# Patient Record
Sex: Female | Born: 1945 | Race: Asian | Hispanic: No | Smoking: Never smoker
Health system: Southern US, Community
[De-identification: ages and names within clinical notes are randomized; demographics above are authoritative.]

## PROBLEM LIST (undated history)

## (undated) DIAGNOSIS — E079 Disorder of thyroid, unspecified: Secondary | ICD-10-CM

## (undated) DIAGNOSIS — C801 Malignant (primary) neoplasm, unspecified: Secondary | ICD-10-CM

## (undated) DIAGNOSIS — H269 Unspecified cataract: Secondary | ICD-10-CM

## (undated) DIAGNOSIS — I10 Essential (primary) hypertension: Secondary | ICD-10-CM

## (undated) DIAGNOSIS — H409 Unspecified glaucoma: Secondary | ICD-10-CM

## (undated) DIAGNOSIS — E119 Type 2 diabetes mellitus without complications: Secondary | ICD-10-CM

## (undated) DIAGNOSIS — I4891 Unspecified atrial fibrillation: Secondary | ICD-10-CM

## (undated) HISTORY — DX: Disorder of thyroid, unspecified: E07.9

## (undated) HISTORY — DX: Unspecified glaucoma: H40.9

## (undated) HISTORY — DX: Unspecified cataract: H26.9

## (undated) HISTORY — DX: Essential (primary) hypertension: I10

## (undated) HISTORY — PX: HYSTEROSCOPY: SHX211

## (undated) HISTORY — DX: Malignant (primary) neoplasm, unspecified: C80.1

## (undated) HISTORY — PX: FOOT SURGERY: SHX648

---

## 1999-08-18 DIAGNOSIS — T148XXA Other injury of unspecified body region, initial encounter: Secondary | ICD-10-CM

## 1999-08-18 HISTORY — DX: Other injury of unspecified body region, initial encounter: T14.8XXA

## 2014-03-26 ENCOUNTER — Ambulatory Visit (INDEPENDENT_AMBULATORY_CARE_PROVIDER_SITE_OTHER): Payer: Medicare Other | Admitting: Gynecology

## 2014-03-26 ENCOUNTER — Encounter: Payer: Self-pay | Admitting: Gynecology

## 2014-03-26 ENCOUNTER — Other Ambulatory Visit (HOSPITAL_COMMUNITY)
Admission: RE | Admit: 2014-03-26 | Discharge: 2014-03-26 | Disposition: A | Discharge status: Home / Self Care | Payer: Medicare Other | Source: Ambulatory Visit | Attending: Gynecology | Admitting: Gynecology

## 2014-03-26 VITALS — BP 140/90 | Ht 59.75 in | Wt 234.0 lb

## 2014-03-26 DIAGNOSIS — E663 Overweight: Secondary | ICD-10-CM

## 2014-03-26 DIAGNOSIS — N840 Polyp of corpus uteri: Secondary | ICD-10-CM

## 2014-03-26 DIAGNOSIS — Z124 Encounter for screening for malignant neoplasm of cervix: Secondary | ICD-10-CM | POA: Insufficient documentation

## 2014-03-26 DIAGNOSIS — R87619 Unspecified abnormal cytological findings in specimens from cervix uteri: Secondary | ICD-10-CM | POA: Insufficient documentation

## 2014-03-26 DIAGNOSIS — Z1151 Encounter for screening for human papillomavirus (HPV): Secondary | ICD-10-CM | POA: Diagnosis present

## 2014-03-26 DIAGNOSIS — Z87410 Personal history of cervical dysplasia: Secondary | ICD-10-CM | POA: Insufficient documentation

## 2014-03-26 DIAGNOSIS — N950 Postmenopausal bleeding: Secondary | ICD-10-CM | POA: Insufficient documentation

## 2014-03-26 NOTE — Progress Notes (Signed)
Crystal Ashley 01-Apr-1946 [id number]   History:    68 y.o.  who is new to the practice. Patient is from [place] and there appears to be language barrier. She did bring some lab work with her. It appears that in 2014 with [place] as a result of a Pap smear done on February 2014 had demonstrated atypical glandular cells not otherwise specified are present, favor endometrial origin was the pathological description. Patient had had an ultrasound as well because of postmenopausal bleeding and reported stated that she had a thickened endometrium 1.5 cm thick. Both ovaries were not visualized and there was no adnexal masses. Her gynecologist took her to the operating room on 11/09/2012 whereby patient underwent a fractional dilatation and curettage along with hysteroscopy and removal of endometrial polyp. Pathology report as follows:  Endocervical scrapings: Scant amount of mucus containing 2 benign-appearing O. cells and occasional submucosal tissue fragment Endometrial polyp: Small benign endometrial polyp Endometrial scrapings: Complex endometrial hyperplasia without atypia and squamous Memorial's and areas of lytic change and hemorrhage and the final comment was that there was extensive complex hyperplasia in the curettings but did not see any definite malignant change.  Patient has had no further followup and is here because she has postmenopausal bleeding at times for the past year. She travels from here to [place] and to [place]. She stated that she had her blood work done they are but her mammogram was normal this year and [place]. She has never had a colonoscopy. Patient has never had any children in the past.  It appears from her medication list that her primary physician is treating her for her hypertension as well as for hypothyroidism.  Past medical history,surgical history, family history and social history were all reviewed and documented in the EPIC chart.  Gynecologic  History No LMP recorded. Patient is postmenopausal. Contraception: post menopausal status Last Pap: See above. Results were: See above Last mammogram: Normal. Results were: Patient reports normal report from this year  Obstetric History OB History  Gravida Para Term Preterm AB SAB TAB Ectopic Multiple Living  0                  ROS: A ROS was performed and pertinent positives and negatives are included in the history.  GENERAL: No fevers or chills. HEENT: No change in vision, no earache, sore throat or sinus congestion. NECK: No pain or stiffness. CARDIOVASCULAR: No chest pain or pressure. No palpitations. PULMONARY: No shortness of breath, cough or wheeze. GASTROINTESTINAL: No abdominal pain, nausea, vomiting or diarrhea, melena or bright red blood per rectum. GENITOURINARY: No urinary frequency, urgency, hesitancy or dysuria. MUSCULOSKELETAL: No joint or muscle pain, no back pain, no recent trauma. DERMATOLOGIC: No rash, no itching, no lesions. ENDOCRINE: No polyuria, polydipsia, no heat or cold intolerance. No recent change in weight. HEMATOLOGICAL: Vaginal bleeding NEUROLOGIC: No headache, seizures, numbness, tingling or weakness. PSYCHIATRIC: No depression, no loss of interest in normal activity or change in sleep pattern.     Exam: chaperone present  BP 140/90  Ht 4' 11.75" (1.518 m)  Wt 234 lb (106.142 kg)  BMI 46.06 kg/m2  Body mass index is 46.06 kg/(m^2).  General appearance : Morbidly obese No acute distress HEENT: Neck supple, trachea midline, no carotid bruits, no thyroidmegaly Lungs: Clear to auscultation, no rhonchi or wheezes, or rib retractions  Heart: Regular rate and rhythm, no murmurs or gallops Breast:Examined in sitting and supine position were symmetrical in appearance, no palpable  masses or tenderness,  no skin retraction, no nipple inversion, no nipple discharge, no skin discoloration, no axillary or supraclavicular lymphadenopathy Abdomen: Protuberant  abdomen  Extremities: no edema or skin discoloration or tenderness  Pelvic:  Bartholin, Urethra, Skene Glands: Within normal limits             Vagina: No gross lesions or discharge  Cervix: No gross lesions or discharge  Uterus  difficult to assess due to abdominal girth  Adnexa  same as above  Anus and perineum  normal   Rectovaginal  normal sphincter tone without palpated masses or tenderness             Hemoccult not done     Assessment/Plan:  68 y.o. female morbidly obese who last year and [place] had been taken to the operating room for fractional dilatation and curettage with hysteroscopy and removal of endometrial polyp. Preop evaluation had consisted of an ultrasound demonstrated endometrial thickness and Pap smear having demonstrated atypical glandular cells of undetermined significance. The pathology report from the [facility] had demonstrated small benign endometrial polyp along with complex endometrial hyperplasia without atypia. Patient had no followup until now. A Pap smear was done today as well as an endometrial biopsy. Patient returned back to the office next week for sonohysterogram. She was reminded of the importance to schedule a colonoscopy this year.   Note: This dictation was prepared with  Dragon/digital dictation along withSmart phrase technology. Any transcriptional errors that result from this process are unintentional.   Terrance Mass MD, 4:14 PM 03/26/2014

## 2014-03-26 NOTE — Addendum Note (Signed)
Addended by: Thurnell Garbe A on: 03/26/2014 04:28 PM   Modules accepted: Orders

## 2014-03-26 NOTE — Patient Instructions (Signed)
Endometrial Biopsy Endometrial biopsy is a procedure in which a tissue sample is taken from inside the uterus. The tissue sample is then looked at under a microscope to see if the tissue is normal or abnormal. The endometrium is the lining of the uterus. This procedure helps determine where you are in your menstrual cycle and how hormone levels are affecting the lining of the uterus. This procedure may also be used to evaluate uterine bleeding or to diagnose endometrial cancer, tuberculosis, polyps, or inflammatory conditions.  LET YOUR HEALTH CARE PROVIDER KNOW ABOUT:  Any allergies you have.  All medicines you are taking, including vitamins, herbs, eye drops, creams, and over-the-counter medicines.  Previous problems you or members of your family have had with the use of anesthetics.  Any blood disorders you have.  Previous surgeries you have had.  Medical conditions you have.  Possibility of pregnancy. RISKS AND COMPLICATIONS Generally, this is a safe procedure. However, as with any procedure, complications can occur. Possible complications include:  Bleeding.  Pelvic infection.  Puncture of the uterine wall with the biopsy device (rare). BEFORE THE PROCEDURE   Keep a record of your menstrual cycles as directed by your health care provider. You may need to schedule your procedure for a specific time in your cycle.  You may want to bring a sanitary pad to wear home after the procedure.  Arrange for someone to drive you home after the procedure if you will be given a medicine to help you relax (sedative). PROCEDURE   You may be given a sedative to relax you.  You will lie on an exam table with your feet and legs supported as in a pelvic exam.  Your health care provider will insert an instrument (speculum) into your vagina to see your cervix.  Your cervix will be cleansed with an antiseptic solution. A medicine (local anesthetic) will be used to numb the cervix.  A forceps  instrument (tenaculum) will be used to hold your cervix steady for the biopsy.  A thin, rodlike instrument (uterine sound) will be inserted through your cervix to determine the length of your uterus and the location where the biopsy sample will be removed.  A thin, flexible tube (catheter) will be inserted through your cervix and into the uterus. The catheter is used to collect the biopsy sample from your endometrial tissue.  The catheter and speculum will then be removed, and the tissue sample will be sent to a lab for examination. AFTER THE PROCEDURE  You will rest in a recovery area until you are ready to go home.  You may have mild cramping and a small amount of vaginal bleeding for a few days after the procedure. This is normal.  Make sure you find out how to get your test results. Document Released: 12/04/2004 Document Revised: 04/05/2013 Document Reviewed: 01/18/2013 ExitCare Patient Information 2015 [employer]. This information is not intended to replace advice given to you by your health care provider. Make sure you discuss any questions you have with your health care provider. Transvaginal Ultrasound Transvaginal ultrasound is a pelvic ultrasound, using a metal probe that is placed in the vagina, to look at a women's female organs. Transvaginal ultrasound is a method of seeing inside the pelvis of a woman. The ultrasound machine sends out sound waves from the transducer (probe). These sound waves bounce off body structures (like an echo) to create a picture. The picture shows up on a monitor. It is called transvaginal because the probe is   inserted into the vagina. There should be very little discomfort from the vaginal probe. This test can also be used during pregnancy. Endovaginal ultrasound is another name for a transvaginal ultrasound. In a transabdominal ultrasound, the probe is placed on the outside of the belly. This method gives pictures that are lower quality than pictures  from the transvaginal technique. Transvaginal ultrasound is used to look for problems of the female genital tract. Some such problems include:  Infertility problems.  Congenital (birth defect) malformations of the uterus and ovaries.  Tumors in the uterus.  Abnormal bleeding.  Ovarian tumors and cysts.  Abscess (inflamed tissue around pus) in the pelvis.  Unexplained abdominal or pelvic pain.  Pelvic infection. DURING PREGNANCY, TRANSVAGINAL ULTRASOUND MAY BE USED TO LOOK AT:  Normal pregnancy.  Ectopic pregnancy (pregnancy outside the uterus).  Fetal heartbeat.  Abnormalities in the pelvis, that are not seen well with transabdominal ultrasound.  Suspected twins or multiples.  Impending miscarriage.  Problems with the cervix (incompetent cervix, not able to stay closed and hold the baby).  When doing an amniocentesis (removing fluid from the pregnancy sac, for testing).  Looking for abnormalities of the baby.  Checking the growth, development, and age of the fetus.  Measuring the amount of fluid in the amniotic sac.  When doing an external version of the baby (moving baby into correct position).  Evaluating the baby for problems in high risk pregnancies (biophysical profile).  Suspected fetal demise (death). Sometimes a special ultrasound method called Saline Infusion Sonography (SIS) is used for a more accurate look at the uterus. Sterile saline (salt water) is injected into the uterus of non-pregnant patients to see the inside of the uterus better. SIS is not used on pregnant women. The vaginal probe can also assist in obtaining biopsies of abnormal areas, in draining fluid from cysts on the ovary, and in finding IUDs (intrauterine device, birth control) that cannot be located. PREPARATION FOR TEST A transvaginal ultrasound is done with the bladder empty. The transabdominal ultrasound is done with your bladder full. You may be asked to drink several glasses of water  before that exam. Sometimes, a transabdominal ultrasound is done just after a transvaginal ultrasound, to look at organs in your abdomen. PROCEDURE  You will lie down on a table, with your knees bent and your feet in foot holders. The probe is covered with a condom. A sterile lubricant is put into the vagina and on the probe. The lubricant helps transmit the sound waves and avoid irritating the vagina. Your caregiver will move the probe inside the vaginal cavity to scan the pelvic structures. A normal test will show a normal pelvis and normal contents. An abnormal test will show abnormalities of the pelvis, placenta, or baby. ABNORMAL RESULTS MAY BE DUE TO:  Growths or tumors in the:  Uterus.  Ovaries.  Vagina.  Other pelvic structures.  Non-cancerous growths of the uterus and ovaries.  Twisting of the ovary, cutting off blood supply to the ovary (ovarian torsion).  Areas of infection, including:  Pelvic inflammatory disease.  Abscess in the pelvis.  Locating an IUD. PROBLEMS FOUND IN PREGNANT WOMEN MAY INCLUDE:  Ectopic pregnancy (pregnancy outside the uterus).  Multiple pregnancies.  Early dilation (opening) of the cervix. This may indicate an incompetent cervix and early delivery.  Impending miscarriage.  Fetal death.  Problems with the placenta, including:  Placenta has grown over the opening of the womb (placenta previa).  Placenta has separated early in the womb (placental   abruption).  Placenta grows into the muscle of the uterus (placenta accreta).  Tumors of pregnancy, including gestational trophoblastic disease. This is an abnormal pregnancy, with no fetus. The uterus is filled with many grape-like cysts that could sometimes be cancerous.  Incorrect position of the fetus (breech, vertex).  Intrauterine fetal growth retardation (IUGR) (poor growth in the womb).  Fetal abnormalities or infection. RISKS AND COMPLICATIONS There are no known risks to the  ultrasound procedure. There is no X-ray used when doing an ultrasound. Document Released: 07/15/2004 Document Revised: 10/26/2011 Document Reviewed: 07/03/2009 ExitCare Patient Information 2015 [employer]. This information is not intended to replace advice given to you by your health care provider. Make sure you discuss any questions you have with your health care provider.  

## 2014-03-28 LAB — CYTOLOGY - PAP

## 2014-03-29 ENCOUNTER — Telehealth: Payer: Self-pay | Admitting: Gynecology

## 2014-03-29 ENCOUNTER — Telehealth: Payer: Self-pay | Admitting: *Deleted

## 2014-03-29 NOTE — Telephone Encounter (Signed)
Appointment on 03/30/14 @ 8:15 am with Dr.Clarke-Pearson at [facility]. Left message for pt to call.

## 2014-03-29 NOTE — Telephone Encounter (Signed)
Message copied by Thamas Jaegers on Thu Mar 29, 2014 10:58 AM ------      Message from: Terrance Mass      Created: Thu Mar 29, 2014  9:48 AM       Anderson Malta please contact GYN oncology group for consultation for this patient with endometrial cancer. The patient was previously scheduled to come back to the office for a sonohysterogram please cancel that. I have spoken with her husband who is a retired cardiologist today since the patient was sleeping and gave them the information and explained to her and that you would be calling to coordinate the consultation with the GYN oncologist.            Patient's husband is a retired Film/video editor and I was able to give him in detail explanation of the findings. Patient before the pathology report came back was going to be scheduled for a sonohysterogram since she has had history of benign endometrial polyps in the past as well as history of extensive complex hyperplasia without atypia. We will cancel that appointment and make arrangements for her to see our GYN oncologist from [facility] to schedule consultation for definitive surgery such as with robotic total hysterectomy with bilateral salpingo-oophorectomy and staging procedure. ------

## 2014-03-29 NOTE — Telephone Encounter (Signed)
Spoke with patient's husband today since she was still sleeping in reference to patient's recent endometrial biopsy that was done in the office on August 10 as a result of postmenopausal bleeding. 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 Diagnosis Note These findings correlate with those of the pap smear ([id number]). Dr. Saralyn Pilar has reviewed the case. Dr. Toney Rakes was notified 03/29/2014.  Patient's husband is a retired Film/video editor and I was able to give him in detail explanation of the findings. Patient before the pathology report came back was going to be scheduled for a sonohysterogram since she has had history of benign endometrial polyps in the past as well as history of extensive complex hyperplasia without atypia. We will cancel that appointment and make arrangements for her to see our GYN oncologist from [facility] to schedule consultation for definitive surgery such as with robotic total hysterectomy with bilateral salpingo-oophorectomy and staging procedure.

## 2014-03-29 NOTE — Telephone Encounter (Signed)
Pt husband informed with the below note.

## 2014-03-30 ENCOUNTER — Telehealth: Payer: Self-pay | Admitting: *Deleted

## 2014-03-30 ENCOUNTER — Ambulatory Visit: Payer: Medicare Other | Attending: Gynecology | Admitting: Gynecology

## 2014-03-30 ENCOUNTER — Encounter: Payer: Self-pay | Admitting: Gynecology

## 2014-03-30 VITALS — BP 150/83 | HR 69 | Temp 98.7°F | Resp 20 | Ht 59.75 in | Wt 231.9 lb

## 2014-03-30 DIAGNOSIS — C549 Malignant neoplasm of corpus uteri, unspecified: Secondary | ICD-10-CM

## 2014-03-30 DIAGNOSIS — Z79899 Other long term (current) drug therapy: Secondary | ICD-10-CM | POA: Insufficient documentation

## 2014-03-30 DIAGNOSIS — I10 Essential (primary) hypertension: Secondary | ICD-10-CM | POA: Diagnosis not present

## 2014-03-30 DIAGNOSIS — N898 Other specified noninflammatory disorders of vagina: Secondary | ICD-10-CM | POA: Diagnosis present

## 2014-03-30 DIAGNOSIS — Z8542 Personal history of malignant neoplasm of other parts of uterus: Secondary | ICD-10-CM | POA: Insufficient documentation

## 2014-03-30 DIAGNOSIS — C541 Malignant neoplasm of endometrium: Secondary | ICD-10-CM

## 2014-03-30 DIAGNOSIS — E039 Hypothyroidism, unspecified: Secondary | ICD-10-CM | POA: Diagnosis not present

## 2014-03-30 NOTE — Telephone Encounter (Signed)
Called pt with [facility] surgical date sept 3 with Dr. Clarene Essex. Pt advised Husband spoke with Gwen at [facility] and has a pre op appt as well. No further concerns.

## 2014-03-30 NOTE — Progress Notes (Signed)
Consult Note: Gyn-Onc   Crystal Ashley 68 y.o. female  Chief Complaint  Patient presents with  . Vaginal Bleeding    Referring physician: Uvaldo Rising, M.D.  Assessment : grade 1 endometrial carcinoma. The natural history and her pubic options were discussed with the patient and her husband. They would recommend initial therapy with a robotic hysterectomy bilateral salpingo-oophorectomy and intraoperative frozen section which would provide guidance as to whether lymphadenectomy would be appropriate.  Plan: A robotic hysterectomy will be scheduled at [facility] as the patient and her husband wish to proceed with surgery sooner than we're able to perform adhering [facility].  HPI: 69 year old married female gravida 0 seen in consultation request of Dr. Uvaldo Rising regarding management of a newly diagnosed grade 1 endometrial adenocarcinoma.  The patient has had intermittent postmenopausal bleeding for the last several years. Previously in 2014 she underwent a fractional D&C which revealed an endometrial polyp and complex hyperplasia without atypia. She then presented to Dr. Toney Rakes on 03/26/2014 with continued bleeding. An endometrial biopsy was performed at that time revealing a grade 1 endometrial carcinoma. The patient denies any past gynecologic history. She currently denies any pelvic pain pressure or other pelvic symptoms. She denies any GI or GU symptoms.  Medical history is relevant for hypertension and hypothyroidism she's never had any abdominal surgery.  It's noted that the patient's husband is retired Film/video editor. They own a [employer] in [place] of Systems:10 point review of systems is negative except as noted in interval history.   Vitals: Blood pressure 150/83, pulse 69, temperature 98.7 F (37.1 C), temperature source Oral, resp. rate 20, height 4' 11.75" (1.518 m), weight 231 lb 14.4 oz (105.189 kg).  Physical Exam: General : The patient is a  healthy woman in no acute distress. Moderately obese. HEENT: normocephalic, extraoccular movements normal; neck is supple without thyromegally  Lynphnodes: Supraclavicular and inguinal nodes not enlarged  Abdomen: Soft, non-tender, no ascites, no organomegally, no masses, no hernias  Pelvic:  EGBUS: Normal female  Vagina: Normal, no lesions  Urethra and Bladder: Normal, non-tender  Cervix: Normal. No lesions are noted. Uterus: Difficult to assess given the patient's habitus. I do not get any impression that is enlarged, however. Bi-manual examination: Non-tender; no adenxal masses or nodularity  Rectal: normal sphincter tone, no masses, no blood  Lower extremities: No edema or varicosities. Normal range of motion      No Known Allergies  Past Medical History  Diagnosis Date  . Hypertension     Past Surgical History  Procedure Laterality Date  . Hysteroscopy      polypectomy  . Foot surgery Left     Current Outpatient Prescriptions  Medication Sig Dispense Refill  . amLODipine (NORVASC) 5 MG tablet Take 5 mg by mouth daily.      Marland Kitchen atenolol (TENORMIN) 50 MG tablet Take 50 mg by mouth daily.      . cholecalciferol (VITAMIN D) 1000 UNITS tablet Take 1,000 Units by mouth daily.      . furosemide (LASIX) 40 MG tablet Take 40 mg by mouth.      . levothyroxine (SYNTHROID) 25 MCG tablet Take 25 mcg by mouth daily before breakfast.      . valsartan (DIOVAN) 160 MG tablet Take 160 mg by mouth daily.       No current facility-administered medications for this visit.    History   Social History  . Marital Status: Married    Spouse Name: N/A  Number of Children: N/A  . Years of Education: N/A   Occupational History  . Not on file.   Social History Main Topics  . Smoking status: Never Smoker   . Smokeless tobacco: Never Used  . Alcohol Use: No  . Drug Use: Not on file  . Sexual Activity: Yes   Other Topics Concern  . Not on file   Social History Narrative  . No  narrative on file    Family History  Problem Relation Age of Onset  . Diabetes Son       Alvino Chapel, MD 03/30/2014, 10:17 AM

## 2014-04-19 DIAGNOSIS — I10 Essential (primary) hypertension: Secondary | ICD-10-CM

## 2014-04-19 DIAGNOSIS — E039 Hypothyroidism, unspecified: Secondary | ICD-10-CM | POA: Insufficient documentation

## 2014-04-19 DIAGNOSIS — I152 Hypertension secondary to endocrine disorders: Secondary | ICD-10-CM | POA: Insufficient documentation

## 2014-04-19 DIAGNOSIS — E119 Type 2 diabetes mellitus without complications: Secondary | ICD-10-CM | POA: Insufficient documentation

## 2014-04-19 DIAGNOSIS — E1159 Type 2 diabetes mellitus with other circulatory complications: Secondary | ICD-10-CM | POA: Insufficient documentation

## 2014-04-25 ENCOUNTER — Other Ambulatory Visit: Payer: Medicare Other

## 2014-04-25 ENCOUNTER — Ambulatory Visit: Payer: Medicare Other | Admitting: Gynecology

## 2014-05-28 ENCOUNTER — Other Ambulatory Visit: Payer: Medicare Other

## 2014-05-28 ENCOUNTER — Ambulatory Visit: Payer: Medicare Other | Admitting: Gynecology

## 2014-08-13 ENCOUNTER — Ambulatory Visit: Payer: Medicare Other | Admitting: Gynecology

## 2014-08-23 DIAGNOSIS — R609 Edema, unspecified: Secondary | ICD-10-CM | POA: Diagnosis not present

## 2014-08-23 DIAGNOSIS — E559 Vitamin D deficiency, unspecified: Secondary | ICD-10-CM | POA: Diagnosis not present

## 2014-08-23 DIAGNOSIS — E785 Hyperlipidemia, unspecified: Secondary | ICD-10-CM | POA: Diagnosis not present

## 2014-08-23 DIAGNOSIS — I10 Essential (primary) hypertension: Secondary | ICD-10-CM | POA: Diagnosis not present

## 2014-08-23 DIAGNOSIS — E039 Hypothyroidism, unspecified: Secondary | ICD-10-CM | POA: Diagnosis not present

## 2014-08-31 ENCOUNTER — Encounter: Payer: Self-pay | Admitting: Gynecologic Oncology

## 2014-08-31 ENCOUNTER — Ambulatory Visit: Payer: Medicare Other | Attending: Gynecologic Oncology | Admitting: Gynecologic Oncology

## 2014-08-31 VITALS — BP 162/71 | HR 77 | Temp 98.4°F | Resp 18 | Ht 59.75 in | Wt 233.1 lb

## 2014-08-31 DIAGNOSIS — C541 Malignant neoplasm of endometrium: Secondary | ICD-10-CM | POA: Diagnosis not present

## 2014-08-31 DIAGNOSIS — Z79899 Other long term (current) drug therapy: Secondary | ICD-10-CM | POA: Diagnosis not present

## 2014-08-31 DIAGNOSIS — Z8542 Personal history of malignant neoplasm of other parts of uterus: Secondary | ICD-10-CM | POA: Diagnosis not present

## 2014-08-31 DIAGNOSIS — I10 Essential (primary) hypertension: Secondary | ICD-10-CM | POA: Diagnosis not present

## 2014-08-31 NOTE — Progress Notes (Signed)
ENDOMETRIAL CANCER FOLLOWUP  HPI:  Crystal Ashley is a 69 y.o. year old G0P0 initially seen in consultation on 03/30/14 referred by Dr Uvaldo Rising for grade 1 endometrial cancer.  She then underwent a robotic hysterectomy, BSO, pelvic lymphadenectomy on 04/20/14 with Dr Jannifer Rodney in [facility] without complications.  Her postoperative course was uncomplicated.  Her final pathologic diagnosis is a Stage IA Grade 1 endometrioid endometrial cancer with no lymphovascular space invasion, no myometrial invasion and negative lymph nodes.  She is seen today for a surveillance visit.  She has normal appetite, normal bowel and bladder function, and no pain. She has no other complaints today. She denies symptoms concerning for recurrence including: vaginal bleeding, pelvic pain or pressure, abdominal pain, change in bladder or bowel habit, new lower extremity edema, cough, early satiety, nausea or emesis).  No Known Allergies  Current Outpatient Prescriptions on File Prior to Visit  Medication Sig Dispense Refill  . amLODipine (NORVASC) 5 MG tablet Take 5 mg by mouth daily.    Marland Kitchen atenolol (TENORMIN) 50 MG tablet Take 50 mg by mouth daily.    . cholecalciferol (VITAMIN D) 1000 UNITS tablet Take 1,000 Units by mouth daily.    . furosemide (LASIX) 40 MG tablet Take 40 mg by mouth daily.     . valsartan (DIOVAN) 160 MG tablet Take 160 mg by mouth daily.     No current facility-administered medications on file prior to visit.    Past Medical History  Diagnosis Date  . Hypertension     Past Surgical History  Procedure Laterality Date  . Hysteroscopy      polypectomy  . Foot surgery Left   hysterectomy, bso, pelvic lymphadenectomy 04/2014 for endometrial cancer  History   Social History  . Marital Status: Married    Spouse Name: N/A    Number of Children: N/A  . Years of Education: N/A   Occupational History  . Not on file.   Social History Main Topics  . Smoking status: Never Smoker   .  Smokeless tobacco: Never Used  . Alcohol Use: No  . Drug Use: Not on file  . Sexual Activity: Yes   Other Topics Concern  . Not on file   Social History Narrative    Family History  Problem Relation Age of Onset  . Diabetes Son      Review of systems: Constitutional:  She has no weight gain or weight loss. She has no fever or chills. Eyes: No blurred vision Ears, Nose, Mouth, Throat: No dizziness, headaches or changes in hearing. No mouth sores. Cardiovascular: No chest pain, palpitations or edema. Respiratory:  No shortness of breath, wheezing or cough Gastrointestinal: She has normal bowel movements without diarrhea or constipation. She denies any nausea or vomiting. She denies blood in her stool or heart burn. Genitourinary:  She denies pelvic pain, pelvic pressure or changes in her urinary function. She has no hematuria, dysuria, or incontinence. She has no irregular vaginal bleeding or vaginal discharge Musculoskeletal: Denies muscle weakness or joint pains.  Skin:  She has no skin changes, rashes or itching Neurological:  Denies dizziness or headaches. No neuropathy, no numbness or tingling. Psychiatric:  She denies depression or anxiety. Hematologic/Lymphatic:   No easy bruising or bleeding   Physical Exam: Blood pressure 162/71, pulse 77, temperature 98.4 F (36.9 C), temperature source Oral, resp. rate 18, height 4' 11.75" (1.518 m), weight 233 lb 1.6 oz (105.733 kg). General: Well dressed, well nourished in no apparent  distress.   HEENT:  Normocephalic and atraumatic, no lesions.  Extraocular muscles intact. Sclerae anicteric. Pupils equal, round, reactive. No mouth sores or ulcers. Thyroid is normal size, not nodular, midline. Skin:  No lesions or rashes. Breasts:  Soft, symmetric.  No skin or nipple changes.  No palpable LN or masses. Lungs:  Clear to auscultation bilaterally.  No wheezes. Cardiovascular:  Regular rate and rhythm.  No murmurs or rubs. Abdomen:   Soft, nontender, nondistended.  No palpable masses.  No hepatosplenomegaly.  No ascites. Normal bowel sounds.  No hernias.  Incisions are well healed  Genitourinary: Normal EGBUS  Vaginal cuff intact.  No bleeding or discharge.  No cul de sac fullness. Extremities: No cyanosis, clubbing or edema.  No calf tenderness or erythema. No palpable cords. Psychiatric: Mood and affect are appropriate. Neurological: Awake, alert and oriented x 3. Sensation is intact, no neuropathy.  Musculoskeletal: No pain, normal strength and range of motion.  Assessment:    69 y.o. year old with Stage IA Grade 1 endometrioid endometrial cancer.   S/p robotic hysterectomy and staging on 04/20/14. No  LVSI, no myometrial invasion, negative pelvic washings and negative lymph nodes.   Plan: 1) NED today 2) Treatment counseling - Very low risk (<5%) for recurrence given age, grade, depth of myometrial invasion and LVSI status. Multidisciplinary tumor board recommendation is for routine surveillance with frequent pelvic exams and visits with annual pap smear.  We recommend examinations every 6 months for 4 more years, at which time she can return to annual visits.  Discussed signs and symptoms of recurrence including vaginal bleeding or discharge, leg pain or swelling and changes in bowel or bladder habits. She was given the opportunity to ask questions, which were answered to her satisfaction, and she is agreement with the above mentioned plan of care.  3)  Return to clinic in 6 months to see Dr Toney Rakes, and in 1 year to see [place] in the cancer center.  Donaciano Eva, MD

## 2014-08-31 NOTE — Patient Instructions (Addendum)
Followup with Dr. Toney Rakes in July 2016   Followup with Dr. Denman George in January 2017. Please call GYN oncology clinic (phone number: [phone]) in November to schedule this appointment please.  Please call us sooner if you have any questions or concerns.

## 2014-09-05 DIAGNOSIS — R7989 Other specified abnormal findings of blood chemistry: Secondary | ICD-10-CM | POA: Diagnosis not present

## 2014-09-05 DIAGNOSIS — E785 Hyperlipidemia, unspecified: Secondary | ICD-10-CM | POA: Diagnosis not present

## 2014-09-05 DIAGNOSIS — E039 Hypothyroidism, unspecified: Secondary | ICD-10-CM | POA: Diagnosis not present

## 2014-09-05 DIAGNOSIS — I10 Essential (primary) hypertension: Secondary | ICD-10-CM | POA: Diagnosis not present

## 2014-09-05 DIAGNOSIS — R601 Generalized edema: Secondary | ICD-10-CM | POA: Diagnosis not present

## 2014-09-05 DIAGNOSIS — E559 Vitamin D deficiency, unspecified: Secondary | ICD-10-CM | POA: Diagnosis not present

## 2014-10-25 DIAGNOSIS — I10 Essential (primary) hypertension: Secondary | ICD-10-CM | POA: Diagnosis not present

## 2014-10-25 DIAGNOSIS — E559 Vitamin D deficiency, unspecified: Secondary | ICD-10-CM | POA: Diagnosis not present

## 2014-10-25 DIAGNOSIS — E039 Hypothyroidism, unspecified: Secondary | ICD-10-CM | POA: Diagnosis not present

## 2014-10-25 DIAGNOSIS — R601 Generalized edema: Secondary | ICD-10-CM | POA: Diagnosis not present

## 2014-10-25 DIAGNOSIS — E785 Hyperlipidemia, unspecified: Secondary | ICD-10-CM | POA: Diagnosis not present

## 2014-12-25 DIAGNOSIS — E785 Hyperlipidemia, unspecified: Secondary | ICD-10-CM | POA: Diagnosis not present

## 2014-12-25 DIAGNOSIS — I10 Essential (primary) hypertension: Secondary | ICD-10-CM | POA: Diagnosis not present

## 2014-12-25 DIAGNOSIS — E559 Vitamin D deficiency, unspecified: Secondary | ICD-10-CM | POA: Diagnosis not present

## 2014-12-25 DIAGNOSIS — R601 Generalized edema: Secondary | ICD-10-CM | POA: Diagnosis not present

## 2014-12-25 DIAGNOSIS — E039 Hypothyroidism, unspecified: Secondary | ICD-10-CM | POA: Diagnosis not present

## 2015-01-15 DIAGNOSIS — H1045 Other chronic allergic conjunctivitis: Secondary | ICD-10-CM | POA: Diagnosis not present

## 2015-01-15 DIAGNOSIS — H35033 Hypertensive retinopathy, bilateral: Secondary | ICD-10-CM | POA: Diagnosis not present

## 2015-01-15 DIAGNOSIS — H2589 Other age-related cataract: Secondary | ICD-10-CM | POA: Diagnosis not present

## 2015-01-15 DIAGNOSIS — H01009 Unspecified blepharitis unspecified eye, unspecified eyelid: Secondary | ICD-10-CM | POA: Diagnosis not present

## 2015-01-15 DIAGNOSIS — I7090 Unspecified atherosclerosis: Secondary | ICD-10-CM | POA: Diagnosis not present

## 2015-01-15 DIAGNOSIS — H02839 Dermatochalasis of unspecified eye, unspecified eyelid: Secondary | ICD-10-CM | POA: Diagnosis not present

## 2015-01-29 DIAGNOSIS — J019 Acute sinusitis, unspecified: Secondary | ICD-10-CM | POA: Diagnosis not present

## 2015-01-29 DIAGNOSIS — J209 Acute bronchitis, unspecified: Secondary | ICD-10-CM | POA: Diagnosis not present

## 2015-03-05 DIAGNOSIS — E039 Hypothyroidism, unspecified: Secondary | ICD-10-CM | POA: Diagnosis not present

## 2015-03-05 DIAGNOSIS — E785 Hyperlipidemia, unspecified: Secondary | ICD-10-CM | POA: Diagnosis not present

## 2015-03-05 DIAGNOSIS — I10 Essential (primary) hypertension: Secondary | ICD-10-CM | POA: Diagnosis not present

## 2015-03-05 DIAGNOSIS — E559 Vitamin D deficiency, unspecified: Secondary | ICD-10-CM | POA: Diagnosis not present

## 2015-03-05 DIAGNOSIS — R601 Generalized edema: Secondary | ICD-10-CM | POA: Diagnosis not present

## 2015-04-05 DIAGNOSIS — I10 Essential (primary) hypertension: Secondary | ICD-10-CM | POA: Diagnosis not present

## 2015-04-05 DIAGNOSIS — L03115 Cellulitis of right lower limb: Secondary | ICD-10-CM | POA: Diagnosis not present

## 2015-04-05 DIAGNOSIS — E039 Hypothyroidism, unspecified: Secondary | ICD-10-CM | POA: Diagnosis not present

## 2015-04-05 DIAGNOSIS — L03116 Cellulitis of left lower limb: Secondary | ICD-10-CM | POA: Diagnosis not present

## 2015-04-05 DIAGNOSIS — I83819 Varicose veins of unspecified lower extremities with pain: Secondary | ICD-10-CM | POA: Diagnosis not present

## 2015-04-08 DIAGNOSIS — E669 Obesity, unspecified: Secondary | ICD-10-CM | POA: Diagnosis not present

## 2015-04-08 DIAGNOSIS — E119 Type 2 diabetes mellitus without complications: Secondary | ICD-10-CM | POA: Diagnosis not present

## 2015-04-08 DIAGNOSIS — E039 Hypothyroidism, unspecified: Secondary | ICD-10-CM | POA: Diagnosis not present

## 2015-04-08 DIAGNOSIS — I10 Essential (primary) hypertension: Secondary | ICD-10-CM | POA: Diagnosis not present

## 2015-04-08 LAB — HEMOGLOBIN A1C: Hemoglobin A1C: 6.5

## 2015-04-08 LAB — CBC AND DIFFERENTIAL
HEMATOCRIT: 41 % (ref 36–46)
Hemoglobin: 13.3 g/dL (ref 12.0–16.0)
PLATELETS: 227 10*3/uL (ref 150–399)
WBC: 7.1 10^3/mL

## 2015-04-08 LAB — BASIC METABOLIC PANEL
BUN: 15 mg/dL (ref 4–21)
Creatinine: 0.7 mg/dL (ref 0.5–1.1)
GLUCOSE: 143 mg/dL
Potassium: 4.1 mmol/L (ref 3.4–5.3)
SODIUM: 139 mmol/L (ref 137–147)

## 2015-04-08 LAB — HEPATIC FUNCTION PANEL
ALK PHOS: 80 U/L (ref 25–125)
ALT: 32 U/L (ref 7–35)
AST: 30 U/L (ref 13–35)
Bilirubin, Total: 0.5 mg/dL

## 2015-04-08 LAB — LIPID PANEL
Cholesterol: 195 mg/dL (ref 0–200)
HDL: 40 mg/dL (ref 35–70)
LDL CALC: 123 mg/dL
LDl/HDL Ratio: 3.1
Triglycerides: 161 mg/dL — AB (ref 40–160)

## 2015-04-08 LAB — TSH: TSH: 5.57 u[IU]/mL (ref <=5.90)

## 2015-04-09 DIAGNOSIS — H40053 Ocular hypertension, bilateral: Secondary | ICD-10-CM | POA: Diagnosis not present

## 2015-05-06 DIAGNOSIS — H40033 Anatomical narrow angle, bilateral: Secondary | ICD-10-CM | POA: Diagnosis not present

## 2015-05-06 DIAGNOSIS — H2513 Age-related nuclear cataract, bilateral: Secondary | ICD-10-CM | POA: Diagnosis not present

## 2015-05-06 DIAGNOSIS — H25013 Cortical age-related cataract, bilateral: Secondary | ICD-10-CM | POA: Diagnosis not present

## 2015-05-06 DIAGNOSIS — H43813 Vitreous degeneration, bilateral: Secondary | ICD-10-CM | POA: Diagnosis not present

## 2015-08-06 DIAGNOSIS — H401114 Primary open-angle glaucoma, right eye, indeterminate stage: Secondary | ICD-10-CM | POA: Diagnosis not present

## 2015-08-06 DIAGNOSIS — H401124 Primary open-angle glaucoma, left eye, indeterminate stage: Secondary | ICD-10-CM | POA: Diagnosis not present

## 2015-09-24 DIAGNOSIS — H401134 Primary open-angle glaucoma, bilateral, indeterminate stage: Secondary | ICD-10-CM | POA: Diagnosis not present

## 2015-10-23 DIAGNOSIS — H2510 Age-related nuclear cataract, unspecified eye: Secondary | ICD-10-CM | POA: Diagnosis not present

## 2015-10-23 DIAGNOSIS — I7090 Unspecified atherosclerosis: Secondary | ICD-10-CM | POA: Diagnosis not present

## 2015-10-23 DIAGNOSIS — H25043 Posterior subcapsular polar age-related cataract, bilateral: Secondary | ICD-10-CM | POA: Diagnosis not present

## 2015-10-23 DIAGNOSIS — H18419 Arcus senilis, unspecified eye: Secondary | ICD-10-CM | POA: Diagnosis not present

## 2015-10-23 DIAGNOSIS — H35033 Hypertensive retinopathy, bilateral: Secondary | ICD-10-CM | POA: Diagnosis not present

## 2015-10-23 DIAGNOSIS — H02839 Dermatochalasis of unspecified eye, unspecified eyelid: Secondary | ICD-10-CM | POA: Diagnosis not present

## 2015-10-23 DIAGNOSIS — H11433 Conjunctival hyperemia, bilateral: Secondary | ICD-10-CM | POA: Diagnosis not present

## 2015-10-23 DIAGNOSIS — H25013 Cortical age-related cataract, bilateral: Secondary | ICD-10-CM | POA: Diagnosis not present

## 2016-01-22 DIAGNOSIS — H401114 Primary open-angle glaucoma, right eye, indeterminate stage: Secondary | ICD-10-CM | POA: Diagnosis not present

## 2016-01-22 DIAGNOSIS — H401124 Primary open-angle glaucoma, left eye, indeterminate stage: Secondary | ICD-10-CM | POA: Diagnosis not present

## 2016-02-26 DIAGNOSIS — I10 Essential (primary) hypertension: Secondary | ICD-10-CM | POA: Diagnosis not present

## 2016-02-26 DIAGNOSIS — E039 Hypothyroidism, unspecified: Secondary | ICD-10-CM | POA: Diagnosis not present

## 2016-02-26 DIAGNOSIS — C55 Malignant neoplasm of uterus, part unspecified: Secondary | ICD-10-CM | POA: Diagnosis not present

## 2016-02-26 LAB — CBC AND DIFFERENTIAL
HCT: 40 % (ref 36–46)
Hemoglobin: 12.9 g/dL (ref 12.0–16.0)
Platelets: 219 10*3/uL (ref 150–399)
WBC: 7.5 10*3/mL

## 2016-02-26 LAB — BASIC METABOLIC PANEL
BUN: 10 mg/dL (ref 4–21)
GLUCOSE: 134 mg/dL
POTASSIUM: 3.9 mmol/L (ref 3.4–5.3)
SODIUM: 137 mmol/L (ref 137–147)

## 2016-02-26 LAB — HEPATIC FUNCTION PANEL: AST: 30 U/L (ref 13–35)

## 2016-06-10 DIAGNOSIS — H2513 Age-related nuclear cataract, bilateral: Secondary | ICD-10-CM | POA: Diagnosis not present

## 2016-06-10 DIAGNOSIS — H401114 Primary open-angle glaucoma, right eye, indeterminate stage: Secondary | ICD-10-CM | POA: Diagnosis not present

## 2016-06-10 DIAGNOSIS — H401124 Primary open-angle glaucoma, left eye, indeterminate stage: Secondary | ICD-10-CM | POA: Diagnosis not present

## 2016-06-10 DIAGNOSIS — H5203 Hypermetropia, bilateral: Secondary | ICD-10-CM | POA: Diagnosis not present

## 2016-12-08 DIAGNOSIS — H401134 Primary open-angle glaucoma, bilateral, indeterminate stage: Secondary | ICD-10-CM | POA: Diagnosis not present

## 2016-12-15 ENCOUNTER — Encounter: Payer: Self-pay | Admitting: Family Medicine

## 2016-12-15 ENCOUNTER — Ambulatory Visit (INDEPENDENT_AMBULATORY_CARE_PROVIDER_SITE_OTHER): Payer: Medicare Other | Admitting: Family Medicine

## 2016-12-15 VITALS — BP 144/84 | HR 64 | Temp 97.7°F | Ht 59.75 in | Wt 234.4 lb

## 2016-12-15 DIAGNOSIS — E038 Other specified hypothyroidism: Secondary | ICD-10-CM | POA: Diagnosis not present

## 2016-12-15 DIAGNOSIS — Z13220 Encounter for screening for lipoid disorders: Secondary | ICD-10-CM | POA: Diagnosis not present

## 2016-12-15 DIAGNOSIS — E559 Vitamin D deficiency, unspecified: Secondary | ICD-10-CM

## 2016-12-15 DIAGNOSIS — Z136 Encounter for screening for cardiovascular disorders: Secondary | ICD-10-CM | POA: Diagnosis not present

## 2016-12-15 DIAGNOSIS — H409 Unspecified glaucoma: Secondary | ICD-10-CM | POA: Insufficient documentation

## 2016-12-15 DIAGNOSIS — I10 Essential (primary) hypertension: Secondary | ICD-10-CM | POA: Diagnosis not present

## 2016-12-15 NOTE — Progress Notes (Signed)
Crystal Ashley is a 71 y.o. female is here to [facility].   History of Present Illness:  Crystal Ashley CMA acting as scribe for Dr. Juleen Ashley.  CC: Patient comes in today to establish care. She stated that she has hypertension, but it is controlled with the medication. Patient stated that she also has lower back pain every once in awhile when she stands to long.   Hypertension  This is a chronic problem. The current episode started more than 1 year ago. The problem has been gradually improving since onset. The problem is controlled. There are no associated agents to hypertension. There are no known risk factors for coronary artery disease. Past treatments include calcium channel blockers. The current treatment provides significant improvement.   Health Maintenance Due  Topic Date Due  . Hepatitis C Screening  1945-10-12  . TETANUS/TDAP  01/22/1965  . MAMMOGRAM  01/23/1996  . COLONOSCOPY  01/23/1996  . DEXA SCAN  01/23/2011  . PNA vac Low Risk Adult (1 of 2 - PCV13) 01/23/2011   PMHx, SurgHx, SocialHx, Medications, and Allergies were reviewed in the Visit Navigator and updated as appropriate.   Past Medical History:  Diagnosis Date  . Cancer ([place])   . Cataract   . Glaucoma   . Hypertension   . Thyroid disease    Past Surgical History:  Procedure Laterality Date  . FOOT SURGERY Left   . HYSTEROSCOPY     POLYPECTOMY   Family History  Problem Relation Age of Onset  . Diabetes Son    Social History  Substance Use Topics  . Smoking status: Never Smoker  . Smokeless tobacco: Never Used  . Alcohol use No   Current Medications and Allergies:   .  amLODipine (NORVASC) 5 MG tablet, Take 5 mg by mouth daily., Disp: , Rfl:  .  atenolol (TENORMIN) 50 MG tablet, Take 50 mg by mouth daily., Disp: , Rfl:  .  cholecalciferol (VITAMIN D) 1000 UNITS tablet, Take 1,000 Units by mouth daily., Disp: , Rfl:  .  furosemide (LASIX) 40 MG tablet, Take 40 mg by mouth daily. , Disp: ,  Rfl:  .  levothyroxine (SYNTHROID, LEVOTHROID) 75 MCG tablet, Take 75 mcg by mouth daily before breakfast. , Disp: , Rfl:  .  travoprost, benzalkonium, (TRAVATAN) 0.004 % ophthalmic solution, 1 drop at bedtime., Disp: , Rfl:  .  valsartan (DIOVAN) 160 MG tablet, Take 320 mg by mouth daily. , Disp: , Rfl:  .  timolol (TIMOPTIC) 0.5 % ophthalmic solution, , Disp: , Rfl:   No Known Allergies   Review of Systems:   Review of Systems  Constitutional: Negative for chills and fever.  HENT: Negative for congestion, ear pain, sinus pain and sore throat.   Eyes: Negative for double vision.  Respiratory: Negative for cough and wheezing.   Cardiovascular: Negative for leg swelling.  Gastrointestinal: Negative for blood in stool, constipation, diarrhea and vomiting.  Genitourinary: Negative for dysuria.  Musculoskeletal: Positive for back pain. Negative for joint pain.  Skin: Negative for itching and rash.  Neurological: Negative for dizziness.  Psychiatric/Behavioral: Negative for depression, hallucinations and memory loss.   Vitals:   Vitals:   12/15/16 1334  BP: (!) 144/84  Pulse: 64  Temp: 97.7 F (36.5 C)  TempSrc: Oral  SpO2: 98%  Weight: 234 lb 6.4 oz (106.3 kg)  Height: 4' 11.75" (1.518 m)     Body mass index is 46.16 kg/m.  Physical Exam:   Physical Exam  Constitutional: She appears well-nourished.  HENT:  Head: Normocephalic and atraumatic.  Eyes: EOM are normal. Pupils are equal, round, and reactive to light.  Neck: Normal range of motion. Neck supple.  Cardiovascular: Normal rate, regular rhythm and intact distal pulses.   Murmur heard.  Systolic murmur is present with a grade of 4/6  Pulmonary/Chest: Effort normal.  Abdominal: Soft.  Skin: Skin is warm.  Psychiatric: She has a normal mood and affect. Her behavior is normal.  Nursing note and vitals reviewed.  Assessment and Plan:   Crystal Ashley was seen today for establish care.  Diagnoses and all orders for this  visit:  Essential hypertension -     CBC -     Comprehensive metabolic panel  Other specified hypothyroidism -     TSH  Encounter for lipid screening for cardiovascular disease -     Lipid panel  Morbid obesity ([place])    . Reviewed expectations re: course of current medical issues. . Discussed self-management of symptoms. . Outlined signs and symptoms indicating need for more acute intervention. . Patient verbalized understanding and all questions were answered. . See orders for this visit as documented in the electronic medical record. . Patient received an After Visit Summary.  Records requested if needed. I spent 30 minutes with this patient, greater than 50% was face-to-face time counseling regarding the above diagnoses.  CMA served as Education administrator during this visit. History, Physical, and Plan performed by medical provider. Documentation and orders reviewed and attested to. Crystal Ashley, D.O.  Crystal Ashley, [name], [place] 12/15/2016  Future Appointments Date Time Provider [place]  12/23/2016 11:00 AM [facility] None

## 2016-12-16 ENCOUNTER — Telehealth: Payer: Self-pay

## 2016-12-16 NOTE — Addendum Note (Signed)
Addended by: Frutoso Chase A on: 12/16/2016 08:50 AM   Modules accepted: Orders

## 2016-12-16 NOTE — Telephone Encounter (Signed)
Error

## 2016-12-23 ENCOUNTER — Other Ambulatory Visit: Payer: Medicare Other

## 2016-12-23 DIAGNOSIS — E038 Other specified hypothyroidism: Secondary | ICD-10-CM

## 2016-12-23 DIAGNOSIS — Z13220 Encounter for screening for lipoid disorders: Secondary | ICD-10-CM

## 2016-12-23 DIAGNOSIS — I10 Essential (primary) hypertension: Secondary | ICD-10-CM

## 2016-12-23 DIAGNOSIS — Z136 Encounter for screening for cardiovascular disorders: Secondary | ICD-10-CM

## 2016-12-23 NOTE — Addendum Note (Signed)
Addended by: Frutoso Chase A on: 12/23/2016 09:55 AM   Modules accepted: Orders

## 2016-12-25 ENCOUNTER — Other Ambulatory Visit (INDEPENDENT_AMBULATORY_CARE_PROVIDER_SITE_OTHER): Payer: Medicare Other

## 2016-12-25 ENCOUNTER — Telehealth: Payer: Self-pay

## 2016-12-25 DIAGNOSIS — Z136 Encounter for screening for cardiovascular disorders: Secondary | ICD-10-CM

## 2016-12-25 DIAGNOSIS — E559 Vitamin D deficiency, unspecified: Secondary | ICD-10-CM | POA: Diagnosis not present

## 2016-12-25 DIAGNOSIS — Z13220 Encounter for screening for lipoid disorders: Secondary | ICD-10-CM | POA: Diagnosis not present

## 2016-12-25 DIAGNOSIS — E038 Other specified hypothyroidism: Secondary | ICD-10-CM

## 2016-12-25 DIAGNOSIS — I10 Essential (primary) hypertension: Secondary | ICD-10-CM | POA: Diagnosis not present

## 2016-12-25 LAB — LIPID PANEL
Cholesterol: 183 mg/dL (ref 0–200)
HDL: 36.5 mg/dL — ABNORMAL LOW (ref >39.00)
LDL Cholesterol: 112 mg/dL — ABNORMAL HIGH (ref 0–99)
NonHDL: 146.74
Total CHOL/HDL Ratio: 5
Triglycerides: 173 mg/dL — ABNORMAL HIGH (ref 0.0–149.0)
VLDL: 34.6 mg/dL (ref 0.0–40.0)

## 2016-12-25 LAB — CBC
HCT: 38.6 % (ref 36.0–46.0)
Hemoglobin: 12.7 g/dL (ref 12.0–15.0)
MCHC: 33 g/dL (ref 30.0–36.0)
MCV: 90 fl (ref 78.0–100.0)
Platelets: 249 10*3/uL (ref 150.0–400.0)
RBC: 4.29 Mil/uL (ref 3.87–5.11)
RDW: 16 % — ABNORMAL HIGH (ref 11.5–15.5)
WBC: 7.4 10*3/uL (ref 4.0–10.5)

## 2016-12-25 LAB — TSH: TSH: 4.97 u[IU]/mL — ABNORMAL HIGH (ref 0.35–4.50)

## 2016-12-25 LAB — COMPREHENSIVE METABOLIC PANEL
ALT: 27 U/L (ref 0–35)
AST: 25 U/L (ref 0–37)
Albumin: 3.9 g/dL (ref 3.5–5.2)
Alkaline Phosphatase: 85 U/L (ref 39–117)
BUN: 11 mg/dL (ref 6–23)
CO2: 30 mEq/L (ref 19–32)
Calcium: 9 mg/dL (ref 8.4–10.5)
Chloride: 101 mEq/L (ref 96–112)
Creatinine, Ser: 0.67 mg/dL (ref 0.40–1.20)
GFR: 92.24 mL/min (ref >60.00)
Glucose, Bld: 173 mg/dL — ABNORMAL HIGH (ref 70–99)
Potassium: 3.8 mEq/L (ref 3.5–5.1)
Sodium: 137 mEq/L (ref 135–145)
Total Bilirubin: 0.5 mg/dL (ref 0.2–1.2)
Total Protein: 7.2 g/dL (ref 6.0–8.3)

## 2016-12-25 LAB — VITAMIN D 25 HYDROXY (VIT D DEFICIENCY, FRACTURES): VITD: 40.18 ng/mL (ref 30.00–100.00)

## 2016-12-25 NOTE — Addendum Note (Signed)
Addended by: Frutoso Chase A on: 12/25/2016 11:27 AM   Modules accepted: Orders

## 2016-12-25 NOTE — Telephone Encounter (Signed)
I have added the order.

## 2016-12-25 NOTE — Telephone Encounter (Signed)
Noted! Thank you

## 2016-12-25 NOTE — Telephone Encounter (Signed)
Pt came for fasting labs this morning. During draw pt requested to also have Vitamin D checked. Please advise. Thank you.

## 2016-12-25 NOTE — Telephone Encounter (Signed)
Okay to low vitamin d.

## 2016-12-29 ENCOUNTER — Telehealth: Payer: Self-pay | Admitting: Family Medicine

## 2016-12-29 NOTE — Telephone Encounter (Signed)
ROI fax to Dr. Ledell Peoples office

## 2016-12-30 ENCOUNTER — Encounter: Payer: Self-pay | Admitting: Gynecology

## 2017-01-01 ENCOUNTER — Telehealth: Payer: Self-pay | Admitting: Family Medicine

## 2017-01-01 NOTE — Telephone Encounter (Signed)
Rec'd from Fuller Canada MD forward 7 pages to Briscoe Deutscher DO

## 2017-01-04 ENCOUNTER — Encounter: Payer: Self-pay | Admitting: Family Medicine

## 2017-01-04 ENCOUNTER — Ambulatory Visit (INDEPENDENT_AMBULATORY_CARE_PROVIDER_SITE_OTHER): Payer: Medicare Other | Admitting: Family Medicine

## 2017-01-04 ENCOUNTER — Telehealth: Payer: Self-pay | Admitting: Family Medicine

## 2017-01-04 VITALS — BP 148/76 | HR 64 | Temp 98.2°F | Ht 59.5 in | Wt 234.0 lb

## 2017-01-04 DIAGNOSIS — E7800 Pure hypercholesterolemia, unspecified: Secondary | ICD-10-CM | POA: Diagnosis not present

## 2017-01-04 DIAGNOSIS — E119 Type 2 diabetes mellitus without complications: Secondary | ICD-10-CM | POA: Diagnosis not present

## 2017-01-04 DIAGNOSIS — E785 Hyperlipidemia, unspecified: Secondary | ICD-10-CM

## 2017-01-04 DIAGNOSIS — E039 Hypothyroidism, unspecified: Secondary | ICD-10-CM

## 2017-01-04 DIAGNOSIS — E1169 Type 2 diabetes mellitus with other specified complication: Secondary | ICD-10-CM | POA: Insufficient documentation

## 2017-01-04 LAB — POCT GLYCOSYLATED HEMOGLOBIN (HGB A1C): Hemoglobin A1C: 7.4

## 2017-01-04 MED ORDER — ATORVASTATIN CALCIUM 10 MG PO TABS: 10.0000 mg | ORAL_TABLET | Freq: Every day | ORAL | 3 refills | Status: DC

## 2017-01-04 MED ORDER — METFORMIN HCL 1000 MG PO TABS: 1000.0000 mg | ORAL_TABLET | Freq: Two times a day (BID) | ORAL | 3 refills | Status: DC

## 2017-01-04 MED ORDER — LEVOTHYROXINE SODIUM 75 MCG PO TABS: 75.0000 ug | ORAL_TABLET | Freq: Every day | ORAL | 3 refills | Status: DC

## 2017-01-04 NOTE — Telephone Encounter (Signed)
Patient's husband "Maurene Capes" called in to schedule labs for his wife, however I see in the notes that she should have started a prescription due to her abnormal labs results.  Please give a call back to discuss. Also note patient has not signed DPR and [place] noted that her Cleophus Molt is not very cohesive.  Thank you,  -LL

## 2017-01-04 NOTE — Patient Instructions (Signed)
Results for orders placed or performed in visit on 01/04/17  POCT glycosylated hemoglobin (Hb A1C)  Result Value Ref Range   Hemoglobin A1C 7.4    Lab Results  Component Value Date   CHOL 183 12/25/2016   HDL 36.50 (L) 12/25/2016   LDLCALC 112 (H) 12/25/2016   TRIG 173.0 (H) 12/25/2016   CHOLHDL 5 12/25/2016   Lab Results  Component Value Date   TSH 4.97 (H) 12/25/2016

## 2017-01-04 NOTE — Telephone Encounter (Signed)
Left message for patient to return call to schedule an appointment with Dr. Juleen China to discuss lab results. Should be a 30 minute appointment due to language barrier. Patients husband called back and I tried to schedule appointment for wife to discuss labs. He wanted to schedule something for today with his appointment. I advised that we did not have any appointments available today. Patients husband said again can we do it today. I again said we have no appointments. He said bye and hung up.

## 2017-01-04 NOTE — Progress Notes (Signed)
Crystal Ashley is a 71 y.o. female is here for follow up.  History of Present Illness:  Crystal Ashley CMA acting as scribe for Dr. Juleen China.  HPI Patient is here today to discuss labs from last visit.   1. Hyperglycemia. On recent labs, with elevated triglycerides.   Lab Results  Component Value Date   HGBA1C 7.4 01/04/2017   Endocrine ROS: negative for - polydipsia/polyuria or unexpected weight changes.   2. Acquired hypothyroidism. Endocrine ROS: negative for - hair pattern changes, malaise/lethargy, palpitations, skin changes or temperature intolerance.  Lab Results  Component Value Date   TSH 4.97 (H) 12/25/2016    3. Hyperlipidemia.  Lab Results  Component Value Date   CHOL 183 12/25/2016   HDL 36.50 (L) 12/25/2016   LDLCALC 112 (H) 12/25/2016   TRIG 173.0 (H) 12/25/2016   CHOLHDL 5 12/25/2016     Health Maintenance Due  Topic Date Due  . HEMOGLOBIN A1C  1945/11/25  . Hepatitis C Screening  1946-06-19  . FOOT EXAM  01/23/1956  . OPHTHALMOLOGY EXAM  01/23/1956  . TETANUS/TDAP  01/22/1965  . MAMMOGRAM  01/23/1996  . COLONOSCOPY  01/23/1996  . DEXA SCAN  01/23/2011  . PNA vac Low Risk Adult (1 of 2 - PCV13) 01/23/2011   PMHx, SurgHx, SocialHx, FamHx, Medications, and Allergies were reviewed in the Visit Navigator and updated as appropriate.   Patient Active Problem List   Diagnosis Date Noted  . Vitamin D deficiency 12/15/2016  . Glaucoma   . Hypertension 04/19/2014  . Hypothyroidism 04/19/2014  . History of endometrial cancer 03/30/2014   Social History  Substance Use Topics  . Smoking status: Never Smoker  . Smokeless tobacco: Never Used  . Alcohol use No   Current Medications and Allergies:   Current Outpatient Prescriptions:  .  amLODipine (NORVASC) 5 MG tablet, Take 5 mg by mouth daily., Disp: , Rfl:  .  atenolol (TENORMIN) 50 MG tablet, Take 50 mg by mouth daily., Disp: , Rfl:  .  cholecalciferol (VITAMIN D) 1000 UNITS tablet, Take 1,000  Units by mouth daily., Disp: , Rfl:  .  furosemide (LASIX) 40 MG tablet, Take 40 mg by mouth daily. , Disp: , Rfl:  .  levothyroxine (SYNTHROID, LEVOTHROID) 75 MCG tablet, Take 75 mcg by mouth daily before breakfast. , Disp: , Rfl:  .  timolol (TIMOPTIC) 0.5 % ophthalmic solution, , Disp: , Rfl:  .  travoprost, benzalkonium, (TRAVATAN) 0.004 % ophthalmic solution, 1 drop at bedtime., Disp: , Rfl:  .  valsartan (DIOVAN) 160 MG tablet, Take 320 mg by mouth daily. , Disp: , Rfl:   No Known Allergies   Review of Systems   Review of Systems  Constitutional: Negative for chills, fever and malaise/fatigue.  HENT: Negative for congestion, ear pain and sore throat.   Eyes: Negative for blurred vision and double vision.  Respiratory: Negative for cough.   Cardiovascular: Negative for chest pain, palpitations and leg swelling.  Gastrointestinal: Negative for constipation, diarrhea, heartburn, nausea and vomiting.  Genitourinary: Negative for dysuria and frequency.  Neurological: Negative for dizziness.   Vitals:   Vitals:   01/04/17 1500  BP: (!) 148/76  Pulse: 64  Temp: 98.2 F (36.8 C)  TempSrc: Oral  SpO2: 97%  Weight: 234 lb (106.1 kg)  Height: 4' 11.5" (1.511 m)     Body mass index is 46.47 kg/m.   Physical Exam:   Physical Exam  Constitutional: She appears well-nourished.  HENT:  Head: Normocephalic  and atraumatic.  Eyes: EOM are normal. Pupils are equal, round, and reactive to light.  Neck: Normal range of motion. Neck supple.  Cardiovascular: Normal rate, regular rhythm and intact distal pulses.   Pulmonary/Chest: Effort normal.  Abdominal: Soft.  Skin: Skin is warm.  Psychiatric: She has a normal mood and affect. Her behavior is normal.  Nursing note and vitals reviewed.  Foot exam:   No rashes, ulcers, cuts, calluses, onychodystrophy.   Good pulses bilaterally.  Good sensation to 10 g monofilament bilaterally.  Good vibratory sensation to 128 Hz tuning fork  bilaterally.    Results for orders placed or performed in visit on 01/04/17  POCT glycosylated hemoglobin (Hb A1C)  Result Value Ref Range   Hemoglobin A1C 7.4     Assessment and Plan:   [name] was seen today for follow-up.  Diagnoses and all orders for this visit:  Type 2 diabetes mellitus without complication, without long-term current use of insulin ([place]) Comments:       New.   Assessment and Plan: 1.  Diabetes. Discussed general issues about diabetes pathophysiology and management. Counseling at today's visit: discussed the need for weight loss and discussed the advantages of a diet low in carbohydrates. Addressed ADA diet. Discussed foot care. Reminded to get yearly retinal exam. Started metformin; see  medication orders. Started statin drug see medication orders. Follow up in 3 months or as needed.. 2.  Blood Pressure. stable. 3.  Lipids.  SEE BELOW.  4.  Foot Care.  DONE TODAY. 5.  Dental Care.  DUE. 6.  Eye Care/Exam.  DUE.  Recommendations: 1.  Patient is counseled on appropriate foot care. 2.  BP goal < 130/80. 3.  LDL goal of < 100, HDL > 40 and TG < 150.  4.  Eye Exam yearly and Dental Exam every 6 months. 5.  Dietary recommendations:  ADA DIET. 6.  Physical Activity recommendations:  WATER. 7.  Pneumovax at diagnosis and once 65+. Wait five years between first dose and dose after 65.  8.  Influenza annually.   Diabetes Health Maintenance Due  Topic Date Due  . HEMOGLOBIN A1C  03[birth year]  . FOOT EXAM  01/23/1956  . OPHTHALMOLOGY EXAM  01/23/1956   Diabetes QM Metrics Latest Ref Rng & Units 01/04/2017 12/25/2016  HbA1c - 7.4 -  LDL Cacl 0 - 99 mg/dL - 112(H)  Some recent data might be hidden   BP& BMI  01/04/2017 12/15/2016  BP 148/76 144/84  BMI 46.47 46.16  Some recent data might be hidden   Orders: -     POCT glycosylated hemoglobin (Hb A1C) -     metFORMIN (GLUCOPHAGE) 1000 MG tablet; Take 1 tablet (1,000 mg total) by mouth 2 (two) times daily  with a meal.  Acquired hypothyroidism Comments: Worse. Increase Synthroid to 75 mcg (from 50 mcg) per day. We discussed about correct intake of levothyroxine, fasting, with water, separated by at least 30 minutes from breakfast, and separated by more than 4 hours from calcium, iron, multivitamins, acid reflux medications (PPIs). Recheck in 2-3 months. Orders: -     levothyroxine (SYNTHROID, LEVOTHROID) 75 MCG tablet; Take 1 tablet (75 mcg total) by mouth daily.  Pure hypercholesterolemia Comments: New. After discussion, patient would like to start below medication. Expectations, risks, and potential side effects reviewed. Will recheck labs in 3-6 months. Orders: -     atorvastatin (LIPITOR) 10 MG tablet; Take 1 tablet (10 mg total) by mouth daily.  Morbid obesity ([place]) Comments:  Chronic. The patient is asked to make an attempt to improve diet and exercise patterns to aid in medical management of this problem.   . Reviewed expectations re: course of current medical issues. . Discussed self-management of symptoms. . Outlined signs and symptoms indicating need for more acute intervention. . Patient verbalized understanding and all questions were answered. Marland Kitchen Health Maintenance issues including appropriate healthy diet, exercise, and smoking avoidance were discussed with patient. . See orders for this visit as documented in the electronic medical record. . Patient received an After Visit Summary.  CMA served as Education administrator during this visit. History, Physical, and Plan performed by medical provider. The above documentation has been reviewed and is accurate and complete. Briscoe Deutscher, D.O.  Briscoe Deutscher, DO [place], [place] 01/04/2017  No future appointments.

## 2017-01-06 ENCOUNTER — Encounter: Payer: Self-pay | Admitting: Family Medicine

## 2017-04-07 DIAGNOSIS — H2513 Age-related nuclear cataract, bilateral: Secondary | ICD-10-CM | POA: Diagnosis not present

## 2017-04-07 DIAGNOSIS — H25043 Posterior subcapsular polar age-related cataract, bilateral: Secondary | ICD-10-CM | POA: Diagnosis not present

## 2017-04-07 DIAGNOSIS — H43813 Vitreous degeneration, bilateral: Secondary | ICD-10-CM | POA: Diagnosis not present

## 2017-04-07 DIAGNOSIS — H401134 Primary open-angle glaucoma, bilateral, indeterminate stage: Secondary | ICD-10-CM | POA: Diagnosis not present

## 2017-04-23 NOTE — Progress Notes (Deleted)
Pre visit review using our clinic review tool, if applicable. No additional management support is needed unless otherwise documented below in the visit note. 

## 2017-04-23 NOTE — Progress Notes (Deleted)
PCP notes:   Health maintenance: Hep C Screening Opthalmology Exam Tdap Mammogram Colonoscopy Dexa Scan PCV 13   Abnormal screenings:    Patient concerns:    Nurse concerns:   Next PCP appt:

## 2017-04-23 NOTE — Progress Notes (Deleted)
Subjective:   Crystal Ashley is a 71 y.o. female who presents for an Initial Medicare Annual Wellness Visit.  Review of Systems    No ROS.  Medicare Wellness Visit. Additional risk factors are reflected in the social history.         Objective:    There were no vitals filed for this visit. There is no height or weight on file to calculate BMI.   Current Medications (verified) Outpatient Encounter Prescriptions as of 04/29/2017  Medication Sig  . amLODipine (NORVASC) 5 MG tablet Take 5 mg by mouth daily.  Marland Kitchen atenolol (TENORMIN) 50 MG tablet Take 50 mg by mouth daily.  Marland Kitchen atorvastatin (LIPITOR) 10 MG tablet Take 1 tablet (10 mg total) by mouth daily.  . cholecalciferol (VITAMIN D) 1000 UNITS tablet Take 1,000 Units by mouth daily.  . furosemide (LASIX) 40 MG tablet Take 40 mg by mouth daily.   Marland Kitchen levothyroxine (SYNTHROID, LEVOTHROID) 75 MCG tablet Take 1 tablet (75 mcg total) by mouth daily.  . metFORMIN (GLUCOPHAGE) 1000 MG tablet Take 1 tablet (1,000 mg total) by mouth 2 (two) times daily with a meal.  . timolol (TIMOPTIC) 0.5 % ophthalmic solution   . travoprost, benzalkonium, (TRAVATAN) 0.004 % ophthalmic solution 1 drop at bedtime.  . valsartan (DIOVAN) 160 MG tablet Take 320 mg by mouth daily.    No facility-administered encounter medications on file as of 04/29/2017.     Allergies (verified) Patient has no known allergies.   History: Past Medical History:  Diagnosis Date  . Cancer ([place])   . Cataract   . Glaucoma   . Hypertension   . Thyroid disease    Past Surgical History:  Procedure Laterality Date  . FOOT SURGERY Left   . HYSTEROSCOPY     POLYPECTOMY   Family History  Problem Relation Age of Onset  . Diabetes Son    Social History   Occupational History  . Not on file.   Social History Main Topics  . Smoking status: Never Smoker  . Smokeless tobacco: Never Used  . Alcohol use No  . Drug use: Unknown  . Sexual activity: Yes    Tobacco  Counseling Counseling given: Not Answered   Activities of Daily Living No flowsheet data found.  Immunizations and Health Maintenance  There is no immunization history on file for this patient. Health Maintenance Due  Topic Date Due  . Hepatitis C Screening  January 25, 1946  . OPHTHALMOLOGY EXAM  01/23/1956  . TETANUS/TDAP  01/22/1965  . MAMMOGRAM  01/23/1996  . COLONOSCOPY  01/23/1996  . DEXA SCAN  01/23/2011  . PNA vac Low Risk Adult (1 of 2 - PCV13) 01/23/2011  . INFLUENZA VACCINE  03/17/2017    Patient Care Team: Briscoe Deutscher, DO as PCP - General (Family Medicine)  Indicate any recent Medical Services you may have received from other than [facility] providers in the past year (date may be approximate).     Assessment:   This is a routine wellness examination for Crystal Ashley. Physical assessment deferred to PCP.   Hearing/Vision screen No exam data present  Dietary issues and exercise activities discussed:    Goals    None     Depression Screen PHQ 2/9 Scores 12/15/2016  PHQ - 2 Score 0    Fall Risk Fall Risk  12/15/2016  Falls in the past year? No    Cognitive Function:        Screening Tests Health Maintenance  Topic Date Due  .  Hepatitis C Screening  19-Feb-1946  . OPHTHALMOLOGY EXAM  01/23/1956  . TETANUS/TDAP  01/22/1965  . MAMMOGRAM  01/23/1996  . COLONOSCOPY  01/23/1996  . DEXA SCAN  01/23/2011  . PNA vac Low Risk Adult (1 of 2 - PCV13) 01/23/2011  . INFLUENZA VACCINE  03/17/2017  . HEMOGLOBIN A1C  07/07/2017  . FOOT EXAM  01/04/2018      Plan:   Follow up with PCP as directed.  I have personally reviewed and noted the following in the patient's chart:   . Medical and social history . Use of alcohol, tobacco or illicit drugs  . Current medications and supplements . Functional ability and status . Nutritional status . Physical activity . Advanced directives . List of other physicians . Vitals . Screenings to include cognitive, depression, and  falls . Referrals and appointments  In addition, I have reviewed and discussed with patient certain preventive protocols, quality metrics, and best practice recommendations. A written personalized care plan for preventive services as well as general preventive health recommendations were provided to patient.     Ree Edman, RN   04/23/2017

## 2017-04-29 ENCOUNTER — Ambulatory Visit: Payer: Medicare Other | Admitting: *Deleted

## 2017-04-29 ENCOUNTER — Other Ambulatory Visit: Payer: Medicare Other

## 2017-05-04 NOTE — Progress Notes (Signed)
Subjective:   Crystal Ashley is a 71 y.o. female who presents for an Initial Medicare Annual Wellness Visit.  Review of Systems    No ROS.  Medicare Wellness Visit. Additional risk factors are reflected in the social history.  Cardiac Risk Factors include: advanced age (>38men, >2 women);diabetes mellitus;obesity (BMI >30kg/m2)     Objective:    Today's Vitals   05/05/17 1127  BP: 130/82  Pulse: 80  Resp: 16  SpO2: 98%  Weight: 220 lb 6.4 oz (100 kg)  Height: 5' (1.524 m)   Body mass index is 43.04 kg/m.   Current Medications (verified) Outpatient Encounter Prescriptions as of 05/05/2017  Medication Sig  . amLODipine (NORVASC) 5 MG tablet Take 5 mg by mouth daily.  Marland Kitchen atenolol (TENORMIN) 50 MG tablet Take 50 mg by mouth daily.  Marland Kitchen atorvastatin (LIPITOR) 10 MG tablet Take 1 tablet (10 mg total) by mouth daily.  . cholecalciferol (VITAMIN D) 1000 UNITS tablet Take 5,000 Units by mouth every other day.   . furosemide (LASIX) 40 MG tablet Take 40 mg by mouth daily.   Marland Kitchen levothyroxine (SYNTHROID, LEVOTHROID) 75 MCG tablet Take 1 tablet (75 mcg total) by mouth daily.  . metFORMIN (GLUCOPHAGE) 1000 MG tablet Take 1 tablet (1,000 mg total) by mouth 2 (two) times daily with a meal.  . timolol (TIMOPTIC) 0.5 % ophthalmic solution   . travoprost, benzalkonium, (TRAVATAN) 0.004 % ophthalmic solution 1 drop at bedtime.  . valsartan (DIOVAN) 160 MG tablet Take 320 mg by mouth daily.    No facility-administered encounter medications on file as of 05/05/2017.     Allergies (verified) Patient has no known allergies.   History: Past Medical History:  Diagnosis Date  . Cancer ([place])   . Cataract   . Glaucoma   . Hypertension   . Thyroid disease    Past Surgical History:  Procedure Laterality Date  . FOOT SURGERY Left   . HYSTEROSCOPY     POLYPECTOMY   Family History  Problem Relation Age of Onset  . Diabetes Son    Social History   Occupational History  . Not on  file.   Social History Main Topics  . Smoking status: Never Smoker  . Smokeless tobacco: Never Used  . Alcohol use No  . Drug use: Unknown  . Sexual activity: Yes    Tobacco Counseling Counseling given: Not Answered   Activities of Daily Living In your present state of health, do you have any difficulty performing the following activities: 05/05/2017  Hearing? N  Vision? N  Difficulty concentrating or making decisions? N  Walking or climbing stairs? N  Dressing or bathing? N  Doing errands, shopping? N  Preparing Food and eating ? N  Using the Toilet? N  In the past six months, have you accidently leaked urine? N  Do you have problems with loss of bowel control? N  Managing your Medications? N  Managing your Finances? N  Housekeeping or managing your Housekeeping? N  Some recent data might be hidden    Immunizations and Health Maintenance  There is no immunization history on file for this patient. Health Maintenance Due  Topic Date Due  . Hepatitis C Screening  1945-08-21  . OPHTHALMOLOGY EXAM  01/23/1956    Patient Care Team: Briscoe Deutscher, DO as PCP - General (Family Medicine)  Indicate any recent Medical Services you may have received from other than [facility] providers in the past year (date may be approximate).  Assessment:   This is a routine wellness examination for Crystal Ashley. Physical assessment deferred to PCP.   Hearing/Vision screen  Hearing Screening   125Hz  250Hz  500Hz  1000Hz  2000Hz  3000Hz  4000Hz  6000Hz  8000Hz   Right ear:   0 0 40  40    Left ear:   0 0 40  40    Vision Screening Comments: 03/2017, Tobie Lords.  Dietary issues and exercise activities discussed: Current Exercise Habits: The patient does not participate in regular exercise at present, Exercise limited by: None identified  Goals    . Weight (lb) < 200 lb (90.7 kg)      Depression Screen PHQ 2/9 Scores 05/05/2017 12/15/2016  PHQ - 2 Score 0 0    Fall Risk Fall Risk  05/05/2017  12/15/2016  Falls in the past year? No No    Cognitive Function: Ad8 score reviewed for issues:  Issues making decisions:no  Less interest in hobbies / activities:no  Repeats questions, stories (family complaining): no  Trouble using ordinary gadgets (microwave, computer, phone):no  Forgets the month or year:  no  Mismanaging finances: no  Remembering appts:no  Daily problems with thinking and/or memory:no Ad8 score is=0        Screening Tests Health Maintenance  Topic Date Due  . Hepatitis C Screening  20-Sep-1945  . OPHTHALMOLOGY EXAM  01/23/1956  . INFLUENZA VACCINE  05/05/2018 (Originally 03/17/2017)  . MAMMOGRAM  05/05/2018 (Originally 01/23/1996)  . DEXA SCAN  05/05/2018 (Originally 01/23/2011)  . COLONOSCOPY  05/05/2018 (Originally 01/23/1996)  . TETANUS/TDAP  05/05/2018 (Originally 01/22/1965)  . PNA vac Low Risk Adult (1 of 2 - PCV13) 05/05/2018 (Originally 01/23/2011)  . HEMOGLOBIN A1C  07/07/2017  . FOOT EXAM  01/04/2018      Plan:   Follow up with PCP as directed.  I have personally reviewed and noted the following in the patient's chart:   . Medical and social history . Use of alcohol, tobacco or illicit drugs  . Current medications and supplements . Functional ability and status . Nutritional status . Physical activity . Advanced directives . List of other physicians . Vitals . Screenings to include cognitive, depression, and falls . Referrals and appointments  In addition, I have reviewed and discussed with patient certain preventive protocols, quality metrics, and best practice recommendations. A written personalized care plan for preventive services as well as general preventive health recommendations were provided to patient.     Ree Edman, RN   05/05/2017

## 2017-05-04 NOTE — Progress Notes (Signed)
PCP notes:   Health maintenance: Hepatitis C Screening: with next lab draw. Opthalmology Exam: 04/07/2017 Tobie Lords, records requested from office.  Tdap:  Will check with insurance.  Mammogram: Declines. Colonoscopy: Declines. Dexa scan: Declines. PCV 13: Declines. Flu: Declines.   Abnormal screenings: None.   Patient concerns: None.   Nurse concerns: None.   Next PCP appt: 06/07/17 2:45.

## 2017-05-04 NOTE — Progress Notes (Signed)
Pre visit review using our clinic review tool, if applicable. No additional management support is needed unless otherwise documented below in the visit note. 

## 2017-05-05 ENCOUNTER — Encounter: Payer: Self-pay | Admitting: *Deleted

## 2017-05-05 ENCOUNTER — Other Ambulatory Visit: Payer: Self-pay | Admitting: Surgical

## 2017-05-05 ENCOUNTER — Ambulatory Visit (INDEPENDENT_AMBULATORY_CARE_PROVIDER_SITE_OTHER): Payer: Medicare Other | Admitting: *Deleted

## 2017-05-05 ENCOUNTER — Other Ambulatory Visit (INDEPENDENT_AMBULATORY_CARE_PROVIDER_SITE_OTHER): Payer: Medicare Other

## 2017-05-05 VITALS — BP 130/82 | HR 80 | Resp 16 | Ht 60.0 in | Wt 220.4 lb

## 2017-05-05 DIAGNOSIS — E559 Vitamin D deficiency, unspecified: Secondary | ICD-10-CM | POA: Diagnosis not present

## 2017-05-05 DIAGNOSIS — R739 Hyperglycemia, unspecified: Secondary | ICD-10-CM

## 2017-05-05 DIAGNOSIS — Z0000 Encounter for general adult medical examination without abnormal findings: Secondary | ICD-10-CM | POA: Diagnosis not present

## 2017-05-05 DIAGNOSIS — R5383 Other fatigue: Secondary | ICD-10-CM | POA: Diagnosis not present

## 2017-05-05 DIAGNOSIS — I10 Essential (primary) hypertension: Secondary | ICD-10-CM

## 2017-05-05 DIAGNOSIS — R7989 Other specified abnormal findings of blood chemistry: Secondary | ICD-10-CM

## 2017-05-05 LAB — LIPID PANEL
Cholesterol: 110 mg/dL (ref 0–200)
HDL: 40 mg/dL (ref >39.00)
LDL Cholesterol: 41 mg/dL (ref 0–99)
NonHDL: 70.04
Total CHOL/HDL Ratio: 3
Triglycerides: 147 mg/dL (ref 0.0–149.0)
VLDL: 29.4 mg/dL (ref 0.0–40.0)

## 2017-05-05 LAB — COMPREHENSIVE METABOLIC PANEL
ALT: 17 U/L (ref 0–35)
AST: 16 U/L (ref 0–37)
Albumin: 4 g/dL (ref 3.5–5.2)
Alkaline Phosphatase: 83 U/L (ref 39–117)
BUN: 16 mg/dL (ref 6–23)
CO2: 31 mEq/L (ref 19–32)
Calcium: 9 mg/dL (ref 8.4–10.5)
Chloride: 96 mEq/L (ref 96–112)
Creatinine, Ser: 0.88 mg/dL (ref 0.40–1.20)
GFR: 67.27 mL/min (ref >60.00)
Glucose, Bld: 168 mg/dL — ABNORMAL HIGH (ref 70–99)
Potassium: 3.5 mEq/L (ref 3.5–5.1)
Sodium: 137 mEq/L (ref 135–145)
Total Bilirubin: 1 mg/dL (ref 0.2–1.2)
Total Protein: 7 g/dL (ref 6.0–8.3)

## 2017-05-05 LAB — MAGNESIUM: Magnesium: 1.6 mg/dL (ref 1.5–2.5)

## 2017-05-05 LAB — VITAMIN D 25 HYDROXY (VIT D DEFICIENCY, FRACTURES): VITD: 55.94 ng/mL (ref 30.00–100.00)

## 2017-05-05 LAB — HEMOGLOBIN A1C: Hgb A1c MFr Bld: 6.7 % — ABNORMAL HIGH (ref 4.6–6.5)

## 2017-05-05 NOTE — Patient Instructions (Addendum)
Crystal Ashley , Thank you for taking time to come for your Medicare Wellness Visit. I appreciate your ongoing commitment to your health goals. Please review the following plan we discussed and let me know if I can assist you in the future.   These are the goals we discussed: Goals    . Weight (lb) < 200 lb (90.7 kg)       This is a list of the screening recommended for you and due dates:  Health Maintenance  Topic Date Due  .  Hepatitis C: One time screening is recommended by Center for Disease Control  (CDC) for  adults born from [birth year] through 1965.   10/17/1945  . Eye exam for diabetics  01/23/1956  . Tetanus Vaccine  01/22/1965  . Mammogram  01/23/1996  . Colon Cancer Screening  01/23/1996  . DEXA scan (bone density measurement)  01/23/2011  . Pneumonia vaccines (1 of 2 - PCV13) 01/23/2011  . Flu Shot  03/17/2017  . Hemoglobin A1C  07/07/2017  . Complete foot exam   01/04/2018   Preventive Care for Adults  A healthy lifestyle and preventive care can promote health and wellness. Preventive health guidelines for adults include the following key practices.  . A routine yearly physical is a good way to check with your health care provider about your health and preventive screening. It is a chance to share any concerns and updates on your health and to receive a thorough exam.  . Visit your dentist for a routine exam and preventive care every 6 months. Brush your teeth twice a day and floss once a day. Good oral hygiene prevents tooth decay and gum disease.  . The frequency of eye exams is based on your age, health, family medical history, use  of contact lenses, and other factors. Follow your health care provider's ecommendations for frequency of eye exams.  . Eat a healthy diet. Foods like vegetables, fruits, whole grains, low-fat dairy products, and lean protein foods contain the nutrients you need without too many calories. Decrease your intake of foods high in solid fats, added  sugars, and salt. Eat the right amount of calories for you. Get information about a proper diet from your health care provider, if necessary.  . Regular physical exercise is one of the most important things you can do for your health. Most adults should get at least 150 minutes of moderate-intensity exercise (any activity that increases your heart rate and causes you to sweat) each week. In addition, most adults need muscle-strengthening exercises on 2 or more days a week.  Silver Sneakers may be a benefit available to you. To determine eligibility, you may visit the website: www.silversneakers.com or contact program at [phone] Mon-Fri between 8AM-8PM.   . Maintain a healthy weight. The body mass index (BMI) is a screening tool to identify possible weight problems. It provides an estimate of body fat based on height and weight. Your health care provider can find your BMI and can help you achieve or maintain a healthy weight.   For adults 20 years and older: ? A BMI below 18.5 is considered underweight. ? A BMI of 18.5 to 24.9 is normal. ? A BMI of 25 to 29.9 is considered overweight. ? A BMI of 30 and above is considered obese.   . Maintain normal blood lipids and cholesterol levels by exercising and minimizing your intake of saturated fat. Eat a balanced diet with plenty of fruit and vegetables. Blood tests for lipids and cholesterol  should begin at age 40 and be repeated every 5 years. If your lipid or cholesterol levels are high, you are over 50, or you are at high risk for heart disease, you may need your cholesterol levels checked more frequently. Ongoing high lipid and cholesterol levels should be treated with medicines if diet and exercise are not working.  . If you smoke, find out from your health care provider how to quit. If you do not use tobacco, please do not start.  . If you choose to drink alcohol, please do not consume more than 2 drinks per day. One drink is considered to  be 12 ounces (355 mL) of beer, 5 ounces (148 mL) of wine, or 1.5 ounces (44 mL) of liquor.  . If you are 24-81 years old, ask your health care provider if you should take aspirin to prevent strokes.  . Use sunscreen. Apply sunscreen liberally and repeatedly throughout the day. You should seek shade when your shadow is shorter than you. Protect yourself by wearing long sleeves, pants, a wide-brimmed hat, and sunglasses year round, whenever you are outdoors.  . Once a month, do a whole body skin exam, using a mirror to look at the skin on your back. Tell your health care provider of new moles, moles that have irregular borders, moles that are larger than a pencil eraser, or moles that have changed in shape or color.

## 2017-05-05 NOTE — Progress Notes (Signed)
I have personally reviewed the Medicare Annual Wellness questionnaire and have noted 1. The patient's medical and social history 2. Their use of alcohol, tobacco or illicit drugs 3. Their current medications and supplements 4. The patient's functional ability including ADL's, fall risks, home safety risks and hearing or visual impairment. 5. Diet and physical activities 6. Evidence for depression or mood disorders 7. Reviewed Updated provider list, see scanned forms and [facility] Snapshot.   The patients weight, height, BMI and visual acuity have been recorded in the chart I have made referrals, counseling and provided education to the patient based review of the above and I have provided the pt with a written personalized care plan for preventive services.  I have provided the patient with a copy of your personalized plan for preventive services. Instructed to take the time to review along with their updated medication list.   Tacori Kvamme, D.O. Family [facility]  

## 2017-06-07 ENCOUNTER — Ambulatory Visit (INDEPENDENT_AMBULATORY_CARE_PROVIDER_SITE_OTHER): Payer: Medicare Other | Admitting: Family Medicine

## 2017-06-07 ENCOUNTER — Encounter: Payer: Self-pay | Admitting: Family Medicine

## 2017-06-07 VITALS — BP 108/84 | HR 66 | Temp 98.4°F | Ht 60.0 in | Wt 219.2 lb

## 2017-06-07 DIAGNOSIS — Z23 Encounter for immunization: Secondary | ICD-10-CM | POA: Diagnosis not present

## 2017-06-07 DIAGNOSIS — E119 Type 2 diabetes mellitus without complications: Secondary | ICD-10-CM

## 2017-06-07 DIAGNOSIS — Z9189 Other specified personal risk factors, not elsewhere classified: Secondary | ICD-10-CM

## 2017-06-07 DIAGNOSIS — I10 Essential (primary) hypertension: Secondary | ICD-10-CM | POA: Diagnosis not present

## 2017-06-07 DIAGNOSIS — Z1159 Encounter for screening for other viral diseases: Secondary | ICD-10-CM

## 2017-06-11 NOTE — Progress Notes (Signed)
Crystal Ashley is a 71 y.o. female is here for follow up.  History of Present Illness:   HPI:  Current symptoms: no polyuria or polydipsia, no chest pain, dyspnea or TIA's, no numbness, tingling or pain in extremities.  Taking medication compliantly without noted sided effects [x]   YES  []   NO   Maintaining a diabetic diet? [x]   YES  []   NO Trying to exercise on a regular basis? [x]   YES  []   NO  On ACE inhibitor or angiotensin II receptor blocker? [x]   YES  []   NO On Aspirin? []   YES  [x]   NO  Lab Results  Component Value Date   HGBA1C 6.7 (H) 05/05/2017    No results found for: Derl Barrow   Lab Results  Component Value Date   CHOL 110 05/05/2017   HDL 40.00 05/05/2017   LDLCALC 41 05/05/2017   TRIG 147.0 05/05/2017   CHOLHDL 3 05/05/2017     Wt Readings from Last 3 Encounters:  06/07/17 219 lb 3.2 oz (99.4 kg)  05/05/17 220 lb 6.4 oz (100 kg)  01/04/17 234 lb (106.1 kg)   BP Readings from Last 3 Encounters:  06/07/17 108/84  05/05/17 130/82  01/04/17 (!) 148/76   Lab Results  Component Value Date   CREATININE 0.88 05/05/2017   Lab Results  Component Value Date   CHOL 110 05/05/2017   HDL 40.00 05/05/2017   LDLCALC 41 05/05/2017   TRIG 147.0 05/05/2017   CHOLHDL 3 05/05/2017   Hypertension Home blood pressure readings at goal.  Avoiding excessive salt intake? [x]   YES  []   NO Trying to exercise on a regular basis? [x]   YES  []   NO Review: taking medications as instructed, no medication side effects noted, no TIAs, no chest pain on exertion, no dyspnea on exertion, no swelling of ankles.   Health Maintenance Due  Topic Date Due  . Hepatitis C Screening  09-04-1945   Depression screen [facility] 2/9 05/05/2017 12/15/2016  Decreased Interest 0 0  Down, Depressed, Hopeless 0 0  PHQ - 2 Score 0 0   PMHx, SurgHx, SocialHx, FamHx, Medications, and Allergies were reviewed in the Visit Navigator and updated as appropriate.   Patient Active Problem  List   Diagnosis Date Noted  . Morbid obesity ([place]) 01/04/2017  . Pure hypercholesterolemia 01/04/2017  . Vitamin D deficiency 12/15/2016  . Glaucoma   . Hypertension 04/19/2014  . Hypothyroidism 04/19/2014  . History of endometrial cancer 03/30/2014   Social History  Substance Use Topics  . Smoking status: Never Smoker  . Smokeless tobacco: Never Used  . Alcohol use No   Current Medications and Allergies:   Current Outpatient Prescriptions:  .  amLODipine-valsartan (EXFORGE) 10-320 MG tablet, Take 1 tablet by mouth every morning., Disp: , Rfl:  .  atenolol (TENORMIN) 50 MG tablet, Take 50 mg by mouth daily., Disp: , Rfl:  .  atorvastatin (LIPITOR) 10 MG tablet, Take 1 tablet (10 mg total) by mouth daily., Disp: 90 tablet, Rfl: 3 .  cholecalciferol (VITAMIN D) 1000 UNITS tablet, Take 5,000 Units by mouth every other day. , Disp: , Rfl:  .  furosemide (LASIX) 40 MG tablet, Take 40 mg by mouth daily. , Disp: , Rfl:  .  levothyroxine (SYNTHROID, LEVOTHROID) 75 MCG tablet, Take 1 tablet (75 mcg total) by mouth daily., Disp: 90 tablet, Rfl: 3 .  metFORMIN (GLUCOPHAGE) 1000 MG tablet, Take 1 tablet (1,000 mg total) by mouth 2 (two)  times daily with a meal., Disp: 180 tablet, Rfl: 3 .  timolol (TIMOPTIC) 0.5 % ophthalmic solution, Place 1 drop into both eyes 2 (two) times daily. , Disp: , Rfl:  .  travoprost, benzalkonium, (TRAVATAN) 0.004 % ophthalmic solution, 1 drop at bedtime., Disp: , Rfl:  .  valsartan (DIOVAN) 160 MG tablet, Take 320 mg by mouth daily. , Disp: , Rfl:   No Known Allergies   Review of Systems   Pertinent items are noted in the HPI. Otherwise, ROS is negative.  Vitals:   Vitals:   06/07/17 1453  BP: 108/84  Pulse: 66  Temp: 98.4 F (36.9 C)  TempSrc: Oral  SpO2: 100%  Weight: 219 lb 3.2 oz (99.4 kg)  Height: 5' (1.524 m)     Body mass index is 42.81 kg/m.   Physical Exam:   Physical Exam  Constitutional: She appears well-nourished.  HENT:  Head:  Normocephalic and atraumatic.  Eyes: Pupils are equal, round, and reactive to light. EOM are normal.  Neck: Normal range of motion. Neck supple.  Cardiovascular: Normal rate, regular rhythm, normal heart sounds and intact distal pulses.   Pulmonary/Chest: Effort normal.  Abdominal: Soft.  Skin: Skin is warm.  Psychiatric: She has a normal mood and affect. Her behavior is normal.  Nursing note and vitals reviewed.   Diabetic Foot Exam - Simple   Simple Foot Form Diabetic Foot exam was performed with the following findings:  Yes 06/11/2017 11:05 PM  Visual Inspection No deformities, no ulcerations, no other skin breakdown bilaterally:  Yes Sensation Testing Intact to touch and monofilament testing bilaterally:  Yes Pulse Check Posterior Tibialis and Dorsalis pulse intact bilaterally:  Yes Comments     Results for orders placed or performed in visit on 05/05/17  Comprehensive metabolic panel  Result Value Ref Range   Sodium 137 135 - 145 mEq/L   Potassium 3.5 3.5 - 5.1 mEq/L   Chloride 96 96 - 112 mEq/L   CO2 31 19 - 32 mEq/L   Glucose, Bld 168 (H) 70 - 99 mg/dL   BUN 16 6 - 23 mg/dL   Creatinine, Ser 0.88 0.40 - 1.20 mg/dL   Total Bilirubin 1.0 0.2 - 1.2 mg/dL   Alkaline Phosphatase 83 39 - 117 U/L   AST 16 0 - 37 U/L   ALT 17 0 - 35 U/L   Total Protein 7.0 6.0 - 8.3 g/dL   Albumin 4.0 3.5 - 5.2 g/dL   Calcium 9.0 8.4 - 10.5 mg/dL   GFR 67.27 >60.00 mL/min  Lipid panel  Result Value Ref Range   Cholesterol 110 0 - 200 mg/dL   Triglycerides 147.0 0.0 - 149.0 mg/dL   HDL 40.00 >39.00 mg/dL   VLDL 29.4 0.0 - 40.0 mg/dL   LDL Cholesterol 41 0 - 99 mg/dL   Total CHOL/HDL Ratio 3    NonHDL 70.04   Hemoglobin A1c  Result Value Ref Range   Hgb A1c MFr Bld 6.7 (H) 4.6 - 6.5 %  VITAMIN D 25 Hydroxy (Vit-D Deficiency, Fractures)  Result Value Ref Range   VITD 55.94 30.00 - 100.00 ng/mL  Magnesium  Result Value Ref Range   Magnesium 1.6 1.5 - 2.5 mg/dL   Assessment and  Plan:   Yelena was seen today for annual exam.  Diagnoses and all orders for this visit:  Type 2 diabetes mellitus without complication, without long-term current use of insulin ([place]) Comments: At goal.  Need for prophylactic vaccination and inoculation against  influenza -     Flu vaccine HIGH DOSE PF  Essential hypertension Comments: At goal.  Morbid obesity ([place]) Comments: Doing well. Losing weight.   . Reviewed expectations re: course of current medical issues. . Discussed self-management of symptoms. . Outlined signs and symptoms indicating need for more acute intervention. . Patient verbalized understanding and all questions were answered. Marland Kitchen Health Maintenance issues including appropriate healthy diet, exercise, and smoking avoidance were discussed with patient. . See orders for this visit as documented in the electronic medical record. . Patient received an After Visit Summary.  Briscoe Deutscher, DO [place], [facility] 06/11/2017

## 2017-06-30 ENCOUNTER — Telehealth: Payer: Self-pay | Admitting: Family Medicine

## 2017-06-30 NOTE — Telephone Encounter (Signed)
MEDICATION:  atenolol (TENORMIN) 50 MG tablet  PHARMACY:   [facility] mail order pharmacy   IS THIS A 90 DAY SUPPLY : Y  IS PATIENT OUT OF MEDICATION: N  IF NOT; HOW MUCH IS LEFT: few days.   LAST APPOINTMENT DATE: @10 /22/2018  NEXT APPOINTMENT DATE:@Visit  date not found  OTHER COMMENTS:    **Let patient know to contact pharmacy at the end of the day to make sure medication is ready. **  ** Please notify patient to allow 48-72 hours to process**  **Encourage patient to contact the pharmacy for refills or they can request refills through [facility]**

## 2017-06-30 NOTE — Telephone Encounter (Signed)
Okay refill. 

## 2017-06-30 NOTE — Telephone Encounter (Signed)
Dr Juleen China has not prescribed before. Please advise.

## 2017-07-01 MED ORDER — ATENOLOL 50 MG PO TABS: 50.0000 mg | ORAL_TABLET | Freq: Every day | ORAL | 1 refills | Status: DC

## 2017-07-01 NOTE — Telephone Encounter (Signed)
RX sent to pharmacy  

## 2017-07-20 ENCOUNTER — Telehealth: Payer: Self-pay | Admitting: Family Medicine

## 2017-07-20 NOTE — Telephone Encounter (Signed)
Copied from [place] [phone]. Topic: Quick Communication - See Telephone Encounter >> Jul 20, 2017  3:11 PM Boyd Kerbs wrote: CRM for notification. See Telephone encounter for:  Patient is requesting 90 day refill for Amlodopine.  Sending this to [facility] mail order.  Pt ID #[id number] [employer] [phone]  07/20/17.

## 2017-07-21 ENCOUNTER — Other Ambulatory Visit: Payer: Self-pay | Admitting: *Deleted

## 2017-07-21 MED ORDER — AMLODIPINE BESYLATE-VALSARTAN 10-320 MG PO TABS: 1.0000 | ORAL_TABLET | Freq: Every morning | ORAL | 0 refills | Status: DC

## 2017-07-22 ENCOUNTER — Other Ambulatory Visit: Payer: Self-pay | Admitting: *Deleted

## 2017-07-22 MED ORDER — AMLODIPINE BESYLATE-VALSARTAN 10-320 MG PO TABS: 1.0000 | ORAL_TABLET | Freq: Every morning | ORAL | 0 refills | Status: DC

## 2017-08-02 DIAGNOSIS — H401134 Primary open-angle glaucoma, bilateral, indeterminate stage: Secondary | ICD-10-CM | POA: Diagnosis not present

## 2017-10-05 DIAGNOSIS — H401134 Primary open-angle glaucoma, bilateral, indeterminate stage: Secondary | ICD-10-CM | POA: Diagnosis not present

## 2017-10-19 ENCOUNTER — Telehealth: Payer: Self-pay | Admitting: Family Medicine

## 2017-10-19 DIAGNOSIS — E7800 Pure hypercholesterolemia, unspecified: Secondary | ICD-10-CM

## 2017-10-19 NOTE — Telephone Encounter (Signed)
Copied from [place]. Topic: Quick Communication - Rx Refill/Question >> Oct 19, 2017 11:30 AM Waylan Rocher, Lumin L wrote: Medication: atenolol (TENORMIN) 50 MG tablet + atorvastatin (LIPITOR) 10 MG tablet (90 day supply)   Has the patient contacted their pharmacy? Yes.     (Agent: If no, request that the patient contact the pharmacy for the refill.)   Preferred Pharmacy (with phone number or street name): [place], [place]: Please be advised that RX refills may take up to 3 business days. We ask that you follow-up with your pharmacy.

## 2017-10-20 MED ORDER — ATORVASTATIN CALCIUM 10 MG PO TABS: 10.0000 mg | ORAL_TABLET | Freq: Every day | ORAL | 0 refills | Status: DC

## 2017-10-20 MED ORDER — ATENOLOL 50 MG PO TABS: 50.0000 mg | ORAL_TABLET | Freq: Every day | ORAL | 0 refills | Status: DC

## 2017-10-26 ENCOUNTER — Telehealth: Payer: Self-pay | Admitting: Family Medicine

## 2017-10-26 NOTE — Telephone Encounter (Signed)
See note

## 2017-10-26 NOTE — Telephone Encounter (Signed)
Copied from [place] [phone]. Topic: Quick Communication - Rx Refill/Question >> Oct 26, 2017 11:53 AM Carolyn Stare wrote: Medication    furosemide (LASIX) 40 MG tablet  The above med  was RX by someone else and is asking if Dr Juleen China will RX this MED      PT PHONE NUMBER [address] [facility]     Agent: Please be advised that RX refills may take up to 3 business days. We ask that you follow-up with your pharmacy.

## 2017-10-26 NOTE — Telephone Encounter (Signed)
Please advise on refill. Historical provider.  

## 2017-10-26 NOTE — Telephone Encounter (Signed)
Okay refill. 

## 2017-10-26 NOTE — Telephone Encounter (Signed)
See pt. Request. Thanks. 

## 2017-10-27 MED ORDER — FUROSEMIDE 40 MG PO TABS: 40.0000 mg | ORAL_TABLET | Freq: Every day | ORAL | 3 refills | Status: DC

## 2017-10-27 NOTE — Telephone Encounter (Signed)
RX sent to pharmacy  

## 2017-11-05 ENCOUNTER — Other Ambulatory Visit: Payer: Self-pay

## 2017-11-05 MED ORDER — FUROSEMIDE 40 MG PO TABS: 40.0000 mg | ORAL_TABLET | Freq: Every day | ORAL | 0 refills | Status: DC

## 2017-11-09 ENCOUNTER — Encounter: Payer: Self-pay | Admitting: Family Medicine

## 2017-11-09 ENCOUNTER — Ambulatory Visit (INDEPENDENT_AMBULATORY_CARE_PROVIDER_SITE_OTHER): Payer: Medicare Other | Admitting: Family Medicine

## 2017-11-09 VITALS — BP 128/84 | HR 75 | Temp 98.0°F | Ht 60.0 in | Wt 218.0 lb

## 2017-11-09 DIAGNOSIS — E119 Type 2 diabetes mellitus without complications: Secondary | ICD-10-CM | POA: Diagnosis not present

## 2017-11-09 DIAGNOSIS — E7800 Pure hypercholesterolemia, unspecified: Secondary | ICD-10-CM | POA: Diagnosis not present

## 2017-11-09 LAB — POCT GLYCOSYLATED HEMOGLOBIN (HGB A1C): Hemoglobin A1C: 6.7

## 2017-11-11 ENCOUNTER — Encounter: Payer: Self-pay | Admitting: Family Medicine

## 2017-11-11 NOTE — Progress Notes (Signed)
Crystal Ashley is a 72 y.o. female is here for follow up.  History of Present Illness:   HPI:   1. Type 2 diabetes mellitus without complication, without long-term current use of insulin ([place]).  Current symptoms: no polyuria or polydipsia, no chest pain, dyspnea or TIA's, no numbness, tingling or pain in extremities.  Taking medication compliantly without noted sided effects [x]   YES  []   NO  Episodes of hypoglycemia? []   YES  [x]   NO Maintaining a diabetic diet? [x]   YES  []   NO Trying to exercise on a regular basis? []   YES  [x]   NO  On ACE inhibitor or angiotensin II receptor blocker? [x]   YES  []   NO On Aspirin? [x]   YES  []   NO  Lab Results  Component Value Date   HGBA1C 6.7 11/09/2017    No results found for: Crystal Ashley  Lab Results  Component Value Date   CHOL 110 05/05/2017   HDL 40.00 05/05/2017   LDLCALC 41 05/05/2017   TRIG 147.0 05/05/2017   CHOLHDL 3 05/05/2017     Wt Readings from Last 3 Encounters:  11/09/17 218 lb (98.9 kg)  06/07/17 219 lb 3.2 oz (99.4 kg)  05/05/17 220 lb 6.4 oz (100 kg)   BP Readings from Last 3 Encounters:  11/09/17 128/84  06/07/17 108/84  05/05/17 130/82   Lab Results  Component Value Date   CREATININE 0.88 05/05/2017    2. Pure hypercholesterolemia.  Is the patient taking medications without problems? [x]   YES  []   NO Does the patient complain of muscle aches?   []   YES  [x]    NO Trying to exercise on a regular basis? []   YES  [x]   NO Diet Compliance: compliant most of the time. Cardiovascular ROS: no chest pain or dyspnea on exertion.   Lipids:    Component Value Date/Time   CHOL 110 05/05/2017 0951   TRIG 147.0 05/05/2017 0951   HDL 40.00 05/05/2017 0951   VLDL 29.4 05/05/2017 0951   CHOLHDL 3 05/05/2017 0951    3. Morbid obesity ([place]).   Wt Readings from Last 3 Encounters:  11/09/17 218 lb (98.9 kg)  06/07/17 219 lb 3.2 oz (99.4 kg)  05/05/17 220 lb 6.4 oz (100 kg)      Health Maintenance  Due  Topic Date Due  . Hepatitis C Screening  1946-05-27   Depression screen [facility] 2/9 05/05/2017 12/15/2016  Decreased Interest 0 0  Down, Depressed, Hopeless 0 0  PHQ - 2 Score 0 0   PMHx, SurgHx, SocialHx, FamHx, Medications, and Allergies were reviewed in the Visit Navigator and updated as appropriate.   Patient Active Problem List   Diagnosis Date Noted  . Morbid obesity ([place]) 01/04/2017  . Pure hypercholesterolemia 01/04/2017  . Vitamin D deficiency 12/15/2016  . Glaucoma   . Hypertension 04/19/2014  . Hypothyroidism 04/19/2014  . History of endometrial cancer 03/30/2014   Social History   Tobacco Use  . Smoking status: Never Smoker  . Smokeless tobacco: Never Used  Substance Use Topics  . Alcohol use: No  . Drug use: Not on file   Current Medications and Allergies:   Current Outpatient Medications:  .  amLODipine-valsartan (EXFORGE) 10-320 MG tablet, Take 1 tablet by mouth every morning., Disp: 90 tablet, Rfl: 0 .  atenolol (TENORMIN) 50 MG tablet, Take 1 tablet (50 mg total) by mouth daily., Disp: 90 tablet, Rfl: 0 .  atorvastatin (LIPITOR) 10 MG tablet,  Take 1 tablet (10 mg total) by mouth daily., Disp: 90 tablet, Rfl: 0 .  cholecalciferol (VITAMIN D) 1000 UNITS tablet, Take 5,000 Units by mouth every other day. , Disp: , Rfl:  .  dorzolamide-timolol (COSOPT) 22.3-6.8 MG/ML ophthalmic solution, , Disp: , Rfl:  .  furosemide (LASIX) 40 MG tablet, Take 1 tablet (40 mg total) by mouth daily., Disp: 90 tablet, Rfl: 0 .  levothyroxine (SYNTHROID, LEVOTHROID) 75 MCG tablet, Take 1 tablet (75 mcg total) by mouth daily., Disp: 90 tablet, Rfl: 3 .  metFORMIN (GLUCOPHAGE) 1000 MG tablet, Take 1 tablet (1,000 mg total) by mouth 2 (two) times daily with a meal., Disp: 180 tablet, Rfl: 3 .  timolol (TIMOPTIC) 0.5 % ophthalmic solution, Place 1 drop into both eyes 2 (two) times daily. , Disp: , Rfl:  .  travoprost, benzalkonium, (TRAVATAN) 0.004 % ophthalmic solution, 1 drop at  bedtime., Disp: , Rfl:   No Known Allergies   Review of Systems   Pertinent items are noted in the HPI. Otherwise, ROS is negative.  Vitals:   Vitals:   11/09/17 1536  BP: 128/84  Pulse: 75  Temp: 98 F (36.7 C)  TempSrc: Oral  SpO2: 99%  Weight: 218 lb (98.9 kg)  Height: 5' (1.524 m)     Body mass index is 42.58 kg/m. Physical Exam:   Physical Exam  Constitutional: She is oriented to person, place, and time. She appears well-developed and well-nourished. No distress.  HENT:  Head: Normocephalic and atraumatic.  Right Ear: External ear normal.  Left Ear: External ear normal.  Nose: Nose normal.  Mouth/Throat: Oropharynx is clear and moist.  Eyes: Pupils are equal, round, and reactive to light. Conjunctivae and EOM are normal.  Neck: Normal range of motion. Neck supple. No thyromegaly present.  Cardiovascular: Normal rate, regular rhythm, normal heart sounds and intact distal pulses.  Pulmonary/Chest: Effort normal and breath sounds normal.  Abdominal: Soft. Bowel sounds are normal.  Musculoskeletal: Normal range of motion.  Lymphadenopathy:    She has no cervical adenopathy.  Neurological: She is alert and oriented to person, place, and time.  Skin: Skin is warm and dry. Capillary refill takes less than 2 seconds.  Psychiatric: She has a normal mood and affect. Her behavior is normal.  Nursing note and vitals reviewed.    Results for orders placed or performed in visit on 11/09/17  POCT glycosylated hemoglobin (Hb A1C)  Result Value Ref Range   Hemoglobin A1C 6.7     Assessment and Plan:   Crystal Ashley was seen today for follow-up.  Diagnoses and all orders for this visit:  Type 2 diabetes mellitus without complication, without long-term current use of insulin ([place]) Comments: Doing well. Compliant with medications. No concerns. Continue current treatment. Orders: -     POCT glycosylated hemoglobin (Hb A1C)  Pure hypercholesterolemia Comments: Controlled.  Continue current treatment.   Morbid obesity ([place]) Comments: Reviewed the importance of weight loss through healthy food choices.     . Reviewed expectations re: course of current medical issues. . Discussed self-management of symptoms. . Outlined signs and symptoms indicating need for more acute intervention. . Patient verbalized understanding and all questions were answered. Marland Kitchen Health Maintenance issues including appropriate healthy diet, exercise, and smoking avoidance were discussed with patient. . See orders for this visit as documented in the electronic medical record. . Patient received an After Visit Summary.  Briscoe Deutscher, DO [place], [facility] [place] [facility] 11/11/2017  No future appointments.

## 2017-12-22 ENCOUNTER — Other Ambulatory Visit: Payer: Self-pay | Admitting: Family Medicine

## 2017-12-22 NOTE — Telephone Encounter (Signed)
Patient states that [place] was sending a prescription for amlodipine. Patient is requesting a 90 day supply. FYI

## 2017-12-22 NOTE — Telephone Encounter (Signed)
See note

## 2018-02-08 DIAGNOSIS — H2511 Age-related nuclear cataract, right eye: Secondary | ICD-10-CM | POA: Diagnosis not present

## 2018-02-08 DIAGNOSIS — H25011 Cortical age-related cataract, right eye: Secondary | ICD-10-CM | POA: Diagnosis not present

## 2018-02-08 DIAGNOSIS — H401134 Primary open-angle glaucoma, bilateral, indeterminate stage: Secondary | ICD-10-CM | POA: Diagnosis not present

## 2018-02-08 DIAGNOSIS — H25041 Posterior subcapsular polar age-related cataract, right eye: Secondary | ICD-10-CM | POA: Diagnosis not present

## 2018-03-10 DIAGNOSIS — H25811 Combined forms of age-related cataract, right eye: Secondary | ICD-10-CM | POA: Diagnosis not present

## 2018-03-10 DIAGNOSIS — H25041 Posterior subcapsular polar age-related cataract, right eye: Secondary | ICD-10-CM | POA: Diagnosis not present

## 2018-03-10 DIAGNOSIS — H2511 Age-related nuclear cataract, right eye: Secondary | ICD-10-CM | POA: Diagnosis not present

## 2018-03-10 DIAGNOSIS — H25011 Cortical age-related cataract, right eye: Secondary | ICD-10-CM | POA: Diagnosis not present

## 2018-03-15 ENCOUNTER — Ambulatory Visit (INDEPENDENT_AMBULATORY_CARE_PROVIDER_SITE_OTHER): Payer: Medicare Other | Admitting: Family Medicine

## 2018-03-15 ENCOUNTER — Encounter: Payer: Self-pay | Admitting: Family Medicine

## 2018-03-15 VITALS — BP 126/84 | HR 110 | Temp 98.9°F | Ht 60.0 in | Wt 213.2 lb

## 2018-03-15 DIAGNOSIS — I4891 Unspecified atrial fibrillation: Secondary | ICD-10-CM | POA: Diagnosis not present

## 2018-03-15 DIAGNOSIS — Z1212 Encounter for screening for malignant neoplasm of rectum: Secondary | ICD-10-CM | POA: Diagnosis not present

## 2018-03-15 DIAGNOSIS — Z1211 Encounter for screening for malignant neoplasm of colon: Secondary | ICD-10-CM

## 2018-03-15 DIAGNOSIS — Z1159 Encounter for screening for other viral diseases: Secondary | ICD-10-CM

## 2018-03-15 DIAGNOSIS — E785 Hyperlipidemia, unspecified: Secondary | ICD-10-CM | POA: Diagnosis not present

## 2018-03-15 DIAGNOSIS — I499 Cardiac arrhythmia, unspecified: Secondary | ICD-10-CM

## 2018-03-15 DIAGNOSIS — E1169 Type 2 diabetes mellitus with other specified complication: Secondary | ICD-10-CM

## 2018-03-15 DIAGNOSIS — E119 Type 2 diabetes mellitus without complications: Secondary | ICD-10-CM

## 2018-03-15 DIAGNOSIS — Z9189 Other specified personal risk factors, not elsewhere classified: Secondary | ICD-10-CM | POA: Diagnosis not present

## 2018-03-15 DIAGNOSIS — I10 Essential (primary) hypertension: Secondary | ICD-10-CM | POA: Insufficient documentation

## 2018-03-15 DIAGNOSIS — E039 Hypothyroidism, unspecified: Secondary | ICD-10-CM

## 2018-03-15 LAB — COMPREHENSIVE METABOLIC PANEL
ALT: 15 U/L (ref 0–35)
AST: 14 U/L (ref 0–37)
Albumin: 4.1 g/dL (ref 3.5–5.2)
Alkaline Phosphatase: 85 U/L (ref 39–117)
BUN: 16 mg/dL (ref 6–23)
CO2: 31 mEq/L (ref 19–32)
Calcium: 9.8 mg/dL (ref 8.4–10.5)
Chloride: 100 mEq/L (ref 96–112)
Creatinine, Ser: 0.92 mg/dL (ref 0.40–1.20)
GFR: 63.75 mL/min (ref >60.00)
Glucose, Bld: 184 mg/dL — ABNORMAL HIGH (ref 70–99)
Potassium: 3.9 mEq/L (ref 3.5–5.1)
Sodium: 139 mEq/L (ref 135–145)
Total Bilirubin: 0.7 mg/dL (ref 0.2–1.2)
Total Protein: 7.5 g/dL (ref 6.0–8.3)

## 2018-03-15 LAB — CBC WITH DIFFERENTIAL/PLATELET
Basophils Absolute: 0 10*3/uL (ref 0.0–0.1)
Basophils Relative: 0.5 % (ref 0.0–3.0)
Eosinophils Absolute: 0.2 10*3/uL (ref 0.0–0.7)
Eosinophils Relative: 2.7 % (ref 0.0–5.0)
HCT: 38.6 % (ref 36.0–46.0)
Hemoglobin: 12.9 g/dL (ref 12.0–15.0)
Lymphocytes Relative: 21.3 % (ref 12.0–46.0)
Lymphs Abs: 1.8 10*3/uL (ref 0.7–4.0)
MCHC: 33.5 g/dL (ref 30.0–36.0)
MCV: 90.2 fl (ref 78.0–100.0)
Monocytes Absolute: 0.4 10*3/uL (ref 0.1–1.0)
Monocytes Relative: 4.6 % (ref 3.0–12.0)
Neutro Abs: 6.1 10*3/uL (ref 1.4–7.7)
Neutrophils Relative %: 70.9 % (ref 43.0–77.0)
Platelets: 254 10*3/uL (ref 150.0–400.0)
RBC: 4.27 Mil/uL (ref 3.87–5.11)
RDW: 15.6 % — ABNORMAL HIGH (ref 11.5–15.5)
WBC: 8.6 10*3/uL (ref 4.0–10.5)

## 2018-03-15 LAB — LIPID PANEL
Cholesterol: 133 mg/dL (ref 0–200)
HDL: 42.2 mg/dL (ref >39.00)
LDL Cholesterol: 54 mg/dL (ref 0–99)
NonHDL: 90.58
Total CHOL/HDL Ratio: 3
Triglycerides: 183 mg/dL — ABNORMAL HIGH (ref 0.0–149.0)
VLDL: 36.6 mg/dL (ref 0.0–40.0)

## 2018-03-15 LAB — TSH: TSH: 1.63 u[IU]/mL (ref 0.35–4.50)

## 2018-03-15 LAB — T4, FREE: Free T4: 1.07 ng/dL (ref 0.60–1.60)

## 2018-03-15 MED ORDER — METOPROLOL SUCCINATE ER 100 MG PO TB24
100.0000 mg | ORAL_TABLET | Freq: Every day | ORAL | 3 refills | Status: DC
Start: 2018-03-15 — End: 2018-12-05

## 2018-03-15 MED ORDER — RIVAROXABAN 20 MG PO TABS: 20.0000 mg | ORAL_TABLET | Freq: Every day | ORAL | 6 refills | Status: DC

## 2018-03-15 NOTE — Addendum Note (Signed)
Addended by: Briscoe Deutscher R on: 03/15/2018 01:08 PM   Modules accepted: Orders

## 2018-03-15 NOTE — Progress Notes (Signed)
Crystal Ashley is a 72 y.o. female is here for follow up.  History of Present Illness:   HPI:   DIABETES Taking medication compliantly without noted sided effects [x]   YES  []   NO  Episodes of hypoglycemia? []   YES  [x]   NO Maintaining a diabetic diet? []   YES  []   NO Trying to exercise on a regular basis? []   YES  [x]   NO   Lab Results  Component Value Date   HGBA1C 6.7 11/09/2017    No results found for: Derl Barrow   Lab Results  Component Value Date   CHOL 110 05/05/2017   HDL 40.00 05/05/2017   LDLCALC 41 05/05/2017   TRIG 147.0 05/05/2017   CHOLHDL 3 05/05/2017     Wt Readings from Last 3 Encounters:  03/15/18 213 lb 3.2 oz (96.7 kg)  11/09/17 218 lb (98.9 kg)  06/07/17 219 lb 3.2 oz (99.4 kg)   BP Readings from Last 3 Encounters:  03/15/18 126/84  11/09/17 128/84  06/07/17 108/84   Lab Results  Component Value Date   CREATININE 0.88 05/05/2017   Had eye surgery last week. They noted irregular HR. Denies CP, SOB, fatigue, edema. No Hx of the same.   Health Maintenance Due  Topic Date Due  . Hepatitis C Screening  25-Nov-1945   Depression screen [facility] 2/9 05/05/2017 12/15/2016  Decreased Interest 0 0  Down, Depressed, Hopeless 0 0  PHQ - 2 Score 0 0   PMHx, SurgHx, SocialHx, FamHx, Medications, and Allergies were reviewed in the Visit Navigator and updated as appropriate.   Patient Active Problem List   Diagnosis Date Noted  . Type 2 diabetes mellitus without complication, without long-term current use of insulin ([place]) 03/15/2018  . Atrial fibrillation ([place]) 03/15/2018  . Morbid obesity ([place]) 01/04/2017  . Hyperlipidemia associated with type 2 diabetes mellitus ([place]) 01/04/2017  . Vitamin D deficiency 12/15/2016  . Glaucoma   . Hypertension associated with diabetes ([place]) 04/19/2014  . Hypothyroidism 04/19/2014  . History of endometrial cancer 03/30/2014   Social History   Tobacco Use  . Smoking status: Never Smoker  . Smokeless  tobacco: Never Used  Substance Use Topics  . Alcohol use: No  . Drug use: Not on file   Current Medications and Allergies:   Current Outpatient Medications:  .  amLODipine-valsartan (EXFORGE) 10-320 MG tablet, TAKE 1 TABLET EVERY MORNING, Disp: 90 tablet, Rfl: 3 .  atorvastatin (LIPITOR) 10 MG tablet, Take 1 tablet (10 mg total) by mouth daily., Disp: 90 tablet, Rfl: 0 .  cholecalciferol (VITAMIN D) 1000 UNITS tablet, Take 5,000 Units by mouth every other day. , Disp: , Rfl:  .  dorzolamide-timolol (COSOPT) 22.3-6.8 MG/ML ophthalmic solution, , Disp: , Rfl:  .  furosemide (LASIX) 40 MG tablet, Take 1 tablet (40 mg total) by mouth daily., Disp: 90 tablet, Rfl: 0 .  levothyroxine (SYNTHROID, LEVOTHROID) 75 MCG tablet, Take 1 tablet (75 mcg total) by mouth daily., Disp: 90 tablet, Rfl: 3 .  metFORMIN (GLUCOPHAGE) 1000 MG tablet, Take 1 tablet (1,000 mg total) by mouth 2 (two) times daily with a meal., Disp: 180 tablet, Rfl: 3 .  timolol (TIMOPTIC) 0.5 % ophthalmic solution, Place 1 drop into both eyes 2 (two) times daily. , Disp: , Rfl:  .  travoprost, benzalkonium, (TRAVATAN) 0.004 % ophthalmic solution, 1 drop at bedtime., Disp: , Rfl:  .  metoprolol succinate (TOPROL-XL) 100 MG 24 hr tablet, Take 1 tablet (100 mg total)  by mouth daily. Take with or immediately following a meal., Disp: 90 tablet, Rfl: 3 .  rivaroxaban (XARELTO) 20 MG TABS tablet, Take 1 tablet (20 mg total) by mouth daily with supper., Disp: 30 tablet, Rfl: 6  No Known Allergies Review of Systems   Pertinent items are noted in the HPI. Otherwise, ROS is negative.  Vitals:   Vitals:   03/15/18 1304  BP: 126/84  Pulse: (!) 110  Temp: 98.9 F (37.2 C)  TempSrc: Oral  SpO2: 97%  Weight: 213 lb 3.2 oz (96.7 kg)  Height: 5' (1.524 m)     Body mass index is 41.64 kg/m.  Physical Exam:   Physical Exam  Constitutional: She appears well-nourished.  HENT:  Head: Normocephalic and atraumatic.  Eyes: Pupils are  equal, round, and reactive to light. EOM are normal.  Neck: Normal range of motion. Neck supple.  Cardiovascular: Normal rate, normal heart sounds and intact distal pulses. An irregularly irregular rhythm present.  Pulmonary/Chest: Effort normal.  Abdominal: Soft.  Skin: Skin is warm.  Psychiatric: She has a normal mood and affect. Her behavior is normal.  Nursing note and vitals reviewed.  Results for orders placed or performed in visit on 11/09/17  POCT glycosylated hemoglobin (Hb A1C)  Result Value Ref Range   Hemoglobin A1C 6.7    EKG: atrial fibrillation, rate 90s.   Assessment and Plan:   Crystal Ashley was seen today for follow-up.  Diagnoses and all orders for this visit:  Type 2 diabetes mellitus without complication, without long-term current use of insulin ([place]) Comments: Doing well. Compliant with medications. Labs today. Orders: -     CBC with Differential/Platelet  Encounter for hepatitis C virus screening test for high risk patient -     Hepatitis C antibody  Hyperlipidemia associated with type 2 diabetes mellitus ([place]) Comments: Compliant. Labs today. Orders: -     Comprehensive metabolic panel -     Lipid panel  Acquired hypothyroidism -     TSH -     T4, free  Screening for colorectal cancer -     Cologuard  Irregular heart rate -     EKG 12-Lead  Atrial fibrillation, new onset Comments: Will change Atenolol to Metoprolol and start Xarelto. Quick f/u with Cardiology - Dr. Einar Gip. Orders: -     metoprolol succinate (TOPROL-XL) 100 MG 24 hr tablet; Take 1 tablet (100 mg total) by mouth daily. Take with or immediately following a meal. -     rivaroxaban (XARELTO) 20 MG TABS tablet; Take 1 tablet (20 mg total) by mouth daily with supper.    . Reviewed expectations re: course of current medical issues. . Discussed self-management of symptoms. . Outlined signs and symptoms indicating need for more acute intervention. . Patient verbalized understanding and  all questions were answered. Marland Kitchen Health Maintenance issues including appropriate healthy diet, exercise, and smoking avoidance were discussed with patient. . See orders for this visit as documented in the electronic medical record. . Patient received an After Visit Summary.  Briscoe Deutscher, DO [place], [facility] 03/15/2018  Future Appointments  Date Time Provider [place]  06/15/2018  2:00 PM Briscoe Deutscher, DO [facility]    CMA served as scribe during this visit. History, Physical, and Plan performed by medical provider. The above documentation has been reviewed and is accurate and complete. Briscoe Deutscher, D.O.

## 2018-03-17 LAB — HEPATITIS C ANTIBODY
Hepatitis C Ab: NONREACTIVE
SIGNAL TO CUT-OFF: 0.02 (ref <1.00)

## 2018-03-18 DIAGNOSIS — I4891 Unspecified atrial fibrillation: Secondary | ICD-10-CM | POA: Diagnosis not present

## 2018-03-23 ENCOUNTER — Telehealth: Payer: Self-pay | Admitting: Family Medicine

## 2018-03-23 DIAGNOSIS — E782 Mixed hyperlipidemia: Secondary | ICD-10-CM | POA: Diagnosis not present

## 2018-03-23 DIAGNOSIS — I4891 Unspecified atrial fibrillation: Secondary | ICD-10-CM | POA: Diagnosis not present

## 2018-03-23 DIAGNOSIS — E119 Type 2 diabetes mellitus without complications: Secondary | ICD-10-CM | POA: Diagnosis not present

## 2018-03-23 DIAGNOSIS — I10 Essential (primary) hypertension: Secondary | ICD-10-CM | POA: Diagnosis not present

## 2018-03-23 DIAGNOSIS — Z0189 Encounter for other specified special examinations: Secondary | ICD-10-CM | POA: Diagnosis not present

## 2018-03-23 NOTE — Telephone Encounter (Signed)
Copied from [place] [phone]. Topic: Quick Communication - See Telephone Encounter >> Mar 23, 2018  8:45 AM Ahmed Prima L wrote: CRM for notification. See Telephone encounter for: 03/23/18.  Patient's son [name] ) called and stated that she was in the office on 7/30 and had an EKG. He would like the results of that sent to her cardiologist, Dr Einar Gip. Dr Einar Gip would like the compare those with the EKG that he did in his office. Please fax to [phone].

## 2018-03-23 NOTE — Telephone Encounter (Signed)
EKG faxed called family and let know as well.

## 2018-03-23 NOTE — Telephone Encounter (Signed)
See note

## 2018-03-29 ENCOUNTER — Other Ambulatory Visit: Payer: Self-pay | Admitting: Family Medicine

## 2018-03-29 DIAGNOSIS — E7800 Pure hypercholesterolemia, unspecified: Secondary | ICD-10-CM

## 2018-03-30 DIAGNOSIS — I4891 Unspecified atrial fibrillation: Secondary | ICD-10-CM | POA: Diagnosis not present

## 2018-03-30 DIAGNOSIS — I10 Essential (primary) hypertension: Secondary | ICD-10-CM | POA: Diagnosis not present

## 2018-04-01 DIAGNOSIS — I4891 Unspecified atrial fibrillation: Secondary | ICD-10-CM | POA: Diagnosis not present

## 2018-04-01 DIAGNOSIS — E782 Mixed hyperlipidemia: Secondary | ICD-10-CM | POA: Diagnosis not present

## 2018-04-01 DIAGNOSIS — I10 Essential (primary) hypertension: Secondary | ICD-10-CM | POA: Diagnosis not present

## 2018-04-01 DIAGNOSIS — E119 Type 2 diabetes mellitus without complications: Secondary | ICD-10-CM | POA: Diagnosis not present

## 2018-04-07 NOTE — H&P (Signed)
OFFICE VISIT NOTES COPIED TO EPIC FOR DOCUMENTATION  History of Present Illness Laverda Page MD; 2018/04/01 6:41 PM) Patient words: NP EVAL for afib.  The patient is a 72 year old female who presents with atrial fibrillation. Patient was type 2 diabetes, hyperlipidemia, hypertension, hypothyroidism, history of endometrial cancer in 2015, referred to [place] for evaluation and management of new-onset atrial fibrillation noted during cataract surgery in July 2019. She was previously on atenolol that was recently changed to metoprolol. Started on Xarelto. She is presently tolerating the medications well. She is markedly sedentary and denies any symptoms of chest pain, palpitations, fatigue, shortness of breath. She has chronic leg edema. She takes furosemide. Otherwise no other specific complaints today. Accompanied by her son at the bedside. Her husband is a retired Film/video editor.   Problem List/Past Medical Frances Furbish Johnson; April 01, 2018 8:34 AM) Benign essential hypertension (I10)  Controlled type 2 diabetes mellitus without complication, without long-term current use of insulin (E11.9)  Hyperlipidemia, group A (E78.00)  Hypothyroid (E03.9)   Allergies Frances Furbish Johnson; 04-01-2018 8:34 AM) No Known Drug Allergies [04-01-2018]:  Family History Cheri Kearns; 2018-04-01 8:36 AM) Mother  Deceased. at age 89; MI(third one) Father  Deceased. at age 58; Old age; Unsure of heart issues Sister 5  In good health. All living; No heart issues Brother 2  In good health. living; No heat issues  Social History Cheri Kearns; 04/01/18 8:37 AM) Current tobacco use  Never smoker. Non Drinker/No Alcohol Use  Marital status  Married. Living Situation  Lives with spouse. Number of Children  1.  Past Surgical History Frances Furbish Wynetta Emery; 04/01/2018 8:38 AM) Hysterectomy (not due to cancer) - Complete [2014]: Cataract [02/2018]: Right.  Medication History Laverda Page, MD; Apr 01, 2018 6:46  PM) amLODIPine Besylate-Valsartan (10-320MG Tablet, 1 Oral daily) Active. Atorvastatin Calcium (10MG Tablet, 1 Oral daily) Active. Dorzolamide HCl-Timolol Mal (22.3-6.8MG/ML Solution, Ophthalmic as directed) Active. Furosemide (40MG Tablet, 1 Oral daily) Active. Levothyroxine Sodium (75MCG Tablet, 1 Oral daily) Active. metFORMIN HCl (1000MG Tablet, 1 Oral two times daily) Active. Metoprolol Succinate ER (100MG Tablet ER 24HR, 1 Oral daily) Active. Travatan Z (0.004% Solution, Ophthalmic daily) Active. Timolol Hemihydrate (0.5% Solution, Ophthalmic daily) Active. Medications Reconciled (med list present)  Diagnostic Studies History Frances Furbish Wynetta Emery; 04-01-18 8:38 AM) Echocardiogram  03/18/2018: Left ventricle cavity is normal in size. Moderate concentric hypertrophy of the left ventricle. Visual EF is 50-55%. Left ventricle regional wall motion findings: No wall motion abnormalities. Unable to evaluate diastolic function due to A. Fibrillation. Left atrial cavity is mildly dilated at 4.2 cm and moderately dilated with volume. Structurally normal mitral valve with mild (Grade I) regurgitation. Mild to moderate tricuspid regurgitation. No evidence of pulmonary hypertension.    Review of Systems Laverda Page MD; April 01, 2018 6:42 PM) General Not Present- Appetite Loss and Weight Gain. Respiratory Not Present- Chronic Cough and Wakes up from Sleep Wheezing or Short of Breath. Cardiovascular Present- Edema. Gastrointestinal Not Present- Black, Tarry Stool and Difficulty Swallowing. Musculoskeletal Not Present- Decreased Range of Motion and Muscle Atrophy. Neurological Not Present- Attention Deficit. Psychiatric Not Present- Personality Changes and Suicidal Ideation. Endocrine Not Present- Cold Intolerance and Heat Intolerance. Hematology Not Present- Abnormal Bleeding. All other systems negative  Vitals Frances Furbish Johnson; April 01, 2018 8:47 AM) 01-Apr-2018 8:30 AM Weight: 212.06 lb  Height: 60in Body Surface Area: 1.91 m Body Mass Index: 41.42 kg/m  Pulse: 104 (Regular)  P.OX: 98% (Room air) BP: 136/85 (Sitting, Left Arm, Standard)  Physical Exam Laverda Page MD; 2018/04/01 9:37  AM) General Mental Status-Alert. General Appearance-Cooperative and Appears stated age. Build & Nutrition-Moderately built and Morbidly obese.  Head and Neck Thyroid Gland Characteristics - normal size and consistency and no palpable nodules.  Chest and Lung Exam Chest and lung exam reveals -quiet, even and easy respiratory effort with no use of accessory muscles, non-tender and on auscultation, normal breath sounds, no adventitious sounds.  Cardiovascular Auscultation Rhythm - Irregular. Heart Sounds - S1 is variable, S2 WNL and No gallop present. Murmurs & Other Heart Sounds - Murmur - No murmur.  Abdomen Palpation/Percussion Palpation and Percussion of the abdomen reveal - Non Tender and No hepatosplenomegaly.  Peripheral Vascular Lower Extremity Inspection - Bilateral - Thick rigid nails(onychomycosis). Palpation - Temperature - Bilateral - Normal. Edema - Bilateral - 2+ Pitting edema. Femoral pulse - Bilateral - Feeble(Pulse difficult to feel due to patient's bodily habitus.). Popliteal pulse - Bilateral - 0+(Pulse difficult to feel due to patient's bodily habitus.). Dorsalis pedis pulse - Bilateral - Absent. Posterior tibial pulse - Bilateral - Absent. Carotid arteries - Bilateral-No Carotid bruit.  Neurologic Neurologic evaluation reveals -alert and oriented x 3 with no impairment of recent or remote memory. Motor-Grossly intact without any focal deficits.  Musculoskeletal Global Assessment Left Lower Extremity - no deformities, masses or tenderness, no known fractures. Right Lower Extremity - no deformities, masses or tenderness, no known fractures.  Assessment & Plan Laverda Page MD; 03/23/2018 6:45 PM) Atrial fibrillation with  controlled ventricular rate (I48.91) Story: CHA2DS2-VASc Score is 4 with yearly risk of stroke of 4%. HAS-Bled score is and estimated major bleeding in one year is %  Echocardiogram 03/18/2018: Left ventricle cavity is normal in size. Moderate concentric hypertrophy of the left ventricle. Visual EF is 50-55%. Left ventricle regional wall motion findings: No wall motion abnormalities. Unable to evaluate diastolic function due to A. Fibrillation. Left atrial cavity is mildly dilated at 4.2 cm and moderately dilated with volume. Structurally normal mitral valve with mild (Grade I) regurgitation. Mild to moderate tricuspid regurgitation. No evidence of pulmonary hypertension. Impression: -  EKG 08/70/2019: Atrial fibrillation with controlled ventricular response at the rate of 84 bpm, normal axis. Low-voltage competent axis. Diffuse nonspecific T abnormality. No significant change from EKG 03/15/2018.   Lexiscan myoview stress test 04/01/2018:  1. Lexiscan stress test was performed. Exercise capacity was not assessed. No stress symptoms reported. Normal blood pressure.  The resting and stress electrocardiogram demonstrated atrial fibrillation with rapid ventricular rate, low voltage, and normal rest repolarization.  Stress EKG is non diagnostic for ischemia as it is a pharmacologic stress.  2. The overall quality of the study is fair.  Review of the raw data in a rotational cine format reveals breast attenuation, with imaging performed in sitting position.  Gated SPECT images reveal normal myocardial thickening and wall motion.  The left ventricular ejection fraction was calculated or visually estimated to be 47%.  REST and STRESS images demonstrate decreased tracer uptake in the mid inferoseptal, mid inferior, apical septal and apical inferior segments of the left ventricle, worse on rest images. While defect likely represents breast attenuation, ischemia in this region cannot be excluded. Clinical  correlation recommended.  3. Low to intermediate risk study.   Type 2 diabetes mellitus without complication, without long-term current use of insulin (E11.9) Benign essential hypertension (I10) Mixed hyperlipidemia (E78.2) Class 3 severe obesity due to excess calories with serious comorbidity and body mass index (BMI) of 40.0 to 44.9 in adult (E66.01) Laboratory examination (Z01.89)  04/01/2018:  TSH 2.68  03/31/2018: Creatinine 1.03, EGFR 54/63, potassium 4.8, BMP otherwise normal.  Labs 03/15/2018: Total cholesterol 133, triglycerides 183, HDL 42, LDL 54. Non-HDL cholesterol 90. HB 12.9/HCT 38.6, platelets 254, normal indicis. Potassium 3.9, serum glucose 184 mg, BUN 16, creatinine 0.9 to her eGFR greater than 60 ML. TSH normal. A1c 6.7%.  Note:-  Recommendations:  Patient is referred to me for new onset of atrial fibrillation, detected during cataract surgery incidentally. Her diabetes is well controlled, blood pressure is also well controlled. Mixed hyperlipidemia is related to diabetes mellitus with hyperglycemia. She has morbid obesity, this is probably contributing to her atrial fibrillation.  Her husband is a cardiologist, I spoke to him on the phone and discussed with him that she does not need antiarrhythmic therapy but he could certainly try cardioversion once to see whether she would maintain sinus rhythm in view of possible development of diastolic heart failure. She does have chronic 2+ leg edema probably related to obesity and lack of physical activity and she has been on chronically furosemide. No clinical evidence of acute decompensated heart failure.  To exclude ischemic etiology for atrial fibrillation, I'll set her up for a Lexiscan Myoview stress test. I'll then set her up for direct current cardioversion. They are aware that she had to be on lifelong anticoagulants due to high risk of cardioembolic phenomena.  Weight loss was discussed at length. She is also  become significantly disabled and deconditioned due to lack of physical activity and will benefit from physical therapy. I'll see her back after cardioversion (performed if the stress test is low risk) and make further recommendations. I have discussed cardioversion in detail. She also has Onychomycosis and will benefit from podiatric services.  This was a greater than 60 minute office visit with greater than 50% of the time spent with face-to-face encounter with patient and evaluation of complex medical issues, review of external records and labs and coordination of care.  CC: Briscoe Deutscher, DO (PCP)    Signed by Laverda Page, MD (03/23/2018 6:47 PM)

## 2018-04-07 NOTE — Anesthesia Preprocedure Evaluation (Addendum)
Anesthesia Evaluation  Patient identified by MRN, date of birth, ID band Patient awake    Reviewed: Allergy & Precautions, NPO status , Patient's Chart, lab work & pertinent test results  Airway Mallampati: III  TM Distance: >3 FB Neck ROM: Full    Dental no notable dental hx. (+) Teeth Intact, Dental Advisory Given   Pulmonary    Pulmonary exam normal breath sounds clear to auscultation       Cardiovascular hypertension, Pt. on medications Normal cardiovascular exam+ dysrhythmias (on xarelto) Atrial Fibrillation  Rhythm:Irregular Rate:Normal     Neuro/Psych negative neurological ROS  negative psych ROS   GI/Hepatic negative GI ROS, Neg liver ROS,   Endo/Other  diabetes, Type 2, Oral Hypoglycemic AgentsHypothyroidism   Renal/GU negative Renal ROS  negative genitourinary   Musculoskeletal negative musculoskeletal ROS (+)   Abdominal   Peds  Hematology negative hematology ROS (+)   Anesthesia Other Findings   Reproductive/Obstetrics                            Anesthesia Physical Anesthesia Plan  ASA: III  Anesthesia Plan: General   Post-op Pain Management:    Induction: Intravenous  PONV Risk Score and Plan: 3 and Treatment may vary due to age or medical condition  Airway Management Planned: Natural Airway and Simple Face Mask  Additional Equipment:   Intra-op Plan:   Post-operative Plan:   Informed Consent: I have reviewed the patients History and Physical, chart, labs and discussed the procedure including the risks, benefits and alternatives for the proposed anesthesia with the patient or authorized representative who has indicated his/her understanding and acceptance.   Dental advisory given  Plan Discussed with: CRNA  Anesthesia Plan Comments:         Anesthesia Quick Evaluation

## 2018-04-08 ENCOUNTER — Encounter (HOSPITAL_COMMUNITY): Payer: Self-pay | Admitting: *Deleted

## 2018-04-08 ENCOUNTER — Ambulatory Visit (HOSPITAL_COMMUNITY): Payer: Medicare Other | Admitting: Certified Registered"

## 2018-04-08 ENCOUNTER — Ambulatory Visit (HOSPITAL_COMMUNITY)
Admission: RE | Admit: 2018-04-08 | Discharge: 2018-04-08 | Disposition: A | Discharge status: Home / Self Care | Payer: Medicare Other | Source: Ambulatory Visit | Attending: Cardiology | Admitting: Cardiology

## 2018-04-08 ENCOUNTER — Encounter (HOSPITAL_COMMUNITY)
Admission: RE | Disposition: A | Discharge status: Home / Self Care | Payer: Self-pay | Source: Ambulatory Visit | Attending: Cardiology

## 2018-04-08 DIAGNOSIS — I4891 Unspecified atrial fibrillation: Secondary | ICD-10-CM | POA: Diagnosis not present

## 2018-04-08 DIAGNOSIS — E039 Hypothyroidism, unspecified: Secondary | ICD-10-CM | POA: Insufficient documentation

## 2018-04-08 DIAGNOSIS — Z79890 Hormone replacement therapy (postmenopausal): Secondary | ICD-10-CM | POA: Diagnosis not present

## 2018-04-08 DIAGNOSIS — Z79899 Other long term (current) drug therapy: Secondary | ICD-10-CM | POA: Insufficient documentation

## 2018-04-08 DIAGNOSIS — Z7984 Long term (current) use of oral hypoglycemic drugs: Secondary | ICD-10-CM | POA: Diagnosis not present

## 2018-04-08 DIAGNOSIS — E7800 Pure hypercholesterolemia, unspecified: Secondary | ICD-10-CM | POA: Diagnosis not present

## 2018-04-08 DIAGNOSIS — E119 Type 2 diabetes mellitus without complications: Secondary | ICD-10-CM | POA: Insufficient documentation

## 2018-04-08 DIAGNOSIS — Z8249 Family history of ischemic heart disease and other diseases of the circulatory system: Secondary | ICD-10-CM | POA: Insufficient documentation

## 2018-04-08 DIAGNOSIS — E785 Hyperlipidemia, unspecified: Secondary | ICD-10-CM | POA: Diagnosis not present

## 2018-04-08 DIAGNOSIS — E1169 Type 2 diabetes mellitus with other specified complication: Secondary | ICD-10-CM | POA: Diagnosis not present

## 2018-04-08 DIAGNOSIS — Z90710 Acquired absence of both cervix and uterus: Secondary | ICD-10-CM | POA: Diagnosis not present

## 2018-04-08 DIAGNOSIS — I10 Essential (primary) hypertension: Secondary | ICD-10-CM | POA: Insufficient documentation

## 2018-04-08 DIAGNOSIS — Z6841 Body Mass Index (BMI) 40.0 and over, adult: Secondary | ICD-10-CM | POA: Insufficient documentation

## 2018-04-08 DIAGNOSIS — H269 Unspecified cataract: Secondary | ICD-10-CM | POA: Insufficient documentation

## 2018-04-08 HISTORY — PX: CARDIOVERSION: SHX1299

## 2018-04-08 LAB — GLUCOSE, CAPILLARY: GLUCOSE-CAPILLARY: 119 mg/dL — AB (ref 70–99)

## 2018-04-08 SURGERY — CARDIOVERSION
Anesthesia: General

## 2018-04-08 MED ORDER — LIDOCAINE 2% (20 MG/ML) 5 ML SYRINGE
INTRAMUSCULAR | Status: DC | PRN
  Administered 2018-04-08: 60 mg via INTRAVENOUS

## 2018-04-08 MED ORDER — SODIUM CHLORIDE 0.9 % IV SOLN
INTRAVENOUS | Status: DC | PRN
  Administered 2018-04-08: 09:00:00 via INTRAVENOUS

## 2018-04-08 MED ORDER — PROPOFOL 10 MG/ML IV BOLUS
INTRAVENOUS | Status: DC | PRN
  Administered 2018-04-08: 50 mg via INTRAVENOUS

## 2018-04-08 NOTE — Transfer of Care (Signed)
Immediate Anesthesia Transfer of Care Note  Patient: Crystal Ashley  Procedure(s) Performed: CARDIOVERSION (N/A )  Patient Location: PACU  Anesthesia Type:General  Level of Consciousness: drowsy  Airway & Oxygen Therapy: Patient Spontanous Breathing and Patient connected to face mask oxygen  Post-op Assessment: Report given to RN and Post -op Vital signs reviewed and stable  Post vital signs: Reviewed and stable  Last Vitals:  Vitals Value Taken Time  BP    Temp    Pulse    Resp    SpO2      Last Pain:  Vitals:   04/08/18 0757  TempSrc: Oral  PainSc: 0-No pain         Complications: No apparent anesthesia complications

## 2018-04-08 NOTE — CV Procedure (Signed)
Direct current cardioversion:  Indication symptomatic A. Fibrillation.  Procedure: Using 50 mg of IV Propofol and 50 IV Lidocaine (for reducing venous pain) for achieving deep sedation, synchronized direct current cardioversion performed. Patient was delivered with 120x1, 150x1, 200 Joules of electricity X 2 with success to NSR. Patient tolerated the procedure well. No immediate complication noted.

## 2018-04-08 NOTE — Anesthesia Postprocedure Evaluation (Signed)
Anesthesia Post Note  Patient: Crystal Ashley  Procedure(s) Performed: CARDIOVERSION (N/A )     Patient location during evaluation: PACU Anesthesia Type: General Level of consciousness: awake and alert Pain management: pain level controlled Vital Signs Assessment: post-procedure vital signs reviewed and stable Respiratory status: spontaneous breathing, nonlabored ventilation, respiratory function stable and patient connected to nasal cannula oxygen Cardiovascular status: stable and blood pressure returned to baseline Postop Assessment: no apparent nausea or vomiting Anesthetic complications: no    Last Vitals:  Vitals:   04/08/18 0924 04/08/18 0934  BP: (!) 107/56 (!) 102/48  Pulse: 63 60  Resp: 18 18  Temp: 36.7 C   SpO2: 100% 97%    Last Pain:  Vitals:   04/08/18 0934  TempSrc:   PainSc: 0-No pain                 Oveta Idris L Zarin Hagmann

## 2018-04-08 NOTE — Interval H&P Note (Signed)
History and Physical Interval Note:  04/08/2018 9:07 AM  Crystal Ashley  has presented today for surgery, with the diagnosis of A-FIB  The various methods of treatment have been discussed with the patient and family. After consideration of risks, benefits and other options for treatment, the patient has consented to  Procedure(s): CARDIOVERSION (N/A) as a surgical intervention .  The patient's history has been reviewed, patient examined, no change in status, stable for surgery.  I have reviewed the patient's chart and labs.  Questions were answered to the patient's satisfaction.     Adrian Prows

## 2018-04-08 NOTE — Discharge Instructions (Signed)
Electrical Cardioversion, Care After °This sheet gives you information about how to care for yourself after your procedure. Your health care provider may also give you more specific instructions. If you have problems or questions, contact your health care provider. °What can I expect after the procedure? °After the procedure, it is common to have: °· Some redness on the skin where the shocks were given. ° °Follow these instructions at home: °· Do not drive for 24 hours if you were given a medicine to help you relax (sedative). °· Take over-the-counter and prescription medicines only as told by your health care provider. °· Ask your health care provider how to check your pulse. Check it often. °· Rest for 48 hours after the procedure or as told by your health care provider. °· Avoid or limit your caffeine use as told by your health care provider. °Contact a health care provider if: °· You feel like your heart is beating too quickly or your pulse is not regular. °· You have a serious muscle cramp that does not go away. °Get help right away if: °· You have discomfort in your chest. °· You are dizzy or you feel faint. °· You have trouble breathing or you are short of breath. °· Your speech is slurred. °· You have trouble moving an arm or leg on one side of your body. °· Your fingers or toes turn cold or blue. °This information is not intended to replace advice given to you by your health care provider. Make sure you discuss any questions you have with your health care provider. °Document Released: 05/24/2013 Document Revised: 03/06/2016 Document Reviewed: 02/07/2016 °Elsevier Interactive Patient Education © 2018 Elsevier Inc. ° °

## 2018-04-12 ENCOUNTER — Encounter (HOSPITAL_COMMUNITY): Payer: Self-pay | Admitting: Cardiology

## 2018-04-20 DIAGNOSIS — Z9889 Other specified postprocedural states: Secondary | ICD-10-CM | POA: Diagnosis not present

## 2018-04-20 DIAGNOSIS — I4891 Unspecified atrial fibrillation: Secondary | ICD-10-CM | POA: Diagnosis not present

## 2018-04-20 DIAGNOSIS — I10 Essential (primary) hypertension: Secondary | ICD-10-CM | POA: Diagnosis not present

## 2018-04-25 ENCOUNTER — Other Ambulatory Visit: Payer: Self-pay | Admitting: Family Medicine

## 2018-04-25 DIAGNOSIS — E039 Hypothyroidism, unspecified: Secondary | ICD-10-CM

## 2018-04-26 DIAGNOSIS — Z9889 Other specified postprocedural states: Secondary | ICD-10-CM | POA: Insufficient documentation

## 2018-05-19 ENCOUNTER — Telehealth: Payer: Self-pay

## 2018-05-19 NOTE — Telephone Encounter (Signed)
Fax received that patient has not turned in her test. Sent letter to scan.

## 2018-06-13 NOTE — Progress Notes (Signed)
Crystal Ashley is a 72 y.o. female is here for follow up.  History of Present Illness:   HPI: Doing well. No concerns today. Cardioversion successful a few weeks ago. Has not noted any changes in energy level. Deconditioned at baseline. Interested in PT and open to Cardiac Rehab in the future if beneficial. Cardiovascular ROS: no chest pain or dyspnea on exertion.  Health Maintenance Due  Topic Date Due  . TETANUS/TDAP  01/22/1965  . COLONOSCOPY  01/23/1996  . OPHTHALMOLOGY EXAM  04/07/2018  . FOOT EXAM  06/07/2018   Depression screen PHQ 2/9 06/14/2018 05/05/2017 12/15/2016  Decreased Interest 0 0 0  Down, Depressed, Hopeless 0 0 0  PHQ - 2 Score 0 0 0   PMHx, SurgHx, SocialHx, FamHx, Medications, and Allergies were reviewed in the Visit Navigator and updated as appropriate.   Patient Active Problem List   Diagnosis Date Noted  . History of cardioversion 04/26/2018  . Type 2 diabetes mellitus without complication, without long-term current use of insulin ([place]) 03/15/2018  . Atrial fibrillation ([place]) 03/15/2018  . Morbid obesity ([place]) 01/04/2017  . Hyperlipidemia associated with type 2 diabetes mellitus ([place]) 01/04/2017  . Vitamin D deficiency 12/15/2016  . Glaucoma   . Hypertension associated with diabetes ([place]) 04/19/2014  . Hypothyroidism 04/19/2014  . History of endometrial cancer 03/30/2014   Social History   Tobacco Use  . Smoking status: Never Smoker  . Smokeless tobacco: Never Used  Substance Use Topics  . Alcohol use: No  . Drug use: Not on file   Current Medications and Allergies:   Current Outpatient Medications:  .  amLODipine-valsartan (EXFORGE) 10-320 MG tablet, TAKE 1 TABLET EVERY MORNING, Disp: 90 tablet, Rfl: 3 .  atorvastatin (LIPITOR) 10 MG tablet, TAKE 1 TABLET EVERY DAY, Disp: 90 tablet, Rfl: 0 .  cholecalciferol (VITAMIN D) 1000 UNITS tablet, Take 5,000 Units by mouth every other day. , Disp: , Rfl:  .  dorzolamide-timolol (COSOPT) 22.3-6.8  MG/ML ophthalmic solution, Place 1 drop into both eyes daily. , Disp: , Rfl:  .  furosemide (LASIX) 40 MG tablet, TAKE 1 TABLET (40 MG TOTAL) BY MOUTH DAILY., Disp: 90 tablet, Rfl: 0 .  levothyroxine (SYNTHROID, LEVOTHROID) 75 MCG tablet, TAKE 1 TABLET (75 MCG TOTAL) BY MOUTH DAILY., Disp: 90 tablet, Rfl: 3 .  metFORMIN (GLUCOPHAGE) 1000 MG tablet, Take 1 tablet (1,000 mg total) by mouth 2 (two) times daily with a meal., Disp: 180 tablet, Rfl: 3 .  metoprolol succinate (TOPROL-XL) 100 MG 24 hr tablet, Take 1 tablet (100 mg total) by mouth daily. Take with or immediately following a meal., Disp: 90 tablet, Rfl: 3 .  rivaroxaban (XARELTO) 20 MG TABS tablet, Take 1 tablet (20 mg total) by mouth daily with supper., Disp: 30 tablet, Rfl: 6 .  travoprost, benzalkonium, (TRAVATAN) 0.004 % ophthalmic solution, Place 1 drop into both eyes at bedtime. , Disp: , Rfl:   No Known Allergies Review of Systems   Pertinent items are noted in the HPI. Otherwise, ROS is negative.  Vitals:   Vitals:   06/14/18 1416  BP: 124/82  Pulse: 67  Temp: 98.4 F (36.9 C)  TempSrc: Oral  SpO2: 98%  Weight: 206 lb 12.8 oz (93.8 kg)  Height: 5' (1.524 m)     Body mass index is 40.39 kg/m.  Physical Exam:   Physical Exam  Constitutional: She appears well-nourished.  HENT:  Head: Normocephalic and atraumatic.  Eyes: Pupils are equal, round, and reactive to light.  EOM are normal.  Neck: Normal range of motion. Neck supple.  Cardiovascular: Normal rate, regular rhythm, normal heart sounds and intact distal pulses.  Extrasystoles are present.  Pulmonary/Chest: Effort normal.  Abdominal: Soft.  Skin: Skin is warm.  Psychiatric: She has a normal mood and affect. Her behavior is normal.  Nursing note and vitals reviewed.  Diabetic Foot Exam - Simple   Simple Foot Form Diabetic Foot exam was performed with the following findings:  Yes 06/15/2018  7:53 AM  Visual Inspection No deformities, no ulcerations, no  other skin breakdown bilaterally:  Yes Sensation Testing Intact to touch and monofilament testing bilaterally:  Yes Pulse Check Posterior Tibialis and Dorsalis pulse intact bilaterally:  Yes Comments    Results for orders placed or performed in visit on 06/14/18  POCT glycosylated hemoglobin (Hb A1C)  Result Value Ref Range   Hemoglobin A1C 6.3 (A) 4.0 - 5.6 %   HbA1c POC (<> result, manual entry)     HbA1c, POC (prediabetic range)     HbA1c, POC (controlled diabetic range)     Assessment and Plan:   Diagnoses and all orders for this visit:  Type 2 diabetes mellitus without complication, without long-term current use of insulin ([place]) Comments: Doing well. Will order PT. Foot exam today. Flu shot today. Orders: -     POCT glycosylated hemoglobin (Hb A1C)  Encounter for immunization -     Flu vaccine HIGH DOSE PF  Declining mobility Comments: Will order home PT. Offered Cardiac Rehab. Orders: -     Ambulatory referral to [place] obesity ([place]) Comments: Down 7 pounds since last visit. She will continue to make healthy food choices.   Hyperlipidemia associated with type 2 diabetes mellitus ([place]) Comments: Doing well on Lipitor. No myalgias. Labs completed at last visit.   Hypertension associated with diabetes ([place]) Comments: Controlled on current medications. No CP, SOB, edema. Regular rhythm today. Followed by Dr. Nadyne Coombes and will see him q 6 months.    . Reviewed expectations re: course of current medical issues. . Discussed self-management of symptoms. . Outlined signs and symptoms indicating need for more acute intervention. . Patient verbalized understanding and all questions were answered. Marland Kitchen Health Maintenance issues including appropriate healthy diet, exercise, and smoking avoidance were discussed with patient. . See orders for this visit as documented in the electronic medical record. . Patient received an After Visit Summary.  Briscoe Deutscher,  DO [place], [place] 06/15/2018

## 2018-06-14 ENCOUNTER — Encounter: Payer: Self-pay | Admitting: Family Medicine

## 2018-06-14 ENCOUNTER — Ambulatory Visit (INDEPENDENT_AMBULATORY_CARE_PROVIDER_SITE_OTHER): Payer: Medicare Other | Admitting: Family Medicine

## 2018-06-14 VITALS — BP 124/82 | HR 67 | Temp 98.4°F | Ht 60.0 in | Wt 206.8 lb

## 2018-06-14 DIAGNOSIS — E119 Type 2 diabetes mellitus without complications: Secondary | ICD-10-CM | POA: Diagnosis not present

## 2018-06-14 DIAGNOSIS — E785 Hyperlipidemia, unspecified: Secondary | ICD-10-CM | POA: Diagnosis not present

## 2018-06-14 DIAGNOSIS — E1159 Type 2 diabetes mellitus with other circulatory complications: Secondary | ICD-10-CM

## 2018-06-14 DIAGNOSIS — Z23 Encounter for immunization: Secondary | ICD-10-CM | POA: Diagnosis not present

## 2018-06-14 DIAGNOSIS — E1169 Type 2 diabetes mellitus with other specified complication: Secondary | ICD-10-CM | POA: Diagnosis not present

## 2018-06-14 DIAGNOSIS — I152 Hypertension secondary to endocrine disorders: Secondary | ICD-10-CM

## 2018-06-14 DIAGNOSIS — I10 Essential (primary) hypertension: Secondary | ICD-10-CM | POA: Diagnosis not present

## 2018-06-14 DIAGNOSIS — Z7409 Other reduced mobility: Secondary | ICD-10-CM | POA: Diagnosis not present

## 2018-06-14 LAB — POCT GLYCOSYLATED HEMOGLOBIN (HGB A1C): Hemoglobin A1C: 6.3 % — AB (ref 4.0–5.6)

## 2018-06-15 ENCOUNTER — Encounter: Payer: Self-pay | Admitting: Family Medicine

## 2018-06-15 ENCOUNTER — Ambulatory Visit: Payer: Medicare Other | Admitting: Family Medicine

## 2018-06-17 ENCOUNTER — Telehealth: Payer: Self-pay | Admitting: Family Medicine

## 2018-06-17 DIAGNOSIS — Z6841 Body Mass Index (BMI) 40.0 and over, adult: Secondary | ICD-10-CM | POA: Diagnosis not present

## 2018-06-17 DIAGNOSIS — E1159 Type 2 diabetes mellitus with other circulatory complications: Secondary | ICD-10-CM | POA: Diagnosis not present

## 2018-06-17 DIAGNOSIS — E785 Hyperlipidemia, unspecified: Secondary | ICD-10-CM | POA: Diagnosis not present

## 2018-06-17 DIAGNOSIS — E1169 Type 2 diabetes mellitus with other specified complication: Secondary | ICD-10-CM | POA: Diagnosis not present

## 2018-06-17 DIAGNOSIS — Z7901 Long term (current) use of anticoagulants: Secondary | ICD-10-CM | POA: Diagnosis not present

## 2018-06-17 DIAGNOSIS — I4891 Unspecified atrial fibrillation: Secondary | ICD-10-CM | POA: Diagnosis not present

## 2018-06-17 DIAGNOSIS — I152 Hypertension secondary to endocrine disorders: Secondary | ICD-10-CM | POA: Diagnosis not present

## 2018-06-17 DIAGNOSIS — H409 Unspecified glaucoma: Secondary | ICD-10-CM | POA: Diagnosis not present

## 2018-06-17 DIAGNOSIS — R2689 Other abnormalities of gait and mobility: Secondary | ICD-10-CM | POA: Diagnosis not present

## 2018-06-17 DIAGNOSIS — Z7984 Long term (current) use of oral hypoglycemic drugs: Secondary | ICD-10-CM | POA: Diagnosis not present

## 2018-06-17 DIAGNOSIS — Z8542 Personal history of malignant neoplasm of other parts of uterus: Secondary | ICD-10-CM | POA: Diagnosis not present

## 2018-06-17 NOTE — Telephone Encounter (Signed)
L/m message given verbal ok

## 2018-06-17 NOTE — Telephone Encounter (Signed)
Copied from [place] [id number]. Topic: Quick Communication - Home Health Verbal Orders >> Jun 17, 2018  1:34 PM Scherrie Gerlach wrote: Caller/Agency: [facility] Callback Number: [phone] Requesting OT/PT/Skilled Nursing/Social Work:  PT Frequency: 2 wk 3 1 wk 2   for functional mobility and fall reduction

## 2018-06-20 ENCOUNTER — Telehealth: Payer: Self-pay | Admitting: Family Medicine

## 2018-06-20 DIAGNOSIS — H409 Unspecified glaucoma: Secondary | ICD-10-CM | POA: Diagnosis not present

## 2018-06-20 DIAGNOSIS — E1169 Type 2 diabetes mellitus with other specified complication: Secondary | ICD-10-CM | POA: Diagnosis not present

## 2018-06-20 DIAGNOSIS — I152 Hypertension secondary to endocrine disorders: Secondary | ICD-10-CM | POA: Diagnosis not present

## 2018-06-20 DIAGNOSIS — R2689 Other abnormalities of gait and mobility: Secondary | ICD-10-CM | POA: Diagnosis not present

## 2018-06-20 DIAGNOSIS — I4891 Unspecified atrial fibrillation: Secondary | ICD-10-CM | POA: Diagnosis not present

## 2018-06-20 DIAGNOSIS — E1159 Type 2 diabetes mellitus with other circulatory complications: Secondary | ICD-10-CM | POA: Diagnosis not present

## 2018-06-20 NOTE — Telephone Encounter (Signed)
See note

## 2018-06-20 NOTE — Telephone Encounter (Signed)
Copied from [id number]. Topic: Quick Communication - See Telephone Encounter >> Jun 20, 2018  3:30 PM Bea Graff, NT wrote: CRM for notification. See Telephone encounter for: 06/20/18. Clair Gulling, PT with [facility] states pt is taking metFORMIN (GLUCOPHAGE) 1000 MG tablet only 1 time a day and states she will wait to talk to the doctor before taking twice a day as stated on the rx bottle. Please advise. CB#: [phone]

## 2018-06-21 NOTE — Telephone Encounter (Signed)
Should she be on bid?

## 2018-06-22 DIAGNOSIS — I152 Hypertension secondary to endocrine disorders: Secondary | ICD-10-CM | POA: Diagnosis not present

## 2018-06-22 DIAGNOSIS — E1159 Type 2 diabetes mellitus with other circulatory complications: Secondary | ICD-10-CM | POA: Diagnosis not present

## 2018-06-22 DIAGNOSIS — H409 Unspecified glaucoma: Secondary | ICD-10-CM | POA: Diagnosis not present

## 2018-06-22 DIAGNOSIS — E1169 Type 2 diabetes mellitus with other specified complication: Secondary | ICD-10-CM | POA: Diagnosis not present

## 2018-06-22 DIAGNOSIS — I4891 Unspecified atrial fibrillation: Secondary | ICD-10-CM | POA: Diagnosis not present

## 2018-06-22 DIAGNOSIS — R2689 Other abnormalities of gait and mobility: Secondary | ICD-10-CM | POA: Diagnosis not present

## 2018-06-22 NOTE — Telephone Encounter (Signed)
BID is correct. If titrating up, go slowly to avoid abdominal cramping and diarrhea.

## 2018-06-22 NOTE — Telephone Encounter (Signed)
Left message to return call to our office.  

## 2018-06-23 NOTE — Telephone Encounter (Signed)
Called patient l/m as well as left detailed message on Jim cell for him to inform the patient and let him know I did not talk to patient. He is to call if any questions.

## 2018-06-27 ENCOUNTER — Other Ambulatory Visit: Payer: Self-pay | Admitting: Family Medicine

## 2018-06-27 DIAGNOSIS — E1169 Type 2 diabetes mellitus with other specified complication: Secondary | ICD-10-CM | POA: Diagnosis not present

## 2018-06-27 DIAGNOSIS — I4891 Unspecified atrial fibrillation: Secondary | ICD-10-CM | POA: Diagnosis not present

## 2018-06-27 DIAGNOSIS — E1159 Type 2 diabetes mellitus with other circulatory complications: Secondary | ICD-10-CM | POA: Diagnosis not present

## 2018-06-27 DIAGNOSIS — E7800 Pure hypercholesterolemia, unspecified: Secondary | ICD-10-CM

## 2018-06-27 DIAGNOSIS — I152 Hypertension secondary to endocrine disorders: Secondary | ICD-10-CM | POA: Diagnosis not present

## 2018-06-27 DIAGNOSIS — R2689 Other abnormalities of gait and mobility: Secondary | ICD-10-CM | POA: Diagnosis not present

## 2018-06-27 DIAGNOSIS — H409 Unspecified glaucoma: Secondary | ICD-10-CM | POA: Diagnosis not present

## 2018-06-29 DIAGNOSIS — E1159 Type 2 diabetes mellitus with other circulatory complications: Secondary | ICD-10-CM | POA: Diagnosis not present

## 2018-06-29 DIAGNOSIS — H409 Unspecified glaucoma: Secondary | ICD-10-CM | POA: Diagnosis not present

## 2018-06-29 DIAGNOSIS — E1169 Type 2 diabetes mellitus with other specified complication: Secondary | ICD-10-CM | POA: Diagnosis not present

## 2018-06-29 DIAGNOSIS — I4891 Unspecified atrial fibrillation: Secondary | ICD-10-CM | POA: Diagnosis not present

## 2018-06-29 DIAGNOSIS — I152 Hypertension secondary to endocrine disorders: Secondary | ICD-10-CM | POA: Diagnosis not present

## 2018-06-29 DIAGNOSIS — R2689 Other abnormalities of gait and mobility: Secondary | ICD-10-CM | POA: Diagnosis not present

## 2018-07-04 DIAGNOSIS — I4891 Unspecified atrial fibrillation: Secondary | ICD-10-CM | POA: Diagnosis not present

## 2018-07-04 DIAGNOSIS — H409 Unspecified glaucoma: Secondary | ICD-10-CM | POA: Diagnosis not present

## 2018-07-04 DIAGNOSIS — E1159 Type 2 diabetes mellitus with other circulatory complications: Secondary | ICD-10-CM | POA: Diagnosis not present

## 2018-07-04 DIAGNOSIS — E1169 Type 2 diabetes mellitus with other specified complication: Secondary | ICD-10-CM | POA: Diagnosis not present

## 2018-07-04 DIAGNOSIS — I152 Hypertension secondary to endocrine disorders: Secondary | ICD-10-CM | POA: Diagnosis not present

## 2018-07-04 DIAGNOSIS — R2689 Other abnormalities of gait and mobility: Secondary | ICD-10-CM | POA: Diagnosis not present

## 2018-07-06 DIAGNOSIS — R2689 Other abnormalities of gait and mobility: Secondary | ICD-10-CM | POA: Diagnosis not present

## 2018-07-06 DIAGNOSIS — E1169 Type 2 diabetes mellitus with other specified complication: Secondary | ICD-10-CM | POA: Diagnosis not present

## 2018-07-06 DIAGNOSIS — I152 Hypertension secondary to endocrine disorders: Secondary | ICD-10-CM | POA: Diagnosis not present

## 2018-07-06 DIAGNOSIS — H409 Unspecified glaucoma: Secondary | ICD-10-CM | POA: Diagnosis not present

## 2018-07-06 DIAGNOSIS — E1159 Type 2 diabetes mellitus with other circulatory complications: Secondary | ICD-10-CM | POA: Diagnosis not present

## 2018-07-06 DIAGNOSIS — I4891 Unspecified atrial fibrillation: Secondary | ICD-10-CM | POA: Diagnosis not present

## 2018-07-11 ENCOUNTER — Telehealth: Payer: Self-pay | Admitting: Family Medicine

## 2018-07-11 NOTE — Telephone Encounter (Signed)
Copied from [place] [phone]. Topic: Quick Communication - Rx Refill/Question >> Jul 11, 2018 12:52 PM Alfredia Ferguson R wrote: Medication: metFORMIN (GLUCOPHAGE) 1000 MG tablet  Has the patient contacted their pharmacy? Yes  Preferred Pharmacy (with phone number or street name): [place], [place] [phone] (Phone) [phone] (Fax)   Agent: Please be advised that RX refills may take up to 3 business days. We ask that you follow-up with your pharmacy.

## 2018-07-12 ENCOUNTER — Other Ambulatory Visit: Payer: Self-pay

## 2018-07-12 DIAGNOSIS — E119 Type 2 diabetes mellitus without complications: Secondary | ICD-10-CM

## 2018-07-12 MED ORDER — AMLODIPINE BESYLATE-VALSARTAN 10-320 MG PO TABS: 1.0000 | ORAL_TABLET | Freq: Every morning | ORAL | 3 refills | Status: DC

## 2018-07-12 MED ORDER — METFORMIN HCL 1000 MG PO TABS: 1000.0000 mg | ORAL_TABLET | Freq: Two times a day (BID) | ORAL | 3 refills | Status: DC

## 2018-07-12 NOTE — Telephone Encounter (Signed)
Mediation refilled on 11/26 and sent to requested pharmacy.

## 2018-07-13 DIAGNOSIS — H409 Unspecified glaucoma: Secondary | ICD-10-CM | POA: Diagnosis not present

## 2018-07-13 DIAGNOSIS — E1169 Type 2 diabetes mellitus with other specified complication: Secondary | ICD-10-CM | POA: Diagnosis not present

## 2018-07-13 DIAGNOSIS — E1159 Type 2 diabetes mellitus with other circulatory complications: Secondary | ICD-10-CM | POA: Diagnosis not present

## 2018-07-13 DIAGNOSIS — I152 Hypertension secondary to endocrine disorders: Secondary | ICD-10-CM | POA: Diagnosis not present

## 2018-07-13 DIAGNOSIS — R2689 Other abnormalities of gait and mobility: Secondary | ICD-10-CM | POA: Diagnosis not present

## 2018-07-13 DIAGNOSIS — I4891 Unspecified atrial fibrillation: Secondary | ICD-10-CM | POA: Diagnosis not present

## 2018-07-22 DIAGNOSIS — I152 Hypertension secondary to endocrine disorders: Secondary | ICD-10-CM | POA: Diagnosis not present

## 2018-07-22 DIAGNOSIS — E1169 Type 2 diabetes mellitus with other specified complication: Secondary | ICD-10-CM | POA: Diagnosis not present

## 2018-07-22 DIAGNOSIS — R2689 Other abnormalities of gait and mobility: Secondary | ICD-10-CM | POA: Diagnosis not present

## 2018-07-22 DIAGNOSIS — E1159 Type 2 diabetes mellitus with other circulatory complications: Secondary | ICD-10-CM | POA: Diagnosis not present

## 2018-07-22 DIAGNOSIS — I4891 Unspecified atrial fibrillation: Secondary | ICD-10-CM | POA: Diagnosis not present

## 2018-07-22 DIAGNOSIS — H409 Unspecified glaucoma: Secondary | ICD-10-CM | POA: Diagnosis not present

## 2018-07-29 ENCOUNTER — Other Ambulatory Visit: Payer: Self-pay | Admitting: Family Medicine

## 2018-07-29 DIAGNOSIS — I4891 Unspecified atrial fibrillation: Secondary | ICD-10-CM

## 2018-08-01 DIAGNOSIS — E1169 Type 2 diabetes mellitus with other specified complication: Secondary | ICD-10-CM | POA: Diagnosis not present

## 2018-08-01 DIAGNOSIS — I4891 Unspecified atrial fibrillation: Secondary | ICD-10-CM | POA: Diagnosis not present

## 2018-08-01 DIAGNOSIS — E1159 Type 2 diabetes mellitus with other circulatory complications: Secondary | ICD-10-CM | POA: Diagnosis not present

## 2018-08-01 DIAGNOSIS — Z7984 Long term (current) use of oral hypoglycemic drugs: Secondary | ICD-10-CM | POA: Diagnosis not present

## 2018-08-01 DIAGNOSIS — Z7901 Long term (current) use of anticoagulants: Secondary | ICD-10-CM | POA: Diagnosis not present

## 2018-08-01 DIAGNOSIS — Z8542 Personal history of malignant neoplasm of other parts of uterus: Secondary | ICD-10-CM | POA: Diagnosis not present

## 2018-08-01 DIAGNOSIS — Z6841 Body Mass Index (BMI) 40.0 and over, adult: Secondary | ICD-10-CM | POA: Diagnosis not present

## 2018-08-01 DIAGNOSIS — E785 Hyperlipidemia, unspecified: Secondary | ICD-10-CM | POA: Diagnosis not present

## 2018-08-01 DIAGNOSIS — H409 Unspecified glaucoma: Secondary | ICD-10-CM | POA: Diagnosis not present

## 2018-08-01 DIAGNOSIS — R2689 Other abnormalities of gait and mobility: Secondary | ICD-10-CM | POA: Diagnosis not present

## 2018-08-01 DIAGNOSIS — I152 Hypertension secondary to endocrine disorders: Secondary | ICD-10-CM | POA: Diagnosis not present

## 2018-08-08 ENCOUNTER — Telehealth: Payer: Self-pay | Admitting: Family Medicine

## 2018-08-08 ENCOUNTER — Other Ambulatory Visit: Payer: Self-pay

## 2018-08-08 MED ORDER — AMLODIPINE BESYLATE 10 MG PO TABS: 10.0000 mg | ORAL_TABLET | Freq: Every day | ORAL | 2 refills | Status: DC

## 2018-08-08 MED ORDER — VALSARTAN 320 MG PO TABS: 320.0000 mg | ORAL_TABLET | Freq: Every day | ORAL | 3 refills | Status: DC

## 2018-08-08 NOTE — Telephone Encounter (Signed)
See note  Copied from [place] [phone]. Topic: General - Other >> Aug 08, 2018  8:41 AM Virl Axe D wrote: Reason for CRM: [place] called to check on the status of a faxed over refill request for pt's amLODipine-valsartan (EXFORGE) 10-320 MG tablet / Faxed over on 07/29/18 / Please advise

## 2018-08-08 NOTE — Telephone Encounter (Signed)
Called pharmacy they need refill of mediations separate. The Exforge is on back order. I have called in new scripts.

## 2018-08-30 DIAGNOSIS — H401134 Primary open-angle glaucoma, bilateral, indeterminate stage: Secondary | ICD-10-CM | POA: Diagnosis not present

## 2018-09-20 ENCOUNTER — Encounter: Payer: Self-pay | Admitting: Family Medicine

## 2018-09-20 ENCOUNTER — Ambulatory Visit (INDEPENDENT_AMBULATORY_CARE_PROVIDER_SITE_OTHER): Payer: Medicare Other | Admitting: Family Medicine

## 2018-09-20 VITALS — BP 130/80 | HR 69 | Temp 99.1°F | Ht 60.0 in | Wt 210.0 lb

## 2018-09-20 DIAGNOSIS — I10 Essential (primary) hypertension: Secondary | ICD-10-CM | POA: Diagnosis not present

## 2018-09-20 DIAGNOSIS — D649 Anemia, unspecified: Secondary | ICD-10-CM

## 2018-09-20 DIAGNOSIS — E119 Type 2 diabetes mellitus without complications: Secondary | ICD-10-CM | POA: Diagnosis not present

## 2018-09-20 DIAGNOSIS — I480 Paroxysmal atrial fibrillation: Secondary | ICD-10-CM

## 2018-09-20 DIAGNOSIS — E538 Deficiency of other specified B group vitamins: Secondary | ICD-10-CM | POA: Diagnosis not present

## 2018-09-20 DIAGNOSIS — E1159 Type 2 diabetes mellitus with other circulatory complications: Secondary | ICD-10-CM

## 2018-09-20 DIAGNOSIS — I152 Hypertension secondary to endocrine disorders: Secondary | ICD-10-CM

## 2018-09-20 DIAGNOSIS — Z7409 Other reduced mobility: Secondary | ICD-10-CM

## 2018-09-20 DIAGNOSIS — E039 Hypothyroidism, unspecified: Secondary | ICD-10-CM | POA: Diagnosis not present

## 2018-09-20 DIAGNOSIS — E785 Hyperlipidemia, unspecified: Secondary | ICD-10-CM

## 2018-09-20 DIAGNOSIS — E1169 Type 2 diabetes mellitus with other specified complication: Secondary | ICD-10-CM

## 2018-09-20 DIAGNOSIS — Z6841 Body Mass Index (BMI) 40.0 and over, adult: Secondary | ICD-10-CM

## 2018-09-20 NOTE — Progress Notes (Signed)
Crystal Ashley is a 73 y.o. female is here for follow up.  History of Present Illness:   Crystal Ashley, CMA acting as scribe for Dr. Briscoe Deutscher.   HPI:  Stasis Dermatitis: Patient has used compression socks one time and noticed a big improvement but had some constriction with the second time she used. She will work on keeping elevated at night. Soon she they will be able to get pump to use a few times a day.   Diabetes: She has been eating well and following diet. She had eye exam last month. We will get records for our review. Medication compliance: compliant all of the time, diabetic diet compliance: compliant all of the time, compliant most of the time, home glucose monitoring: is not performed, further diabetic ROS: no polyuria or polydipsia, no chest pain, dyspnea or TIA's, no numbness, tingling or pain in extremities.  Patient is having increased trouble getting in and out of the car. Setting on the exam table was uncomfortable for her. She has had trouble going up stairs. They live in a two story home but do not go to second floor. She does have three steps to go from garage to home. She has had PT in the past. If she becomes weaker we will order PT at that time for her.   Health Maintenance Due  Topic Date Due  . TETANUS/TDAP  01/22/1965  . Fecal DNA (Cologuard)  01/23/1996  . OPHTHALMOLOGY EXAM  04/07/2018   Depression screen [facility] 2/9 06/14/2018 05/05/2017 12/15/2016  Decreased Interest 0 0 0  Down, Depressed, Hopeless 0 0 0  PHQ - 2 Score 0 0 0   PMHx, SurgHx, SocialHx, FamHx, Medications, and Allergies were reviewed in the Visit Navigator and updated as appropriate.   Patient Active Problem List   Diagnosis Date Noted  . Declining mobility 09/23/2018  . B12 deficiency 09/23/2018  . Morbid obesity with BMI of 40.0-44.9, adult ([place]) 09/23/2018  . Anemia 09/23/2018  . History of cardioversion 04/26/2018  . Type 2 diabetes mellitus without complication, without  long-term current use of insulin ([place]) 03/15/2018  . Atrial fibrillation ([place]) 03/15/2018  . Morbid obesity ([place]) 01/04/2017  . Hyperlipidemia associated with type 2 diabetes mellitus ([place]) 01/04/2017  . Vitamin D deficiency 12/15/2016  . Glaucoma   . Hypertension associated with diabetes ([place]) 04/19/2014  . Hypothyroidism 04/19/2014  . History of endometrial cancer 03/30/2014   Social History   Tobacco Use  . Smoking status: Never Smoker  . Smokeless tobacco: Never Used  Substance Use Topics  . Alcohol use: No  . Drug use: Not on file   Current Medications and Allergies   Current Outpatient Medications:  .  amLODipine (NORVASC) 10 MG tablet, Take 1 tablet (10 mg total) by mouth daily., Disp: 90 tablet, Rfl: 2 .  amLODipine-valsartan (EXFORGE) 10-320 MG tablet, Take 1 tablet by mouth every morning., Disp: 90 tablet, Rfl: 3 .  atorvastatin (LIPITOR) 10 MG tablet, TAKE 1 TABLET EVERY DAY, Disp: 90 tablet, Rfl: 3 .  cholecalciferol (VITAMIN D) 1000 UNITS tablet, Take 5,000 Units by mouth every other day. , Disp: , Rfl:  .  dorzolamide-timolol (COSOPT) 22.3-6.8 MG/ML ophthalmic solution, Place 1 drop into both eyes daily. , Disp: , Rfl:  .  furosemide (LASIX) 40 MG tablet, TAKE 1 TABLET EVERY DAY, Disp: 90 tablet, Rfl: 1 .  levothyroxine (SYNTHROID, LEVOTHROID) 75 MCG tablet, TAKE 1 TABLET (75 MCG TOTAL) BY MOUTH DAILY., Disp: 90 tablet, Rfl: 3 .  metFORMIN (GLUCOPHAGE) 1000 MG tablet, Take 1 tablet (1,000 mg total) by mouth 2 (two) times daily with a meal., Disp: 180 tablet, Rfl: 3 .  metoprolol succinate (TOPROL-XL) 100 MG 24 hr tablet, Take 1 tablet (100 mg total) by mouth daily. Take with or immediately following a meal., Disp: 90 tablet, Rfl: 3 .  travoprost, benzalkonium, (TRAVATAN) 0.004 % ophthalmic solution, Place 1 drop into both eyes at bedtime. , Disp: , Rfl:  .  valsartan (DIOVAN) 320 MG tablet, Take 1 tablet (320 mg total) by mouth daily., Disp: 90 tablet, Rfl: 3 .  XARELTO  20 MG TABS tablet, TAKE 1 TABLET (20 MG TOTAL) BY MOUTH DAILY WITH SUPPER., Disp: 90 tablet, Rfl: 1  No Known Allergies   Review of Systems   Pertinent items are noted in the HPI. Otherwise, a complete ROS is negative.  Vitals   Vitals:   09/20/18 1412  BP: 130/80  Pulse: 69  Temp: 99.1 F (37.3 C)  TempSrc: Oral  SpO2: 95%  Weight: 210 lb (95.3 kg)  Height: 5' (1.524 m)     Body mass index is 41.01 kg/m.  Physical Exam   Physical Exam Vitals signs and nursing note reviewed.  HENT:     Head: Normocephalic and atraumatic.  Eyes:     Pupils: Pupils are equal, round, and reactive to light.  Neck:     Musculoskeletal: Normal range of motion and neck supple.  Cardiovascular:     Rate and Rhythm: Normal rate and regular rhythm.     Heart sounds: Normal heart sounds.  Pulmonary:     Effort: Pulmonary effort is normal.  Abdominal:     Palpations: Abdomen is soft.  Skin:    General: Skin is warm.     Comments: Mild stasis dermatitis bilaterally.   Psychiatric:        Behavior: Behavior normal.     Assessment and Plan   Crystal Ashley was seen today for follow-up.  Diagnoses and all orders for this visit:  Type 2 diabetes mellitus without complication, without long-term current use of insulin ([place]) Comments: At goal.  Orders: -     Hemoglobin A1c  Hyperlipidemia associated with type 2 diabetes mellitus ([place]) Comments: At goal. On statin. Followed by Dr. Nadyne Coombes.  Orders: -     Comprehensive metabolic panel  Hypertension associated with diabetes ([place]) Comments: At goal. RRR.   B12 deficiency Comments: New. Checked because patient on Metformin. Will offer injections versus oral supplementation.  Orders: -     Vitamin B12  Declining mobility Comments: PT was helpful but she has not been excerising since PT left. Encouraged other exercise and offered new PT referral wen she wants it.   Acquired hypothyroidism Comments: Euthyroid on exam. Will add TSH, free T4  to next lab draw.   Anemia, unspecified type Comments: On Xarelto. Low MCV. B12 def. Will add TIBC to next lab draw. No endorsement of bleeding.  Orders: -     CBC with Differential/Platelet  Morbid obesity with BMI of 40.0-44.9, adult ([place]) Comments: Discussed healthy food choices and exercising as tolerated.     . Orders and follow up as documented in [place], reviewed diet, exercise and weight control, cardiovascular risk and specific lipid/LDL goals reviewed, reviewed medications and side effects in detail.  . Reviewed expectations re: course of current medical issues. . Outlined signs and symptoms indicating need for more acute intervention. . Patient verbalized understanding and all questions were answered. . Patient received an After  Visit Summary.  CMA served as Education administrator during this visit. History, Physical, and Plan performed by medical provider. The above documentation has been reviewed and is accurate and complete. Briscoe Deutscher, D.O.  Briscoe Deutscher, DO [place], [place] 09/23/2018

## 2018-09-21 LAB — COMPREHENSIVE METABOLIC PANEL
ALT: 15 U/L (ref 0–35)
AST: 16 U/L (ref 0–37)
Albumin: 3.8 g/dL (ref 3.5–5.2)
Alkaline Phosphatase: 92 U/L (ref 39–117)
BUN: 19 mg/dL (ref 6–23)
CO2: 28 mEq/L (ref 19–32)
Calcium: 9.3 mg/dL (ref 8.4–10.5)
Chloride: 100 mEq/L (ref 96–112)
Creatinine, Ser: 0.9 mg/dL (ref 0.40–1.20)
GFR: 61.43 mL/min (ref >60.00)
Glucose, Bld: 149 mg/dL — ABNORMAL HIGH (ref 70–99)
Potassium: 4.2 mEq/L (ref 3.5–5.1)
Sodium: 139 mEq/L (ref 135–145)
Total Bilirubin: 0.4 mg/dL (ref 0.2–1.2)
Total Protein: 7 g/dL (ref 6.0–8.3)

## 2018-09-21 LAB — CBC WITH DIFFERENTIAL/PLATELET
Basophils Absolute: 0.1 10*3/uL (ref 0.0–0.1)
Basophils Relative: 1.5 % (ref 0.0–3.0)
Eosinophils Absolute: 0.3 10*3/uL (ref 0.0–0.7)
Eosinophils Relative: 3.1 % (ref 0.0–5.0)
HCT: 35.3 % — ABNORMAL LOW (ref 36.0–46.0)
Hemoglobin: 11.6 g/dL — ABNORMAL LOW (ref 12.0–15.0)
Lymphocytes Relative: 25.5 % (ref 12.0–46.0)
Lymphs Abs: 2.1 10*3/uL (ref 0.7–4.0)
MCHC: 32.8 g/dL (ref 30.0–36.0)
MCV: 89.5 fl (ref 78.0–100.0)
Monocytes Absolute: 0.4 10*3/uL (ref 0.1–1.0)
Monocytes Relative: 5.4 % (ref 3.0–12.0)
Neutro Abs: 5.4 10*3/uL (ref 1.4–7.7)
Neutrophils Relative %: 64.5 % (ref 43.0–77.0)
Platelets: 312 10*3/uL (ref 150.0–400.0)
RBC: 3.94 Mil/uL (ref 3.87–5.11)
RDW: 16 % — ABNORMAL HIGH (ref 11.5–15.5)
WBC: 8.4 10*3/uL (ref 4.0–10.5)

## 2018-09-21 LAB — HEMOGLOBIN A1C: Hgb A1c MFr Bld: 6.6 % — ABNORMAL HIGH (ref 4.6–6.5)

## 2018-09-21 LAB — VITAMIN B12: Vitamin B-12: 219 pg/mL (ref 211–911)

## 2018-09-23 ENCOUNTER — Other Ambulatory Visit: Payer: Self-pay

## 2018-09-23 ENCOUNTER — Telehealth: Payer: Self-pay | Admitting: Family Medicine

## 2018-09-23 DIAGNOSIS — D649 Anemia, unspecified: Secondary | ICD-10-CM | POA: Insufficient documentation

## 2018-09-23 DIAGNOSIS — Z7409 Other reduced mobility: Secondary | ICD-10-CM | POA: Insufficient documentation

## 2018-09-23 DIAGNOSIS — E538 Deficiency of other specified B group vitamins: Secondary | ICD-10-CM | POA: Insufficient documentation

## 2018-09-23 DIAGNOSIS — Z6841 Body Mass Index (BMI) 40.0 and over, adult: Secondary | ICD-10-CM

## 2018-09-23 MED ORDER — CYANOCOBALAMIN 1000 MCG/ML IJ SOLN: INTRAMUSCULAR | 3 refills | Status: DC

## 2018-09-23 MED ORDER — "SYRINGE 25G X 1"" 3 ML MISC": 1 refills | Status: DC

## 2018-09-23 NOTE — Telephone Encounter (Signed)
See note  Copied from [place] [phone]. Topic: Quick Communication - Lab Results (Clinic Use ONLY) >> Sep 23, 2018  1:54 PM Lennox Solders wrote: Pt would like blood work result from 09-20-2018

## 2018-09-23 NOTE — Telephone Encounter (Signed)
Called patient l/m to let know that you are out of office will send message to get results as soon as we can.  

## 2018-09-26 ENCOUNTER — Telehealth: Payer: Self-pay | Admitting: Family Medicine

## 2018-09-26 NOTE — Telephone Encounter (Signed)
Copied from [place] [id number]. Topic: Quick Communication - See Telephone Encounter >> Sep 26, 2018 12:43 PM Blase Mess A wrote: CRM for notification. See Telephone encounter for: 09/26/18.  Patient husband is calling to reschedule AWV visit Please Call [phone]

## 2018-09-26 NOTE — Telephone Encounter (Signed)
Contact and advise on AWV update

## 2018-09-28 ENCOUNTER — Ambulatory Visit: Payer: Medicare Other

## 2018-10-18 ENCOUNTER — Other Ambulatory Visit: Payer: Self-pay

## 2018-10-18 ENCOUNTER — Inpatient Hospital Stay (HOSPITAL_COMMUNITY)
Admission: EM | Admit: 2018-10-18 | Discharge: 2018-10-21 | DRG: 871 | Disposition: A | Discharge status: Home / Self Care | Payer: Medicare Other | Attending: Internal Medicine | Admitting: Internal Medicine

## 2018-10-18 ENCOUNTER — Encounter (HOSPITAL_COMMUNITY): Payer: Self-pay | Admitting: Emergency Medicine

## 2018-10-18 ENCOUNTER — Emergency Department (HOSPITAL_COMMUNITY): Payer: Medicare Other

## 2018-10-18 DIAGNOSIS — I110 Hypertensive heart disease with heart failure: Secondary | ICD-10-CM | POA: Diagnosis present

## 2018-10-18 DIAGNOSIS — J181 Lobar pneumonia, unspecified organism: Secondary | ICD-10-CM | POA: Diagnosis present

## 2018-10-18 DIAGNOSIS — R0902 Hypoxemia: Secondary | ICD-10-CM | POA: Diagnosis not present

## 2018-10-18 DIAGNOSIS — Z79899 Other long term (current) drug therapy: Secondary | ICD-10-CM

## 2018-10-18 DIAGNOSIS — J441 Chronic obstructive pulmonary disease with (acute) exacerbation: Secondary | ICD-10-CM | POA: Diagnosis present

## 2018-10-18 DIAGNOSIS — J440 Chronic obstructive pulmonary disease with acute lower respiratory infection: Secondary | ICD-10-CM | POA: Diagnosis not present

## 2018-10-18 DIAGNOSIS — I5043 Acute on chronic combined systolic (congestive) and diastolic (congestive) heart failure: Secondary | ICD-10-CM | POA: Diagnosis not present

## 2018-10-18 DIAGNOSIS — J9601 Acute respiratory failure with hypoxia: Secondary | ICD-10-CM | POA: Diagnosis not present

## 2018-10-18 DIAGNOSIS — I10 Essential (primary) hypertension: Secondary | ICD-10-CM

## 2018-10-18 DIAGNOSIS — Z7984 Long term (current) use of oral hypoglycemic drugs: Secondary | ICD-10-CM

## 2018-10-18 DIAGNOSIS — J209 Acute bronchitis, unspecified: Secondary | ICD-10-CM | POA: Diagnosis not present

## 2018-10-18 DIAGNOSIS — E785 Hyperlipidemia, unspecified: Secondary | ICD-10-CM | POA: Diagnosis present

## 2018-10-18 DIAGNOSIS — A419 Sepsis, unspecified organism: Principal | ICD-10-CM | POA: Diagnosis present

## 2018-10-18 DIAGNOSIS — D649 Anemia, unspecified: Secondary | ICD-10-CM | POA: Diagnosis present

## 2018-10-18 DIAGNOSIS — Z79890 Hormone replacement therapy (postmenopausal): Secondary | ICD-10-CM

## 2018-10-18 DIAGNOSIS — H409 Unspecified glaucoma: Secondary | ICD-10-CM | POA: Diagnosis present

## 2018-10-18 DIAGNOSIS — R0602 Shortness of breath: Secondary | ICD-10-CM | POA: Diagnosis not present

## 2018-10-18 DIAGNOSIS — E039 Hypothyroidism, unspecified: Secondary | ICD-10-CM | POA: Diagnosis not present

## 2018-10-18 DIAGNOSIS — Z8542 Personal history of malignant neoplasm of other parts of uterus: Secondary | ICD-10-CM

## 2018-10-18 DIAGNOSIS — J189 Pneumonia, unspecified organism: Secondary | ICD-10-CM

## 2018-10-18 DIAGNOSIS — E119 Type 2 diabetes mellitus without complications: Secondary | ICD-10-CM

## 2018-10-18 DIAGNOSIS — Z833 Family history of diabetes mellitus: Secondary | ICD-10-CM

## 2018-10-18 DIAGNOSIS — I5042 Chronic combined systolic (congestive) and diastolic (congestive) heart failure: Secondary | ICD-10-CM

## 2018-10-18 DIAGNOSIS — I480 Paroxysmal atrial fibrillation: Secondary | ICD-10-CM | POA: Diagnosis present

## 2018-10-18 DIAGNOSIS — Z6841 Body Mass Index (BMI) 40.0 and over, adult: Secondary | ICD-10-CM

## 2018-10-18 DIAGNOSIS — Z90710 Acquired absence of both cervix and uterus: Secondary | ICD-10-CM

## 2018-10-18 DIAGNOSIS — D638 Anemia in other chronic diseases classified elsewhere: Secondary | ICD-10-CM | POA: Diagnosis present

## 2018-10-18 DIAGNOSIS — E1169 Type 2 diabetes mellitus with other specified complication: Secondary | ICD-10-CM | POA: Diagnosis present

## 2018-10-18 DIAGNOSIS — Z7901 Long term (current) use of anticoagulants: Secondary | ICD-10-CM

## 2018-10-18 DIAGNOSIS — I4891 Unspecified atrial fibrillation: Secondary | ICD-10-CM | POA: Diagnosis present

## 2018-10-18 DIAGNOSIS — I272 Pulmonary hypertension, unspecified: Secondary | ICD-10-CM | POA: Diagnosis present

## 2018-10-18 LAB — CBC WITH DIFFERENTIAL/PLATELET
Abs Immature Granulocytes: 0 10*3/uL (ref 0.00–0.07)
BAND NEUTROPHILS: 1 %
BASOS ABS: 0.2 10*3/uL — AB (ref 0.0–0.1)
Basophils Relative: 1 %
Eosinophils Absolute: 0 10*3/uL (ref 0.0–0.5)
Eosinophils Relative: 0 %
HCT: 34.8 % — ABNORMAL LOW (ref 36.0–46.0)
Hemoglobin: 10.4 g/dL — ABNORMAL LOW (ref 12.0–15.0)
Lymphocytes Relative: 13 %
Lymphs Abs: 2 10*3/uL (ref 0.7–4.0)
MCH: 28.7 pg (ref 26.0–34.0)
MCHC: 29.9 g/dL — ABNORMAL LOW (ref 30.0–36.0)
MCV: 96.1 fL (ref 80.0–100.0)
Monocytes Absolute: 0.5 10*3/uL (ref 0.1–1.0)
Monocytes Relative: 3 %
Neutro Abs: 12.8 10*3/uL — ABNORMAL HIGH (ref 1.7–7.7)
Neutrophils Relative %: 82 %
Platelets: 319 10*3/uL (ref 150–400)
RBC: 3.62 MIL/uL — ABNORMAL LOW (ref 3.87–5.11)
RDW: 15.3 % (ref 11.5–15.5)
WBC: 15.4 10*3/uL — ABNORMAL HIGH (ref 4.0–10.5)
nRBC: 0 % (ref 0.0–0.2)

## 2018-10-18 LAB — URINALYSIS, ROUTINE W REFLEX MICROSCOPIC
Bacteria, UA: NONE SEEN
Bilirubin Urine: NEGATIVE
Glucose, UA: >=500 mg/dL — AB
Ketones, ur: NEGATIVE mg/dL
Leukocytes,Ua: NEGATIVE
Nitrite: NEGATIVE
Protein, ur: NEGATIVE mg/dL
Specific Gravity, Urine: 1.016 (ref 1.005–1.030)
pH: 5 (ref 5.0–8.0)

## 2018-10-18 LAB — COMPREHENSIVE METABOLIC PANEL
ALBUMIN: 3.6 g/dL (ref 3.5–5.0)
ALT: 20 U/L (ref 0–44)
AST: 25 U/L (ref 15–41)
Alkaline Phosphatase: 95 U/L (ref 38–126)
Anion gap: 8 (ref 5–15)
BUN: 15 mg/dL (ref 8–23)
CHLORIDE: 104 mmol/L (ref 98–111)
CO2: 24 mmol/L (ref 22–32)
Calcium: 8.6 mg/dL — ABNORMAL LOW (ref 8.9–10.3)
Creatinine, Ser: 0.94 mg/dL (ref 0.44–1.00)
GFR calc Af Amer: >60 mL/min (ref >60)
GFR calc non Af Amer: >60 mL/min (ref >60)
Glucose, Bld: 249 mg/dL — ABNORMAL HIGH (ref 70–99)
Potassium: 4.5 mmol/L (ref 3.5–5.1)
Sodium: 136 mmol/L (ref 135–145)
Total Bilirubin: 0.7 mg/dL (ref 0.3–1.2)
Total Protein: 7 g/dL (ref 6.5–8.1)

## 2018-10-18 LAB — BLOOD GAS, VENOUS
Acid-base deficit: 0.8 mmol/L (ref 0.0–2.0)
BICARBONATE: 24.5 mmol/L (ref 20.0–28.0)
O2 Content: 4 L/min
O2 Saturation: 83.1 %
PO2 VEN: 54.6 mmHg — AB (ref 32.0–45.0)
Patient temperature: 98.9
pCO2, Ven: 46.4 mmHg (ref 44.0–60.0)
pH, Ven: 7.344 (ref 7.250–7.430)

## 2018-10-18 LAB — D-DIMER, QUANTITATIVE: D-Dimer, Quant: 0.3 ug/mL-FEU (ref 0.00–0.50)

## 2018-10-18 LAB — TROPONIN I: Troponin I: <0.03 ng/mL (ref <0.03)

## 2018-10-18 LAB — BRAIN NATRIURETIC PEPTIDE: B Natriuretic Peptide: 397.1 pg/mL — ABNORMAL HIGH (ref 0.0–100.0)

## 2018-10-18 IMAGING — CR DG CHEST 2V
2 series · 2 of 2 positions shown · non-contrast
Comparison: None.

CLINICAL DATA: Shortness of breath since this morning. Wheezing.

EXAM:
CHEST - 2 VIEW

[w chest pa]
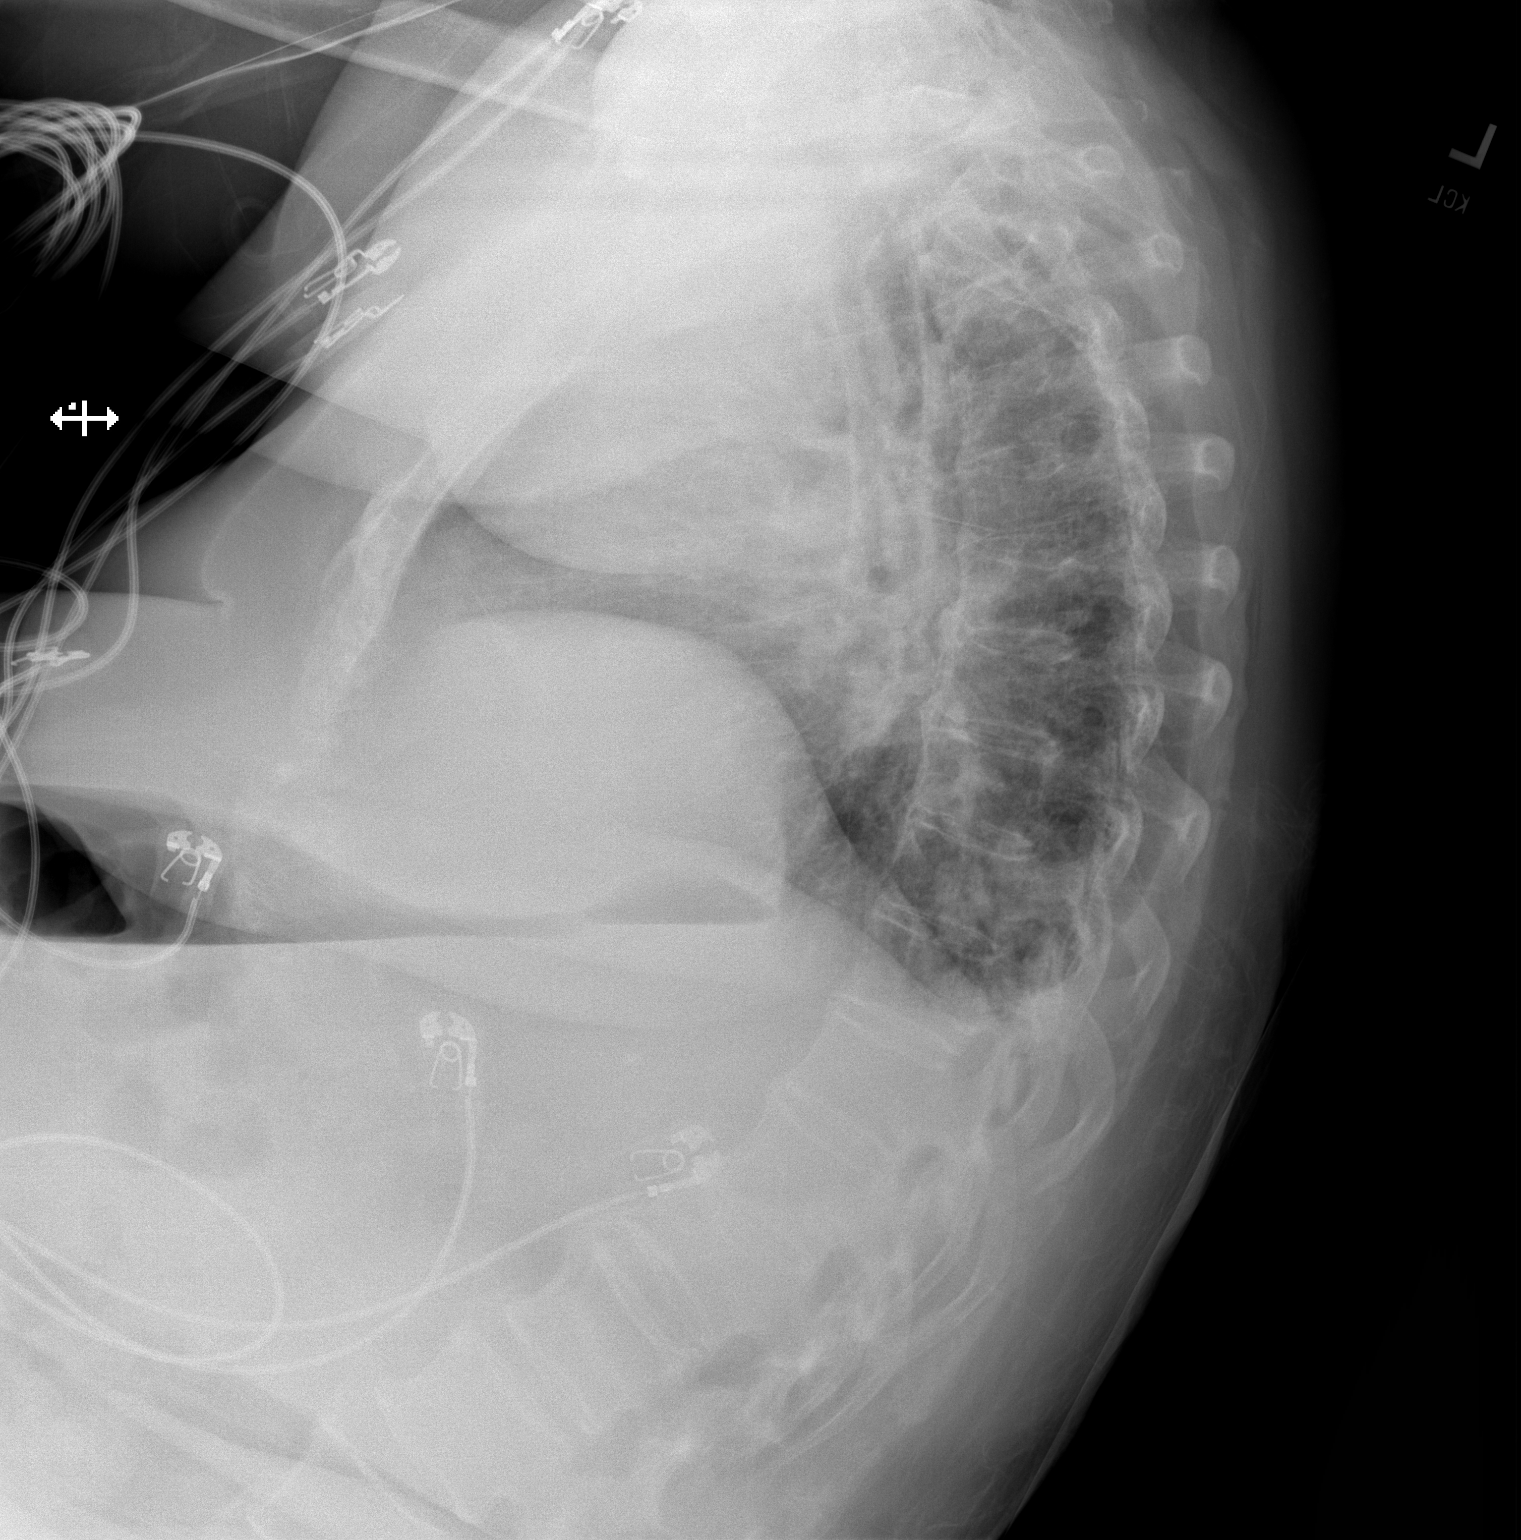

[x chest ap]
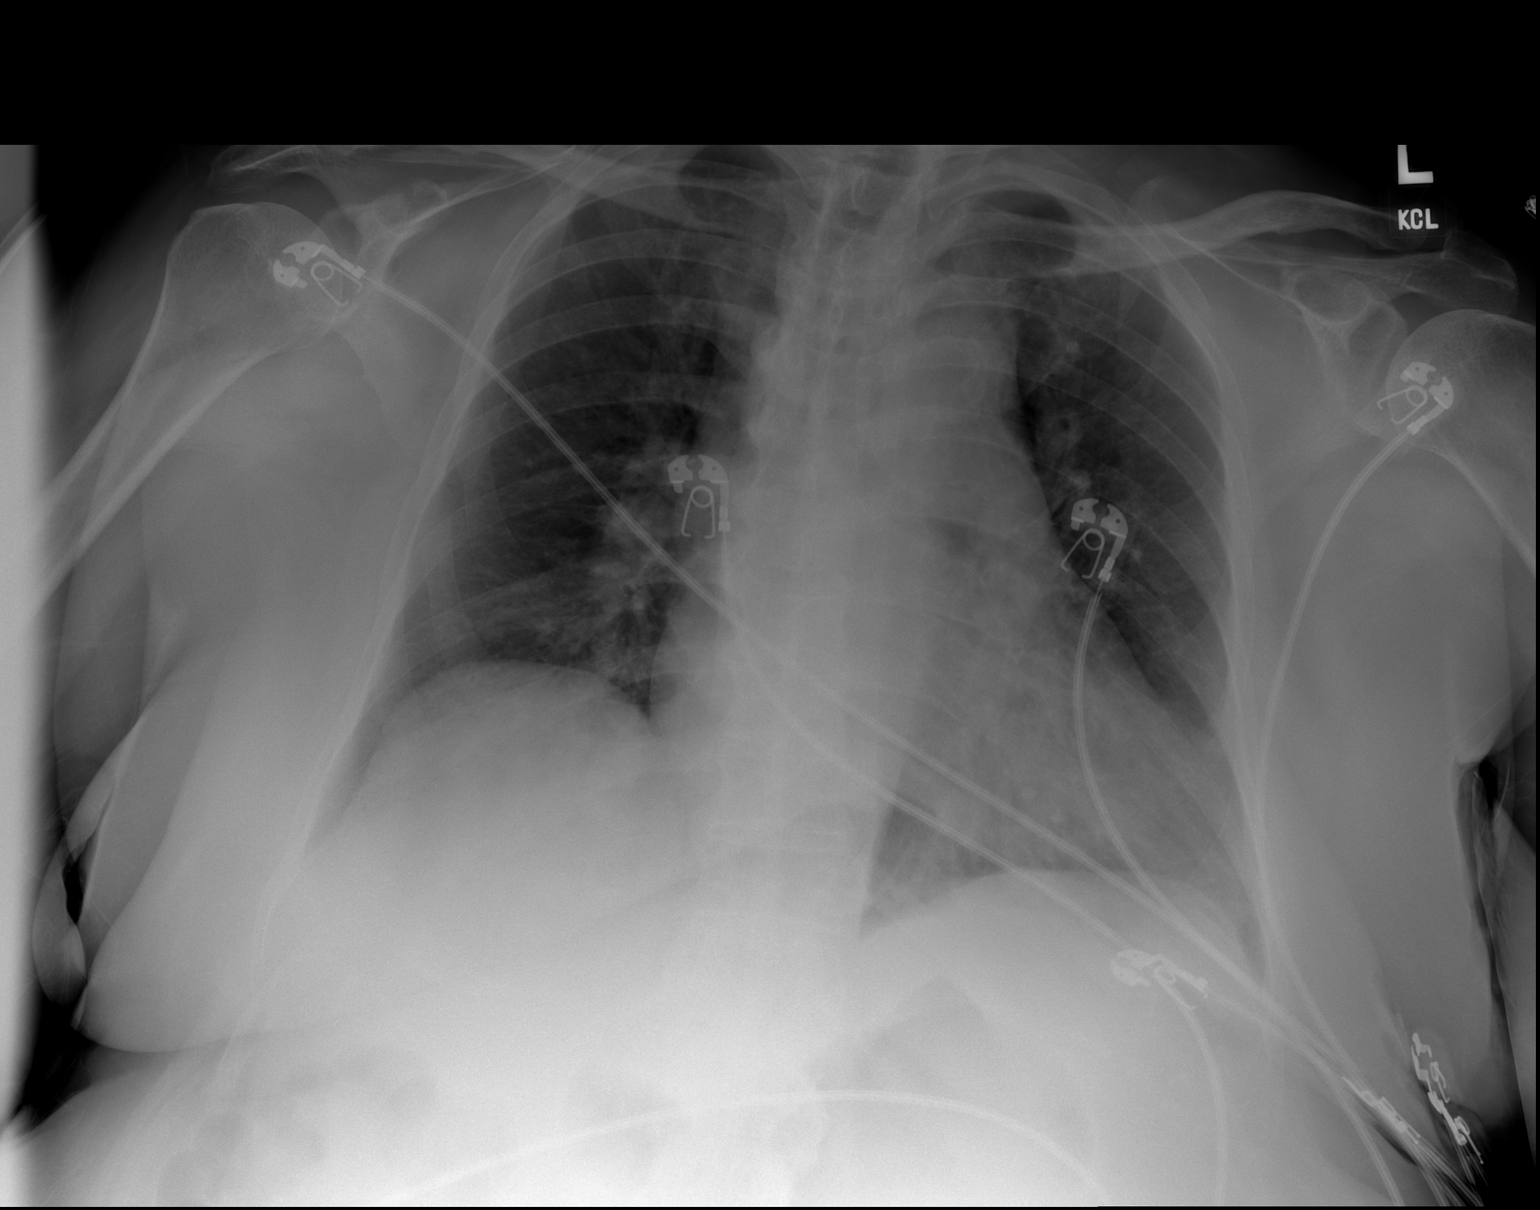

[2 of 2 positions shown; findings below may reference images not displayed]

FINDINGS: Mildly enlarged cardiac silhouette. Small amount of increased
density at the posterior costophrenic angles on the lateral view,
possibly in the medial aspect of the left lower lobe on the frontal
view. Otherwise, clear lungs. Thoracic and upper lumbar spine
degenerative changes.
IMPRESSION: 1. Probable mild left lower lobe atelectasis or pneumonia.
2. Mild cardiomegaly.

## 2018-10-18 MED ORDER — ALBUTEROL SULFATE (2.5 MG/3ML) 0.083% IN NEBU
5.0000 mg | INHALATION_SOLUTION | Freq: Once | RESPIRATORY_TRACT | Status: AC
  Administered 2018-10-18: 5 mg via RESPIRATORY_TRACT
  Filled 2018-10-18: qty 6

## 2018-10-18 MED ORDER — LEVOFLOXACIN IN D5W 750 MG/150ML IV SOLN
750.0000 mg | Freq: Once | INTRAVENOUS | Status: AC
  Administered 2018-10-18: 750 mg via INTRAVENOUS
  Filled 2018-10-18: qty 150

## 2018-10-18 MED ORDER — METHYLPREDNISOLONE SODIUM SUCC 125 MG IJ SOLR
125.0000 mg | Freq: Once | INTRAMUSCULAR | Status: AC
  Administered 2018-10-18: 125 mg via INTRAVENOUS
  Filled 2018-10-18: qty 2

## 2018-10-18 NOTE — ED Notes (Addendum)
Dr. Eulis Foster at bedside and is aware of pt's O2 saturation. Pt placed on 2L [place].

## 2018-10-18 NOTE — ED Notes (Signed)
Urine culture sent with urine sample. 

## 2018-10-18 NOTE — ED Notes (Signed)
Dr. Blaine Hamper at bedside. Pt's O2 improved to 93% on 2L [place] without intervention

## 2018-10-18 NOTE — ED Provider Notes (Signed)
[facility] DEPT Provider Note   CSN: [id number] Arrival date & time: 10/18/18  1931    History   Chief Complaint Chief Complaint  Patient presents with  . Shortness of Breath    HPI Crystal Ashley is a 73 y.o. female.     HPI   She presents for evaluation of shortness of breath which started today.  She feels like she is wheezing.  She does not currently use medications at home for wheezing.  He has a history of atrial fibrillation status post ablation, 7 months ago.  She continues to take Xarelto as an anticoagulant.  She denies fever, chills, cough, chest pain, weakness or dizziness.  She is here with her son.  There are no other known modifying factors.  Past Medical History:  Diagnosis Date  . Cancer ([place])   . Cataract   . Glaucoma   . Hypertension   . Thyroid disease     Patient Active Problem List   Diagnosis Date Noted  . Lobar pneumonia ([place]) 10/18/2018  . Sepsis ([place]) 10/18/2018  . Acute respiratory failure with hypoxia ([place]) 10/18/2018  . Declining mobility 09/23/2018  . B12 deficiency 09/23/2018  . Morbid obesity with BMI of 40.0-44.9, adult ([place]) 09/23/2018  . Anemia 09/23/2018  . History of cardioversion 04/26/2018  . Type 2 diabetes mellitus without complication, without long-term current use of insulin ([place]) 03/15/2018  . Atrial fibrillation ([place]) 03/15/2018  . Morbid obesity ([place]) 01/04/2017  . Hyperlipidemia associated with type 2 diabetes mellitus ([place]) 01/04/2017  . Vitamin D deficiency 12/15/2016  . Glaucoma   . Hypertension associated with diabetes ([place]) 04/19/2014  . Hypothyroidism 04/19/2014  . History of endometrial cancer 03/30/2014    Past Surgical History:  Procedure Laterality Date  . CARDIOVERSION N/A 04/08/2018   Procedure: CARDIOVERSION;  Surgeon: Adrian Prows, MD;  Location: [facility];  Service: Cardiovascular;  Laterality: N/A;  . FOOT SURGERY Left   . HYSTEROSCOPY     POLYPECTOMY     OB  History    Gravida  0   Para      Term      Preterm      AB      Living        SAB      TAB      Ectopic      Multiple      Live Births               Home Medications    Prior to Admission medications   Medication Sig Start Date End Date Taking? Authorizing Provider  amLODipine (NORVASC) 10 MG tablet Take 1 tablet (10 mg total) by mouth daily. 08/08/18  Yes Briscoe Deutscher, DO  atorvastatin (LIPITOR) 10 MG tablet TAKE 1 TABLET EVERY DAY 06/28/18  Yes Briscoe Deutscher, DO  cholecalciferol (VITAMIN D) 1000 UNITS tablet Take 5,000 Units by mouth every other day.    Yes [provider]  cyanocobalamin (,VITAMIN B-12,) 1000 MCG/ML injection 1000 mcg (1 mg) injection once per week for four weeks, followed by 1000 mcg injection once per month. 09/23/18  Yes Briscoe Deutscher, DO  dorzolamide-timolol (COSOPT) 22.3-6.8 MG/ML ophthalmic solution Place 1 drop into both eyes daily.  10/05/17  Yes [provider]  furosemide (LASIX) 40 MG tablet TAKE 1 TABLET EVERY DAY 07/30/18  Yes Briscoe Deutscher, DO  levothyroxine (SYNTHROID, LEVOTHROID) 75 MCG tablet TAKE 1 TABLET (75 MCG TOTAL) BY MOUTH DAILY. 04/26/18  Yes Briscoe Deutscher,  DO  metFORMIN (GLUCOPHAGE) 1000 MG tablet Take 1 tablet (1,000 mg total) by mouth 2 (two) times daily with a meal. Patient taking differently: Take 1,000 mg by mouth daily with breakfast.  07/12/18  Yes Briscoe Deutscher, DO  metoprolol succinate (TOPROL-XL) 100 MG 24 hr tablet Take 1 tablet (100 mg total) by mouth daily. Take with or immediately following a meal. 03/15/18  Yes Briscoe Deutscher, DO  travoprost, benzalkonium, (TRAVATAN) 0.004 % ophthalmic solution Place 1 drop into both eyes at bedtime.    Yes [provider]  valsartan (DIOVAN) 320 MG tablet Take 1 tablet (320 mg total) by mouth daily. 08/08/18  Yes Briscoe Deutscher, DO  XARELTO 20 MG TABS tablet TAKE 1 TABLET (20 MG TOTAL) BY MOUTH DAILY WITH SUPPER. Patient taking differently: Take  20 mg by mouth daily with supper. High risk med: Anticoagulant.  Crushed Xarelto can be given down a G-tube but NOT a J-Tube. 07/30/18  Yes Briscoe Deutscher, DO  amLODipine-valsartan (EXFORGE) 10-320 MG tablet Take 1 tablet by mouth every morning. Patient not taking: Reported on 10/18/2018 07/12/18   Briscoe Deutscher, DO  Syringe/Needle, Disp, (SYRINGE 3CC/25GX1") 25G X 1" 3 ML MISC Use one a week for 4 weeks then once a month after. 09/23/18   Briscoe Deutscher, DO    Family History Family History  Problem Relation Age of Onset  . Diabetes Son     Social History Social History   Tobacco Use  . Smoking status: Never Smoker  . Smokeless tobacco: Never Used  Substance Use Topics  . Alcohol use: No  . Drug use: Not on file     Allergies   Patient has no known allergies.   Review of Systems Review of Systems  All other systems reviewed and are negative.    Physical Exam Updated Vital Signs BP (!) 146/110   Pulse (!) 51   Temp 98.9 F (37.2 C)   Resp (!) 29   Ht 5' (1.524 m)   Wt 95.3 kg   SpO2 94%   BMI 41.01 kg/m   Physical Exam Vitals signs and nursing note reviewed.  Constitutional:      General: She is in acute distress.     Appearance: She is well-developed. She is obese. She is ill-appearing. She is not toxic-appearing or diaphoretic.  HENT:     Head: Normocephalic and atraumatic.     Right Ear: External ear normal.     Left Ear: External ear normal.  Eyes:     Conjunctiva/sclera: Conjunctivae normal.     Pupils: Pupils are equal, round, and reactive to light.  Neck:     Musculoskeletal: Normal range of motion and neck supple.     Trachea: Phonation normal.  Cardiovascular:     Rate and Rhythm: Regular rhythm. Tachycardia present.     Heart sounds: Normal heart sounds.  Pulmonary:     Effort: Pulmonary effort is normal. No respiratory distress.     Breath sounds: Normal breath sounds. No stridor. No rhonchi.     Comments: Auditory wheezing externally, with  auscultation no significant wheezing.  Somewhat decreased air bubble bilaterally.  Rales bilaterally entire lung. Chest:     Chest wall: No tenderness.  Abdominal:     General: There is no distension.     Palpations: Abdomen is soft. There is no mass.     Tenderness: There is no abdominal tenderness.  Musculoskeletal: Normal range of motion.     Right lower leg: No edema.  Left lower leg: No edema.  Skin:    General: Skin is warm and dry.  Neurological:     Mental Status: She is alert and oriented to person, place, and time.     Cranial Nerves: No cranial nerve deficit.     Sensory: No sensory deficit.     Motor: No abnormal muscle tone.     Coordination: Coordination normal.  Psychiatric:        Mood and Affect: Mood normal.        Behavior: Behavior normal.        Thought Content: Thought content normal.        Judgment: Judgment normal.      ED Treatments / Results  Labs (all labs ordered are listed, but only abnormal results are displayed) Labs Reviewed  COMPREHENSIVE METABOLIC PANEL - Abnormal; Notable for the following components:      Result Value   Glucose, Bld 249 (*)    Calcium 8.6 (*)    All other components within normal limits  BRAIN NATRIURETIC PEPTIDE - Abnormal; Notable for the following components:   B Natriuretic Peptide 397.1 (*)    All other components within normal limits  CBC WITH DIFFERENTIAL/PLATELET - Abnormal; Notable for the following components:   WBC 15.4 (*)    RBC 3.62 (*)    Hemoglobin 10.4 (*)    HCT 34.8 (*)    MCHC 29.9 (*)    Neutro Abs 12.8 (*)    Basophils Absolute 0.2 (*)    All other components within normal limits  BLOOD GAS, VENOUS - Abnormal; Notable for the following components:   pO2, Ven 54.6 (*)    All other components within normal limits  URINALYSIS, ROUTINE W REFLEX MICROSCOPIC - Abnormal; Notable for the following components:   Glucose, UA >=500 (*)    Hgb urine dipstick SMALL (*)    All other components  within normal limits  TROPONIN I  D-DIMER, QUANTITATIVE (NOT AT [facility])    EKG EKG Interpretation  Date/Time:  Tuesday October 18 2018 19:48:26 EST Ventricular Rate:  115 PR Interval:    QRS Duration: 70 QT Interval:  384 QTC Calculation: 532 R Axis:   -11 Text Interpretation:  Atrial fibrillation Low voltage, extremity and precordial leads Borderline repolarization abnormality Prolonged QT interval Since last tracing rhythym now Atrial fibrillation and QT has lengthened Confirmed by Daleen Bo [phone]) on 10/18/2018 7:55:06 PM   Radiology Dg Chest 2 View  Result Date: 10/18/2018 CLINICAL DATA:  Shortness of breath since this morning. Wheezing. EXAM: CHEST - 2 VIEW COMPARISON:  None. FINDINGS: Mildly enlarged cardiac silhouette. Small amount of increased density at the posterior costophrenic angles on the lateral view, possibly in the medial aspect of the left lower lobe on the frontal view. Otherwise, clear lungs. Thoracic and upper lumbar spine degenerative changes. IMPRESSION: 1. Probable mild left lower lobe atelectasis or pneumonia. 2. Mild cardiomegaly. Electronically Signed   By: Claudie Revering M.D.   On: 10/18/2018 20:27    Procedures .Critical Care Performed by: Daleen Bo, MD Authorized by: Daleen Bo, MD   Critical care provider statement:    Critical care time (minutes):  50   Critical care start time:  10/18/2018 7:50 PM   Critical care end time:  10/18/2018 11:23 PM   Critical care time was exclusive of:  Separately billable procedures and treating other patients   Critical care was necessary to treat or prevent imminent or life-threatening deterioration of the following conditions:  Respiratory failure   Critical care was time spent personally by me on the following activities:  Blood draw for specimens, development of treatment plan with patient or surrogate, discussions with consultants, evaluation of patient's response to treatment, examination of patient, obtaining  history from patient or surrogate, ordering and performing treatments and interventions, ordering and review of laboratory studies, pulse oximetry, re-evaluation of patient's condition, review of old charts and ordering and review of radiographic studies   (including critical care time)  Medications Ordered in ED Medications  levofloxacin (LEVAQUIN) IVPB 750 mg (750 mg Intravenous New Bag/Given 10/18/18 2320)  albuterol (PROVENTIL) (2.5 MG/3ML) 0.083% nebulizer solution 5 mg (5 mg Nebulization Given 10/18/18 1956)  methylPREDNISolone sodium succinate (SOLU-MEDROL) 125 mg/2 mL injection 125 mg (125 mg Intravenous Given 10/18/18 2240)     Initial Impression / Assessment and Plan / ED Course  I have reviewed the triage vital signs and the nursing notes.  Pertinent labs & imaging results that were available during my care of the patient were reviewed by me and considered in my medical decision making (see chart for details).  Clinical Course as of Oct 18 2322  Tue Oct 18, 2018  2007 She presented, hypoxic, 79% on room air, was placed on nasal cannula oxygen with improvement of her oxygen saturation to normal, 99%.   [EW]  2008 Tachycardic and tachypneic on arrival   [EW]  2222 Normal except elevated PCO2 on venous sample  Blood gas, venous(!) [EW]  2223 Normal  D-dimer, quantitative [EW]  2223 Normal  Troponin I - Once [EW]  2223 Normal except glucose high, calcium low  Comprehensive metabolic panel(!) [EW]  7169 Slightly elevated  Brain natriuretic peptide(!) [EW]  2223 Normal except white count high, hemoglobin low  CBC with Differential(!) [EW]  2223 Small left lower lobe pneumonia, images reviewed by me  DG Chest 2 View [EW]    Clinical Course User Index [EW] Daleen Bo, MD        Patient Vitals for the past 24 hrs:  BP Temp Pulse Resp SpO2 Height Weight  10/18/18 2300 (!) 146/110 - (!) 51 (!) 29 94 % - -  10/18/18 2242 - - 96 (!) 30 (!) 88 % - -  10/18/18 2239 (!)  146/102 - (!) 106 (!) 29 91 % - -  10/18/18 2030 129/69 - (!) 119 (!) 30 98 % - -  10/18/18 1956 - - - - 98 % - -  10/18/18 1948 118/76 98.9 F (37.2 C) (!) 119 (!) 30 99 % - -  10/18/18 1945 - - - - - 5' (1.524 m) 95.3 kg    CHA2DS2/VAS Stroke Risk Points  Current as of 4 minutes ago     4 >= 2 Points: High Risk  1 - 1.99 Points: Medium Risk  0 Points: Low Risk    The patient's score has not changed in the past year.:  No Change     Details    This score determines the patient's risk of having a stroke if the  patient has atrial fibrillation.       Points Metrics  0 Has Congestive Heart Failure:  No    Current as of 4 minutes ago  0 Has Vascular Disease:  No    Current as of 4 minutes ago  1 Has Hypertension:  Yes    Current as of 4 minutes ago  1 Age:  56    Current as of 4 minutes ago  1  Has Diabetes:  Yes    Current as of 4 minutes ago  0 Had Stroke:  No  Had TIA:  No  Had thromboembolism:  No    Current as of 4 minutes ago  1 Female:  Yes    Current as of 4 minutes ago           10:46 PM Reevaluation with update and discussion. After initial assessment and treatment, an updated evaluation reveals no additional complaints, findings discussed with patient and son.  10-minute trial off oxygen, caused the oxygen saturation to drop to 88%.  She was placed back on nasal cannula oxygen with improvement.  At this time lungs have decreased air movement bilaterally with now audible end expiratory wheezing.  Patient reports that she is feeling better.  Discussed findings with son, and patient, they agree to hospitalization for further care and treatment.  All questions answered. Daleen Bo   Medical Decision Making: Patient presenting with respiratory distress, and recurrent atrial fibrillation now with rapid ventricular response.  Patient had marked improvement with single nebulizer.  Solu-Medrol given for COPD exacerbation.  Small pneumonia, on imaging, treated with Levaquin.   No evidence for PE.  Hypoxia likely related to COPD exacerbation, not pneumonia.  She will require hospitalization for treatment and observation.  CRITICAL CARE-yes Performed by: Daleen Bo  Nursing Notes Reviewed/ Care Coordinated Applicable Imaging Reviewed Interpretation of Laboratory Data incorporated into ED treatment  10:49 PM-Consult complete with hospitalist. Patient case explained and discussed.  He agrees to admit patient for further evaluation and treatment. Call ended at 11:23 PM  Plan: Admit  Final Clinical Impressions(s) / ED Diagnoses   Final diagnoses:  Atrial fibrillation with RVR ([place])  Hypoxia  COPD exacerbation ([place])  Community acquired pneumonia of left lower lobe of lung [facility])    ED Discharge Orders    None       Daleen Bo, MD 10/18/18 2324

## 2018-10-18 NOTE — ED Triage Notes (Signed)
Patient states she has had sob since this morning. Patient is wheezing. Patient O2 Sat-79%. Patient put on 4L [place]. O2 SAT is 98%.

## 2018-10-18 NOTE — H&P (Signed)
History and Physical    Crystal Ashley PNT:[id number] DOB: 1945/10/20 DOA: 10/18/2018  Referring MD/NP/PA:   PCP: Briscoe Deutscher, DO   Patient coming from:  The patient is coming from home.  At baseline, pt is independent for most of ADL.        Chief Complaint: Shortness of breath  HPI: Crystal Ashley is a 73 y.o. female with medical history significant of hypertension, hyperlipidemia, diabetes mellitus, atrial fibrillation on Xarelto, stage I uterine cancer (s/p of hysterectomy), bilateral leg edema on Lasix, hypothyroidism, who presents with shortness of breath.  Patient states that her shortness of breath started yesterday morning, which has been progressively worsening.  Denies cough, chest pain, fever or chills.  Patient was found to have oxygen desaturation to 79% on room air, which improved to 98% on 4 L nasal cannula oxygen.  Patient denies nausea, vomiting, diarrhea, abdominal pain, symptoms of UTI or unilateral weakness.  Patient states that she is taking Lasix for leg edema, does not know if she has history of CHF.  ED Course: pt was found to have WBC 15.4, BNP 397.1, negative troponin, negative d-dimer, negative flu PCR, electrolytes renal function okay, VBG with pH 7.344, CO2 46.4, O2 54.6, temperature normal, tachycardia, tachypnea, chest x-ray showed mild cardiomegaly, possible left lower lobe infiltration.  Patient is admitted to telemetry bed as inpatient.  Review of Systems:   General: no fevers, chills, no body weight gain, has fatigue HEENT: no blurry vision, hearing changes or sore throat Respiratory: has dyspnea, no coughing, wheezing CV: no chest pain, no palpitations GI: no nausea, vomiting, abdominal pain, diarrhea, constipation GU: no dysuria, burning on urination, increased urinary frequency, hematuria  Ext: has leg edema Neuro: no unilateral weakness, numbness, or tingling, no vision change or hearing loss Skin: no rash, no skin tear. MSK: No muscle  spasm, no deformity, no limitation of range of movement in spin Heme: No easy bruising.  Travel history: No recent long distant travel.  Allergy: No Known Allergies  Past Medical History:  Diagnosis Date  . Cancer ([place])   . Cataract   . Glaucoma   . Hypertension   . Thyroid disease     Past Surgical History:  Procedure Laterality Date  . CARDIOVERSION N/A 04/08/2018   Procedure: CARDIOVERSION;  Surgeon: Adrian Prows, MD;  Location: [facility];  Service: Cardiovascular;  Laterality: N/A;  . FOOT SURGERY Left   . HYSTEROSCOPY     POLYPECTOMY    Social History:  reports that she has never smoked. She has never used smokeless tobacco. She reports that she does not drink alcohol. No history on file for drug.  Family History:  Family History  Problem Relation Age of Onset  . Diabetes Son      Prior to Admission medications   Medication Sig Start Date End Date Taking? Authorizing Provider  amLODipine (NORVASC) 10 MG tablet Take 1 tablet (10 mg total) by mouth daily. 08/08/18  Yes Briscoe Deutscher, DO  atorvastatin (LIPITOR) 10 MG tablet TAKE 1 TABLET EVERY DAY 06/28/18  Yes Briscoe Deutscher, DO  cholecalciferol (VITAMIN D) 1000 UNITS tablet Take 5,000 Units by mouth every other day.    Yes [provider]  cyanocobalamin (,VITAMIN B-12,) 1000 MCG/ML injection 1000 mcg (1 mg) injection once per week for four weeks, followed by 1000 mcg injection once per month. 09/23/18  Yes Briscoe Deutscher, DO  dorzolamide-timolol (COSOPT) 22.3-6.8 MG/ML ophthalmic solution Place 1 drop into both eyes daily.  10/05/17  Yes  [provider]  furosemide (LASIX) 40 MG tablet TAKE 1 TABLET EVERY DAY 07/30/18  Yes Briscoe Deutscher, DO  levothyroxine (SYNTHROID, LEVOTHROID) 75 MCG tablet TAKE 1 TABLET (75 MCG TOTAL) BY MOUTH DAILY. 04/26/18  Yes Briscoe Deutscher, DO  metFORMIN (GLUCOPHAGE) 1000 MG tablet Take 1 tablet (1,000 mg total) by mouth 2 (two) times daily with a meal. Patient taking  differently: Take 1,000 mg by mouth daily with breakfast.  07/12/18  Yes Briscoe Deutscher, DO  metoprolol succinate (TOPROL-XL) 100 MG 24 hr tablet Take 1 tablet (100 mg total) by mouth daily. Take with or immediately following a meal. 03/15/18  Yes Briscoe Deutscher, DO  travoprost, benzalkonium, (TRAVATAN) 0.004 % ophthalmic solution Place 1 drop into both eyes at bedtime.    Yes [provider]  valsartan (DIOVAN) 320 MG tablet Take 1 tablet (320 mg total) by mouth daily. 08/08/18  Yes Briscoe Deutscher, DO  XARELTO 20 MG TABS tablet TAKE 1 TABLET (20 MG TOTAL) BY MOUTH DAILY WITH SUPPER. Patient taking differently: Take 20 mg by mouth daily with supper. High risk med: Anticoagulant.  Crushed Xarelto can be given down a G-tube but NOT a J-Tube. 07/30/18  Yes Briscoe Deutscher, DO  amLODipine-valsartan (EXFORGE) 10-320 MG tablet Take 1 tablet by mouth every morning. Patient not taking: Reported on 10/18/2018 07/12/18   Briscoe Deutscher, DO  Syringe/Needle, Disp, (SYRINGE 3CC/25GX1") 25G X 1" 3 ML MISC Use one a week for 4 weeks then once a month after. 09/23/18   Briscoe Deutscher, DO    Physical Exam: Vitals:   10/18/18 2339 10/19/18 0141 10/19/18 0149 10/19/18 0513  BP:   125/76 121/75  Pulse: (!) 134  87 (!) 101  Resp: (!) 28  (!) 24 20  Temp:   98.6 F (37 C) 97.7 F (36.5 C)  TempSrc:   Oral Oral  SpO2: 93%  93% 97%  Weight:  94.8 kg    Height:  5' (1.524 m)     General: Not in acute distress HEENT:       Eyes: PERRL, EOMI, no scleral icterus.       ENT: No discharge from the ears and nose, no pharynx injection, no tonsillar enlargement.        Neck: No JVD, no bruit, no mass felt. Heme: No neck lymph node enlargement. Cardiac: S1/S2, RRR, No murmurs, No gallops or rubs. Respiratory: Severely decreased air movement bilaterally with mild wheezing GI: Soft, nondistended, nontender, no rebound pain, no organomegaly, BS present. GU: No hematuria Ext: 2+ pitting leg edema bilaterally.  2+DP/PT pulse bilaterally. Musculoskeletal: No joint deformities, No joint redness or warmth, no limitation of ROM in spin. Skin: No rashes.  Neuro: Alert, oriented X3, cranial nerves II-XII grossly intact, moves all extremities normally.    Psych: Patient is not psychotic, no suicidal or hemocidal ideation.  Labs on Admission: I have personally reviewed following labs and imaging studies  CBC: Recent Labs  Lab 10/18/18 2058  WBC 15.4*  NEUTROABS 12.8*  HGB 10.4*  HCT 34.8*  MCV 96.1  PLT 956   Basic Metabolic Panel: Recent Labs  Lab 10/18/18 2058  NA 136  K 4.5  CL 104  CO2 24  GLUCOSE 249*  BUN 15  CREATININE 0.94  CALCIUM 8.6*   GFR: Estimated Creatinine Clearance: 55.7 mL/min (by C-G formula based on SCr of 0.94 mg/dL). Liver Function Tests: Recent Labs  Lab 10/18/18 2058  AST 25  ALT 20  ALKPHOS 95  BILITOT 0.7  PROT 7.0  ALBUMIN 3.6   No results for input(s): LIPASE, AMYLASE in the last 168 hours. No results for input(s): AMMONIA in the last 168 hours. Coagulation Profile: No results for input(s): INR, PROTIME in the last 168 hours. Cardiac Enzymes: Recent Labs  Lab 10/18/18 2058  TROPONINI <0.03   BNP (last 3 results) No results for input(s): PROBNP in the last 8760 hours. HbA1C: No results for input(s): HGBA1C in the last 72 hours. CBG: No results for input(s): GLUCAP in the last 168 hours. Lipid Profile: No results for input(s): CHOL, HDL, LDLCALC, TRIG, CHOLHDL, LDLDIRECT in the last 72 hours. Thyroid Function Tests: No results for input(s): TSH, T4TOTAL, FREET4, T3FREE, THYROIDAB in the last 72 hours. Anemia Panel: No results for input(s): VITAMINB12, FOLATE, FERRITIN, TIBC, IRON, RETICCTPCT in the last 72 hours. Urine analysis:    Component Value Date/Time   COLORURINE YELLOW 10/18/2018 2259   APPEARANCEUR CLEAR 10/18/2018 2259   LABSPEC 1.016 10/18/2018 2259   PHURINE 5.0 10/18/2018 2259   GLUCOSEU >=500 (A) 10/18/2018 2259    HGBUR SMALL (A) 10/18/2018 2259   BILIRUBINUR NEGATIVE 10/18/2018 2259   KETONESUR NEGATIVE 10/18/2018 2259   PROTEINUR NEGATIVE 10/18/2018 2259   NITRITE NEGATIVE 10/18/2018 2259   LEUKOCYTESUR NEGATIVE 10/18/2018 2259   Sepsis Labs: @LABRCNTIP (procalcitonin:4,lacticidven:4) )No results found for this or any previous visit (from the past 240 hour(s)).   Radiological Exams on Admission: Dg Chest 2 View  Result Date: 10/18/2018 CLINICAL DATA:  Shortness of breath since this morning. Wheezing. EXAM: CHEST - 2 VIEW COMPARISON:  None. FINDINGS: Mildly enlarged cardiac silhouette. Small amount of increased density at the posterior costophrenic angles on the lateral view, possibly in the medial aspect of the left lower lobe on the frontal view. Otherwise, clear lungs. Thoracic and upper lumbar spine degenerative changes. IMPRESSION: 1. Probable mild left lower lobe atelectasis or pneumonia. 2. Mild cardiomegaly. Electronically Signed   By: Claudie Revering M.D.   On: 10/18/2018 20:27     EKG: Independently reviewed.  Sinus rhythm, QTC 532, low voltage, atrial fibrillation with RVR, early R wave progression, nonspecific T wave change.  Assessment/Plan Principal Problem:   Lobar pneumonia (HCC) Active Problems:   Hypothyroidism   Hyperlipidemia associated with type 2 diabetes mellitus (HCC)   Type 2 diabetes mellitus without complication, without long-term current use of insulin (HCC)   Atrial fibrillation with RVR (HCC)   Anemia   Sepsis (HCC)   Acute respiratory failure with hypoxia (HCC)   Acute bronchitis   HTN (hypertension)   Sepsis and acute respiratory failure with hypoxia due to lobar pneumonia vs. acute bronchitis: Chest x-ray showed possible left lower lobe infiltration for PNA  Patient has decreased air movement and no wheezing, indicating possible acute bronchitis.  Patient admits to for sepsis with leukocytosis, tachycardia and tachypnea.  Lactic acid elevated 2.4.  Since patient  has bilateral leg edema and elevated BNP, cannot rule out CHF, will not treat patient with aggressive IV fluid.  - will admit to tele bed as inpt - IV Rocephin and azithromycin (General received 1 dose of Levaquin in ED) - Mucinex for cough  - Atrovent nebulizer and Xopenex Neb prn for SOB - Solu-Medrol 60 mg 3 times daily - Urine legionella and S. pneumococcal antigen - Follow up blood culture x2, sputum culture, plus Flu pcr - will get Procalcitonin and trend lactic acid level per sepsis protocol - IVF: 500 mL of NS bolus in ED, followed by 75 mL per hour  of NS  - will get 2d echo   Hypothyroidism: -Synthroid  Hyperlipidemia associated with type 2 diabetes mellitus ([place]): -lipitor  Type 2 diabetes mellitus without complication, without long-term current use of insulin ([place]): Last A1c6.6 on 09/20/18, well controled. Patient is taking metformin at home -SSI  Atrial fibrillation with RVR ([place]): HR is upto 130s-->80s now. CHA2DS2-VASc Score is at least 4, needs oral anticoagulation. Patient is on Xarelto at home.  -Continue Xarelto and metoprolol   Anemia: Hgb 10.4, stable -f/u CBC  HTN:  -Continue home medications: Metoprolol -Hold home Lasix, Diovan and amlodipine due to high risk of developing hypotension secondary to sepsis -IV hydralazine prn   Inpatient status:  # Patient requires inpatient status due to high intensity of service, high risk for further deterioration and high frequency of surveillance required.  I certify that at the point of admission it is my clinical judgment that the patient will require inpatient hospital care spanning beyond 2 midnights from the point of admission.  . This patient has multiple chronic comorbidities including hypertension, hyperlipidemia, diabetes mellitus, atrial fibrillation on Xarelto, stage I uterine cancer (s/p of hysterectomy), bilateral leg edema, hypothyroidism . Now patient has presenting with acute respiratory failure with  hypoxia due to possible lobar pneumonia versus acute bronchitis . The worrisome physical exam findings include wheezing and decreased air movement bilaterally, bilateral leg edema, respiratory distress . The initial radiographic and laboratory data are worrisome because of leukocytosis, elevated BNP, elevated lactic acid, possible left lower lobe infiltration on chest x-ray.  Also has A. fib with RVR. . Current medical needs: please see my assessment and plan . Predictability of an adverse outcome (risk): Patient has a multiple comorbidities, now presents with sepsis due to lobar pneumonia versus acute bronchitis, also has atrial fibrillation with RVR.  Patient has elevated BNP and bilateral leg edema, cannot completely rule out CHF.  Patient's presentation is highly complicated and is at high risk of deteriorating.  Patient needs to be treated in hospital for at least 2 days.     DVT ppx: On Xarelto Code Status: Full code Family Communication:   Yes, patient's son   at bed side Disposition Plan:  Anticipate discharge back to previous home environment Consults called: None Admission status: Inpatient/tele    Date of Service 10/19/2018    [facility]   If 7PM-7AM, please contact night-coverage [email or web address] Password [facility] 10/19/2018, 5:47 AM

## 2018-10-19 ENCOUNTER — Inpatient Hospital Stay (HOSPITAL_COMMUNITY): Payer: Medicare Other

## 2018-10-19 ENCOUNTER — Ambulatory Visit: Payer: Self-pay | Admitting: Cardiology

## 2018-10-19 ENCOUNTER — Encounter (HOSPITAL_COMMUNITY): Payer: Self-pay

## 2018-10-19 DIAGNOSIS — R0609 Other forms of dyspnea: Secondary | ICD-10-CM | POA: Diagnosis not present

## 2018-10-19 DIAGNOSIS — I10 Essential (primary) hypertension: Secondary | ICD-10-CM | POA: Diagnosis not present

## 2018-10-19 DIAGNOSIS — Z6841 Body Mass Index (BMI) 40.0 and over, adult: Secondary | ICD-10-CM | POA: Diagnosis not present

## 2018-10-19 DIAGNOSIS — E119 Type 2 diabetes mellitus without complications: Secondary | ICD-10-CM | POA: Diagnosis not present

## 2018-10-19 DIAGNOSIS — J441 Chronic obstructive pulmonary disease with (acute) exacerbation: Secondary | ICD-10-CM | POA: Diagnosis present

## 2018-10-19 DIAGNOSIS — I5042 Chronic combined systolic (congestive) and diastolic (congestive) heart failure: Secondary | ICD-10-CM

## 2018-10-19 DIAGNOSIS — I110 Hypertensive heart disease with heart failure: Secondary | ICD-10-CM | POA: Diagnosis present

## 2018-10-19 DIAGNOSIS — J181 Lobar pneumonia, unspecified organism: Secondary | ICD-10-CM | POA: Diagnosis present

## 2018-10-19 DIAGNOSIS — Z7901 Long term (current) use of anticoagulants: Secondary | ICD-10-CM | POA: Diagnosis not present

## 2018-10-19 DIAGNOSIS — I480 Paroxysmal atrial fibrillation: Secondary | ICD-10-CM | POA: Diagnosis present

## 2018-10-19 DIAGNOSIS — A419 Sepsis, unspecified organism: Secondary | ICD-10-CM | POA: Diagnosis present

## 2018-10-19 DIAGNOSIS — J9601 Acute respiratory failure with hypoxia: Secondary | ICD-10-CM | POA: Diagnosis present

## 2018-10-19 DIAGNOSIS — Z8542 Personal history of malignant neoplasm of other parts of uterus: Secondary | ICD-10-CM | POA: Diagnosis not present

## 2018-10-19 DIAGNOSIS — J440 Chronic obstructive pulmonary disease with acute lower respiratory infection: Secondary | ICD-10-CM | POA: Diagnosis present

## 2018-10-19 DIAGNOSIS — E1169 Type 2 diabetes mellitus with other specified complication: Secondary | ICD-10-CM | POA: Diagnosis present

## 2018-10-19 DIAGNOSIS — Z7984 Long term (current) use of oral hypoglycemic drugs: Secondary | ICD-10-CM | POA: Diagnosis not present

## 2018-10-19 DIAGNOSIS — I4891 Unspecified atrial fibrillation: Secondary | ICD-10-CM | POA: Diagnosis not present

## 2018-10-19 DIAGNOSIS — Z79890 Hormone replacement therapy (postmenopausal): Secondary | ICD-10-CM | POA: Diagnosis not present

## 2018-10-19 DIAGNOSIS — R0902 Hypoxemia: Secondary | ICD-10-CM | POA: Diagnosis not present

## 2018-10-19 DIAGNOSIS — Z833 Family history of diabetes mellitus: Secondary | ICD-10-CM | POA: Diagnosis not present

## 2018-10-19 DIAGNOSIS — E785 Hyperlipidemia, unspecified: Secondary | ICD-10-CM | POA: Diagnosis present

## 2018-10-19 DIAGNOSIS — D638 Anemia in other chronic diseases classified elsewhere: Secondary | ICD-10-CM | POA: Diagnosis present

## 2018-10-19 DIAGNOSIS — H409 Unspecified glaucoma: Secondary | ICD-10-CM | POA: Diagnosis present

## 2018-10-19 DIAGNOSIS — E039 Hypothyroidism, unspecified: Secondary | ICD-10-CM | POA: Diagnosis present

## 2018-10-19 DIAGNOSIS — I272 Pulmonary hypertension, unspecified: Secondary | ICD-10-CM | POA: Diagnosis present

## 2018-10-19 DIAGNOSIS — Z90710 Acquired absence of both cervix and uterus: Secondary | ICD-10-CM | POA: Diagnosis not present

## 2018-10-19 DIAGNOSIS — J209 Acute bronchitis, unspecified: Secondary | ICD-10-CM | POA: Diagnosis present

## 2018-10-19 DIAGNOSIS — I5043 Acute on chronic combined systolic (congestive) and diastolic (congestive) heart failure: Secondary | ICD-10-CM | POA: Diagnosis present

## 2018-10-19 DIAGNOSIS — Z79899 Other long term (current) drug therapy: Secondary | ICD-10-CM | POA: Diagnosis not present

## 2018-10-19 LAB — CBC
HCT: 40.3 % (ref 36.0–46.0)
Hemoglobin: 12.2 g/dL (ref 12.0–15.0)
MCH: 28.7 pg (ref 26.0–34.0)
MCHC: 30.3 g/dL (ref 30.0–36.0)
MCV: 94.8 fL (ref 80.0–100.0)
Platelets: 285 10*3/uL (ref 150–400)
RBC: 4.25 MIL/uL (ref 3.87–5.11)
RDW: 15.5 % (ref 11.5–15.5)
WBC: 11.9 10*3/uL — ABNORMAL HIGH (ref 4.0–10.5)
nRBC: 0 % (ref 0.0–0.2)

## 2018-10-19 LAB — LACTIC ACID, PLASMA
Lactic Acid, Venous: 2.4 mmol/L (ref 0.5–1.9)
Lactic Acid, Venous: 1.6 mmol/L (ref 0.5–1.9)

## 2018-10-19 LAB — INFLUENZA PANEL BY PCR (TYPE A & B)
Influenza A By PCR: NEGATIVE
Influenza B By PCR: NEGATIVE

## 2018-10-19 LAB — BASIC METABOLIC PANEL
Anion gap: 10 (ref 5–15)
BUN: 16 mg/dL (ref 8–23)
CO2: 24 mmol/L (ref 22–32)
Calcium: 8.9 mg/dL (ref 8.9–10.3)
Chloride: 104 mmol/L (ref 98–111)
Creatinine, Ser: 0.96 mg/dL (ref 0.44–1.00)
GFR calc Af Amer: >60 mL/min (ref >60)
GFR calc non Af Amer: 59 mL/min — ABNORMAL LOW (ref >60)
Glucose, Bld: 206 mg/dL — ABNORMAL HIGH (ref 70–99)
Potassium: 4.4 mmol/L (ref 3.5–5.1)
Sodium: 138 mmol/L (ref 135–145)

## 2018-10-19 LAB — PROCALCITONIN: Procalcitonin: 0.14 ng/mL

## 2018-10-19 LAB — ECHOCARDIOGRAM COMPLETE
Height: 60 in
Weight: 3343.94 oz

## 2018-10-19 LAB — GLUCOSE, CAPILLARY
Glucose-Capillary: 165 mg/dL — ABNORMAL HIGH (ref 70–99)
Glucose-Capillary: 225 mg/dL — ABNORMAL HIGH (ref 70–99)
Glucose-Capillary: 193 mg/dL — ABNORMAL HIGH (ref 70–99)
Glucose-Capillary: 202 mg/dL — ABNORMAL HIGH (ref 70–99)

## 2018-10-19 LAB — HIV ANTIBODY (ROUTINE TESTING W REFLEX): HIV Screen 4th Generation wRfx: NONREACTIVE

## 2018-10-19 LAB — STREP PNEUMONIAE URINARY ANTIGEN: Strep Pneumo Urinary Antigen: NEGATIVE

## 2018-10-19 MED ORDER — IPRATROPIUM BROMIDE 0.02 % IN SOLN
0.5000 mg | Freq: Four times a day (QID) | RESPIRATORY_TRACT | Status: DC
  Administered 2018-10-19 (×2): 0.5 mg via RESPIRATORY_TRACT
  Filled 2018-10-19 (×3): qty 2.5

## 2018-10-19 MED ORDER — SODIUM CHLORIDE 0.9 % IV BOLUS: 500.0000 mL | Freq: Once | INTRAVENOUS | Status: DC

## 2018-10-19 MED ORDER — SODIUM CHLORIDE 0.9 % IV SOLN
1.0000 g | Freq: Every day | INTRAVENOUS | Status: DC
  Administered 2018-10-19 (×2): 1 g via INTRAVENOUS
  Filled 2018-10-19 (×2): qty 1

## 2018-10-19 MED ORDER — LEVOTHYROXINE SODIUM 75 MCG PO TABS
75.0000 ug | ORAL_TABLET | Freq: Every day | ORAL | Status: DC
  Administered 2018-10-19 – 2018-10-21 (×3): 75 ug via ORAL
  Filled 2018-10-19: qty 1
  Filled 2018-10-19: qty 3
  Filled 2018-10-19 (×2): qty 1
  Filled 2018-10-19 (×2): qty 3

## 2018-10-19 MED ORDER — IPRATROPIUM BROMIDE 0.02 % IN SOLN
0.5000 mg | RESPIRATORY_TRACT | Status: DC
  Administered 2018-10-19: 0.5 mg via RESPIRATORY_TRACT
  Filled 2018-10-19: qty 2.5

## 2018-10-19 MED ORDER — ATORVASTATIN CALCIUM 10 MG PO TABS
10.0000 mg | ORAL_TABLET | Freq: Every day | ORAL | Status: DC
  Administered 2018-10-19 – 2018-10-20 (×2): 10 mg via ORAL
  Filled 2018-10-19 (×2): qty 1

## 2018-10-19 MED ORDER — LATANOPROST 0.005 % OP SOLN
1.0000 [drp] | Freq: Every day | OPHTHALMIC | Status: DC
  Administered 2018-10-19 – 2018-10-20 (×3): 1 [drp] via OPHTHALMIC
  Filled 2018-10-19: qty 2.5

## 2018-10-19 MED ORDER — INSULIN ASPART 100 UNIT/ML ~~LOC~~ SOLN
0.0000 [IU] | Freq: Every day | SUBCUTANEOUS | Status: DC
  Administered 2018-10-19: 2 [IU] via SUBCUTANEOUS

## 2018-10-19 MED ORDER — INSULIN ASPART 100 UNIT/ML ~~LOC~~ SOLN
0.0000 [IU] | Freq: Three times a day (TID) | SUBCUTANEOUS | Status: DC
  Administered 2018-10-19: 2 [IU] via SUBCUTANEOUS
  Administered 2018-10-19: 3 [IU] via SUBCUTANEOUS
  Administered 2018-10-19 – 2018-10-20 (×2): 2 [IU] via SUBCUTANEOUS
  Administered 2018-10-20: 3 [IU] via SUBCUTANEOUS

## 2018-10-19 MED ORDER — LEVALBUTEROL HCL 1.25 MG/0.5ML IN NEBU
1.2500 mg | INHALATION_SOLUTION | Freq: Four times a day (QID) | RESPIRATORY_TRACT | Status: DC
  Administered 2018-10-19 (×3): 1.25 mg via RESPIRATORY_TRACT
  Filled 2018-10-19 (×4): qty 0.5

## 2018-10-19 MED ORDER — PREDNISONE 20 MG PO TABS
20.0000 mg | ORAL_TABLET | Freq: Every day | ORAL | Status: DC
  Administered 2018-10-20: 20 mg via ORAL
  Filled 2018-10-19: qty 1

## 2018-10-19 MED ORDER — DM-GUAIFENESIN ER 30-600 MG PO TB12: 1.0000 | ORAL_TABLET | Freq: Two times a day (BID) | ORAL | Status: DC | PRN

## 2018-10-19 MED ORDER — LEVALBUTEROL HCL 1.25 MG/0.5ML IN NEBU
1.2500 mg | INHALATION_SOLUTION | Freq: Four times a day (QID) | RESPIRATORY_TRACT | Status: DC
  Administered 2018-10-20 (×4): 1.25 mg via RESPIRATORY_TRACT
  Filled 2018-10-19 (×5): qty 0.5

## 2018-10-19 MED ORDER — LEVALBUTEROL HCL 0.63 MG/3ML IN NEBU: 0.6300 mg | INHALATION_SOLUTION | Freq: Four times a day (QID) | RESPIRATORY_TRACT | Status: DC | PRN

## 2018-10-19 MED ORDER — ORAL CARE MOUTH RINSE
15.0000 mL | Freq: Two times a day (BID) | OROMUCOSAL | Status: DC
  Administered 2018-10-19 – 2018-10-20 (×4): 15 mL via OROMUCOSAL

## 2018-10-19 MED ORDER — RIVAROXABAN 20 MG PO TABS
20.0000 mg | ORAL_TABLET | Freq: Every day | ORAL | Status: DC
  Administered 2018-10-19 – 2018-10-20 (×2): 20 mg via ORAL
  Filled 2018-10-19 (×2): qty 1

## 2018-10-19 MED ORDER — IPRATROPIUM BROMIDE 0.02 % IN SOLN
0.5000 mg | Freq: Four times a day (QID) | RESPIRATORY_TRACT | Status: DC
  Administered 2018-10-20 (×4): 0.5 mg via RESPIRATORY_TRACT
  Filled 2018-10-19 (×5): qty 2.5

## 2018-10-19 MED ORDER — SODIUM CHLORIDE 0.9 % IV SOLN
INTRAVENOUS | Status: DC
  Administered 2018-10-19: 05:00:00 via INTRAVENOUS

## 2018-10-19 MED ORDER — ACETAMINOPHEN 325 MG PO TABS: 650.0000 mg | ORAL_TABLET | Freq: Four times a day (QID) | ORAL | Status: DC | PRN

## 2018-10-19 MED ORDER — VITAMIN D 25 MCG (1000 UNIT) PO TABS
5000.0000 [IU] | ORAL_TABLET | ORAL | Status: DC
  Administered 2018-10-19 – 2018-10-21 (×2): 5000 [IU] via ORAL
  Filled 2018-10-19 (×2): qty 5

## 2018-10-19 MED ORDER — SODIUM CHLORIDE 0.9 % IV SOLN
500.0000 mg | Freq: Every day | INTRAVENOUS | Status: DC
  Administered 2018-10-19: 500 mg via INTRAVENOUS
  Filled 2018-10-19: qty 500

## 2018-10-19 MED ORDER — HYDROXYZINE HCL 10 MG PO TABS: 10.0000 mg | ORAL_TABLET | Freq: Three times a day (TID) | ORAL | Status: DC | PRN

## 2018-10-19 MED ORDER — DORZOLAMIDE HCL-TIMOLOL MAL 2-0.5 % OP SOLN
1.0000 [drp] | Freq: Every day | OPHTHALMIC | Status: DC
  Administered 2018-10-19 – 2018-10-21 (×3): 1 [drp] via OPHTHALMIC
  Filled 2018-10-19: qty 10

## 2018-10-19 MED ORDER — HYDRALAZINE HCL 20 MG/ML IJ SOLN: 5.0000 mg | INTRAMUSCULAR | Status: DC | PRN

## 2018-10-19 MED ORDER — METOPROLOL SUCCINATE ER 100 MG PO TB24
100.0000 mg | ORAL_TABLET | Freq: Every day | ORAL | Status: DC
  Administered 2018-10-19 – 2018-10-21 (×3): 100 mg via ORAL
  Filled 2018-10-19 (×3): qty 1

## 2018-10-19 MED ORDER — FUROSEMIDE 40 MG PO TABS: 40.0000 mg | ORAL_TABLET | Freq: Every day | ORAL | Status: DC

## 2018-10-19 MED ORDER — METHYLPREDNISOLONE SODIUM SUCC 125 MG IJ SOLR
60.0000 mg | Freq: Three times a day (TID) | INTRAMUSCULAR | Status: DC
  Administered 2018-10-19: 60 mg via INTRAVENOUS
  Filled 2018-10-19: qty 2

## 2018-10-19 NOTE — ED Notes (Signed)
ED TO INPATIENT HANDOFF REPORT  ED Nurse Name and Phone #: Kerin Ransom, [phone]  S Name/Age/Gender Crystal Ashley 73 y.o. female Room/Bed: WA06/WA06  Code Status   Code Status: Full Code  Home/SNF/Other Home Patient oriented to: self, place, time and situation Is this baseline? Yes   Triage Complete: Triage complete  Chief Complaint Oxygen Level 84%/SOB  Triage Note Patient states she has had sob since this morning. Patient is wheezing. Patient O2 Sat-79%. Patient put on 4L [place]. O2 SAT is 98%.   Allergies No Known Allergies  Level of Care/Admitting Diagnosis ED Disposition    ED Disposition Condition Comment   Admit  Hospital Area: [facility] [[id number]]  Level of Care: Telemetry [5]  Admit to tele based on following criteria: Other see comments  Comments: SOB  Diagnosis: Lobar pneumonia [facility]) [[id number]]  Admitting Physician: Ivor Costa [place]  Attending Physician: Ivor Costa [phone]  Estimated length of stay: past midnight tomorrow  Certification:: I certify this patient will need inpatient services for at least 2 midnights  PT Class (Do Not Modify): Inpatient [101]  PT Acc Code (Do Not Modify): Private [1]       B Medical/Surgery History Past Medical History:  Diagnosis Date  . Cancer ([place])   . Cataract   . Glaucoma   . Hypertension   . Thyroid disease    Past Surgical History:  Procedure Laterality Date  . CARDIOVERSION N/A 04/08/2018   Procedure: CARDIOVERSION;  Surgeon: Adrian Prows, MD;  Location: [facility];  Service: Cardiovascular;  Laterality: N/A;  . FOOT SURGERY Left   . HYSTEROSCOPY     POLYPECTOMY     A IV Location/Drains/Wounds Patient Lines/Drains/Airways Status   Active Line/Drains/Airways    Name:   Placement date:   Placement time:   Site:   Days:   Peripheral IV 10/18/18 Right;Anterior Wrist   10/18/18    2100    Wrist   1   Airway   04/08/18    0806     194   Airway   -    -               Intake/Output Last  24 hours  Intake/Output Summary (Last 24 hours) at 10/19/2018 0057 Last data filed at 10/18/2018 2343 Gross per 24 hour  Intake 36.55 ml  Output 75 ml  Net -38.45 ml    Labs/Imaging Results for orders placed or performed during the hospital encounter of 10/18/18 (from the past 48 hour(s))  Comprehensive metabolic panel     Status: Abnormal   Collection Time: 10/18/18  8:58 PM  Result Value Ref Range   Sodium 136 135 - 145 mmol/L   Potassium 4.5 3.5 - 5.1 mmol/L   Chloride 104 98 - 111 mmol/L   CO2 24 22 - 32 mmol/L   Glucose, Bld 249 (H) 70 - 99 mg/dL   BUN 15 8 - 23 mg/dL   Creatinine, Ser 0.94 0.44 - 1.00 mg/dL   Calcium 8.6 (L) 8.9 - 10.3 mg/dL   Total Protein 7.0 6.5 - 8.1 g/dL   Albumin 3.6 3.5 - 5.0 g/dL   AST 25 15 - 41 U/L   ALT 20 0 - 44 U/L   Alkaline Phosphatase 95 38 - 126 U/L   Total Bilirubin 0.7 0.3 - 1.2 mg/dL   GFR calc non Af Amer >60 >60 mL/min   GFR calc Af Amer >60 >60 mL/min   Anion gap 8 5 - 15  Comment: Performed at [facility], [place] [address] [place], [place] [address]  Troponin I - Once     Status: None   Collection Time: 10/18/18  8:58 PM  Result Value Ref Range   Troponin I <0.03 <0.03 ng/mL    Comment: Performed at [facility], [place] [address] [place], [place] [address]  Brain natriuretic peptide     Status: Abnormal   Collection Time: 10/18/18  8:58 PM  Result Value Ref Range   B Natriuretic Peptide 397.1 (H) 0.0 - 100.0 pg/mL    Comment: Performed at [facility], [place] [address], [place], [place] [address]  CBC with Differential     Status: Abnormal   Collection Time: 10/18/18  8:58 PM  Result Value Ref Range   WBC 15.4 (H) 4.0 - 10.5 K/uL   RBC 3.62 (L) 3.87 - 5.11 MIL/uL   Hemoglobin 10.4 (L) 12.0 - 15.0 g/dL   HCT 34.8 (L) 36.0 - 46.0 %   MCV 96.1 80.0 - 100.0 fL   MCH 28.7 26.0 - 34.0 pg   MCHC 29.9 (L) 30.0 - 36.0 g/dL   RDW 15.3 11.5 - 15.5 %   Platelets 319 150 - 400  K/uL   nRBC 0.0 0.0 - 0.2 %   Neutrophils Relative % 82 %   Neutro Abs 12.8 (H) 1.7 - 7.7 K/uL   Band Neutrophils 1 %   Lymphocytes Relative 13 %   Lymphs Abs 2.0 0.7 - 4.0 K/uL   Monocytes Relative 3 %   Monocytes Absolute 0.5 0.1 - 1.0 K/uL   Eosinophils Relative 0 %   Eosinophils Absolute 0.0 0.0 - 0.5 K/uL   Basophils Relative 1 %   Basophils Absolute 0.2 (H) 0.0 - 0.1 K/uL   WBC Morphology MILD LEFT SHIFT (1-5% METAS, OCC MYELO, OCC BANDS)    Abs Immature Granulocytes 0.00 0.00 - 0.07 K/uL   Dohle Bodies PRESENT     Comment: Performed at [facility], [place] [address] [place], [place] [address]  D-dimer, quantitative     Status: None   Collection Time: 10/18/18  8:58 PM  Result Value Ref Range   D-Dimer, Quant 0.30 0.00 - 0.50 ug/mL-FEU    Comment: (NOTE) At the manufacturer cut-off of 0.50 ug/mL FEU, this assay has been documented to exclude PE with a sensitivity and negative predictive value of 97 to 99%.  At this time, this assay has not been approved by the FDA to exclude DVT/VTE. Results should be correlated with clinical presentation. Performed at [facility], [place] [address], [place], [place] [address]   Blood gas, venous     Status: Abnormal   Collection Time: 10/18/18  9:03 PM  Result Value Ref Range   O2 Content 4.0 L/min   Delivery systems NASAL CANNULA    pH, Ven 7.344 7.250 - 7.430   pCO2, Ven 46.4 44.0 - 60.0 mmHg   pO2, Ven 54.6 (H) 32.0 - 45.0 mmHg   Bicarbonate 24.5 20.0 - 28.0 mmol/L   Acid-base deficit 0.8 0.0 - 2.0 mmol/L   O2 Saturation 83.1 %   Patient temperature 98.9    Collection site VEIN    Drawn by DRAWN BY RN    Sample type VENOUS     Comment: Performed at [place] [address] [place], [place] [address]  Urinalysis, Routine w reflex microscopic     Status: Abnormal   Collection Time: 10/18/18 10:59  PM  Result Value Ref Range   Color, Urine YELLOW YELLOW   APPearance  CLEAR CLEAR   Specific Gravity, Urine 1.016 1.005 - 1.030   pH 5.0 5.0 - 8.0   Glucose, UA >=500 (A) NEGATIVE mg/dL   Hgb urine dipstick SMALL (A) NEGATIVE   Bilirubin Urine NEGATIVE NEGATIVE   Ketones, ur NEGATIVE NEGATIVE mg/dL   Protein, ur NEGATIVE NEGATIVE mg/dL   Nitrite NEGATIVE NEGATIVE   Leukocytes,Ua NEGATIVE NEGATIVE   RBC / HPF 0-5 0 - 5 RBC/hpf   WBC, UA 0-5 0 - 5 WBC/hpf   Bacteria, UA NONE SEEN NONE SEEN   Squamous Epithelial / LPF 0-5 0 - 5    Comment: Performed at [facility], [place] [address] [place], [place] [address]   Dg Chest 2 View  Result Date: 10/18/2018 CLINICAL DATA:  Shortness of breath since this morning. Wheezing. EXAM: CHEST - 2 VIEW COMPARISON:  None. FINDINGS: Mildly enlarged cardiac silhouette. Small amount of increased density at the posterior costophrenic angles on the lateral view, possibly in the medial aspect of the left lower lobe on the frontal view. Otherwise, clear lungs. Thoracic and upper lumbar spine degenerative changes. IMPRESSION: 1. Probable mild left lower lobe atelectasis or pneumonia. 2. Mild cardiomegaly. Electronically Signed   By: Claudie Revering M.D.   On: 10/18/2018 20:27    Pending Labs Unresulted Labs (From admission, onward)    Start     Ordered   10/19/18 0009  Legionella Pneumophila Serogp 1 Ur Ag  Once,   R     10/19/18 0009   10/19/18 0008  Culture, sputum-assessment  Once,   R     10/19/18 0009   10/19/18 0008  Gram stain  Once,   R     10/19/18 0009   10/19/18 0008  HIV antibody (Routine Screening)  Once,   R     10/19/18 0009   10/19/18 0008  Strep pneumoniae urinary antigen  Once,   R     10/19/18 0009   10/19/18 0007  Culture, blood (x 2)  BLOOD CULTURE X 2,   STAT    Comments:  INITIATE ANTIBIOTICS WITHIN 1 HOUR AFTER BLOOD CULTURES DRAWN.  If unable to obtain blood cultures, call MD immediately regarding antibiotic instructions.    10/19/18 0006   10/19/18 0007  Lactic acid, plasma  STAT Now  then every 3 hours,   STAT     10/19/18 0006   10/19/18 0007  Procalcitonin  ONCE - STAT,   R     10/19/18 0006   10/19/18 0002  Influenza panel by PCR (type A & B)  Once,   R     10/19/18 0001          Vitals/Pain Today's Vitals   10/18/18 2242 10/18/18 2300 10/18/18 2330 10/18/18 2339  BP:  (!) 146/110 (!) 142/94   Pulse: 96 (!) 51 (!) 150 (!) 134  Resp: (!) 30 (!) 29 (!) 24 (!) 28  Temp:      SpO2: (!) 88% 94% (!) 87% 93%  Weight:      Height:      PainSc:        Isolation Precautions No active isolations  Medications Medications  methylPREDNISolone sodium succinate (SOLU-MEDROL) 125 mg/2 mL injection 60 mg (has no administration in time range)  levalbuterol (XOPENEX) nebulizer solution 1.25 mg (1.25 mg Nebulization Given 10/19/18 0017)  dextromethorphan-guaiFENesin (MUCINEX DM) 30-600 MG per 12 hr tablet 1 tablet (  has no administration in time range)  atorvastatin (LIPITOR) tablet 10 mg (has no administration in time range)  furosemide (LASIX) tablet 40 mg (has no administration in time range)  metoprolol succinate (TOPROL-XL) 24 hr tablet 100 mg (has no administration in time range)  levothyroxine (SYNTHROID, LEVOTHROID) tablet 75 mcg (has no administration in time range)  rivaroxaban (XARELTO) tablet 20 mg (has no administration in time range)  cholecalciferol (VITAMIN D) tablet 5,000 Units (has no administration in time range)  dorzolamide-timolol (COSOPT) 22.3-6.8 MG/ML ophthalmic solution 1 drop (has no administration in time range)  travoprost (benzalkonium) (TRAVATAN) 0.004 % ophthalmic solution 1 drop (has no administration in time range)  cefTRIAXone (ROCEPHIN) 1 g in sodium chloride 0.9 % 100 mL IVPB (has no administration in time range)  azithromycin (ZITHROMAX) 500 mg in sodium chloride 0.9 % 250 mL IVPB (has no administration in time range)  hydrALAZINE (APRESOLINE) injection 5 mg (has no administration in time range)  hydrOXYzine (ATARAX/VISTARIL) tablet 10  mg (has no administration in time range)  acetaminophen (TYLENOL) tablet 650 mg (has no administration in time range)  ipratropium (ATROVENT) nebulizer solution 0.5 mg (has no administration in time range)  albuterol (PROVENTIL) (2.5 MG/3ML) 0.083% nebulizer solution 5 mg (5 mg Nebulization Given 10/18/18 1956)  methylPREDNISolone sodium succinate (SOLU-MEDROL) 125 mg/2 mL injection 125 mg (125 mg Intravenous Given 10/18/18 2240)  levofloxacin (LEVAQUIN) IVPB 750 mg (0 mg Intravenous Stopped 10/19/18 0052)    Mobility walks Low fall risk   Focused Assessments    R Recommendations: See Admitting Provider Note  Report given to:   Additional Notes:

## 2018-10-19 NOTE — Progress Notes (Signed)
PROGRESS NOTE  Crystal Ashley GXQ:[id number] DOB: [birth year] DOA: 10/18/2018 PCP: Briscoe Deutscher, DO   LOS: 0 days   Brief Narrative / Interim history: 73 year old female with history of hypertension, hyperlipidemia, diabetes mellitus, A. fib on chronic anticoagulation with Xarelto, stage I uterine cancer with history of hysterectomy, chronic lower extremity edema on Lasix, hypothyroidism, who came to the hospital on 3/3 with shortness of breath for the last few days, progressively worsening.  She was found to be hypoxic to 79% on room air requiring supplemental oxygen.  She did have a leukocytosis.  Initial chest x-ray was concerning for left lower lobe pneumonia as well as mild cardiomegaly.  She was placed on antibiotics.  Subjective: She is feeling slightly better this morning, still some shortness of breath.  Still has oxygen on.  She denies any chest pain, no abdominal pain, no nausea or vomiting.  Assessment & Plan: Principal Problem:   Lobar pneumonia ([place]) Active Problems:   Hypothyroidism   Hyperlipidemia associated with type 2 diabetes mellitus ([place])   Type 2 diabetes mellitus without complication, without long-term current use of insulin ([facility])   Atrial fibrillation with RVR ([facility])   Anemia   Sepsis ([place])   Acute respiratory failure with hypoxia ([facility])   Acute bronchitis   HTN (hypertension)   Principal Problem Sepsis due to lobar pneumonia, acute hypoxic respiratory failure -Remains hypoxic, continue supplemental oxygen, continue IV antibiotics.  She reports gradual improvement since yesterday. -Cultures obtained, no growth to date, leukocytosis improving -There is also concern for CHF due to lower extremity swelling as well as hypoxia and a 2D echo is pending.  However, her lower extremity swelling seems to be chronic.  Her BNP were slightly elevated.  Stop IV fluids this morning  Active Problems A. Fib with RVR -Intermittent RVR overnight with rates as high as 130s,  better controlled this morning.  Have discussed with Dr. Einar Gip her primary cardiologist, he will see patient in consultation -Continue Xarelto, continue rate control with metoprolol  Hypothyroidism -Continue Synthroid  Hyperlipidemia -Continue atorvastatin  Type 2 diabetes mellitus -Last A1c 6.6, controlled.  Hold metformin continue sliding scale  Anemia of chronic disease -Stable, no bleeding  Hypertension -Continue home metoprolol, Lasix, Diovan and Norvasc were held in the setting of sepsis.  Given chronic lower extremity swelling I wonder whether her amlodipine should be discontinued -Continue to hold these medications  Scheduled Meds: . atorvastatin  10 mg Oral q1800  . cholecalciferol  5,000 Units Oral QODAY  . dorzolamide-timolol  1 drop Both Eyes Daily  . insulin aspart  0-5 Units Subcutaneous QHS  . insulin aspart  0-9 Units Subcutaneous TID WC  . ipratropium  0.5 mg Nebulization Q6H  . latanoprost  1 drop Both Eyes QHS  . levalbuterol  1.25 mg Nebulization Q6H  . levothyroxine  75 mcg Oral Daily  . mouth rinse  15 mL Mouth Rinse BID  . metoprolol succinate  100 mg Oral Daily  . [START ON 10/20/2018] predniSONE  20 mg Oral Q breakfast  . rivaroxaban  20 mg Oral Q supper   Continuous Infusions: . azithromycin    . cefTRIAXone (ROCEPHIN)  IV Stopped (10/19/18 0311)   PRN Meds:.acetaminophen, dextromethorphan-guaiFENesin, hydrALAZINE, hydrOXYzine  DVT prophylaxis: Xarelto Code Status: Full code Family Communication: son at bedside  Disposition Plan: home when ready   Consultants:   Cardiology   Procedures:   2D echo: pending  Antimicrobials:  Ceftriaxone 3/3-  Azithromycin 3/3-  Objective: Vitals:   10/19/18  0141 10/19/18 0149 10/19/18 0513 10/19/18 1011  BP:  125/76 121/75 (!) 142/87  Pulse:  87 (!) 101 85  Resp:  (!) 24 20   Temp:  98.6 F (37 C) 97.7 F (36.5 C)   TempSrc:  Oral Oral   SpO2:  93% 97%   Weight: 94.8 kg     Height: 5' (1.524  m)       Intake/Output Summary (Last 24 hours) at 10/19/2018 1127 Last data filed at 10/19/2018 0800 Gross per 24 hour  Intake 825.51 ml  Output 75 ml  Net 750.51 ml   Filed Weights   10/18/18 1945 10/19/18 0141  Weight: 95.3 kg 94.8 kg    Examination:  Constitutional: NAD Eyes: PERRL, lids and conjunctivae normal ENMT: Mucous membranes are moist.  Respiratory: clear to auscultation bilaterally, no wheezing, no crackles.  Increased respiratory effort.  Tachypneic Cardiovascular: Regular rate and rhythm, no murmurs / rubs / gallops.  1-2+ LE edema. 2+ pedal pulses. No carotid bruits.  Abdomen: no tenderness. Bowel sounds positive.  Musculoskeletal: no clubbing / cyanosis Skin: no rashes, lesions, ulcers. No induration Neurologic: CN 2-12 grossly intact. Strength 5/5 in all 4.  Psychiatric: Normal judgment and insight. Alert and oriented x 3. Normal mood.    Data Reviewed: I have independently reviewed following labs and imaging studies   CBC: Recent Labs  Lab 10/18/18 2058 10/19/18 0950  WBC 15.4* 11.9*  NEUTROABS 12.8*  --   HGB 10.4* 12.2  HCT 34.8* 40.3  MCV 96.1 94.8  PLT 319 010   Basic Metabolic Panel: Recent Labs  Lab 10/18/18 2058 10/19/18 0950  NA 136 138  K 4.5 4.4  CL 104 104  CO2 24 24  GLUCOSE 249* 206*  BUN 15 16  CREATININE 0.94 0.96  CALCIUM 8.6* 8.9   GFR: Estimated Creatinine Clearance: 54.5 mL/min (by C-G formula based on SCr of 0.96 mg/dL). Liver Function Tests: Recent Labs  Lab 10/18/18 2058  AST 25  ALT 20  ALKPHOS 95  BILITOT 0.7  PROT 7.0  ALBUMIN 3.6   No results for input(s): LIPASE, AMYLASE in the last 168 hours. No results for input(s): AMMONIA in the last 168 hours. Coagulation Profile: No results for input(s): INR, PROTIME in the last 168 hours. Cardiac Enzymes: Recent Labs  Lab 10/18/18 2058  TROPONINI <0.03   BNP (last 3 results) No results for input(s): PROBNP in the last 8760 hours. HbA1C: No results for  input(s): HGBA1C in the last 72 hours. CBG: Recent Labs  Lab 10/19/18 0800  GLUCAP 165*   Lipid Profile: No results for input(s): CHOL, HDL, LDLCALC, TRIG, CHOLHDL, LDLDIRECT in the last 72 hours. Thyroid Function Tests: No results for input(s): TSH, T4TOTAL, FREET4, T3FREE, THYROIDAB in the last 72 hours. Anemia Panel: No results for input(s): VITAMINB12, FOLATE, FERRITIN, TIBC, IRON, RETICCTPCT in the last 72 hours. Urine analysis:    Component Value Date/Time   COLORURINE YELLOW 10/18/2018 2259   APPEARANCEUR CLEAR 10/18/2018 2259   LABSPEC 1.016 10/18/2018 2259   PHURINE 5.0 10/18/2018 2259   GLUCOSEU >=500 (A) 10/18/2018 2259   HGBUR SMALL (A) 10/18/2018 2259   BILIRUBINUR NEGATIVE 10/18/2018 2259   [place] 10/18/2018 2259   PROTEINUR NEGATIVE 10/18/2018 2259   NITRITE NEGATIVE 10/18/2018 2259   LEUKOCYTESUR NEGATIVE 10/18/2018 2259   Sepsis Labs: Invalid input(s): PROCALCITONIN, LACTICIDVEN  No results found for this or any previous visit (from the past 240 hour(s)).    Radiology Studies: Dg Chest 2  View  Result Date: 10/18/2018 CLINICAL DATA:  Shortness of breath since this morning. Wheezing. EXAM: CHEST - 2 VIEW COMPARISON:  None. FINDINGS: Mildly enlarged cardiac silhouette. Small amount of increased density at the posterior costophrenic angles on the lateral view, possibly in the medial aspect of the left lower lobe on the frontal view. Otherwise, clear lungs. Thoracic and upper lumbar spine degenerative changes. IMPRESSION: 1. Probable mild left lower lobe atelectasis or pneumonia. 2. Mild cardiomegaly. Electronically Signed   By: Claudie Revering M.D.   On: 10/18/2018 20:27    Marzetta Board, MD, PhD [facility]  Contact via  [email or web address]  [place] P: [phone]  F: [phone]

## 2018-10-19 NOTE — Consult Note (Addendum)
CARDIOLOGY CONSULT NOTE  Patient ID: Crystal Ashley MRN: [id number] DOB/AGE: 73-07-1946 73 y.o.  Admit date: 10/18/2018 Referring Physician  Laverna Peace, MD Primary Physician:  Briscoe Deutscher, DO Reason for Consultation  A. Fib and CHF  HPI: Crystal Ashley  is a 73 y.o. female   Crystal Ashley is an [place] female with type 2 diabetes, hyperlipidemia, hypertension, hypothyroidism, history of endometrial cancer in 2015, h/o cardioversion on 04/08/2018 with improvement in fatigue and dyspnea, admitted with pneumonia and was found to be hypoxemic and also in A. Fib. I was consulted to manage A. Fib. She is presently on Xarelto and is tolerating this well.  States since being in the hospital, dyspnea has improved and she has not had chills or cough. Her son is present.  Past Medical History:  Diagnosis Date  . Cancer ([place])   . Cataract   . Glaucoma   . Hypertension   . Thyroid disease      Past Surgical History:  Procedure Laterality Date  . CARDIOVERSION N/A 04/08/2018   Procedure: CARDIOVERSION;  Surgeon: Adrian Prows, MD;  Location: [facility];  Service: Cardiovascular;  Laterality: N/A;  . FOOT SURGERY Left   . HYSTEROSCOPY     POLYPECTOMY     Family History  Problem Relation Age of Onset  . Diabetes Son      Social History: Social History   Socioeconomic History  . Marital status: Married    Spouse name: Not on file  . Number of children: Not on file  . Years of education: Not on file  . Highest education level: Not on file  Occupational History  . Not on file  Social Needs  . Financial resource strain: Not on file  . Food insecurity:    Worry: Not on file    Inability: Not on file  . Transportation needs:    Medical: Not on file    Non-medical: Not on file  Tobacco Use  . Smoking status: Never Smoker  . Smokeless tobacco: Never Used  Substance and Sexual Activity  . Alcohol use: No  . Drug use: Not on file  . Sexual activity: Yes   Lifestyle  . Physical activity:    Days per week: Not on file    Minutes per session: Not on file  . Stress: Not on file  Relationships  . Social connections:    Talks on phone: Not on file    Gets together: Not on file    Attends religious service: Not on file    Active member of club or organization: Not on file    Attends meetings of clubs or organizations: Not on file    Relationship status: Not on file  . Intimate partner violence:    Fear of current or ex partner: Not on file    Emotionally abused: Not on file    Physically abused: Not on file    Forced sexual activity: Not on file  Other Topics Concern  . Not on file  Social History Narrative  . Not on file     Medications Prior to Admission  Medication Sig Dispense Refill Last Dose  . amLODipine (NORVASC) 10 MG tablet Take 1 tablet (10 mg total) by mouth daily. 90 tablet 2 10/18/2018 at Unknown time  . atorvastatin (LIPITOR) 10 MG tablet TAKE 1 TABLET EVERY DAY 90 tablet 3 10/18/2018 at Unknown time  . cholecalciferol (VITAMIN D) 1000 UNITS tablet Take 5,000 Units by mouth every other day.  10/18/2018 at Unknown time  . cyanocobalamin (,VITAMIN B-12,) 1000 MCG/ML injection 1000 mcg (1 mg) injection once per week for four weeks, followed by 1000 mcg injection once per month. 30 mL 3 10/17/2018 at Unknown time  . dorzolamide-timolol (COSOPT) 22.3-6.8 MG/ML ophthalmic solution Place 1 drop into both eyes daily.    10/18/2018 at Unknown time  . furosemide (LASIX) 40 MG tablet TAKE 1 TABLET EVERY DAY 90 tablet 1 10/18/2018 at Unknown time  . levothyroxine (SYNTHROID, LEVOTHROID) 75 MCG tablet TAKE 1 TABLET (75 MCG TOTAL) BY MOUTH DAILY. 90 tablet 3 10/18/2018 at Unknown time  . metFORMIN (GLUCOPHAGE) 1000 MG tablet Take 1 tablet (1,000 mg total) by mouth 2 (two) times daily with a meal. (Patient taking differently: Take 1,000 mg by mouth daily with breakfast. ) 180 tablet 3 10/18/2018 at Unknown time  . metoprolol succinate (TOPROL-XL) 100  MG 24 hr tablet Take 1 tablet (100 mg total) by mouth daily. Take with or immediately following a meal. 90 tablet 3 10/18/2018 at 600 am  . travoprost, benzalkonium, (TRAVATAN) 0.004 % ophthalmic solution Place 1 drop into both eyes at bedtime.    10/17/2018 at Unknown time  . valsartan (DIOVAN) 320 MG tablet Take 1 tablet (320 mg total) by mouth daily. 90 tablet 3 10/18/2018 at Unknown time  . XARELTO 20 MG TABS tablet TAKE 1 TABLET (20 MG TOTAL) BY MOUTH DAILY WITH SUPPER. (Patient taking differently: Take 20 mg by mouth daily with supper. High risk med: Anticoagulant.  Crushed Xarelto can be given down a G-tube but NOT a J-Tube.) 90 tablet 1 10/18/2018 at 600 am  . amLODipine-valsartan (EXFORGE) 10-320 MG tablet Take 1 tablet by mouth every morning. (Patient not taking: Reported on 10/18/2018) 90 tablet 3 Not Taking at Unknown time  . Syringe/Needle, Disp, (SYRINGE 3CC/25GX1") 25G X 1" 3 ML MISC Use one a week for 4 weeks then once a month after. 12 each 1     Review of Systems  Constitutional: Positive for malaise/fatigue. Negative for weight loss.  Respiratory: Positive for shortness of breath and wheezing. Negative for cough and hemoptysis.   Cardiovascular: Positive for leg swelling. Negative for chest pain, palpitations and claudication.  Gastrointestinal: Negative for abdominal pain, blood in stool, constipation, heartburn and vomiting.  Genitourinary: Negative for dysuria.  Musculoskeletal: Negative for joint pain and myalgias.  Neurological: Negative for dizziness, focal weakness and headaches.  Endo/Heme/Allergies: Does not bruise/bleed easily.  Psychiatric/Behavioral: Negative for depression. The patient is nervous/anxious.   All other systems reviewed and are negative.   Physical Exam: Blood pressure 129/62, pulse 72, temperature 98.4 F (36.9 C), temperature source Oral, resp. rate 16, height 5' (1.524 m), weight 94.8 kg, SpO2 94 %.  Physical Exam  Constitutional: She appears  well-developed. No distress.  HENT:  Head: Atraumatic.  Eyes: Conjunctivae are normal.  Neck: Neck supple. No JVD (difficult to make out due to short neck) present. No thyromegaly present.  Cardiovascular: Normal heart sounds. An irregular rhythm present. Exam reveals no gallop and no S3.  No murmur heard. Pulses:      Carotid pulses are 2+ on the right side and 2+ on the left side.      Dorsalis pedis pulses are 2+ on the right side and 2+ on the left side.       Posterior tibial pulses are 2+ on the right side and 2+ on the left side.  Femoral and popliteal pulse difficult to feel due to patient's body habitus.  Pulmonary/Chest: Effort normal. She has rales (bilateral bases, left > right).  Abdominal: Soft. Bowel sounds are normal.  obese  Musculoskeletal: Normal range of motion.        General: No edema.  Neurological: She is alert.  Skin: Skin is warm and dry.  Psychiatric: She has a normal mood and affect.   Labs:  BNP (last 3 results) Recent Labs    10/18/18 2058  BNP 397.1*    CMP Latest Ref Rng & Units 10/19/2018 10/18/2018 09/20/2018  Glucose 70 - 99 mg/dL 206(H) 249(H) 149(H)  BUN 8 - 23 mg/dL 16 15 19   Creatinine 0.44 - 1.00 mg/dL 0.96 0.94 0.90  Sodium 135 - 145 mmol/L 138 136 139  Potassium 3.5 - 5.1 mmol/L 4.4 4.5 4.2  Chloride 98 - 111 mmol/L 104 104 100  CO2 22 - 32 mmol/L 24 24 28   Calcium 8.9 - 10.3 mg/dL 8.9 8.6(L) 9.3  Total Protein 6.5 - 8.1 g/dL - 7.0 7.0  Total Bilirubin 0.3 - 1.2 mg/dL - 0.7 0.4  Alkaline Phos 38 - 126 U/L - 95 92  AST 15 - 41 U/L - 25 16  ALT 0 - 44 U/L - 20 15     CBC Latest Ref Rng & Units 10/19/2018 10/18/2018 09/20/2018  WBC 4.0 - 10.5 K/uL 11.9(H) 15.4(H) 8.4  Hemoglobin 12.0 - 15.0 g/dL 12.2 10.4(L) 11.6(L)  Hematocrit 36.0 - 46.0 % 40.3 34.8(L) 35.3(L)  Platelets 150 - 400 K/uL 285 319 312.0      Lipid Panel     Component Value Date/Time   CHOL 133 03/15/2018 1317   TRIG 183.0 (H) 03/15/2018 1317   HDL 42.20 03/15/2018  1317   CHOLHDL 3 03/15/2018 1317   VLDL 36.6 03/15/2018 1317   LDLCALC 54 03/15/2018 1317     BNP (last 3 results) Recent Labs    10/18/18 2058  BNP 397.1*     HEMOGLOBIN A1C Lab Results  Component Value Date   HGBA1C 6.6 (H) 09/20/2018    BMP Latest Ref Rng & Units 10/19/2018 10/18/2018 09/20/2018  Glucose 70 - 99 mg/dL 206(H) 249(H) 149(H)  BUN 8 - 23 mg/dL 16 15 19   Creatinine 0.44 - 1.00 mg/dL 0.96 0.94 0.90  Sodium 135 - 145 mmol/L 138 136 139  Potassium 3.5 - 5.1 mmol/L 4.4 4.5 4.2  Chloride 98 - 111 mmol/L 104 104 100  CO2 22 - 32 mmol/L 24 24 28   Calcium 8.9 - 10.3 mg/dL 8.9 8.6(L) 9.3     TSH Recent Labs    03/15/18 1317  TSH 1.63     Radiology: Dg Chest 2 View  Result Date: 10/18/2018 CLINICAL DATA:  Shortness of breath since this morning. Wheezing. EXAM: CHEST - 2 VIEW COMPARISON:  None. FINDINGS: Mildly enlarged cardiac silhouette. Small amount of increased density at the posterior costophrenic angles on the lateral view, possibly in the medial aspect of the left lower lobe on the frontal view. Otherwise, clear lungs. Thoracic and upper lumbar spine degenerative changes. IMPRESSION: 1. Probable mild left lower lobe atelectasis or pneumonia. 2. Mild cardiomegaly. Electronically Signed   By: Claudie Revering M.D.   On: 10/18/2018 20:27    Scheduled Meds: . atorvastatin  10 mg Oral q1800  . cholecalciferol  5,000 Units Oral QODAY  . dorzolamide-timolol  1 drop Both Eyes Daily  . insulin aspart  0-5 Units Subcutaneous QHS  . insulin aspart  0-9 Units Subcutaneous TID WC  . ipratropium  0.5 mg Nebulization Q6H  .  latanoprost  1 drop Both Eyes QHS  . levalbuterol  1.25 mg Nebulization Q6H  . levothyroxine  75 mcg Oral Daily  . mouth rinse  15 mL Mouth Rinse BID  . metoprolol succinate  100 mg Oral Daily  . [START ON 10/20/2018] predniSONE  20 mg Oral Q breakfast  . rivaroxaban  20 mg Oral Q supper   Continuous Infusions: . azithromycin    . cefTRIAXone  (ROCEPHIN)  IV Stopped (10/19/18 0311)   PRN Meds:.acetaminophen, dextromethorphan-guaiFENesin, hydrALAZINE, hydrOXYzine  CARDIAC STUDIES:  EKG: 10/19/2018: Atrial fibrillation with RVR, low voltage complexes, poor R-wave progression, nonspecific T abnormality.  Lexiscan myoview stress test 04/01/2018: 1. Lexiscan stress test was performed. Exercise capacity was not assessed. No stress symptoms reported. Normal blood pressure. The resting and stress electrocardiogram demonstrated atrial fibrillation with rapid ventricular rate, low voltage, and normal rest repolarization. Stress EKG is non diagnostic for ischemia as it is a pharmacologic stress. 2. The overall quality of the study is fair. Review of the raw data in a rotational cine format reveals breast attenuation, with imaging performed in sitting position. Gated SPECT images reveal normal myocardial thickening and wall motion. The left ventricular ejection fraction was calculated or visually estimated to be 47%. REST and STRESS images demonstrate decreased tracer uptake in the mid inferoseptal, mid inferior, apical septal and apical inferior segments of the left ventricle, worse on rest images. While defect likely represents breast attenuation, ischemia in this region cannot be excluded. Clinical correlation recommended. 3. Low to intermediate risk study.  Recent Results (from the past 43800 hour(s))  ECHOCARDIOGRAM COMPLETE   Collection Time: 10/19/18 10:47 AM  Result Value   Weight 3,343.94   Height 60   BP 142/87   Narrative     ECHOCARDIOGRAM REPORT       Patient Name:   Crystal Ashley Date of Exam: 10/19/2018 Medical Rec #:  [id number]          Height:       60.0 in Accession #:    [id number]         Weight:       209.0 lb Date of Birth:  04-07-1946           BSA:          1.90 m Patient Age:    50 years           BP:           121/75 mmHg Patient Gender: F                  HR:           101 bpm. Exam Location:   Inpatient    Procedure: 2D Echo, Cardiac Doppler and Color Doppler  Indications:     Lower Extremity Edema   History:         Patient has no prior history of Echocardiogram examinations.                  Sepsis, Acute Respritory Failure with Hypoxia, Diabetes, Atrial                  Fibrillation, Pneumonia, Bronchitis, Hypothyroid,                  Hyperlipidemia, Hypertension, Hypoxia, COPD, Atrial                  Fibrillation with Rapid Ventiricular Rate, Morbid Obesity.   Sonographer:  Madelaine Etienne RDCS (AE) Referring Phys:  [place] Diagnosing Phys: Adrian Prows MD  IMPRESSIONS    1. The left ventricle has normal systolic function, with an ejection fraction of 55-60%. The cavity size was normal. Left ventricular diastolic Doppler parameters are indeterminate No evidence of left ventricular regional wall motion abnormalities.  2. Left atrial size was moderately dilated.  3. The mitral valve is degenerative. Mild thickening of the mitral valve leaflet. Mild calcification of the mitral valve leaflet.  4. The tricuspid valve is normal in structure.  5. The aortic valve is tricuspid Mild calcification of the aortic valve.  6. Pulmonary hypertension is mild. PA pressure 40 mm Hg (CVP 15)  FINDINGS  Left Ventricle: The left ventricle has normal systolic function, with an ejection fraction of 55-60%. The cavity size was normal. There is no increase in left ventricular wall thickness. Left ventricular diastolic Doppler parameters are indeterminate No  evidence of left ventricular regional wall motion abnormalities.. Right Ventricle: The right ventricle has normal systolic function. The cavity was normal. There is no increase in right ventricular wall thickness. Left Atrium: left atrial size was moderately dilated Right Atrium: right atrial size was mild-moderately dilated. Right atrial pressure is estimated at 10 mmHg. Dilated hepatic veins are noted, suggestive of elevated right  atrial pressures. Dilated hepatic veins are noted, suggestive of elevated right  atrial pressures. Interatrial Septum: No atrial level shunt detected by color flow Doppler. Pericardium: There is no evidence of pericardial effusion. Mitral Valve: The mitral valve is degenerative in appearance. Mild thickening of the mitral valve leaflet. Mild calcification of the mitral valve leaflet. Mitral valve regurgitation is mild by color flow Doppler. Tricuspid Valve: The tricuspid valve is normal in structure. Tricuspid valve regurgitation is mild by color flow Doppler. Aortic Valve: The aortic valve is tricuspid Mild calcification of the aortic valve. Aortic valve regurgitation was not visualized by color flow Doppler. Pulmonic Valve: The pulmonic valve was grossly normal. Pulmonic valve regurgitation is trivial by color flow Doppler. Aorta: The aortic root is normal in size and structure. Pulmonary Artery: Pulmonary hypertension is mild.   LEFT VENTRICLE PLAX 2D (Teich)              Biplane EF (MOD) LV EF:          67.4 %       LV Biplane EF:   68.0 % LVIDd:          4.08 cm      LV A4C EF:       61.8 % LVIDs:          2.57 cm      LV A2C EF:       72.6 % LV PW:          0.68 cm LV IVS:         0.84 cm      Diastology LVOT diam:      1.60 cm      LV e' lateral:   7.94 cm/s LV SV:          49 ml        LV E/e' lateral: 16.2 LVOT Area:      2.01 cm     LV e' medial:    8.81 cm/s                              LV E/e' medial:  14.6 LV  Volumes (MOD) LV area d, A2C:    19.20 cm LV area d, A4C:    22.70 cm LV area s, A2C:    8.85 cm LV area s, A4C:    12.60 cm LV major d, A2C:   6.69 cm LV major d, A4C:   6.59 cm LV major s, A2C:   5.27 cm LV major s, A4C:   5.36 cm LV vol d, MOD A2C: 46.7 ml LV vol d, MOD A4C: 64.6 ml LV vol s, MOD A2C: 12.8 ml LV vol s, MOD A4C: 24.7 ml LV SV MOD A2C:     33.9 ml LV SV MOD A4C:     64.6 ml LV SV MOD BP:      37.5 ml  RIGHT VENTRICLE RV Basal diam:   3.07 cm RV Mid diam:    3.52 cm RV S prime:     9.48 cm/s TAPSE (M-mode): 1.1 cm RVSP:           32.4 mmHg  LEFT ATRIUM             Index       RIGHT ATRIUM           Index LA diam:        2.80 cm 1.47 cm/m  RA Pressure: 10 mmHg LA Vol (A2C):   43.0 ml 22.61 ml/m RA Area:     16.10 cm LA Vol (A4C):   44.9 ml 23.61 ml/m RA Volume:   40.00 ml  21.03 ml/m LA Biplane Vol: 44.0 ml 23.14 ml/m  AORTIC VALVE              PULMONIC VALVE LVOT Vmax:   146.00 cm/s  PV Vmax:       1.24 m/s LVOT Vmean:  106.000 cm/s PV Peak grad:  6.2 mmHg LVOT VTI:    0.252 m   AORTA Ao Root diam: 2.90 cm Ao Asc diam:  3.30 cm  MITRAL VALVE               TRICUSPID VALVE MV Area (PHT): 6.54 cm    TR Peak grad:   22.4 mmHg MV PHT:        33.64 msec  TR Vmax:        249.00 cm/s MV Decel Time: 116 msec    RVSP:           32.4 mmHg MV E velocity: 128.50 cm/s    Adrian Prows MD Electronically signed by Adrian Prows MD Signature Date/Time: 10/19/2018/12:57:09 PM       Final     *Note: Due to a large number of results and/or encounters for the requested time period, some results have not been displayed. A complete set of results can be found in Results Review.     ASSESSMENT AND PLAN:  1.  Atrial fibrillation with RVR, paroxysmal atrial fibrillation.  Had cardioversion on 04/05/2018 and was in sinus rhythm in September 2019.  Not sure if she has symptomatic atrial fibrillation.  May have been precipitated by underlying pneumonia. CHA2DS2-VASc Score is 3.  -(CHF; HTN; vasc disease DM,  Female = 1; Age <65 =0; 65-74 = 1,  >75 =2; stroke = 2).  -(Yearly risk of stroke: Score of 1=1.3; 2=2.2; 3=3.2; 4=4; 5=6.7; 6=9.8; 7=>9.8)  2.  Acute on chronic diastolic heart failure 3.  Diabetes mellitus type II controlled without complications without hyperglycemia. 4.  Chronic bilateral lower extremity edema  Recommendation: With regard to atrial flutter ablation, she  is presently on anticoagulation Xarelto continue the  same.  Once her pulmonary issues are resolved in 3-4 weeks,  could consider repeat cardioversion If symptomatic. Presents in acute diastolic heart failure precipitated by pneumonia and also atrial fibrillation with RVR.  Rate is getting better control now, her respiratory status is also improved.  If significant leg edema is still present tomorrow and blood pressure is stable, she may receive 40 mg of IV Lasix tomorrow.  Otherwise continue present medical therapy, gentle diuresis with Maxzide 37.5/25 mg every morning will be started and  consideration for Aldactone 25-50 mg daily  on a chronic basis if edema not improved  In 1-2 days or prior to discharge.  Addendum: 10/20/2018: I have added 4milligrams Lasix b.i.d., 2 doses, mag sulfate 2 g, potassium supplements 40 mEq for today.  I'll start her on Aldactone 25 mg daily starting tomorrow.   Adrian Prows, MD, [facility] 10/19/2018, 9:22 PM [facility] [place] Pager: [phone] Office: [phone] If no answer Cell [phone]

## 2018-10-19 NOTE — Progress Notes (Addendum)
CRITICAL VALUE ALERT  Critical Value:  Lactic acid 2.4  Date & Time Notied:  10/19/18 0310  Provider Notified: Schorr, NP  Orders Received/Actions taken: 500 mL bolus ordered.   Unable to run 500 mL bolus through pt's 24G IV d/t small catheter size. IV team paged to place a larger bore IV in order to run bolus. NP informed.  Repeat lactic 1.6. NP notified and bolus d/c'd. IV team still to place a larger IV for later use.

## 2018-10-20 LAB — GLUCOSE, CAPILLARY
GLUCOSE-CAPILLARY: 171 mg/dL — AB (ref 70–99)
Glucose-Capillary: 144 mg/dL — ABNORMAL HIGH (ref 70–99)
Glucose-Capillary: 191 mg/dL — ABNORMAL HIGH (ref 70–99)
Glucose-Capillary: 204 mg/dL — ABNORMAL HIGH (ref 70–99)

## 2018-10-20 LAB — LEGIONELLA PNEUMOPHILA SEROGP 1 UR AG: L. PNEUMOPHILA SEROGP 1 UR AG: NEGATIVE

## 2018-10-20 MED ORDER — AZITHROMYCIN 250 MG PO TABS
500.0000 mg | ORAL_TABLET | Freq: Every day | ORAL | Status: DC
  Administered 2018-10-20: 500 mg via ORAL
  Filled 2018-10-20: qty 2

## 2018-10-20 MED ORDER — MAGNESIUM SULFATE 2 GM/50ML IV SOLN
2.0000 g | Freq: Once | INTRAVENOUS | Status: AC
  Administered 2018-10-20: 2 g via INTRAVENOUS
  Filled 2018-10-20: qty 50

## 2018-10-20 MED ORDER — FUROSEMIDE 10 MG/ML IJ SOLN
40.0000 mg | Freq: Two times a day (BID) | INTRAMUSCULAR | Status: AC
  Administered 2018-10-20 (×2): 40 mg via INTRAVENOUS
  Filled 2018-10-20 (×2): qty 4

## 2018-10-20 MED ORDER — SPIRONOLACTONE 25 MG PO TABS
25.0000 mg | ORAL_TABLET | Freq: Every day | ORAL | Status: DC
  Administered 2018-10-21: 25 mg via ORAL
  Filled 2018-10-20: qty 1

## 2018-10-20 MED ORDER — CEFDINIR 300 MG PO CAPS
300.0000 mg | ORAL_CAPSULE | Freq: Two times a day (BID) | ORAL | Status: DC
  Administered 2018-10-20 – 2018-10-21 (×2): 300 mg via ORAL
  Filled 2018-10-20 (×2): qty 1

## 2018-10-20 MED ORDER — POTASSIUM CHLORIDE CRYS ER 20 MEQ PO TBCR
20.0000 meq | EXTENDED_RELEASE_TABLET | Freq: Two times a day (BID) | ORAL | Status: AC
  Administered 2018-10-20 (×2): 20 meq via ORAL
  Filled 2018-10-20 (×2): qty 1

## 2018-10-20 NOTE — Progress Notes (Signed)
PROGRESS NOTE  Crystal Ashley LEX:[id number] DOB: Dec 27, 1945 DOA: 10/18/2018 PCP: Briscoe Deutscher, DO   LOS: 1 day   Brief Narrative / Interim history: 73 year old female with history of hypertension, hyperlipidemia, diabetes mellitus, A. fib on chronic anticoagulation with Xarelto, stage I uterine cancer with history of hysterectomy, chronic lower extremity edema on Lasix, hypothyroidism, who came to the hospital on 3/3 with shortness of breath for the last few days, progressively worsening.  She was found to be hypoxic to 79% on room air requiring supplemental oxygen.  She did have a leukocytosis.  Initial chest x-ray was concerning for left lower lobe pneumonia as well as mild cardiomegaly.  She was placed on antibiotics.  Subjective: Feeling better, shortness of breath is improving.  Assessment & Plan: Principal Problem:   Lobar pneumonia ([place]) Active Problems:   Hypothyroidism   Hyperlipidemia associated with type 2 diabetes mellitus ([place])   Type 2 diabetes mellitus without complication, without long-term current use of insulin (HCC)   Atrial fibrillation with RVR (HCC)   Anemia   Sepsis ([place])   Acute respiratory failure with hypoxia (HCC)   Acute bronchitis   HTN (hypertension)   Acute on chronic combined systolic and diastolic CHF (congestive heart failure) (HCC)   Principal Problem Sepsis due to lobar pneumonia, acute hypoxic respiratory failure -Remains hypoxic, continue supplemental oxygen, continue IV antibiotics.  She reports gradual improvement since yesterday. -Cultures obtained, no growth to date, leukocytosis improving -There is also concern for CHF due to lower extremity swelling as well as hypoxia -placed on IV Lasix for today by cardiology  Active Problems A. Fib with RVR -Continue Xarelto, continue rate control with metoprolol -She did have cardioversion in the past but flipped back into A. fib  Hypothyroidism -Continue  Synthroid  Hyperlipidemia -Continue atorvastatin  Type 2 diabetes mellitus -Last A1c 6.6, controlled. Hold metformin continue sliding scale  Anemia of chronic disease -Stable, no bleeding  Hypertension -Continue metoprolol, placed on IV Lasix.  Scheduled Meds: . atorvastatin  10 mg Oral q1800  . azithromycin  500 mg Oral QHS  . cefdinir  300 mg Oral Q12H  . cholecalciferol  5,000 Units Oral QODAY  . dorzolamide-timolol  1 drop Both Eyes Daily  . furosemide  40 mg Intravenous BID  . insulin aspart  0-5 Units Subcutaneous QHS  . insulin aspart  0-9 Units Subcutaneous TID WC  . ipratropium  0.5 mg Nebulization QID  . latanoprost  1 drop Both Eyes QHS  . levalbuterol  1.25 mg Nebulization QID  . levothyroxine  75 mcg Oral Daily  . mouth rinse  15 mL Mouth Rinse BID  . metoprolol succinate  100 mg Oral Daily  . potassium chloride SA  20 mEq Oral BID  . predniSONE  20 mg Oral Q breakfast  . rivaroxaban  20 mg Oral Q supper  . [START ON 10/21/2018] spironolactone  25 mg Oral Daily   Continuous Infusions:  PRN Meds:.acetaminophen, dextromethorphan-guaiFENesin, hydrALAZINE, hydrOXYzine, levalbuterol  DVT prophylaxis: Xarelto Code Status: Full code Family Communication: son at bedside  Disposition Plan: home when ready   Consultants:   Cardiology   Procedures:   2D echo: pending  Antimicrobials:  Ceftriaxone 3/3-  Azithromycin 3/3-  Objective: Vitals:   10/20/18 0614 10/20/18 0843 10/20/18 0845 10/20/18 0943  BP: 114/78 (!) 143/93    Pulse: (!) 53 84 (!) 50   Resp: 14     Temp: 97.6 F (36.4 C)     TempSrc: Oral  SpO2: 97% 94% 99% 96%  Weight:      Height:        Intake/Output Summary (Last 24 hours) at 10/20/2018 1115 Last data filed at 10/20/2018 0900 Gross per 24 hour  Intake 1180.43 ml  Output -  Net 1180.43 ml   Filed Weights   10/18/18 1945 10/19/18 0141  Weight: 95.3 kg 94.8 kg    Examination:  Constitutional: No distress, eating  breakfast Eyes: No scleral icterus ENMT: mmm Respiratory: Clear to auscultation bilaterally, no wheezing or crackles.  Increased respiratory effort Cardiovascular: Regular rate and rhythm, no murmurs appreciated.  1-2+ lower extremity edema Abdomen: Soft, nontender, nondistended, positive bowel sounds Musculoskeletal: no clubbing / cyanosis Skin: No rashes seen Neurologic: No focal deficits, equal strength   Data Reviewed: I have independently reviewed following labs and imaging studies   CBC: Recent Labs  Lab 10/18/18 2058 10/19/18 0950  WBC 15.4* 11.9*  NEUTROABS 12.8*  --   HGB 10.4* 12.2  HCT 34.8* 40.3  MCV 96.1 94.8  PLT 319 026   Basic Metabolic Panel: Recent Labs  Lab 10/18/18 2058 10/19/18 0950  NA 136 138  K 4.5 4.4  CL 104 104  CO2 24 24  GLUCOSE 249* 206*  BUN 15 16  CREATININE 0.94 0.96  CALCIUM 8.6* 8.9   GFR: Estimated Creatinine Clearance: 54.5 mL/min (by C-G formula based on SCr of 0.96 mg/dL). Liver Function Tests: Recent Labs  Lab 10/18/18 2058  AST 25  ALT 20  ALKPHOS 95  BILITOT 0.7  PROT 7.0  ALBUMIN 3.6   No results for input(s): LIPASE, AMYLASE in the last 168 hours. No results for input(s): AMMONIA in the last 168 hours. Coagulation Profile: No results for input(s): INR, PROTIME in the last 168 hours. Cardiac Enzymes: Recent Labs  Lab 10/18/18 2058  TROPONINI <0.03   BNP (last 3 results) No results for input(s): PROBNP in the last 8760 hours. HbA1C: No results for input(s): HGBA1C in the last 72 hours. CBG: Recent Labs  Lab 10/19/18 0800 10/19/18 1159 10/19/18 1604 10/19/18 2142 10/20/18 0816  GLUCAP 165* 225* 193* 202* 144*   Lipid Profile: No results for input(s): CHOL, HDL, LDLCALC, TRIG, CHOLHDL, LDLDIRECT in the last 72 hours. Thyroid Function Tests: No results for input(s): TSH, T4TOTAL, FREET4, T3FREE, THYROIDAB in the last 72 hours. Anemia Panel: No results for input(s): VITAMINB12, FOLATE, FERRITIN,  TIBC, IRON, RETICCTPCT in the last 72 hours. Urine analysis:    Component Value Date/Time   COLORURINE YELLOW 10/18/2018 2259   APPEARANCEUR CLEAR 10/18/2018 2259   LABSPEC 1.016 10/18/2018 2259   PHURINE 5.0 10/18/2018 2259   GLUCOSEU >=500 (A) 10/18/2018 2259   HGBUR SMALL (A) 10/18/2018 2259   BILIRUBINUR NEGATIVE 10/18/2018 2259   [place] 10/18/2018 2259   PROTEINUR NEGATIVE 10/18/2018 2259   NITRITE NEGATIVE 10/18/2018 2259   LEUKOCYTESUR NEGATIVE 10/18/2018 2259   Sepsis Labs: Invalid input(s): PROCALCITONIN, LACTICIDVEN  No results found for this or any previous visit (from the past 240 hour(s)).    Radiology Studies: Dg Chest 2 View  Result Date: 10/18/2018 CLINICAL DATA:  Shortness of breath since this morning. Wheezing. EXAM: CHEST - 2 VIEW COMPARISON:  None. FINDINGS: Mildly enlarged cardiac silhouette. Small amount of increased density at the posterior costophrenic angles on the lateral view, possibly in the medial aspect of the left lower lobe on the frontal view. Otherwise, clear lungs. Thoracic and upper lumbar spine degenerative changes. IMPRESSION: 1. Probable mild left lower lobe atelectasis  or pneumonia. 2. Mild cardiomegaly. Electronically Signed   By: Claudie Revering M.D.   On: 10/18/2018 20:27    Marzetta Board, MD, PhD [facility]  Contact via  [email or web address]  [place] P: [phone]  F: [phone]

## 2018-10-21 DIAGNOSIS — E785 Hyperlipidemia, unspecified: Secondary | ICD-10-CM

## 2018-10-21 DIAGNOSIS — E1169 Type 2 diabetes mellitus with other specified complication: Secondary | ICD-10-CM

## 2018-10-21 DIAGNOSIS — I10 Essential (primary) hypertension: Secondary | ICD-10-CM

## 2018-10-21 LAB — BASIC METABOLIC PANEL
Anion gap: 6 (ref 5–15)
BUN: 25 mg/dL — ABNORMAL HIGH (ref 8–23)
CALCIUM: 8.4 mg/dL — AB (ref 8.9–10.3)
CO2: 28 mmol/L (ref 22–32)
Chloride: 104 mmol/L (ref 98–111)
Creatinine, Ser: 0.97 mg/dL (ref 0.44–1.00)
GFR calc Af Amer: >60 mL/min (ref >60)
GFR calc non Af Amer: 58 mL/min — ABNORMAL LOW (ref >60)
Glucose, Bld: 124 mg/dL — ABNORMAL HIGH (ref 70–99)
Potassium: 4.5 mmol/L (ref 3.5–5.1)
Sodium: 138 mmol/L (ref 135–145)

## 2018-10-21 LAB — GLUCOSE, CAPILLARY: Glucose-Capillary: 112 mg/dL — ABNORMAL HIGH (ref 70–99)

## 2018-10-21 MED ORDER — AZITHROMYCIN 250 MG PO TABS: ORAL_TABLET | ORAL | 0 refills | Status: DC

## 2018-10-21 MED ORDER — SPIRONOLACTONE 25 MG PO TABS: 25.0000 mg | ORAL_TABLET | Freq: Every day | ORAL | 1 refills | Status: DC

## 2018-10-21 MED ORDER — CEFDINIR 300 MG PO CAPS: 300.0000 mg | ORAL_CAPSULE | Freq: Two times a day (BID) | ORAL | 0 refills | Status: DC

## 2018-10-21 NOTE — Discharge Summary (Signed)
Physician Discharge Summary  Crystal Ashley ZGY:[id number] DOB: 1946/06/24 DOA: 10/18/2018  PCP: Briscoe Deutscher, DO  Admit date: 10/18/2018 Discharge date: 10/21/2018  Admitted From: Home Disposition: Home  Recommendations for Outpatient Follow-up:  1. Follow up with PCP in 1-2 weeks 2. Follow-up with Dr. Einar Gip in 1 to 2 weeks  Home Health: None Equipment/Devices: None  Discharge Condition: Stable CODE STATUS: Full code Diet recommendation: Low-sodium, heart healthy  HPI: Per admitting MD, Crystal Ashley is a 73 y.o. female with medical history significant of hypertension, hyperlipidemia, diabetes mellitus, atrial fibrillation on Xarelto, stage I uterine cancer (s/p of hysterectomy), bilateral leg edema on Lasix, hypothyroidism, who presents with shortness of breath. Patient states that her shortness of breath started yesterday morning, which has been progressively worsening.  Denies cough, chest pain, fever or chills.  Patient was found to have oxygen desaturation to 79% on room air, which improved to 98% on 4 L nasal cannula oxygen.  Patient denies nausea, vomiting, diarrhea, abdominal pain, symptoms of UTI or unilateral weakness.  Patient states that she is taking Lasix for leg edema, does not know if she has history of CHF. ED Course: pt was found to have WBC 15.4, BNP 397.1, negative troponin, negative d-dimer, negative flu PCR, electrolytes renal function okay, VBG with pH 7.344, CO2 46.4, O2 54.6, temperature normal, tachycardia, tachypnea, chest x-ray showed mild cardiomegaly, possible left lower lobe infiltration.  Patient is admitted to telemetry bed as inpatient.  Hospital Course: Principal Problem  Sepsis due to lobar pneumonia, acute hypoxic respiratory failure, acute on chronic diastolic CHF-patient was admitted to the hospital with hypoxia and felt to be septic due to lobar pneumonia.  She was placed on IV antibiotics with ceftriaxone and azithromycin.  She was also felt  to have mild fluid overload given cardiomegaly on chest x-ray as well as lower extremity swelling.  Cardiology was consulted and followed patient while hospitalized.  He was also given IV diuretics and later transitioned to orals.  Underwent a repeat 2D echo which showed normal EF 55-60%, full read below.  She gradually improved, she was able to be weaned off to room air, her antibiotics were converted to oral agents with cefdinir and azithromycin.  She remained stable, discussed with Dr. Einar Gip with cardiology on the day of discharge, he would like patient to be discharged on furosemide as well as spironolactone.  Given some degree of chronic lower extremity swelling her amlodipine has been discontinued on discharge.  Active Problems A. Fib with RVR -initially poorly rate controlled likely in the setting of pneumonia and mild fluid overload, rate improved continue rate control with metoprolol, she did have a cardioversion in the past but flipped back into A. fib.  She is on Xarelto for anticoagulation.  Dr. Einar Gip to follow-up further as an outpatient and decide further course and whether repeat cardioversion would be indicated. Hypothyroidism -Continue Synthroid Hyperlipidemia -Continue atorvastatin Type 2 diabetes mellitus -Last A1c 6.6, controlled.  Resume home medications Anemia of chronic disease -Stable, no bleeding Hypertension -Continue regimen as below.  Her amlodipine has been discontinued but she was added on spironolactone  Discharge Diagnoses:  Principal Problem:   Lobar pneumonia ([name]) Active Problems:   Hypothyroidism   Hyperlipidemia associated with type 2 diabetes mellitus ([name])   Type 2 diabetes mellitus without complication, without long-term current use of insulin (HCC)   Atrial fibrillation with RVR (HCC)   Anemia   Sepsis ([name])   Acute respiratory failure with hypoxia ([name])  Acute bronchitis   HTN (hypertension)   Acute on chronic combined systolic and diastolic CHF  (congestive heart failure) ([place])   Discharge Instructions  Allergies as of 10/21/2018   No Known Allergies     Medication List    STOP taking these medications   amLODipine 10 MG tablet Commonly known as:  NORVASC   amLODipine-valsartan 10-320 MG tablet Commonly known as:  EXFORGE     TAKE these medications   atorvastatin 10 MG tablet Commonly known as:  LIPITOR TAKE 1 TABLET EVERY DAY   azithromycin 250 MG tablet Commonly known as:  ZITHROMAX 1 tablet daily for 2 more days   cefdinir 300 MG capsule Commonly known as:  OMNICEF Take 1 capsule (300 mg total) by mouth every 12 (twelve) hours.   cholecalciferol 1000 units tablet Commonly known as:  VITAMIN D Take 5,000 Units by mouth every other day.   cyanocobalamin 1000 MCG/ML injection Commonly known as:  (VITAMIN B-12) 1000 mcg (1 mg) injection once per week for four weeks, followed by 1000 mcg injection once per month.   dorzolamide-timolol 22.3-6.8 MG/ML ophthalmic solution Commonly known as:  COSOPT Place 1 drop into both eyes daily.   furosemide 40 MG tablet Commonly known as:  LASIX TAKE 1 TABLET EVERY DAY   levothyroxine 75 MCG tablet Commonly known as:  SYNTHROID, LEVOTHROID TAKE 1 TABLET (75 MCG TOTAL) BY MOUTH DAILY.   metFORMIN 1000 MG tablet Commonly known as:  GLUCOPHAGE Take 1 tablet (1,000 mg total) by mouth 2 (two) times daily with a meal. What changed:  when to take this   metoprolol succinate 100 MG 24 hr tablet Commonly known as:  TOPROL-XL Take 1 tablet (100 mg total) by mouth daily. Take with or immediately following a meal.   spironolactone 25 MG tablet Commonly known as:  ALDACTONE Take 1 tablet (25 mg total) by mouth daily.   SYRINGE 3CC/25GX1" 25G X 1" 3 ML Misc Use one a week for 4 weeks then once a month after.   travoprost (benzalkonium) 0.004 % ophthalmic solution Commonly known as:  TRAVATAN Place 1 drop into both eyes at bedtime.   valsartan 320 MG tablet Commonly  known as:  DIOVAN Take 1 tablet (320 mg total) by mouth daily.   Xarelto 20 MG Tabs tablet Generic drug:  rivaroxaban TAKE 1 TABLET (20 MG TOTAL) BY MOUTH DAILY WITH SUPPER. What changed:  See the new instructions.      Follow-up Information    Adrian Prows, MD. Schedule an appointment as soon as possible for a visit in 2 week(s).   Specialty:  Cardiology Contact information: [place] [address] [phone]           Consultations:  Cardiology - Dr. Einar Gip  Procedures/Studies:  2D echo  IMPRESSIONS    1. The left ventricle has normal systolic function, with an ejection fraction of 55-60%. The cavity size was normal. Left ventricular diastolic Doppler parameters are indeterminate No evidence of left ventricular regional wall motion abnormalities.  2. Left atrial size was moderately dilated.  3. The mitral valve is degenerative. Mild thickening of the mitral valve leaflet. Mild calcification of the mitral valve leaflet.  4. The tricuspid valve is normal in structure.  5. The aortic valve is tricuspid Mild calcification of the aortic valve.  6. Pulmonary hypertension is mild. PA pressure 40 mm Hg (CVP 15)   Dg Chest 2 View  Result Date: 10/18/2018 CLINICAL DATA:  Shortness of  breath since this morning. Wheezing. EXAM: CHEST - 2 VIEW COMPARISON:  None. FINDINGS: Mildly enlarged cardiac silhouette. Small amount of increased density at the posterior costophrenic angles on the lateral view, possibly in the medial aspect of the left lower lobe on the frontal view. Otherwise, clear lungs. Thoracic and upper lumbar spine degenerative changes. IMPRESSION: 1. Probable mild left lower lobe atelectasis or pneumonia. 2. Mild cardiomegaly. Electronically Signed   By: Claudie Revering M.D.   On: 10/18/2018 20:27     Subjective: - no chest pain, shortness of breath, no abdominal pain, nausea or vomiting.  Wants to go home  Discharge Exam: BP (!) 129/91 (BP  Location: Left Arm) Comment: RN notified  Pulse (!) 101   Temp 97.9 F (36.6 C) (Oral)   Resp 18   Ht 5' (1.524 m)   Wt 94.8 kg   SpO2 97%   BMI 40.82 kg/m   General: Pt is alert, awake, not in acute distress Cardiovascular: Irregularly irregular, trace lower extremity edema Respiratory: CTA bilaterally, no wheezing, no rhonchi Abdominal: Soft, NT, ND, bowel sounds +   The results of significant diagnostics from this hospitalization (including imaging, microbiology, ancillary and laboratory) are listed below for reference.     Microbiology: Recent Results (from the past 240 hour(s))  Culture, blood (x 2)     Status: None (Preliminary result)   Collection Time: 10/19/18 12:12 AM  Result Value Ref Range Status   Specimen Description BLOOD LEFT HAND  Final   Special Requests   Final    BOTTLES DRAWN AEROBIC ONLY Blood Culture adequate volume   Culture   Final    NO GROWTH 1 DAY Performed at [facility], [address] [place], [place] [address]    Report Status PENDING  Incomplete  Culture, blood (x 2)     Status: None (Preliminary result)   Collection Time: 10/19/18  1:51 AM  Result Value Ref Range Status   Specimen Description   Final    BLOOD RIGHT HAND Performed at [facility] [address] [place], [place] [address]    Special Requests   Final    BOTTLES DRAWN AEROBIC ONLY Blood Culture adequate volume Performed at [facility] [address], [place], [place] [address]    Culture   Final    NO GROWTH 1 DAY Performed at [facility], [place] [address] [place],  [address]    Report Status PENDING  Incomplete     Labs: BNP (last 3 results) Recent Labs    10/18/18 2058  BNP 885.0*   Basic Metabolic Panel: Recent Labs  Lab 10/18/18 2058 10/19/18 0950 10/21/18 0403  NA 136 138 138  K 4.5 4.4 4.5  CL 104 104 104  CO2 24 24 28   GLUCOSE 249* 206* 124*  BUN 15 16 25*  CREATININE 0.94 0.96 0.97   CALCIUM 8.6* 8.9 8.4*   Liver Function Tests: Recent Labs  Lab 10/18/18 2058  AST 25  ALT 20  ALKPHOS 95  BILITOT 0.7  PROT 7.0  ALBUMIN 3.6   No results for input(s): LIPASE, AMYLASE in the last 168 hours. No results for input(s): AMMONIA in the last 168 hours. CBC: Recent Labs  Lab 10/18/18 2058 10/19/18 0950  WBC 15.4* 11.9*  NEUTROABS 12.8*  --   HGB 10.4* 12.2  HCT 34.8* 40.3  MCV 96.1 94.8  PLT 319 285   Cardiac Enzymes: Recent Labs  Lab 10/18/18 2058  TROPONINI <0.03   BNP: Invalid input(s): POCBNP CBG: Recent Labs  Lab 10/20/18 0816 10/20/18 1206 10/20/18 1651 10/20/18 2245 10/21/18 0801  GLUCAP 144* 191* 204* 171* 112*   D-Dimer Recent Labs    10/18/18 2058  DDIMER 0.30   Hgb A1c No results for input(s): HGBA1C in the last 72 hours. Lipid Profile No results for input(s): CHOL, HDL, LDLCALC, TRIG, CHOLHDL, LDLDIRECT in the last 72 hours. Thyroid function studies No results for input(s): TSH, T4TOTAL, T3FREE, THYROIDAB in the last 72 hours.  Invalid input(s): FREET3 Anemia work up No results for input(s): VITAMINB12, FOLATE, FERRITIN, TIBC, IRON, RETICCTPCT in the last 72 hours. Urinalysis    Component Value Date/Time   COLORURINE YELLOW 10/18/2018 2259   APPEARANCEUR CLEAR 10/18/2018 2259   LABSPEC 1.016 10/18/2018 2259   PHURINE 5.0 10/18/2018 2259   GLUCOSEU >=500 (A) 10/18/2018 2259   HGBUR SMALL (A) 10/18/2018 2259   BILIRUBINUR NEGATIVE 10/18/2018 2259   KETONESUR NEGATIVE 10/18/2018 2259   PROTEINUR NEGATIVE 10/18/2018 2259   NITRITE NEGATIVE 10/18/2018 2259   LEUKOCYTESUR NEGATIVE 10/18/2018 2259   Sepsis Labs Invalid input(s): PROCALCITONIN,  WBC,  LACTICIDVEN  FURTHER DISCHARGE INSTRUCTIONS:   Get Medicines reviewed and adjusted: Please take all your medications with you for your next visit with your Primary MD   Laboratory/radiological data: Please request your Primary MD to go over all hospital tests and  procedure/radiological results at the follow up, please ask your Primary MD to get all Hospital records sent to his/her office.   In some cases, they will be blood work, cultures and biopsy results pending at the time of your discharge. Please request that your primary care M.D. goes through all the records of your hospital data and follows up on these results.   Also Note the following: If you experience worsening of your admission symptoms, develop shortness of breath, life threatening emergency, suicidal or homicidal thoughts you must seek medical attention immediately by calling 911 or calling your MD immediately  if symptoms less severe.   You must read complete instructions/literature along with all the possible adverse reactions/side effects for all the Medicines you take and that have been prescribed to you. Take any new Medicines after you have completely understood and accpet all the possible adverse reactions/side effects.    Do not drive when taking Pain medications or sleeping medications (Benzodaizepines)   Do not take more than prescribed Pain, Sleep and Anxiety Medications. It is not advisable to combine anxiety,sleep and pain medications without talking with your primary care practitioner   Special Instructions: If you have smoked or chewed Tobacco  in the last 2 yrs please stop smoking, stop any regular Alcohol  and or any Recreational drug use.   Wear Seat belts while driving.   Please note: You were cared for by a hospitalist during your hospital stay. Once you are discharged, your primary care physician will handle any further medical issues. Please note that NO REFILLS for any discharge medications will be authorized once you are discharged, as it is imperative that you return to your primary care physician (or establish a relationship with a primary care physician if you do not have one) for your post hospital discharge needs so that they can reassess your need for  medications and monitor your lab values.  Time coordinating discharge: 35 minutes  SIGNED:  Marzetta Board, PA-S 10/21/2018, 12:16 PM

## 2018-10-21 NOTE — Care Management Important Message (Signed)
Important Message  Patient Details  Name: Crystal Ashley MRN: [id number] Date of Birth: 04/18/1946   Medicare Important Message Given:  Yes    Patryk Conant 10/21/2018, 9:30 AM

## 2018-10-21 NOTE — Discharge Instructions (Signed)
Follow with Dr. Einar Gip in 1-2 weeks  Please get a complete blood count and chemistry panel checked by your Primary MD at your next visit, and again as instructed by your Primary MD. Please get your medications reviewed and adjusted by your Primary MD.  Please request your Primary MD to go over all Hospital Tests and Procedure/Radiological results at the follow up, please get all Hospital records sent to your Prim MD by signing hospital release before you go home.  In some cases, there will be blood work, cultures and biopsy results pending at the time of your discharge. Please request that your primary care M.D. goes through all the records of your hospital data and follows up on these results.  If you had Pneumonia of Lung problems at the Hospital: Please get a 2 view Chest X ray done in 6-8 weeks after hospital discharge or sooner if instructed by your Primary MD.  If you have Congestive Heart Failure: Please call your Cardiologist or Primary MD anytime you have any of the following symptoms:  1) 3 pound weight gain in 24 hours or 5 pounds in 1 week  2) shortness of breath, with or without a dry hacking cough  3) swelling in the hands, feet or stomach  4) if you have to sleep on extra pillows at night in order to breathe  Follow cardiac low salt diet and 1.5 lit/day fluid restriction.  If you have diabetes Accuchecks 4 times/day, Once in AM empty stomach and then before each meal. Log in all results and show them to your primary doctor at your next visit. If any glucose reading is under 80 or above 300 call your primary MD immediately.  If you have Seizure/Convulsions/Epilepsy: Please do not drive, operate heavy machinery, participate in activities at heights or participate in high speed sports until you have seen by Primary MD or a Neurologist and advised to do so again.  If you had Gastrointestinal Bleeding: Please ask your Primary MD to check a complete blood count within one week of  discharge or at your next visit. Your endoscopic/colonoscopic biopsies that are pending at the time of discharge, will also need to followed by your Primary MD.  Get Medicines reviewed and adjusted. Please take all your medications with you for your next visit with your Primary MD  Please request your Primary MD to go over all hospital tests and procedure/radiological results at the follow up, please ask your Primary MD to get all Hospital records sent to his/her office.  If you experience worsening of your admission symptoms, develop shortness of breath, life threatening emergency, suicidal or homicidal thoughts you must seek medical attention immediately by calling 911 or calling your MD immediately  if symptoms less severe.  You must read complete instructions/literature along with all the possible adverse reactions/side effects for all the Medicines you take and that have been prescribed to you. Take any new Medicines after you have completely understood and accpet all the possible adverse reactions/side effects.   Do not drive or operate heavy machinery when taking Pain medications.   Do not take more than prescribed Pain, Sleep and Anxiety Medications  Special Instructions: If you have smoked or chewed Tobacco  in the last 2 yrs please stop smoking, stop any regular Alcohol  and or any Recreational drug use.  Wear Seat belts while driving.  Please note You were cared for by a hospitalist during your hospital stay. If you have any questions about your discharge medications  or the care you received while you were in the hospital after you are discharged, you can call the unit and asked to speak with the hospitalist on call if the hospitalist that took care of you is not available. Once you are discharged, your primary care physician will handle any further medical issues. Please note that NO REFILLS for any discharge medications will be authorized once you are discharged, as it is imperative  that you return to your primary care physician (or establish a relationship with a primary care physician if you do not have one) for your aftercare needs so that they can reassess your need for medications and monitor your lab values.  You can reach the hospitalist office at phone [phone] or fax [phone]   If you do not have a primary care physician, you can call [phone] for a physician referral.  Activity: As tolerated with Full fall precautions use walker/cane & assistance as needed    Diet: heart healthy  Disposition Home

## 2018-10-24 LAB — CULTURE, BLOOD (ROUTINE X 2)
Culture: NO GROWTH
Culture: NO GROWTH
Special Requests: ADEQUATE
Special Requests: ADEQUATE

## 2018-10-27 ENCOUNTER — Telehealth: Payer: Self-pay | Admitting: Family Medicine

## 2018-10-27 ENCOUNTER — Other Ambulatory Visit: Payer: Self-pay

## 2018-10-27 MED ORDER — SPIRONOLACTONE 25 MG PO TABS: 25.0000 mg | ORAL_TABLET | Freq: Every day | ORAL | 1 refills | Status: DC

## 2018-10-27 NOTE — Telephone Encounter (Signed)
Copied from [place] [id number]. Topic: Quick Communication - See Telephone Encounter >> Oct 27, 2018  1:59 PM Bea Graff, NT wrote: CRM for notification. See Telephone encounter for: 10/27/18. Pts husband states that the hospitalist sent in the rx spironolactone (ALDACTONE) 25 MG tablet for his wife but his signature was not accurate according to pharmacy and advised to see if Dr. Juleen China can fill this for the pt.

## 2018-10-27 NOTE — Telephone Encounter (Signed)
Please see msg and advise.  

## 2018-10-27 NOTE — Telephone Encounter (Signed)
See note

## 2018-10-27 NOTE — Telephone Encounter (Signed)
Sent to [facility] in case needed urgently.

## 2018-10-27 NOTE — Telephone Encounter (Signed)
Called spoke to husband wanted sent to mail order. Called local and cancelled and resent to mail order.

## 2018-11-10 ENCOUNTER — Ambulatory Visit: Payer: Self-pay | Admitting: Cardiology

## 2018-11-14 ENCOUNTER — Inpatient Hospital Stay: Payer: Medicare Other | Admitting: Family Medicine

## 2018-12-05 ENCOUNTER — Other Ambulatory Visit: Payer: Self-pay | Admitting: Family Medicine

## 2018-12-05 DIAGNOSIS — I4891 Unspecified atrial fibrillation: Secondary | ICD-10-CM

## 2018-12-05 NOTE — Telephone Encounter (Signed)
Last OV 09/20/18 Last refill Metoprolol Succ 03/15/18 #90/3                 Furosemide 07/30/18 #90/1 Next OV 01/17/19

## 2019-01-16 NOTE — Progress Notes (Signed)
Crystal Ashley is a 73 y.o. female is here for follow up.  History of Present Illness:   HPI:   1. Hypertension associated with diabetes ([place])   2. Atrial fibrillation with RVR ([place])   3. Hyperlipidemia associated with type 2 diabetes mellitus ([place])   4. Type 2 diabetes mellitus without complication, without long-term current use of insulin ([place])   5. Morbid obesity ([place])   6. Acquired hypothyroidism    Medication compliance: compliant all of the time, diabetic diet compliance: compliant most of the time, home glucose monitoring: is not performed, further diabetic ROS: no polyuria or polydipsia, no chest pain, dyspnea or TIA's, no numbness, tingling or pain in extremities, no hypoglycemia, no medication side effects noted, weight has decreased.  Lab Results  Component Value Date   HGBA1C 6.9 (H) 01/17/2019   HGBA1C 6.6 (H) 09/20/2018   HGBA1C 6.3 (A) 06/14/2018   Lab Results  Component Value Date   LDLCALC 54 03/15/2018   CREATININE 1.26 (H) 01/17/2019   Lab Results  Component Value Date   TSH 1.63 03/15/2018   Health Maintenance Due  Topic Date Due  . TETANUS/TDAP  01/22/1965  . Fecal DNA (Cologuard)  01/23/1996  . OPHTHALMOLOGY EXAM  04/07/2018   Depression screen [facility] 2/9 06/14/2018 05/05/2017 12/15/2016  Decreased Interest 0 0 0  Down, Depressed, Hopeless 0 0 0  PHQ - 2 Score 0 0 0   PMHx, SurgHx, SocialHx, FamHx, Medications, and Allergies were reviewed in the Visit Navigator and updated as appropriate.   Patient Active Problem List   Diagnosis Date Noted  . Acute on chronic combined systolic and diastolic CHF (congestive heart failure) ([place])   . Acute respiratory failure with hypoxia ([place]) 10/18/2018  . Declining mobility 09/23/2018  . B12 deficiency 09/23/2018  . Morbid obesity with BMI of 40.0-44.9, adult ([place]) 09/23/2018  . Anemia 09/23/2018  . History of cardioversion 04/26/2018  . Type 2 diabetes mellitus without complication, without long-term  current use of insulin ([place]) 03/15/2018  . Atrial fibrillation with RVR ([place]) 03/15/2018  . Morbid obesity ([place]) 01/04/2017  . Hyperlipidemia associated with type 2 diabetes mellitus ([place]) 01/04/2017  . Vitamin D deficiency 12/15/2016  . Glaucoma   . Hypertension associated with diabetes ([place]) 04/19/2014  . Hypothyroidism 04/19/2014  . History of endometrial cancer 03/30/2014   Social History   Tobacco Use  . Smoking status: Never Smoker  . Smokeless tobacco: Never Used  Substance Use Topics  . Alcohol use: No  . Drug use: Not on file   Current Medications and Allergies   .  amLODipine (NORVASC) 10 MG tablet, Take 1 tablet by mouth daily., Disp: , Rfl:  .  atorvastatin (LIPITOR) 10 MG tablet, TAKE 1 TABLET EVERY DAY, Disp: 90 tablet, Rfl: 3 .  cefdinir (OMNICEF) 300 MG capsule, Take 1 capsule (300 mg total) by mouth every 12 (twelve) hours., Disp: 4 capsule, Rfl: 0 .  cholecalciferol (VITAMIN D) 1000 UNITS tablet, Take 5,000 Units by mouth every other day. , Disp: , Rfl:  .  cyanocobalamin (,VITAMIN B-12,) 1000 MCG/ML injection, 1000 mcg (1 mg) injection once per week for four weeks, followed by 1000 mcg injection once per month., Disp: 30 mL, Rfl: 3 .  dorzolamide-timolol (COSOPT) 22.3-6.8 MG/ML ophthalmic solution, Place 1 drop into both eyes daily. , Disp: , Rfl:  .  furosemide (LASIX) 40 MG tablet, TAKE 1 TABLET EVERY DAY, Disp: 90 tablet, Rfl: 0 .  levothyroxine (SYNTHROID, LEVOTHROID) 75 MCG tablet,  TAKE 1 TABLET (75 MCG TOTAL) BY MOUTH DAILY., Disp: 90 tablet, Rfl: 3 .  metFORMIN (GLUCOPHAGE) 1000 MG tablet, Take 1 tablet (1,000 mg total) by mouth 2 (two) times daily with a meal. (Patient taking differently: Take 1,000 mg by mouth daily with breakfast. ), Disp: 180 tablet, Rfl: 3 .  metoprolol succinate (TOPROL-XL) 100 MG 24 hr tablet, TAKE 1 TABLET (100 MG TOTAL) BY MOUTH DAILY. TAKE WITH OR IMMEDIATELY FOLLOWING A MEAL., Disp: 90 tablet, Rfl: 0 .  spironolactone (ALDACTONE) 25  MG tablet, Take 1 tablet (25 mg total) by mouth daily., Disp: 90 tablet, Rfl: 1 .  Syringe/Needle, Disp, (SYRINGE 3CC/25GX1") 25G X 1" 3 ML MISC, Use one a week for 4 weeks then once a month after., Disp: 12 each, Rfl: 1 .  travoprost, benzalkonium, (TRAVATAN) 0.004 % ophthalmic solution, Place 1 drop into both eyes at bedtime. , Disp: , Rfl:  .  valsartan (DIOVAN) 320 MG tablet, Take 1 tablet (320 mg total) by mouth daily., Disp: 90 tablet, Rfl: 3 .  XARELTO 20 MG TABS tablet, TAKE 1 TABLET (20 MG TOTAL) BY MOUTH DAILY WITH SUPPER. (Patient taking differently: Take 20 mg by mouth daily with supper. High risk med: Anticoagulant.  Crushed Xarelto can be given down a G-tube but NOT a J-Tube.), Disp: 90 tablet, Rfl: 1  No Known Allergies   Review of Systems   Pertinent items are noted in the HPI. Otherwise, a complete ROS is negative.  Vitals   Vitals:   01/17/19 1444  BP: 126/78  Pulse: (!) 105  Temp: 98.6 F (37 C)  TempSrc: Oral  SpO2: 97%  Weight: 198 lb (89.8 kg)  Height: 5' (1.524 m)     Body mass index is 38.67 kg/m.  Physical Exam   Physical Exam Vitals signs and nursing note reviewed.  HENT:     Head: Normocephalic and atraumatic.  Eyes:     Pupils: Pupils are equal, round, and reactive to light.  Neck:     Musculoskeletal: Normal range of motion and neck supple.  Cardiovascular:     Rate and Rhythm: Normal rate and regular rhythm.     Heart sounds: Normal heart sounds.  Pulmonary:     Effort: Pulmonary effort is normal.  Abdominal:     Palpations: Abdomen is soft.  Skin:    General: Skin is warm.  Psychiatric:        Behavior: Behavior normal.    Assessment and Plan   Crystal Ashley was seen today for follow-up.  Diagnoses and all orders for this visit:  Hypertension associated with diabetes ([place])  Atrial fibrillation with RVR ([place]) -     CBC with Differential/Platelet  Hyperlipidemia associated with type 2 diabetes mellitus ([place])  Type 2 diabetes  mellitus without complication, without long-term current use of insulin ([facility]) -     CBC with Differential/Platelet -     Comprehensive metabolic panel -     Hemoglobin A1c  Morbid obesity ([place])  Acquired hypothyroidism  Declining mobility  Vitamin B 12 deficiency -     Vitamin B12   . Orders and follow up as documented in [place], reviewed diet, exercise and weight control, cardiovascular risk and specific lipid/LDL goals reviewed, reviewed medications and side effects in detail.  . Reviewed expectations re: course of current medical issues. . Outlined signs and symptoms indicating need for more acute intervention. . Patient verbalized understanding and all questions were answered. . Patient received an After Visit Summary.  Briscoe Deutscher, DO [place], [place] 01/20/2019  Records requested if needed. Time spent: 25 minutes, of which >50% was spent in obtaining information about her symptoms, reviewing her previous labs, evaluations, and treatments, counseling her about her condition (please see the discussed topics above), and developing a plan to further investigate it; she had a number of questions which I addressed.

## 2019-01-17 ENCOUNTER — Encounter: Payer: Self-pay | Admitting: Family Medicine

## 2019-01-17 ENCOUNTER — Other Ambulatory Visit: Payer: Self-pay

## 2019-01-17 ENCOUNTER — Ambulatory Visit (INDEPENDENT_AMBULATORY_CARE_PROVIDER_SITE_OTHER): Payer: Medicare Other | Admitting: Family Medicine

## 2019-01-17 VITALS — BP 126/78 | HR 105 | Temp 98.6°F | Ht 60.0 in | Wt 198.0 lb

## 2019-01-17 DIAGNOSIS — E119 Type 2 diabetes mellitus without complications: Secondary | ICD-10-CM

## 2019-01-17 DIAGNOSIS — E039 Hypothyroidism, unspecified: Secondary | ICD-10-CM

## 2019-01-17 DIAGNOSIS — I4891 Unspecified atrial fibrillation: Secondary | ICD-10-CM

## 2019-01-17 DIAGNOSIS — E1169 Type 2 diabetes mellitus with other specified complication: Secondary | ICD-10-CM

## 2019-01-17 DIAGNOSIS — E1159 Type 2 diabetes mellitus with other circulatory complications: Secondary | ICD-10-CM

## 2019-01-17 DIAGNOSIS — I10 Essential (primary) hypertension: Secondary | ICD-10-CM | POA: Diagnosis not present

## 2019-01-17 DIAGNOSIS — I152 Hypertension secondary to endocrine disorders: Secondary | ICD-10-CM

## 2019-01-17 DIAGNOSIS — E785 Hyperlipidemia, unspecified: Secondary | ICD-10-CM | POA: Diagnosis not present

## 2019-01-17 DIAGNOSIS — E538 Deficiency of other specified B group vitamins: Secondary | ICD-10-CM

## 2019-01-17 DIAGNOSIS — Z7409 Other reduced mobility: Secondary | ICD-10-CM

## 2019-01-18 LAB — CBC WITH DIFFERENTIAL/PLATELET
Basophils Absolute: 0.1 10*3/uL (ref 0.0–0.1)
Basophils Relative: 0.6 % (ref 0.0–3.0)
Eosinophils Absolute: 0.3 10*3/uL (ref 0.0–0.7)
Eosinophils Relative: 2.9 % (ref 0.0–5.0)
HCT: 36.6 % (ref 36.0–46.0)
Hemoglobin: 12.3 g/dL (ref 12.0–15.0)
Lymphocytes Relative: 22.4 % (ref 12.0–46.0)
Lymphs Abs: 2.4 10*3/uL (ref 0.7–4.0)
MCHC: 33.6 g/dL (ref 30.0–36.0)
MCV: 89.1 fl (ref 78.0–100.0)
Monocytes Absolute: 0.6 10*3/uL (ref 0.1–1.0)
Monocytes Relative: 5.1 % (ref 3.0–12.0)
Neutro Abs: 7.4 10*3/uL (ref 1.4–7.7)
Neutrophils Relative %: 69 % (ref 43.0–77.0)
Platelets: 318 10*3/uL (ref 150.0–400.0)
RBC: 4.11 Mil/uL (ref 3.87–5.11)
RDW: 17 % — ABNORMAL HIGH (ref 11.5–15.5)
WBC: 10.8 10*3/uL — ABNORMAL HIGH (ref 4.0–10.5)

## 2019-01-18 LAB — COMPREHENSIVE METABOLIC PANEL
ALT: 7 U/L (ref 0–35)
AST: 9 U/L (ref 0–37)
Albumin: 3.9 g/dL (ref 3.5–5.2)
Alkaline Phosphatase: 81 U/L (ref 39–117)
BUN: 25 mg/dL — ABNORMAL HIGH (ref 6–23)
CO2: 28 mEq/L (ref 19–32)
Calcium: 9.4 mg/dL (ref 8.4–10.5)
Chloride: 100 mEq/L (ref 96–112)
Creatinine, Ser: 1.26 mg/dL — ABNORMAL HIGH (ref 0.40–1.20)
GFR: 41.63 mL/min — ABNORMAL LOW (ref >60.00)
Glucose, Bld: 88 mg/dL (ref 70–99)
Potassium: 4.8 mEq/L (ref 3.5–5.1)
Sodium: 138 mEq/L (ref 135–145)
Total Bilirubin: 0.5 mg/dL (ref 0.2–1.2)
Total Protein: 6.8 g/dL (ref 6.0–8.3)

## 2019-01-18 LAB — HEMOGLOBIN A1C: Hgb A1c MFr Bld: 6.9 % — ABNORMAL HIGH (ref 4.6–6.5)

## 2019-01-18 LAB — VITAMIN B12: Vitamin B-12: >1500 pg/mL — ABNORMAL HIGH (ref 211–911)

## 2019-01-20 ENCOUNTER — Encounter: Payer: Self-pay | Admitting: Family Medicine

## 2019-01-23 ENCOUNTER — Telehealth: Payer: Self-pay | Admitting: Family Medicine

## 2019-01-23 NOTE — Telephone Encounter (Signed)
Copy mailed.

## 2019-01-23 NOTE — Telephone Encounter (Signed)
Copied from [place] [id number]. Topic: General - Inquiry >> Jan 23, 2019  1:53 PM Virl Axe D wrote: Reason for CRM: Pt's husband stated they have not been able to get pt's latest lab results. They would like for them to be mailed to their home. Please advise.  [place] [id number]

## 2019-02-11 ENCOUNTER — Other Ambulatory Visit: Payer: Self-pay | Admitting: Family Medicine

## 2019-02-11 DIAGNOSIS — E039 Hypothyroidism, unspecified: Secondary | ICD-10-CM

## 2019-02-11 DIAGNOSIS — I4891 Unspecified atrial fibrillation: Secondary | ICD-10-CM

## 2019-03-01 ENCOUNTER — Other Ambulatory Visit: Payer: Self-pay

## 2019-03-01 DIAGNOSIS — I4891 Unspecified atrial fibrillation: Secondary | ICD-10-CM

## 2019-03-01 MED ORDER — RIVAROXABAN 20 MG PO TABS: 20.0000 mg | ORAL_TABLET | Freq: Every day | ORAL | 1 refills | Status: DC

## 2019-04-02 ENCOUNTER — Telehealth: Payer: Self-pay

## 2019-04-02 NOTE — Telephone Encounter (Signed)
Fax received that cologuard order has expired order placed over 365 days ago.

## 2019-04-05 DIAGNOSIS — H25042 Posterior subcapsular polar age-related cataract, left eye: Secondary | ICD-10-CM | POA: Diagnosis not present

## 2019-04-05 DIAGNOSIS — H2512 Age-related nuclear cataract, left eye: Secondary | ICD-10-CM | POA: Diagnosis not present

## 2019-04-05 DIAGNOSIS — H5202 Hypermetropia, left eye: Secondary | ICD-10-CM | POA: Diagnosis not present

## 2019-04-05 DIAGNOSIS — H401134 Primary open-angle glaucoma, bilateral, indeterminate stage: Secondary | ICD-10-CM | POA: Diagnosis not present

## 2019-04-17 ENCOUNTER — Other Ambulatory Visit: Payer: Self-pay

## 2019-04-17 ENCOUNTER — Encounter: Payer: Self-pay | Admitting: Cardiology

## 2019-04-17 ENCOUNTER — Ambulatory Visit (INDEPENDENT_AMBULATORY_CARE_PROVIDER_SITE_OTHER): Payer: Medicare Other | Admitting: Cardiology

## 2019-04-17 VITALS — BP 129/75 | HR 79 | Temp 98.7°F | Ht 60.0 in | Wt 193.8 lb

## 2019-04-17 DIAGNOSIS — I10 Essential (primary) hypertension: Secondary | ICD-10-CM

## 2019-04-17 DIAGNOSIS — I4821 Permanent atrial fibrillation: Secondary | ICD-10-CM

## 2019-04-17 DIAGNOSIS — I5032 Chronic diastolic (congestive) heart failure: Secondary | ICD-10-CM | POA: Diagnosis not present

## 2019-04-17 NOTE — Progress Notes (Signed)
Primary Physician/Referring:  Briscoe Deutscher, DO  Patient ID: Crystal Ashley, female    DOB: August 24, 1945, 73 y.o.   MRN: [id number]  Chief Complaint  Patient presents with  . Follow-up    AFIB   HPI:    Crystal Ashley  is a 73 y.o. Asian [place] female with type 2 diabetes, hyperlipidemia, hypertension, hypothyroidism, history of endometrial cancer in 2015, h/o cardioversion on 04/08/2018 with improvement in fatigue and dyspnea, admitted to [facility] on 10/18/2018 with pneumonia and hypoxemia, eventually discharged home on antibiotics with improvement in symptoms. She was found to be in persistent atrial fibrillation during hospitalization. This is her six-month office visit for management of persistent atrial fibrillation.  States that she's feeling well, denies any dyspnea, chest pain or palpitations.  She has not had any leg edema, PND or orthopnea.  States that she started to walk in and around the house, previously extremely sedentary.  She has lost about 7 pounds in weight since last office visit.  Past Medical History:  Diagnosis Date  . Cancer ([place])   . Cataract   . Glaucoma   . Hypertension   . Thyroid disease    Past Surgical History:  Procedure Laterality Date  . CARDIOVERSION N/A 04/08/2018   Procedure: CARDIOVERSION;  Surgeon: Adrian Prows, MD;  Location: [place];  Service: Cardiovascular;  Laterality: N/A;  . FOOT SURGERY Left   . HYSTEROSCOPY     POLYPECTOMY   Social History   Socioeconomic History  . Marital status: Married    Spouse name: Not on file  . Number of children: 0  . Years of education: Not on file  . Highest education level: Not on file  Occupational History  . Not on file  Social Needs  . Financial resource strain: Not on file  . Food insecurity    Worry: Not on file    Inability: Not on file  . Transportation needs    Medical: Not on file    Non-medical: Not on file  Tobacco Use  . Smoking status: Never Smoker  .  Smokeless tobacco: Never Used  Substance and Sexual Activity  . Alcohol use: No  . Drug use: Not on file  . Sexual activity: Yes  Lifestyle  . Physical activity    Days per week: Not on file    Minutes per session: Not on file  . Stress: Not on file  Relationships  . Social Herbalist on phone: Not on file    Gets together: Not on file    Attends religious service: Not on file    Active member of club or organization: Not on file    Attends meetings of clubs or organizations: Not on file    Relationship status: Not on file  . Intimate partner violence    Fear of current or ex partner: Not on file    Emotionally abused: Not on file    Physically abused: Not on file    Forced sexual activity: Not on file  Other Topics Concern  . Not on file  Social History Narrative  . Not on file   ROS  Review of Systems  Constitution: Negative for chills, decreased appetite, malaise/fatigue and weight gain.  Cardiovascular: Positive for dyspnea on exertion. Negative for leg swelling and syncope.  Endocrine: Negative for cold intolerance.  Hematologic/Lymphatic: Does not bruise/bleed easily.  Musculoskeletal: Negative for joint swelling.  Gastrointestinal: Negative for abdominal pain, anorexia, change in bowel habit,  hematochezia and melena.  Neurological: Negative for headaches and light-headedness.  Psychiatric/Behavioral: Negative for depression and substance abuse.  All other systems reviewed and are negative.  Objective  Blood pressure 129/75, pulse 79, temperature 98.7 F (37.1 C), height 5' (1.524 m), weight 193 lb 12.8 oz (87.9 kg), SpO2 99 %. Body mass index is 37.85 kg/m.   Physical Exam  Constitutional:  Moderately built and moderately obese in no acute distress.  HENT:  Head: Atraumatic.  Eyes: Conjunctivae are normal.  Neck: Neck supple. No JVD present. No thyromegaly present.  Cardiovascular: Intact distal pulses and normal pulses. An irregularly irregular  rhythm present. Exam reveals no gallop, no S3 and no S4.  No murmur heard. S1 is variable, S2 is normal.  No leg edema.   Pulmonary/Chest: Effort normal and breath sounds normal.  Abdominal: Soft. Bowel sounds are normal.  Musculoskeletal: Normal range of motion.        General: No edema.  Neurological: She is alert.  Skin: Skin is warm and dry.  Psychiatric: She has a normal mood and affect.   Radiology: No results found.  Laboratory examination:   Recent Labs    10/18/18 2058 10/19/18 0950 10/21/18 0403 01/17/19 1528  NA 136 138 138 138  K 4.5 4.4 4.5 4.8  CL 104 104 104 100  CO2 24 24 28 28   GLUCOSE 249* 206* 124* 88  BUN 15 16 25* 25*  CREATININE 0.94 0.96 0.97 1.26*  CALCIUM 8.6* 8.9 8.4* 9.4  GFRNONAA >60 59* 58*  --   GFRAA >60 >60 >60  --    CMP Latest Ref Rng & Units 01/17/2019 10/21/2018 10/19/2018  Glucose 70 - 99 mg/dL 88 124(H) 206(H)  BUN 6 - 23 mg/dL 25(H) 25(H) 16  Creatinine 0.40 - 1.20 mg/dL 1.26(H) 0.97 0.96  Sodium 135 - 145 mEq/L 138 138 138  Potassium 3.5 - 5.1 mEq/L 4.8 4.5 4.4  Chloride 96 - 112 mEq/L 100 104 104  CO2 19 - 32 mEq/L 28 28 24   Calcium 8.4 - 10.5 mg/dL 9.4 8.4(L) 8.9  Total Protein 6.0 - 8.3 g/dL 6.8 - -  Total Bilirubin 0.2 - 1.2 mg/dL 0.5 - -  Alkaline Phos 39 - 117 U/L 81 - -  AST 0 - 37 U/L 9 - -  ALT 0 - 35 U/L 7 - -   CBC Latest Ref Rng & Units 01/17/2019 10/19/2018 10/18/2018  WBC 4.0 - 10.5 K/uL 10.8(H) 11.9(H) 15.4(H)  Hemoglobin 12.0 - 15.0 g/dL 12.3 12.2 10.4(L)  Hematocrit 36.0 - 46.0 % 36.6 40.3 34.8(L)  Platelets 150.0 - 400.0 K/uL 318.0 285 319   Lipid Panel     Component Value Date/Time   CHOL 133 03/15/2018 1317   TRIG 183.0 (H) 03/15/2018 1317   HDL 42.20 03/15/2018 1317   CHOLHDL 3 03/15/2018 1317   VLDL 36.6 03/15/2018 1317   LDLCALC 54 03/15/2018 1317   HEMOGLOBIN A1C Lab Results  Component Value Date   HGBA1C 6.9 (H) 01/17/2019   TSH No results for input(s): TSH in the last 8760 hours.  Medications   Prior to Admission medications   Medication Sig Start Date End Date Taking? Authorizing Provider  amLODipine (NORVASC) 10 MG tablet Take 1 tablet by mouth daily. 12/05/18  Yes [provider]  atorvastatin (LIPITOR) 10 MG tablet TAKE 1 TABLET EVERY DAY 06/28/18  Yes Briscoe Deutscher, DO  cholecalciferol (VITAMIN D) 1000 UNITS tablet Take 5,000 Units by mouth every other day.  Yes [provider]  cyanocobalamin (,VITAMIN B-12,) 1000 MCG/ML injection 1000 mcg (1 mg) injection once per week for four weeks, followed by 1000 mcg injection once per month. 09/23/18  Yes Briscoe Deutscher, DO  dorzolamide-timolol (COSOPT) 22.3-6.8 MG/ML ophthalmic solution Place 1 drop into both eyes daily.  10/05/17  Yes [provider]  furosemide (LASIX) 40 MG tablet TAKE 1 TABLET EVERY DAY 02/12/19  Yes Briscoe Deutscher, DO  levothyroxine (SYNTHROID) 75 MCG tablet TAKE 1 TABLET EVERY DAY 02/12/19  Yes Briscoe Deutscher, DO  metFORMIN (GLUCOPHAGE) 1000 MG tablet Take 1 tablet (1,000 mg total) by mouth 2 (two) times daily with a meal. Patient taking differently: Take 1,000 mg by mouth daily with breakfast.  07/12/18  Yes Briscoe Deutscher, DO  metoprolol succinate (TOPROL-XL) 100 MG 24 hr tablet TAKE 1 TABLET DAILY. TAKE WITH OR IMMEDIATELY FOLLOWING A MEAL. 02/12/19  Yes Briscoe Deutscher, DO  rivaroxaban (XARELTO) 20 MG TABS tablet Take 1 tablet (20 mg total) by mouth daily with supper. High risk med: Anticoagulant.  Crushed Xarelto can be given down a G-tube but NOT a J-Tube. 03/01/19  Yes Briscoe Deutscher, DO  spironolactone (ALDACTONE) 25 MG tablet Take 1 tablet (25 mg total) by mouth daily. 10/27/18  Yes Briscoe Deutscher, DO  Syringe/Needle, Disp, (SYRINGE 3CC/25GX1") 25G X 1" 3 ML MISC Use one a week for 4 weeks then once a month after. 09/23/18  Yes Briscoe Deutscher, DO  travoprost, benzalkonium, (TRAVATAN) 0.004 % ophthalmic solution Place 1 drop into both eyes at bedtime.    Yes [provider]  valsartan (DIOVAN) 320 MG tablet Take 1 tablet (320 mg total) by mouth daily. 08/08/18  Yes Briscoe Deutscher, DO     Current Outpatient Medications  Medication Instructions  . amLODipine (NORVASC) 10 MG tablet 1 tablet, Oral, Daily  . atorvastatin (LIPITOR) 10 MG tablet TAKE 1 TABLET EVERY DAY  . cholecalciferol (VITAMIN D) 5,000 Units, Oral, Every other day  . cyanocobalamin (,VITAMIN B-12,) 1000 MCG/ML injection 1000 mcg (1 mg) injection once per week for four weeks, followed by 1000 mcg injection once per month.  . dorzolamide-timolol (COSOPT) 22.3-6.8 MG/ML ophthalmic solution 1 drop, Both Eyes, Daily  . furosemide (LASIX) 40 MG tablet TAKE 1 TABLET EVERY DAY  . levothyroxine (SYNTHROID) 75 MCG tablet TAKE 1 TABLET EVERY DAY  . metFORMIN (GLUCOPHAGE) 1,000 mg, Oral, 2 times daily with meals  . metoprolol succinate (TOPROL-XL) 100 MG 24 hr tablet TAKE 1 TABLET DAILY. TAKE WITH OR IMMEDIATELY FOLLOWING A MEAL.  . rivaroxaban (XARELTO) 20 mg, Oral, Daily with supper, High risk med: Anticoagulant.  Crushed Xarelto can be given down a G-tube but NOT a J-Tube.  . spironolactone (ALDACTONE) 25 mg, Oral, Daily  . Syringe/Needle, Disp, (SYRINGE 3CC/25GX1") 25G X 1" 3 ML MISC Use one a week for 4 weeks then once a month after.  . travoprost, benzalkonium, (TRAVATAN) 0.004 % ophthalmic solution 1 drop, Both Eyes, Daily at bedtime  . valsartan (DIOVAN) 320 mg, Oral, Daily    Cardiac Studies:   Lexiscan myoview stress test 04/01/2018: 1. Lexiscan stress test was performed. Exercise capacity was not assessed. No stress symptoms reported. Normal blood pressure. The resting and stress electrocardiogram demonstrated atrial fibrillation with rapid ventricular rate, low voltage, and normal rest repolarization. Stress EKG is non diagnostic for ischemia as it is a pharmacologic stress. 2. The overall quality of the study is fair. Review of the raw data in a rotational cine format reveals breast  attenuation,  with imaging performed in sitting position. Gated SPECT images reveal normal myocardial thickening and wall motion. The left ventricular ejection fraction was calculated or visually estimated to be 47%. REST and STRESS images demonstrate decreased tracer uptake in the mid inferoseptal, mid inferior, apical septal and apical inferior segments of the left ventricle, worse on rest images. While defect likely represents breast attenuation, ischemia in this region cannot be excluded. Clinical correlation recommended. 3. Low to intermediate risk study.  Echocardiogram 10/19/2018 :   1. The left ventricle has normal systolic function, with an ejection fraction of 55-60%. The cavity size was normal. Left ventricular diastolic Doppler parameters are indeterminate No evidence of left ventricular regional wall motion abnormalities.  2. Left atrial size was moderately dilated.  3. The mitral valve is degenerative. Mild thickening of the mitral valve leaflet. Mild calcification of the mitral valve leaflet.  4. The tricuspid valve is normal in structure.  5. The aortic valve is tricuspid Mild calcification of the aortic valve.  6. Pulmonary hypertension is mild. PA pressure 40 mm Hg (CVP 15)  Assessment     ICD-10-CM   1. Permanent atrial fibrillation  I48.21 EKG 12-Lead   CHA2DS2-VASc Score is 4.  Yearly risk of stroke: 4%.(female, age, hypertension, DM).    2. Chronic diastolic (congestive) heart failure (HCC)  I50.32   3. Essential hypertension  I10     EKG 04/17/2019: Atrial fibrillation with controlled ventricular response at the rate of 84 bpm, normal axis, diffuse nonspecific T abnormality, cannot exclude anterolateral ischemia.  Low-voltage complexes.  Normal QT interval.  Compared to 10/24/18, T wave abnormality new.  Recommendations:   She is here on a six-month office visit and follow-up of atrial fibrillation, patient completely asymptomatic in spite of atrial fibrillation which is  rate controlled.  She is presently doing well and a blood pressure is well controlled, heart rate is well controlled and she has no acute decompensated heart failure.  She is on appropriate medical therapy.  She is also lost about 7-8 pounds in weight was past 6 months.  Encouraged her to continue to remain active previously markedly sedentary now activity as increased and improved.  She is overall feeling well.  Continue anticoagulation for now, I will see her back in 6 months for follow-up.  Adrian Prows, MD, [facility] 04/17/2019, 9:54 PM [facility]. [place] Pager: [phone] Office: [phone] If no answer Cell [phone]

## 2019-04-26 ENCOUNTER — Telehealth: Payer: Self-pay

## 2019-04-26 DIAGNOSIS — I4811 Longstanding persistent atrial fibrillation: Secondary | ICD-10-CM

## 2019-04-26 DIAGNOSIS — I4821 Permanent atrial fibrillation: Secondary | ICD-10-CM

## 2019-04-26 DIAGNOSIS — I10 Essential (primary) hypertension: Secondary | ICD-10-CM

## 2019-04-26 NOTE — Telephone Encounter (Signed)
She was in A. FIb when I saw her, but she is asymptomatic and would not recommend doing anything different. She has lost weight and is also trying to walk everyday and this needs to be encouraged

## 2019-04-26 NOTE — Telephone Encounter (Signed)
LVM on husbands phone.

## 2019-04-26 NOTE — Telephone Encounter (Signed)
Pt husband called he thinks that she is back in afib she has gotten a ekg done and he wants to know what to do?

## 2019-04-27 NOTE — Telephone Encounter (Signed)
Pt husband has called again and really wants you to call him please he wont stop calling

## 2019-04-28 ENCOUNTER — Other Ambulatory Visit: Payer: Self-pay | Admitting: Cardiology

## 2019-04-28 MED ORDER — MULTAQ 400 MG PO TABS: 400.0000 mg | ORAL_TABLET | Freq: Two times a day (BID) | ORAL | 1 refills | Status: DC

## 2019-04-28 NOTE — Addendum Note (Signed)
Addended by: Kela Millin on: 04/28/2019 03:31 PM   Modules accepted: Orders

## 2019-04-28 NOTE — Telephone Encounter (Signed)
I spoke with patient's husband who is a cardiologist that is retired, he states that over the past few weeks that she has been more fatigued than usual and is suspect that atrial fibrillation is contributing to her symptoms.  We discussed whether we should consider antiarrhythmic therapy before repeating cardioversion.  We discussed regarding amiodarone versus Multaq, we'll start her on Multaq 400 mg p.o. b.i.d.  I'll set her up for direct current cardioversion.

## 2019-04-28 NOTE — Telephone Encounter (Signed)
Pt's husband called back again wanting to speak with you personally. I asked if he received the message that shayna left for him and he said yes but he still wants you to call him.//ah

## 2019-05-11 ENCOUNTER — Other Ambulatory Visit: Payer: Self-pay | Admitting: Cardiology

## 2019-05-17 ENCOUNTER — Other Ambulatory Visit: Payer: Self-pay | Admitting: Family Medicine

## 2019-05-17 DIAGNOSIS — E7800 Pure hypercholesterolemia, unspecified: Secondary | ICD-10-CM

## 2019-05-17 DIAGNOSIS — I4891 Unspecified atrial fibrillation: Secondary | ICD-10-CM

## 2019-05-17 MED ORDER — ATORVASTATIN CALCIUM 10 MG PO TABS: 10.0000 mg | ORAL_TABLET | Freq: Every day | ORAL | 3 refills | Status: DC

## 2019-05-17 MED ORDER — VALSARTAN 320 MG PO TABS: 320.0000 mg | ORAL_TABLET | Freq: Every day | ORAL | 3 refills | Status: DC

## 2019-05-17 MED ORDER — METOPROLOL SUCCINATE ER 100 MG PO TB24: ORAL_TABLET | ORAL | 0 refills | Status: DC

## 2019-05-17 MED ORDER — FUROSEMIDE 40 MG PO TABS: 40.0000 mg | ORAL_TABLET | Freq: Every day | ORAL | 0 refills | Status: DC

## 2019-05-17 NOTE — Telephone Encounter (Signed)
Copied from [place] [id number]. Topic: Quick Communication - Rx Refill/Question >> May 17, 2019  2:09 PM Leward Quan A wrote: Medication: atorvastatin (LIPITOR) 10 MG tablet, furosemide (LASIX) 40 MG tablet, metoprolol succinate (TOPROL-XL) 100 MG 24 hr tablet, atorvastatin (LIPITOR) 10 MG tablet, valsartan (DIOVAN) 320 MG tablet   Has the patient contacted their pharmacy? Yes.   (Agent: If no, request that the patient contact the pharmacy for the refill.) (Agent: If yes, when and what did the pharmacy advise?)  Preferred Pharmacy (with phone number or street name):[facility] - [place], [place] - [place] [phone] (Phone) [phone] (Fax)    Agent: Please be advised that RX refills may take up to 3 business days. We ask that you follow-up with your pharmacy.

## 2019-05-18 ENCOUNTER — Other Ambulatory Visit (HOSPITAL_COMMUNITY)
Admission: RE | Admit: 2019-05-18 | Discharge: 2019-05-18 | Disposition: A | Discharge status: Home / Self Care | Payer: Medicare Other | Source: Ambulatory Visit | Attending: Cardiology | Admitting: Cardiology

## 2019-05-18 DIAGNOSIS — Z20828 Contact with and (suspected) exposure to other viral communicable diseases: Secondary | ICD-10-CM | POA: Insufficient documentation

## 2019-05-18 DIAGNOSIS — I10 Essential (primary) hypertension: Secondary | ICD-10-CM | POA: Diagnosis not present

## 2019-05-19 LAB — NOVEL CORONAVIRUS, NAA (HOSP ORDER, SEND-OUT TO REF LAB; TAT 18-24 HRS): SARS-CoV-2, NAA: NOT DETECTED

## 2019-05-19 LAB — BASIC METABOLIC PANEL
BUN/Creatinine Ratio: 11 — ABNORMAL LOW (ref 12–28)
BUN: 15 mg/dL (ref 8–27)
CO2: 24 mmol/L (ref 20–29)
Calcium: 9.7 mg/dL (ref 8.7–10.3)
Chloride: 98 mmol/L (ref 96–106)
Creatinine, Ser: 1.36 mg/dL — ABNORMAL HIGH (ref 0.57–1.00)
GFR calc Af Amer: 45 mL/min/{1.73_m2} — ABNORMAL LOW (ref >59)
GFR calc non Af Amer: 39 mL/min/{1.73_m2} — ABNORMAL LOW (ref >59)
Glucose: 103 mg/dL — ABNORMAL HIGH (ref 65–99)
Potassium: 4.2 mmol/L (ref 3.5–5.2)
Sodium: 141 mmol/L (ref 134–144)

## 2019-05-22 ENCOUNTER — Other Ambulatory Visit: Payer: Self-pay

## 2019-05-22 ENCOUNTER — Encounter (HOSPITAL_COMMUNITY)
Admission: RE | Disposition: A | Discharge status: Home / Self Care | Payer: Self-pay | Source: Home / Self Care | Attending: Cardiology

## 2019-05-22 ENCOUNTER — Ambulatory Visit (HOSPITAL_COMMUNITY): Payer: Medicare Other | Admitting: Certified Registered"

## 2019-05-22 ENCOUNTER — Encounter (HOSPITAL_COMMUNITY): Payer: Self-pay | Admitting: *Deleted

## 2019-05-22 ENCOUNTER — Ambulatory Visit (HOSPITAL_COMMUNITY)
Admission: RE | Admit: 2019-05-22 | Discharge: 2019-05-22 | Disposition: A | Discharge status: Home / Self Care | Payer: Medicare Other | Attending: Cardiology | Admitting: Cardiology

## 2019-05-22 ENCOUNTER — Telehealth: Payer: Self-pay

## 2019-05-22 DIAGNOSIS — E119 Type 2 diabetes mellitus without complications: Secondary | ICD-10-CM | POA: Diagnosis not present

## 2019-05-22 DIAGNOSIS — I4821 Permanent atrial fibrillation: Secondary | ICD-10-CM

## 2019-05-22 DIAGNOSIS — I4819 Other persistent atrial fibrillation: Secondary | ICD-10-CM | POA: Diagnosis not present

## 2019-05-22 DIAGNOSIS — E079 Disorder of thyroid, unspecified: Secondary | ICD-10-CM | POA: Insufficient documentation

## 2019-05-22 DIAGNOSIS — Z7984 Long term (current) use of oral hypoglycemic drugs: Secondary | ICD-10-CM | POA: Diagnosis not present

## 2019-05-22 DIAGNOSIS — Z6839 Body mass index (BMI) 39.0-39.9, adult: Secondary | ICD-10-CM | POA: Diagnosis not present

## 2019-05-22 DIAGNOSIS — E1159 Type 2 diabetes mellitus with other circulatory complications: Secondary | ICD-10-CM

## 2019-05-22 DIAGNOSIS — Z79890 Hormone replacement therapy (postmenopausal): Secondary | ICD-10-CM | POA: Insufficient documentation

## 2019-05-22 DIAGNOSIS — E669 Obesity, unspecified: Secondary | ICD-10-CM | POA: Diagnosis not present

## 2019-05-22 DIAGNOSIS — E039 Hypothyroidism, unspecified: Secondary | ICD-10-CM | POA: Diagnosis not present

## 2019-05-22 DIAGNOSIS — Z7901 Long term (current) use of anticoagulants: Secondary | ICD-10-CM | POA: Insufficient documentation

## 2019-05-22 DIAGNOSIS — H409 Unspecified glaucoma: Secondary | ICD-10-CM | POA: Diagnosis not present

## 2019-05-22 DIAGNOSIS — I10 Essential (primary) hypertension: Secondary | ICD-10-CM | POA: Diagnosis not present

## 2019-05-22 DIAGNOSIS — H269 Unspecified cataract: Secondary | ICD-10-CM | POA: Insufficient documentation

## 2019-05-22 DIAGNOSIS — I4891 Unspecified atrial fibrillation: Secondary | ICD-10-CM | POA: Diagnosis not present

## 2019-05-22 DIAGNOSIS — I110 Hypertensive heart disease with heart failure: Secondary | ICD-10-CM | POA: Diagnosis not present

## 2019-05-22 DIAGNOSIS — Z79899 Other long term (current) drug therapy: Secondary | ICD-10-CM | POA: Diagnosis not present

## 2019-05-22 DIAGNOSIS — I5043 Acute on chronic combined systolic (congestive) and diastolic (congestive) heart failure: Secondary | ICD-10-CM | POA: Diagnosis not present

## 2019-05-22 DIAGNOSIS — I152 Hypertension secondary to endocrine disorders: Secondary | ICD-10-CM

## 2019-05-22 HISTORY — PX: CARDIOVERSION: SHX1299

## 2019-05-22 LAB — GLUCOSE, CAPILLARY: Glucose-Capillary: 132 mg/dL — ABNORMAL HIGH (ref 70–99)

## 2019-05-22 SURGERY — CARDIOVERSION
Anesthesia: General

## 2019-05-22 MED ORDER — LIDOCAINE 2% (20 MG/ML) 5 ML SYRINGE
INTRAMUSCULAR | Status: DC | PRN
  Administered 2019-05-22: 80 mg via INTRAVENOUS

## 2019-05-22 MED ORDER — RIVAROXABAN 15 MG PO TABS: 15.0000 mg | ORAL_TABLET | Freq: Every day | ORAL | 1 refills | Status: DC

## 2019-05-22 MED ORDER — RIVAROXABAN 15 MG PO TABS: 15.0000 mg | ORAL_TABLET | Freq: Every day | ORAL | 2 refills | Status: DC

## 2019-05-22 MED ORDER — PROPOFOL 10 MG/ML IV BOLUS
INTRAVENOUS | Status: DC | PRN
  Administered 2019-05-22: 80 mg via INTRAVENOUS

## 2019-05-22 MED ORDER — SODIUM CHLORIDE 0.9 % IV SOLN
INTRAVENOUS | Status: DC
  Administered 2019-05-22 (×2): via INTRAVENOUS

## 2019-05-22 MED ORDER — AMLODIPINE BESYLATE 10 MG PO TABS: 5.0000 mg | ORAL_TABLET | Freq: Every day | ORAL | Status: DC

## 2019-05-22 NOTE — Transfer of Care (Addendum)
Immediate Anesthesia Transfer of Care Note  Patient: Crystal Ashley  Procedure(s) Performed: CARDIOVERSION (N/A )  Patient Location: Endoscopy Unit  Anesthesia Type:MAC  Level of Consciousness: awake, alert  and oriented  Airway & Oxygen Therapy: Patient Spontanous Breathing and Patient connected to nasal cannula oxygen  Post-op Assessment: Report given to RN, Post -op Vital signs reviewed and stable and Patient moving all extremities  Post vital signs: Reviewed and stable  Last Vitals:  Vitals Value Taken Time  BP    Temp 37.2 C 05/22/19 0757  Pulse 57 05/22/19 0757  Resp 30 05/22/19 0757  SpO2 100 % 05/22/19 0757    Last Pain:  Vitals:   05/22/19 0757  TempSrc: Axillary  PainSc:          Complications: No apparent anesthesia complications

## 2019-05-22 NOTE — Anesthesia Preprocedure Evaluation (Signed)
Anesthesia Evaluation  Patient identified by MRN, date of birth, ID band Patient awake    Reviewed: Allergy & Precautions, NPO status , Patient's Chart, lab work & pertinent test results  Airway Mallampati: III  TM Distance: >3 FB Neck ROM: Full    Dental no notable dental hx. (+) Teeth Intact, Dental Advisory Given   Pulmonary    Pulmonary exam normal breath sounds clear to auscultation       Cardiovascular hypertension, Pt. on medications Normal cardiovascular exam+ dysrhythmias (on xarelto) Atrial Fibrillation  Rhythm:Irregular Rate:Normal     Neuro/Psych negative neurological ROS  negative psych ROS   GI/Hepatic negative GI ROS, Neg liver ROS,   Endo/Other  diabetes, Type 2, Oral Hypoglycemic AgentsHypothyroidism   Renal/GU negative Renal ROS  negative genitourinary   Musculoskeletal negative musculoskeletal ROS (+)   Abdominal (+) + obese,   Peds  Hematology negative hematology ROS (+)   Anesthesia Other Findings   Reproductive/Obstetrics                             Anesthesia Physical  Anesthesia Plan  ASA: III  Anesthesia Plan: General   Post-op Pain Management:    Induction: Intravenous  PONV Risk Score and Plan: 3 and Treatment may vary due to age or medical condition, Ondansetron, Dexamethasone and Midazolam  Airway Management Planned: Natural Airway, Simple Face Mask and Mask  Additional Equipment:   Intra-op Plan:   Post-operative Plan:   Informed Consent: I have reviewed the patients History and Physical, chart, labs and discussed the procedure including the risks, benefits and alternatives for the proposed anesthesia with the patient or authorized representative who has indicated his/her understanding and acceptance.     Dental advisory given  Plan Discussed with: CRNA  Anesthesia Plan Comments:         Anesthesia Quick Evaluation

## 2019-05-22 NOTE — CV Procedure (Addendum)
Direct current cardioversion:  Indication symptomatic A. Fibrillation.  Procedure: Using 80 mg of IV Propofol and 80 IV Lidocaine (for reducing venous pain) for achieving deep sedation, synchronized direct current cardioversion performed. Patient was delivered with 120x1, 150x1, 200J x 2 without success. Multaq discontinued. Continue long term anticoagulation.  Xarelto dose decreased to 15 mg due to Cr Cl < 50 and decreased Amlodipine to 5 mg from 10 mg due to low BP upon presentation.  Adrian Prows, MD, [facility] 05/22/2019, 7:51 AM [facility]. [place] Pager: [phone] Office: [phone] If no answer Cell [phone]

## 2019-05-22 NOTE — Telephone Encounter (Signed)
Progressively worsening kidney function. Will discuss at upcoming appt

## 2019-05-22 NOTE — H&P (Signed)
Primary Physician/Referring:  Briscoe Deutscher, DO  Patient ID: Crystal Ashley, female    DOB: 12-23-1945, 73 y.o.   MRN: [id number]  No chief complaint on file.  HPI:    Crystal Ashley  is a 73 y.o. Asian [place] female with type 2 diabetes, hyperlipidemia, hypertension, hypothyroidism, history of endometrial cancer in 2015, h/o cardioversion on 04/08/2018 with improvement in fatigue and dyspnea, admitted to [facility] on 10/18/2018 with pneumonia and hypoxemia, eventually discharged home on antibiotics with improvement in symptoms. She was found to be in persistent atrial fibrillation during hospitalization. I had seen her for persistent atrial fibrillation.  States that she's feeling well, denies any dyspnea, chest pain or palpitations.  However her husband states she is fatigued and dyspneic. Wanted to try cardioversion again.  He is retired Film/video editor.  Past Medical History:  Diagnosis Date  . Cancer ([place])   . Cataract   . Glaucoma   . Hypertension   . Thyroid disease    Past Surgical History:  Procedure Laterality Date  . CARDIOVERSION N/A 04/08/2018   Procedure: CARDIOVERSION;  Surgeon: Adrian Prows, MD;  Location: [facility];  Service: Cardiovascular;  Laterality: N/A;  . FOOT SURGERY Left   . HYSTEROSCOPY     POLYPECTOMY   Social History   Socioeconomic History  . Marital status: Married    Spouse name: Not on file  . Number of children: 0  . Years of education: Not on file  . Highest education level: Not on file  Occupational History  . Not on file  Social Needs  . Financial resource strain: Not on file  . Food insecurity    Worry: Not on file    Inability: Not on file  . Transportation needs    Medical: Not on file    Non-medical: Not on file  Tobacco Use  . Smoking status: Never Smoker  . Smokeless tobacco: Never Used  Substance and Sexual Activity  . Alcohol use: No  . Drug use: Not on file  . Sexual activity: Yes  Lifestyle  .  Physical activity    Days per week: Not on file    Minutes per session: Not on file  . Stress: Not on file  Relationships  . Social Herbalist on phone: Not on file    Gets together: Not on file    Attends religious service: Not on file    Active member of club or organization: Not on file    Attends meetings of clubs or organizations: Not on file    Relationship status: Not on file  . Intimate partner violence    Fear of current or ex partner: Not on file    Emotionally abused: Not on file    Physically abused: Not on file    Forced sexual activity: Not on file  Other Topics Concern  . Not on file  Social History Narrative  . Not on file   ROS  Review of Systems  Constitution: Negative for chills, decreased appetite, malaise/fatigue and weight gain.  Cardiovascular: Positive for dyspnea on exertion. Negative for leg swelling and syncope.  Endocrine: Negative for cold intolerance.  Hematologic/Lymphatic: Does not bruise/bleed easily.  Musculoskeletal: Negative for joint swelling.  Gastrointestinal: Negative for abdominal pain, anorexia, change in bowel habit, hematochezia and melena.  Neurological: Negative for headaches and light-headedness.  Psychiatric/Behavioral: Negative for depression and substance abuse.  All other systems reviewed and are negative.  Objective  Blood pressure Marland Kitchen)  105/52, temperature 98 F (36.7 C), temperature source Oral, resp. rate (!) 22, height 5' (1.524 m), weight 90.7 kg, SpO2 100 %. Body mass index is 39.06 kg/m.   Physical Exam  Constitutional:  Moderately built and moderately obese in no acute distress.  HENT:  Head: Atraumatic.  Eyes: Conjunctivae are normal.  Neck: Neck supple. No JVD present. No thyromegaly present.  Cardiovascular: Intact distal pulses and normal pulses. An irregularly irregular rhythm present. Exam reveals no gallop, no S3 and no S4.  No murmur heard. S1 is variable, S2 is normal.  No leg edema.    Pulmonary/Chest: Effort normal and breath sounds normal.  Abdominal: Soft. Bowel sounds are normal.  Musculoskeletal: Normal range of motion.        General: No edema.  Neurological: She is alert.  Skin: Skin is warm and dry.  Psychiatric: She has a normal mood and affect.   Radiology: No results found.  Laboratory examination:   Recent Labs    10/19/18 0950 10/21/18 0403 01/17/19 1528 05/18/19 1539  NA 138 138 138 141  K 4.4 4.5 4.8 4.2  CL 104 104 100 98  CO2 24 28 28 24   GLUCOSE 206* 124* 88 103*  BUN 16 25* 25* 15  CREATININE 0.96 0.97 1.26* 1.36*  CALCIUM 8.9 8.4* 9.4 9.7  GFRNONAA 59* 58*  --  39*  GFRAA >60 >60  --  45*   CMP Latest Ref Rng & Units 05/18/2019 01/17/2019 10/21/2018  Glucose 65 - 99 mg/dL 103(H) 88 124(H)  BUN 8 - 27 mg/dL 15 25(H) 25(H)  Creatinine 0.57 - 1.00 mg/dL 1.36(H) 1.26(H) 0.97  Sodium 134 - 144 mmol/L 141 138 138  Potassium 3.5 - 5.2 mmol/L 4.2 4.8 4.5  Chloride 96 - 106 mmol/L 98 100 104  CO2 20 - 29 mmol/L 24 28 28   Calcium 8.7 - 10.3 mg/dL 9.7 9.4 8.4(L)  Total Protein 6.0 - 8.3 g/dL - 6.8 -  Total Bilirubin 0.2 - 1.2 mg/dL - 0.5 -  Alkaline Phos 39 - 117 U/L - 81 -  AST 0 - 37 U/L - 9 -  ALT 0 - 35 U/L - 7 -   CBC Latest Ref Rng & Units 01/17/2019 10/19/2018 10/18/2018  WBC 4.0 - 10.5 K/uL 10.8(H) 11.9(H) 15.4(H)  Hemoglobin 12.0 - 15.0 g/dL 12.3 12.2 10.4(L)  Hematocrit 36.0 - 46.0 % 36.6 40.3 34.8(L)  Platelets 150.0 - 400.0 K/uL 318.0 285 319   Lipid Panel     Component Value Date/Time   CHOL 133 03/15/2018 1317   TRIG 183.0 (H) 03/15/2018 1317   HDL 42.20 03/15/2018 1317   CHOLHDL 3 03/15/2018 1317   VLDL 36.6 03/15/2018 1317   LDLCALC 54 03/15/2018 1317   HEMOGLOBIN A1C Lab Results  Component Value Date   HGBA1C 6.9 (H) 01/17/2019   TSH No results for input(s): TSH in the last 8760 hours. Medications   Prior to Admission medications   Medication Sig Start Date End Date Taking? Authorizing Provider  amLODipine  (NORVASC) 10 MG tablet Take 1 tablet by mouth daily. 12/05/18  Yes [provider]  atorvastatin (LIPITOR) 10 MG tablet TAKE 1 TABLET EVERY DAY 06/28/18  Yes Briscoe Deutscher, DO  cholecalciferol (VITAMIN D) 1000 UNITS tablet Take 5,000 Units by mouth every other day.    Yes [provider]  cyanocobalamin (,VITAMIN B-12,) 1000 MCG/ML injection 1000 mcg (1 mg) injection once per week for four weeks, followed by 1000 mcg injection once per month.  09/23/18  Yes Briscoe Deutscher, DO  dorzolamide-timolol (COSOPT) 22.3-6.8 MG/ML ophthalmic solution Place 1 drop into both eyes daily.  10/05/17  Yes [provider]  furosemide (LASIX) 40 MG tablet TAKE 1 TABLET EVERY DAY 02/12/19  Yes Briscoe Deutscher, DO  levothyroxine (SYNTHROID) 75 MCG tablet TAKE 1 TABLET EVERY DAY 02/12/19  Yes Briscoe Deutscher, DO  metFORMIN (GLUCOPHAGE) 1000 MG tablet Take 1 tablet (1,000 mg total) by mouth 2 (two) times daily with a meal. Patient taking differently: Take 1,000 mg by mouth daily with breakfast.  07/12/18  Yes Briscoe Deutscher, DO  metoprolol succinate (TOPROL-XL) 100 MG 24 hr tablet TAKE 1 TABLET DAILY. TAKE WITH OR IMMEDIATELY FOLLOWING A MEAL. 02/12/19  Yes Briscoe Deutscher, DO  rivaroxaban (XARELTO) 20 MG TABS tablet Take 1 tablet (20 mg total) by mouth daily with supper. High risk med: Anticoagulant.  Crushed Xarelto can be given down a G-tube but NOT a J-Tube. 03/01/19  Yes Briscoe Deutscher, DO  spironolactone (ALDACTONE) 25 MG tablet Take 1 tablet (25 mg total) by mouth daily. 10/27/18  Yes Briscoe Deutscher, DO  Syringe/Needle, Disp, (SYRINGE 3CC/25GX1") 25G X 1" 3 ML MISC Use one a week for 4 weeks then once a month after. 09/23/18  Yes Briscoe Deutscher, DO  travoprost, benzalkonium, (TRAVATAN) 0.004 % ophthalmic solution Place 1 drop into both eyes at bedtime.    Yes [provider]  valsartan (DIOVAN) 320 MG tablet Take 1 tablet (320 mg total) by mouth daily. 08/08/18  Yes Briscoe Deutscher, DO      Current Outpatient Medications  Medication Instructions  . amLODipine (NORVASC) 10 mg, Oral, Daily  . atorvastatin (LIPITOR) 10 mg, Oral, Daily  . cholecalciferol (VITAMIN D) 5,000 Units, Oral, See admin instructions, Sun, Mon, Wed, friday  . cyanocobalamin (,VITAMIN B-12,) 1000 MCG/ML injection 1000 mcg (1 mg) injection once per week for four weeks, followed by 1000 mcg injection once per month.  . dorzolamide-timolol (COSOPT) 22.3-6.8 MG/ML ophthalmic solution 1 drop, Both Eyes, 2 times daily  . furosemide (LASIX) 40 mg, Oral, Daily  . levothyroxine (SYNTHROID) 75 MCG tablet TAKE 1 TABLET EVERY DAY  . metFORMIN (GLUCOPHAGE) 1,000 mg, Oral, 2 times daily with meals  . metoprolol succinate (TOPROL-XL) 100 MG 24 hr tablet TAKE 1 TABLET DAILY. TAKE WITH OR IMMEDIATELY FOLLOWING A MEAL.  . Multaq 400 mg, Oral, 2 times daily with meals  . rivaroxaban (XARELTO) 20 mg, Oral, Daily with supper, High risk med: Anticoagulant.  Crushed Xarelto can be given down a G-tube but NOT a J-Tube.  . spironolactone (ALDACTONE) 25 mg, Oral, Daily  . Syringe/Needle, Disp, (SYRINGE 3CC/25GX1") 25G X 1" 3 ML MISC Use one a week for 4 weeks then once a month after.  . travoprost, benzalkonium, (TRAVATAN) 0.004 % ophthalmic solution 1 drop, Both Eyes, Daily at bedtime  . valsartan (DIOVAN) 320 mg, Oral, Daily    Cardiac Studies:   Lexiscan myoview stress test 04/01/2018: 1. Lexiscan stress test was performed. Exercise capacity was not assessed. No stress symptoms reported. Normal blood pressure. The resting and stress electrocardiogram demonstrated atrial fibrillation with rapid ventricular rate, low voltage, and normal rest repolarization. Stress EKG is non diagnostic for ischemia as it is a pharmacologic stress. 2. The overall quality of the study is fair. Review of the raw data in a rotational cine format reveals breast attenuation, with imaging performed in sitting position. Gated SPECT images reveal normal  myocardial thickening and wall motion. The left ventricular ejection fraction was calculated or  visually estimated to be 47%. REST and STRESS images demonstrate decreased tracer uptake in the mid inferoseptal, mid inferior, apical septal and apical inferior segments of the left ventricle, worse on rest images. While defect likely represents breast attenuation, ischemia in this region cannot be excluded. Clinical correlation recommended. 3. Low to intermediate risk study.  Echocardiogram 10/19/2018 :   1. The left ventricle has normal systolic function, with an ejection fraction of 55-60%. The cavity size was normal. Left ventricular diastolic Doppler parameters are indeterminate No evidence of left ventricular regional wall motion abnormalities.  2. Left atrial size was moderately dilated.  3. The mitral valve is degenerative. Mild thickening of the mitral valve leaflet. Mild calcification of the mitral valve leaflet.  4. The tricuspid valve is normal in structure.  5. The aortic valve is tricuspid Mild calcification of the aortic valve.  6. Pulmonary hypertension is mild. PA pressure 40 mm Hg (CVP 15)  Assessment   Persistent atrial fib. CHA2DS2-VASc Score is 3.  Yearly risk of stroke: 3.2%.  Score of 1=1.3; 2=2.2; 3=3.2; 4=4; 5=6.7; 6=9.8; 7=>9.8) -(CHF; HTN; vasc disease DM,  Female = 1; Age <65 =0; 65-74 = 1,  >75 =2; stroke = 2).   EKG 04/17/2019: Atrial fibrillation with controlled ventricular response at the rate of 84 bpm, normal axis, diffuse nonspecific T abnormality, cannot exclude anterolateral ischemia.  Low-voltage complexes.  Normal QT interval.  Compared to 10/24/18, T wave abnormality new.  Recommendations:   She is now on Multaq and Xarelto. Will proceed with DCCV.   Schedule for Direct current cardioversion. I have discussed regarding risks benefits rate control vs rhythm control with the patient. Patient understands cardiac arrest and need for CPR, aspiration pneumonia, but  not limited to these. Patient is willing.   Adrian Prows, MD, [facility] 05/22/2019, 7:36 AM [facility] [place] Pager: [phone] Office: [phone] If no answer Cell [phone]

## 2019-05-22 NOTE — Telephone Encounter (Signed)
Pt husband called to ask for you can give hi a call back to explain to them what they need to do after the procedure she had today. Phone number is [phone]. Thank you

## 2019-05-22 NOTE — Interval H&P Note (Signed)
History and Physical Interval Note:  05/22/2019 7:39 AM  Crystal Ashley  has presented today for surgery, with the diagnosis of atrial fibrillation.  The various methods of treatment have been discussed with the patient and family. After consideration of risks, benefits and other options for treatment, the patient has consented to  Procedure(s): CARDIOVERSION (N/A) as a surgical intervention.  The patient's history has been reviewed, patient examined, no change in status, stable for surgery.  I have reviewed the patient's chart and labs.  Questions were answered to the patient's satisfaction.     Adrian Prows

## 2019-05-22 NOTE — Anesthesia Postprocedure Evaluation (Signed)
Anesthesia Post Note  Patient: Crystal Ashley  Procedure(s) Performed: CARDIOVERSION (N/A )     Patient location during evaluation: Endoscopy Anesthesia Type: General Level of consciousness: awake and alert Pain management: pain level controlled Vital Signs Assessment: post-procedure vital signs reviewed and stable Respiratory status: spontaneous breathing, nonlabored ventilation and respiratory function stable Cardiovascular status: blood pressure returned to baseline and stable Postop Assessment: no apparent nausea or vomiting Anesthetic complications: no    Last Vitals:  Vitals:   05/22/19 0920 05/22/19 0925  BP: 92/63 (!) 91/41  Pulse: (!) 52 (!) 45  Resp: 16 16  Temp:    SpO2: 100% 100%    Last Pain:  Vitals:   05/22/19 0925  TempSrc:   PainSc: 0-No pain                 Lynda Rainwater

## 2019-05-22 NOTE — Telephone Encounter (Signed)
I had left a message earlier today with Dr. Doyne Keel regarding failure of cardioversion.  I just spoke to him explained to him regarding options including trying amiodarone versus rate control only.  He understands that the chances of maintaining sinus rhythm is very low and agrees to poor rate control strategy only.  I have again discussed with him regarding decreasing amlodipine dose due to low blood pressure.  Advised him that he should continue to increase his wife to continue to lose weight and to be physically active as possible.  I will continue to reiterate the same.  He was pleased with the discussion.

## 2019-05-22 NOTE — Discharge Instructions (Signed)

## 2019-05-23 ENCOUNTER — Encounter (HOSPITAL_COMMUNITY): Payer: Self-pay | Admitting: Cardiology

## 2019-05-25 ENCOUNTER — Encounter: Payer: Self-pay | Admitting: Family Medicine

## 2019-05-25 ENCOUNTER — Ambulatory Visit (INDEPENDENT_AMBULATORY_CARE_PROVIDER_SITE_OTHER): Payer: Medicare Other

## 2019-05-25 DIAGNOSIS — Z23 Encounter for immunization: Secondary | ICD-10-CM

## 2019-06-01 ENCOUNTER — Ambulatory Visit (INDEPENDENT_AMBULATORY_CARE_PROVIDER_SITE_OTHER): Payer: Medicare Other | Admitting: Cardiology

## 2019-06-01 ENCOUNTER — Other Ambulatory Visit: Payer: Self-pay

## 2019-06-01 ENCOUNTER — Encounter: Payer: Self-pay | Admitting: Cardiology

## 2019-06-01 VITALS — BP 124/81 | HR 76 | Temp 96.9°F | Ht 60.0 in | Wt 184.9 lb

## 2019-06-01 DIAGNOSIS — I10 Essential (primary) hypertension: Secondary | ICD-10-CM

## 2019-06-01 DIAGNOSIS — I4821 Permanent atrial fibrillation: Secondary | ICD-10-CM | POA: Diagnosis not present

## 2019-06-01 DIAGNOSIS — B359 Dermatophytosis, unspecified: Secondary | ICD-10-CM

## 2019-06-01 DIAGNOSIS — I5032 Chronic diastolic (congestive) heart failure: Secondary | ICD-10-CM

## 2019-06-01 MED ORDER — KETOCONAZOLE 2 % EX CREA: 1.0000 "application " | TOPICAL_CREAM | Freq: Every day | CUTANEOUS | 3 refills | Status: DC

## 2019-06-01 NOTE — Progress Notes (Signed)
Primary Physician/Referring:  Briscoe Deutscher, DO  Patient ID: Crystal Ashley, female    DOB: August 29, 1945, 73 y.o.   MRN: [id number]  Chief Complaint  Patient presents with  . Atrial Fibrillation  . Follow-up    cardioversion   HPI:    Crystal Ashley  is a 73 y.o. Asian [place] female with type 2 diabetes, hyperlipidemia, hypertension, hypothyroidism, history of endometrial cancer in 2015, h/o cardioversion on 04/08/2018 with improvement in fatigue and dyspnea, admitted to [facility] on 10/18/2018 with pneumonia and hypoxemia, eventually discharged home on antibiotics with improvement in symptoms. She was found to be in persistent atrial fibrillation during hospitalization.   She was seen by me on 8/31 and although she denied symptoms, her husband stated she has reduced activity and dyspnea and wanted to attempt cardioversion. He is a retired Film/video editor.  Had failed cardioversion attempt on 05/22/19. Now presents for f/u.  States that she's feeling well, denies any dyspnea, chest pain or palpitations.  She has not had any leg edema, PND or orthopnea.  States that she started to walk in and around the house, previously extremely sedentary.  She has been loosing weight and feels good.  Past Medical History:  Diagnosis Date  . Cancer ([place])   . Cataract   . Glaucoma   . Hypertension   . Thyroid disease    Past Surgical History:  Procedure Laterality Date  . CARDIOVERSION N/A 04/08/2018   Procedure: CARDIOVERSION;  Surgeon: Adrian Prows, MD;  Location: [facility] ENDOSCOPY;  Service: Cardiovascular;  Laterality: N/A;  . CARDIOVERSION N/A 05/22/2019   Procedure: CARDIOVERSION;  Surgeon: Adrian Prows, MD;  Location: [facility];  Service: Cardiovascular;  Laterality: N/A;  . FOOT SURGERY Left   . HYSTEROSCOPY     POLYPECTOMY   Social History   Socioeconomic History  . Marital status: Married    Spouse name: Not on file  . Number of children: 0  . Years of education: Not on file   . Highest education level: Not on file  Occupational History  . Not on file  Social Needs  . Financial resource strain: Not on file  . Food insecurity    Worry: Not on file    Inability: Not on file  . Transportation needs    Medical: Not on file    Non-medical: Not on file  Tobacco Use  . Smoking status: Never Smoker  . Smokeless tobacco: Never Used  Substance and Sexual Activity  . Alcohol use: No  . Drug use: Not on file  . Sexual activity: Yes  Lifestyle  . Physical activity    Days per week: Not on file    Minutes per session: Not on file  . Stress: Not on file  Relationships  . Social Herbalist on phone: Not on file    Gets together: Not on file    Attends religious service: Not on file    Active member of club or organization: Not on file    Attends meetings of clubs or organizations: Not on file    Relationship status: Not on file  . Intimate partner violence    Fear of current or ex partner: Not on file    Emotionally abused: Not on file    Physically abused: Not on file    Forced sexual activity: Not on file  Other Topics Concern  . Not on file  Social History Narrative  . Not on file   ROS  Review of Systems  Constitution: Negative for chills, decreased appetite, malaise/fatigue and weight gain.  Cardiovascular: Positive for dyspnea on exertion. Negative for leg swelling and syncope.  Endocrine: Negative for cold intolerance.  Hematologic/Lymphatic: Does not bruise/bleed easily.  Musculoskeletal: Negative for joint swelling.  Gastrointestinal: Negative for abdominal pain, anorexia, change in bowel habit, hematochezia and melena.  Neurological: Negative for headaches and light-headedness.  Psychiatric/Behavioral: Negative for depression and substance abuse.  All other systems reviewed and are negative.  Objective    Vitals with BMI 06/01/2019 05/22/2019 05/22/2019  Height 5\' 0"  - -  Weight 184 lbs 14 oz - -  BMI [id number] - -  Systolic [id number] 91  92  Diastolic 81 41 63  Pulse 76 45 52    Physical Exam  Constitutional:  Moderately built and moderately obese in no acute distress.  HENT:  Head: Atraumatic.  Eyes: Conjunctivae are normal.  Neck: Neck supple. No JVD present. No thyromegaly present.  Cardiovascular: Intact distal pulses and normal pulses. An irregularly irregular rhythm present. Exam reveals no gallop, no S3 and no S4.  No murmur heard. S1 is variable, S2 is normal.  No leg edema.   Pulmonary/Chest: Effort normal and breath sounds normal.  Abdominal: Soft. Bowel sounds are normal.  Musculoskeletal: Normal range of motion.        General: No edema.  Neurological: She is alert.  Skin: Skin is warm and dry.  Psychiatric: She has a normal mood and affect.    Laboratory examination:   Recent Labs    10/19/18 0950 10/21/18 0403 01/17/19 1528 05/18/19 1539  NA 138 138 138 141  K 4.4 4.5 4.8 4.2  CL 104 104 100 98  CO2 24 28 28 24   GLUCOSE 206* 124* 88 103*  BUN 16 25* 25* 15  CREATININE 0.96 0.97 1.26* 1.36*  CALCIUM 8.9 8.4* 9.4 9.7  GFRNONAA 59* 58*  --  39*  GFRAA >60 >60  --  45*   CMP Latest Ref Rng & Units 05/18/2019 01/17/2019 10/21/2018  Glucose 65 - 99 mg/dL 103(H) 88 124(H)  BUN 8 - 27 mg/dL 15 25(H) 25(H)  Creatinine 0.57 - 1.00 mg/dL 1.36(H) 1.26(H) 0.97  Sodium 134 - 144 mmol/L 141 138 138  Potassium 3.5 - 5.2 mmol/L 4.2 4.8 4.5  Chloride 96 - 106 mmol/L 98 100 104  CO2 20 - 29 mmol/L 24 28 28   Calcium 8.7 - 10.3 mg/dL 9.7 9.4 8.4(L)  Total Protein 6.0 - 8.3 g/dL - 6.8 -  Total Bilirubin 0.2 - 1.2 mg/dL - 0.5 -  Alkaline Phos 39 - 117 U/L - 81 -  AST 0 - 37 U/L - 9 -  ALT 0 - 35 U/L - 7 -   CBC Latest Ref Rng & Units 01/17/2019 10/19/2018 10/18/2018  WBC 4.0 - 10.5 K/uL 10.8(H) 11.9(H) 15.4(H)  Hemoglobin 12.0 - 15.0 g/dL 12.3 12.2 10.4(L)  Hematocrit 36.0 - 46.0 % 36.6 40.3 34.8(L)  Platelets 150.0 - 400.0 K/uL 318.0 285 319   Lipid Panel     Component Value Date/Time   CHOL 133  03/15/2018 1317   TRIG 183.0 (H) 03/15/2018 1317   HDL 42.20 03/15/2018 1317   CHOLHDL 3 03/15/2018 1317   VLDL 36.6 03/15/2018 1317   LDLCALC 54 03/15/2018 1317   HEMOGLOBIN A1C Lab Results  Component Value Date   HGBA1C 6.9 (H) 01/17/2019   TSH No results for input(s): TSH in the last 8760 hours. Medications   Prior to Admission  medications   Medication Sig Start Date End Date Taking? Authorizing Provider  amLODipine (NORVASC) 10 MG tablet Take 1 tablet by mouth daily. 12/05/18  Yes [provider]  atorvastatin (LIPITOR) 10 MG tablet TAKE 1 TABLET EVERY DAY 06/28/18  Yes Briscoe Deutscher, DO  cholecalciferol (VITAMIN D) 1000 UNITS tablet Take 5,000 Units by mouth every other day.    Yes [provider]  cyanocobalamin (,VITAMIN B-12,) 1000 MCG/ML injection 1000 mcg (1 mg) injection once per week for four weeks, followed by 1000 mcg injection once per month. 09/23/18  Yes Briscoe Deutscher, DO  dorzolamide-timolol (COSOPT) 22.3-6.8 MG/ML ophthalmic solution Place 1 drop into both eyes daily.  10/05/17  Yes [provider]  furosemide (LASIX) 40 MG tablet TAKE 1 TABLET EVERY DAY 02/12/19  Yes Briscoe Deutscher, DO  levothyroxine (SYNTHROID) 75 MCG tablet TAKE 1 TABLET EVERY DAY 02/12/19  Yes Briscoe Deutscher, DO  metFORMIN (GLUCOPHAGE) 1000 MG tablet Take 1 tablet (1,000 mg total) by mouth 2 (two) times daily with a meal. Patient taking differently: Take 1,000 mg by mouth daily with breakfast.  07/12/18  Yes Briscoe Deutscher, DO  metoprolol succinate (TOPROL-XL) 100 MG 24 hr tablet TAKE 1 TABLET DAILY. TAKE WITH OR IMMEDIATELY FOLLOWING A MEAL. 02/12/19  Yes Briscoe Deutscher, DO  rivaroxaban (XARELTO) 20 MG TABS tablet Take 1 tablet (20 mg total) by mouth daily with supper. High risk med: Anticoagulant.  Crushed Xarelto can be given down a G-tube but NOT a J-Tube. 03/01/19  Yes Briscoe Deutscher, DO  spironolactone (ALDACTONE) 25 MG tablet Take 1 tablet (25 mg total) by mouth daily.  10/27/18  Yes Briscoe Deutscher, DO  Syringe/Needle, Disp, (SYRINGE 3CC/25GX1") 25G X 1" 3 ML MISC Use one a week for 4 weeks then once a month after. 09/23/18  Yes Briscoe Deutscher, DO  travoprost, benzalkonium, (TRAVATAN) 0.004 % ophthalmic solution Place 1 drop into both eyes at bedtime.    Yes [provider]  valsartan (DIOVAN) 320 MG tablet Take 1 tablet (320 mg total) by mouth daily. 08/08/18  Yes Briscoe Deutscher, DO     Current Outpatient Medications  Medication Instructions  . amLODipine (NORVASC) 5 mg, Oral, Daily  . atorvastatin (LIPITOR) 10 mg, Oral, Daily  . cholecalciferol (VITAMIN D) 5,000 Units, Oral, See admin instructions, Sun, Mon, Wed, friday  . cyanocobalamin (,VITAMIN B-12,) 1000 MCG/ML injection 1000 mcg (1 mg) injection once per week for four weeks, followed by 1000 mcg injection once per month.  . dorzolamide-timolol (COSOPT) 22.3-6.8 MG/ML ophthalmic solution 1 drop, Both Eyes, 2 times daily  . ketoconazole (NIZORAL) 2 % cream 1 application, Topical, Daily  . levothyroxine (SYNTHROID) 75 MCG tablet TAKE 1 TABLET EVERY DAY  . metFORMIN (GLUCOPHAGE) 1,000 mg, Oral, 2 times daily with meals  . metoprolol succinate (TOPROL-XL) 100 MG 24 hr tablet TAKE 1 TABLET DAILY. TAKE WITH OR IMMEDIATELY FOLLOWING A MEAL.  Marland Kitchen Rivaroxaban (XARELTO) 15 mg, Oral, Daily with supper  . Syringe/Needle, Disp, (SYRINGE 3CC/25GX1") 25G X 1" 3 ML MISC Use one a week for 4 weeks then once a month after.  . travoprost, benzalkonium, (TRAVATAN) 0.004 % ophthalmic solution 1 drop, Both Eyes, Daily at bedtime  . valsartan (DIOVAN) 320 mg, Oral, Daily    Cardiac Studies:   Lexiscan myoview stress test 04/01/2018: 1. Lexiscan stress test was performed. Exercise capacity was not assessed. No stress symptoms reported. Normal blood pressure. The resting and stress electrocardiogram demonstrated atrial fibrillation with rapid ventricular rate, low voltage, and normal  rest repolarization. Stress EKG is  non diagnostic for ischemia as it is a pharmacologic stress. 2. The overall quality of the study is fair. Review of the raw data in a rotational cine format reveals breast attenuation, with imaging performed in sitting position. Gated SPECT images reveal normal myocardial thickening and wall motion. The left ventricular ejection fraction was calculated or visually estimated to be 47%. REST and STRESS images demonstrate decreased tracer uptake in the mid inferoseptal, mid inferior, apical septal and apical inferior segments of the left ventricle, worse on rest images. While defect likely represents breast attenuation, ischemia in this region cannot be excluded. Clinical correlation recommended. 3. Low to intermediate risk study.  Echocardiogram 10/19/2018 :   1. The left ventricle has normal systolic function, with an ejection fraction of 55-60%. The cavity size was normal. Left ventricular diastolic Doppler parameters are indeterminate No evidence of left ventricular regional wall motion abnormalities.  2. Left atrial size was moderately dilated.  3. The mitral valve is degenerative. Mild thickening of the mitral valve leaflet. Mild calcification of the mitral valve leaflet.  4. The tricuspid valve is normal in structure.  5. The aortic valve is tricuspid Mild calcification of the aortic valve.  6. Pulmonary hypertension is mild. PA pressure 40 mm Hg (CVP 15)  Assessment     ICD-10-CM   1. Permanent atrial fibrillation (HCC)  I48.21 EKG 12-Lead   CHA2DS2-VASc Score is 4.  Yearly risk of stroke: 4%. (F, A, HTN, DM)  2. Essential hypertension  I10   3. Chronic diastolic (congestive) heart failure (HCC)  I50.32   4. Tinea  B35.9 ketoconazole (NIZORAL) 2 % cream   Under breast    EKG 05/31/2019: Atrial fibrillation with controlled ventricular response at the rate of 77 bpm, normal axis.  Nonspecific T abnormality, cannot exclude anterolateral ischemia.  Low-voltage complexes, consider pulmonary  disease pattern. No significant change from  EKG 04/17/2019   Recommendations:   Patient is here for follow-up of post direct current cardioversion, she was unsuccessful even in spite of shocking her 4.  She is asymptomatic with regard to atrial fibrillation, I have recommended rate control strategy only.  No change in the EKG.  She feels well and she has lost about 10 pounds in weight over the past 6 weeks.  Encouraged her to continue to lose weight and to remain physically active.  No clinical evidence of heart failure.  She has tenia infection underneath her left breast area, I have prescribed her ketoconazole.  I'll see her back in 6 months unless issues I'll see her back sooner. Continue anticoagulation for now.   Adrian Prows, MD, [facility] 06/02/2019, 6:46 AM [facility]. [place] Pager: [phone] Office: [phone] If no answer Cell [phone]

## 2019-06-04 NOTE — Progress Notes (Deleted)
Crystal Ashley is a 73 y.o. female is here for follow up.  History of Present Illness:   (SCRIBE ATTESTATION)  HPI:   Health Maintenance Due  Topic Date Due  . TETANUS/TDAP  01/22/1965  . Fecal DNA (Cologuard)  01/23/1996  . OPHTHALMOLOGY EXAM  04/07/2018   Depression screen [facility] 2/9 06/14/2018 05/05/2017 12/15/2016  Decreased Interest 0 0 0  Down, Depressed, Hopeless 0 0 0  PHQ - 2 Score 0 0 0   PMHx, SurgHx, SocialHx, FamHx, Medications, and Allergies were reviewed in the Visit Navigator and updated as appropriate.   Patient Active Problem List   Diagnosis Date Noted  . Acute on chronic combined systolic and diastolic CHF (congestive heart failure) ([place])   . Acute respiratory failure with hypoxia ([place]) 10/18/2018  . Declining mobility 09/23/2018  . B12 deficiency 09/23/2018  . Morbid obesity with BMI of 40.0-44.9, adult ([place]) 09/23/2018  . Anemia 09/23/2018  . History of cardioversion 04/26/2018  . Type 2 diabetes mellitus without complication, without long-term current use of insulin ([place]) 03/15/2018  . Atrial fibrillation with RVR ([place]) 03/15/2018  . Morbid obesity ([place]) 01/04/2017  . Hyperlipidemia associated with type 2 diabetes mellitus ([place]) 01/04/2017  . Vitamin D deficiency 12/15/2016  . Glaucoma   . Hypertension associated with diabetes ([place]) 04/19/2014  . Hypothyroidism 04/19/2014  . History of endometrial cancer 03/30/2014   Social History   Tobacco Use  . Smoking status: Never Smoker  . Smokeless tobacco: Never Used  Substance Use Topics  . Alcohol use: No  . Drug use: Not on file   Current Medications and Allergies   Current Outpatient Medications:  .  amLODipine (NORVASC) 10 MG tablet, Take 0.5 tablets (5 mg total) by mouth daily., Disp: , Rfl:  .  atorvastatin (LIPITOR) 10 MG tablet, Take 1 tablet (10 mg total) by mouth daily., Disp: 90 tablet, Rfl: 3 .  cholecalciferol (VITAMIN D) 1000 UNITS tablet, Take 5,000 Units by mouth See admin  instructions. Sun, Mon, Wed, friday, Disp: , Rfl:  .  cyanocobalamin (,VITAMIN B-12,) 1000 MCG/ML injection, 1000 mcg (1 mg) injection once per week for four weeks, followed by 1000 mcg injection once per month. (Patient taking differently: Inject 1,000 mcg into the muscle every 30 (thirty) days. ), Disp: 30 mL, Rfl: 3 .  dorzolamide-timolol (COSOPT) 22.3-6.8 MG/ML ophthalmic solution, Place 1 drop into both eyes 2 (two) times daily. , Disp: , Rfl:  .  ketoconazole (NIZORAL) 2 % cream, Apply 1 application topically daily., Disp: 15 g, Rfl: 3 .  levothyroxine (SYNTHROID) 75 MCG tablet, TAKE 1 TABLET EVERY DAY (Patient taking differently: Take 75 mcg by mouth daily before breakfast. ), Disp: 90 tablet, Rfl: 3 .  metFORMIN (GLUCOPHAGE) 1000 MG tablet, Take 1 tablet (1,000 mg total) by mouth 2 (two) times daily with a meal. (Patient taking differently: Take 1,000 mg by mouth daily with breakfast. ), Disp: 180 tablet, Rfl: 3 .  metoprolol succinate (TOPROL-XL) 100 MG 24 hr tablet, TAKE 1 TABLET DAILY. TAKE WITH OR IMMEDIATELY FOLLOWING A MEAL. (Patient taking differently: Take 100 mg by mouth daily. ), Disp: 90 tablet, Rfl: 0 .  Rivaroxaban (XARELTO) 15 MG TABS tablet, Take 1 tablet (15 mg total) by mouth daily with supper., Disp: 90 tablet, Rfl: 2 .  Syringe/Needle, Disp, (SYRINGE 3CC/25GX1") 25G X 1" 3 ML MISC, Use one a week for 4 weeks then once a month after., Disp: 12 each, Rfl: 1 .  travoprost, benzalkonium, (TRAVATAN) 0.004 %  ophthalmic solution, Place 1 drop into both eyes at bedtime. , Disp: , Rfl:  .  valsartan (DIOVAN) 320 MG tablet, Take 1 tablet (320 mg total) by mouth daily., Disp: 90 tablet, Rfl: 3  No Known Allergies Review of Systems   Pertinent items are noted in the HPI. Otherwise, a complete ROS is negative.  Vitals  There were no vitals filed for this visit.   There is no height or weight on file to calculate BMI.  Physical Exam   Physical Exam  Results for orders placed or  performed during the hospital encounter of 05/22/19  Glucose, capillary  Result Value Ref Range   Glucose-Capillary 132 (H) 70 - 99 mg/dL    Assessment and Plan   There are no diagnoses linked to this encounter.  . Orders and follow up as documented in [place], reviewed diet, exercise and weight control, cardiovascular risk and specific lipid/LDL goals reviewed, reviewed medications and side effects in detail.  . Reviewed expectations re: course of current medical issues. . Outlined signs and symptoms indicating need for more acute intervention. . Patient verbalized understanding and all questions were answered. . Patient received an After Visit Summary.  *** CMA served as Education administrator during this visit. History, Physical, and Plan performed by medical provider. The above documentation has been reviewed and is accurate and complete. Briscoe Deutscher, D.O.  Briscoe Deutscher, DO [place], [facility] 06/04/2019

## 2019-06-05 ENCOUNTER — Ambulatory Visit: Payer: Medicare Other | Admitting: Family Medicine

## 2019-06-05 ENCOUNTER — Encounter: Payer: Self-pay | Admitting: Family Medicine

## 2019-06-14 ENCOUNTER — Ambulatory Visit: Payer: Medicare Other

## 2019-07-24 ENCOUNTER — Other Ambulatory Visit: Payer: Self-pay | Admitting: Family Medicine

## 2019-07-24 DIAGNOSIS — I4891 Unspecified atrial fibrillation: Secondary | ICD-10-CM

## 2019-07-31 ENCOUNTER — Ambulatory Visit: Payer: Self-pay

## 2019-07-31 ENCOUNTER — Encounter: Payer: Medicare Other | Admitting: Family Medicine

## 2019-08-01 ENCOUNTER — Telehealth: Payer: Self-pay | Admitting: Family Medicine

## 2019-08-01 NOTE — Telephone Encounter (Signed)
Pt husband passed away and son will rsc toc with dr Retail banker. Pt needs  Amlodipine send to [employer] order

## 2019-08-01 NOTE — Telephone Encounter (Signed)
Yes thanks 

## 2019-08-01 NOTE — Telephone Encounter (Signed)
Please be advised.  °

## 2019-08-01 NOTE — Telephone Encounter (Signed)
Ok to give one refill until can be seen by you? Patient was getting from Cardiology in the past.

## 2019-08-02 ENCOUNTER — Other Ambulatory Visit: Payer: Self-pay

## 2019-08-02 DIAGNOSIS — E1159 Type 2 diabetes mellitus with other circulatory complications: Secondary | ICD-10-CM

## 2019-08-02 MED ORDER — AMLODIPINE BESYLATE 10 MG PO TABS: 5.0000 mg | ORAL_TABLET | Freq: Every day | ORAL | 0 refills | Status: DC

## 2019-08-02 NOTE — Telephone Encounter (Signed)
Rx sent in #90 w/0rfs

## 2019-10-09 DIAGNOSIS — H2512 Age-related nuclear cataract, left eye: Secondary | ICD-10-CM | POA: Diagnosis not present

## 2019-10-09 DIAGNOSIS — H401134 Primary open-angle glaucoma, bilateral, indeterminate stage: Secondary | ICD-10-CM | POA: Diagnosis not present

## 2019-10-09 LAB — HM DIABETES EYE EXAM

## 2019-10-16 ENCOUNTER — Ambulatory Visit: Payer: Medicare Other | Attending: Internal Medicine

## 2019-10-16 ENCOUNTER — Ambulatory Visit: Payer: Medicare Other | Admitting: Cardiology

## 2019-10-16 DIAGNOSIS — Z23 Encounter for immunization: Secondary | ICD-10-CM | POA: Insufficient documentation

## 2019-10-16 NOTE — Progress Notes (Signed)
   Covid-19 Vaccination Clinic  Name:  Crystal Ashley    MRN: [id number] DOB: 09-20-1945  10/16/2019  Crystal Ashley was observed post Covid-19 immunization for 15 minutes without incidence. She was provided with Vaccine Information Sheet and instruction to access the V-Safe system.   Crystal Ashley was instructed to call 911 with any severe reactions post vaccine: Marland Kitchen Difficulty breathing  . Swelling of your face and throat  . A fast heartbeat  . A bad rash all over your body  . Dizziness and weakness    Immunizations Administered    Name Date Dose VIS Date Route   Pfizer COVID-19 Vaccine 10/16/2019 11:03 AM 0.3 mL 07/28/2019 Intramuscular   Manufacturer: [place]   Lot: [id number]   [place]: [id number]

## 2019-11-14 ENCOUNTER — Ambulatory Visit: Payer: Medicare Other | Attending: Internal Medicine

## 2019-11-14 DIAGNOSIS — Z23 Encounter for immunization: Secondary | ICD-10-CM

## 2019-11-14 NOTE — Progress Notes (Signed)
   Covid-19 Vaccination Clinic  Name:  Crystal Ashley    MRN: [id number] DOB: [birth year]  11/14/2019  [name] was observed post Covid-19 immunization for 15 minutes without incident. She was provided with Vaccine Information Sheet and instruction to access the V-Safe system.   [name] was instructed to call 911 with any severe reactions post vaccine: Marland Kitchen Difficulty breathing  . Swelling of face and throat  . A fast heartbeat  . A bad rash all over body  . Dizziness and weakness   Immunizations Administered    Name Date Dose VIS Date Route   Pfizer COVID-19 Vaccine 11/14/2019 11:22 AM 0.3 mL 07/28/2019 Intramuscular   Manufacturer: [place]   Lot: [id number]   [place]: [id number]

## 2019-11-20 ENCOUNTER — Other Ambulatory Visit: Payer: Self-pay | Admitting: Family Medicine

## 2019-11-20 DIAGNOSIS — E119 Type 2 diabetes mellitus without complications: Secondary | ICD-10-CM

## 2019-11-20 DIAGNOSIS — I4891 Unspecified atrial fibrillation: Secondary | ICD-10-CM

## 2019-11-29 ENCOUNTER — Other Ambulatory Visit: Payer: Self-pay

## 2019-11-29 ENCOUNTER — Encounter: Payer: Self-pay | Admitting: Physician Assistant

## 2019-11-29 ENCOUNTER — Ambulatory Visit (INDEPENDENT_AMBULATORY_CARE_PROVIDER_SITE_OTHER): Payer: Medicare Other | Admitting: Physician Assistant

## 2019-11-29 VITALS — BP 110/60 | HR 102 | Temp 97.8°F | Ht 60.0 in | Wt 199.0 lb

## 2019-11-29 DIAGNOSIS — E538 Deficiency of other specified B group vitamins: Secondary | ICD-10-CM | POA: Diagnosis not present

## 2019-11-29 DIAGNOSIS — I10 Essential (primary) hypertension: Secondary | ICD-10-CM

## 2019-11-29 DIAGNOSIS — E1169 Type 2 diabetes mellitus with other specified complication: Secondary | ICD-10-CM

## 2019-11-29 DIAGNOSIS — E1159 Type 2 diabetes mellitus with other circulatory complications: Secondary | ICD-10-CM

## 2019-11-29 DIAGNOSIS — E559 Vitamin D deficiency, unspecified: Secondary | ICD-10-CM

## 2019-11-29 DIAGNOSIS — E039 Hypothyroidism, unspecified: Secondary | ICD-10-CM | POA: Diagnosis not present

## 2019-11-29 DIAGNOSIS — E785 Hyperlipidemia, unspecified: Secondary | ICD-10-CM | POA: Diagnosis not present

## 2019-11-29 DIAGNOSIS — E119 Type 2 diabetes mellitus without complications: Secondary | ICD-10-CM | POA: Diagnosis not present

## 2019-11-29 LAB — LIPID PANEL
Cholesterol: 129 mg/dL (ref 0–200)
HDL: 38 mg/dL — ABNORMAL LOW (ref >39.00)
LDL Cholesterol: 63 mg/dL (ref 0–99)
NonHDL: 91.44
Total CHOL/HDL Ratio: 3
Triglycerides: 142 mg/dL (ref 0.0–149.0)
VLDL: 28.4 mg/dL (ref 0.0–40.0)

## 2019-11-29 LAB — VITAMIN B12: Vitamin B-12: 710 pg/mL (ref 211–911)

## 2019-11-29 LAB — VITAMIN D 25 HYDROXY (VIT D DEFICIENCY, FRACTURES): VITD: 71.88 ng/mL (ref 30.00–100.00)

## 2019-11-29 LAB — TSH: TSH: 1.69 u[IU]/mL (ref 0.35–4.50)

## 2019-11-29 LAB — HEMOGLOBIN A1C: Hgb A1c MFr Bld: 6.8 % — ABNORMAL HIGH (ref 4.6–6.5)

## 2019-11-29 MED ORDER — VALSARTAN 160 MG PO TABS: 160.0000 mg | ORAL_TABLET | Freq: Every day | ORAL | 1 refills | Status: DC

## 2019-11-29 MED ORDER — METOPROLOL SUCCINATE ER 100 MG PO TB24: ORAL_TABLET | ORAL | 1 refills | Status: DC

## 2019-11-29 MED ORDER — AMLODIPINE BESYLATE 5 MG PO TABS: 5.0000 mg | ORAL_TABLET | Freq: Every day | ORAL | 1 refills | Status: DC

## 2019-11-29 MED ORDER — FUROSEMIDE 40 MG PO TABS: 40.0000 mg | ORAL_TABLET | Freq: Every day | ORAL | 1 refills | Status: DC

## 2019-11-29 NOTE — Patient Instructions (Addendum)
It was great to see you!  Call cardiology: Anytime you have any of the following symptoms: 1) 3 pound weight gain in 24 hours or 5 pounds in 1 week 2) shortness of breath, with or without a dry hacking cough 3) swelling in the hands, feet or stomach 4) if you have to sleep on extra pillows at night in order to breathe.   I will send in metformin, levothyroxine and lipitor AFTER I get your labs back.  Please let me know if you'd like to see the foot doctor.  We will contact you via MyChart with your lab results and next steps.  Let's follow-up in 6 months, sooner if you have concerns.  Take care,  [name] PA-C

## 2019-11-29 NOTE — Progress Notes (Signed)
Crystal Ashley is a 74 y.o. female is here for transfer of care.  I acted as a Education administrator for [employer], PA-C Anselmo Pickler, LPN  History of Present Illness:   Chief Complaint  Patient presents with  . Transfer of care  . Diabetes  . Hypertension    HPI  Pt is here today for transfer of care from Dr. Juleen China.  Diabetes Pt following up, currently taking Metformin 1000 mg BID. Denies symptoms of hypoglycemia, no neuropathy or change in vision.  Lab Results  Component Value Date   HGBA1C 6.9 (H) 01/17/2019   Hypertension Pt following up, does not check blood pressure at home. Currently Amlodipine 5 mg daily, Metoprolol 100 mg daily and Valsartan 160 mg daily. Also takes lasix 40 mg daily but does not take regular K+ supplementation. Pt denies headaches, dizziness, blurred vision, chest pain, SOB or lower leg edema. Denies excessive caffeine intake, stimulant usage, excessive alcohol intake or increase in salt consumption.  BP Readings from Last 3 Encounters:  11/29/19 110/60  06/01/19 124/81  05/22/19 (!) 91/41    HLD Takes lipitor 10 mg daily. Denies any myalgias with this medication.  Hypothyroidism Takes 75 mcg levothyroxine and tolerates well. Denies any concerns regarding her thyroid, such as unintentional weight changes or issues with temperature intolerance. Due for lab repeat.  Vit D and B12 deficiency Was received B12 injections from husband prior to him passing away. Will update labs today. Does take Vit D q other day.   Health Maintenance Due  Topic Date Due  . TETANUS/TDAP  Never done  . Fecal DNA (Cologuard)  Never done  . OPHTHALMOLOGY EXAM  04/07/2018  . FOOT EXAM  06/15/2019  . HEMOGLOBIN A1C  07/19/2019    Past Medical History:  Diagnosis Date  . Cancer ([place])   . Cataract   . Glaucoma   . Hypertension   . Thyroid disease      Social History   Socioeconomic History  . Marital status: Married    Spouse name: Not on file  .  Number of children: 0  . Years of education: Not on file  . Highest education level: Not on file  Occupational History  . Not on file  Tobacco Use  . Smoking status: Never Smoker  . Smokeless tobacco: Never Used  Substance and Sexual Activity  . Alcohol use: No  . Drug use: Not on file  . Sexual activity: Yes  Other Topics Concern  . 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 Difficulty of Paying Living Expenses:   Food Insecurity:   . Worried About Charity fundraiser in the Last Year:   . Arboriculturist in the Last Year:   Transportation Needs:   . Film/video editor (Medical):   Marland Kitchen Lack of Transportation (Non-Medical):   Physical Activity:   . Days of Exercise per Week:   . Minutes of Exercise per Session:   Stress:   . Feeling of Stress :   Social Connections:   . Frequency of Communication with Friends and Family:   . Frequency of Social Gatherings with Friends and Family:   . Attends Religious Services:   . Active Member of Clubs or Organizations:   . Attends Archivist Meetings:   Marland Kitchen Marital Status:   Intimate Partner Violence:   .  Fear of Current or Ex-Partner:   . Emotionally Abused:   Marland Kitchen Physically Abused:   . Sexually Abused:     Past Surgical History:  Procedure Laterality Date  . CARDIOVERSION N/A 04/08/2018   Procedure: CARDIOVERSION;  Surgeon: Adrian Prows, MD;  Location: [facility] ENDOSCOPY;  Service: Cardiovascular;  Laterality: N/A;  . CARDIOVERSION N/A 05/22/2019   Procedure: CARDIOVERSION;  Surgeon: Adrian Prows, MD;  Location: [place];  Service: Cardiovascular;  Laterality: N/A;  . FOOT SURGERY Left   . HYSTEROSCOPY     POLYPECTOMY    Family History  Problem Relation Age of Onset  . Diabetes Son   . Healthy Mother   . Healthy Father     PMHx, SurgHx, SocialHx, FamHx, Medications, and Allergies were reviewed in  the Visit Navigator and updated as appropriate.   Patient Active Problem List   Diagnosis Date Noted  . Acute on chronic combined systolic and diastolic CHF (congestive heart failure) ([place])   . Acute respiratory failure with hypoxia ([place]) 10/18/2018  . Declining mobility 09/23/2018  . B12 deficiency 09/23/2018  . Morbid obesity with BMI of 40.0-44.9, adult ([place]) 09/23/2018  . Anemia 09/23/2018  . History of cardioversion 04/26/2018  . Type 2 diabetes mellitus without complication, without long-term current use of insulin ([place]) 03/15/2018  . Atrial fibrillation with RVR ([place]) 03/15/2018  . Morbid obesity ([place]) 01/04/2017  . Hyperlipidemia associated with type 2 diabetes mellitus ([place]) 01/04/2017  . Vitamin D deficiency 12/15/2016  . Glaucoma   . Hypertension associated with diabetes ([place]) 04/19/2014  . Hypothyroidism 04/19/2014  . History of endometrial cancer 03/30/2014    Social History   Tobacco Use  . Smoking status: Never Smoker  . Smokeless tobacco: Never Used  Substance Use Topics  . Alcohol use: No  . Drug use: Not on file    Current Medications and Allergies:    Current Outpatient Medications:  .  atorvastatin (LIPITOR) 10 MG tablet, Take 1 tablet (10 mg total) by mouth daily., Disp: 90 tablet, Rfl: 3 .  cholecalciferol (VITAMIN D) 1000 UNITS tablet, Take 5,000 Units by mouth See admin instructions. Sun, Mon, Wed, friday, Disp: , Rfl:  .  cyanocobalamin (,VITAMIN B-12,) 1000 MCG/ML injection, 1000 mcg (1 mg) injection once per week for four weeks, followed by 1000 mcg injection once per month. (Patient taking differently: Inject 1,000 mcg into the muscle every 30 (thirty) days. ), Disp: 30 mL, Rfl: 3 .  dorzolamide-timolol (COSOPT) 22.3-6.8 MG/ML ophthalmic solution, Place 1 drop into both eyes 2 (two) times daily. , Disp: , Rfl:  .  furosemide (LASIX) 40 MG tablet, Take 1 tablet (40 mg total) by mouth daily., Disp: 90 tablet, Rfl: 1 .  ketoconazole (NIZORAL) 2 %  cream, Apply 1 application topically daily., Disp: 15 g, Rfl: 3 .  levothyroxine (SYNTHROID) 75 MCG tablet, TAKE 1 TABLET EVERY DAY (Patient taking differently: Take 75 mcg by mouth daily before breakfast. ), Disp: 90 tablet, Rfl: 3 .  metFORMIN (GLUCOPHAGE) 1000 MG tablet, Take 1 tablet (1,000 mg total) by mouth 2 (two) times daily with a meal. (Patient taking differently: Take 1,000 mg by mouth daily with breakfast. ), Disp: 180 tablet, Rfl: 3 .  metoprolol succinate (TOPROL-XL) 100 MG 24 hr tablet, Take with or immediately following a meal., Disp: 90 tablet, Rfl: 1 .  Rivaroxaban (XARELTO) 15 MG TABS tablet, Take 1 tablet (15 mg total) by mouth daily with supper., Disp: 90 tablet, Rfl: 2 .  Syringe/Needle, Disp, (SYRINGE 3CC/25GX1")  25G X 1" 3 ML MISC, Use one a week for 4 weeks then once a month after., Disp: 12 each, Rfl: 1 .  travoprost, benzalkonium, (TRAVATAN) 0.004 % ophthalmic solution, Place 1 drop into both eyes at bedtime. , Disp: , Rfl:  .  amLODipine (NORVASC) 5 MG tablet, Take 1 tablet (5 mg total) by mouth daily., Disp: 90 tablet, Rfl: 1 .  valsartan (DIOVAN) 160 MG tablet, Take 1 tablet (160 mg total) by mouth daily., Disp: 90 tablet, Rfl: 1  No Known Allergies  Review of Systems   ROS  Negative unless otherwise specified per HPI.  Vitals:   Vitals:   11/29/19 0918  BP: 110/60  Pulse: (!) 102  Temp: 97.8 F (36.6 C)  TempSrc: Temporal  SpO2: 97%  Weight: 199 lb (90.3 kg)  Height: 5' (1.524 m)     Body mass index is 38.86 kg/m.   Physical Exam:    Physical Exam Vitals and nursing note reviewed.  Constitutional:      General: She is not in acute distress.    Appearance: She is well-developed. She is not ill-appearing or toxic-appearing.  Cardiovascular:     Rate and Rhythm: Normal rate. Rhythm irregular.     Pulses: Normal pulses.     Heart sounds: Normal heart sounds, S1 normal and S2 normal.     Comments: No LE edema Pulmonary:     Effort: Pulmonary  effort is normal.     Breath sounds: Normal breath sounds.  Skin:    General: Skin is warm and dry.  Neurological:     Mental Status: She is alert.     GCS: GCS eye subscore is 4. GCS verbal subscore is 5. GCS motor subscore is 6.  Psychiatric:        Speech: Speech normal.        Behavior: Behavior normal. Behavior is cooperative.     Diabetic Foot Exam - Simple   Simple Foot Form Visual Inspection No deformities, no ulcerations, no other skin breakdown bilaterally: Yes Sensation Testing Intact to touch and monofilament testing bilaterally: Yes Pulse Check Posterior Tibialis and Dorsalis pulse intact bilaterally: Yes Comments       Assessment and Plan:    Crystal Ashley was seen today for transfer of care, diabetes and hypertension.  Diagnoses and all orders for this visit:  Hypertension associated with diabetes ([place]) Well controlled. Will refill medications today. She has scheduled follow-up with cardiology this summer. Discussed recommendation of weighing self regularly and to call cardiology if excessive weight gain (paramaters given on AVS.) -     metoprolol succinate (TOPROL-XL) 100 MG 24 hr tablet; Take with or immediately following a meal.  Type 2 diabetes mellitus without complication, without long-term current use of insulin ([place]) Update HgbA1c today. Foot exam completed today. Requesting record of recent eye exam. Will adjust DM regimen based upon A1c results. -     Hemoglobin A1c  Vitamin B 12 deficiency Update labs today and determine need for supplementation. -     Vitamin B12  Vitamin D deficiency Update labs today and determine need for supplementation. -     VITAMIN D 25 Hydroxy (Vit-D Deficiency, Fractures)  Acquired hypothyroidism Suspect euthyroid but will update labs today and determine need for change in rx. supplementation. -     TSH  Hyperlipidemia associated with type 2 diabetes mellitus ([place]) Update lipid panel today and determine need for change  in rx -     Lipid panel  Other  orders -     amLODipine (NORVASC) 5 MG tablet; Take 1 tablet (5 mg total) by mouth daily. -     valsartan (DIOVAN) 160 MG tablet; Take 1 tablet (160 mg total) by mouth daily. -     furosemide (LASIX) 40 MG tablet; Take 1 tablet (40 mg total) by mouth daily.  . Reviewed expectations re: course of current medical issues. . Discussed self-management of symptoms. . Outlined signs and symptoms indicating need for more acute intervention. . Patient verbalized understanding and all questions were answered. . See orders for this visit as documented in the electronic medical record. . Patient received an After Visit Summary.  CMA or LPN served as scribe during this visit. History, Physical, and Plan performed by medical provider. The above documentation has been reviewed and is accurate and complete.   [name], PA-C [place] [place] 11/29/2019  Follow-up: No follow-ups on file.

## 2019-11-30 ENCOUNTER — Ambulatory Visit: Payer: Medicare Other | Admitting: Cardiology

## 2019-12-01 ENCOUNTER — Other Ambulatory Visit: Payer: Self-pay | Admitting: *Deleted

## 2019-12-01 ENCOUNTER — Other Ambulatory Visit: Payer: Self-pay | Admitting: Physician Assistant

## 2019-12-01 DIAGNOSIS — I152 Hypertension secondary to endocrine disorders: Secondary | ICD-10-CM

## 2019-12-01 DIAGNOSIS — E7800 Pure hypercholesterolemia, unspecified: Secondary | ICD-10-CM

## 2019-12-01 DIAGNOSIS — E039 Hypothyroidism, unspecified: Secondary | ICD-10-CM

## 2019-12-01 DIAGNOSIS — E119 Type 2 diabetes mellitus without complications: Secondary | ICD-10-CM

## 2019-12-01 DIAGNOSIS — E1159 Type 2 diabetes mellitus with other circulatory complications: Secondary | ICD-10-CM

## 2019-12-01 MED ORDER — ATORVASTATIN CALCIUM 10 MG PO TABS: 10.0000 mg | ORAL_TABLET | Freq: Every day | ORAL | 3 refills | Status: DC

## 2019-12-01 MED ORDER — METOPROLOL SUCCINATE ER 100 MG PO TB24: 100.0000 mg | ORAL_TABLET | Freq: Every day | ORAL | 1 refills | Status: DC

## 2019-12-01 MED ORDER — LEVOTHYROXINE SODIUM 75 MCG PO TABS: 75.0000 ug | ORAL_TABLET | Freq: Every day | ORAL | 3 refills | Status: DC

## 2019-12-01 MED ORDER — METFORMIN HCL 1000 MG PO TABS: 1000.0000 mg | ORAL_TABLET | Freq: Every day | ORAL | 3 refills | Status: DC

## 2019-12-06 ENCOUNTER — Ambulatory Visit (INDEPENDENT_AMBULATORY_CARE_PROVIDER_SITE_OTHER): Payer: Medicare Other

## 2019-12-06 ENCOUNTER — Other Ambulatory Visit: Payer: Self-pay

## 2019-12-06 DIAGNOSIS — E538 Deficiency of other specified B group vitamins: Secondary | ICD-10-CM

## 2019-12-06 MED ORDER — CYANOCOBALAMIN 1000 MCG/ML IJ SOLN
1000.0000 ug | Freq: Once | INTRAMUSCULAR | Status: AC
  Administered 2019-12-06: 1000 ug via INTRAMUSCULAR

## 2019-12-06 NOTE — Progress Notes (Signed)
Per orders of [name], [place], injection of B12 given by Serita Sheller in left deltoid. Patient tolerated injection well. Patient will make appointment for 1 week.

## 2019-12-13 ENCOUNTER — Encounter: Payer: Medicare Other | Admitting: Physician Assistant

## 2019-12-14 ENCOUNTER — Telehealth: Payer: Self-pay | Admitting: Physician Assistant

## 2019-12-14 NOTE — Telephone Encounter (Signed)
Left message for patient to call back and schedule Medicare Annual Wellness Visit (AWV) either virtually/audio only OR in office. Whatever the patients preference is.  Last AWV 05/05/17; please schedule at anytime with [facility]-Nurse Health Advisor at [facility].

## 2019-12-18 ENCOUNTER — Encounter: Payer: Self-pay | Admitting: Physician Assistant

## 2020-01-09 ENCOUNTER — Other Ambulatory Visit: Payer: Self-pay

## 2020-01-09 ENCOUNTER — Ambulatory Visit (INDEPENDENT_AMBULATORY_CARE_PROVIDER_SITE_OTHER): Payer: Medicare Other

## 2020-01-09 DIAGNOSIS — E538 Deficiency of other specified B group vitamins: Secondary | ICD-10-CM

## 2020-01-09 MED ORDER — CYANOCOBALAMIN 1000 MCG/ML IJ SOLN
1000.0000 ug | Freq: Once | INTRAMUSCULAR | Status: AC
  Administered 2020-01-09: 1000 ug via INTRAMUSCULAR

## 2020-01-09 NOTE — Progress Notes (Signed)
Per orders of Samantha Worley, PA, injection of Vitamin B12 given by Amabel Stmarie D Rissa Turley in right deltoid. Patient tolerated injection well. Patient will make appointment for 1 month.   

## 2020-01-29 ENCOUNTER — Ambulatory Visit: Payer: Medicare Other | Admitting: Cardiology

## 2020-01-29 ENCOUNTER — Encounter: Payer: Self-pay | Admitting: Cardiology

## 2020-01-29 ENCOUNTER — Other Ambulatory Visit: Payer: Self-pay

## 2020-01-29 VITALS — BP 113/54 | HR 85 | Resp 16 | Ht 60.0 in | Wt 184.8 lb

## 2020-01-29 DIAGNOSIS — I4821 Permanent atrial fibrillation: Secondary | ICD-10-CM

## 2020-01-29 DIAGNOSIS — E1121 Type 2 diabetes mellitus with diabetic nephropathy: Secondary | ICD-10-CM | POA: Diagnosis not present

## 2020-01-29 DIAGNOSIS — E1122 Type 2 diabetes mellitus with diabetic chronic kidney disease: Secondary | ICD-10-CM

## 2020-01-29 DIAGNOSIS — I5032 Chronic diastolic (congestive) heart failure: Secondary | ICD-10-CM | POA: Diagnosis not present

## 2020-01-29 DIAGNOSIS — I10 Essential (primary) hypertension: Secondary | ICD-10-CM

## 2020-01-29 DIAGNOSIS — N1831 Chronic kidney disease, stage 3a: Secondary | ICD-10-CM | POA: Diagnosis not present

## 2020-01-29 MED ORDER — DILTIAZEM HCL ER COATED BEADS 180 MG PO CP24: 180.0000 mg | ORAL_CAPSULE | Freq: Every day | ORAL | 2 refills | Status: DC

## 2020-01-29 NOTE — Progress Notes (Signed)
Primary Physician/Referring:  [name], PA  Patient ID: Crystal Ashley, female    DOB: 07/09/1946, 74 y.o.   MRN: [id number]  Chief Complaint  Patient presents with  . Follow-up    6 month  . Atrial Fibrillation   HPI:    Crystal Ashley  is a 74 y.o. Asian [place] female with type 2 diabetes, hyperlipidemia, hypertension, hypothyroidism, history of endometrial cancer in 2015, h/o cardioversion on 04/08/2018 with improvement in fatigue and dyspnea, however during hospitalization for pneumonia on 10/18/2018, she was in persistent atrial fibrillation.  Repeat cardioversion on 05/22/2019 was unsuccessful.  She now presents for a 74-month office visit.   Rate control strategy only recommended in view of she being asymptomatic and no clinical evidence of heart failure.  She is significantly deconditioned and needs up at home, her son is present.  She denies chest pain, shortness of breath, leg edema, PND or orthopnea.  She is tolerating anticoagulation without any bleeding diathesis.  Past Medical History:  Diagnosis Date  . Cancer ([place])   . Cataract   . Glaucoma   . Hypertension   . Thyroid disease    Past Surgical History:  Procedure Laterality Date  . CARDIOVERSION N/A 04/08/2018   Procedure: CARDIOVERSION;  Surgeon: Adrian Prows, MD;  Location: [facility] ENDOSCOPY;  Service: Cardiovascular;  Laterality: N/A;  . CARDIOVERSION N/A 05/22/2019   Procedure: CARDIOVERSION;  Surgeon: Adrian Prows, MD;  Location: [place];  Service: Cardiovascular;  Laterality: N/A;  . FOOT SURGERY Left   . HYSTEROSCOPY     POLYPECTOMY   Social History   Tobacco Use  . Smoking status: Never Smoker  . Smokeless tobacco: Never Used  Substance Use Topics  . Alcohol use: No   Marital Status: Married   ROS  Review of Systems  Cardiovascular: Positive for dyspnea on exertion. Negative for chest pain and leg swelling.  Gastrointestinal: Negative for melena.   Objective    Vitals with BMI 01/29/2020  11/29/2019 06/01/2019  Height 5\' 0"  5\' 0"  5\' 0"   Weight 184 lbs 13 oz 199 lbs 184 lbs 14 oz  BMI 36.09 00.93 81.82  Systolic 993 716 967  Diastolic 54 60 81  Pulse 85 102 76    Physical Exam Constitutional:      Comments: Moderately built and moderately obese in no acute distress.  Neck:     Thyroid: No thyromegaly.     Vascular: No JVD.  Cardiovascular:     Rate and Rhythm: Tachycardia present. Rhythm irregularly irregular.     Pulses: Normal pulses and intact distal pulses.     Heart sounds: No murmur heard.  No gallop. No S3 or S4 sounds.      Comments: S1 is variable, S2 is normal.  No leg edema.  Pulmonary:     Effort: Pulmonary effort is normal.     Breath sounds: Normal breath sounds.  Abdominal:     General: Bowel sounds are normal.     Palpations: Abdomen is soft.  Musculoskeletal:        General: Normal range of motion.    Laboratory examination:   Recent Labs    05/18/19 1539  NA 141  K 4.2  CL 98  CO2 24  GLUCOSE 103*  BUN 15  CREATININE 1.36*  CALCIUM 9.7  GFRNONAA 39*  GFRAA 45*   CMP Latest Ref Rng & Units 05/18/2019 01/17/2019 10/21/2018  Glucose 65 - 99 mg/dL 103(H) 88 124(H)  BUN 8 - 27 mg/dL  15 25(H) 25(H)  Creatinine 0.57 - 1.00 mg/dL 1.36(H) 1.26(H) 0.97  Sodium 134 - 144 mmol/L 141 138 138  Potassium 3.5 - 5.2 mmol/L 4.2 4.8 4.5  Chloride 96 - 106 mmol/L 98 100 104  CO2 20 - 29 mmol/L 24 28 28   Calcium 8.7 - 10.3 mg/dL 9.7 9.4 8.4(L)  Total Protein 6.0 - 8.3 g/dL - 6.8 -  Total Bilirubin 0.2 - 1.2 mg/dL - 0.5 -  Alkaline Phos 39 - 117 U/L - 81 -  AST 0 - 37 U/L - 9 -  ALT 0 - 35 U/L - 7 -   CBC Latest Ref Rng & Units 01/17/2019 10/19/2018 10/18/2018  WBC 4.0 - 10.5 K/uL 10.8(H) 11.9(H) 15.4(H)  Hemoglobin 12.0 - 15.0 g/dL 12.3 12.2 10.4(L)  Hematocrit 36 - 46 % 36.6 40.3 34.8(L)  Platelets 150 - 400 K/uL 318.0 285 319   Lipid Panel     Component Value Date/Time   CHOL 129 11/29/2019 0944   TRIG 142.0 11/29/2019 0944   HDL 38.00 (L)  11/29/2019 0944   CHOLHDL 3 11/29/2019 0944   VLDL 28.4 11/29/2019 0944   LDLCALC 63 11/29/2019 0944   HEMOGLOBIN A1C Lab Results  Component Value Date   HGBA1C 6.8 (H) 11/29/2019   TSH Recent Labs    11/29/19 0944  TSH 1.69   Medications    Current Outpatient Medications  Medication Instructions  . atorvastatin (LIPITOR) 10 mg, Oral, Daily  . cholecalciferol (VITAMIN D) 5,000 Units, Oral, See admin instructions, Sun, Mon, Wed, friday  . cyanocobalamin (,VITAMIN B-12,) 1000 MCG/ML injection 1000 mcg (1 mg) injection once per week for four weeks, followed by 1000 mcg injection once per month.  . diltiazem (CARDIZEM CD) 180 mg, Oral, Daily  . dorzolamide-timolol (COSOPT) 22.3-6.8 MG/ML ophthalmic solution 1 drop, Both Eyes, 2 times daily  . furosemide (LASIX) 40 mg, Oral, Daily  . levothyroxine (SYNTHROID) 75 mcg, Oral, Daily before breakfast  . metFORMIN (GLUCOPHAGE) 1,000 mg, Oral, Daily with breakfast  . metoprolol succinate (TOPROL-XL) 100 mg, Oral, Daily, Take with or immediately following a meal.  . Rivaroxaban (XARELTO) 15 mg, Oral, Daily with supper  . travoprost, benzalkonium, (TRAVATAN) 0.004 % ophthalmic solution 1 drop, Both Eyes, Daily at bedtime  . valsartan (DIOVAN) 160 mg, Oral, Daily    Cardiac Studies:   Lexiscan myoview stress test 04/01/2018: 1. Lexiscan stress test was performed. Exercise capacity was not assessed. No stress symptoms reported. Normal blood pressure. The resting and stress electrocardiogram demonstrated atrial fibrillation with rapid ventricular rate, low voltage, and normal rest repolarization. Stress EKG is non diagnostic for ischemia as it is a pharmacologic stress. 2. The overall quality of the study is fair. Review of the raw data in a rotational cine format reveals breast attenuation, with imaging performed in sitting position. Gated SPECT images reveal normal myocardial thickening and wall motion. The left ventricular ejection  fraction was calculated or visually estimated to be 47%. REST and STRESS images demonstrate decreased tracer uptake in the mid inferoseptal, mid inferior, apical septal and apical inferior segments of the left ventricle, worse on rest images. While defect likely represents breast attenuation, ischemia in this region cannot be excluded. Clinical correlation recommended. 3. Low to intermediate risk study.  Echocardiogram 10/19/2018 :   1. The left ventricle has normal systolic function, with an ejection fraction of 55-60%. The cavity size was normal. Left ventricular diastolic Doppler parameters are indeterminate No evidence of left ventricular regional wall motion abnormalities.  2.  Left atrial size was moderately dilated.  3. The mitral valve is degenerative. Mild thickening of the mitral valve leaflet. Mild calcification of the mitral valve leaflet.  4. The tricuspid valve is normal in structure.  5. The aortic valve is tricuspid Mild calcification of the aortic valve.  6. Pulmonary hypertension is mild. PA pressure 40 mm Hg (CVP 15)  EKG:  EKG 01/29/2020: Atrial fibrillation with rapid ventricular response at rate of 111 bpm, normal axis, nonspecific T wave abnormality.  Low-voltage complexes.  Compared to 05/31/2019, rapid ventricular response is new.  Assessment     ICD-10-CM   1. Permanent atrial fibrillation ([place]). CHA2DS2-VASc Score is 6.  Yearly risk of stroke: 9.8% (A, F, HTN, DM, CHF).   I48.21 EKG 12-Lead    diltiazem (CARDIZEM CD) 180 MG 24 hr capsule  2. Chronic diastolic (congestive) heart failure (HCC)  I50.32   3. Essential hypertension  A12 COMPLETE METABOLIC PANEL WITH GFR  4. Type 2 diabetes mellitus with stage 3a chronic kidney disease, without long-term current use of insulin (HCC)  I78.67 COMPLETE METABOLIC PANEL WITH GFR   N18.31          Recommendations:   Crystal Ashley  is a 74 y.o. Asian [place] female with type 2 diabetes, hyperlipidemia, hypertension,  hypothyroidism, history of endometrial cancer in 2015, h/o cardioversion on 04/08/2018 with improvement in fatigue and dyspnea, however during hospitalization for pneumonia on 10/18/2018, she was in persistent atrial fibrillation.  Repeat cardioversion on 05/22/2019 was unsuccessful and as she has remained asymptomatic and there was no clinical evidence of heart failure, we will pursue rate control strategy only.  She is presently doing well but is significantly deconditioned having difficulty even getting up to the lamination table.  I encouraged her to use walker and try to walk on a regular basis.  Would consider outpatient physical therapy.  From cardiac standpoint, blood pressure is well controlled, atrial fibrillation is rate uncontrolled.  I would like to discontinue amlodipine and switch her to diltiazem CD 100 mg daily, I would like to see her back in 6 weeks for follow-up of atrial fibrillation with repeat EKG.  I reviewed her labs, renal function has remained stable.  Lipids are normal and diabetes is controlled.  She needs repeat CMP.  Adrian Prows, MD, [facility] 01/29/2020, 4:37 PM [facility] [place] Pager: [phone] Office: [phone] If no answer Cell [phone]

## 2020-01-31 ENCOUNTER — Emergency Department (HOSPITAL_COMMUNITY): Payer: Medicare Other

## 2020-01-31 ENCOUNTER — Telehealth: Payer: Self-pay

## 2020-01-31 ENCOUNTER — Emergency Department (HOSPITAL_COMMUNITY)
Admission: EM | Admit: 2020-01-31 | Discharge: 2020-01-31 | Disposition: A | Discharge status: Home / Self Care | Payer: Medicare Other | Attending: Emergency Medicine | Admitting: Emergency Medicine

## 2020-01-31 ENCOUNTER — Ambulatory Visit: Payer: Medicare Other | Admitting: Cardiology

## 2020-01-31 ENCOUNTER — Encounter (HOSPITAL_COMMUNITY): Payer: Self-pay

## 2020-01-31 DIAGNOSIS — E039 Hypothyroidism, unspecified: Secondary | ICD-10-CM | POA: Insufficient documentation

## 2020-01-31 DIAGNOSIS — I4811 Longstanding persistent atrial fibrillation: Secondary | ICD-10-CM | POA: Diagnosis not present

## 2020-01-31 DIAGNOSIS — Y9301 Activity, walking, marching and hiking: Secondary | ICD-10-CM | POA: Insufficient documentation

## 2020-01-31 DIAGNOSIS — Y999 Unspecified external cause status: Secondary | ICD-10-CM | POA: Insufficient documentation

## 2020-01-31 DIAGNOSIS — Z7984 Long term (current) use of oral hypoglycemic drugs: Secondary | ICD-10-CM | POA: Diagnosis not present

## 2020-01-31 DIAGNOSIS — I4891 Unspecified atrial fibrillation: Secondary | ICD-10-CM | POA: Diagnosis not present

## 2020-01-31 DIAGNOSIS — Y929 Unspecified place or not applicable: Secondary | ICD-10-CM | POA: Diagnosis not present

## 2020-01-31 DIAGNOSIS — M546 Pain in thoracic spine: Secondary | ICD-10-CM | POA: Diagnosis not present

## 2020-01-31 DIAGNOSIS — R29898 Other symptoms and signs involving the musculoskeletal system: Secondary | ICD-10-CM | POA: Diagnosis not present

## 2020-01-31 DIAGNOSIS — R519 Headache, unspecified: Secondary | ICD-10-CM | POA: Diagnosis not present

## 2020-01-31 DIAGNOSIS — W1830XA Fall on same level, unspecified, initial encounter: Secondary | ICD-10-CM | POA: Diagnosis not present

## 2020-01-31 DIAGNOSIS — M5489 Other dorsalgia: Secondary | ICD-10-CM | POA: Diagnosis not present

## 2020-01-31 DIAGNOSIS — I9589 Other hypotension: Secondary | ICD-10-CM | POA: Diagnosis not present

## 2020-01-31 DIAGNOSIS — R531 Weakness: Secondary | ICD-10-CM | POA: Diagnosis not present

## 2020-01-31 DIAGNOSIS — M545 Low back pain: Secondary | ICD-10-CM | POA: Diagnosis not present

## 2020-01-31 DIAGNOSIS — I10 Essential (primary) hypertension: Secondary | ICD-10-CM | POA: Insufficient documentation

## 2020-01-31 DIAGNOSIS — Z79899 Other long term (current) drug therapy: Secondary | ICD-10-CM | POA: Diagnosis not present

## 2020-01-31 DIAGNOSIS — I4821 Permanent atrial fibrillation: Secondary | ICD-10-CM | POA: Diagnosis not present

## 2020-01-31 DIAGNOSIS — E119 Type 2 diabetes mellitus without complications: Secondary | ICD-10-CM | POA: Diagnosis not present

## 2020-01-31 DIAGNOSIS — Z7901 Long term (current) use of anticoagulants: Secondary | ICD-10-CM | POA: Diagnosis not present

## 2020-01-31 DIAGNOSIS — R0902 Hypoxemia: Secondary | ICD-10-CM | POA: Diagnosis not present

## 2020-01-31 DIAGNOSIS — E1165 Type 2 diabetes mellitus with hyperglycemia: Secondary | ICD-10-CM | POA: Diagnosis not present

## 2020-01-31 DIAGNOSIS — E861 Hypovolemia: Secondary | ICD-10-CM | POA: Diagnosis not present

## 2020-01-31 DIAGNOSIS — R55 Syncope and collapse: Secondary | ICD-10-CM | POA: Diagnosis not present

## 2020-01-31 DIAGNOSIS — W19XXXA Unspecified fall, initial encounter: Secondary | ICD-10-CM

## 2020-01-31 DIAGNOSIS — R42 Dizziness and giddiness: Secondary | ICD-10-CM | POA: Diagnosis not present

## 2020-01-31 DIAGNOSIS — R52 Pain, unspecified: Secondary | ICD-10-CM | POA: Diagnosis not present

## 2020-01-31 LAB — CBC
HCT: 37.2 % (ref 36.0–46.0)
Hemoglobin: 10.8 g/dL — ABNORMAL LOW (ref 12.0–15.0)
MCH: 26.7 pg (ref 26.0–34.0)
MCHC: 29 g/dL — ABNORMAL LOW (ref 30.0–36.0)
MCV: 92.1 fL (ref 80.0–100.0)
Platelets: 275 10*3/uL (ref 150–400)
RBC: 4.04 MIL/uL (ref 3.87–5.11)
RDW: 15.9 % — ABNORMAL HIGH (ref 11.5–15.5)
WBC: 15.9 10*3/uL — ABNORMAL HIGH (ref 4.0–10.5)
nRBC: 0 % (ref 0.0–0.2)

## 2020-01-31 LAB — RAPID URINE DRUG SCREEN, HOSP PERFORMED
Amphetamines: NOT DETECTED
Barbiturates: NOT DETECTED
Benzodiazepines: NOT DETECTED
Cocaine: NOT DETECTED
Opiates: NOT DETECTED
Tetrahydrocannabinol: NOT DETECTED

## 2020-01-31 LAB — TSH: TSH: 1.231 u[IU]/mL (ref 0.350–4.500)

## 2020-01-31 LAB — COMPREHENSIVE METABOLIC PANEL
ALT: 11 U/L (ref 0–44)
AST: 22 U/L (ref 15–41)
Albumin: 3.3 g/dL — ABNORMAL LOW (ref 3.5–5.0)
Alkaline Phosphatase: 80 U/L (ref 38–126)
Anion gap: 12 (ref 5–15)
BUN: 21 mg/dL (ref 8–23)
CO2: 24 mmol/L (ref 22–32)
Calcium: 8.7 mg/dL — ABNORMAL LOW (ref 8.9–10.3)
Chloride: 100 mmol/L (ref 98–111)
Creatinine, Ser: 1.1 mg/dL — ABNORMAL HIGH (ref 0.44–1.00)
GFR calc Af Amer: 57 mL/min — ABNORMAL LOW (ref >60)
GFR calc non Af Amer: 49 mL/min — ABNORMAL LOW (ref >60)
Glucose, Bld: 211 mg/dL — ABNORMAL HIGH (ref 70–99)
Potassium: 4.2 mmol/L (ref 3.5–5.1)
Sodium: 136 mmol/L (ref 135–145)
Total Bilirubin: 0.9 mg/dL (ref 0.3–1.2)
Total Protein: 6.6 g/dL (ref 6.5–8.1)

## 2020-01-31 LAB — URINALYSIS, ROUTINE W REFLEX MICROSCOPIC
Bilirubin Urine: NEGATIVE
Glucose, UA: NEGATIVE mg/dL
Hgb urine dipstick: NEGATIVE
Ketones, ur: NEGATIVE mg/dL
Leukocytes,Ua: NEGATIVE
Nitrite: NEGATIVE
Protein, ur: NEGATIVE mg/dL
Specific Gravity, Urine: 1.009 (ref 1.005–1.030)
pH: 5 (ref 5.0–8.0)

## 2020-01-31 IMAGING — CT CT HEAD W/O CM
3 series · 15 of 47 positions shown, 18 images · non-contrast
Comparison: None.

CLINICAL DATA: Near syncope

EXAM:
CT HEAD WITHOUT CONTRAST
TECHNIQUE: Contiguous axial images were obtained from the base of the skull
through the vertex without intravenous contrast.

[Series 2: head 5.0 h30s · axial · 0.43mm/px · z∈[-196,-71]mm · 9 of 30 slices shown, 12 images]
[im 3/30  brain]
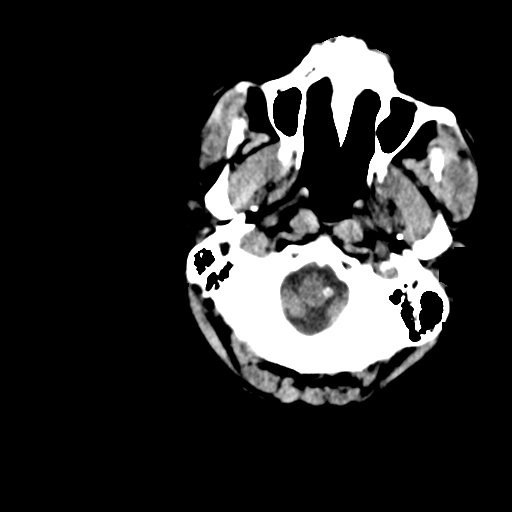
[im 3/30  bone]
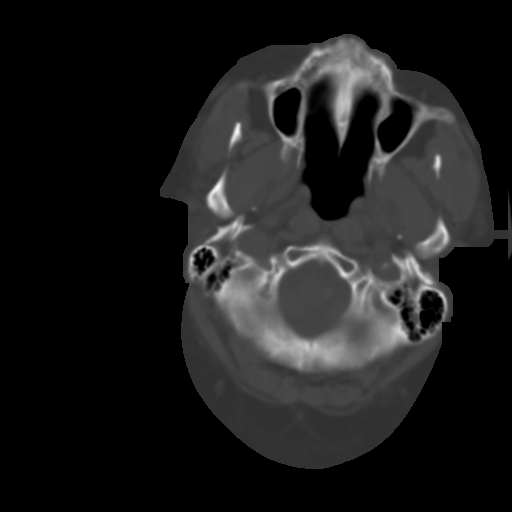
[im 6/30  brain]
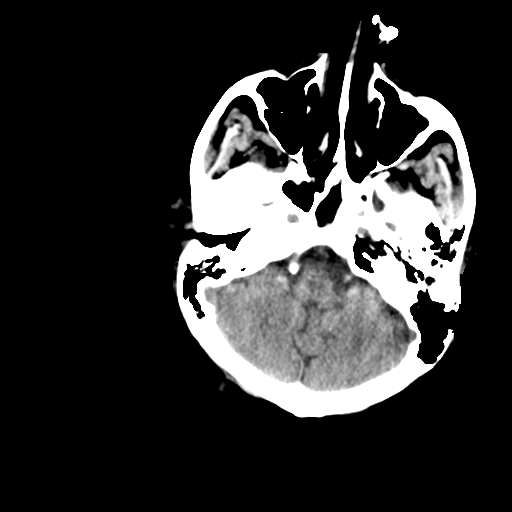
[im 9/30  brain]
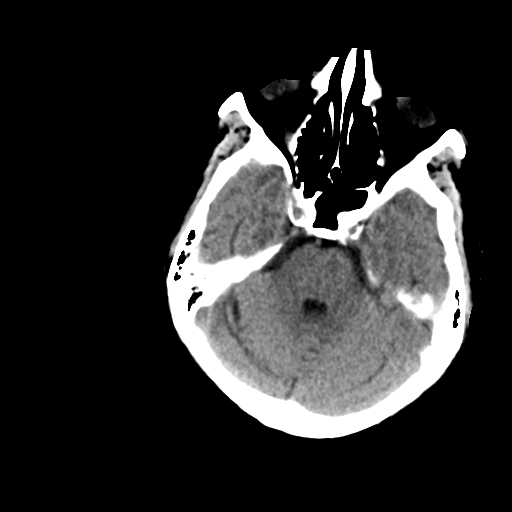
[im 12/30  brain]
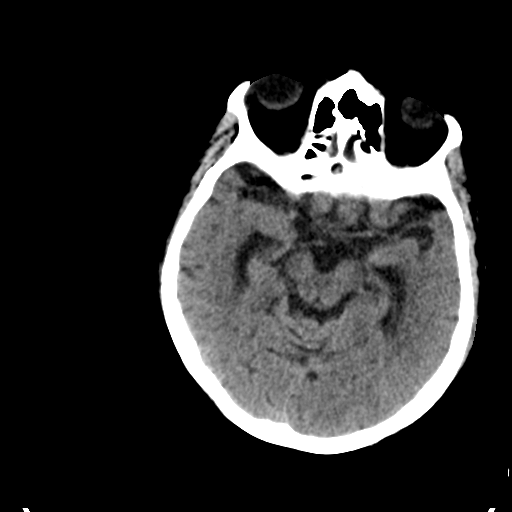
[im 16/30  brain]
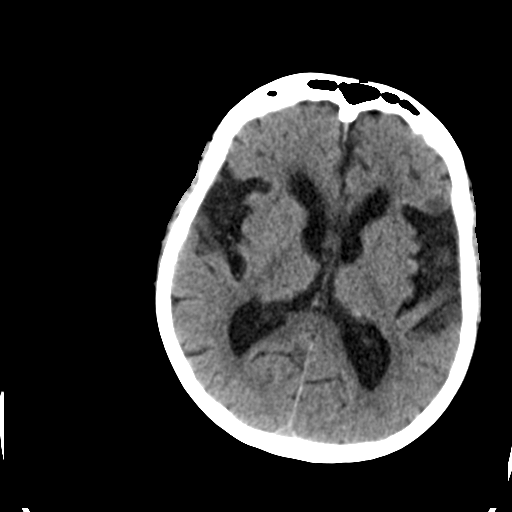
[im 16/30  bone]
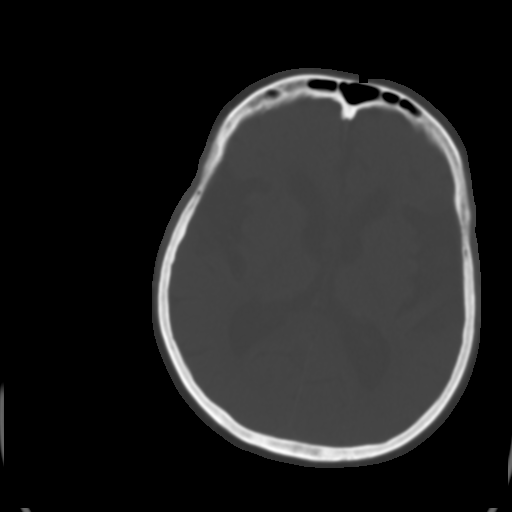
[im 19/30  brain]
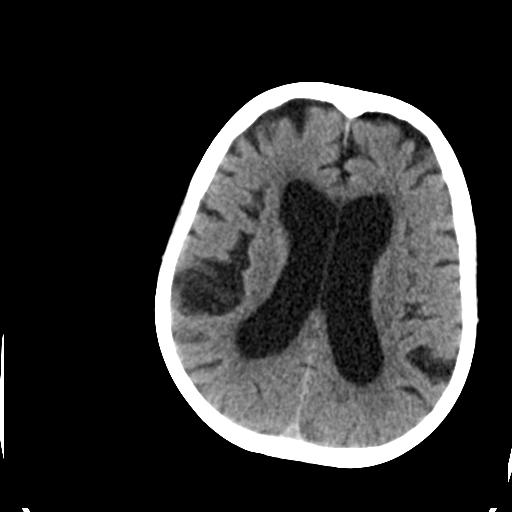
[im 22/30  brain]
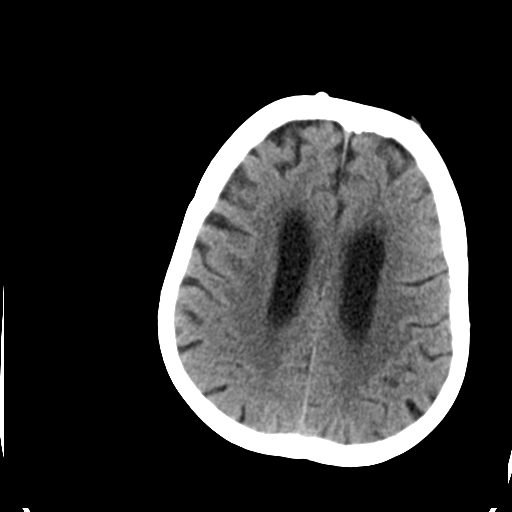
[im 25/30  brain]
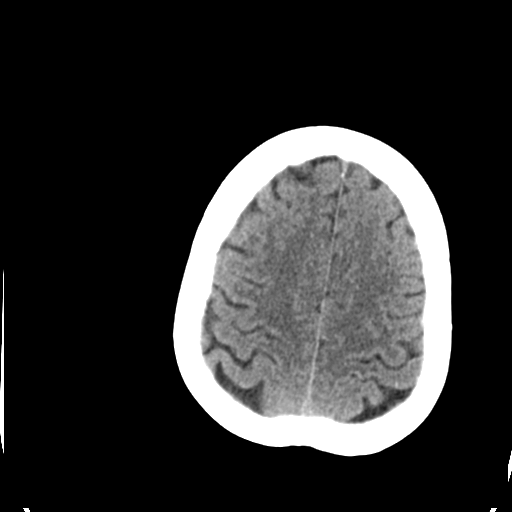
[im 28/30  brain]
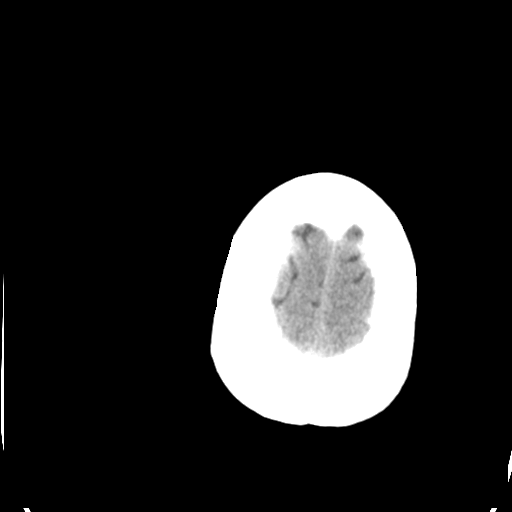
[im 28/30  bone]
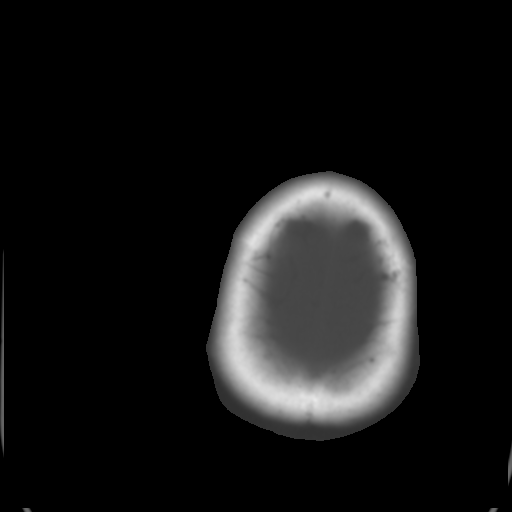

[Series 4: head 3.0 mpr cor · coronal · 0.29mm/px · 3 of 67 slices shown]
[im 23/67  brain]
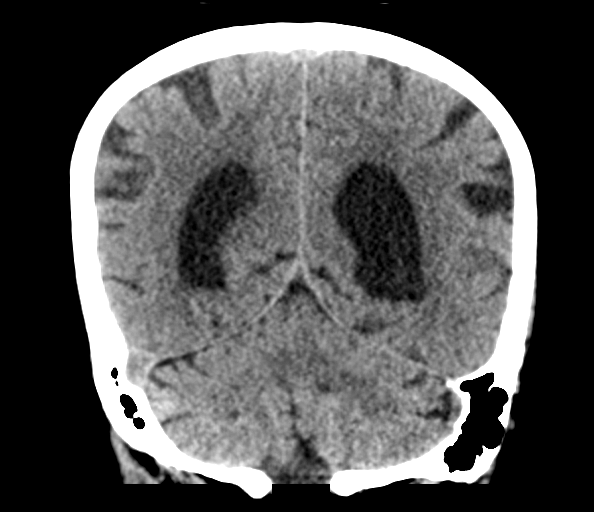
[im 30/67  brain]
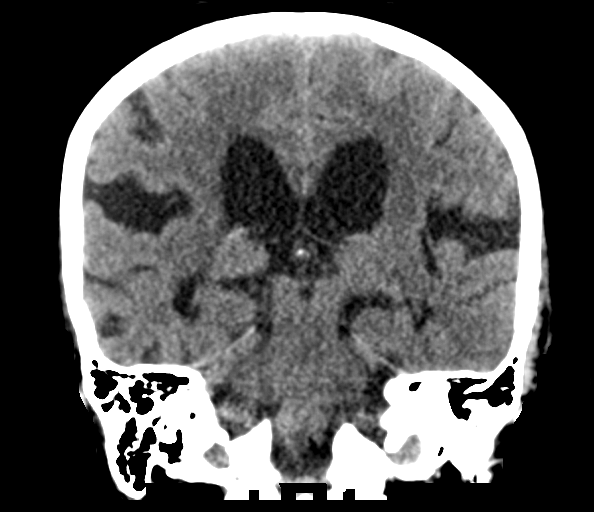
[im 37/67  brain]
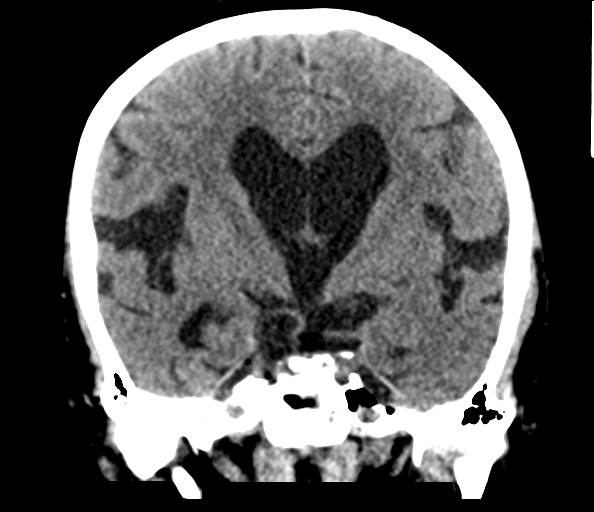

[Series 5: head 3.0 mpr sag · sagittal · 0.29mm/px · 3 of 56 slices shown]
[im 19/56  brain]
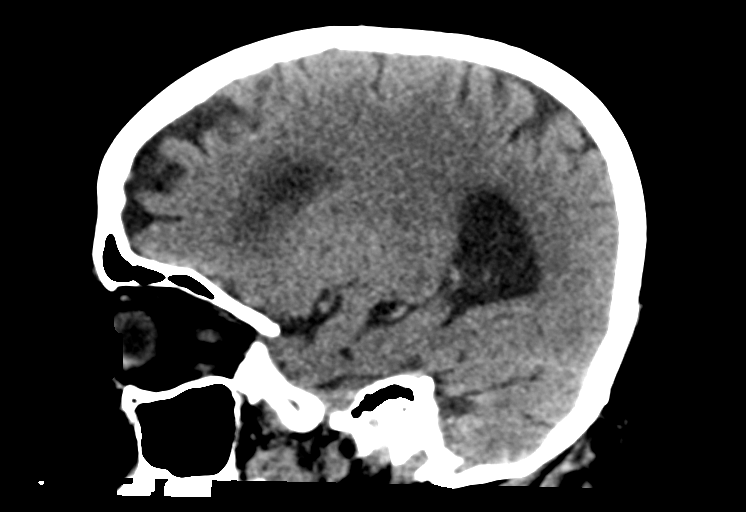
[im 28/56  brain]
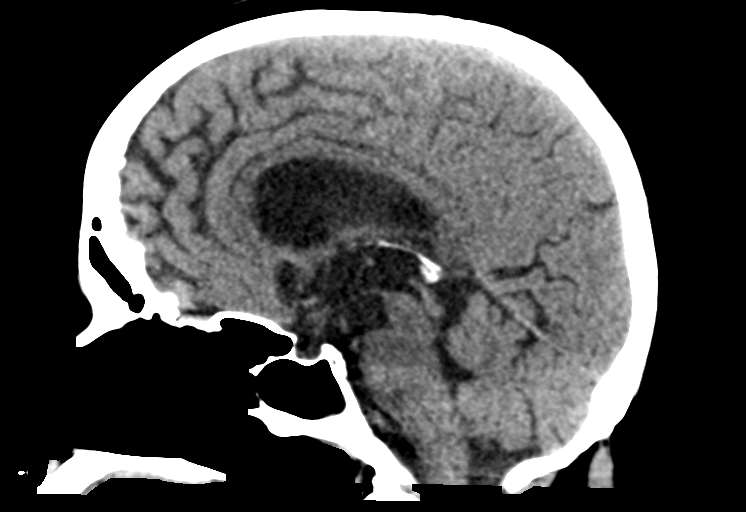
[im 37/56  brain]
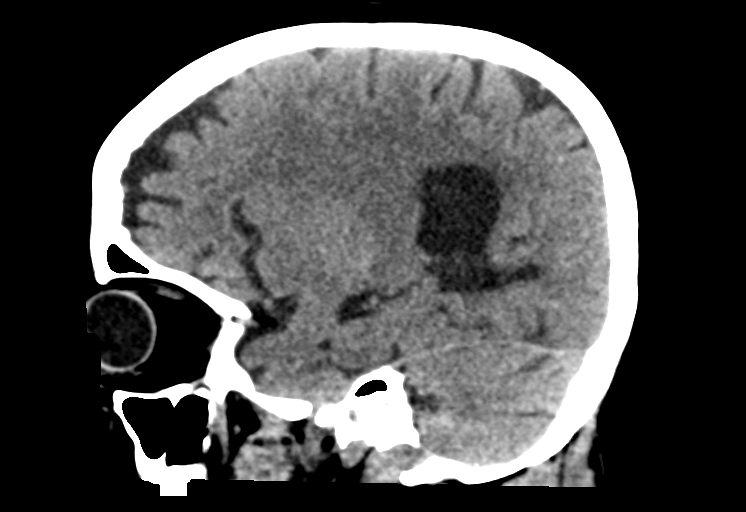

[15 of 47 positions shown; findings below may reference images not displayed]

FINDINGS: Brain: No acute territorial infarction, hemorrhage or intracranial
mass. Moderate atrophy. Mild hypodensity in the white matter
consistent with chronic small vessel ischemic change. Slightly
enlarged ventricles presumably due to atrophy.

Vascular: No hyperdense vessels.  Carotid vascular calcification

Skull: Normal. Negative for fracture or focal lesion.

Sinuses/Orbits: Mucosal thickening in the sphenoid sinuses.

Other: None
IMPRESSION: 1. No CT evidence for acute intracranial abnormality.
2. Atrophy and mild chronic small vessel ischemic changes of the
white matter.

## 2020-01-31 IMAGING — DX DG THORACIC SPINE 2V
2 series · 2 of 2 positions shown · non-contrast
Comparison: [DATE]

CLINICAL DATA: Back pain after fall

EXAM:
THORACIC SPINE 2 VIEWS; LUMBAR SPINE - COMPLETE 4+ VIEW

[t-spine lat]
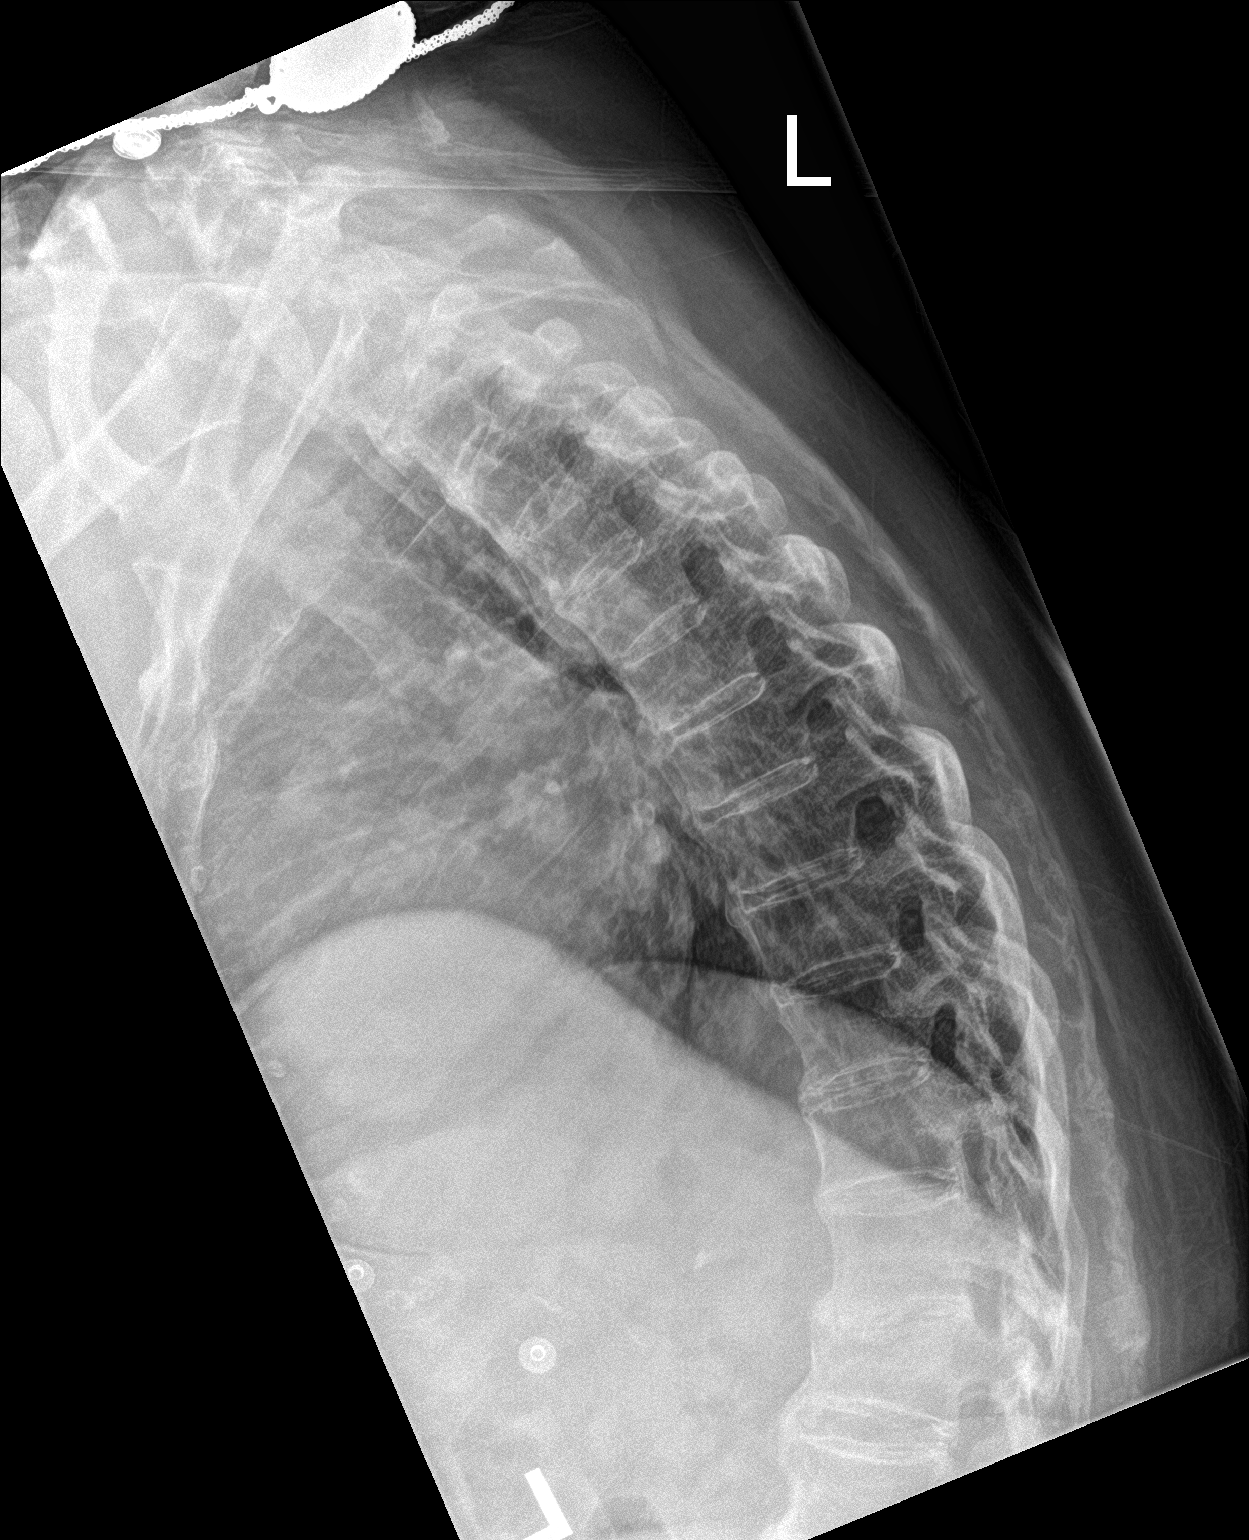

[t-spine ap]
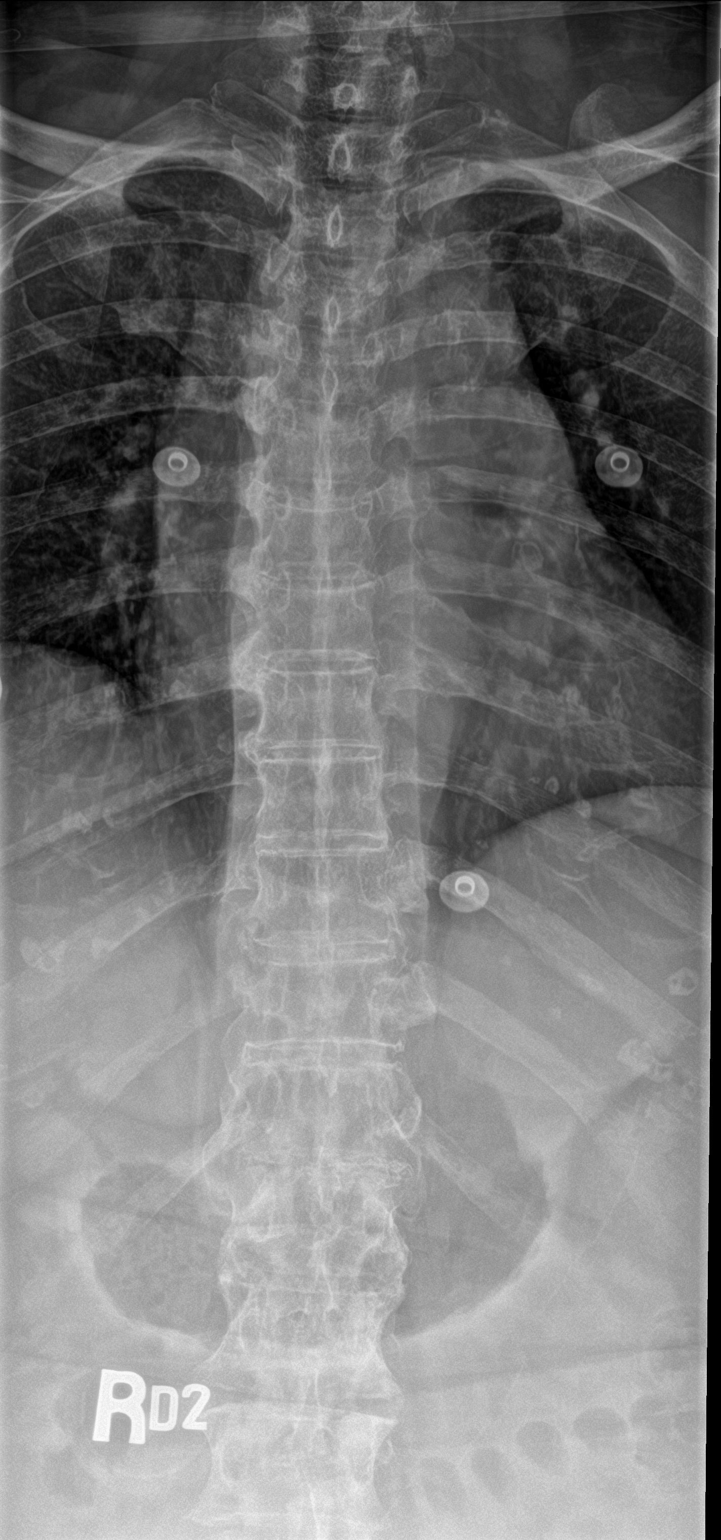

[2 of 2 positions shown; findings below may reference images not displayed]

FINDINGS: Mild superior endplate compression deformity at L1 is unchanged from
lateral chest radiograph [DATE]. Remaining vertebral body
heights are maintained. No acute fracture identified. No static
listhesis. Bridging anterior endplate osteophytosis within the
lumbar spine. Lower lumbar facet arthrosis. Thoracic intervertebral
disc spaces are preserved.
IMPRESSION: 1. No evidence of acute fracture or static listhesis of the thoracic
or lumbar spine.
2. Unchanged mild L1 superior endplate compression deformity.

## 2020-01-31 IMAGING — DX DG LUMBAR SPINE COMPLETE 4+V
5 series · 5 of 5 positions shown · non-contrast
Comparison: [DATE]

CLINICAL DATA: Back pain after fall

EXAM:
THORACIC SPINE 2 VIEWS; LUMBAR SPINE - COMPLETE 4+ VIEW

[l-spine ap]
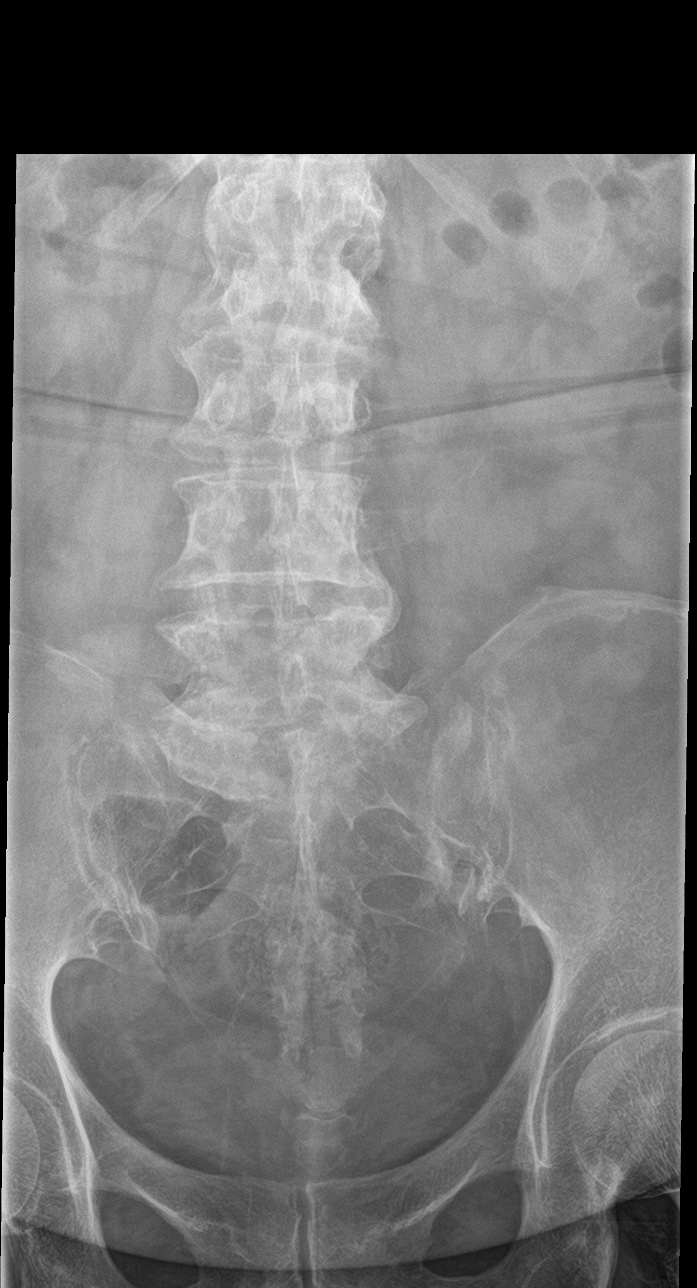

[l-spine obl (1 of 2)]
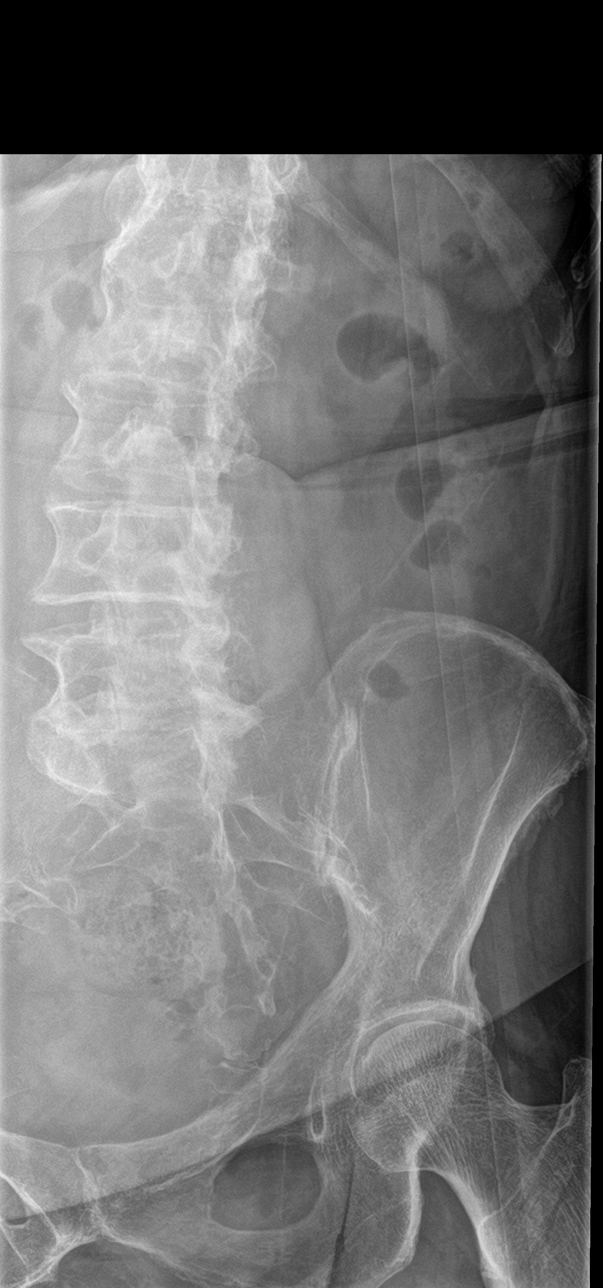

[l-spine obl (2 of 2)]
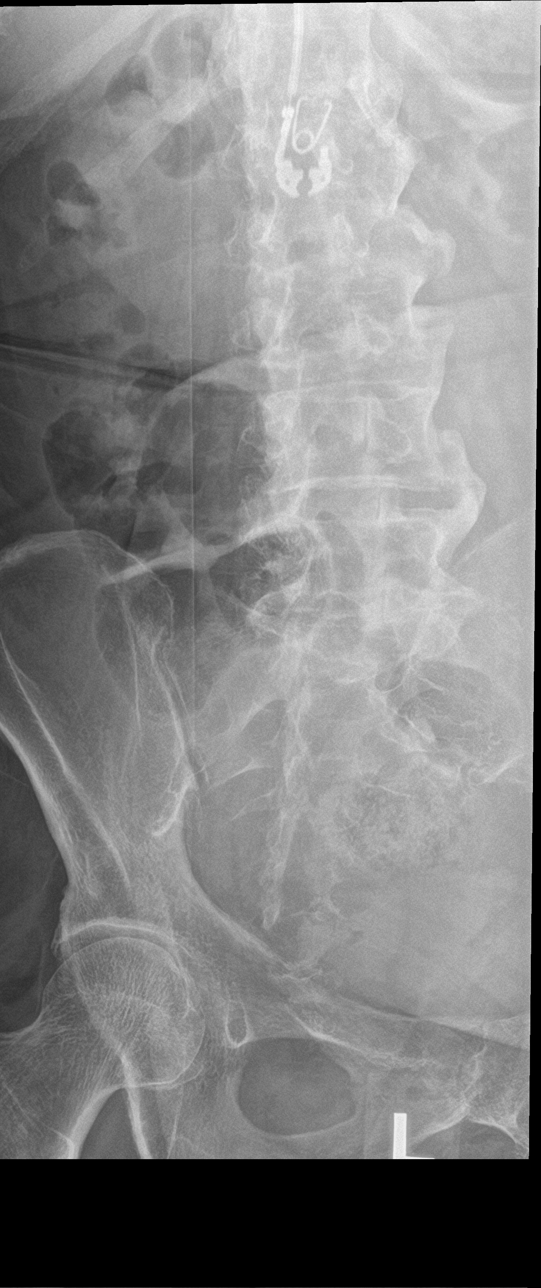

[l-spine lat]
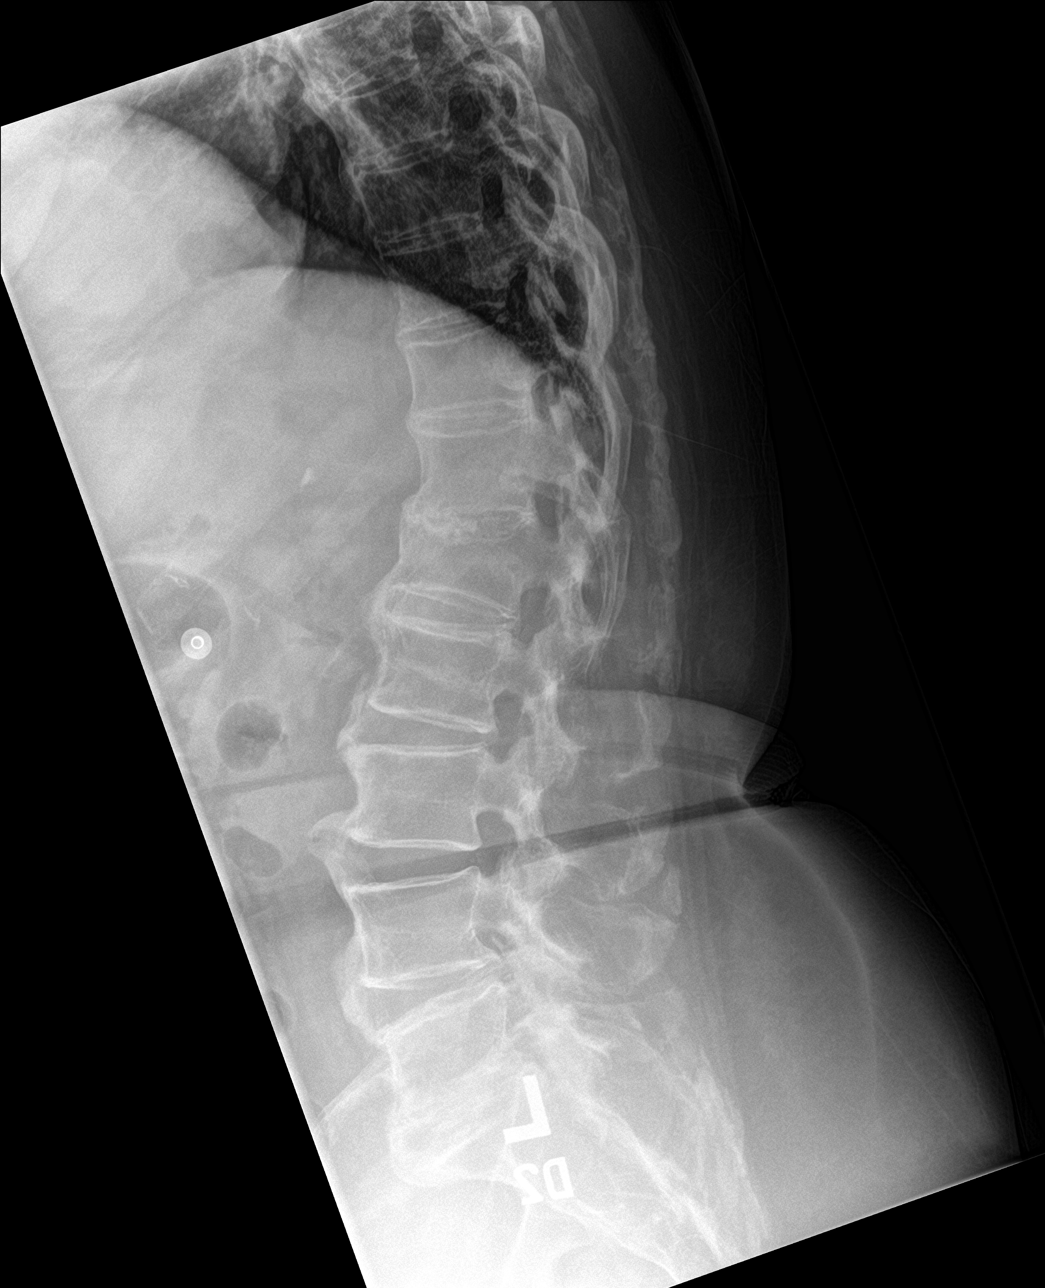

[l-spine spot]
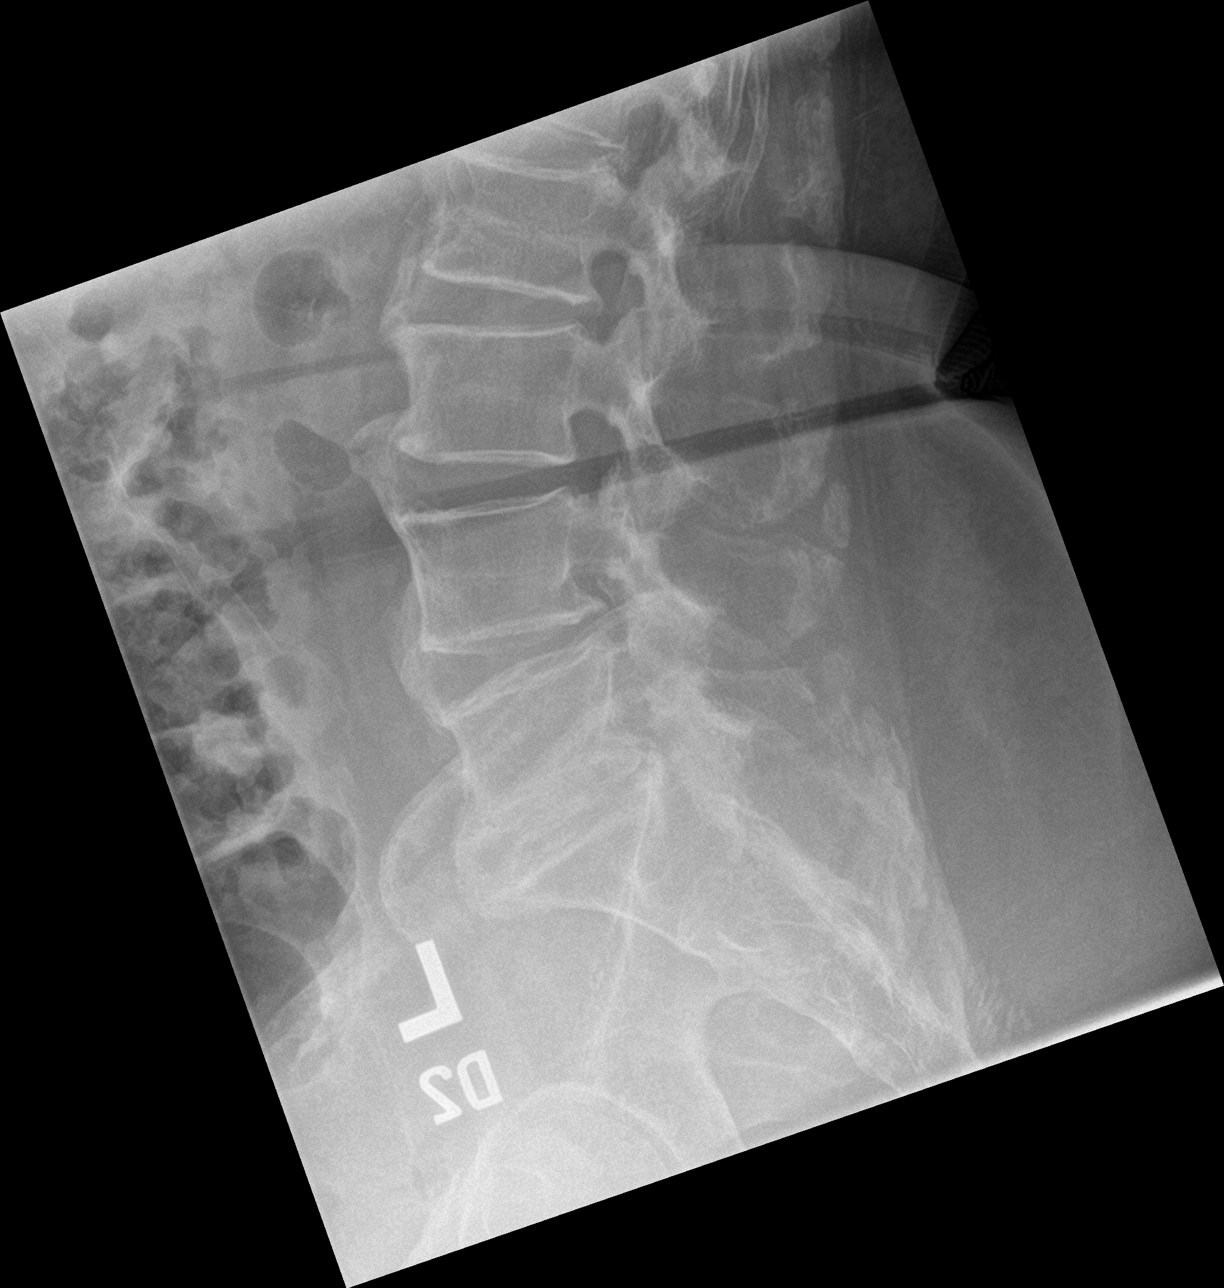

[5 of 5 positions shown; findings below may reference images not displayed]

FINDINGS: Mild superior endplate compression deformity at L1 is unchanged from
lateral chest radiograph [DATE]. Remaining vertebral body
heights are maintained. No acute fracture identified. No static
listhesis. Bridging anterior endplate osteophytosis within the
lumbar spine. Lower lumbar facet arthrosis. Thoracic intervertebral
disc spaces are preserved.
IMPRESSION: 1. No evidence of acute fracture or static listhesis of the thoracic
or lumbar spine.
2. Unchanged mild L1 superior endplate compression deformity.

## 2020-01-31 MED ORDER — ACETAMINOPHEN 500 MG PO TABS
1000.0000 mg | ORAL_TABLET | Freq: Once | ORAL | Status: AC
  Administered 2020-01-31: 1000 mg via ORAL
  Filled 2020-01-31: qty 2

## 2020-01-31 NOTE — H&P (Signed)
CARDIOLOGY ADMIT NOTE   Patient ID: Crystal Ashley MRN: [id number] DOB/AGE: June 15, 1946 74 y.o.  Admit date: 01/31/2020 Primary Physician:  [name], PA  Patient ID: Crystal Ashley, female    DOB: [birth year], 74 y.o.   MRN: [id number]  Chief Complaint  Patient presents with  . Near Syncope   HPI:    Crystal Ashley  is a 74 y.o. patient [place] female with diabetes mellitus, hyperlipidemia, hypertension, permanent atrial fibrillation, last cardioversion 04/08/2018 and repeat cardioversion on 05/22/2019 was unsuccessful.  I had recently seen her on 01/29/2020 and she was seen atrial fibrillation with rapid ventricular response.  I had added diltiazem CD 180 mg daily both for hypertension and A. fib with RVR.  She took her first dose today about 10 to 15 minutes prior to the episode.  States that she was having breakfast, and then she tried to get down and felt her left leg to be numb.  She describes this as legs going to sleep and she has had this before, hence fell to the ground but never lost consciousness.  She hit her back but not her head.  No chest pain, no shortness of breath.  Past Medical History:  Diagnosis Date  . Cancer ([place])   . Cataract   . Glaucoma   . Hypertension   . Thyroid disease    Past Surgical History:  Procedure Laterality Date  . CARDIOVERSION N/A 04/08/2018   Procedure: CARDIOVERSION;  Surgeon: Adrian Prows, MD;  Location: [facility] ENDOSCOPY;  Service: Cardiovascular;  Laterality: N/A;  . CARDIOVERSION N/A 05/22/2019   Procedure: CARDIOVERSION;  Surgeon: Adrian Prows, MD;  Location: [place];  Service: Cardiovascular;  Laterality: N/A;  . FOOT SURGERY Left   . HYSTEROSCOPY     POLYPECTOMY   Social History   Socioeconomic History  . Marital status: Widowed    Spouse name: Not on file  . Number of children: 0  . Years of education: Not on file  . Highest education level: Not on file  Occupational History  . Not on file  Tobacco Use  . Smoking  status: Never Smoker  . Smokeless tobacco: Never Used  Vaping Use  . Vaping Use: Never used  Substance and Sexual Activity  . Alcohol use: No  . Drug use: Not Currently  . Sexual activity: Yes  Other Topics Concern  . 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 Difficulty of Paying Living Expenses:   Food Insecurity:   . Worried About Charity fundraiser in the Last Year:   . Arboriculturist in the Last Year:   Transportation Needs:   . Film/video editor (Medical):   Marland Kitchen Lack of Transportation (Non-Medical):   Physical Activity:   . Days of Exercise per Week:   . Minutes of Exercise per Session:   Stress:   . Feeling of Stress :   Social Connections:   . Frequency of Communication with Friends and Family:   . Frequency of Social Gatherings with Friends and Family:   . Attends Religious Services:   . Active Member of Clubs or Organizations:   . Attends Archivist Meetings:   Marland Kitchen Marital Status:   Intimate Partner Violence:   . Fear of Current or Ex-Partner:   . Emotionally Abused:   Marland Kitchen Physically Abused:   .  Sexually Abused:    ROS  Review of Systems  Cardiovascular: Positive for dyspnea on exertion. Negative for chest pain and leg swelling.  Musculoskeletal: Positive for back pain and muscle weakness (uses walker).  Gastrointestinal: Negative for melena.  Neurological: Positive for paresthesias.  All other systems reviewed and are negative.  Objective   Vitals with BMI 01/31/2020 01/31/2020 01/31/2020  Height - - -  Weight - - -  BMI - - -  Systolic - - -  Diastolic - - -  Pulse 456 96 109      Physical Exam Constitutional:      Appearance: Normal appearance.     Comments: Moderately built and moderately obese in no acute distress.  Neck:     Thyroid: No thyromegaly.     Vascular: No JVD.  Cardiovascular:     Rate and Rhythm:  Normal rate. Rhythm irregularly irregular.     Pulses: Normal pulses and intact distal pulses.     Heart sounds: No murmur heard.  No gallop. No S3 or S4 sounds.      Comments: S1 is variable, S2 is normal.  No leg edema.  Pulmonary:     Effort: Pulmonary effort is normal.     Breath sounds: Normal breath sounds.  Abdominal:     General: Bowel sounds are normal.     Palpations: Abdomen is soft.  Musculoskeletal:        General: Normal range of motion.  Skin:    General: Skin is warm and dry.  Neurological:     General: No focal deficit present.     Mental Status: She is alert.  Psychiatric:        Mood and Affect: Mood normal.    Laboratory examination:   Recent Labs    05/18/19 1539  NA 141  K 4.2  CL 98  CO2 24  GLUCOSE 103*  BUN 15  CREATININE 1.36*  CALCIUM 9.7  GFRNONAA 39*  GFRAA 45*   CrCl cannot be calculated (Patient's most recent lab result is older than the maximum 21 days allowed.).  CMP Latest Ref Rng & Units 05/18/2019 01/17/2019 10/21/2018  Glucose 65 - 99 mg/dL 103(H) 88 124(H)  BUN 8 - 27 mg/dL 15 25(H) 25(H)  Creatinine 0.57 - 1.00 mg/dL 1.36(H) 1.26(H) 0.97  Sodium 134 - 144 mmol/L 141 138 138  Potassium 3.5 - 5.2 mmol/L 4.2 4.8 4.5  Chloride 96 - 106 mmol/L 98 100 104  CO2 20 - 29 mmol/L 24 28 28   Calcium 8.7 - 10.3 mg/dL 9.7 9.4 8.4(L)  Total Protein 6.0 - 8.3 g/dL - 6.8 -  Total Bilirubin 0.2 - 1.2 mg/dL - 0.5 -  Alkaline Phos 39 - 117 U/L - 81 -  AST 0 - 37 U/L - 9 -  ALT 0 - 35 U/L - 7 -   CBC Latest Ref Rng & Units 01/31/2020 01/17/2019 10/19/2018  WBC 4.0 - 10.5 K/uL 15.9(H) 10.8(H) 11.9(H)  Hemoglobin 12.0 - 15.0 g/dL 10.8(L) 12.3 12.2  Hematocrit 36 - 46 % 37.2 36.6 40.3  Platelets 150 - 400 K/uL 275 318.0 285   Lipid Panel     Component Value Date/Time   CHOL 129 11/29/2019 0944   TRIG 142.0 11/29/2019 0944   HDL 38.00 (L) 11/29/2019 0944   CHOLHDL 3 11/29/2019 0944   VLDL 28.4 11/29/2019 0944   LDLCALC 63 11/29/2019 0944    HEMOGLOBIN A1C Lab Results  Component Value Date   HGBA1C 6.8 (H)  11/29/2019   TSH Recent Labs    11/29/19 0944  TSH 1.69   BNP (last 3 results) No results for input(s): BNP in the last 8760 hours.  Medications and allergies  No Known Allergies    Current Outpatient Medications  Medication Instructions  . atorvastatin (LIPITOR) 10 mg, Oral, Daily  . cholecalciferol (VITAMIN D) 5,000 Units, Oral, See admin instructions, Sun, Mon, Wed, friday  . cyanocobalamin (,VITAMIN B-12,) 1000 MCG/ML injection 1000 mcg (1 mg) injection once per week for four weeks, followed by 1000 mcg injection once per month.  . diltiazem (CARDIZEM CD) 180 mg, Oral, Daily  . dorzolamide-timolol (COSOPT) 22.3-6.8 MG/ML ophthalmic solution 1 drop, Both Eyes, 2 times daily  . furosemide (LASIX) 40 mg, Oral, Daily  . levothyroxine (SYNTHROID) 75 mcg, Oral, Daily before breakfast  . metFORMIN (GLUCOPHAGE) 1,000 mg, Oral, Daily with breakfast  . metoprolol succinate (TOPROL-XL) 100 mg, Oral, Daily, Take with or immediately following a meal.  . Rivaroxaban (XARELTO) 15 mg, Oral, Daily with supper  . travoprost, benzalkonium, (TRAVATAN) 0.004 % ophthalmic solution 1 drop, Both Eyes, Daily at bedtime  . valsartan (DIOVAN) 160 mg, Oral, Daily    No intake/output data recorded. No intake/output data recorded.   Radiology:  DG Thoracic Spine 2 View  Result Date: 01/31/2020 CLINICAL DATA:  Back pain after fall EXAM: THORACIC SPINE 2 VIEWS; LUMBAR SPINE - COMPLETE 4+ VIEW COMPARISON:  10/18/2018 FINDINGS: Mild superior endplate compression deformity at L1 is unchanged from lateral chest radiograph 10/18/2018. Remaining vertebral body heights are maintained. No acute fracture identified. No static listhesis. Bridging anterior endplate osteophytosis within the lumbar spine. Lower lumbar facet arthrosis. Thoracic intervertebral disc spaces are preserved. IMPRESSION: 1. No evidence of acute fracture or static listhesis  of the thoracic or lumbar spine. 2. Unchanged mild L1 superior endplate compression deformity. Electronically Signed   By: Davina Poke D.O.   On: 01/31/2020 13:32   DG Lumbar Spine Complete  Result Date: 01/31/2020 CLINICAL DATA:  Back pain after fall EXAM: THORACIC SPINE 2 VIEWS; LUMBAR SPINE - COMPLETE 4+ VIEW COMPARISON:  10/18/2018 FINDINGS: Mild superior endplate compression deformity at L1 is unchanged from lateral chest radiograph 10/18/2018. Remaining vertebral body heights are maintained. No acute fracture identified. No static listhesis. Bridging anterior endplate osteophytosis within the lumbar spine. Lower lumbar facet arthrosis. Thoracic intervertebral disc spaces are preserved. IMPRESSION: 1. No evidence of acute fracture or static listhesis of the thoracic or lumbar spine. 2. Unchanged mild L1 superior endplate compression deformity. Electronically Signed   By: Davina Poke D.O.   On: 01/31/2020 13:32   Cardiac Studies:   Lexiscan myoview stress test 04/01/2018: 1. Lexiscan stress test was performed. Exercise capacity was not assessed. No stress symptoms reported. Normal blood pressure. The resting and stress electrocardiogram demonstrated atrial fibrillation with rapid ventricular rate, low voltage, and normal rest repolarization. Stress EKG is non diagnostic for ischemia as it is a pharmacologic stress. 2. The overall quality of the study is fair. Review of the raw data in a rotational cine format reveals breast attenuation, with imaging performed in sitting position. Gated SPECT images reveal normal myocardial thickening and wall motion. The left ventricular ejection fraction was calculated or visually estimated to be 47%. REST and STRESS images demonstrate decreased tracer uptake in the mid inferoseptal, mid inferior, apical septal and apical inferior segments of the left ventricle, worse on rest images. While defect likely represents breast attenuation, ischemia in this  region cannot be excluded. Clinical correlation recommended.  3. Low to intermediate risk study.  Echocardiogram 10/19/2018 :   1. The left ventricle has normal systolic function, with an ejection fraction of 55-60%. The cavity size was normal. Left ventricular diastolic Doppler parameters are indeterminate No evidence of left ventricular regional wall motion abnormalities.  2. Left atrial size was moderately dilated.  3. The mitral valve is degenerative. Mild thickening of the mitral valve leaflet. Mild calcification of the mitral valve leaflet.  4. The tricuspid valve is normal in structure.  5. The aortic valve is tricuspid Mild calcification of the aortic valve.  6. Pulmonary hypertension is mild. PA pressure 40 mm Hg (CVP 15)  EKG:  EKG 01/31/2020: Atrial fibrillation with controlled ventricular response at the rate of 98 bpm, normal axis, diffuse nonspecific T abnormality.  Cannot exclude anterior ischemia.  Abnormal EKG.  EKG 01/29/2020: Atrial fibrillation with rapid ventricular response at rate of 111 bpm, normal axis, nonspecific T wave abnormality.  Low-voltage complexes.  Compared to 05/31/2019, rapid ventricular response is new.  Assessment   1. Fall and right leg numbness described as "went to sleep" lasted 2 hours. No residual symptoms. No motor weakness. 2. Permanent atrial fibrillation with RVR Permanent atrial fibrillation ([place]). CHA2DS2-VASc Score is 6.  Yearly risk of stroke: 9.8% (A, F, HTN, DM, CHF). 3. Hypertension: Low BP when EMS arrived. 4. DM controlled without hyperglycemia. Suspect peripheral neuropathy   Recommendations:   Patient's fall probably related to generalized muscle weakness and could have been related to postural numbness but cannot exclude TIA or stroke in view of multiple cardiac risk factors including diabetes, hypertension, hyperlipidemia.  She is presently on anticoagulation and symptoms are completely resolved.  I can consider outpatient carotid  artery duplex  With regard to atrial fibrillation with RVR, 2 days ago her heart rate was much elevated, today much controlled.  However she had low blood pressure, do not know whether this is related to vasovagal episode or it was fully hypotension.  Blood pressure is now completely stable.  I will hold off on valsartan for 2 days and she can start the medication at 80 mg instead of 150 mg daily.  Continue anticoagulation for atrial fibrillation.  She can be discharged from cardiac standpoint.  Adrian Prows, MD, [facility] 01/31/2020, 2:41 PM [facility] [place] Pager: [phone] Office: [phone]

## 2020-01-31 NOTE — ED Provider Notes (Signed)
6:15 PM-checkout from Dr. Maryan Rued to evaluate after completion of testing.  UA and CAT scan head have returned as no acute changes.  Clinical Course as of Jan 30 1818  Wed Jan 31, 2020  1816 Normal  TSH [EW]  1816 Normal exam like a high, hemoglobin low  CBC(!) [EW]  1816 Normal  Urine rapid drug screen (hosp performed)not at [facility] [EW]  1816 Normal  Urinalysis, Routine w reflex microscopic [EW]  1817 Normal except glucose high, creatinine high, calcium low, abnormal, GFR low  Comprehensive metabolic panel(!) [EW]  9480 Radiographic images of head by CT, spine by radiographs; interpreted by radiologist as negative for acute injury.   [EW]    Clinical Course User Index [EW] Daleen Bo, MD    6:20 PM - ambulation trial requested-she was able to ambulate a total of 45 feet, without significant weakness or requirement for assistance.  6:50 PM-she is alert and cooperative, and has no complaints at this time.  Findings discussed with the patient and all questions were answered   Daleen Bo, MD 01/31/20 [phone]

## 2020-01-31 NOTE — ED Triage Notes (Signed)
Pt bib [facility] w/ c/o near syncope episode this morning. Pt took her first dose of 180 mg cardizem this morning while at breakfast and then when she stood up felt weak and then nearly passed out. Pt fell to the floor but did not lose consciousness per EMS. Pt did not hit her head. Pt does take xeralto for afib. Initial BP w/ EMS 68/p, however improved to 112/90 w/ positioning. EMS EKG shows afib w/ HR 80-100. CBG 307, 98% RA.

## 2020-01-31 NOTE — ED Notes (Signed)
PT ambulated in the hall. PT tolerated well, PT stated that she was not dizzy, but stated back pain upon standing.

## 2020-01-31 NOTE — Progress Notes (Signed)
Orthopedic Tech Progress Note Patient Details:  Crystal Ashley [birth year] [id number] Level 2 trauma Patient ID: [name], female   DOB: Oct 08, 1945, 74 y.o.   MRN: [id number]   [name] 01/31/2020, 12:34 PM

## 2020-01-31 NOTE — ED Notes (Signed)
Patient verbalizes understanding of discharge instructions. Opportunity for questioning and answers were provided. Armband removed by staff, pt discharged from ED ambulatory.   

## 2020-01-31 NOTE — Discharge Instructions (Addendum)
Dr. Einar Gip recommended holding your valsartan 3 days and then starting back at half a tablet.  You can also take Tylenol as needed for back pain because you are going to be pretty sore for the next few days due to the fall.  Call Dr. Einar Gip for an appointment to be seen next week, for a checkup.  Make sure that you are eating and drinking well.  Take your other medications as directed.

## 2020-01-31 NOTE — Telephone Encounter (Signed)
Patient son called about mother. He states 5 minutes after taking diltiazem patient legs went weak/numb, she collapsed but did not loose consciousness . EMS was called he was told that her systolic b/p was 60. He didn't recall other vitals. She was taken to the hospital. Can be reached at [phone]

## 2020-01-31 NOTE — ED Provider Notes (Signed)
[facility] EMERGENCY DEPARTMENT Provider Note   CSN: [id number] Arrival date & time: 01/31/20  1216     History Chief Complaint  Patient presents with  . Near Syncope    Jonasia Ameliya Nicotra is a 74 y.o. female.  Patient is a 74 year old female with a history of diabetes, CHF, chronic A. fib on Xarelto, hypertension who is presenting today due to a fall.  Patient reports that she was walking with her walker today when her left leg started feeling numb which she describes as a sensation like your foot has gone to sleep which lasted approximately 1 hour and caused her to fall because she reports her leg would not work.  She fell backwards landing on her back but denies hitting her head or losing consciousness.  She denies any weakness or numbness in her leg currently and denies any symptoms in her arm face or difficulty with speech or swallowing during this event.  She denies any chest pain or shortness of breath.  She recently started diltiazem today after seeing Dr. Einar Gip yesterday and being noted to be in A. fib with a rapid rate.  Patient denies any dizziness, lightheadedness, near-syncope symptoms.   Near Syncope       Past Medical History:  Diagnosis Date  . Cancer ([place])   . Cataract   . Glaucoma   . Hypertension   . Thyroid disease     Patient Active Problem List   Diagnosis Date Noted  . Acute on chronic combined systolic and diastolic CHF (congestive heart failure) ([place])   . Acute respiratory failure with hypoxia ([place]) 10/18/2018  . Declining mobility 09/23/2018  . B12 deficiency 09/23/2018  . Morbid obesity with BMI of 40.0-44.9, adult ([place]) 09/23/2018  . Anemia 09/23/2018  . History of cardioversion 04/26/2018  . Type 2 diabetes mellitus without complication, without long-term current use of insulin ([place]) 03/15/2018  . Atrial fibrillation with RVR ([place]) 03/15/2018  . Morbid obesity ([place]) 01/04/2017  . Hyperlipidemia associated with type 2  diabetes mellitus ([place]) 01/04/2017  . Vitamin D deficiency 12/15/2016  . Glaucoma   . Hypertension associated with diabetes ([place]) 04/19/2014  . Hypothyroidism 04/19/2014  . History of endometrial cancer 03/30/2014    Past Surgical History:  Procedure Laterality Date  . CARDIOVERSION N/A 04/08/2018   Procedure: CARDIOVERSION;  Surgeon: Adrian Prows, MD;  Location: [facility] ENDOSCOPY;  Service: Cardiovascular;  Laterality: N/A;  . CARDIOVERSION N/A 05/22/2019   Procedure: CARDIOVERSION;  Surgeon: Adrian Prows, MD;  Location: [place];  Service: Cardiovascular;  Laterality: N/A;  . FOOT SURGERY Left   . HYSTEROSCOPY     POLYPECTOMY     OB History    Gravida  0   Para      Term      Preterm      AB      Living        SAB      TAB      Ectopic      Multiple      Live Births              Family History  Problem Relation Age of Onset  . Diabetes Son   . Healthy Mother   . Healthy Father     Social History   Tobacco Use  . Smoking status: Never Smoker  . Smokeless tobacco: Never Used  Vaping Use  . Vaping Use: Never used  Substance Use Topics  . Alcohol use: No  .  Drug use: Not Currently    Home Medications Prior to Admission medications   Medication Sig Start Date End Date Taking? Authorizing Provider  atorvastatin (LIPITOR) 10 MG tablet Take 1 tablet (10 mg total) by mouth daily. 12/01/19  Yes [name], PA  cholecalciferol (VITAMIN D) 1000 UNITS tablet Take 5,000 Units by mouth See admin instructions. Dorene Grebe, Wed, friday   Yes [provider]  cyanocobalamin (,VITAMIN B-12,) 1000 MCG/ML injection 1000 mcg (1 mg) injection once per week for four weeks, followed by 1000 mcg injection once per month. Patient taking differently: Inject 1,000 mcg into the muscle every 30 (thirty) days.  09/23/18  Yes Briscoe Deutscher, DO  diltiazem (CARDIZEM CD) 180 MG 24 hr capsule Take 1 capsule (180 mg total) by mouth daily. 01/29/20 04/28/20 Yes Adrian Prows, MD   dorzolamide-timolol (COSOPT) 22.3-6.8 MG/ML ophthalmic solution Place 1 drop into both eyes 2 (two) times daily.  10/05/17  Yes [provider]  furosemide (LASIX) 40 MG tablet Take 1 tablet (40 mg total) by mouth daily. 11/29/19  Yes [name], PA  levothyroxine (SYNTHROID) 75 MCG tablet Take 1 tablet (75 mcg total) by mouth daily before breakfast. 12/01/19  Yes [name], PA  metFORMIN (GLUCOPHAGE) 1000 MG tablet Take 1 tablet (1,000 mg total) by mouth daily with breakfast. 12/01/19  Yes [name], PA  metoprolol succinate (TOPROL-XL) 100 MG 24 hr tablet Take 1 tablet (100 mg total) by mouth daily. Take with or immediately following a meal. 12/01/19  Yes Morene Rankins, [place], PA  Rivaroxaban (XARELTO) 15 MG TABS tablet Take 1 tablet (15 mg total) by mouth daily with supper. Patient taking differently: Take 15 mg by mouth daily.  05/22/19  Yes Adrian Prows, MD  travoprost, benzalkonium, (TRAVATAN) 0.004 % ophthalmic solution Place 1 drop into both eyes at bedtime.    Yes [provider]  valsartan (DIOVAN) 160 MG tablet Take 1 tablet (160 mg total) by mouth daily. 11/29/19  Yes [name], PA    Allergies    Patient has no known allergies.  Review of Systems   Review of Systems  Cardiovascular: Positive for near-syncope.  All other systems reviewed and are negative.   Physical Exam Updated Vital Signs BP 130/67   Pulse (!) 106   Temp 98.6 F (37 C) (Oral)   Resp 18   SpO2 98%   Physical Exam Vitals and nursing note reviewed.  Constitutional:      General: She is not in acute distress.    Appearance: She is well-developed. She is obese.  HENT:     Head: Normocephalic and atraumatic.  Eyes:     Pupils: Pupils are equal, round, and reactive to light.  Cardiovascular:     Rate and Rhythm: Tachycardia present. Rhythm irregularly irregular.     Heart sounds: Normal heart sounds. No murmur heard.  No friction rub.  Pulmonary:     Effort:  Pulmonary effort is normal.     Breath sounds: Normal breath sounds. No wheezing or rales.  Abdominal:     General: Bowel sounds are normal. There is no distension.     Palpations: Abdomen is soft.     Tenderness: There is no abdominal tenderness. There is no guarding or rebound.  Musculoskeletal:     Cervical back: Normal range of motion and neck supple. No rigidity or tenderness.     Thoracic back: Tenderness and bony tenderness present. Decreased range of motion.     Lumbar back: Tenderness and bony  tenderness present. Decreased range of motion.     Comments: No edema  Skin:    General: Skin is warm and dry.     Capillary Refill: Capillary refill takes less than 2 seconds.     Findings: No rash.  Neurological:     Mental Status: She is alert and oriented to person, place, and time. Mental status is at baseline.     Cranial Nerves: No cranial nerve deficit.     Sensory: No sensory deficit.     Comments: 4-5 strength present in bilateral lower extremities.  5 of 5 strength present in bilateral upper extremities  Psychiatric:        Mood and Affect: Mood normal.        Behavior: Behavior normal.        Thought Content: Thought content normal.     ED Results / Procedures / Treatments   Labs (all labs ordered are listed, but only abnormal results are displayed) Labs Reviewed  CBC - Abnormal; Notable for the following components:      Result Value   WBC 15.9 (*)    Hemoglobin 10.8 (*)    MCHC 29.0 (*)    RDW 15.9 (*)    All other components within normal limits  COMPREHENSIVE METABOLIC PANEL - Abnormal; Notable for the following components:   Glucose, Bld 211 (*)    Creatinine, Ser 1.10 (*)    Calcium 8.7 (*)    Albumin 3.3 (*)    GFR calc non Af Amer 49 (*)    GFR calc Af Amer 57 (*)    All other components within normal limits  RAPID URINE DRUG SCREEN, HOSP PERFORMED  URINALYSIS, ROUTINE W REFLEX MICROSCOPIC  TSH    EKG EKG Interpretation  Date/Time:  Wednesday  January 31 2020 12:22:45 EDT Ventricular Rate:  98 PR Interval:    QRS Duration: 162 QT Interval:  344 QTC Calculation: 397 R Axis:   15 Text Interpretation: Atrial fibrillation Ventricular premature complex Nonspecific intraventricular conduction delay Borderline abnrm T, anterolateral leads No significant change since last tracing Confirmed by Blanchie Dessert [phone]) on 01/31/2020 12:39:49 PM   Radiology DG Thoracic Spine 2 View  Result Date: 01/31/2020 CLINICAL DATA:  Back pain after fall EXAM: THORACIC SPINE 2 VIEWS; LUMBAR SPINE - COMPLETE 4+ VIEW COMPARISON:  10/18/2018 FINDINGS: Mild superior endplate compression deformity at L1 is unchanged from lateral chest radiograph 10/18/2018. Remaining vertebral body heights are maintained. No acute fracture identified. No static listhesis. Bridging anterior endplate osteophytosis within the lumbar spine. Lower lumbar facet arthrosis. Thoracic intervertebral disc spaces are preserved. IMPRESSION: 1. No evidence of acute fracture or static listhesis of the thoracic or lumbar spine. 2. Unchanged mild L1 superior endplate compression deformity. Electronically Signed   By: Davina Poke D.O.   On: 01/31/2020 13:32   DG Lumbar Spine Complete  Result Date: 01/31/2020 CLINICAL DATA:  Back pain after fall EXAM: THORACIC SPINE 2 VIEWS; LUMBAR SPINE - COMPLETE 4+ VIEW COMPARISON:  10/18/2018 FINDINGS: Mild superior endplate compression deformity at L1 is unchanged from lateral chest radiograph 10/18/2018. Remaining vertebral body heights are maintained. No acute fracture identified. No static listhesis. Bridging anterior endplate osteophytosis within the lumbar spine. Lower lumbar facet arthrosis. Thoracic intervertebral disc spaces are preserved. IMPRESSION: 1. No evidence of acute fracture or static listhesis of the thoracic or lumbar spine. 2. Unchanged mild L1 superior endplate compression deformity. Electronically Signed   By: Davina Poke D.O.   On:  01/31/2020 13:32  Procedures Procedures (including critical care time)  Medications Ordered in ED Medications  acetaminophen (TYLENOL) tablet 1,000 mg (1,000 mg Oral Given 01/31/20 1252)    ED Course  I have reviewed the triage vital signs and the nursing notes.  Pertinent labs & imaging results that were available during my care of the patient were reviewed by me and considered in my medical decision making (see chart for details).    MDM Rules/Calculators/A&P                          Elderly female presenting today after a fall where she reports her left leg was numb and was not working correctly.  Patient does have multiple risk factors for stroke and TIA and reports her leg feels normal now.  Patient is refusing CAT scan and reports that she feels fine and there was no injury to her head.  She does take Xarelto daily for atrial fibrillation which she is in today with a mild component of RVR but is improving and now almost less than 100.  Patient recently started diltiazem today due to having recurrent A. fib RVR but denies any chest pain, shortness of breath or sensation of dizziness.  She is well-appearing on exam at this time and is able to give full history.  She is having pain in her thoracic and lumbar back which started after she fell.  Plain images are negative for acute fracture of the thoracic and lumbar spine at this time.  EKG is unchanged today with atrial fibrillation with no acute findings.  CBC with a leukocytosis today of almost 16,000 with unchanged hemoglobin.  CMP without acute changes and family and pt deny any recent infectious sx.  uA wnl.  Pt is now willing to get head CT to eval given leg numbness. Dr. Einar Gip recommended holding valsartan for 3 days and then starting back at 1/2 tab of 160mg .  MDM Number of Diagnoses or Management Options   Amount and/or Complexity of Data Reviewed Clinical lab tests: ordered and reviewed Tests in the radiology section of CPT:  ordered and reviewed Tests in the medicine section of CPT: ordered and reviewed Decide to obtain previous medical records or to obtain history from someone other than the patient: yes Obtain history from someone other than the patient: yes Review and summarize past medical records: yes Discuss the patient with other providers: yes Independent visualization of images, tracings, or specimens: yes  Risk of Complications, Morbidity, and/or Mortality Presenting problems: moderate Diagnostic procedures: low Management options: low  Patient Progress Patient progress: stable   Final Clinical Impression(s) / ED Diagnoses Final diagnoses:  Fall, initial encounter  Left leg weakness  Longstanding persistent atrial fibrillation [facility])    Rx / DC Orders ED Discharge Orders    None       Blanchie Dessert, MD 01/31/20 1710

## 2020-02-05 ENCOUNTER — Ambulatory Visit: Payer: Medicare Other | Admitting: Physician Assistant

## 2020-02-05 NOTE — Progress Notes (Deleted)
Crystal Ashley is a 74 y.o. female is here for hospitalization follow up.  I acted as a Education administrator for [employer], PA-C [employer], [place]  History of Present Illness:   No chief complaint on file.   HPI   Patient presented to ED on 01/31/20 s/p fall -- states that her leg fell asleep and it caused her to fall when she tried to walk. She refused CT scan in the ED because she did not hit her head. She reported pain in lumbar and thoracic spine -- xrays were negative for acute findings. Her cardiologist, Dr. Einar Gip, had stopped her norvasc 5 mg and started her on diltiazem 180 mg. She was instructed to maintain her valsartan 180 mg. Dr. Einar Gip was contacted during her ED visit and was instructed to hold her Valsartan x 3 days and then resume at 80 mg daily. ED work-up also revealed WBC: 15.9 -- normal UA and no signs of infection.       Health Maintenance Due  Topic Date Due  . Fecal DNA (Cologuard)  Never done  . FOOT EXAM  06/15/2019    Past Medical History:  Diagnosis Date  . Cancer ([place])   . Cataract   . Glaucoma   . Hypertension   . Thyroid disease      Social History   Tobacco Use  . Smoking status: Never Smoker  . Smokeless tobacco: Never Used  Vaping Use  . Vaping Use: Never used  Substance Use Topics  . Alcohol use: No  . Drug use: Not Currently    Past Surgical History:  Procedure Laterality Date  . CARDIOVERSION N/A 04/08/2018   Procedure: CARDIOVERSION;  Surgeon: Adrian Prows, MD;  Location[facility] ENDOSCOPY;  Service: Cardiovascular;  Laterality: N/A;  . CARDIOVERSION N/A 05/22/2019   Procedure: CARDIOVERSION;  Surgeon: Adrian Prows, MD;  Location: [place];  Service: Cardiovascular;  Laterality: N/A;  . FOOT SURGERY Left   . HYSTEROSCOPY     POLYPECTOMY    Family History  Problem Relation Age of Onset  . Diabetes Son   . Healthy Mother   . Healthy Father     PMHx, SurgHx, SocialHx, FamHx, Medications, and Allergies were reviewed in the  Visit Navigator and updated as appropriate.   Patient Active Problem List   Diagnosis Date Noted  . Acute on chronic combined systolic and diastolic CHF (congestive heart failure) ([place])   . Acute respiratory failure with hypoxia ([place]) 10/18/2018  . Declining mobility 09/23/2018  . B12 deficiency 09/23/2018  . Morbid obesity with BMI of 40.0-44.9, adult ([place]) 09/23/2018  . Anemia 09/23/2018  . History of cardioversion 04/26/2018  . Type 2 diabetes mellitus without complication, without long-term current use of insulin ([place]) 03/15/2018  . Atrial fibrillation with RVR ([place]) 03/15/2018  . Morbid obesity ([place]) 01/04/2017  . Hyperlipidemia associated with type 2 diabetes mellitus ([place]) 01/04/2017  . Vitamin D deficiency 12/15/2016  . Glaucoma   . Hypertension associated with diabetes ([place]) 04/19/2014  . Hypothyroidism 04/19/2014  . History of endometrial cancer 03/30/2014    Social History   Tobacco Use  . Smoking status: Never Smoker  . Smokeless tobacco: Never Used  Vaping Use  . Vaping Use: Never used  Substance Use Topics  . Alcohol use: No  . Drug use: Not Currently    Current Medications and Allergies:    Current Outpatient Medications:  .  atorvastatin (LIPITOR) 10 MG tablet, Take 1 tablet (10 mg total) by mouth daily., Disp:  90 tablet, Rfl: 3 .  cholecalciferol (VITAMIN D) 1000 UNITS tablet, Take 5,000 Units by mouth See admin instructions. Sun, Mon, Wed, friday, Disp: , Rfl:  .  cyanocobalamin (,VITAMIN B-12,) 1000 MCG/ML injection, 1000 mcg (1 mg) injection once per week for four weeks, followed by 1000 mcg injection once per month. (Patient taking differently: Inject 1,000 mcg into the muscle every 30 (thirty) days. ), Disp: 30 mL, Rfl: 3 .  diltiazem (CARDIZEM CD) 180 MG 24 hr capsule, Take 1 capsule (180 mg total) by mouth daily., Disp: 30 capsule, Rfl: 2 .  dorzolamide-timolol (COSOPT) 22.3-6.8 MG/ML ophthalmic solution, Place 1 drop into both eyes 2 (two) times  daily. , Disp: , Rfl:  .  furosemide (LASIX) 40 MG tablet, Take 1 tablet (40 mg total) by mouth daily., Disp: 90 tablet, Rfl: 1 .  levothyroxine (SYNTHROID) 75 MCG tablet, Take 1 tablet (75 mcg total) by mouth daily before breakfast., Disp: 90 tablet, Rfl: 3 .  metFORMIN (GLUCOPHAGE) 1000 MG tablet, Take 1 tablet (1,000 mg total) by mouth daily with breakfast., Disp: 90 tablet, Rfl: 3 .  metoprolol succinate (TOPROL-XL) 100 MG 24 hr tablet, Take 1 tablet (100 mg total) by mouth daily. Take with or immediately following a meal., Disp: 90 tablet, Rfl: 1 .  Rivaroxaban (XARELTO) 15 MG TABS tablet, Take 1 tablet (15 mg total) by mouth daily with supper. (Patient taking differently: Take 15 mg by mouth daily. ), Disp: 90 tablet, Rfl: 2 .  travoprost, benzalkonium, (TRAVATAN) 0.004 % ophthalmic solution, Place 1 drop into both eyes at bedtime. , Disp: , Rfl:  .  valsartan (DIOVAN) 160 MG tablet, Take 0.5 tablets (80 mg total) by mouth daily. Hold 6.16.21 till 6.19.2021. Hold for BP Less than 120/70 mm Hg, Disp: 90 tablet, Rfl: 1  No Known Allergies  Review of Systems   ROS  Vitals:  There were no vitals filed for this visit.   There is no height or weight on file to calculate BMI.   Physical Exam:    Physical Exam   Assessment and Plan:    There are no diagnoses linked to this encounter.  . Reviewed expectations re: course of current medical issues. . Discussed self-management of symptoms. . Outlined signs and symptoms indicating need for more acute intervention. . Patient verbalized understanding and all questions were answered. . See orders for this visit as documented in the electronic medical record. . Patient received an After Visit Summary.  ***  [name], PA-C [place], [place] 02/05/2020  Follow-up: No follow-ups on file.

## 2020-02-06 ENCOUNTER — Ambulatory Visit (INDEPENDENT_AMBULATORY_CARE_PROVIDER_SITE_OTHER): Payer: Medicare Other | Admitting: Physician Assistant

## 2020-02-06 ENCOUNTER — Inpatient Hospital Stay (HOSPITAL_COMMUNITY)
Admission: EM | Admit: 2020-02-06 | Discharge: 2020-02-12 | DRG: 536 | Disposition: A | Discharge status: Rehabilitation Facility | Payer: Medicare Other | Attending: Internal Medicine | Admitting: Internal Medicine

## 2020-02-06 ENCOUNTER — Telehealth: Payer: Self-pay

## 2020-02-06 ENCOUNTER — Other Ambulatory Visit: Payer: Self-pay

## 2020-02-06 ENCOUNTER — Encounter (HOSPITAL_COMMUNITY): Payer: Self-pay | Admitting: Emergency Medicine

## 2020-02-06 ENCOUNTER — Encounter: Payer: Self-pay | Admitting: Physician Assistant

## 2020-02-06 VITALS — BP 86/60 | HR 68 | Temp 97.5°F | Ht 60.0 in

## 2020-02-06 DIAGNOSIS — E039 Hypothyroidism, unspecified: Secondary | ICD-10-CM | POA: Diagnosis present

## 2020-02-06 DIAGNOSIS — E860 Dehydration: Secondary | ICD-10-CM | POA: Diagnosis present

## 2020-02-06 DIAGNOSIS — S22089A Unspecified fracture of T11-T12 vertebra, initial encounter for closed fracture: Secondary | ICD-10-CM | POA: Diagnosis present

## 2020-02-06 DIAGNOSIS — E1122 Type 2 diabetes mellitus with diabetic chronic kidney disease: Secondary | ICD-10-CM | POA: Diagnosis present

## 2020-02-06 DIAGNOSIS — S299XXA Unspecified injury of thorax, initial encounter: Secondary | ICD-10-CM | POA: Diagnosis not present

## 2020-02-06 DIAGNOSIS — S32591A Other specified fracture of right pubis, initial encounter for closed fracture: Secondary | ICD-10-CM | POA: Diagnosis not present

## 2020-02-06 DIAGNOSIS — I152 Hypertension secondary to endocrine disorders: Secondary | ICD-10-CM | POA: Diagnosis present

## 2020-02-06 DIAGNOSIS — Z833 Family history of diabetes mellitus: Secondary | ICD-10-CM | POA: Diagnosis not present

## 2020-02-06 DIAGNOSIS — D62 Acute posthemorrhagic anemia: Secondary | ICD-10-CM | POA: Diagnosis present

## 2020-02-06 DIAGNOSIS — E669 Obesity, unspecified: Secondary | ICD-10-CM | POA: Diagnosis present

## 2020-02-06 DIAGNOSIS — I482 Chronic atrial fibrillation, unspecified: Secondary | ICD-10-CM | POA: Diagnosis present

## 2020-02-06 DIAGNOSIS — Z79899 Other long term (current) drug therapy: Secondary | ICD-10-CM | POA: Diagnosis not present

## 2020-02-06 DIAGNOSIS — I959 Hypotension, unspecified: Secondary | ICD-10-CM

## 2020-02-06 DIAGNOSIS — Z66 Do not resuscitate: Secondary | ICD-10-CM | POA: Diagnosis present

## 2020-02-06 DIAGNOSIS — E538 Deficiency of other specified B group vitamins: Secondary | ICD-10-CM | POA: Diagnosis present

## 2020-02-06 DIAGNOSIS — I4821 Permanent atrial fibrillation: Secondary | ICD-10-CM | POA: Diagnosis present

## 2020-02-06 DIAGNOSIS — M4326 Fusion of spine, lumbar region: Secondary | ICD-10-CM | POA: Diagnosis present

## 2020-02-06 DIAGNOSIS — S32511D Fracture of superior rim of right pubis, subsequent encounter for fracture with routine healing: Secondary | ICD-10-CM | POA: Diagnosis present

## 2020-02-06 DIAGNOSIS — Z6841 Body Mass Index (BMI) 40.0 and over, adult: Secondary | ICD-10-CM | POA: Diagnosis not present

## 2020-02-06 DIAGNOSIS — R262 Difficulty in walking, not elsewhere classified: Secondary | ICD-10-CM | POA: Diagnosis not present

## 2020-02-06 DIAGNOSIS — N183 Chronic kidney disease, stage 3 unspecified: Secondary | ICD-10-CM | POA: Diagnosis not present

## 2020-02-06 DIAGNOSIS — E1165 Type 2 diabetes mellitus with hyperglycemia: Secondary | ICD-10-CM | POA: Diagnosis present

## 2020-02-06 DIAGNOSIS — W19XXXD Unspecified fall, subsequent encounter: Secondary | ICD-10-CM | POA: Diagnosis present

## 2020-02-06 DIAGNOSIS — I9589 Other hypotension: Secondary | ICD-10-CM | POA: Diagnosis not present

## 2020-02-06 DIAGNOSIS — Z6837 Body mass index (BMI) 37.0-37.9, adult: Secondary | ICD-10-CM | POA: Diagnosis not present

## 2020-02-06 DIAGNOSIS — Z20822 Contact with and (suspected) exposure to covid-19: Secondary | ICD-10-CM | POA: Diagnosis present

## 2020-02-06 DIAGNOSIS — D72829 Elevated white blood cell count, unspecified: Secondary | ICD-10-CM | POA: Diagnosis present

## 2020-02-06 DIAGNOSIS — T07XXXA Unspecified multiple injuries, initial encounter: Secondary | ICD-10-CM | POA: Diagnosis not present

## 2020-02-06 DIAGNOSIS — N1831 Chronic kidney disease, stage 3a: Secondary | ICD-10-CM | POA: Diagnosis present

## 2020-02-06 DIAGNOSIS — I480 Paroxysmal atrial fibrillation: Secondary | ICD-10-CM | POA: Diagnosis present

## 2020-02-06 DIAGNOSIS — N179 Acute kidney failure, unspecified: Secondary | ICD-10-CM | POA: Diagnosis present

## 2020-02-06 DIAGNOSIS — E1159 Type 2 diabetes mellitus with other circulatory complications: Secondary | ICD-10-CM | POA: Diagnosis present

## 2020-02-06 DIAGNOSIS — I5032 Chronic diastolic (congestive) heart failure: Secondary | ICD-10-CM | POA: Diagnosis present

## 2020-02-06 DIAGNOSIS — E785 Hyperlipidemia, unspecified: Secondary | ICD-10-CM | POA: Diagnosis present

## 2020-02-06 DIAGNOSIS — Z8589 Personal history of malignant neoplasm of other organs and systems: Secondary | ICD-10-CM

## 2020-02-06 DIAGNOSIS — Z79890 Hormone replacement therapy (postmenopausal): Secondary | ICD-10-CM

## 2020-02-06 DIAGNOSIS — E559 Vitamin D deficiency, unspecified: Secondary | ICD-10-CM | POA: Diagnosis present

## 2020-02-06 DIAGNOSIS — W19XXXA Unspecified fall, initial encounter: Secondary | ICD-10-CM | POA: Diagnosis present

## 2020-02-06 DIAGNOSIS — M8008XA Age-related osteoporosis with current pathological fracture, vertebra(e), initial encounter for fracture: Secondary | ICD-10-CM | POA: Diagnosis not present

## 2020-02-06 DIAGNOSIS — M40294 Other kyphosis, thoracic region: Secondary | ICD-10-CM | POA: Diagnosis present

## 2020-02-06 DIAGNOSIS — H409 Unspecified glaucoma: Secondary | ICD-10-CM | POA: Diagnosis present

## 2020-02-06 DIAGNOSIS — S32810A Multiple fractures of pelvis with stable disruption of pelvic ring, initial encounter for closed fracture: Secondary | ICD-10-CM

## 2020-02-06 DIAGNOSIS — E1169 Type 2 diabetes mellitus with other specified complication: Secondary | ICD-10-CM | POA: Diagnosis present

## 2020-02-06 DIAGNOSIS — S22079A Unspecified fracture of T9-T10 vertebra, initial encounter for closed fracture: Secondary | ICD-10-CM | POA: Diagnosis present

## 2020-02-06 DIAGNOSIS — S22080A Wedge compression fracture of T11-T12 vertebra, initial encounter for closed fracture: Secondary | ICD-10-CM | POA: Diagnosis not present

## 2020-02-06 DIAGNOSIS — Z7984 Long term (current) use of oral hypoglycemic drugs: Secondary | ICD-10-CM | POA: Diagnosis not present

## 2020-02-06 DIAGNOSIS — M546 Pain in thoracic spine: Secondary | ICD-10-CM | POA: Diagnosis not present

## 2020-02-06 DIAGNOSIS — S32511A Fracture of superior rim of right pubis, initial encounter for closed fracture: Secondary | ICD-10-CM | POA: Diagnosis not present

## 2020-02-06 DIAGNOSIS — Z7901 Long term (current) use of anticoagulants: Secondary | ICD-10-CM

## 2020-02-06 DIAGNOSIS — S3992XA Unspecified injury of lower back, initial encounter: Secondary | ICD-10-CM | POA: Diagnosis not present

## 2020-02-06 DIAGNOSIS — R531 Weakness: Secondary | ICD-10-CM | POA: Diagnosis not present

## 2020-02-06 DIAGNOSIS — S199XXA Unspecified injury of neck, initial encounter: Secondary | ICD-10-CM | POA: Diagnosis not present

## 2020-02-06 DIAGNOSIS — I4891 Unspecified atrial fibrillation: Secondary | ICD-10-CM | POA: Diagnosis not present

## 2020-02-06 DIAGNOSIS — M545 Low back pain: Secondary | ICD-10-CM | POA: Diagnosis not present

## 2020-02-06 DIAGNOSIS — E119 Type 2 diabetes mellitus without complications: Secondary | ICD-10-CM

## 2020-02-06 DIAGNOSIS — I10 Essential (primary) hypertension: Secondary | ICD-10-CM

## 2020-02-06 DIAGNOSIS — I5042 Chronic combined systolic (congestive) and diastolic (congestive) heart failure: Secondary | ICD-10-CM | POA: Diagnosis present

## 2020-02-06 DIAGNOSIS — S22070A Wedge compression fracture of T9-T10 vertebra, initial encounter for closed fracture: Secondary | ICD-10-CM | POA: Diagnosis not present

## 2020-02-06 DIAGNOSIS — M4854XD Collapsed vertebra, not elsewhere classified, thoracic region, subsequent encounter for fracture with routine healing: Secondary | ICD-10-CM | POA: Diagnosis present

## 2020-02-06 HISTORY — DX: Unspecified atrial fibrillation: I48.91

## 2020-02-06 LAB — CBC
HCT: 39.4 % (ref 36.0–46.0)
Hemoglobin: 11.3 g/dL — ABNORMAL LOW (ref 12.0–15.0)
MCH: 26.4 pg (ref 26.0–34.0)
MCHC: 28.7 g/dL — ABNORMAL LOW (ref 30.0–36.0)
MCV: 92.1 fL (ref 80.0–100.0)
Platelets: 446 10*3/uL — ABNORMAL HIGH (ref 150–400)
RBC: 4.28 MIL/uL (ref 3.87–5.11)
RDW: 17.2 % — ABNORMAL HIGH (ref 11.5–15.5)
WBC: 15.1 10*3/uL — ABNORMAL HIGH (ref 4.0–10.5)
nRBC: 0 % (ref 0.0–0.2)

## 2020-02-06 LAB — BASIC METABOLIC PANEL
Anion gap: 13 (ref 5–15)
BUN: 26 mg/dL — ABNORMAL HIGH (ref 8–23)
CO2: 24 mmol/L (ref 22–32)
Calcium: 9.2 mg/dL (ref 8.9–10.3)
Chloride: 97 mmol/L — ABNORMAL LOW (ref 98–111)
Creatinine, Ser: 1.41 mg/dL — ABNORMAL HIGH (ref 0.44–1.00)
GFR calc Af Amer: 42 mL/min — ABNORMAL LOW (ref >60)
GFR calc non Af Amer: 37 mL/min — ABNORMAL LOW (ref >60)
Glucose, Bld: 200 mg/dL — ABNORMAL HIGH (ref 70–99)
Potassium: 4.6 mmol/L (ref 3.5–5.1)
Sodium: 134 mmol/L — ABNORMAL LOW (ref 135–145)

## 2020-02-06 MED ORDER — SODIUM CHLORIDE 0.9% FLUSH: 3.0000 mL | Freq: Once | INTRAVENOUS | Status: DC

## 2020-02-06 NOTE — ED Triage Notes (Signed)
Patient arrives to ED with complaints of ongoing/worsening mid back pain after her fall on 6/16. Pt has had some episodes of incontinence since. Patient pain is getting so severe she cannot do ADLs anymore. Had normal lumbar/thoracic scans as well. Patient was hypotensive in the office today but normotensive today in ED.

## 2020-02-06 NOTE — Progress Notes (Signed)
Crystal Ashley is a 74 y.o. female is here for follow up.  I acted as a Education administrator for [employer], PA-C Anselmo Pickler, LPN   History of Present Illness:   Chief Complaint  Patient presents with  . Follow-up    HPI   ED follow up Saw Dr. Einar Gip 01/29/20 -- was found to have uncontrolled rate with her afib so her norvasc was stopped and she was started diltiazem 180 mg.  She fell on 01/31/20 and went to ER. Found to have normal Lumbar and Thoracic spine. WBC was 15.9.  Pt having mid back pain since fall. She is using ice and taking tylenol -- was not given pain medication in ER. Denies numbness or tingling in legs. Has had episodes of bowel/bladder accidents -- but states that this is only because no one was able to help her get to the bathroom in time, not because of true incontinence.  Every time she walks she has to stop and take a breath, as she is having severe pain. She is total assist with all ADL's since her fall.  Blood pressures at home: BP 6/18: 125/91, 125/87 BP 6/19: 132/97  Vomited on 6/16. No abdominal pain. Has had limited appetite.  Wt Readings from Last 4 Encounters:  01/29/20 184 lb 12.8 oz (83.8 kg)  11/29/19 199 lb (90.3 kg)  06/01/19 184 lb 14.4 oz (83.9 kg)  05/22/19 200 lb (90.7 kg)     BP Readings from Last 3 Encounters:  02/06/20 (!) 86/60  01/31/20 130/88  01/29/20 (!) 113/54     Health Maintenance Due  Topic Date Due  . Fecal DNA (Cologuard)  Never done  . FOOT EXAM  06/15/2019    Past Medical History:  Diagnosis Date  . Cancer ([place])   . Cataract   . Glaucoma   . Hypertension   . Thyroid disease      Social History   Tobacco Use  . Smoking status: Never Smoker  . Smokeless tobacco: Never Used  Vaping Use  . Vaping Use: Never used  Substance Use Topics  . Alcohol use: No  . Drug use: Not Currently    Past Surgical History:  Procedure Laterality Date  . CARDIOVERSION N/A 04/08/2018   Procedure: CARDIOVERSION;   Surgeon: Adrian Prows, MD;  Location: [facility] ENDOSCOPY;  Service: Cardiovascular;  Laterality: N/A;  . CARDIOVERSION N/A 05/22/2019   Procedure: CARDIOVERSION;  Surgeon: Adrian Prows, MD;  Location: [place];  Service: Cardiovascular;  Laterality: N/A;  . FOOT SURGERY Left   . HYSTEROSCOPY     POLYPECTOMY    Family History  Problem Relation Age of Onset  . Diabetes Son   . Healthy Mother   . Healthy Father     PMHx, SurgHx, SocialHx, FamHx, Medications, and Allergies were reviewed in the Visit Navigator and updated as appropriate.   Patient Active Problem List   Diagnosis Date Noted  . Acute on chronic combined systolic and diastolic CHF (congestive heart failure) ([place])   . Acute respiratory failure with hypoxia ([place]) 10/18/2018  . Declining mobility 09/23/2018  . B12 deficiency 09/23/2018  . Morbid obesity with BMI of 40.0-44.9, adult ([place]) 09/23/2018  . Anemia 09/23/2018  . History of cardioversion 04/26/2018  . Type 2 diabetes mellitus without complication, without long-term current use of insulin ([place]) 03/15/2018  . Atrial fibrillation with RVR ([place]) 03/15/2018  . Morbid obesity ([place]) 01/04/2017  . Hyperlipidemia associated with type 2 diabetes mellitus ([place]) 01/04/2017  . Vitamin D  deficiency 12/15/2016  . Glaucoma   . Hypertension associated with diabetes ([place]) 04/19/2014  . Hypothyroidism 04/19/2014  . History of endometrial cancer 03/30/2014    Social History   Tobacco Use  . Smoking status: Never Smoker  . Smokeless tobacco: Never Used  Vaping Use  . Vaping Use: Never used  Substance Use Topics  . Alcohol use: No  . Drug use: Not Currently    Current Medications and Allergies:    Current Outpatient Medications:  .  atorvastatin (LIPITOR) 10 MG tablet, Take 1 tablet (10 mg total) by mouth daily., Disp: 90 tablet, Rfl: 3 .  cholecalciferol (VITAMIN D) 1000 UNITS tablet, Take 5,000 Units by mouth See admin instructions. Sun, Mon, Wed, friday, Disp: , Rfl:  .   cyanocobalamin (,VITAMIN B-12,) 1000 MCG/ML injection, 1000 mcg (1 mg) injection once per week for four weeks, followed by 1000 mcg injection once per month. (Patient taking differently: Inject 1,000 mcg into the muscle every 30 (thirty) days. ), Disp: 30 mL, Rfl: 3 .  diltiazem (CARDIZEM CD) 180 MG 24 hr capsule, Take 1 capsule (180 mg total) by mouth daily., Disp: 30 capsule, Rfl: 2 .  dorzolamide-timolol (COSOPT) 22.3-6.8 MG/ML ophthalmic solution, Place 1 drop into both eyes 2 (two) times daily. , Disp: , Rfl:  .  furosemide (LASIX) 40 MG tablet, Take 1 tablet (40 mg total) by mouth daily., Disp: 90 tablet, Rfl: 1 .  levothyroxine (SYNTHROID) 75 MCG tablet, Take 1 tablet (75 mcg total) by mouth daily before breakfast., Disp: 90 tablet, Rfl: 3 .  metFORMIN (GLUCOPHAGE) 1000 MG tablet, Take 1 tablet (1,000 mg total) by mouth daily with breakfast., Disp: 90 tablet, Rfl: 3 .  metoprolol succinate (TOPROL-XL) 100 MG 24 hr tablet, Take 1 tablet (100 mg total) by mouth daily. Take with or immediately following a meal., Disp: 90 tablet, Rfl: 1 .  Rivaroxaban (XARELTO) 15 MG TABS tablet, Take 1 tablet (15 mg total) by mouth daily with supper. (Patient taking differently: Take 15 mg by mouth daily. ), Disp: 90 tablet, Rfl: 2 .  travoprost, benzalkonium, (TRAVATAN) 0.004 % ophthalmic solution, Place 1 drop into both eyes at bedtime. , Disp: , Rfl:  .  valsartan (DIOVAN) 80 MG tablet, Take 80 mg by mouth daily., Disp: , Rfl:   No Known Allergies  Review of Systems   ROS  Negative unless otherwise specified per HPI.  Vitals:   Vitals:   02/06/20 1514  BP: (!) 86/60  Pulse: 68  Temp: (!) 97.5 F (36.4 C)  TempSrc: Temporal  SpO2: 97%  Height: 5' (1.524 m)     Body mass index is 36.09 kg/m.   Physical Exam:   Physical Exam Vitals and nursing note reviewed.  Constitutional:      General: She is not in acute distress.    Appearance: She is well-developed. She is not ill-appearing or  toxic-appearing.  Cardiovascular:     Rate and Rhythm: Normal rate and regular rhythm.     Pulses: Normal pulses.     Heart sounds: Normal heart sounds, S1 normal and S2 normal.     Comments: No LE edema Pulmonary:     Effort: Pulmonary effort is normal.     Breath sounds: Normal breath sounds.  Skin:    General: Skin is warm and dry.  Neurological:     Mental Status: She is alert.     GCS: GCS eye subscore is 4. GCS verbal subscore is 5. GCS motor subscore is  6.  Psychiatric:        Speech: Speech normal.        Behavior: Behavior normal. Behavior is cooperative.     Assessment and Plan:    Kamy was seen today for follow-up.  Diagnoses and all orders for this visit:  Hypotension, unspecified hypotension type   Due to uncontrolled pain, and significant hypotension, feel patient would best be served in the ER for further evaluation and management. Patient and son in agreement.  . Reviewed expectations re: course of current medical issues. . Discussed self-management of symptoms. . Outlined signs and symptoms indicating need for more acute intervention. . Patient verbalized understanding and all questions were answered. . See orders for this visit as documented in the electronic medical record. . Patient received an After Visit Summary.  CMA or LPN served as scribe during this visit. History, Physical, and Plan performed by medical provider. The above documentation has been reviewed and is accurate and complete.  I spent 25 minutes with this patient, greater than 50% was face-to-face time counseling regarding the above diagnoses.   [name], PA-C [place], [place] 02/06/2020  Follow-up: No follow-ups on file.

## 2020-02-06 NOTE — Telephone Encounter (Signed)
Patient son called and stated that patient went to see [name], PA  And BP was 80/60 and was advised to go back to the ER and patient son is requesting if you can come see her in the ER. Please advise.

## 2020-02-06 NOTE — Patient Instructions (Signed)
It was great to see you!  Please proceed to the ER.  Take care,  [name] PA-C

## 2020-02-07 ENCOUNTER — Emergency Department (HOSPITAL_COMMUNITY): Payer: Medicare Other

## 2020-02-07 ENCOUNTER — Inpatient Hospital Stay (HOSPITAL_COMMUNITY): Payer: Medicare Other

## 2020-02-07 ENCOUNTER — Telehealth: Payer: Self-pay | Admitting: Physician Assistant

## 2020-02-07 DIAGNOSIS — E1122 Type 2 diabetes mellitus with diabetic chronic kidney disease: Secondary | ICD-10-CM | POA: Diagnosis present

## 2020-02-07 DIAGNOSIS — I959 Hypotension, unspecified: Secondary | ICD-10-CM

## 2020-02-07 DIAGNOSIS — N183 Chronic kidney disease, stage 3 unspecified: Secondary | ICD-10-CM

## 2020-02-07 DIAGNOSIS — Z7901 Long term (current) use of anticoagulants: Secondary | ICD-10-CM | POA: Diagnosis not present

## 2020-02-07 DIAGNOSIS — I482 Chronic atrial fibrillation, unspecified: Secondary | ICD-10-CM | POA: Diagnosis present

## 2020-02-07 DIAGNOSIS — R262 Difficulty in walking, not elsewhere classified: Secondary | ICD-10-CM | POA: Diagnosis present

## 2020-02-07 DIAGNOSIS — Z79899 Other long term (current) drug therapy: Secondary | ICD-10-CM | POA: Diagnosis not present

## 2020-02-07 DIAGNOSIS — I5032 Chronic diastolic (congestive) heart failure: Secondary | ICD-10-CM | POA: Diagnosis present

## 2020-02-07 DIAGNOSIS — Z833 Family history of diabetes mellitus: Secondary | ICD-10-CM | POA: Diagnosis not present

## 2020-02-07 DIAGNOSIS — W19XXXA Unspecified fall, initial encounter: Secondary | ICD-10-CM | POA: Diagnosis present

## 2020-02-07 DIAGNOSIS — Z79890 Hormone replacement therapy (postmenopausal): Secondary | ICD-10-CM | POA: Diagnosis not present

## 2020-02-07 DIAGNOSIS — E538 Deficiency of other specified B group vitamins: Secondary | ICD-10-CM | POA: Diagnosis present

## 2020-02-07 DIAGNOSIS — S299XXA Unspecified injury of thorax, initial encounter: Secondary | ICD-10-CM | POA: Diagnosis not present

## 2020-02-07 DIAGNOSIS — Z20822 Contact with and (suspected) exposure to covid-19: Secondary | ICD-10-CM | POA: Diagnosis present

## 2020-02-07 DIAGNOSIS — S22080A Wedge compression fracture of T11-T12 vertebra, initial encounter for closed fracture: Secondary | ICD-10-CM | POA: Diagnosis not present

## 2020-02-07 DIAGNOSIS — M4854XD Collapsed vertebra, not elsewhere classified, thoracic region, subsequent encounter for fracture with routine healing: Secondary | ICD-10-CM | POA: Diagnosis present

## 2020-02-07 DIAGNOSIS — Z7984 Long term (current) use of oral hypoglycemic drugs: Secondary | ICD-10-CM | POA: Diagnosis not present

## 2020-02-07 DIAGNOSIS — E1165 Type 2 diabetes mellitus with hyperglycemia: Secondary | ICD-10-CM | POA: Diagnosis present

## 2020-02-07 DIAGNOSIS — Z6841 Body Mass Index (BMI) 40.0 and over, adult: Secondary | ICD-10-CM | POA: Diagnosis not present

## 2020-02-07 DIAGNOSIS — I9589 Other hypotension: Secondary | ICD-10-CM | POA: Diagnosis not present

## 2020-02-07 DIAGNOSIS — S32810A Multiple fractures of pelvis with stable disruption of pelvic ring, initial encounter for closed fracture: Secondary | ICD-10-CM

## 2020-02-07 DIAGNOSIS — I4821 Permanent atrial fibrillation: Secondary | ICD-10-CM | POA: Diagnosis present

## 2020-02-07 DIAGNOSIS — I10 Essential (primary) hypertension: Secondary | ICD-10-CM | POA: Diagnosis present

## 2020-02-07 DIAGNOSIS — E559 Vitamin D deficiency, unspecified: Secondary | ICD-10-CM | POA: Diagnosis present

## 2020-02-07 DIAGNOSIS — S22089A Unspecified fracture of T11-T12 vertebra, initial encounter for closed fracture: Secondary | ICD-10-CM | POA: Diagnosis present

## 2020-02-07 DIAGNOSIS — W19XXXD Unspecified fall, subsequent encounter: Secondary | ICD-10-CM | POA: Diagnosis present

## 2020-02-07 DIAGNOSIS — S32591A Other specified fracture of right pubis, initial encounter for closed fracture: Principal | ICD-10-CM

## 2020-02-07 DIAGNOSIS — R531 Weakness: Secondary | ICD-10-CM | POA: Diagnosis not present

## 2020-02-07 DIAGNOSIS — S32511A Fracture of superior rim of right pubis, initial encounter for closed fracture: Secondary | ICD-10-CM | POA: Diagnosis not present

## 2020-02-07 DIAGNOSIS — I152 Hypertension secondary to endocrine disorders: Secondary | ICD-10-CM | POA: Diagnosis present

## 2020-02-07 DIAGNOSIS — S32511D Fracture of superior rim of right pubis, subsequent encounter for fracture with routine healing: Secondary | ICD-10-CM | POA: Diagnosis present

## 2020-02-07 DIAGNOSIS — I4891 Unspecified atrial fibrillation: Secondary | ICD-10-CM | POA: Diagnosis not present

## 2020-02-07 DIAGNOSIS — S22079A Unspecified fracture of T9-T10 vertebra, initial encounter for closed fracture: Secondary | ICD-10-CM | POA: Diagnosis present

## 2020-02-07 DIAGNOSIS — H409 Unspecified glaucoma: Secondary | ICD-10-CM | POA: Diagnosis present

## 2020-02-07 DIAGNOSIS — D72829 Elevated white blood cell count, unspecified: Secondary | ICD-10-CM | POA: Diagnosis present

## 2020-02-07 DIAGNOSIS — S3992XA Unspecified injury of lower back, initial encounter: Secondary | ICD-10-CM | POA: Diagnosis not present

## 2020-02-07 DIAGNOSIS — M545 Low back pain: Secondary | ICD-10-CM | POA: Diagnosis not present

## 2020-02-07 DIAGNOSIS — D62 Acute posthemorrhagic anemia: Secondary | ICD-10-CM | POA: Diagnosis present

## 2020-02-07 DIAGNOSIS — E785 Hyperlipidemia, unspecified: Secondary | ICD-10-CM | POA: Diagnosis present

## 2020-02-07 DIAGNOSIS — M8008XA Age-related osteoporosis with current pathological fracture, vertebra(e), initial encounter for fracture: Secondary | ICD-10-CM | POA: Diagnosis not present

## 2020-02-07 DIAGNOSIS — Z6837 Body mass index (BMI) 37.0-37.9, adult: Secondary | ICD-10-CM | POA: Diagnosis not present

## 2020-02-07 DIAGNOSIS — Z66 Do not resuscitate: Secondary | ICD-10-CM | POA: Diagnosis present

## 2020-02-07 DIAGNOSIS — E860 Dehydration: Secondary | ICD-10-CM | POA: Diagnosis present

## 2020-02-07 DIAGNOSIS — T07XXXA Unspecified multiple injuries, initial encounter: Secondary | ICD-10-CM | POA: Diagnosis not present

## 2020-02-07 DIAGNOSIS — E669 Obesity, unspecified: Secondary | ICD-10-CM | POA: Diagnosis present

## 2020-02-07 DIAGNOSIS — N179 Acute kidney failure, unspecified: Secondary | ICD-10-CM | POA: Diagnosis present

## 2020-02-07 DIAGNOSIS — M4326 Fusion of spine, lumbar region: Secondary | ICD-10-CM | POA: Diagnosis present

## 2020-02-07 DIAGNOSIS — N1831 Chronic kidney disease, stage 3a: Secondary | ICD-10-CM | POA: Diagnosis present

## 2020-02-07 DIAGNOSIS — M546 Pain in thoracic spine: Secondary | ICD-10-CM | POA: Diagnosis not present

## 2020-02-07 DIAGNOSIS — E039 Hypothyroidism, unspecified: Secondary | ICD-10-CM | POA: Diagnosis present

## 2020-02-07 DIAGNOSIS — S199XXA Unspecified injury of neck, initial encounter: Secondary | ICD-10-CM | POA: Diagnosis not present

## 2020-02-07 DIAGNOSIS — E1169 Type 2 diabetes mellitus with other specified complication: Secondary | ICD-10-CM | POA: Diagnosis present

## 2020-02-07 DIAGNOSIS — I480 Paroxysmal atrial fibrillation: Secondary | ICD-10-CM | POA: Diagnosis present

## 2020-02-07 DIAGNOSIS — S22070A Wedge compression fracture of T9-T10 vertebra, initial encounter for closed fracture: Secondary | ICD-10-CM | POA: Diagnosis not present

## 2020-02-07 LAB — CBG MONITORING, ED
Glucose-Capillary: 152 mg/dL — ABNORMAL HIGH (ref 70–99)
Glucose-Capillary: 195 mg/dL — ABNORMAL HIGH (ref 70–99)
Glucose-Capillary: 131 mg/dL — ABNORMAL HIGH (ref 70–99)

## 2020-02-07 LAB — BRAIN NATRIURETIC PEPTIDE: B Natriuretic Peptide: 171.1 pg/mL — ABNORMAL HIGH (ref 0.0–100.0)

## 2020-02-07 LAB — URINALYSIS, ROUTINE W REFLEX MICROSCOPIC
Bilirubin Urine: NEGATIVE
Glucose, UA: NEGATIVE mg/dL
Hgb urine dipstick: NEGATIVE
Ketones, ur: NEGATIVE mg/dL
Leukocytes,Ua: NEGATIVE
Nitrite: NEGATIVE
Protein, ur: NEGATIVE mg/dL
Specific Gravity, Urine: 1.019 (ref 1.005–1.030)
pH: 5 (ref 5.0–8.0)

## 2020-02-07 LAB — GLUCOSE, CAPILLARY: Glucose-Capillary: 179 mg/dL — ABNORMAL HIGH (ref 70–99)

## 2020-02-07 LAB — SARS CORONAVIRUS 2 BY RT PCR (HOSPITAL ORDER, PERFORMED IN ~~LOC~~ HOSPITAL LAB): SARS Coronavirus 2: NEGATIVE

## 2020-02-07 IMAGING — CT CT L SPINE W/O CM
2 of 13 series · 7 of 33 positions shown, 8 images · non-contrast
Comparison: Thoracic and lumbar spine radiographs [DATE]

CLINICAL DATA: Back pain after a fall.

EXAM:
CT THORACIC AND LUMBAR SPINE WITHOUT CONTRAST
TECHNIQUE: Multidetector CT imaging of the thoracic and lumbar spine was
performed without contrast. Multiplanar CT image reconstructions
were also generated.

[Series 24: t-spine 1.0 st t8-t10 · axial · 0.36mm/px · z∈[+1230,+1262]mm · 2 of 194 slices shown, 3 images]
[im 65/194  soft-tissue]
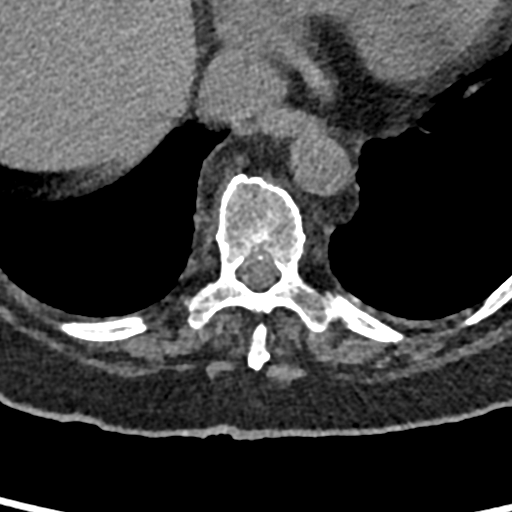
[im 65/194  bone]
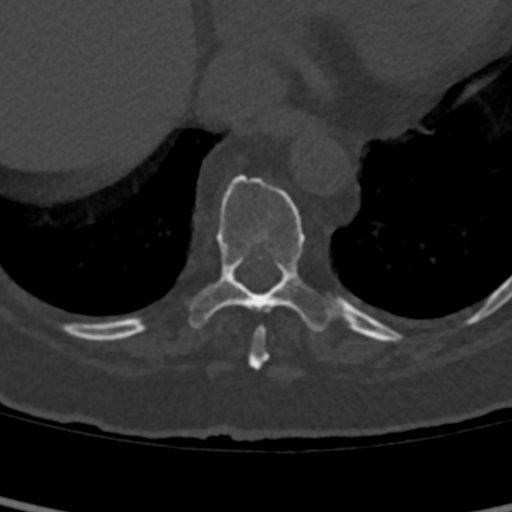
[im 129/194  bone]
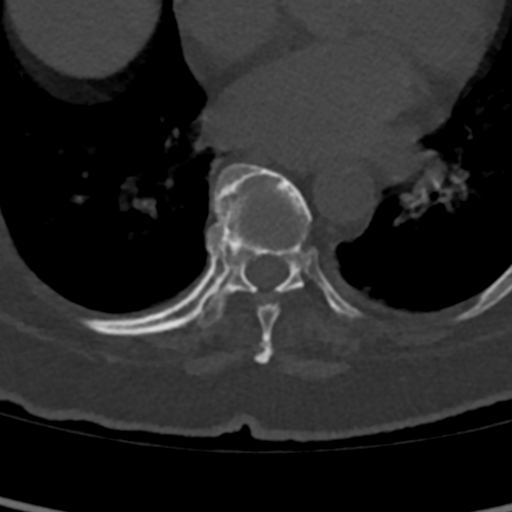

[Series 25: t-10 sagittals · sagittal · 0.19mm/px · 5 of 364 slices shown]
[im 61/364  bone]
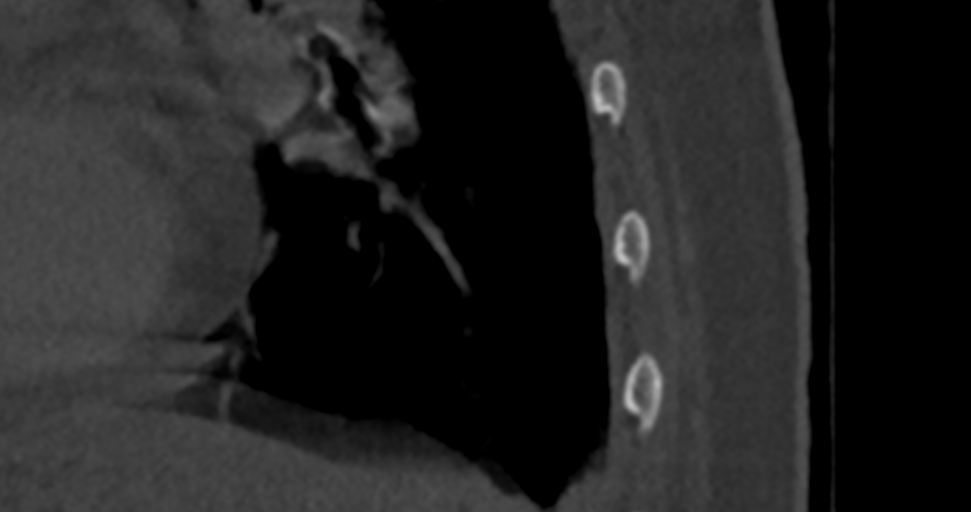
[im 122/364  bone]
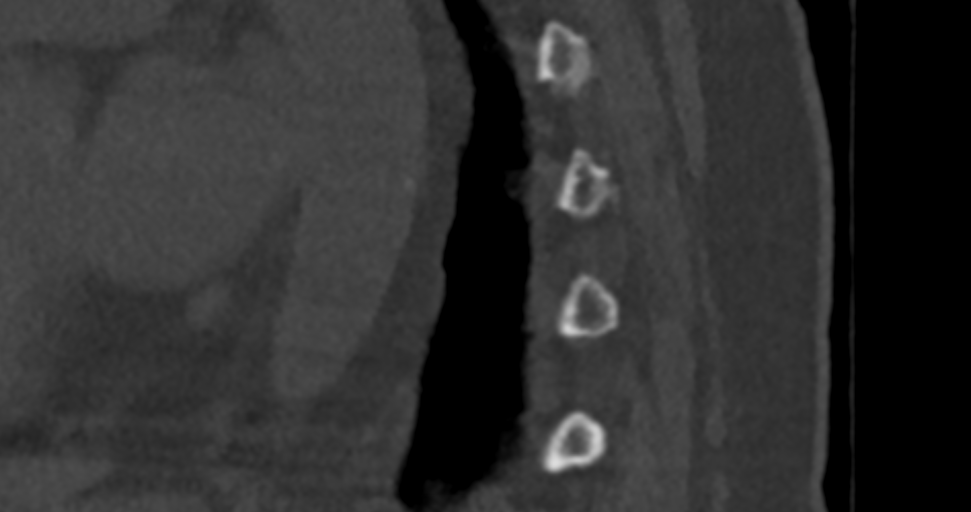
[im 182/364  bone]
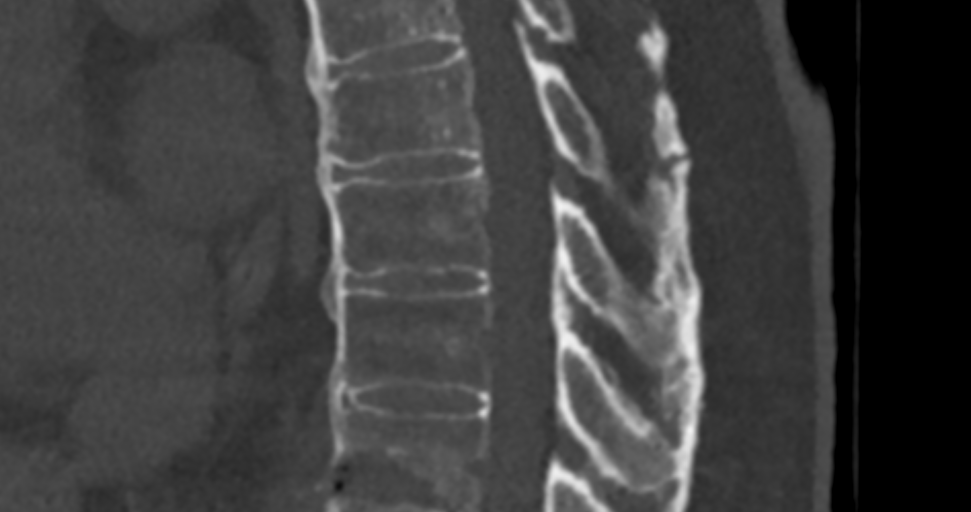
[im 243/364  bone]
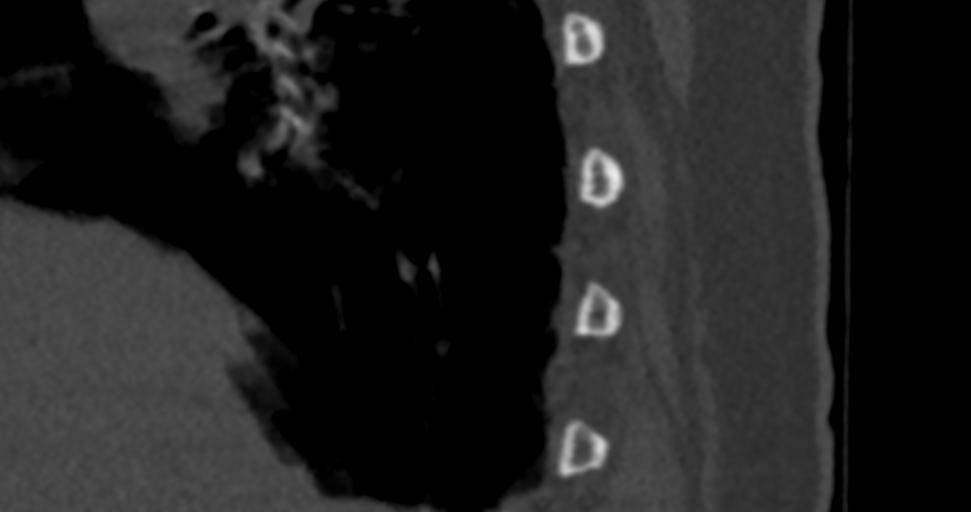
[im 303/364  bone]
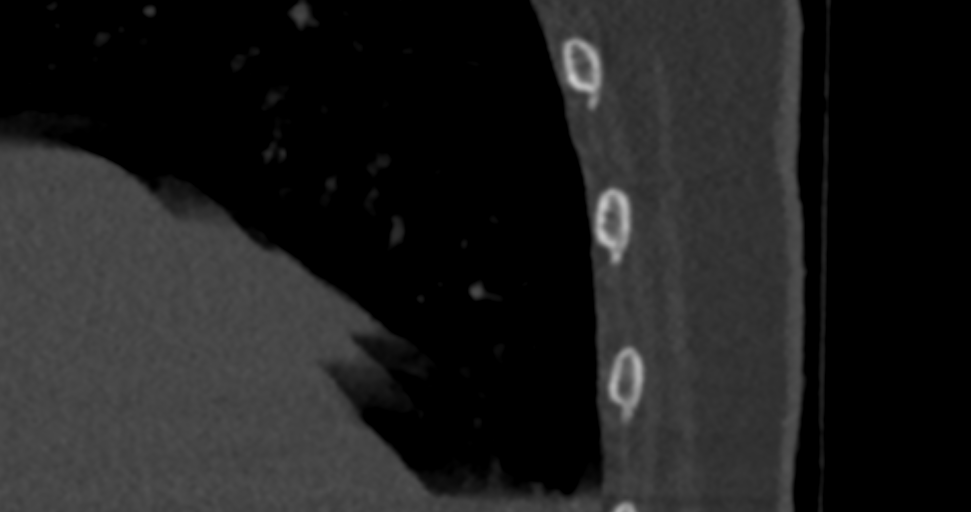

[7 of 33 positions shown; findings below may reference images not displayed]

FINDINGS: CT THORACIC SPINE FINDINGS

Alignment: No listhesis.

Vertebrae: Diffuse ankylosis of the thoracic spine by bridging
syndesmophytes. Diffuse supraspinous ligament ossification from T3
to T12 with focal discontinuity at T7-8 which has a chronic
appearance. There is a horizontal fracture through the inferior
portion of the T10 vertebral body with 8 mm of distraction
anteriorly. There is also a fracture of the T11 superior endplate
with slight vertebral body height loss and with extension into the
pedicles.

Paraspinal and other soft tissues: Paravertebral soft tissue
edema/hematoma at T10 and T11.

Disc levels: No evidence of significant thoracic spinal canal
stenosis. Bilateral neural foraminal stenosis at T10-11 due to facet
spurring. Incompletely evaluated cervical spondylosis.

CT LUMBAR SPINE FINDINGS

Segmentation: 5 lumbar type vertebrae.

Alignment: Normal.

Vertebrae: Chronic mild L1 superior endplate compression fracture.
Interbody ankylosis at L1-2 and L5-S1.

Paraspinal and other soft tissues: No acute finding.

Disc levels: Disc bulging, endplate spurring, and facet arthrosis
resulting in mild spinal stenosis at L3-4 and moderate spinal
stenosis at L4-5 as well as mild-to-moderate neural foraminal
stenosis at L4-5 and L5-S1.
IMPRESSION: 1. Acute T10 and T11 vertebral fractures as above in the setting of
diffuse thoracic ankylosis (BLONDINACKA stick fracture).
2. Lumbar spondylosis with moderate spinal stenosis at L4-5.
3. Chronic mild L1 compression fracture.

## 2020-02-07 IMAGING — US US RENAL
1 series · 14 of 25 positions shown · non-contrast
Comparison: None

CLINICAL DATA: Stage III chronic kidney disease, history
hypertension, atrial fibrillation, type II diabetes mellitus

EXAM:
RENAL / URINARY TRACT ULTRASOUND COMPLETE

[Series 1: us renal · 14 of 38 slices shown]
[im 1/38]
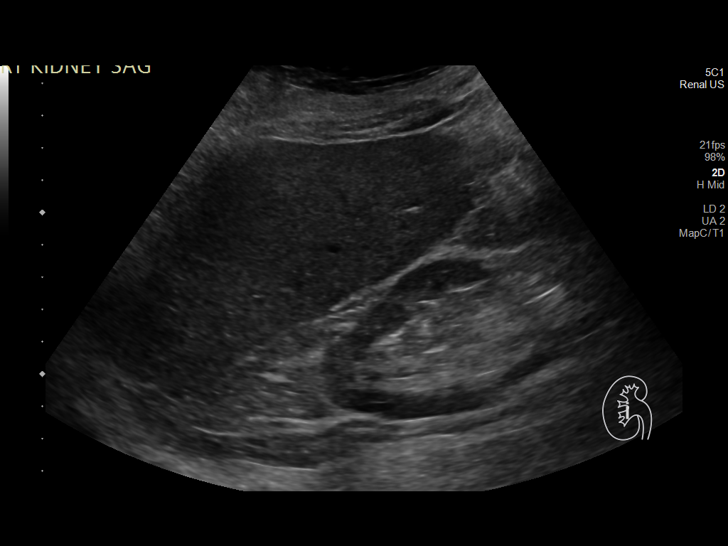
[im 4/38]
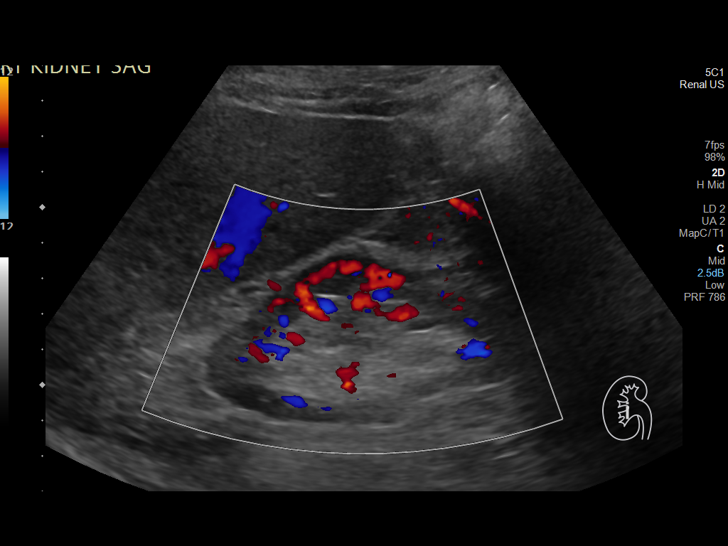
[im 7/38]
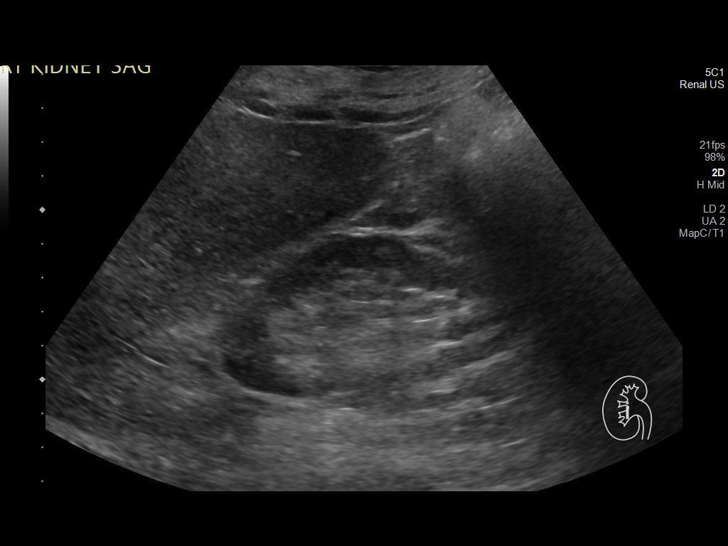
[im 10/38]
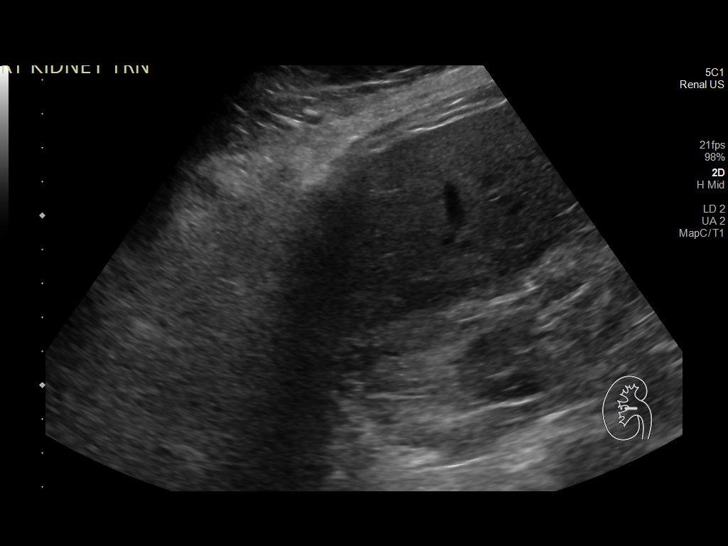
[im 13/38]
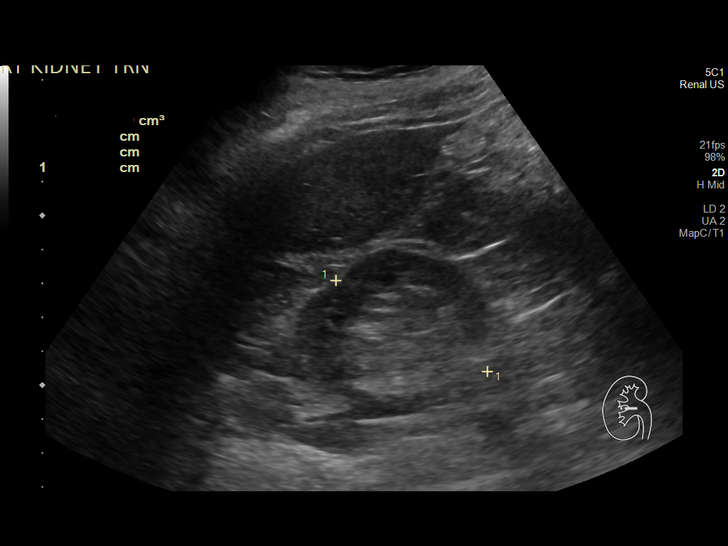
[im 14/38]
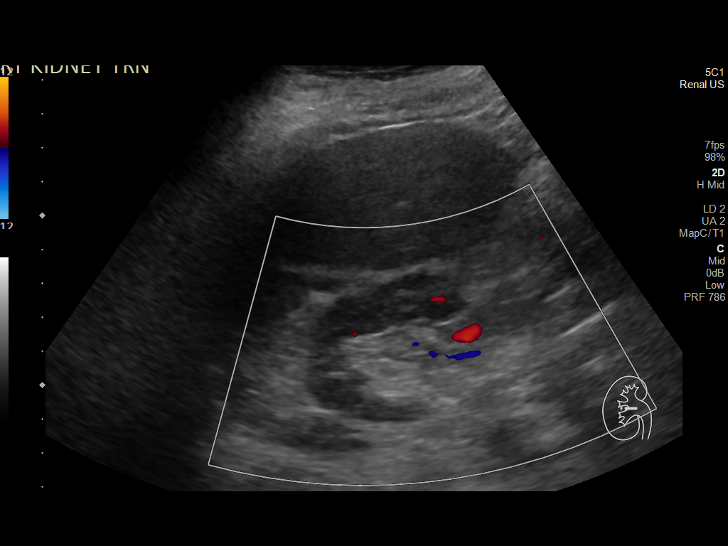
[im 17/38]
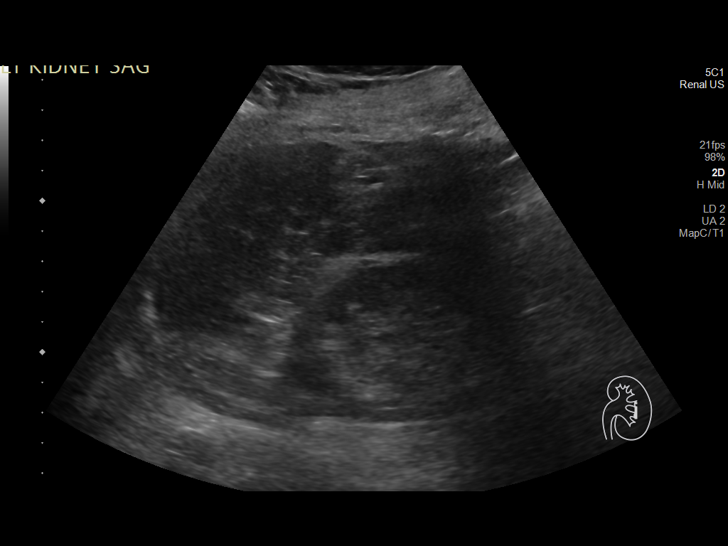
[im 21/38]
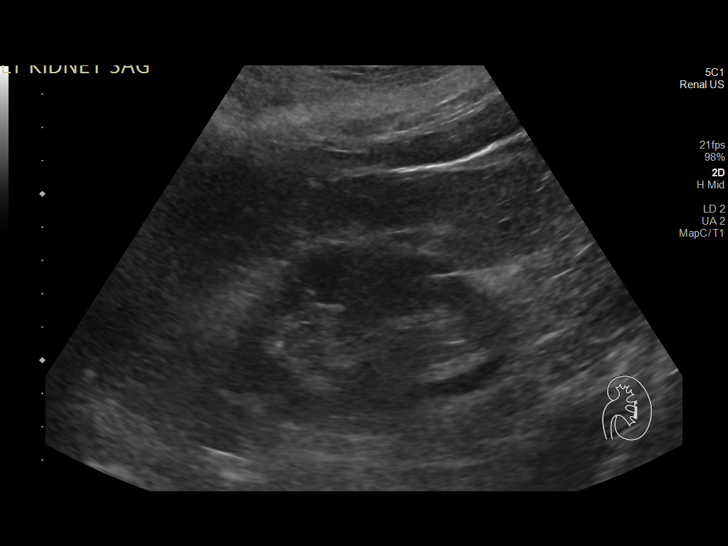
[im 24/38]
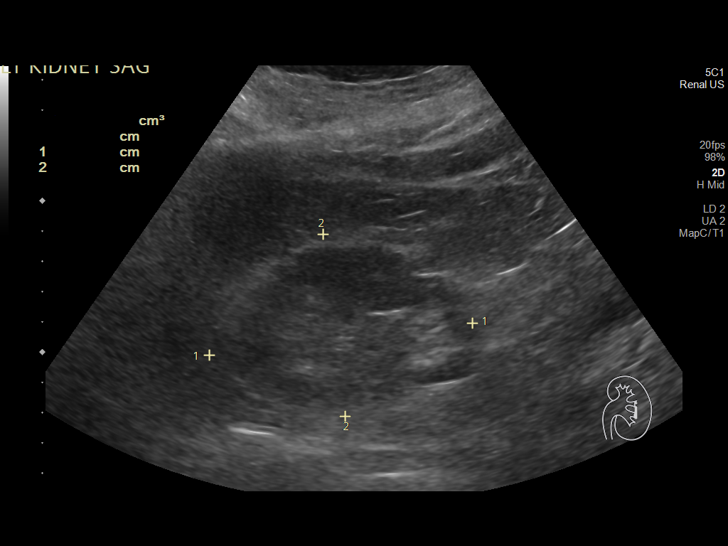
[im 25/38]
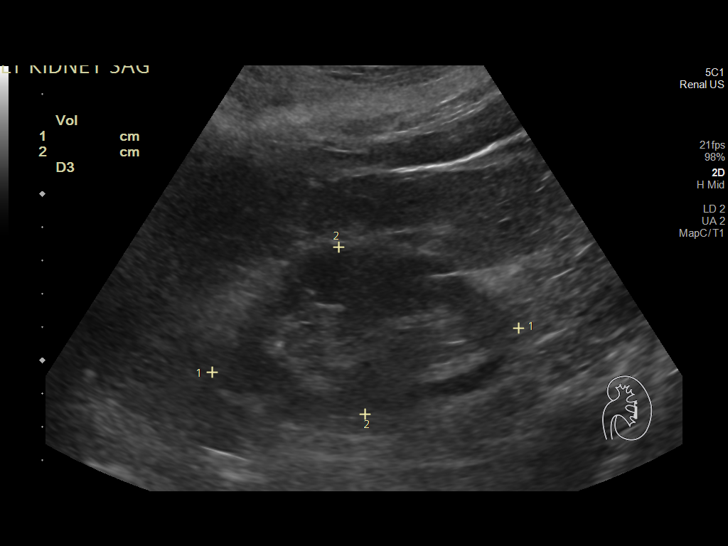
[im 28/38]
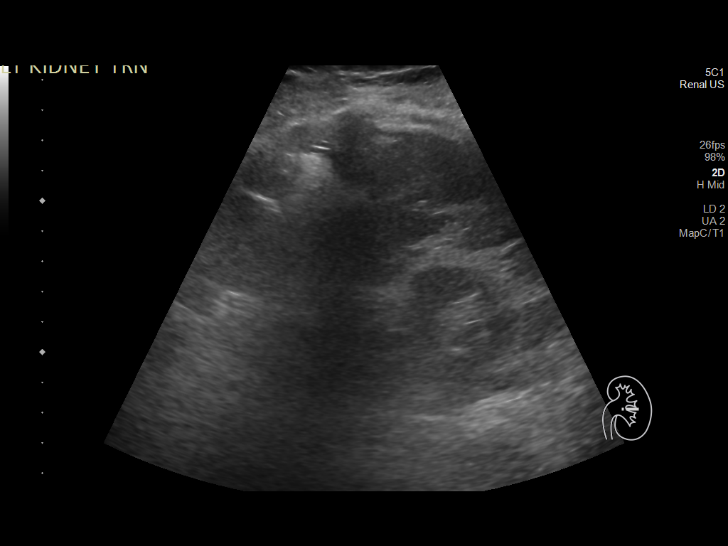
[im 31/38]
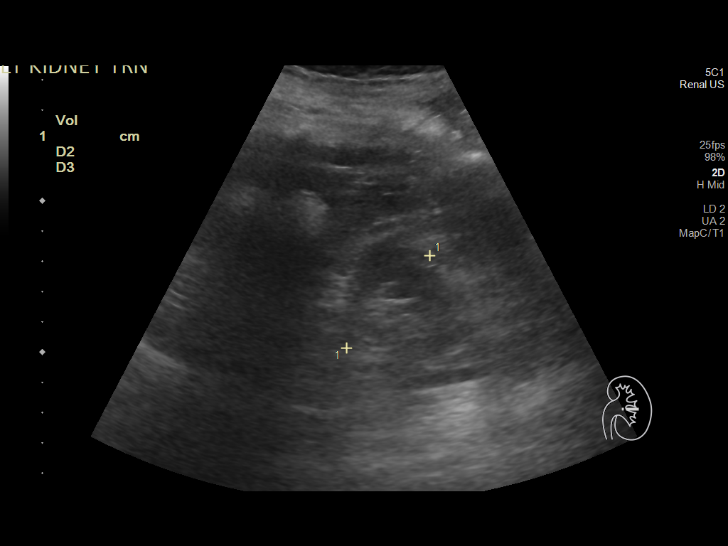
[im 34/38]
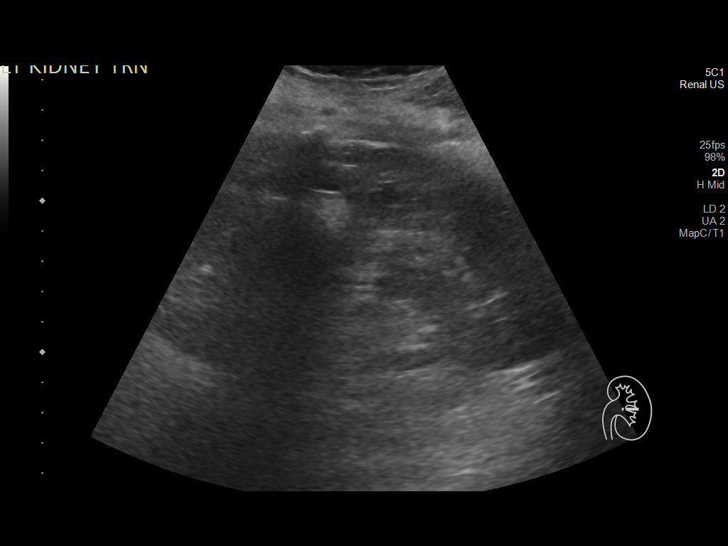
[im 38/38]
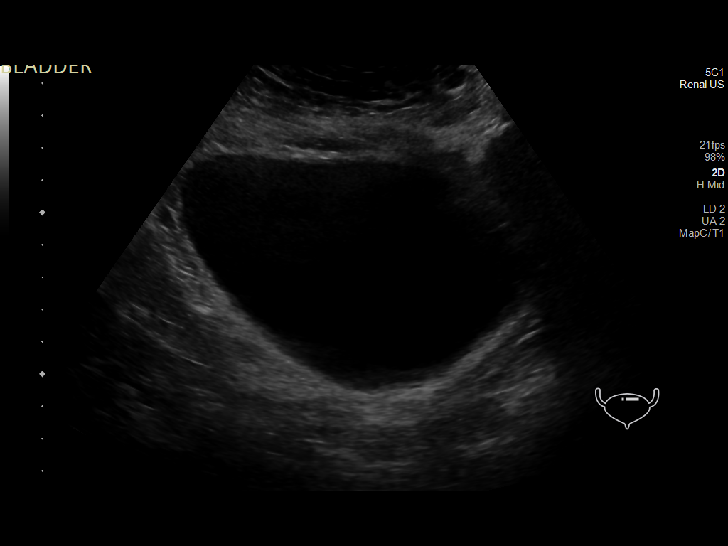

[14 of 25 positions shown; findings below may reference images not displayed]

FINDINGS: Right Kidney:

Renal measurements: 9.2 x 5.0 x 5.2 cm = volume: 124 mL. Cortical
thinning. Normal cortical echogenicity. No mass, hydronephrosis, or
shadowing calcification. Small amount of nonspecific perinephric
fluid.

Left Kidney:

Renal measurements: 8.8 x 6.1 x 4.1 cm = volume: 114 mL. Cortical
thinning. Normal cortical echogenicity. No mass, hydronephrosis or
shadowing calcification.

Bladder:

Appears normal for degree of bladder distention.

Other:

None.
IMPRESSION: BILATERAL renal cortical atrophy.

Small amount of nonspecific RIGHT perinephric fluid.

No evidence of renal mass or hydronephrosis.

## 2020-02-07 IMAGING — CT CT PELVIS W/O CM
3 of 5 series · 12 of 33 positions shown, 15 images · non-contrast
Comparison: None.
COMPARISON: None.

Addendum:
CLINICAL DATA: Lower back pain.

EXAM:
CT PELVIS WITHOUT CONTRAST
TECHNIQUE: Multidetector CT imaging of the pelvis was performed following the
standard protocol without intravenous contrast.

[Series 7: pelvis thin · axial · 0.98mm/px · z∈[+842,+1030]mm · 6 of 394 slices shown, 8 images]
[im 40/394  soft-tissue]
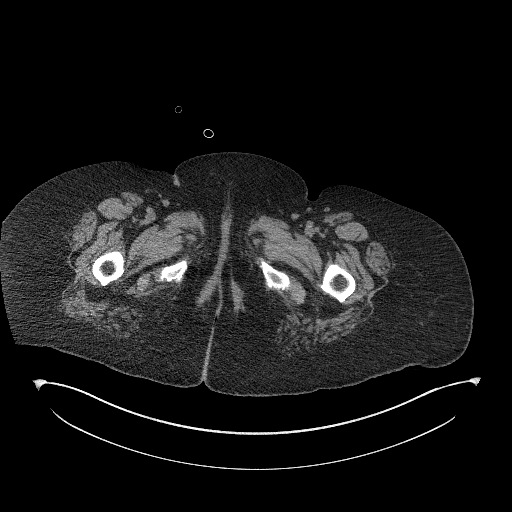
[im 40/394  bone]
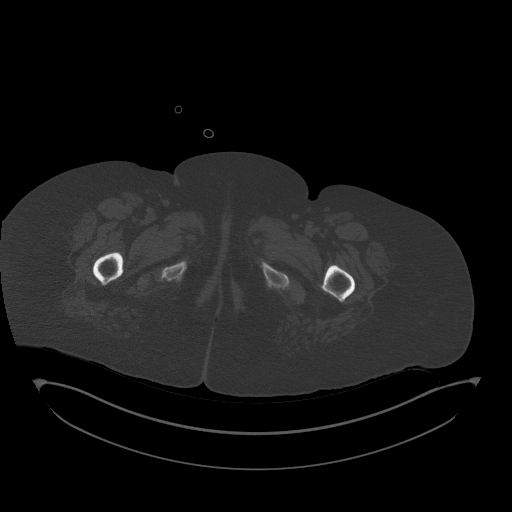
[im 118/394  bone]
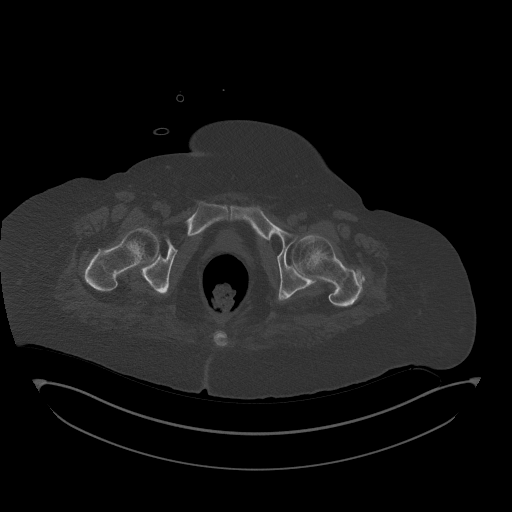
[im 158/394  bone]
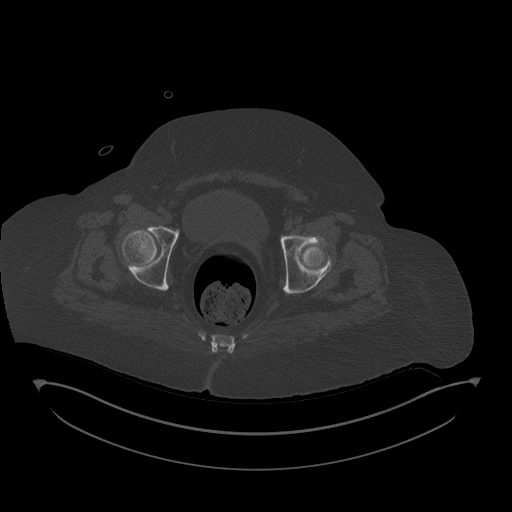
[im 236/394  bone]
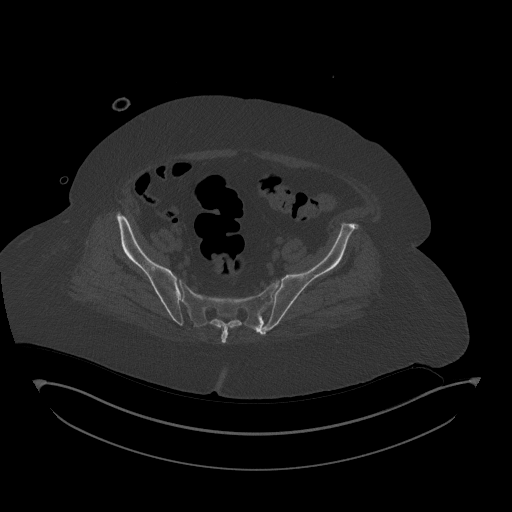
[im 276/394  soft-tissue]
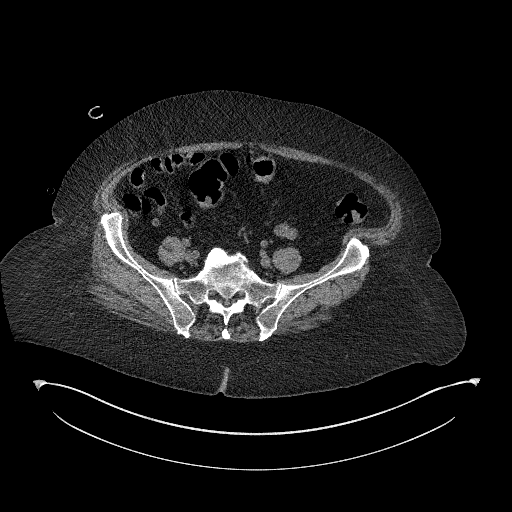
[im 276/394  bone]
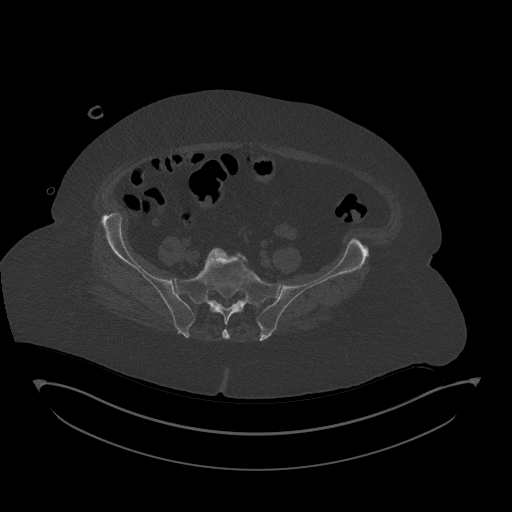
[im 354/394  bone]
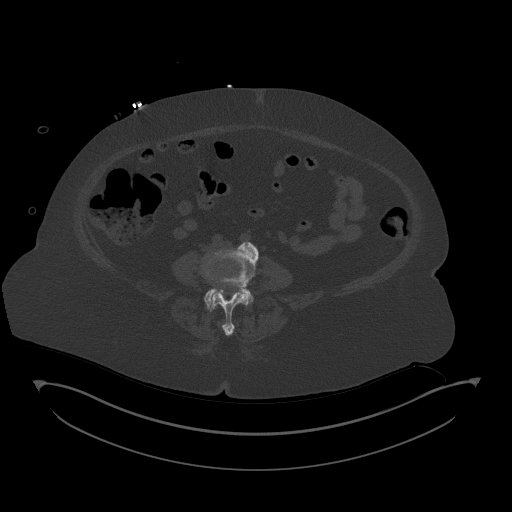

[Series 8: coronal bone · coronal · 0.39mm/px · 1 of 188 slices shown]
[im 94/188  bone]
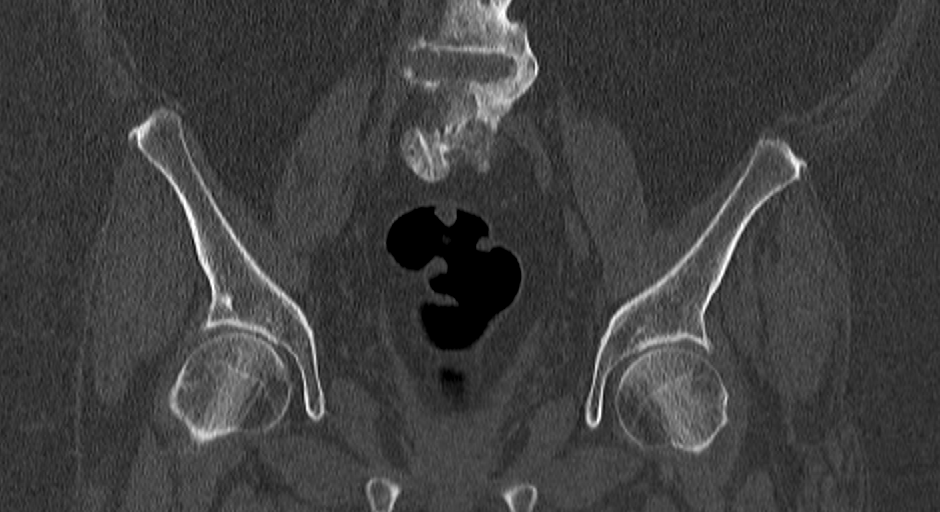

[Series 11: sagittal st · sagittal · 0.49mm/px · 5 of 191 slices shown, 6 images]
[im 64/191  bone]
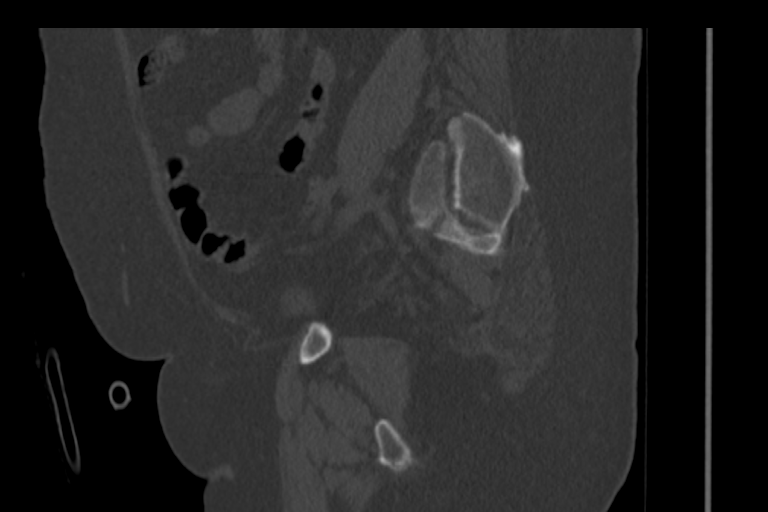
[im 80/191  bone]
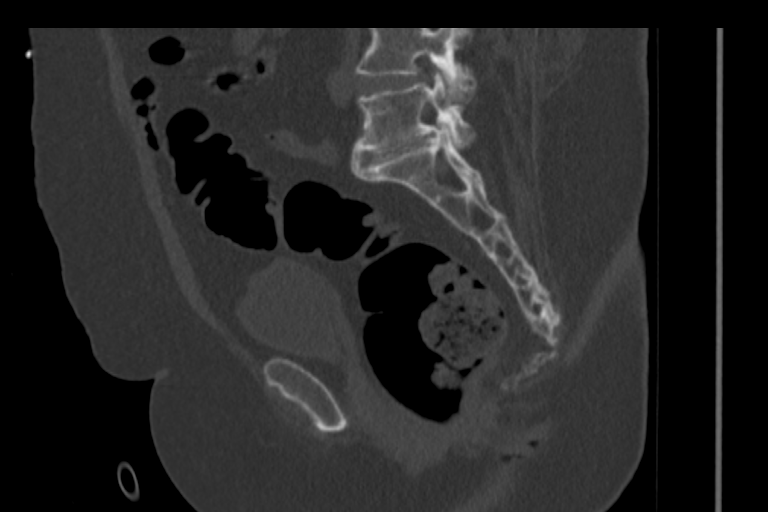
[im 96/191  soft-tissue]
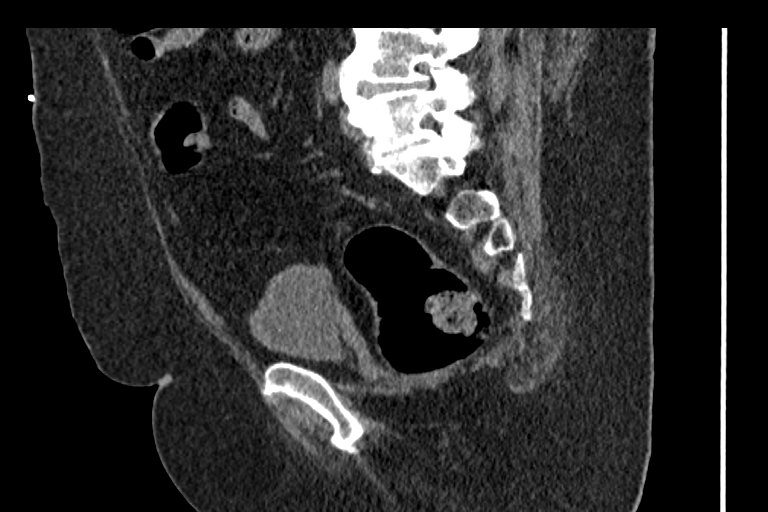
[im 96/191  bone]
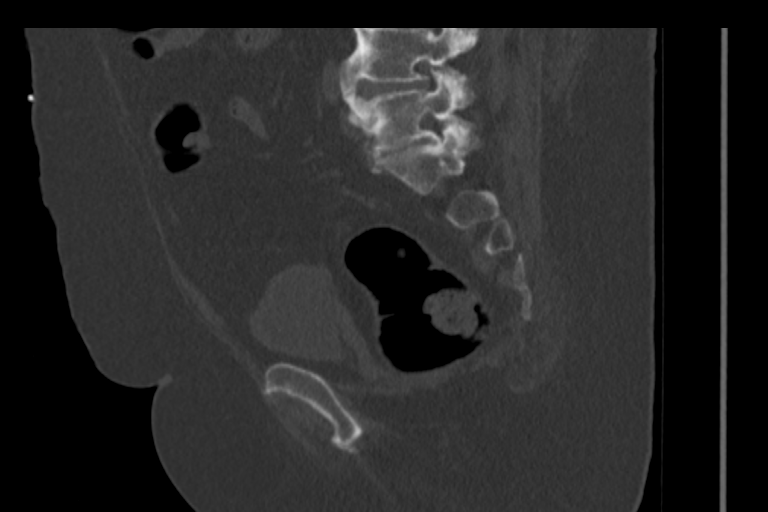
[im 111/191  bone]
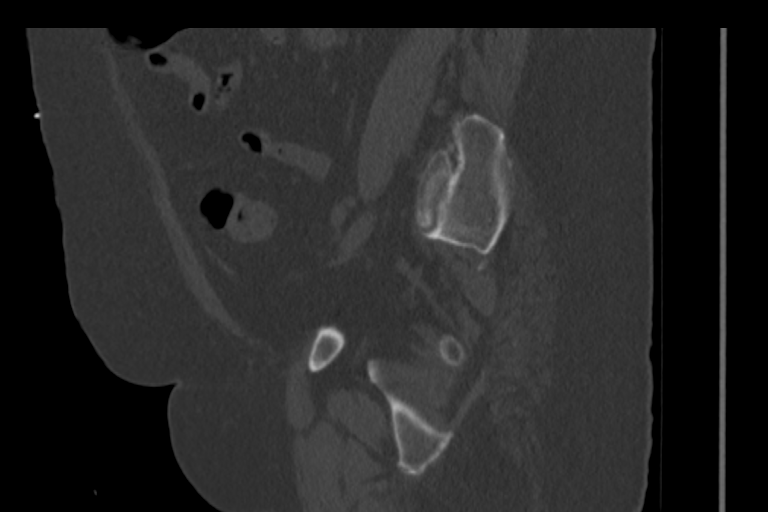
[im 127/191  bone]
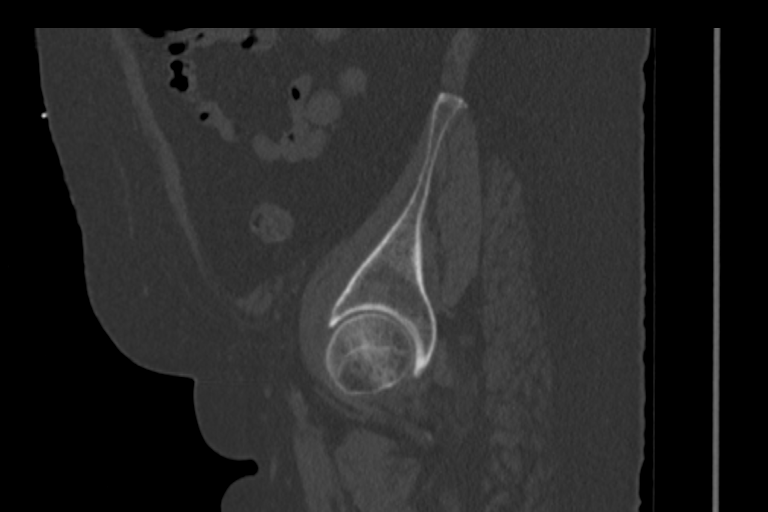

[12 of 33 positions shown; findings below may reference images not displayed]

FINDINGS: Urinary Tract:  No abnormality visualized.

Bowel:  Unremarkable visualized pelvic bowel loops.

Vascular/Lymphatic: No pathologically enlarged lymph nodes. No
significant vascular abnormality seen.

Reproductive:  The uterus is surgically absent.

Other:  None.

Musculoskeletal: Marked severity degenerative changes are seen at
the level of L5-S1.
IMPRESSION: Marked severity degenerative changes at the level of L5-S1.

ADDENDUM:
A very small, ill-defined area of cortical irregularity is seen
along the anterolateral corner of the symphysis pubis on the right
(axial CT image 86 and 87, CT series number 4). A small fracture of
indeterminate age cannot be excluded. MRI correlation is recommended
if acute fracture is of clinical concern.

*** End of Addendum ***
FINDINGS: Urinary Tract:  No abnormality visualized.

Bowel:  Unremarkable visualized pelvic bowel loops.

Vascular/Lymphatic: No pathologically enlarged lymph nodes. No
significant vascular abnormality seen.

Reproductive:  The uterus is surgically absent.

Other:  None.

Musculoskeletal: Marked severity degenerative changes are seen at
the level of L5-S1.
IMPRESSION: Marked severity degenerative changes at the level of L5-S1.

## 2020-02-07 IMAGING — DX DG HIP (WITH OR WITHOUT PELVIS) 2-3V*R*
3 series · 3 of 3 positions shown · non-contrast
Comparison: None.

CLINICAL DATA: Right-sided pain after fall 1 week ago.

EXAM:
DG HIP (WITH OR WITHOUT PELVIS) 2-3V RIGHT

[pelvis ap]
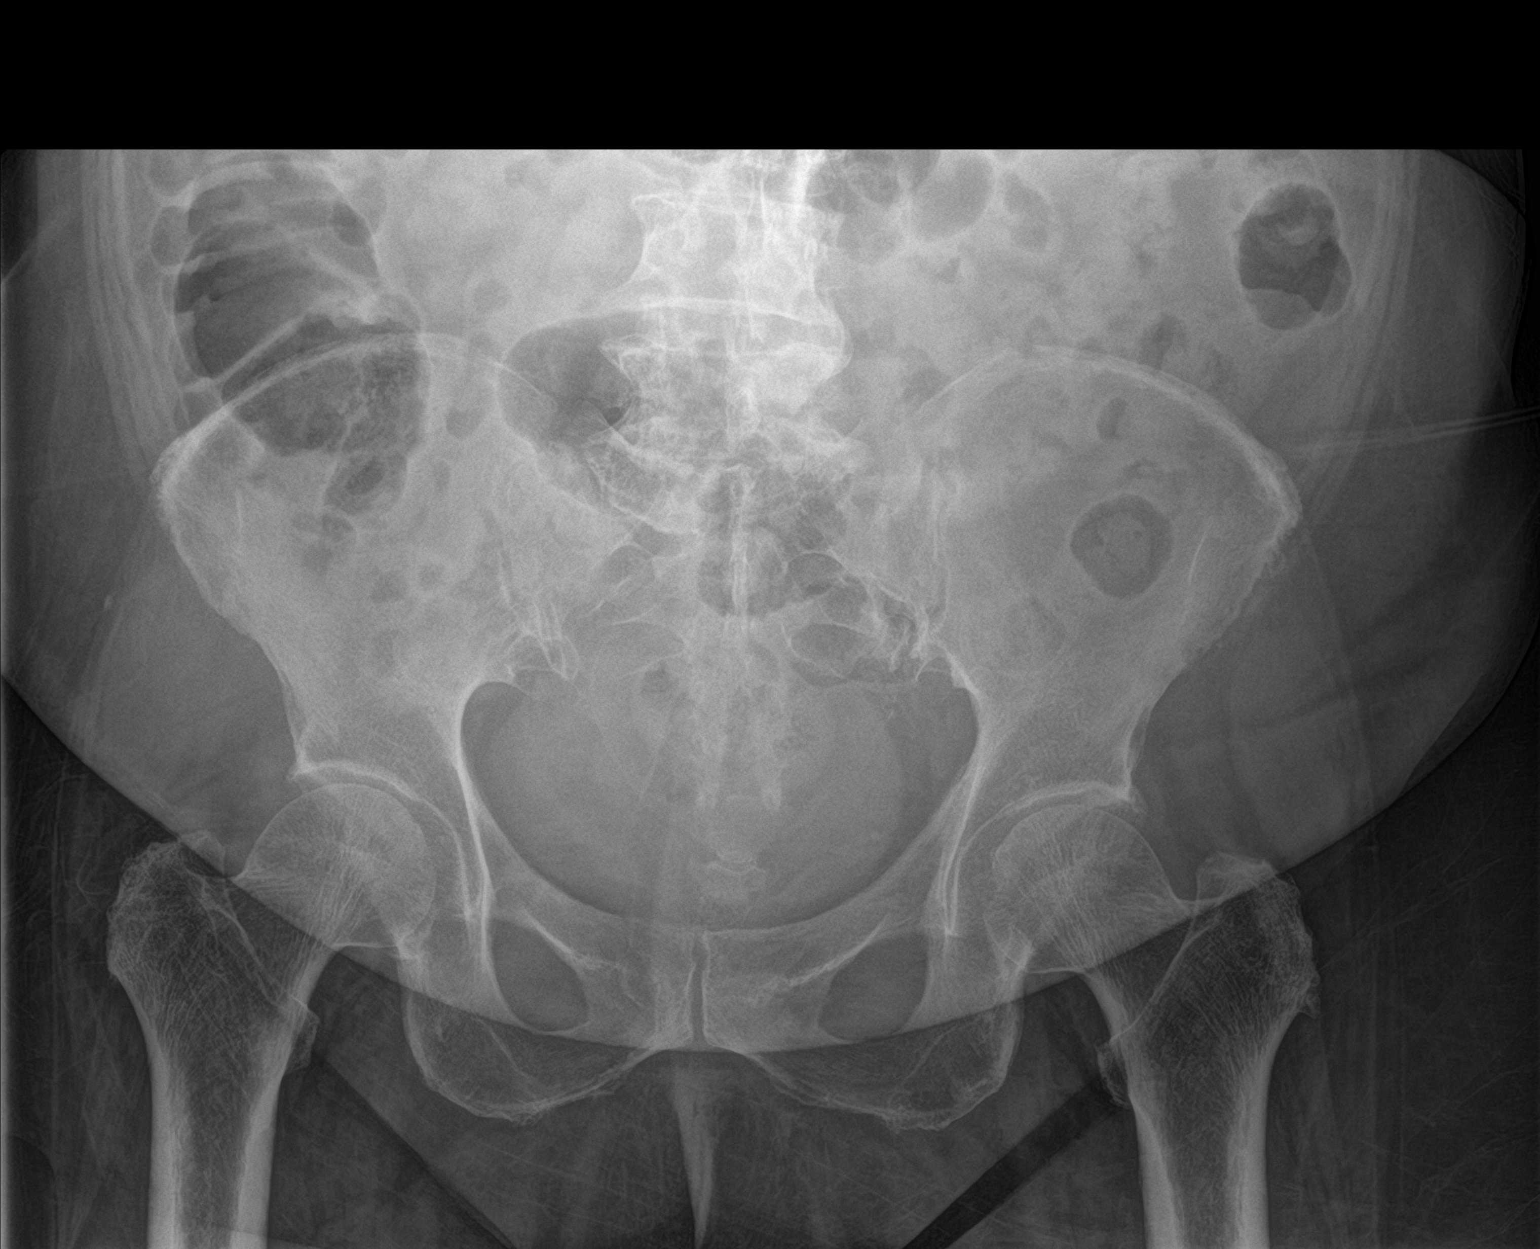

[hip ap]
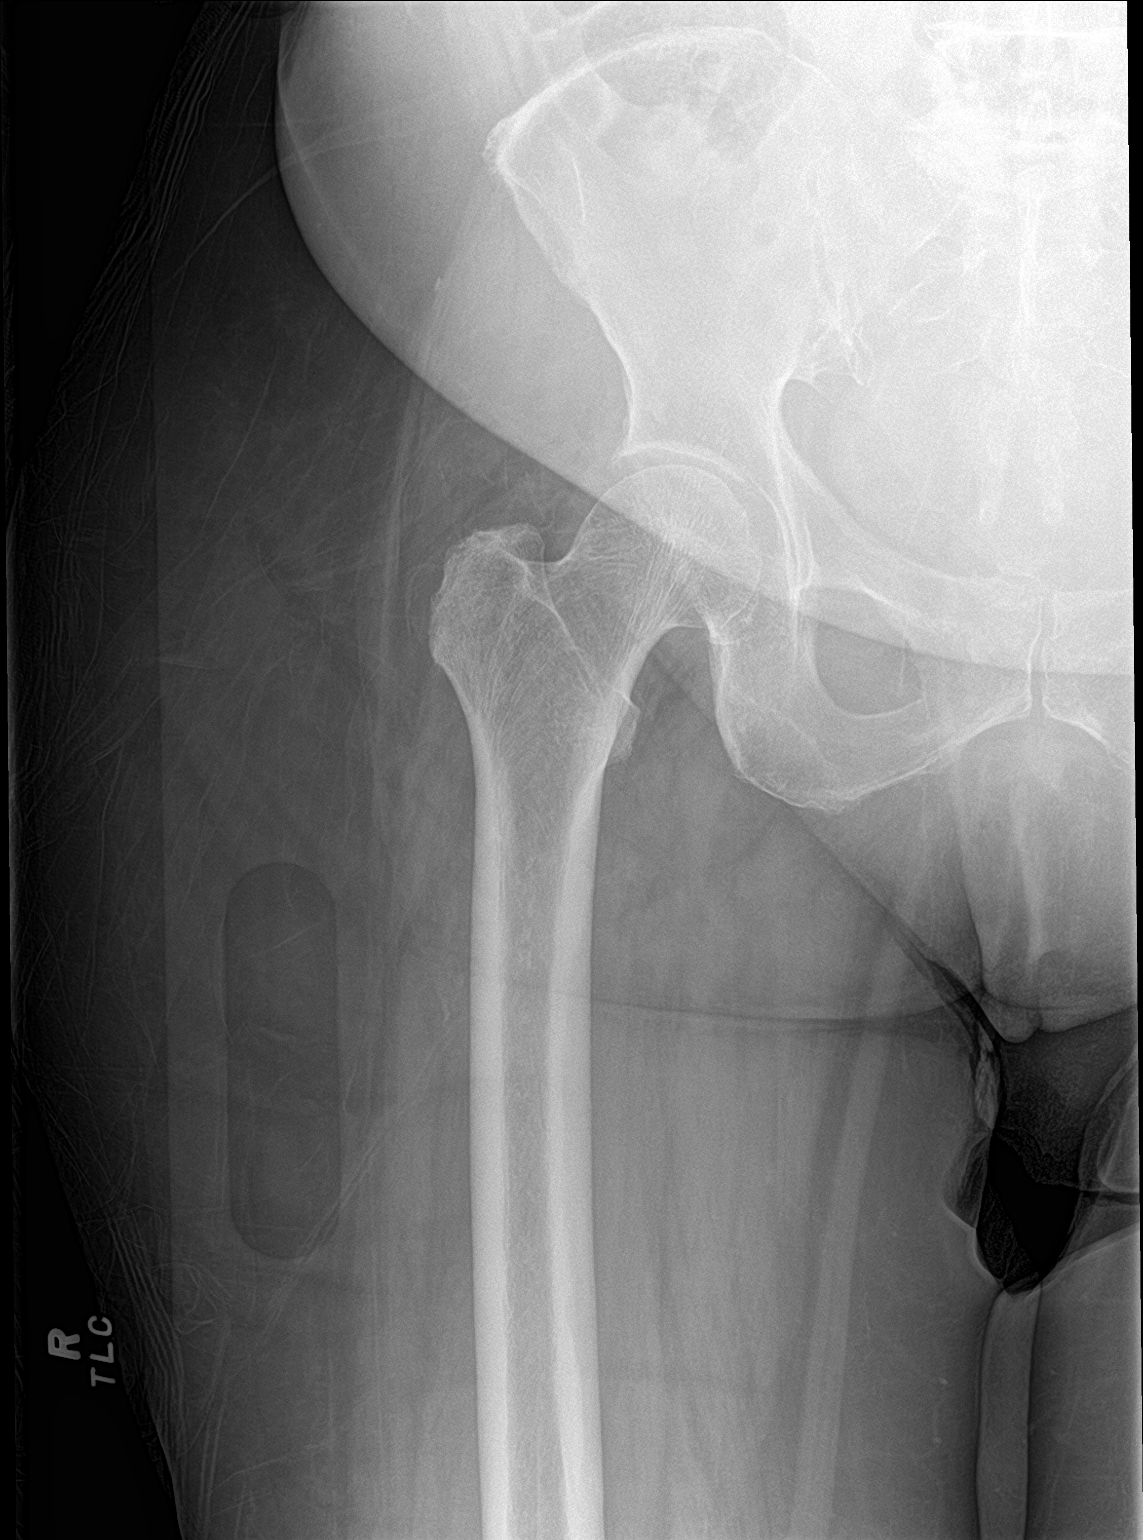

[hip lat]
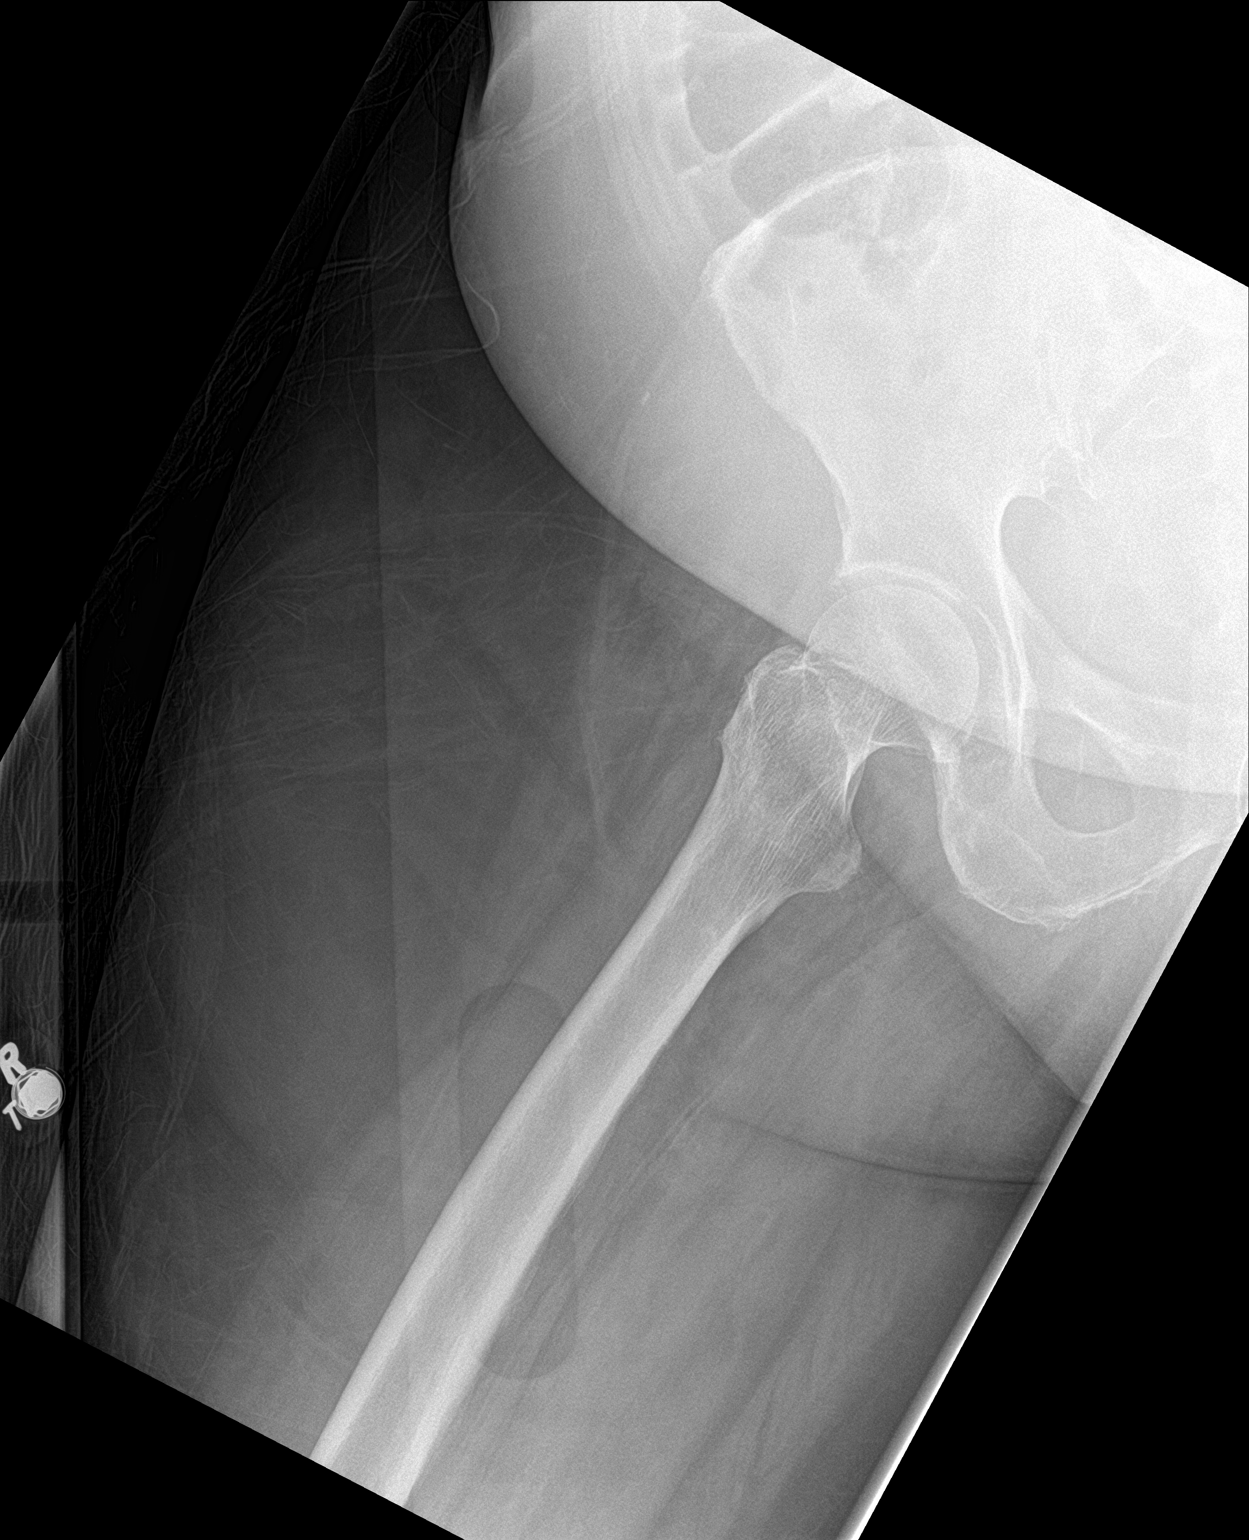

[3 of 3 positions shown; findings below may reference images not displayed]

FINDINGS: Nondisplaced right superior and inferior pubic rami fractures are
present. The hip is located. No acute femoral fracture is present.
IMPRESSION: Nondisplaced right superior and inferior pubic rami fractures.

## 2020-02-07 IMAGING — DX DG CHEST 2V
2 series · 2 of 2 positions shown · non-contrast
Comparison: [DATE]

CLINICAL DATA: Trauma secondary to a fall. Neck tenderness.
Right-sided pelvic pain.

EXAM:
CHEST - 2 VIEW

[chest lat]
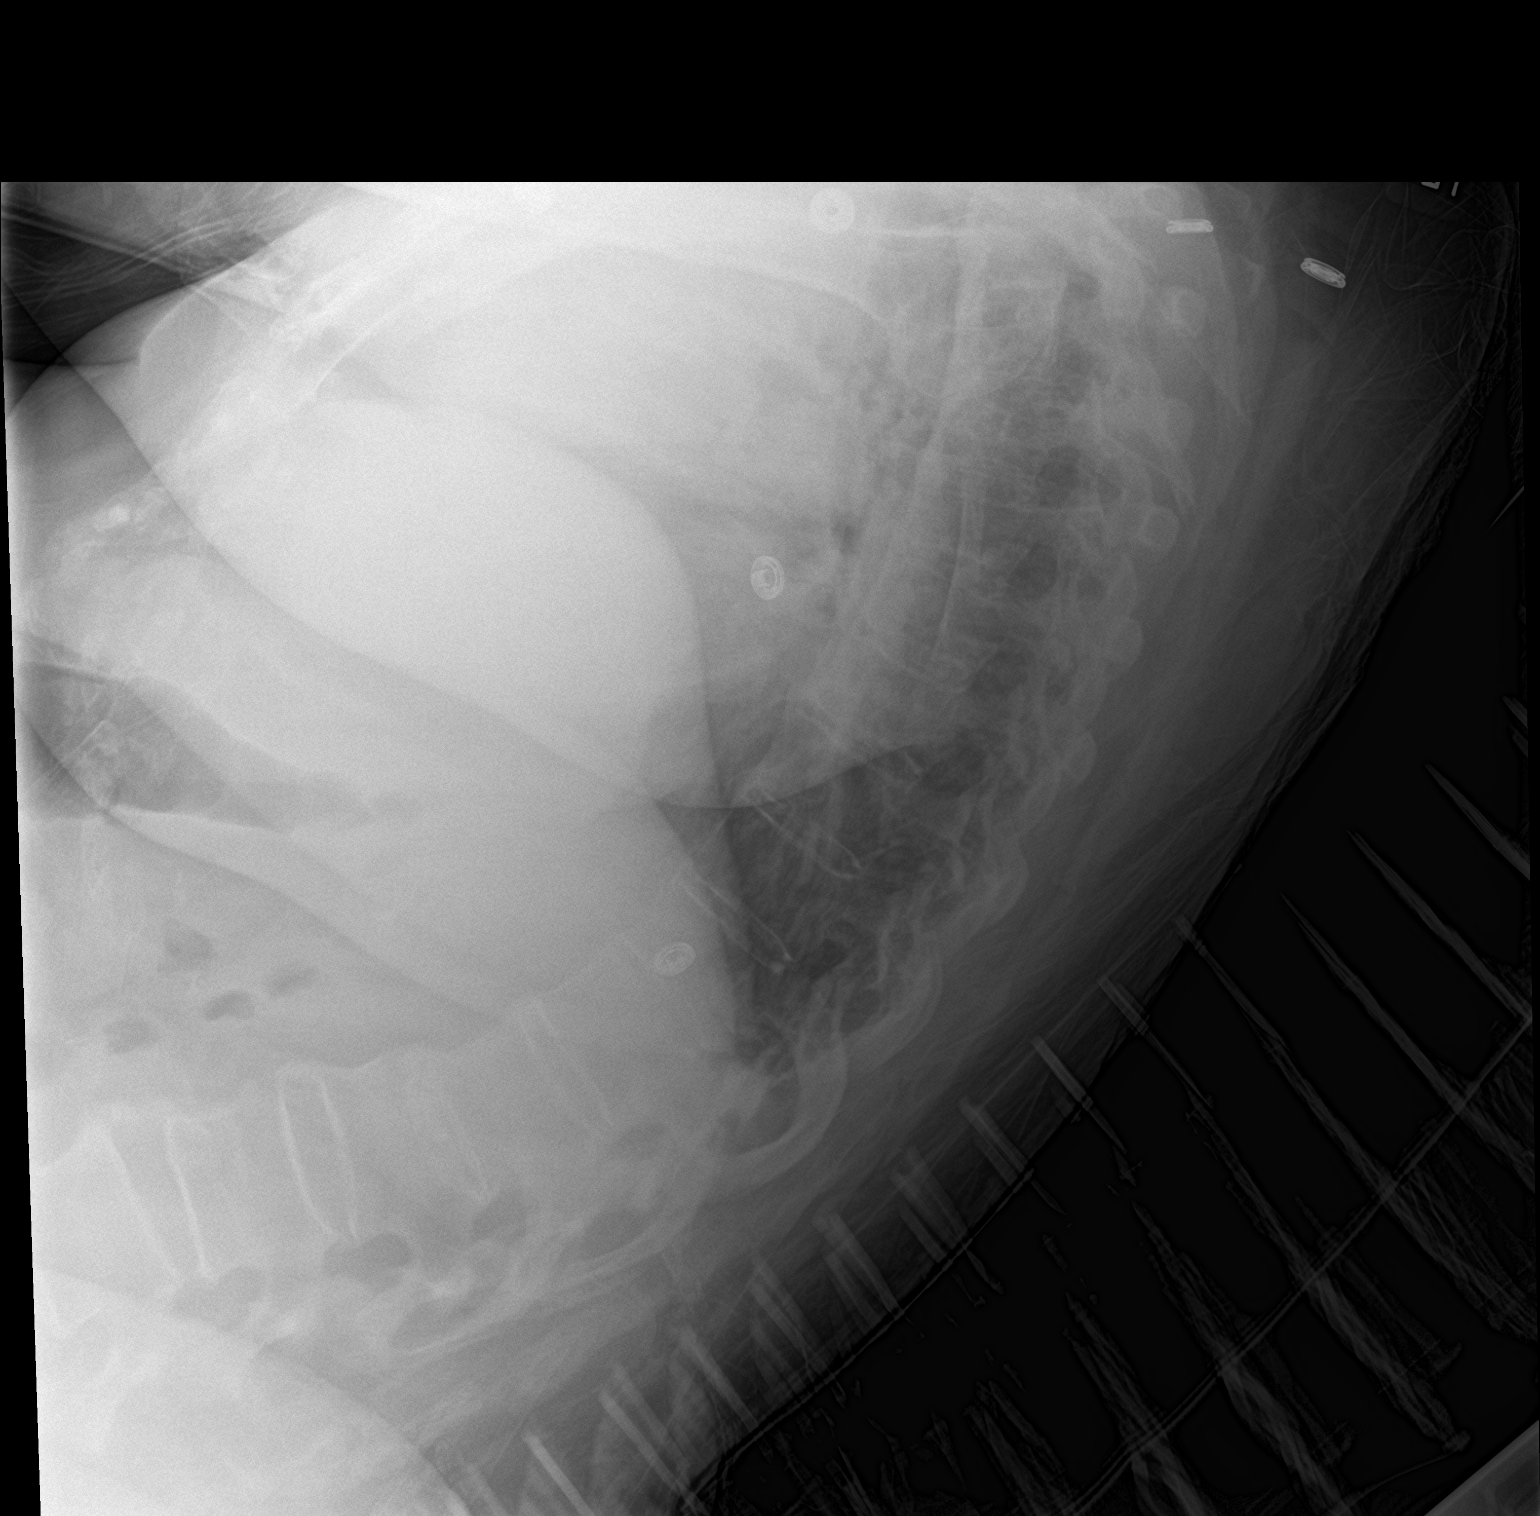

[chest ap]
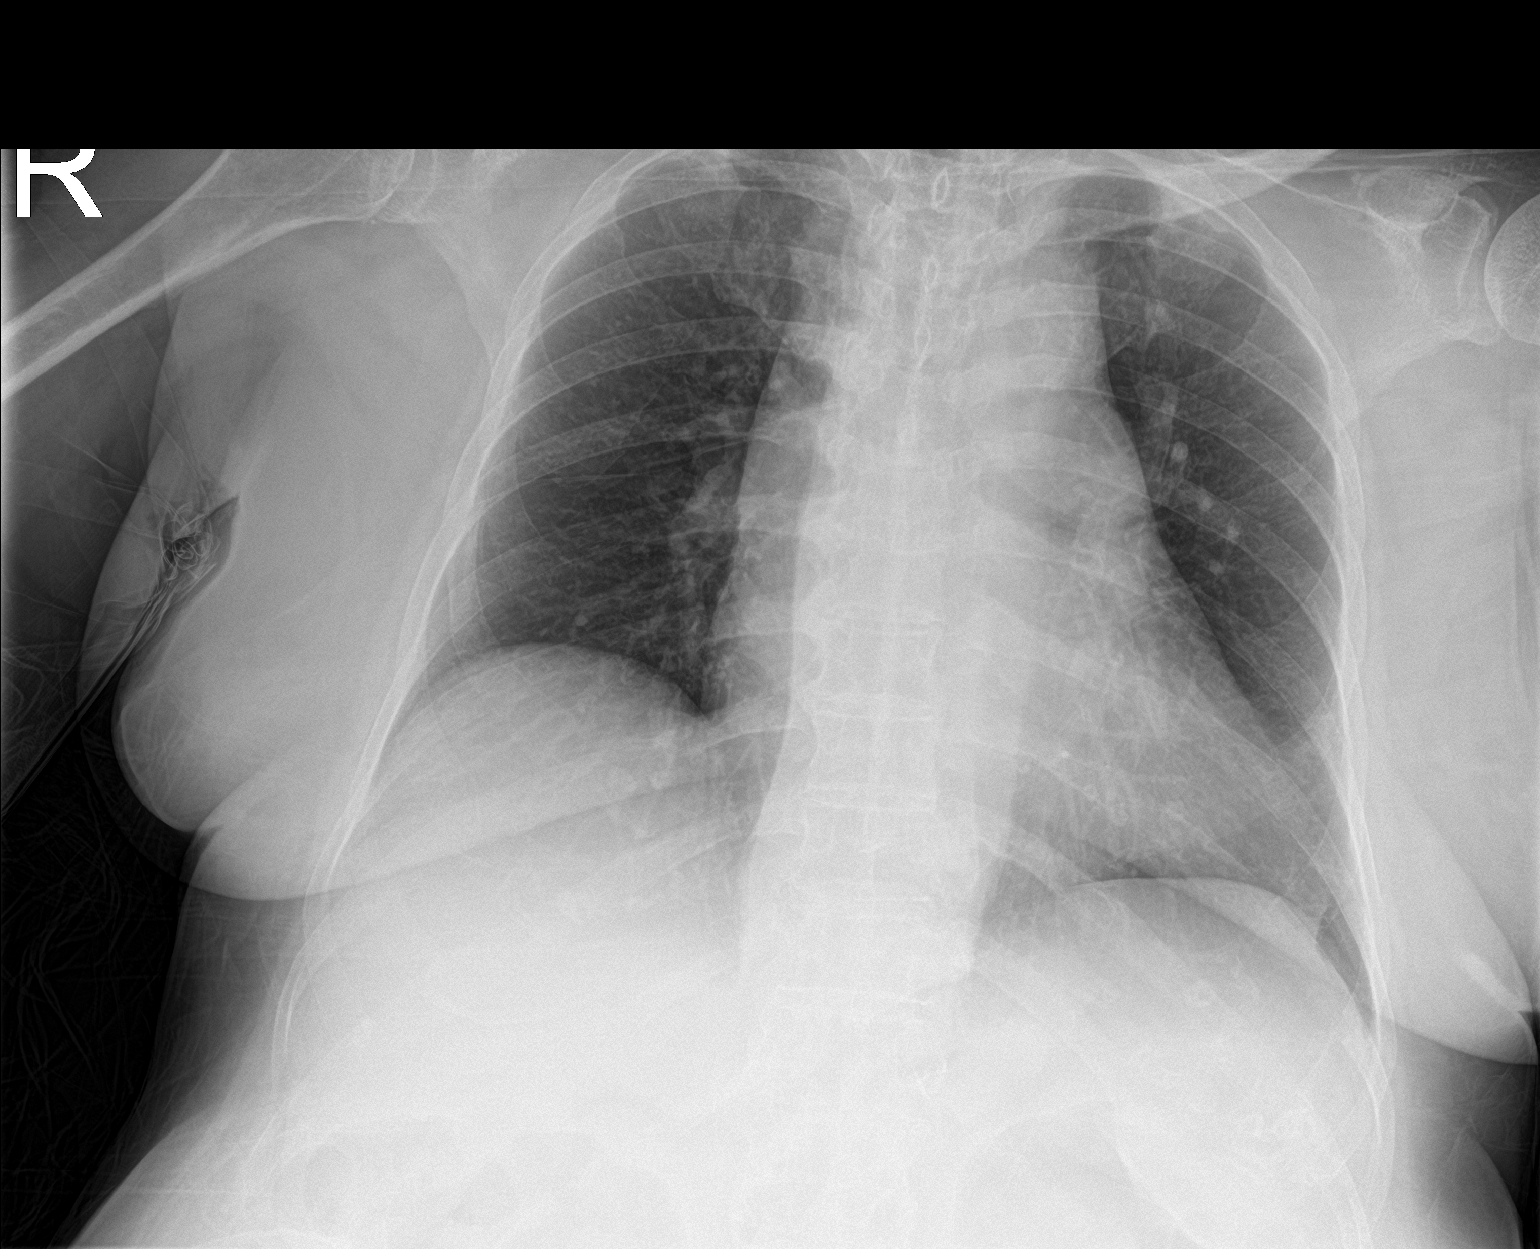

[2 of 2 positions shown; findings below may reference images not displayed]

FINDINGS: The heart size and mediastinal contours are within normal limits.
Both lungs are clear. The visualized skeletal structures are
unremarkable.
IMPRESSION: No active cardiopulmonary disease.

## 2020-02-07 IMAGING — DX DG CERVICAL SPINE COMPLETE 4+V
5 series · 5 of 5 positions shown · non-contrast
Comparison: None.

CLINICAL DATA: Neck tenderness after a fall on [DATE]

EXAM:
CERVICAL SPINE - COMPLETE 4+ VIEW

[c-spine lat]
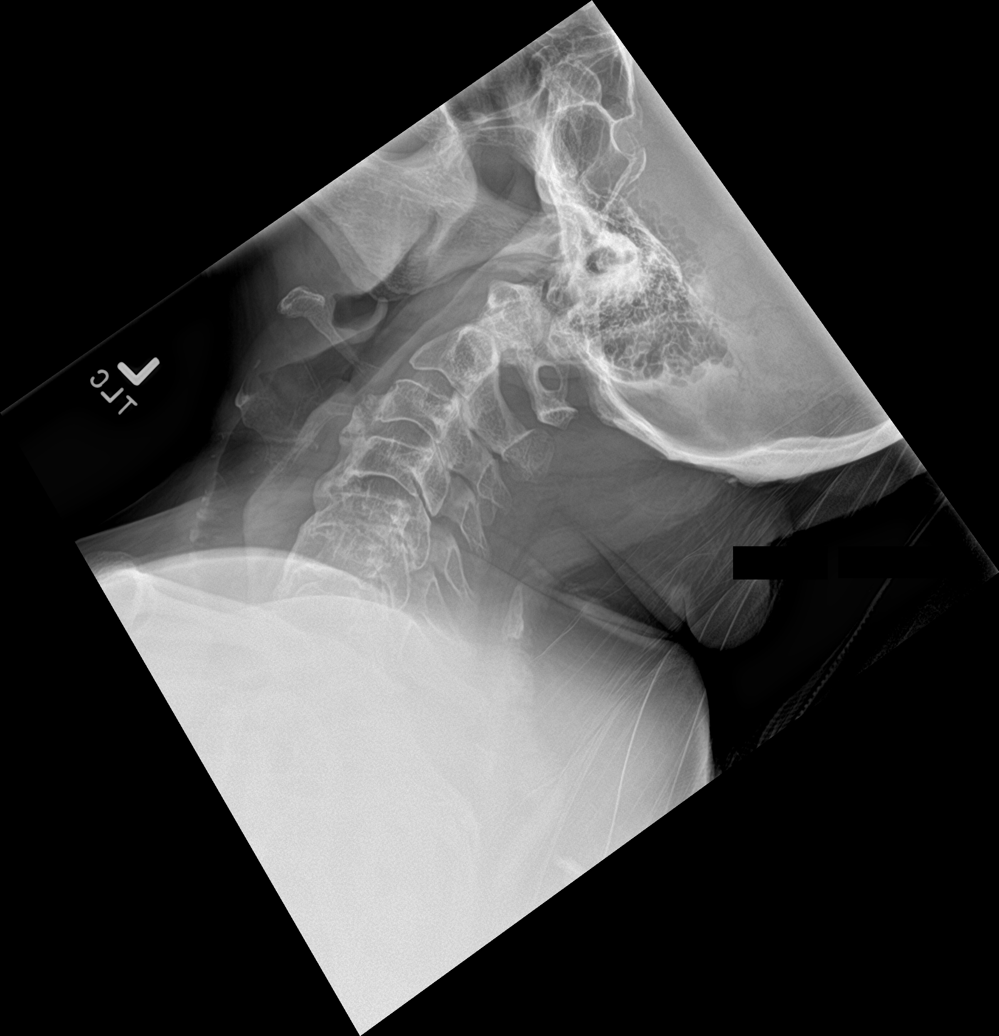

[c-spine obl (1 of 2)]
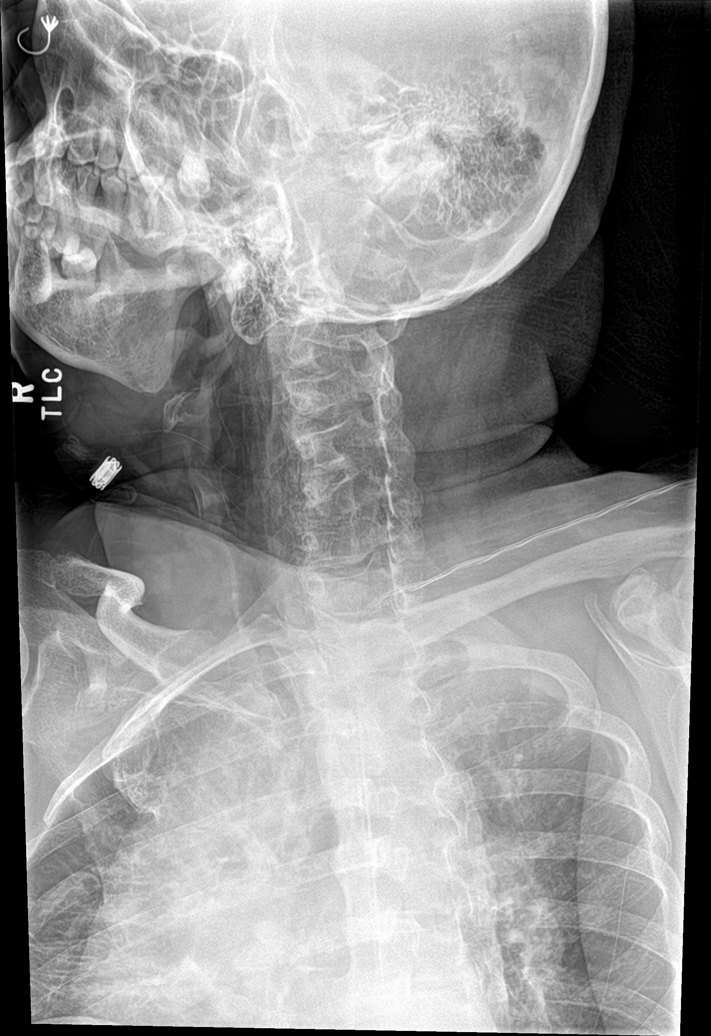

[c-spine obl (2 of 2)]
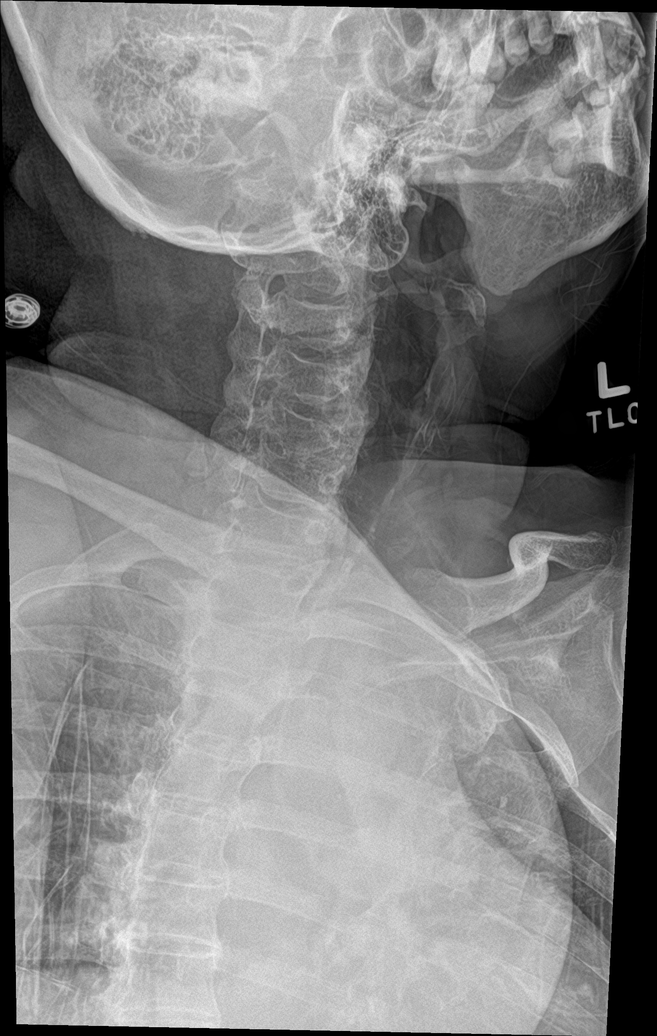

[c-spine ap]
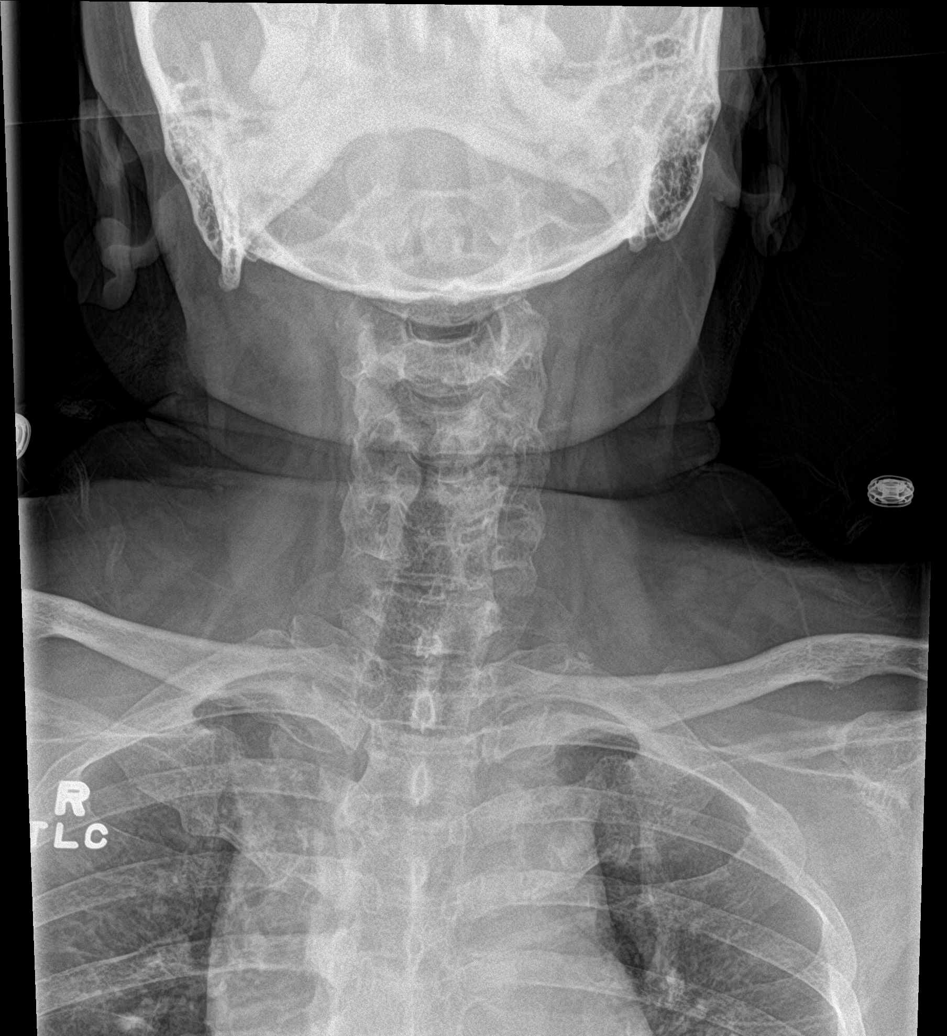

[c-spine open mouth]
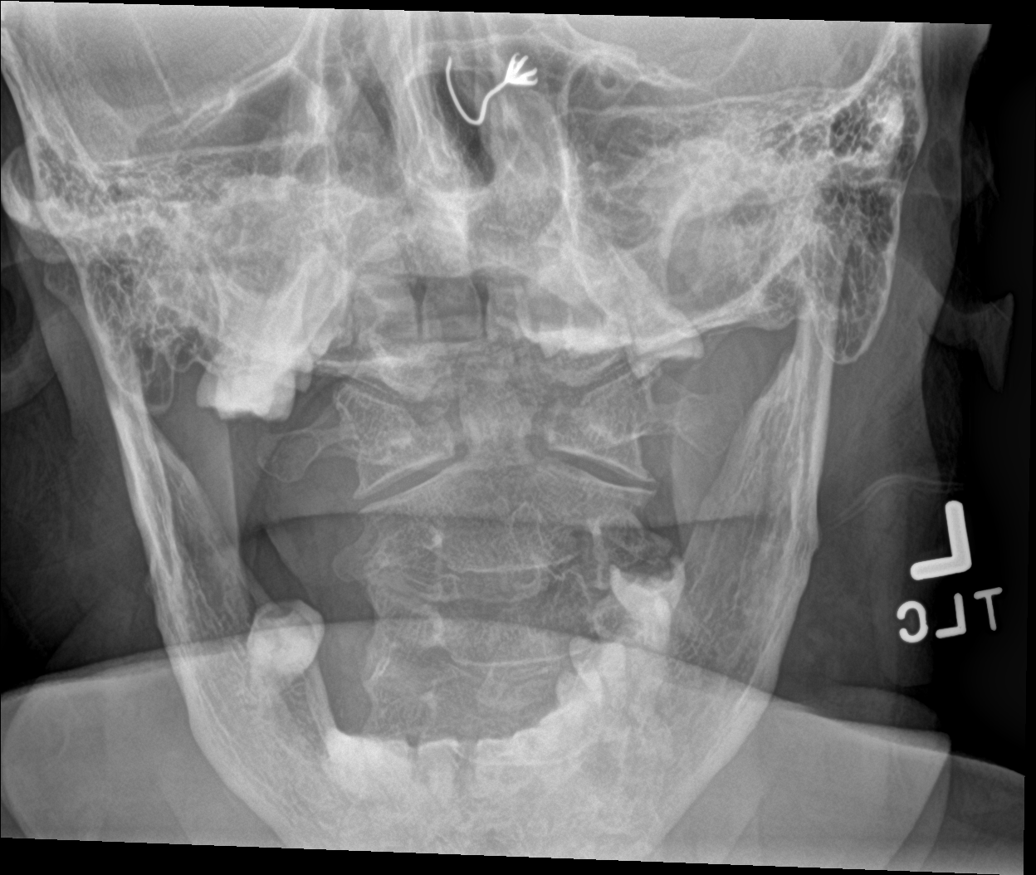

[5 of 5 positions shown; findings below may reference images not displayed]

FINDINGS: There is no fracture or prevertebral soft tissue swelling. Anterior
osteophytes fuse the cervical spine from C4-C7. Alignment is normal.
No significant facet arthritis. No appreciable foraminal stenosis.
Chronic calcification in the nuchal ligament at C5-6.
IMPRESSION: No acute abnormality. Anterior osteophytes fuse the cervical spine
from C4-C7.

## 2020-02-07 MED ORDER — LATANOPROST 0.005 % OP SOLN
1.0000 [drp] | Freq: Every day | OPHTHALMIC | Status: DC
  Administered 2020-02-08 – 2020-02-12 (×5): 1 [drp] via OPHTHALMIC
  Filled 2020-02-07: qty 2.5

## 2020-02-07 MED ORDER — INSULIN ASPART 100 UNIT/ML ~~LOC~~ SOLN
0.0000 [IU] | Freq: Three times a day (TID) | SUBCUTANEOUS | Status: DC
  Administered 2020-02-07: 3 [IU] via SUBCUTANEOUS
  Administered 2020-02-07: 2 [IU] via SUBCUTANEOUS
  Administered 2020-02-08: 3 [IU] via SUBCUTANEOUS
  Administered 2020-02-08: 5 [IU] via SUBCUTANEOUS
  Administered 2020-02-08: 3 [IU] via SUBCUTANEOUS
  Administered 2020-02-09: 2 [IU] via SUBCUTANEOUS
  Administered 2020-02-09 (×2): 5 [IU] via SUBCUTANEOUS
  Administered 2020-02-10 (×2): 2 [IU] via SUBCUTANEOUS
  Administered 2020-02-10: 5 [IU] via SUBCUTANEOUS
  Administered 2020-02-11: 3 [IU] via SUBCUTANEOUS
  Administered 2020-02-11: 2 [IU] via SUBCUTANEOUS
  Administered 2020-02-11 – 2020-02-12 (×2): 5 [IU] via SUBCUTANEOUS
  Administered 2020-02-12: 2 [IU] via SUBCUTANEOUS
  Administered 2020-02-12: 3 [IU] via SUBCUTANEOUS

## 2020-02-07 MED ORDER — POLYETHYLENE GLYCOL 3350 17 G PO PACK: 17.0000 g | PACK | Freq: Every day | ORAL | Status: DC | PRN

## 2020-02-07 MED ORDER — ACETAMINOPHEN 650 MG RE SUPP: 650.0000 mg | Freq: Four times a day (QID) | RECTAL | Status: DC | PRN

## 2020-02-07 MED ORDER — FENTANYL CITRATE (PF) 100 MCG/2ML IJ SOLN
75.0000 ug | Freq: Once | INTRAMUSCULAR | Status: AC
  Administered 2020-02-07: 75 ug via INTRAVENOUS
  Filled 2020-02-07: qty 2

## 2020-02-07 MED ORDER — DOCUSATE SODIUM 100 MG PO CAPS
100.0000 mg | ORAL_CAPSULE | Freq: Two times a day (BID) | ORAL | Status: DC
  Administered 2020-02-07 (×2): 100 mg via ORAL
  Filled 2020-02-07 (×2): qty 1

## 2020-02-07 MED ORDER — ACETAMINOPHEN 325 MG PO TABS: 650.0000 mg | ORAL_TABLET | Freq: Four times a day (QID) | ORAL | Status: DC | PRN

## 2020-02-07 MED ORDER — ONDANSETRON HCL 4 MG PO TABS: 4.0000 mg | ORAL_TABLET | Freq: Four times a day (QID) | ORAL | Status: DC | PRN

## 2020-02-07 MED ORDER — ONDANSETRON HCL 4 MG/2ML IJ SOLN
4.0000 mg | Freq: Four times a day (QID) | INTRAMUSCULAR | Status: DC | PRN
  Administered 2020-02-09: 4 mg via INTRAVENOUS
  Filled 2020-02-07: qty 2

## 2020-02-07 MED ORDER — ZOLPIDEM TARTRATE 5 MG PO TABS: 5.0000 mg | ORAL_TABLET | Freq: Every evening | ORAL | Status: DC | PRN

## 2020-02-07 MED ORDER — INSULIN ASPART 100 UNIT/ML ~~LOC~~ SOLN
0.0000 [IU] | Freq: Every day | SUBCUTANEOUS | Status: DC
  Administered 2020-02-08 – 2020-02-09 (×2): 2 [IU] via SUBCUTANEOUS
  Administered 2020-02-10: 4 [IU] via SUBCUTANEOUS
  Administered 2020-02-11: 2 [IU] via SUBCUTANEOUS

## 2020-02-07 MED ORDER — HYDRALAZINE HCL 20 MG/ML IJ SOLN: 5.0000 mg | INTRAMUSCULAR | Status: DC | PRN

## 2020-02-07 MED ORDER — HYDROCODONE-ACETAMINOPHEN 5-325 MG PO TABS
1.0000 | ORAL_TABLET | ORAL | Status: DC | PRN
  Administered 2020-02-07: 2 via ORAL
  Administered 2020-02-08: 1 via ORAL
  Administered 2020-02-09 – 2020-02-12 (×6): 2 via ORAL
  Filled 2020-02-07 (×4): qty 2
  Filled 2020-02-07: qty 1
  Filled 2020-02-07 (×3): qty 2

## 2020-02-07 MED ORDER — DILTIAZEM HCL ER COATED BEADS 180 MG PO CP24
180.0000 mg | ORAL_CAPSULE | Freq: Every day | ORAL | Status: DC
  Administered 2020-02-07 – 2020-02-12 (×6): 180 mg via ORAL
  Filled 2020-02-07 (×6): qty 1

## 2020-02-07 MED ORDER — LEVOTHYROXINE SODIUM 75 MCG PO TABS
75.0000 ug | ORAL_TABLET | Freq: Every day | ORAL | Status: DC
  Administered 2020-02-09 – 2020-02-12 (×4): 75 ug via ORAL
  Filled 2020-02-07 (×4): qty 1

## 2020-02-07 MED ORDER — BISACODYL 5 MG PO TBEC: 5.0000 mg | DELAYED_RELEASE_TABLET | Freq: Every day | ORAL | Status: DC | PRN

## 2020-02-07 MED ORDER — TRAVOPROST 0.004 % OP SOLN: 1.0000 [drp] | Freq: Every day | OPHTHALMIC | Status: DC

## 2020-02-07 MED ORDER — DORZOLAMIDE HCL-TIMOLOL MAL 2-0.5 % OP SOLN
1.0000 [drp] | Freq: Two times a day (BID) | OPHTHALMIC | Status: DC
  Administered 2020-02-08 – 2020-02-12 (×9): 1 [drp] via OPHTHALMIC
  Filled 2020-02-07: qty 10

## 2020-02-07 MED ORDER — RIVAROXABAN 15 MG PO TABS
15.0000 mg | ORAL_TABLET | Freq: Every day | ORAL | Status: DC
  Administered 2020-02-07: 15 mg via ORAL
  Filled 2020-02-07: qty 1

## 2020-02-07 MED ORDER — MORPHINE SULFATE (PF) 2 MG/ML IV SOLN: 2.0000 mg | INTRAVENOUS | Status: DC | PRN

## 2020-02-07 MED ORDER — METOPROLOL SUCCINATE ER 50 MG PO TB24
100.0000 mg | ORAL_TABLET | Freq: Every day | ORAL | Status: DC
  Administered 2020-02-07 – 2020-02-12 (×5): 100 mg via ORAL
  Filled 2020-02-07 (×4): qty 2
  Filled 2020-02-07: qty 4
  Filled 2020-02-07: qty 2

## 2020-02-07 MED ORDER — ATORVASTATIN CALCIUM 10 MG PO TABS
10.0000 mg | ORAL_TABLET | Freq: Every day | ORAL | Status: DC
  Administered 2020-02-07 – 2020-02-12 (×6): 10 mg via ORAL
  Filled 2020-02-07 (×6): qty 1

## 2020-02-07 MED ORDER — LACTATED RINGERS IV SOLN: INTRAVENOUS | Status: AC

## 2020-02-07 NOTE — Telephone Encounter (Signed)
Pt son called wanted to update you on pt status.

## 2020-02-07 NOTE — ED Notes (Signed)
Report given to rn on  [place]

## 2020-02-07 NOTE — ED Notes (Signed)
ED Provider at bedside. 

## 2020-02-07 NOTE — ED Provider Notes (Signed)
6:51 AM Handoff from [place] PA-C at shift pain.   Med history: DM on metformin, CHF on lasix, persistent afib on Xarelto/now cardizem, HTN Valsartan.  Patient presents with continued pain after recent fall. Fall occurred after taking dose of diltiazem, a new medication for her. Was seen in ED and discharged after neg imaging. Went to PCP yesterday -- sent here for hypotension (resolved during stay overnight) and continued pain. Currently awaiting x-rays, pain control.   6/14 - Saw Dr. Einar Gip -- added Cardizem 180mg  6/16 - Saw Dr. Einar Gip after fall, had 'numbness' in leg, held valsartan 2 days, restarted at 80mg  daily 6/16 - Came to ED after appointment, t/l spine imaging, CT head negative, pt ambulated and was d/c 6/22 - Saw PCP for continued mid-back pain, pain with walking, sent to ED for uncontrolled pain and hypotension at office (86/60)  8:08 AM Patient with pelvic fractures noted on imaging.  Patient is comfortable lying in bed, winces when she picks up her right leg.  Will attempt orthostatics and ambulation after pain medication.  Patient states that she lives at home with her son and uses a walker to ambulate.  Depending upon these results, will decide on discharge planning.  EKG ordered as well.   BP (!) 116/55    Pulse 63    Temp 98.3 F (36.8 C) (Oral)    Resp 16    SpO2 98%   8:58 AM Pt just received pain medication. She was barely able to sit up earlier. EKG shows rate controlled afib.   ED ECG REPORT   Date: 02/07/2020  Rate: 90  Rhythm: atrial fibrillation  QRS Axis: normal  Intervals: QT prolonged  ST/T Wave abnormalities: nonspecific T wave changes  Conduction Disutrbances:none  Narrative Interpretation:   Old EKG Reviewed: unchanged  I have personally reviewed the EKG tracing and agree with the computerized printout as noted.  9:12 AM Spoke with patient's son, [name].  Patient has been up with her walker, taking 1 step at a time.  She has been given Tylenol in  the morning and at nighttime.  Minimally effective.  Agrees with plan for admit.   BP 128/65 (BP Location: Right Arm)    Pulse 85    Temp 98.3 F (36.8 C) (Oral)    Resp 17    SpO2 98%    9:26 AM Spoke with Dr. Lorin Mercy who will see for admission. Will request orthopedics consult.      Carlisle Cater, PA-C 02/07/20 [id number]    Little, Wenda Overland, MD 02/07/20 1044

## 2020-02-07 NOTE — ED Provider Notes (Signed)
[facility] EMERGENCY DEPARTMENT Provider Note   CSN: [id number] Arrival date & time: 02/06/20  1633     History Chief Complaint  Patient presents with  . Fall  . Back Pain    Evamae Ladon Heney is a 74 y.o. female has a history of atrial fibrillation on Xarelto, acute on chronic combined systolic and diastolic heart failure, diabetes mellitus type 2, hypertension, hyperlipidemia, and hypothyroidism who presents to the emergency department with a chief complaint of hypotension.   She was seen by Dr. Einar Gip with cardiology on 6/14 and amlodipine was discontinued and she was started on diltiazem CD 100 mg daily.  She was noted to be significantly deconditioned during her cardiology appointment and had difficulty even getting up on the examination table.  She was encouraged to use a walker and consider outpatient physical therapy.  The patient was given her 1st dose of diltiazem on the morning of 6/16 and about 5 minutes after taking the medication her leg became very weak and went numb and that she fell backwards onto the floor.  EMS was called out and her systolic blood pressure was found to be 60.  In the ER, she was found to have mild A. fib with RVR that improved while she was being evaluated.  She had a negative head CT and unremarkable x-ray of the thoracic and lumbar spine.  At discharge, Dr. Einar Gip recommended holding valsartan for 3 days then starting back at half a tab of 160 mg, which the patient has been compliant with at home.  States that she has had no further episodes of weakness or numbness in her legs.  Earlier today, she was seen by her primary care provider for continued pain in her right pelvis and upper back from the fall and she was advised her to follow-up in the ER after her blood pressure was found to be 86/60 in the office.  Right pelvic pain radiates into her right hip.  It is worse with ambulation.  She is also endorsing constant, sharp, nonradiating  upper back pain.  No numbness or weakness in her extremities.  She reports that she has had difficulty walking due to the pain.  In the ER, her son reports that she has significantly declined since the fall.  Reports that she is unable to walk without assistance and has now get started to use a walker.  He reports that she has had several episodes where she has soiled herself with urine and feces because she has been unable to make it to the restroom in a timely fashion, not because of true urinary or fecal incontinence.  She had one episode of vomiting several days ago, but no additional episodes.  Also reports that she has been having increased abdominal pain.  He also reports that she has been getting very short of breath after just a few steps, which is new.  She has been compliant with her home dose of 40 mg of Lasix daily.  No fever, chills, diarrhea, nausea, abdominal pain, rash, numbness, weakness, chest pain, cough, orthopnea, or PND.  The history is provided by the patient. No language interpreter was used.       Past Medical History:  Diagnosis Date  . Cancer ([place])   . Cataract   . Glaucoma   . Hypertension   . Thyroid disease     Patient Active Problem List   Diagnosis Date Noted  . Acute on chronic combined systolic and diastolic CHF (congestive  heart failure) ([place])   . Acute respiratory failure with hypoxia ([place]) 10/18/2018  . Declining mobility 09/23/2018  . B12 deficiency 09/23/2018  . Morbid obesity with BMI of 40.0-44.9, adult ([place]) 09/23/2018  . Anemia 09/23/2018  . History of cardioversion 04/26/2018  . Type 2 diabetes mellitus without complication, without long-term current use of insulin ([place]) 03/15/2018  . Atrial fibrillation with RVR ([place]) 03/15/2018  . Morbid obesity ([place]) 01/04/2017  . Hyperlipidemia associated with type 2 diabetes mellitus ([place]) 01/04/2017  . Vitamin D deficiency 12/15/2016  . Glaucoma   . Hypertension associated with diabetes ([place])  04/19/2014  . Hypothyroidism 04/19/2014  . History of endometrial cancer 03/30/2014    Past Surgical History:  Procedure Laterality Date  . CARDIOVERSION N/A 04/08/2018   Procedure: CARDIOVERSION;  Surgeon: Adrian Prows, MD;  Location: [facility] ENDOSCOPY;  Service: Cardiovascular;  Laterality: N/A;  . CARDIOVERSION N/A 05/22/2019   Procedure: CARDIOVERSION;  Surgeon: Adrian Prows, MD;  Location: [facility];  Service: Cardiovascular;  Laterality: N/A;  . FOOT SURGERY Left   . HYSTEROSCOPY     POLYPECTOMY     OB History    Gravida  0   Para      Term      Preterm      AB      Living        SAB      TAB      Ectopic      Multiple      Live Births              Family History  Problem Relation Age of Onset  . Diabetes Son   . Healthy Mother   . Healthy Father     Social History   Tobacco Use  . Smoking status: Never Smoker  . Smokeless tobacco: Never Used  Vaping Use  . Vaping Use: Never used  Substance Use Topics  . Alcohol use: No  . Drug use: Not Currently    Home Medications Prior to Admission medications   Medication Sig Start Date End Date Taking? Authorizing Provider  atorvastatin (LIPITOR) 10 MG tablet Take 1 tablet (10 mg total) by mouth daily. 12/01/19  Yes [name], PA  cholecalciferol (VITAMIN D) 1000 UNITS tablet Take 5,000 Units by mouth See admin instructions. Dorene Grebe, Wed, friday   Yes [provider]  cyanocobalamin (,VITAMIN B-12,) 1000 MCG/ML injection 1000 mcg (1 mg) injection once per week for four weeks, followed by 1000 mcg injection once per month. Patient taking differently: Inject 1,000 mcg into the muscle every 30 (thirty) days.  09/23/18  Yes Briscoe Deutscher, DO  diltiazem (CARDIZEM CD) 180 MG 24 hr capsule Take 1 capsule (180 mg total) by mouth daily. 01/29/20 04/28/20 Yes Adrian Prows, MD  dorzolamide-timolol (COSOPT) 22.3-6.8 MG/ML ophthalmic solution Place 1 drop into both eyes 2 (two) times daily.  10/05/17  Yes  [provider]  furosemide (LASIX) 40 MG tablet Take 1 tablet (40 mg total) by mouth daily. 11/29/19  Yes [name], PA  levothyroxine (SYNTHROID) 75 MCG tablet Take 1 tablet (75 mcg total) by mouth daily before breakfast. 12/01/19   [name], PA  metFORMIN (GLUCOPHAGE) 1000 MG tablet Take 1 tablet (1,000 mg total) by mouth daily with breakfast. 12/01/19   [name], PA  metoprolol succinate (TOPROL-XL) 100 MG 24 hr tablet Take 1 tablet (100 mg total) by mouth daily. Take with or immediately following a meal. 12/01/19   [name], [place]  Rivaroxaban (XARELTO) 15 MG TABS tablet Take 1 tablet (15 mg total) by mouth daily with supper. Patient taking differently: Take 15 mg by mouth daily.  05/22/19   Adrian Prows, MD  travoprost, benzalkonium, (TRAVATAN) 0.004 % ophthalmic solution Place 1 drop into both eyes at bedtime.     [provider]  valsartan (DIOVAN) 80 MG tablet Take 80 mg by mouth daily.    [provider]    Allergies    Patient has no known allergies.  Review of Systems   Review of Systems  Constitutional: Negative for activity change, chills, diaphoresis and fever.  HENT: Negative for congestion and sore throat.   Eyes: Negative for visual disturbance.  Respiratory: Positive for shortness of breath. Negative for cough.   Cardiovascular: Negative for chest pain and palpitations.  Gastrointestinal: Negative for abdominal pain, constipation, diarrhea, nausea and vomiting.  Genitourinary: Negative for dysuria.  Musculoskeletal: Positive for back pain, myalgias and neck pain. Negative for gait problem.  Skin: Negative for rash.  Allergic/Immunologic: Negative for immunocompromised state.  Neurological: Negative for dizziness, syncope, weakness, light-headedness, numbness and headaches.  Psychiatric/Behavioral: Negative for confusion.    Physical Exam Updated Vital Signs BP (!) 111/59   Pulse (!) 40   Temp 98.3 F (36.8 C) (Oral)    Resp 18   SpO2 98%   Physical Exam Vitals and nursing note reviewed.  Constitutional:      General: She is not in acute distress.    Appearance: She is obese. She is not ill-appearing, toxic-appearing or diaphoretic.     Comments: NAD.   HENT:     Head: Normocephalic.  Eyes:     Conjunctiva/sclera: Conjunctivae normal.  Cardiovascular:     Rate and Rhythm: Normal rate and regular rhythm.     Heart sounds: No murmur heard.  No friction rub. No gallop.      Comments: DP, PT, and radial pulses are 2+ and symmetric. Pulmonary:     Effort: No respiratory distress.     Breath sounds: No rhonchi or rales.     Comments: Conversational dyspnea.  No retractions or accessory muscle use. Abdominal:     General: There is distension.     Palpations: Abdomen is soft. There is no mass.     Tenderness: There is no abdominal tenderness. There is no right CVA tenderness, left CVA tenderness, guarding or rebound.     Hernia: No hernia is present.     Comments: Abdomen is obese, distended, and hard.  No tenderness to palpation.  Musculoskeletal:     Cervical back: Neck supple.     Right lower leg: Edema present.     Left lower leg: Edema present.     Comments: Tender to palpation to the spinous processes of the lower cervical and upper thoracic spine.  No crepitus or step-offs.  Full active and passive range of motion of the cervical spine.  She also has some midline tenderness to the lumbar spinous processes without crepitus or step-offs.  There is right-sided tenderness to the posterior pelvis.  Pelvis is stable.  Full active and passive range of motion of the bilateral hips, knees, and ankles.  Increased pain with range of motion of the right hip.  No shortening of the bilateral lower extremities. No pain with internal or external rotation.  1+ pitting edema to the bilateral lower extremities   Skin:    General: Skin is warm.     Capillary Refill: Capillary refill takes less than  2 seconds.      Findings: No rash.  Neurological:     Mental Status: She is alert.     Comments: Sensation is intact and equal throughout the bilateral upper and lower extremities.  5 out of 5 strength against resistance of all large muscle groups of the bilateral lower extremities and upper extremities.  Cranial nerves II through XII are grossly intact.  Alert and oriented x4.  Psychiatric:        Behavior: Behavior normal.     ED Results / Procedures / Treatments   Labs (all labs ordered are listed, but only abnormal results are displayed) Labs Reviewed  BASIC METABOLIC PANEL - Abnormal; Notable for the following components:      Result Value   Sodium 134 (*)    Chloride 97 (*)    Glucose, Bld 200 (*)    BUN 26 (*)    Creatinine, Ser 1.41 (*)    GFR calc non Af Amer 37 (*)    GFR calc Af Amer 42 (*)    All other components within normal limits  CBC - Abnormal; Notable for the following components:   WBC 15.1 (*)    Hemoglobin 11.3 (*)    MCHC 28.7 (*)    RDW 17.2 (*)    Platelets 446 (*)    All other components within normal limits  URINALYSIS, ROUTINE W REFLEX MICROSCOPIC - Abnormal; Notable for the following components:   APPearance CLOUDY (*)    All other components within normal limits  BRAIN NATRIURETIC PEPTIDE    EKG None  Radiology No results found.  Procedures Procedures (including critical care time)  Medications Ordered in ED Medications  sodium chloride flush (NS) 0.9 % injection 3 mL (has no administration in time range)  fentaNYL (SUBLIMAZE) injection 75 mcg (has no administration in time range)    ED Course  I have reviewed the triage vital signs and the nursing notes.  Pertinent labs & imaging results that were available during my care of the patient were reviewed by me and considered in my medical decision making (see chart for details).    MDM Rules/Calculators/A&P                          74 year old female has a history of atrial fibrillation on  Xarelto, acute on chronic combined systolic and diastolic heart failure, diabetes mellitus type 2, hypertension, hyperlipidemia, and hypothyroidism presenting after she was found to be hypertensive at her PCPs office earlier in the day.  She was initially following up for uncontrolled pain secondary to a fall backwards from standing on 6/16.  X-rays of the thoracic and lumbar spine as well as CT head were unremarkable in the ER.  Notably, she was found to be hypotensive by EMS with systolic pressure in the 26O after her initial fall.  She had an episode of numbness and weakness in the legs that led to her fall, but further episodes of numbness or weakness.  She has been seen and independently evaluated by Dr. Roxanne Mins, attending physician.  In the ER, she is normotensive.  She has had multiple medication changes by cardiology over the last few days.  Initially, amlodipine was discontinued and she was started on 100 mg of diltiazem.  Then, after she was seen in the ER Dr. Einar Gip recommended holding valsartan for 3 days and then starting back at half tab of 160 mg.  Suspect intermittent hypotension is likely secondary  to medication adjustments.  Regarding her back pain, she did not have imaging of her cervical spine and there is associated tenderness on exam there is also tenderness to the right posterior pelvis.  Will obtain pelvic imaging today.  Fentanyl given for pain control to avoid hypotension.  She does also appear to have dyspnea and has 1+ pitting edema on exam.  Her abdomen is hard and distended, but nontender.  Family reports that this is baseline, but has not been noted and recent physical exams.  There is concern for volume overload.  We will add on chest x-ray and BNP.  Patient care transferred to PA Geiple at the end of my shift to reevaluate patient, follow-up on imaging, and pending BNP. Patient presentation, ED course, and plan of care discussed with review of all pertinent labs and imaging.  Please see his/her note for further details regarding further ED course and disposition.   Final Clinical Impression(s) / ED Diagnoses Final diagnoses:  None    Rx / DC Orders ED Discharge Orders    None       Jentry Mcqueary A, PA-C 77/37/36 [id number]    Delora Fuel, MD 94/70/76 [phone]

## 2020-02-07 NOTE — H&P (Signed)
History and Physical    Crystal Ashley VPX:[id number] DOB: [birth year] DOA: 02/06/2020  PCP: [name], PA Consultants:  Einar Gip - cardiology  Patient coming from:  Home - lives with son; NOK: Crystal, Ashley, [phone]   Chief Complaint: Back pain, hypotension  HPI: Crystal Ashley is a 74 y.o. female with medical history significant of hypothyroidism; HTN; afib on Xarelto; chronic combined CHF; and glaucoma presenting with back pain and hypotension.  She reports falling on 6/16 and having ongoing back and R hip pain.  She has had increased difficulty walking even with her walker.  She is feeling better at this time.  She is having normal urination and BMs.  No numbness/weakness/tingling of her legs.    ED Course:  [place] on 6/16, ?hypotension associated with new Dilt dosing.  T/L-spine imaging ok on 6/16.  Worsening ambulation and pain control.    Pelvic fractures with inability to ambulate.  Hypotension at PCP office yesterday, not present in ER.  Orthopedics to consult.  Likely WBAT.   Review of Systems: As per HPI; otherwise review of systems reviewed and negative.   Ambulatory Status:  Ambulates with a walker  COVID Vaccine Status:   Complete  Past Medical History:  Diagnosis Date  . Cancer ([place])   . Cataract   . Glaucoma   . Hypertension   . Thyroid disease     Past Surgical History:  Procedure Laterality Date  . CARDIOVERSION N/A 04/08/2018   Procedure: CARDIOVERSION;  Surgeon: Adrian Prows, MD;  Location: [facility] ENDOSCOPY;  Service: Cardiovascular;  Laterality: N/A;  . CARDIOVERSION N/A 05/22/2019   Procedure: CARDIOVERSION;  Surgeon: Adrian Prows, MD;  Location: [facility];  Service: Cardiovascular;  Laterality: N/A;  . FOOT SURGERY Left   . HYSTEROSCOPY     POLYPECTOMY    Social History   Socioeconomic History  . Marital status: Widowed    Spouse name: Not on file  . Number of children: 0  . Years of education: Not on file  . Highest education  level: Not on file  Occupational History  . Not on file  Tobacco Use  . Smoking status: Never Smoker  . Smokeless tobacco: Never Used  Vaping Use  . Vaping Use: Never used  Substance and Sexual Activity  . Alcohol use: No  . Drug use: Not Currently  . Sexual activity: Yes  Other Topics Concern  . 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 Difficulty of Paying Living Expenses:   Food Insecurity:   . Worried About Charity fundraiser in the Last Year:   . Arboriculturist in the Last Year:   Transportation Needs:   . Film/video editor (Medical):   Marland Kitchen Lack of Transportation (Non-Medical):   Physical Activity:   . Days of Exercise per Week:   . Minutes of Exercise per Session:   Stress:   . Feeling of Stress :   Social Connections:   . Frequency of Communication with Friends and Family:   . Frequency of Social Gatherings with Friends and Family:   . Attends Religious Services:   . Active Member of Clubs or Organizations:   . Attends Archivist Meetings:   Marland Kitchen Marital Status:   Intimate Partner Violence:   . Fear of Current or Ex-Partner:   . Emotionally Abused:   .  Physically Abused:   . Sexually Abused:     No Known Allergies  Family History  Problem Relation Age of Onset  . Diabetes Son   . Healthy Mother   . Healthy Father     Prior to Admission medications   Medication Sig Start Date End Date Taking? Authorizing Provider  atorvastatin (LIPITOR) 10 MG tablet Take 1 tablet (10 mg total) by mouth daily. 12/01/19  Yes [name], PA  cholecalciferol (VITAMIN D) 1000 UNITS tablet Take 5,000 Units by mouth See admin instructions. Dorene Grebe, Wed, friday   Yes [provider]  cyanocobalamin (,VITAMIN B-12,) 1000 MCG/ML injection 1000 mcg (1 mg) injection once per week for four weeks, followed by 1000 mcg injection once per  month. Patient taking differently: Inject 1,000 mcg into the muscle every 30 (thirty) days.  09/23/18  Yes Briscoe Deutscher, DO  diltiazem (CARDIZEM CD) 180 MG 24 hr capsule Take 1 capsule (180 mg total) by mouth daily. 01/29/20 04/28/20 Yes Adrian Prows, MD  dorzolamide-timolol (COSOPT) 22.3-6.8 MG/ML ophthalmic solution Place 1 drop into both eyes 2 (two) times daily.  10/05/17  Yes [provider]  furosemide (LASIX) 40 MG tablet Take 1 tablet (40 mg total) by mouth daily. 11/29/19  Yes [name], PA  levothyroxine (SYNTHROID) 75 MCG tablet Take 1 tablet (75 mcg total) by mouth daily before breakfast. 12/01/19   [name], PA  metFORMIN (GLUCOPHAGE) 1000 MG tablet Take 1 tablet (1,000 mg total) by mouth daily with breakfast. 12/01/19   [name], PA  metoprolol succinate (TOPROL-XL) 100 MG 24 hr tablet Take 1 tablet (100 mg total) by mouth daily. Take with or immediately following a meal. 12/01/19   [name], PA  Rivaroxaban (XARELTO) 15 MG TABS tablet Take 1 tablet (15 mg total) by mouth daily with supper. Patient taking differently: Take 15 mg by mouth daily.  05/22/19   Adrian Prows, MD  travoprost, benzalkonium, (TRAVATAN) 0.004 % ophthalmic solution Place 1 drop into both eyes at bedtime.     [provider]  valsartan (DIOVAN) 80 MG tablet Take 80 mg by mouth daily.    [provider]    Physical Exam: Vitals:   02/07/20 0730 02/07/20 0749 02/07/20 0800 02/07/20 0907  BP:  133/69 (!) 116/55 128/65  Pulse: 79 76 63 85  Resp:  16  17  Temp:      TempSrc:      SpO2: 99% 100% 98%      . General:  Appears calm and comfortable and is NAD, lying flat . Eyes:  PERRL, EOMI, normal lids, iris . ENT:  grossly normal hearing, lips & tongue, mmm . Neck:  no LAD, masses or thyromegaly . Cardiovascular:  RRR, no m/r/g. No LE edema.  Marland Kitchen Respiratory:   CTA bilaterally with no wheezes/rales/rhonchi.  Normal respiratory effort. . Abdomen:  soft, NT, ND,  NABS . Skin:  no rash or induration seen on limited exam . Musculoskeletal:  R pelvic discomfort on evaluation . Lower extremity:  No LE edema.  Limited foot exam with no ulcerations.  2+ distal pulses. Marland Kitchen Psychiatric:  grossly normal mood and affect, speech fluent and appropriate, AOx3 . Neurologic:  CN 2-12 grossly intact, moves all extremities in coordinated fashion - limited by pain in the RLE, sensation intact    Radiological Exams on Admission: DG Chest 2 View  Result Date: 02/07/2020 CLINICAL DATA:  Trauma secondary to a fall. Neck tenderness. Right-sided pelvic pain. EXAM: CHEST - 2  VIEW COMPARISON:  10/18/2018 FINDINGS: The heart size and mediastinal contours are within normal limits. Both lungs are clear. The visualized skeletal structures are unremarkable. IMPRESSION: No active cardiopulmonary disease. Electronically Signed   By: Lorriane Shire M.D.   On: 02/07/2020 07:40   DG Cervical Spine Complete  Result Date: 02/07/2020 CLINICAL DATA:  Neck tenderness after a fall on 01/31/2020 EXAM: CERVICAL SPINE - COMPLETE 4+ VIEW COMPARISON:  None. FINDINGS: There is no fracture or prevertebral soft tissue swelling. Anterior osteophytes fuse the cervical spine from C4-C7. Alignment is normal. No significant facet arthritis. No appreciable foraminal stenosis. Chronic calcification in the nuchal ligament at C5-6. IMPRESSION: No acute abnormality. Anterior osteophytes fuse the cervical spine from C4-C7. Electronically Signed   By: Lorriane Shire M.D.   On: 02/07/2020 07:42   DG Hip Unilat W or Wo Pelvis 2-3 Views Right  Result Date: 02/07/2020 CLINICAL DATA:  Right-sided pain after fall 1 week ago. EXAM: DG HIP (WITH OR WITHOUT PELVIS) 2-3V RIGHT COMPARISON:  None. FINDINGS: Nondisplaced right superior and inferior pubic rami fractures are present. The hip is located. No acute femoral fracture is present. IMPRESSION: Nondisplaced right superior and inferior pubic rami fractures. Electronically  Signed   By: San Morelle M.D.   On: 02/07/2020 07:42    EKG: pending   Labs on Admission: I have personally reviewed the available labs and imaging studies at the time of the admission.  Pertinent labs:   Glucose 200 BUN 26/Creatinine 1.41/GFR 37 BNP 171.1 WBC 15.1 Hgb 11.3 Platelets 446 UA WNL   Assessment/Plan Principal Problem:   Closed fracture of multiple pubic rami, right, initial encounter ([place]) Active Problems:   Hypertension associated with diabetes ([place])   Hypothyroidism   Hyperlipidemia associated with type 2 diabetes mellitus ([place])   Type 2 diabetes mellitus without complication, without long-term current use of insulin (HCC)   Morbid obesity with BMI of 40.0-44.9, adult (HCC)   Chronic combined systolic (congestive) and diastolic (congestive) heart failure (HCC)   Atrial fibrillation, chronic (HCC)   R superior/inferior pubic rami fractures -Patient with a fall on 6/16; she was seen in the ER with xrays of thoracic and lumber spines (negative) -Her cousin is a physician in [place], recommends CT of lumbar spine and thoracic  -Pubic fractures may often be undiagnosed on initial presentation -Patient d/w Hilbert Odor of orthopedics - T12,L5  look somewhat irregular on spinal films -Will order thoracic/lumbar/pelvic CT -Orthopedics will consult -Generally, management is WBAT and PT/OT consults with pain control -Will admit to Med Surg -[facility] team consult for [facility] support vs. SNF  Afib -She was started on Diltiazem for rate control on 6/14 and has had subsequent hypotension -Will continue Dilt and BB for rate control -Continue Xarelto  Stage 3a/b CKD -Renal US - inpatient nephrology consult -Hold ARB -While there may be a component of AKI, it appears to be more associated with CKD and she appears to be at/near baseline  HTN at home, hypotension  -Continue Dilt/BB -Hold ARB -Nausea, possibly associated with hypotension - if improving, no further  evaluation.  Cousin prefers CT A/P but is okay with holding for now.  Chronic combined CHF -10/19/18 echo with preserved EF and indeterminate diastolic function -Appears compensated at this time -Given hypotension and reported decreased PO intake, will hold Lasix and give gentle IVF for total of 1 L   HLD -Continue Lipitor  DM -A1c was 6.8 on 4/14, indicating reasonable control -hold Glucophage -Cover with moderate-scale SSI  Morbid obesity -BMI 36.1 -Weight loss should be encouraged -Outpatient PCP/bariatric medicine encouraged  Hypothyroidism -Recent normal TSH -Continue Synthroid at current dose for now  Glaucoma -Continue Cosopt, [place]   Note: This patient has been tested and is negative for the novel coronavirus COVID-19.  DVT prophylaxis: Xarelto Code Status:  DNR - confirmed with patient Family Communication: None present; I spoke with the patient's son and cousin (MD in [place]) by telephone at the time of admission. Disposition Plan:  The patient is from: home  Anticipated d/c is to: home with [facility] services vs. SNF Anticipated d/c date will depend on clinical response to treatment, but possibly as early as tomorrow if she has excellent response to treatment  Patient is currently: acutely ill Consults called: Orthopedics; Nephrology; PT/OT/TOC team Admission status:  Admit - It is my clinical opinion that admission to INPATIENT is reasonable and necessary because of the expectation that this patient will require hospital care that crosses at least 2 midnights to treat this condition based on the medical complexity of the problems presented.  Given the aforementioned information, the predictability of an adverse outcome is felt to be significant.    Karmen Bongo MD [facility]   How to contact the [facility] Attending or Consulting provider [place] or covering provider during after hours [place], for this patient?  1. Check the care team in [facility] and look for a)  attending/consulting TR[facility] provider listed and b) the [facility] team listed 2. Log into [email or web address] and use [place] universal password to access. If you do not have the password, please contact the hospital operator. 3. Locate the [facility] provider you are looking for under [facility] and page to a number that you can be directly reached. 4. If you still have difficulty reaching the provider, please page the [facility] (Director on Call) for the Hospitalists listed on amion for assistance.   02/07/2020, 10:41 AM

## 2020-02-07 NOTE — ED Notes (Signed)
Patient unable to complete orthostatic vitals. She states she is in too much pain to sit completely up, and she is unable to stand. Per patient the pain is in her back.

## 2020-02-07 NOTE — TOC Initial Note (Signed)
Transition of Care (TOC) - Initial/Assessment Note  From home with family.  Uses rolling walker at home.  Patient Details  Name: Crystal Ashley MRN: [id number] Date of Birth: 1945/11/08  Transition of Care [facility]) CM/SW Contact:    Fuller Mandril, RN Phone Number: 02/07/2020, 11:01 AM  Clinical Narrative:                   Expected Discharge Plan: Skilled Nursing Facility Barriers to Discharge: Continued Medical Work up   Patient Goals and CMS Choice Patient states their goals for this hospitalization and ongoing recovery are:: low blood pressure and pain      Expected Discharge Plan and Services Expected Discharge Plan: [place] In-house Referral: Clinical Social Work Discharge Planning Services: CM Consult   Living arrangements for the past 2 months: Single Family Home                 DME Arranged: Walker rolling                    Prior Living Arrangements/Services Living arrangements for the past 2 months: Single Family Home Lives with:: Adult Children Patient language and need for interpreter reviewed:: Yes Do you feel safe going back to the place where you live?: Yes      Need for Family Participation in Patient Care: Yes (Comment) Care giver support system in place?: Yes (comment) Current home services: DME Criminal Activity/Legal Involvement Pertinent to Current Situation/Hospitalization: No - Comment as needed  Activities of Daily Living      Permission Sought/Granted Permission sought to share information with : Case Manager, Family Supports Permission granted to share information with : Yes, Verbal Permission Granted              Emotional Assessment Appearance:: Appears stated age Attitude/Demeanor/Rapport: Guarded Affect (typically observed): Accepting, Calm Orientation: : Oriented to Self, Oriented to Place, Oriented to  Time, Oriented to Situation Alcohol / Substance Use: Never Used Psych Involvement: No  (comment)  Admission diagnosis:  Closed fracture of multiple pubic rami, right, initial encounter [facility]) [S32.591A] Patient Active Problem List   Diagnosis Date Noted  . Closed fracture of multiple pubic rami, right, initial encounter ([place]) 02/07/2020  . Atrial fibrillation, chronic ([place]) 02/07/2020  . Chronic combined systolic (congestive) and diastolic (congestive) heart failure ([place])   . Acute respiratory failure with hypoxia ([place]) 10/18/2018  . Declining mobility 09/23/2018  . B12 deficiency 09/23/2018  . Morbid obesity with BMI of 40.0-44.9, adult ([place]) 09/23/2018  . Anemia 09/23/2018  . History of cardioversion 04/26/2018  . Type 2 diabetes mellitus without complication, without long-term current use of insulin ([place]) 03/15/2018  . Atrial fibrillation with RVR ([place]) 03/15/2018  . Morbid obesity ([place]) 01/04/2017  . Hyperlipidemia associated with type 2 diabetes mellitus ([place]) 01/04/2017  . Vitamin D deficiency 12/15/2016  . Glaucoma   . Hypertension associated with diabetes ([place]) 04/19/2014  . Hypothyroidism 04/19/2014  . History of endometrial cancer 03/30/2014   PCP:  [name], PA Pharmacy:   [facility] [place] [address] Phone: [phone] Fax: [phone]  [facility] [place], [place] [place] [address] Phone: [phone] Fax: [phone]  [place], [place] [address] [place] [address] Phone: [phone] Fax: [phone]     Social Determinants of  Health (SDOH) Interventions    Readmission Risk Interventions No flowsheet data found.

## 2020-02-07 NOTE — Telephone Encounter (Signed)
Spoke to pt's son told him was returning call is there anything specific that you wanted update [place] with or questions? Told him we are able to follow along her hospitalization chart and saw that she was admitted. Mr. Trent said yes they are admitting her, she is still in the ED they are getting a bed ready. Also he said pt will probably go to rehab when discharged. Told him okay, tell her hello and if there is anything you need please let us know. Mr. Dacey verbalized understanding.

## 2020-02-07 NOTE — Telephone Encounter (Signed)
Please call patient and see if he has any specific update or questions/concerns. We are following along her hospitalization and can see that she is admitted.

## 2020-02-07 NOTE — ED Notes (Signed)
[facility] applied to pt. Pt also set up for lunch. Pt reports that they are vegitarian and requested an alternative tray. Chart modified to reflect this and new tray ordered.

## 2020-02-07 NOTE — Consult Note (Signed)
Reason for Consult:Pubic rami fxs Referring Physician: Tito Dine Shanti Ashley is an 74 y.o. female.  HPI: Crystal Ashley fell on 6/16 when her left leg went numb. This was the first time it had happened and it hasn't happened since. She came to the ED for evaluation. She was c/o back pain and x-rays were negative. She was not c/o pelvic pain and films were not done. She was sent home but continued to have back pain, especially when she moved or walked, and came back in for evaluation. At this point she also c/o right pelvic pain though denied that to me. X-rays showed right sup/inf pubic rami fxs and orthopedic surgery was consulted.  Past Medical History:  Diagnosis Date  . Cancer ([place])   . Cataract   . Glaucoma   . Hypertension   . Thyroid disease     Past Surgical History:  Procedure Laterality Date  . CARDIOVERSION N/A 04/08/2018   Procedure: CARDIOVERSION;  Surgeon: Adrian Prows, MD;  Location: [facility] ENDOSCOPY;  Service: Cardiovascular;  Laterality: N/A;  . CARDIOVERSION N/A 05/22/2019   Procedure: CARDIOVERSION;  Surgeon: Adrian Prows, MD;  Location: [place] [facility];  Service: Cardiovascular;  Laterality: N/A;  . FOOT SURGERY Left   . HYSTEROSCOPY     POLYPECTOMY    Family History  Problem Relation Age of Onset  . Diabetes Son   . Healthy Mother   . Healthy Father     Social History:  reports that she has never smoked. She has never used smokeless tobacco. She reports previous drug use. She reports that she does not drink alcohol.  Allergies: No Known Allergies  Medications: I have reviewed the patient's current medications.  Results for orders placed or performed during the hospital encounter of 02/06/20 (from the past 48 hour(s))  Basic metabolic panel     Status: Abnormal   Collection Time: 02/06/20  6:19 PM  Result Value Ref Range   Sodium 134 (L) 135 - 145 mmol/L   Potassium 4.6 3.5 - 5.1 mmol/L   Chloride 97 (L) 98 - 111 mmol/L   CO2 24 22 - 32 mmol/L   Glucose, Bld 200 (H)  70 - 99 mg/dL    Comment: Glucose reference range applies only to samples taken after fasting for at least 8 hours.   BUN 26 (H) 8 - 23 mg/dL   Creatinine, Ser 1.41 (H) 0.44 - 1.00 mg/dL   Calcium 9.2 8.9 - 10.3 mg/dL   GFR calc non Af Amer 37 (L) >60 mL/min   GFR calc Af Amer 42 (L) >60 mL/min   Anion gap 13 5 - 15    Comment: Performed at [facility] [address], [place], [place] [address]  CBC     Status: Abnormal   Collection Time: 02/06/20  6:19 PM  Result Value Ref Range   WBC 15.1 (H) 4.0 - 10.5 K/uL   RBC 4.28 3.87 - 5.11 MIL/uL   Hemoglobin 11.3 (L) 12.0 - 15.0 g/dL   HCT 39.4 36 - 46 %   MCV 92.1 80.0 - 100.0 fL   MCH 26.4 26.0 - 34.0 pg   MCHC 28.7 (L) 30.0 - 36.0 g/dL   RDW 17.2 (H) 11.5 - 15.5 %   Platelets 446 (H) 150 - 400 K/uL   nRBC 0.0 0.0 - 0.2 %    Comment: Performed at [facility] [address] [place], [place] [address]  Urinalysis, Routine w reflex microscopic  Status: Abnormal   Collection Time: 02/07/20  1:04 AM  Result Value Ref Range   Color, Urine YELLOW YELLOW   APPearance CLOUDY (A) CLEAR   Specific Gravity, Urine 1.019 1.005 - 1.030   pH 5.0 5.0 - 8.0   Glucose, UA NEGATIVE NEGATIVE mg/dL   Hgb urine dipstick NEGATIVE NEGATIVE   Bilirubin Urine NEGATIVE NEGATIVE   Ketones, ur NEGATIVE NEGATIVE mg/dL   Protein, ur NEGATIVE NEGATIVE mg/dL   Nitrite NEGATIVE NEGATIVE   Leukocytes,Ua NEGATIVE NEGATIVE    Comment: Performed at [facility] [address], [place], [place] [address]  Brain natriuretic peptide     Status: Abnormal   Collection Time: 02/07/20  6:20 AM  Result Value Ref Range   B Natriuretic Peptide 171.1 (H) 0.0 - 100.0 pg/mL    Comment: Performed at [facility] [address] [place], [place] [address]    DG Chest 2 View  Result Date: 02/07/2020 CLINICAL DATA:  Trauma secondary to a fall. Neck tenderness. Right-sided pelvic pain. EXAM: CHEST - 2 VIEW COMPARISON:  10/18/2018 FINDINGS: The  heart size and mediastinal contours are within normal limits. Both lungs are clear. The visualized skeletal structures are unremarkable. IMPRESSION: No active cardiopulmonary disease. Electronically Signed   By: Lorriane Shire M.D.   On: 02/07/2020 07:40   DG Cervical Spine Complete  Result Date: 02/07/2020 CLINICAL DATA:  Neck tenderness after a fall on 01/31/2020 EXAM: CERVICAL SPINE - COMPLETE 4+ VIEW COMPARISON:  None. FINDINGS: There is no fracture or prevertebral soft tissue swelling. Anterior osteophytes fuse the cervical spine from C4-C7. Alignment is normal. No significant facet arthritis. No appreciable foraminal stenosis. Chronic calcification in the nuchal ligament at C5-6. IMPRESSION: No acute abnormality. Anterior osteophytes fuse the cervical spine from C4-C7. Electronically Signed   By: Lorriane Shire M.D.   On: 02/07/2020 07:42   DG Hip Unilat W or Wo Pelvis 2-3 Views Right  Result Date: 02/07/2020 CLINICAL DATA:  Right-sided pain after fall 1 week ago. EXAM: DG HIP (WITH OR WITHOUT PELVIS) 2-3V RIGHT COMPARISON:  None. FINDINGS: Nondisplaced right superior and inferior pubic rami fractures are present. The hip is located. No acute femoral fracture is present. IMPRESSION: Nondisplaced right superior and inferior pubic rami fractures. Electronically Signed   By: San Morelle M.D.   On: 02/07/2020 07:42    Review of Systems  HENT: Negative for ear discharge, ear pain, hearing loss and tinnitus.   Eyes: Negative for photophobia and pain.  Respiratory: Negative for cough and shortness of breath.   Cardiovascular: Negative for chest pain.  Gastrointestinal: Negative for abdominal pain, nausea and vomiting.  Genitourinary: Negative for dysuria, flank pain, frequency and urgency.  Musculoskeletal: Positive for back pain. Negative for myalgias and neck pain.  Neurological: Negative for dizziness and headaches.  Hematological: Does not bruise/bleed easily.   Psychiatric/Behavioral: The patient is not nervous/anxious.    Blood pressure 128/65, pulse 85, temperature 98.3 F (36.8 C), temperature source Oral, resp. rate 17, SpO2 98 %. Physical Exam  Constitutional: She appears well-developed. No distress.  HENT:  Head: Normocephalic and atraumatic.  Eyes: Conjunctivae are normal. Right eye exhibits no discharge. Left eye exhibits no discharge. No scleral icterus.  Cardiovascular: Normal rate and regular rhythm.  Respiratory: Effort normal. No respiratory distress.  Musculoskeletal:     Cervical back: Normal range of motion.     Comments: Pelvis--no traumatic wounds or rash, no ecchymosis, stable to manual stress, nontender  BLE No traumatic  wounds, ecchymosis, or rash  Nontender  No knee or ankle effusion  Knee stable to varus/ valgus and anterior/posterior stress  Sens DPN, SPN, TN intact  Motor EHL, ext, flex, evers 5/5  DP 1+, PT 0, No significant edema  Neurological: She is alert.  Skin: Skin is warm and dry. She is not diaphoretic.  Psychiatric: Her behavior is normal.    Assessment/Plan: Right sup/inf pubic rami fxs -- She may be WBAT for these injuries. I explained they would continue to be painful for another 3-4 weeks. She is going to undergo CT of the pelvic to ensure there is no sacral component as well. Back pain -- She is getting CT of t and l spine, will need NS evaluation if abnormal Multiple medical problems including atrial fibrillation on Xarelto, acute on chronic combined systolic and diastolic heart failure, diabetes mellitus type 2, hypertension, hyperlipidemia, and hypothyroidism -- per primary service    Lisette Abu, PA-C Orthopedic Surgery [phone] 02/07/2020, 11:12 AM

## 2020-02-07 NOTE — Progress Notes (Signed)
PT Cancellation Note  Patient Details Name: Crystal Ashley MRN: [id number] DOB: 1945/09/14   Cancelled Treatment:    Reason Eval/Treat Not Completed: Patient at procedure or test/unavailable Pt currently out of room and awaiting lumbar spinal films. Will follow up as pt cleared for mobility and as schedule allows.   Lou Miner, DPT  Acute Rehabilitation Services  Pager: [phone] Office: [phone]    Rudean Hitt 02/07/2020, 2:43 PM

## 2020-02-07 NOTE — Progress Notes (Signed)
[facility] Brief Note:  Pt is a 57F with a PMH sig for hypothyroidism, HTN, afib on Xarelto, chronic combined CHF, and glaucoma who presented with back pain and hypotension after a fall.  Pt had L leg buckle on 6/16 and came to ED, had CT head which was negative, back xray neg.  Returned yesterday 6/22 after having increased pain, inability to walk, pelvic xray showing nondisplaced superior and inferior pubic rami fractures.     Hypotension thought to be d/t new dilt dosing and poor PO intake from pain.    C/s requested for chronic CKD 3a/b.  Cr trend as below: Cr 0.67 2018 Cr 0.9 09/20/2018 Cr 0.9 10/2018 Cr 1.26 01/17/2019 Cr 1.36 05/18/2019 Cr 1.10 01/31/2020 Cr 1.41 02/06/2020  Appears to be at/ near baseline.    UA both 6/16 and 6/23 bland  Plan: - agree with gentle IVFs as you are doing - renal US completed, doesn't look like any obstruction on my read, formal pending - agree with holding ARB and Lasix for now - could consider orthostatics as well - nothing further to add other than the excellent management by the hospitalist team - can be referred as OP for evaluation in our clinic if desired - if there are any questions or if a formal consult is desired please let me know  Madelon Lips MD [place] pgr [phone]

## 2020-02-07 NOTE — Progress Notes (Addendum)
Chart reviewed Plan to see patient after this next operative case Patient does have "chalk stick" compression fractures in the setting of thoracic kyphosis and lumbar ankylosis Recommend neurosurgical consultation Also recommend holding Xarelto until you have discussed with neurosurgery the plan for this patient. She may need MRI scanning. Will attempt to reach triad floor coverage

## 2020-02-07 NOTE — ED Notes (Signed)
Two attempts on orthostatics, once before pain meds, once after.  Both times, pt was unable to tolerate even having the head of the bed elevated.

## 2020-02-07 NOTE — ED Notes (Signed)
Patient transported to X-ray 

## 2020-02-07 NOTE — ED Provider Notes (Deleted)
6:51 AM Handoff from [place] PA-C at shift pain.   Patient presents with continued pain after recent fall. Fall occurred after taking dose of diltiazem, a new medication for her. Was seen in ED and discharged after neg imaging. Went to PCP yesterday -- sent here for hypotension (resolved during stay overnight) and continued pain. Currently awaiting x-rays, pain control.   6/14 - Saw Dr. Einar Gip -- added Cardizem 180mg  6/16 - Saw Dr. Einar Gip after fall, had 'numbness' in leg, held valsartan 2 days, restarted at 80mg  daily 6/16 - Came to ED after appointment, t/l spine imaging, CT head negative, pt ambulated and was d/c 6/22 - Saw PCP for continued mid-back pain, pain with walking, sent to ED for uncontrolled pain and hypotension at office (86/60)  8:08 AM Patient with pelvic fractures noted on imaging.  Patient is comfortable lying in bed, winces when she picks up her right leg.  Will attempt orthostatics and ambulation after pain medication.  Patient states that she lives at home with her son and uses a walker to ambulate.  Depending upon these results, will decide on discharge planning.  EKG ordered as well.   BP (!) 116/55   Pulse 63   Temp 98.3 F (36.8 C) (Oral)   Resp 16   SpO2 98%     Carlisle Cater, PA-C 02/07/20 1329

## 2020-02-08 ENCOUNTER — Ambulatory Visit: Payer: Medicare Other

## 2020-02-08 ENCOUNTER — Inpatient Hospital Stay (HOSPITAL_COMMUNITY): Payer: Medicare Other

## 2020-02-08 LAB — GLUCOSE, CAPILLARY
Glucose-Capillary: 157 mg/dL — ABNORMAL HIGH (ref 70–99)
Glucose-Capillary: 155 mg/dL — ABNORMAL HIGH (ref 70–99)
Glucose-Capillary: 225 mg/dL — ABNORMAL HIGH (ref 70–99)
Glucose-Capillary: 213 mg/dL — ABNORMAL HIGH (ref 70–99)

## 2020-02-08 LAB — CBC
HCT: 31.2 % — ABNORMAL LOW (ref 36.0–46.0)
Hemoglobin: 9.1 g/dL — ABNORMAL LOW (ref 12.0–15.0)
MCH: 26.8 pg (ref 26.0–34.0)
MCHC: 29.2 g/dL — ABNORMAL LOW (ref 30.0–36.0)
MCV: 92 fL (ref 80.0–100.0)
Platelets: 278 10*3/uL (ref 150–400)
RBC: 3.39 MIL/uL — ABNORMAL LOW (ref 3.87–5.11)
RDW: 17.2 % — ABNORMAL HIGH (ref 11.5–15.5)
WBC: 10.2 10*3/uL (ref 4.0–10.5)
nRBC: 0 % (ref 0.0–0.2)

## 2020-02-08 LAB — BASIC METABOLIC PANEL
Anion gap: 7 (ref 5–15)
BUN: 18 mg/dL (ref 8–23)
CO2: 28 mmol/L (ref 22–32)
Calcium: 8.8 mg/dL — ABNORMAL LOW (ref 8.9–10.3)
Chloride: 103 mmol/L (ref 98–111)
Creatinine, Ser: 0.98 mg/dL (ref 0.44–1.00)
GFR calc Af Amer: >60 mL/min (ref >60)
GFR calc non Af Amer: 57 mL/min — ABNORMAL LOW (ref >60)
Glucose, Bld: 152 mg/dL — ABNORMAL HIGH (ref 70–99)
Potassium: 4.6 mmol/L (ref 3.5–5.1)
Sodium: 138 mmol/L (ref 135–145)

## 2020-02-08 IMAGING — MR MR THORACIC SPINE W/O CM
4 of 11 series · 17 of 48 positions shown · non-contrast
Comparison: CT of the thoracolumbar spine [DATE]

CLINICAL DATA: Fracture, hip.  History of fall.

EXAM:
MRI THORACIC AND LUMBAR SPINE WITHOUT CONTRAST
TECHNIQUE: Multiplanar and multiecho pulse sequences of the thoracic and lumbar
spine were obtained without intravenous contrast.

[Series 11: T2 · sagittal · 4.0mm · 0.55mm/px · 3 of 16 slices shown (1 of 4)]
[im 1/16]
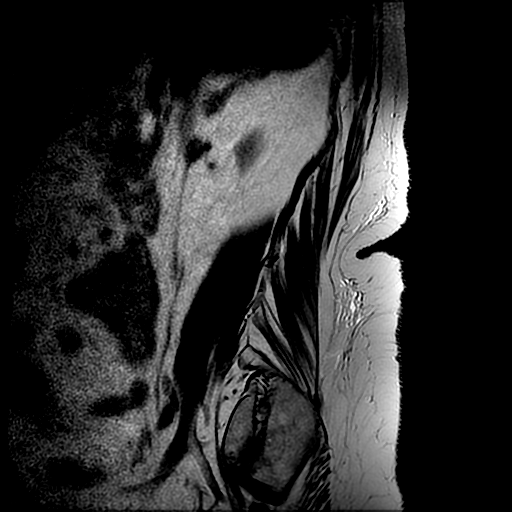
[im 8/16]
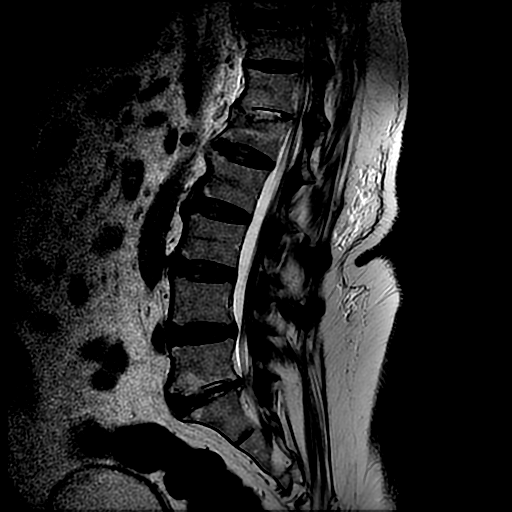
[im 16/16]
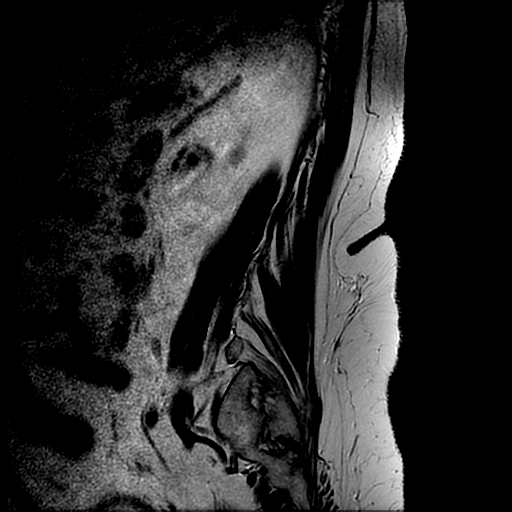

[Series 14: T2 · axial · 4.0mm · 0.39mm/px · z∈[-365,-180]mm · 6 of 33 slices shown (2 of 4)]
[im 1/33]
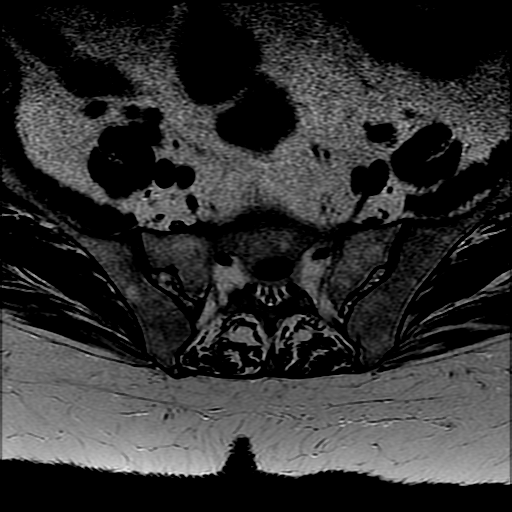
[im 7/33]
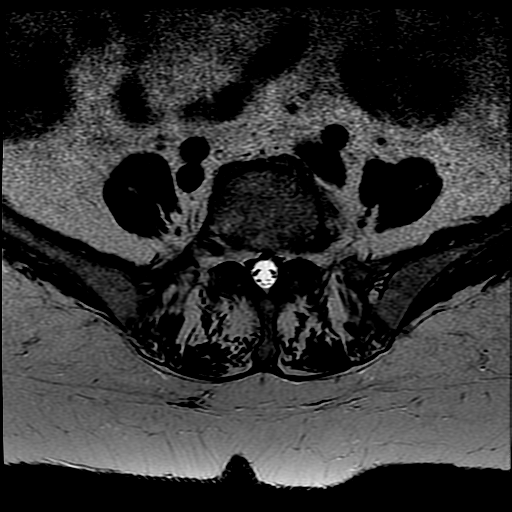
[im 13/33]
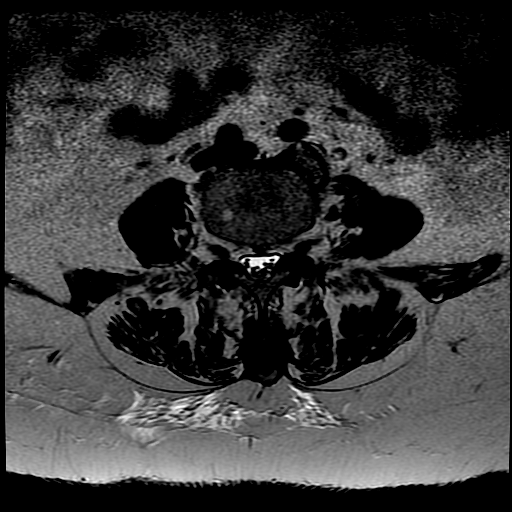
[im 20/33]
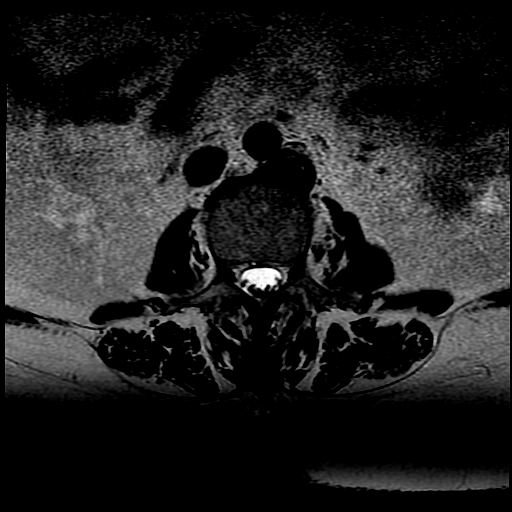
[im 26/33]
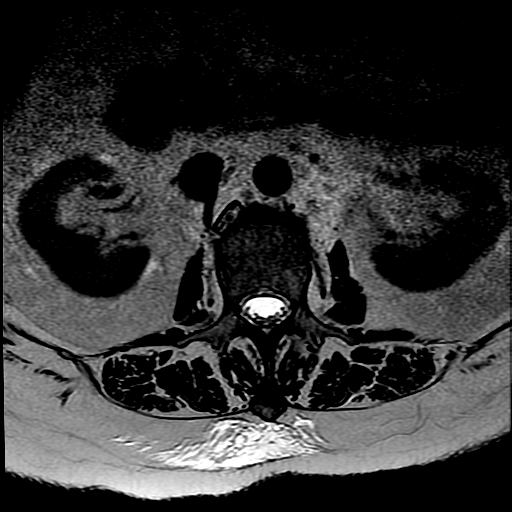
[im 33/33]
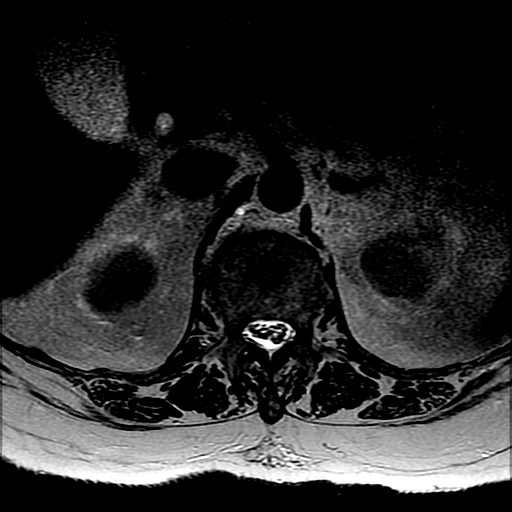

[Series 16: T2 · sagittal · 3.0mm · 0.62mm/px · 3 of 15 slices shown (3 of 4)]
[im 1/15]
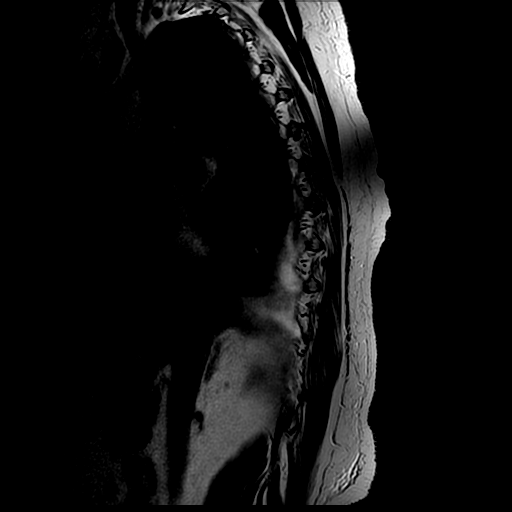
[im 8/15]
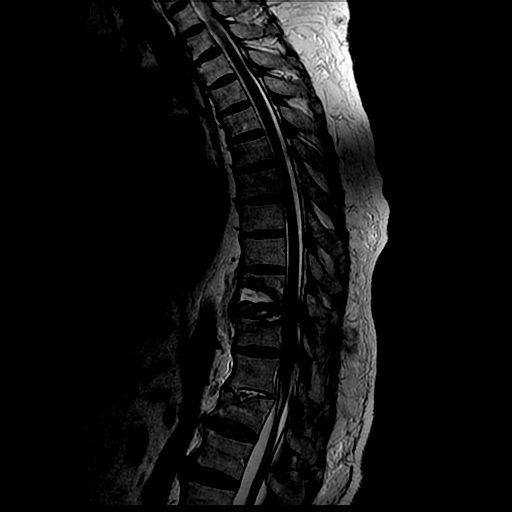
[im 15/15]
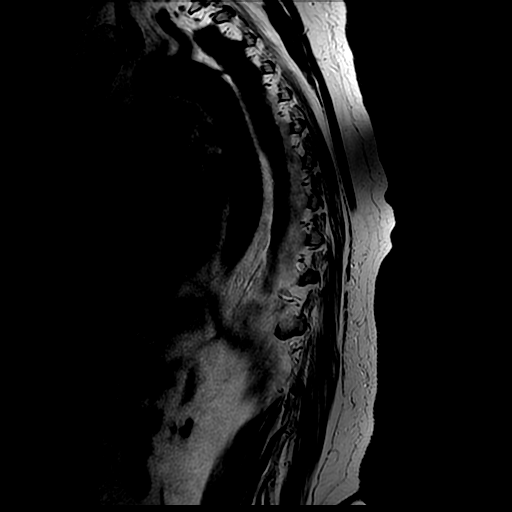

[Series 17: T2 · axial · 4.0mm · 0.43mm/px · z∈[-177,-32]mm · 5 of 41 slices shown (4 of 4)]
[im 1/41]
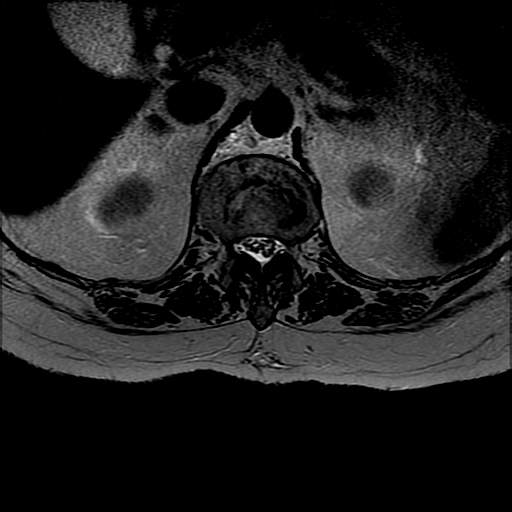
[im 6/41]
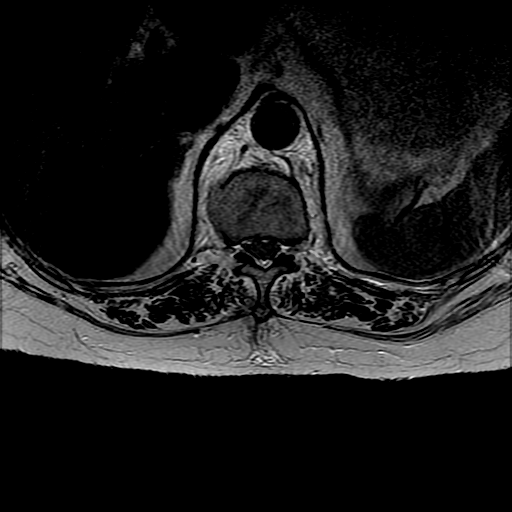
[im 12/41]
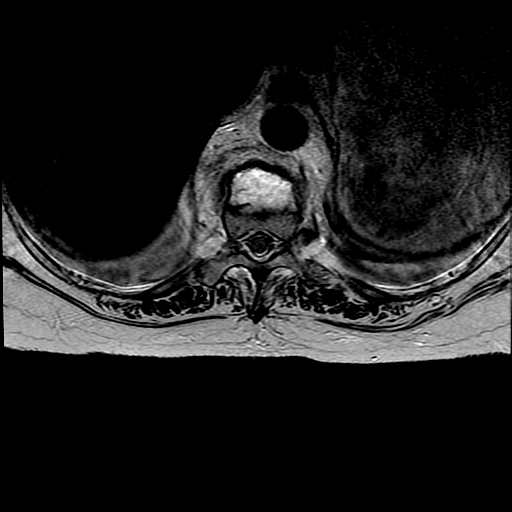
[im 23/41]
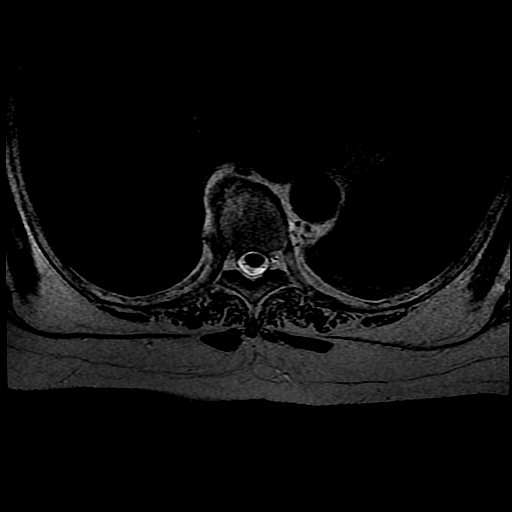
[im 35/41]
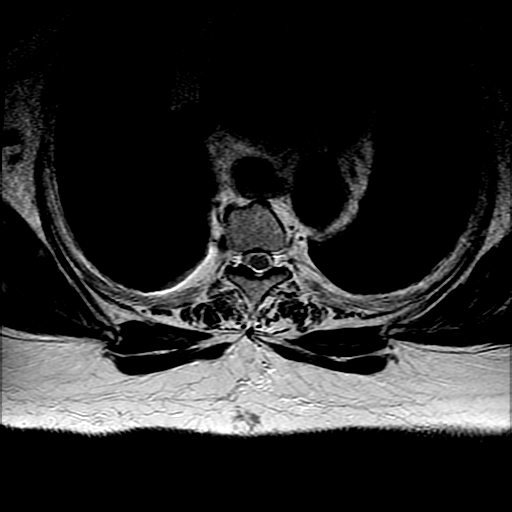

[17 of 48 positions shown; findings below may reference images not displayed]

FINDINGS: MRI THORACIC SPINE FINDINGS

Alignment:  Physiologic.

Vertebrae: A fracture in the inferior aspect of T10 extending to the
inferior endplate is noted with fluid attenuation within the
inferior fragment which does not appear significantly displaced. No
significant loss of vertebral body height. There is also a fracture
of the superior endplate of T11 with loss of approximately 25% of
the vertebral body height. Marrow edema is noted in both T10 and T11
vertebral bodies extending into the pedicles bilaterally, consistent
with acute fracture. No retropulsion seen.

Cord:  Normal signal and morphology.

Paraspinal and other soft tissues: 1 cm lesion within the right
adrenal gland with signal similar to fat on the T2 and gradient echo
sequences, suggestive of adrenal myelolipoma. Small bilateral
pleural effusions

Disc levels:

T1-2 through T9-10: No significant spinal canal or neural foraminal
stenosis.

T10-11: Facet degenerative changes and ligamentum flavum redundancy
resulting in mild bilateral.

T11-12: Facet degenerative changes and ligamentum flavum redundancy
without significant spinal canal or neural foraminal stenosis.

MRI LUMBAR SPINE FINDINGS

Segmentation:  Standard.

Alignment:  Physiologic.

Vertebrae: Chronic compression fracture of the L1 vertebral body
resulting in approximately 45% loss of vertebral body height without
associated marrow edema or retropulsion.

Conus medullaris and cauda equina: Conus extends to the T12-L1
level. Conus and cauda equina appear normal.

Paraspinal and other soft tissues: Negative.

Disc levels:

T12-L1: Small posterior disc osteophyte complex and mild facet
degenerative changes without significant spinal canal or neural
foraminal stenosis.

L1-2: Mild facet degenerative changes. No significant spinal canal
or neural foraminal stenosis.

L2-3: Mild facet degenerative changes. No spinal canal or neural
foraminal stenosis.

L3-4: Mild facet degenerative changes. No spinal canal or neural
foraminal stenosis.

L4-5: Disc bulge, prominent facet degenerative changes and
ligamentum flavum redundancy resulting in mild spinal canal stenosis
with effacement of the bilateral subarticular zone and mild
bilateral neural foraminal narrowing.

L5-S1: Disc calcification, shallow disc bulge and facet degenerative
changes with ligamentum flavum redundancy resulting in narrowing of
the bilateral subarticular zones and mild bilateral neural foraminal
narrowing.
IMPRESSION: 1. Acute fracture of the T10 and T11 vertebral bodies with loss of
approximately 25% of vertebral body height at T11. No retropulsion
or cord compression seen.
2. Chronic compression fracture of the L1 vertebral body resulting
in approximately 45% loss of vertebral body height without
associated marrow edema or retropulsion.
3. Mild degenerative changes with narrowing of the subarticular
zones and mild bilateral neural foraminal narrowing at L4-5 and
L5-S1.
4. Small bilateral pleural effusions.

## 2020-02-08 IMAGING — MR MR LUMBAR SPINE W/O CM
4 of 11 series · 17 of 48 positions shown · non-contrast
Comparison: CT of the thoracolumbar spine [DATE]

CLINICAL DATA: Fracture, hip.  History of fall.

EXAM:
MRI THORACIC AND LUMBAR SPINE WITHOUT CONTRAST
TECHNIQUE: Multiplanar and multiecho pulse sequences of the thoracic and lumbar
spine were obtained without intravenous contrast.

[Series 11: T2 · sagittal · 4.0mm · 0.55mm/px · 3 of 16 slices shown (1 of 4)]
[im 1/16]
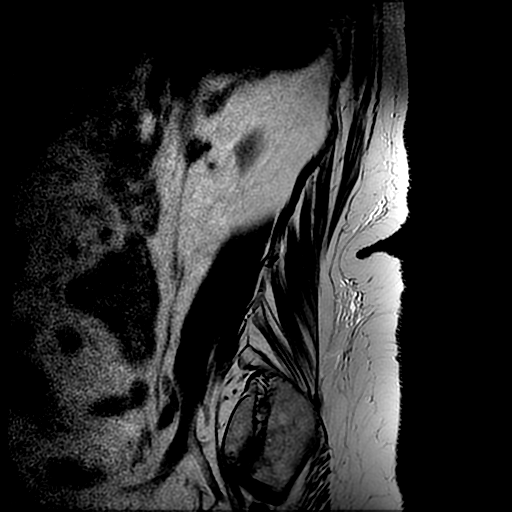
[im 8/16]
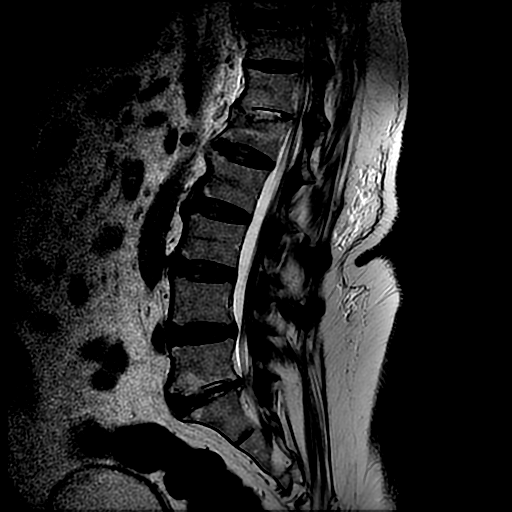
[im 16/16]
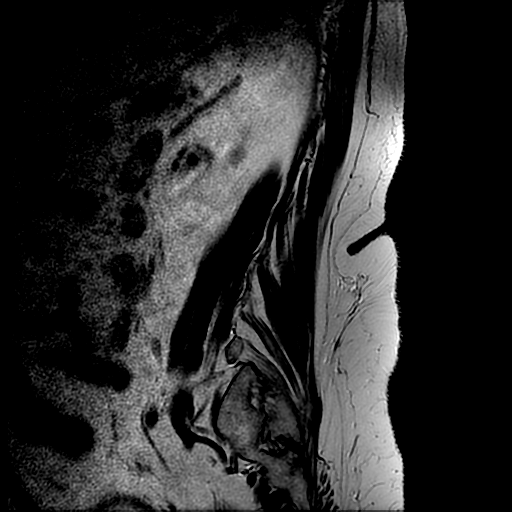

[Series 14: T2 · axial · 4.0mm · 0.39mm/px · z∈[-365,-180]mm · 6 of 33 slices shown (2 of 4)]
[im 1/33]
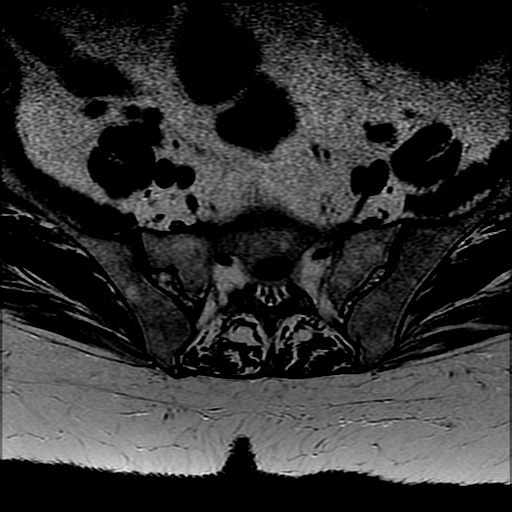
[im 7/33]
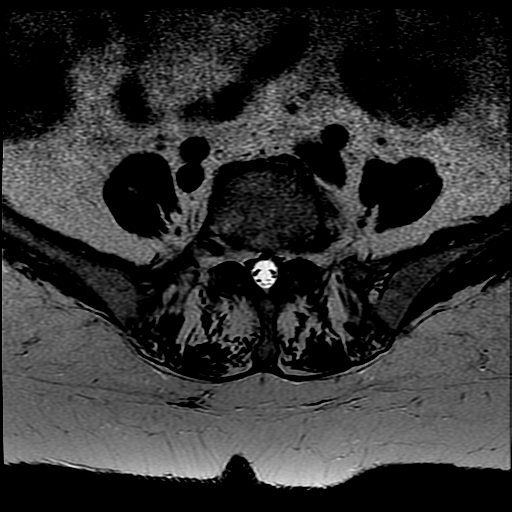
[im 13/33]
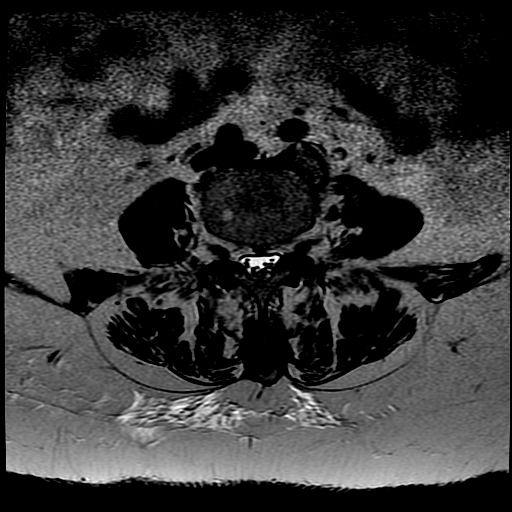
[im 20/33]
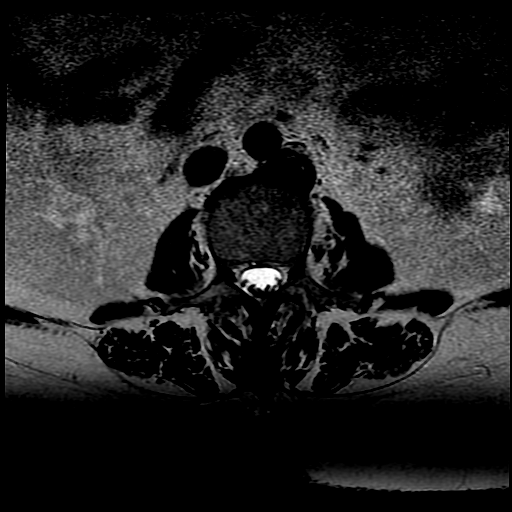
[im 26/33]
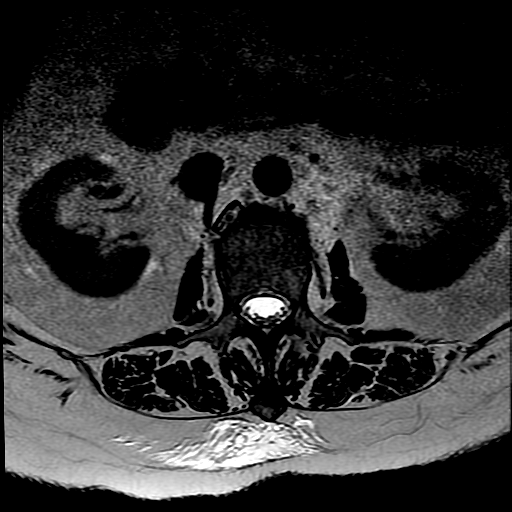
[im 33/33]
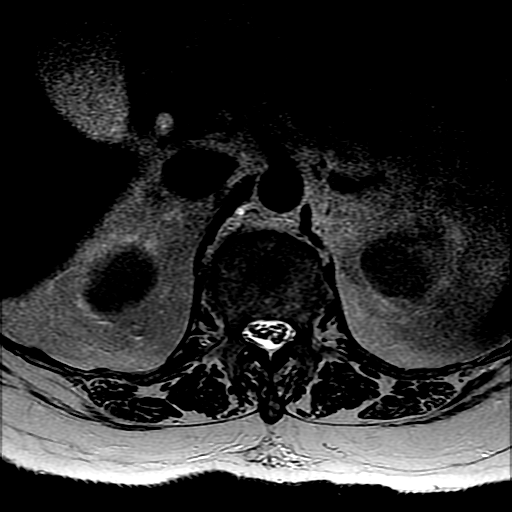

[Series 16: T2 · sagittal · 3.0mm · 0.62mm/px · 3 of 15 slices shown (3 of 4)]
[im 1/15]
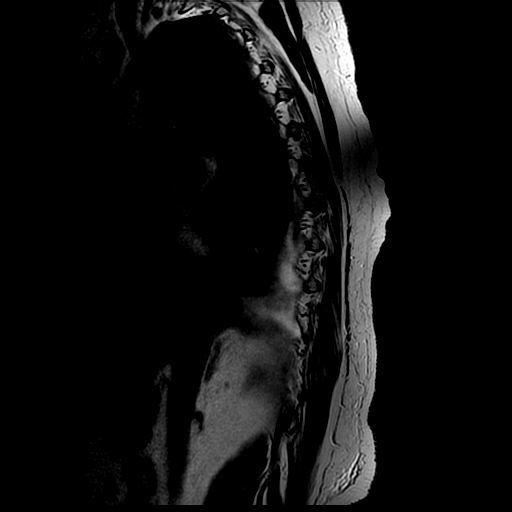
[im 8/15]
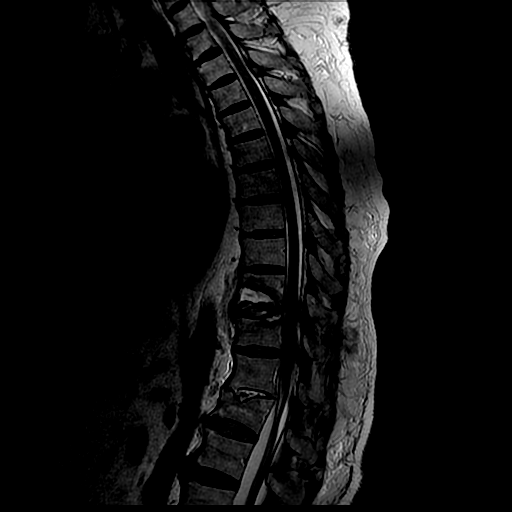
[im 15/15]
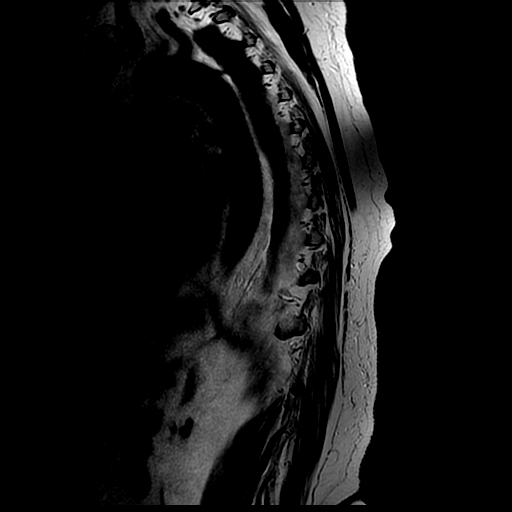

[Series 17: T2 · axial · 4.0mm · 0.43mm/px · z∈[-177,-32]mm · 5 of 41 slices shown (4 of 4)]
[im 1/41]
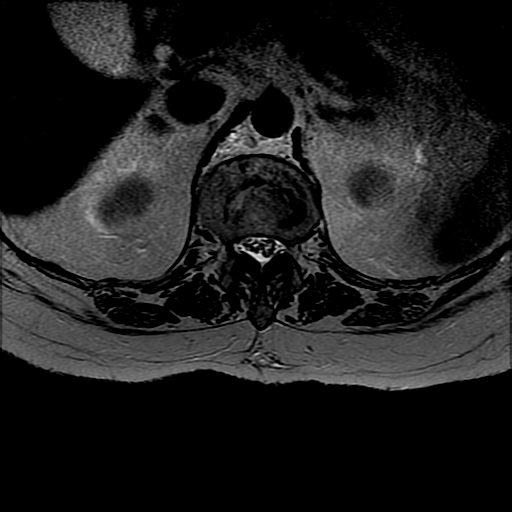
[im 6/41]
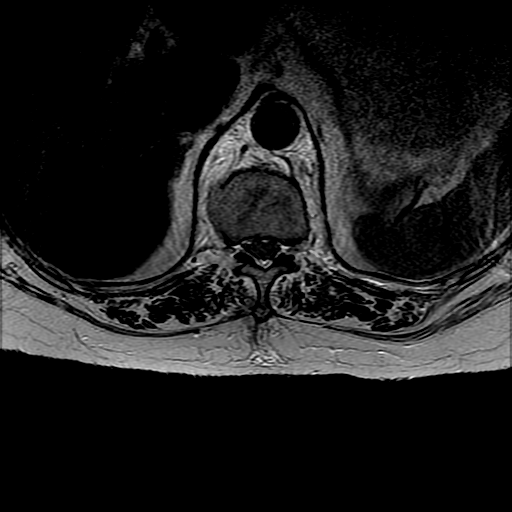
[im 12/41]
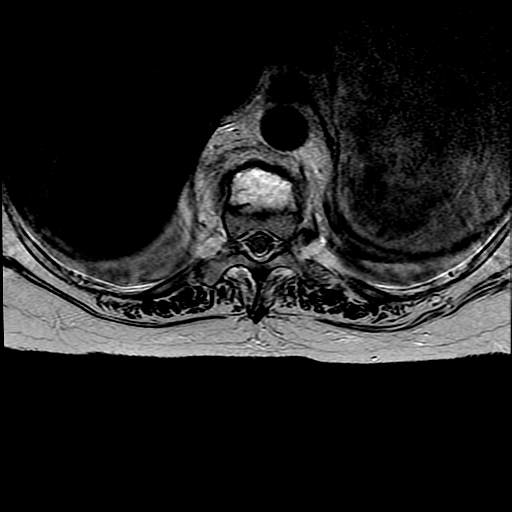
[im 23/41]
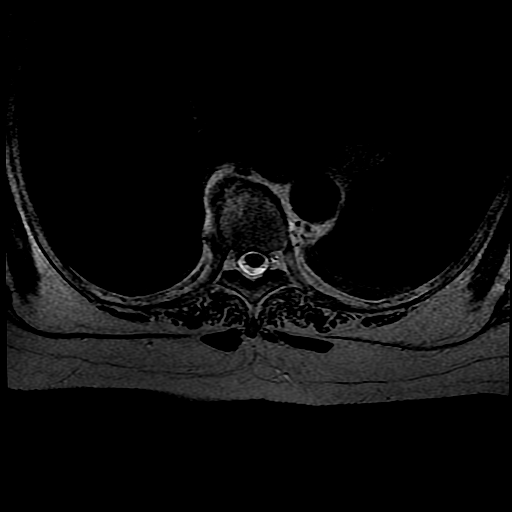
[im 35/41]
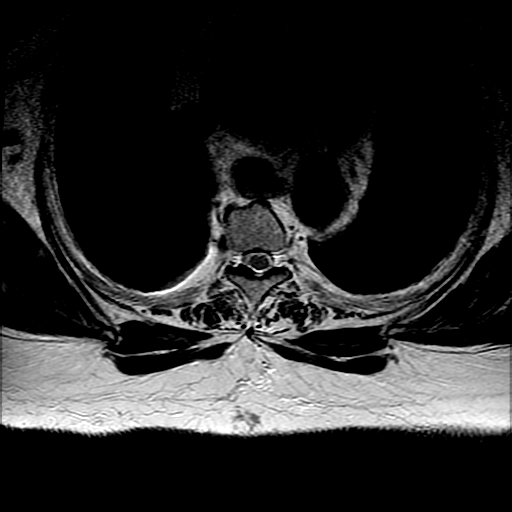

[17 of 48 positions shown; findings below may reference images not displayed]

FINDINGS: MRI THORACIC SPINE FINDINGS

Alignment:  Physiologic.

Vertebrae: A fracture in the inferior aspect of T10 extending to the
inferior endplate is noted with fluid attenuation within the
inferior fragment which does not appear significantly displaced. No
significant loss of vertebral body height. There is also a fracture
of the superior endplate of T11 with loss of approximately 25% of
the vertebral body height. Marrow edema is noted in both T10 and T11
vertebral bodies extending into the pedicles bilaterally, consistent
with acute fracture. No retropulsion seen.

Cord:  Normal signal and morphology.

Paraspinal and other soft tissues: 1 cm lesion within the right
adrenal gland with signal similar to fat on the T2 and gradient echo
sequences, suggestive of adrenal myelolipoma. Small bilateral
pleural effusions

Disc levels:

T1-2 through T9-10: No significant spinal canal or neural foraminal
stenosis.

T10-11: Facet degenerative changes and ligamentum flavum redundancy
resulting in mild bilateral.

T11-12: Facet degenerative changes and ligamentum flavum redundancy
without significant spinal canal or neural foraminal stenosis.

MRI LUMBAR SPINE FINDINGS

Segmentation:  Standard.

Alignment:  Physiologic.

Vertebrae: Chronic compression fracture of the L1 vertebral body
resulting in approximately 45% loss of vertebral body height without
associated marrow edema or retropulsion.

Conus medullaris and cauda equina: Conus extends to the T12-L1
level. Conus and cauda equina appear normal.

Paraspinal and other soft tissues: Negative.

Disc levels:

T12-L1: Small posterior disc osteophyte complex and mild facet
degenerative changes without significant spinal canal or neural
foraminal stenosis.

L1-2: Mild facet degenerative changes. No significant spinal canal
or neural foraminal stenosis.

L2-3: Mild facet degenerative changes. No spinal canal or neural
foraminal stenosis.

L3-4: Mild facet degenerative changes. No spinal canal or neural
foraminal stenosis.

L4-5: Disc bulge, prominent facet degenerative changes and
ligamentum flavum redundancy resulting in mild spinal canal stenosis
with effacement of the bilateral subarticular zone and mild
bilateral neural foraminal narrowing.

L5-S1: Disc calcification, shallow disc bulge and facet degenerative
changes with ligamentum flavum redundancy resulting in narrowing of
the bilateral subarticular zones and mild bilateral neural foraminal
narrowing.
IMPRESSION: 1. Acute fracture of the T10 and T11 vertebral bodies with loss of
approximately 25% of vertebral body height at T11. No retropulsion
or cord compression seen.
2. Chronic compression fracture of the L1 vertebral body resulting
in approximately 45% loss of vertebral body height without
associated marrow edema or retropulsion.
3. Mild degenerative changes with narrowing of the subarticular
zones and mild bilateral neural foraminal narrowing at L4-5 and
L5-S1.
4. Small bilateral pleural effusions.

## 2020-02-08 MED ORDER — SENNOSIDES-DOCUSATE SODIUM 8.6-50 MG PO TABS
2.0000 | ORAL_TABLET | Freq: Two times a day (BID) | ORAL | Status: DC
  Administered 2020-02-09 – 2020-02-12 (×5): 2 via ORAL
  Filled 2020-02-08 (×8): qty 2

## 2020-02-08 MED ORDER — SODIUM CHLORIDE 0.9 % IV SOLN: INTRAVENOUS | Status: AC

## 2020-02-08 MED ORDER — SODIUM CHLORIDE 0.9 % IV SOLN: INTRAVENOUS | Status: DC

## 2020-02-08 NOTE — Consult Note (Signed)
Subjective:   Patient is a 74 y.o. female with Afib, DM, CHF had a fall after an episode of hypotension with resulting back and hip pain.  She was found to have pubic rami fx as well as a T10 fracture, possible L1 fracture.  No weakness, numbness, tingling in her LEs or bowel/bladder changes.    Patient Active Problem List   Diagnosis Date Noted  . Closed fracture of multiple pubic rami, right, initial encounter ([place]) 02/07/2020  . Atrial fibrillation, chronic ([place]) 02/07/2020  . Chronic combined systolic (congestive) and diastolic (congestive) heart failure ([place])   . Acute respiratory failure with hypoxia ([place]) 10/18/2018  . Declining mobility 09/23/2018  . B12 deficiency 09/23/2018  . Morbid obesity with BMI of 40.0-44.9, adult ([place]) 09/23/2018  . Anemia 09/23/2018  . History of cardioversion 04/26/2018  . Type 2 diabetes mellitus without complication, without long-term current use of insulin ([place]) 03/15/2018  . Atrial fibrillation with RVR ([place]) 03/15/2018  . Morbid obesity ([place]) 01/04/2017  . Hyperlipidemia associated with type 2 diabetes mellitus ([place]) 01/04/2017  . Vitamin D deficiency 12/15/2016  . Glaucoma   . Hypertension associated with diabetes ([place]) 04/19/2014  . Hypothyroidism 04/19/2014  . History of endometrial cancer 03/30/2014   Past Medical History:  Diagnosis Date  . Cancer ([place])   . Cataract   . Glaucoma   . Hypertension   . Thyroid disease     Past Surgical History:  Procedure Laterality Date  . CARDIOVERSION N/A 04/08/2018   Procedure: CARDIOVERSION;  Surgeon: Adrian Prows, MD;  Location: [facility] ENDOSCOPY;  Service: Cardiovascular;  Laterality: N/A;  . CARDIOVERSION N/A 05/22/2019   Procedure: CARDIOVERSION;  Surgeon: Adrian Prows, MD;  Location: [place];  Service: Cardiovascular;  Laterality: N/A;  . FOOT SURGERY Left   . HYSTEROSCOPY     POLYPECTOMY    Medications Prior to Admission  Medication Sig Dispense Refill Last Dose  . atorvastatin (LIPITOR)  10 MG tablet Take 1 tablet (10 mg total) by mouth daily. 90 tablet 3 02/06/2020 at Unknown time  . cholecalciferol (VITAMIN D) 1000 UNITS tablet Take 5,000 Units by mouth See admin instructions. Sun, Mon, Wed, friday   02/05/2020 at Unknown time  . cyanocobalamin (,VITAMIN B-12,) 1000 MCG/ML injection 1000 mcg (1 mg) injection once per week for four weeks, followed by 1000 mcg injection once per month. (Patient taking differently: Inject 1,000 mcg into the muscle every 30 (thirty) days. ) 30 mL 3 May  . diltiazem (CARDIZEM CD) 180 MG 24 hr capsule Take 1 capsule (180 mg total) by mouth daily. 30 capsule 2 02/06/2020 at Unknown time  . dorzolamide-timolol (COSOPT) 22.3-6.8 MG/ML ophthalmic solution Place 1 drop into both eyes 2 (two) times daily.    02/06/2020 at Unknown time  . furosemide (LASIX) 40 MG tablet Take 1 tablet (40 mg total) by mouth daily. 90 tablet 1 02/06/2020 at Unknown time  . levothyroxine (SYNTHROID) 75 MCG tablet Take 1 tablet (75 mcg total) by mouth daily before breakfast. 90 tablet 3 02/06/2020 at Unknown time  . metFORMIN (GLUCOPHAGE) 1000 MG tablet Take 1 tablet (1,000 mg total) by mouth daily with breakfast. 90 tablet 3 02/06/2020 at Unknown time  . metoprolol succinate (TOPROL-XL) 100 MG 24 hr tablet Take 1 tablet (100 mg total) by mouth daily. Take with or immediately following a meal. 90 tablet 1 02/06/2020 at 1030  . Rivaroxaban (XARELTO) 15 MG TABS tablet Take 1 tablet (15 mg total) by mouth daily with supper. (Patient taking differently:  Take 15 mg by mouth daily. ) 90 tablet 2 02/06/2020 at 1030  . travoprost, benzalkonium, (TRAVATAN) 0.004 % ophthalmic solution Place 1 drop into both eyes at bedtime.    02/05/2020 at Unknown time  . valsartan (DIOVAN) 80 MG tablet Take 80 mg by mouth daily.   02/06/2020 at Unknown time   No Known Allergies  Social History   Tobacco Use  . Smoking status: Never Smoker  . Smokeless tobacco: Never Used  Substance Use Topics  . Alcohol use: No     Family History  Problem Relation Age of Onset  . Diabetes Son   . Healthy Mother   . Healthy Father     Review of Systems 10 of 12 systems reviewed  Objective:   No data found. No intake/output data recorded. No intake/output data recorded.  Awake, alert, NAD Neck supple Breathing comfortably Ext wwp abd soft Mild tenderness mid-back 5/5 strength HF, KE, DF, PF bilaterally Sensory grossly intact  Data Review  CT T and L spine reviewed.  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  There is superior L1 endplate of uncertain chronicity.  At T10, there is a fracture through the lower anterior body into the inferior endplate.  Unfortunately, the CT did not include the entire thoracic spine, but from what I can see, the ossified posterior ligamentous complex appears intact, alignment intact.  Assessment:   Principal Problem:   Closed fracture of multiple pubic rami, right, initial encounter ([place]) Active Problems:   Hypertension associated with diabetes ([place])   Hypothyroidism   Hyperlipidemia associated with type 2 diabetes mellitus ([place])   Type 2 diabetes mellitus without complication, without long-term current use of insulin ([place])   Morbid obesity with BMI of 40.0-44.9, adult (HCC)   Chronic combined systolic (congestive) and diastolic (congestive) heart failure (HCC)   Atrial fibrillation, chronic (HCC)   Plan:   74 yo F with DM, CHF, Afib and ankylosis of the spine who suffered a acute T10 body fracture.  From what I can see on the CT, it does not require surgery, and would heal appropriately with TLSO brace therapy to be worn when OOB for 3 months.  I would like to get an MRI to confirm the posterior tension band is intact prior to finalizing this plan.  The age indeterminate L1 fracture will likely heal well with or without bracing.  She would likely benefit from more aggressive osteoporosis/osteopenia therapy as well.  I discussed the treatment plan with the son and patient who  verbalized understanding and agreement.

## 2020-02-08 NOTE — Progress Notes (Addendum)
PROGRESS NOTE  Nixon Sparr GGY:[id number] DOB: Jan 09, 1946 DOA: 02/06/2020 PCP: [name], PA  HPI/Recap of past 24 hours: Lova Burlene Montecalvo is a 74 y.o. female with medical history significant of hypothyroidism; HTN; afib on Xarelto; chronic combined CHF; and glaucoma presenting with back pain and hypotension.  She reports falling on 01/31/20 and having ongoing back and R hip pain.  She has had increased difficulty walking even with her walker.  She is feeling better at this time.  She is having normal urination and BMs.  No numbness/weakness/tingling of her legs. Hospitalist team asked to admit.  Seen by Ortho Dr. Marlou Sa.  Also seen by neurosurgery, Dr. Marcello Moores.  02/08/20: Seen and examined at her bedside.  Pelvic pain is about 8 out of 10 prior to taking her pain medication.  CT TL spine revealed T10 and T11 vertebral fracture as well as ankylosis finding L1-2 and L5-S1.  Neurosurgery consulted.  Recommended thoracic and lumbar MRI for further evaluation as well as TLSO brace when out of bed.  Pain control in place.   Assessment/Plan: Principal Problem:   Closed fracture of multiple pubic rami, right, initial encounter ([place]) Active Problems:   Hypertension associated with diabetes ([place])   Hypothyroidism   Hyperlipidemia associated with type 2 diabetes mellitus ([place])   Type 2 diabetes mellitus without complication, without long-term current use of insulin ([place])   Morbid obesity with BMI of 40.0-44.9, adult (HCC)   Chronic combined systolic (congestive) and diastolic (congestive) heart failure (HCC)   Atrial fibrillation, chronic ([place])  Post fall 01/31/20/Acute T10 T11 vertebral fractures/Diffuse ankylosis of the spine -Patient with a fall on 6/16; she was seen in the ER with xrays of thoracic and lumbar spine which were unrevealing. -Her cousin is a physician in [place], recommends CT of lumbar spine and thoracic  -CT scan done on 02/07/2020 showed T10-T11 vertebral fractures and  lumbar spondylosis with moderate spinal stenosis at L4-L5, chronic mild L1 compression fracture on CT scan. -Seen by orthopedic surgery and neurosurgery -MRI thoracic and lumbar ordered and pending, follow results. -Conservative management recommended with TLSO brace when out of bed x3 months -Pain control in place, add bowel regimen -TOC team consult for [facility] support vs SNF  AKI on CKD 3A likely prerenal in the setting of dehydration Baseline creatinine appears to be 0.9 with GFR 57 Presented with creatinine of 1.4 with GFR of 37. Renal function is back to baseline. She is on p.o. Lasix 40 mg daily at home, hold. Valsartan also on hold due to AKI. Renal ultrasound shows bilateral renal cortical atrophy, small amount of nonspecific right perinephric fluid.  No evidence of renal mass or hydronephrosis. UA Negative for proteinuria and pyuria. Avoid nephrotoxins and hypotension Start gentle IV fluid hydration normal saline at 50 cc/h x 12 hours. Monitor urine output and volume status while on IV fluid in the setting of chronic diastolic CHF. Repeat BMP in the morning  Acute blood loss anemia in the setting of acute fractures Drop in hemoglobin from 11 to 9K Xarelto on hold Continue to monitor H&H Repeat CBC in the morning  Permanent Afib Rate controlled on Toprol and Cardizem On Xarelto for CVA prevention, held in the setting of acute fractures as recommended by orthopedic surgery. Continue to monitor  Essential hypertension  Initially hypotensive Blood pressure is currently stable She is on diltiazem and Toprol-XL, continue Valsartan and Lasix on hold due to AKI. Continue to monitor vital signs  Chronic diastolic CHF -0/3/50 echo  with preserved EF and indeterminate diastolic function -Appears compensated at this time -BNP 171 on admission from 397 in March -Euvolemic on exam -Lasix on hold, started gentle IV fluid hydration for 12 hours. -Closely monitor volume status while  on IV fluids -Strict I's and O's and daily weight -Continue cardiac medications as indicated above.  Resolved leukocytosis, likely reactive in the setting of acute fractures UA negative, chest x-ray unremarkable. Leukocytosis has resolved Afebrile with no evidence of active infective process.  HLD -Continue Lipitor  Type 2 diabetes with hyperglycemia -A1c was 6.8 on 11/29/19 -Continue to hold off oral hypoglycemics -Continue moderate insulin sliding scale  Morbid obesity -Weight loss should be encouraged outpatient  Hypothyroidism -Recent normal TSH -Continue Synthroid at home dose.  Glaucoma -Continue Cosopt, Travatan  Ambulatory dysfunction  PT OT to assess, follow orthopedic surgery and neurosurgery's recommendations Continue fall precautions    DVT prophylaxis:  SCDs, Xarelto on hold, she takes for permanent A. fib. Code Status:  DNR - confirmed with patient Family Communication: None present at bedside.    Consults called: Orthopedics; Nephrology; neurosurgery.    Status is: Inpatient    Dispo: The patient is from: Home.              Anticipated d/c is to: Home with home health services.               Anticipated d/c date is: 02/10/2020.               Patient currently not appropriate for discharge at this time due to ongoing work-up and persistent symptomatology.        Objective: Vitals:   02/07/20 2018 02/07/20 2019 02/07/20 2123 02/08/20 1037  BP:  (!) 111/51 128/66 120/69  Pulse:  74 (!) 59 73  Resp:  18 18 17   Temp: 98.3 F (36.8 C) 98.3 F (36.8 C) 98.4 F (36.9 C) 98.7 F (37.1 C)  TempSrc:   Oral Oral  SpO2:  97% 97% 95%   No intake or output data in the 24 hours ending 02/08/20 1229 There were no vitals filed for this visit.  Exam:  . General: 74 y.o. year-old female pleasant, obese in no acute distress.  Alert and oriented x3. . Cardiovascular: Irregular rate and rhythm with no rubs or gallops.  No thyromegaly or JVD  noted.   Marland Kitchen Respiratory: Clear to auscultation with no wheezes or rales. Good inspiratory effort. . Abdomen: Soft nontender nondistended with normal bowel sounds x4 quadrants. . Musculoskeletal: No lower extremity edema. 2/4 pulses in all 4 extremities. . Skin: No ulcerative lesions noted or rashes, . Psychiatry: Mood is appropriate for condition and setting   Data Reviewed: CBC: Recent Labs  Lab 02/06/20 1819 02/08/20 0430  WBC 15.1* 10.2  HGB 11.3* 9.1*  HCT 39.4 31.2*  MCV 92.1 92.0  PLT 446* 329   Basic Metabolic Panel: Recent Labs  Lab 02/06/20 1819 02/08/20 0430  NA 134* 138  K 4.6 4.6  CL 97* 103  CO2 24 28  GLUCOSE 200* 152*  BUN 26* 18  CREATININE 1.41* 0.98  CALCIUM 9.2 8.8*   GFR: Estimated Creatinine Clearance: 48.3 mL/min (by C-G formula based on SCr of 0.98 mg/dL). Liver Function Tests: No results for input(s): AST, ALT, ALKPHOS, BILITOT, PROT, ALBUMIN in the last 168 hours. No results for input(s): LIPASE, AMYLASE in the last 168 hours. No results for input(s): AMMONIA in the last 168 hours. Coagulation Profile: No results for input(s): INR, PROTIME  in the last 168 hours. Cardiac Enzymes: No results for input(s): CKTOTAL, CKMB, CKMBINDEX, TROPONINI in the last 168 hours. BNP (last 3 results) No results for input(s): PROBNP in the last 8760 hours. HbA1C: No results for input(s): HGBA1C in the last 72 hours. CBG: Recent Labs  Lab 02/07/20 1202 02/07/20 1329 02/07/20 1925 02/07/20 2140 02/08/20 0842  GLUCAP 152* 195* 131* 179* 157*   Lipid Profile: No results for input(s): CHOL, HDL, LDLCALC, TRIG, CHOLHDL, LDLDIRECT in the last 72 hours. Thyroid Function Tests: No results for input(s): TSH, T4TOTAL, FREET4, T3FREE, THYROIDAB in the last 72 hours. Anemia Panel: No results for input(s): VITAMINB12, FOLATE, FERRITIN, TIBC, IRON, RETICCTPCT in the last 72 hours. Urine analysis:    Component Value Date/Time   COLORURINE YELLOW 02/07/2020 0104     APPEARANCEUR CLOUDY (A) 02/07/2020 0104   LABSPEC 1.019 02/07/2020 0104   PHURINE 5.0 02/07/2020 0104   GLUCOSEU NEGATIVE 02/07/2020 0104   HGBUR NEGATIVE 02/07/2020 0104   BILIRUBINUR NEGATIVE 02/07/2020 0104   KETONESUR NEGATIVE 02/07/2020 0104   PROTEINUR NEGATIVE 02/07/2020 0104   NITRITE NEGATIVE 02/07/2020 0104   LEUKOCYTESUR NEGATIVE 02/07/2020 0104   Sepsis Labs: @LABRCNTIP (procalcitonin:4,lacticidven:4)  ) Recent Results (from the past 240 hour(s))  SARS Coronavirus 2 by RT PCR (hospital order, performed in [place] hospital lab) Nasopharyngeal Nasopharyngeal Swab     Status: None   Collection Time: 02/07/20  3:57 PM   Specimen: Nasopharyngeal Swab  Result Value Ref Range Status   SARS Coronavirus 2 NEGATIVE NEGATIVE Final    Comment: (NOTE) SARS-CoV-2 target nucleic acids are NOT DETECTED.  The SARS-CoV-2 RNA is generally detectable in upper and lower respiratory specimens during the acute phase of infection. The lowest concentration of SARS-CoV-2 viral copies this assay can detect is 250 copies / mL. A negative result does not preclude SARS-CoV-2 infection and should not be used as the sole basis for treatment or other patient management decisions.  A negative result may occur with improper specimen collection / handling, submission of specimen other than nasopharyngeal swab, presence of viral mutation(s) within the areas targeted by this assay, and inadequate number of viral copies (<250 copies / mL). A negative result must be combined with clinical observations, patient history, and epidemiological information.  Fact Sheet for Patients:   StrictlyIdeas.no  Fact Sheet for Healthcare Providers: BankingDealers.co.za  This test is not yet approved or  cleared by the [place] FDA and has been authorized for detection and/or diagnosis of SARS-CoV-2 by FDA under an Emergency Use Authorization (EUA).  This EUA  will remain in effect (meaning this test can be used) for the duration of the COVID-19 declaration under Section 564(b)(1) of the Act, 21 U.S.C. section 360bbb-3(b)(1), unless the authorization is terminated or revoked sooner.  Performed at [facility], [place] [address], [place], [place] [address]       Studies: CT THORACIC SPINE WO CONTRAST  Result Date: 02/07/2020 CLINICAL DATA:  Back pain after a fall. EXAM: CT THORACIC AND LUMBAR SPINE WITHOUT CONTRAST TECHNIQUE: Multidetector CT imaging of the thoracic and lumbar spine was performed without contrast. Multiplanar CT image reconstructions were also generated. COMPARISON:  Thoracic and lumbar spine radiographs 01/31/2020 FINDINGS: CT THORACIC SPINE FINDINGS Alignment: No listhesis. Vertebrae: Diffuse ankylosis of the thoracic spine by bridging syndesmophytes. Diffuse supraspinous ligament ossification from T3 to T12 with focal discontinuity at T7-8 which has a chronic appearance. There is a horizontal fracture through the inferior portion of the T10 vertebral body with  8 mm of distraction anteriorly. There is also a fracture of the T11 superior endplate with slight vertebral body height loss and with extension into the pedicles. Paraspinal and other soft tissues: Paravertebral soft tissue edema/hematoma at T10 and T11. Disc levels: No evidence of significant thoracic spinal canal stenosis. Bilateral neural foraminal stenosis at T10-11 due to facet spurring. Incompletely evaluated cervical spondylosis. CT LUMBAR SPINE FINDINGS Segmentation: 5 lumbar type vertebrae. Alignment: Normal. Vertebrae: Chronic mild L1 superior endplate compression fracture. Interbody ankylosis at L1-2 and L5-S1. Paraspinal and other soft tissues: No acute finding. Disc levels: Disc bulging, endplate spurring, and facet arthrosis resulting in mild spinal stenosis at L3-4 and moderate spinal stenosis at L4-5 as well as mild-to-moderate neural foraminal stenosis at L4-5  and L5-S1. IMPRESSION: 1. Acute T10 and T11 vertebral fractures as above in the setting of diffuse thoracic ankylosis (chalk stick fracture). 2. 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 3. Chronic mild L1 compression fracture. Electronically Signed   By: Logan Bores M.D.   On: 02/07/2020 18:17   CT LUMBAR SPINE WO CONTRAST  Result Date: 02/07/2020 CLINICAL DATA:  Back pain after a fall. EXAM: CT THORACIC AND LUMBAR SPINE WITHOUT CONTRAST TECHNIQUE: Multidetector CT imaging of the thoracic and lumbar spine was performed without contrast. Multiplanar CT image reconstructions were also generated. COMPARISON:  Thoracic and lumbar spine radiographs 01/31/2020 FINDINGS: CT THORACIC SPINE FINDINGS Alignment: No listhesis. Vertebrae: Diffuse ankylosis of the thoracic spine by bridging syndesmophytes. Diffuse supraspinous ligament ossification from T3 to T12 with focal discontinuity at T7-8 which has a chronic appearance. There is a horizontal fracture through the inferior portion of the T10 vertebral body with 8 mm of distraction anteriorly. There is also a fracture of the T11 superior endplate with slight vertebral body height loss and with extension into the pedicles. Paraspinal and other soft tissues: Paravertebral soft tissue edema/hematoma at T10 and T11. Disc levels: No evidence of significant thoracic spinal canal stenosis. Bilateral neural foraminal stenosis at T10-11 due to facet spurring. Incompletely evaluated cervical spondylosis. CT LUMBAR SPINE FINDINGS Segmentation: 5 lumbar type vertebrae. Alignment: Normal. Vertebrae: Chronic mild L1 superior endplate compression fracture. Interbody ankylosis at L1-2 and L5-S1. Paraspinal and other soft tissues: No acute finding. Disc levels: Disc bulging, endplate spurring, and facet arthrosis resulting in mild spinal stenosis at L3-4 and moderate spinal stenosis at L4-5 as well as mild-to-moderate neural foraminal stenosis at L4-5 and L5-S1.  IMPRESSION: 1. Acute T10 and T11 vertebral fractures as above in the setting of diffuse thoracic ankylosis (chalk stick fracture). 2. 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 3. Chronic mild L1 compression fracture. Electronically Signed   By: Logan Bores M.D.   On: 02/07/2020 18:17   CT PELVIS WO CONTRAST  Result Date: 02/07/2020 CLINICAL DATA:  Lower back pain. EXAM: CT PELVIS WITHOUT CONTRAST TECHNIQUE: Multidetector CT imaging of the pelvis was performed following the standard protocol without intravenous contrast. COMPARISON:  None. FINDINGS: Urinary Tract:  No abnormality visualized. Bowel:  Unremarkable visualized pelvic bowel loops. Vascular/Lymphatic: No pathologically enlarged lymph nodes. No significant vascular abnormality seen. Reproductive:  The uterus is surgically absent. Other:  None. Musculoskeletal: Marked severity degenerative changes are seen at the level of L5-S1. IMPRESSION: Marked severity degenerative changes at the level of L5-S1. Electronically Signed   By: Virgina Norfolk M.D.   On: 02/07/2020 15:57    Scheduled Meds: . atorvastatin  10 mg Oral Daily  . diltiazem  180 mg Oral Daily  .  docusate sodium  100 mg Oral BID  . dorzolamide-timolol  1 drop Both Eyes BID  . insulin aspart  0-15 Units Subcutaneous TID WC  . insulin aspart  0-5 Units Subcutaneous QHS  . latanoprost  1 drop Both Eyes QHS  . levothyroxine  75 mcg Oral QAC breakfast  . metoprolol succinate  100 mg Oral Daily    Continuous Infusions:   LOS: 1 day     Kayleen Memos, MD [facility] Pager [phone]  If 7PM-7AM, please contact night-coverage [email or web address] Password [facility] 02/08/2020, 12:29 PM

## 2020-02-08 NOTE — Progress Notes (Signed)
Orthopedic Tech Progress Note Patient Details:  Crystal Ashley [birth year] [id number] Called in brace Patient ID: Crystal Ashley, female   DOB: 10/02/1945, 74 y.o.   MRN: [id number]   Crystal Ashley 02/08/2020, 9:07 AM

## 2020-02-08 NOTE — Progress Notes (Signed)
Neurosurgery  MRI reviewed.  T10 body fracture redemonstrated as well as mild T11 body fracture.  There is some STIR signal seen in paraspinous musculature but no disruption of the posterior tension band.  Will continue with plan for TLSO brace treatment to be worn when out of bed.  She will need to f/u in neurosurgery clinic in 4 weeks with an upright x-ray.

## 2020-02-08 NOTE — Progress Notes (Addendum)
OT Cancellation Note  Patient Details Name: [name] MRN: [id number] DOB: [birth year]   Cancelled Treatment:    Reason Eval/Treat Not Completed: Other (comment). Pt awaiting neurosurgical consult to assess T10 fracture. Plan to hold OT eval until stability of fracture has been fully assessed.   Tyrone Schimke, OT Acute Rehabilitation Services Pager: [phone] Office: [phone]  02/08/2020, 9:09 AM   02/08/2020 11:32AM Attempted to see pt now that neurosurgery consult in. Pt is currently at MRI.

## 2020-02-08 NOTE — TOC Initial Note (Signed)
Transition of Care [facility]) - Initial/Assessment Note    Patient Details  Name: Crystal Ashley MRN: [id number] Date of Birth: 10-16-1945  Transition of Care [facility]) CM/SW Contact:    Curlene Labrum, RN Phone Number: 02/08/2020, 4:45 PM  Clinical Narrative:                 Case management met with the patient and son at the bedside regarding transitions of care for home.  The patient fell at home and has a pelvic fracture involving T10, T11.  Patient is pending PT/OT evaluation.  Will continue to follow for home health needs.  Expected Discharge Plan: [place] Barriers to Discharge: Continued Medical Work up   Patient Goals and CMS Choice Patient states their goals for this hospitalization and ongoing recovery are:: Patient fell at home and wants to get better and go home. CMS Medicare.gov Compare Post Acute Care list provided to:: Patient Choice offered to / list presented to : Patient  Expected Discharge Plan and Services Expected Discharge Plan: [place] In-house Referral: Clinical Social Work Discharge Planning Services: CM Consult Post Acute Care Choice: [place] arrangements for the past 2 months: Single Family Home                 DME Arranged: Walker rolling                    Prior Living Arrangements/Services Living arrangements for the past 2 months: Single Family Home Lives with:: Adult Children Patient language and need for interpreter reviewed:: Yes Do you feel safe going back to the place where you live?: Yes      Need for Family Participation in Patient Care: Yes (Comment) Care giver support system in place?: Yes (comment) Current home services:  (rolling walker at home) Criminal Activity/Legal Involvement Pertinent to Current Situation/Hospitalization: No - Comment as needed  Activities of Daily Living Home Assistive Devices/Equipment: None ADL Screening (condition at time of  admission) Patient's cognitive ability adequate to safely complete daily activities?: Yes Is the patient deaf or have difficulty hearing?: No Does the patient have difficulty seeing, even when wearing glasses/contacts?: No Does the patient have difficulty concentrating, remembering, or making decisions?: No Patient able to express need for assistance with ADLs?: Yes Does the patient have difficulty dressing or bathing?: No Independently performs ADLs?: Yes (appropriate for developmental age) Does the patient have difficulty walking or climbing stairs?: No Weakness of Legs: Both Weakness of Arms/Hands: Both  Permission Sought/Granted Permission sought to share information with : Case Manager Permission granted to share information with : Yes, Verbal Permission Granted     Permission granted to share info w AGENCY: home health agency  Permission granted to share info w Relationship: son     Emotional Assessment Appearance:: Appears stated age Attitude/Demeanor/Rapport: Gracious Affect (typically observed): Accepting Orientation: : Oriented to Self, Oriented to Place, Oriented to  Time, Oriented to Situation Alcohol / Substance Use: Not Applicable Psych Involvement: No (comment)  Admission diagnosis:  Unable to walk [R26.2] Transient hypotension [I95.9] Chronic renal disease, stage 3, moderately decreased glomerular filtration rate between 30-59 mL/min/1.73 square meter [N18.30] Closed fracture of multiple pubic rami, right, initial encounter ([place]) [S32.591A] Multiple closed fractures of pelvis with stable disruption of pelvic ring, initial encounter ([place]) [S32.810A] Patient Active Problem List   Diagnosis Date Noted  . Closed fracture of multiple pubic rami, right, initial encounter ([place]) 02/07/2020  . Atrial fibrillation,  chronic ([place]) 02/07/2020  . Chronic combined systolic (congestive) and diastolic (congestive) heart failure ([place])   . Acute respiratory failure with hypoxia  ([place]) 10/18/2018  . Declining mobility 09/23/2018  . B12 deficiency 09/23/2018  . Morbid obesity with BMI of 40.0-44.9, adult ([place]) 09/23/2018  . Anemia 09/23/2018  . History of cardioversion 04/26/2018  . Type 2 diabetes mellitus without complication, without long-term current use of insulin ([place]) 03/15/2018  . Atrial fibrillation with RVR ([place]) 03/15/2018  . Morbid obesity ([place]) 01/04/2017  . Hyperlipidemia associated with type 2 diabetes mellitus ([place]) 01/04/2017  . Vitamin D deficiency 12/15/2016  . Glaucoma   . Hypertension associated with diabetes ([place]) 04/19/2014  . Hypothyroidism 04/19/2014  . History of endometrial cancer 03/30/2014   PCP:  [name], PA Pharmacy:   [facility], [address] [place] [address] Phone: [phone] Fax: [phone]  [facility], [address] [place]. [place] [address] Phone: [phone] Fax: [phone]  [place], [place] [address] [place] [address] Phone: [phone] Fax: [phone]     Social Determinants of Health (SDOH) Interventions    Readmission Risk Interventions Readmission Risk Prevention Plan 02/08/2020  Transportation Screening Complete  PCP or Specialist Appt within 5-7 Days Complete  Home Care Screening Complete  Medication Review (RN CM) Complete  Some recent data might be hidden

## 2020-02-08 NOTE — Progress Notes (Signed)
PT Cancellation Note  Patient Details Name: Crystal Ashley MRN: [id number] DOB: September 17, 1945   Cancelled Treatment:    Reason Eval/Treat Not Completed: Patient at procedure or test/unavailable (MRI to confirm the posterior tension band is intact prior to finalizing this treatment plan). Will follow-up for PT Evaluation.  Mabeline Caras, PT, DPT Acute Rehabilitation Services  Pager [phone] Office [place] 02/08/2020, 12:17 PM

## 2020-02-08 NOTE — Progress Notes (Signed)
Patient examined.  Patient has 5 out of 5 ankle dorsiflexion plantarflexion and quad strength.  No saddle paresthesias. CT scan reviewed and she does have a T10 compression fracture in the setting of thoracic ankylosis.  Discussed with [facility] hospitalist holding Xarelto until neurosurgical consultation has been obtained.  I would also hold off on any type of transfers until the stability of that fracture can be fully assessed.  Regarding the pubic rami fracture she can be weightbearing as tolerated with a walker.  She will need to follow-up with [place] in about 3 weeks for clinical recheck on that.

## 2020-02-09 LAB — CBC
HCT: 32.7 % — ABNORMAL LOW (ref 36.0–46.0)
Hemoglobin: 9.5 g/dL — ABNORMAL LOW (ref 12.0–15.0)
MCH: 26.8 pg (ref 26.0–34.0)
MCHC: 29.1 g/dL — ABNORMAL LOW (ref 30.0–36.0)
MCV: 92.1 fL (ref 80.0–100.0)
Platelets: 324 10*3/uL (ref 150–400)
RBC: 3.55 MIL/uL — ABNORMAL LOW (ref 3.87–5.11)
RDW: 17.2 % — ABNORMAL HIGH (ref 11.5–15.5)
WBC: 11.9 10*3/uL — ABNORMAL HIGH (ref 4.0–10.5)
nRBC: 0 % (ref 0.0–0.2)

## 2020-02-09 LAB — GLUCOSE, CAPILLARY
Glucose-Capillary: 152 mg/dL — ABNORMAL HIGH (ref 70–99)
Glucose-Capillary: 213 mg/dL — ABNORMAL HIGH (ref 70–99)
Glucose-Capillary: 217 mg/dL — ABNORMAL HIGH (ref 70–99)
Glucose-Capillary: 218 mg/dL — ABNORMAL HIGH (ref 70–99)

## 2020-02-09 LAB — BASIC METABOLIC PANEL
Anion gap: 10 (ref 5–15)
BUN: 21 mg/dL (ref 8–23)
CO2: 27 mmol/L (ref 22–32)
Calcium: 8.7 mg/dL — ABNORMAL LOW (ref 8.9–10.3)
Chloride: 101 mmol/L (ref 98–111)
Creatinine, Ser: 0.95 mg/dL (ref 0.44–1.00)
GFR calc Af Amer: >60 mL/min (ref >60)
GFR calc non Af Amer: 59 mL/min — ABNORMAL LOW (ref >60)
Glucose, Bld: 165 mg/dL — ABNORMAL HIGH (ref 70–99)
Potassium: 5.1 mmol/L (ref 3.5–5.1)
Sodium: 138 mmol/L (ref 135–145)

## 2020-02-09 NOTE — Progress Notes (Signed)
PROGRESS NOTE  Crystal Ashley UXN:[id number] DOB: Aug 29, 1945 DOA: 02/06/2020 PCP: [name], PA  HPI/Recap of past 24 hours: Crystal Ashley is a 74 y.o. female with medical history significant of hypothyroidism; HTN; afib on Xarelto; chronic combined CHF; and glaucoma presenting with back pain and hypotension.  She reports falling on 01/31/20 and having ongoing back and R hip pain.  She has had increased difficulty walking even with her walker.  She is feeling better at this time.  She is having normal urination and BMs.  No numbness/weakness/tingling of her legs. Hospitalist team asked to admit.  Seen by Ortho Dr. Marlou Sa.  Also seen by neurosurgery, Dr. Marcello Moores.  CT TL spine revealed T10 and T11 vertebral fracture as well as ankylosis finding L1-2 and L5-S1.  Neurosurgery recommended thoracic and lumbar MRI for further evaluation as well as TLSO brace when out of bed.  No evidence of disruption of the posterior tension band on MRI thoracic and lumbar spine per neurosurgery.  She will need to follow-up in neurosurgery clinic in 4 weeks for an upright x-ray.  02/09/20: Seen and examined at her bedside this morning.  She reports 7 out of 10 pain in her lower back prior to receiving her pain medication.  Seen by OT, recommendation for CIR.   Assessment/Plan: Principal Problem:   Closed fracture of multiple pubic rami, right, initial encounter ([place]) Active Problems:   Hypertension associated with diabetes ([place])   Hypothyroidism   Hyperlipidemia associated with type 2 diabetes mellitus ([place])   Type 2 diabetes mellitus without complication, without long-term current use of insulin ([place])   Morbid obesity with BMI of 40.0-44.9, adult (HCC)   Chronic combined systolic (congestive) and diastolic (congestive) heart failure (HCC)   Atrial fibrillation, chronic ([place])  Post fall 01/31/20/Acute T10 T11 vertebral fractures/Diffuse ankylosis of the spine -Patient with a fall on 6/16; she was seen  in the ER with xrays of thoracic and lumbar spine which were unrevealing. -Her cousin is a physician in [place], recommends CT of thoracic and lumbar spine -CT scan done on 02/07/2020 showed T10-T11 vertebral fractures and lumbar spondylosis with moderate spinal stenosis at L4-L5, chronic mild L1 compression fracture on CT scan. -Seen by orthopedic surgery and neurosurgery -MRI thoracic and lumbar ordered  No evidence of disruption of the posterior tension band -Conservative management recommended with TLSO brace when out of bed x3 months -She will need to follow-up in neurosurgery clinic in 4 weeks for an upright x-ray. -Pain control in place, continue bowel regimen -OT recommend CIR -CIR consulted for placement  Resolved AKI on CKD 3A likely prerenal in the setting of dehydration Baseline creatinine appears to be 0.9 with GFR 57 Presented with creatinine of 1.4 with GFR of 37. Renal function is back to baseline. She is on p.o. Lasix 40 mg daily at home, hold. Valsartan also on hold due to AKI. Renal ultrasound shows bilateral renal cortical atrophy, small amount of nonspecific right perinephric fluid.  No evidence of renal mass or hydronephrosis. UA Negative for proteinuria and pyuria. Continue to avoid nephrotoxins and hypotension Completed gentle IV fluid hydration normal saline at 50 cc/h x 12 hours. Off IV fluid, encourage oral intake Repeat BMP in the morning  Acute blood loss anemia in the setting of acute fractures Hemoglobin from 11 to 9 K to 9.5 K Xarelto on hold Continue to monitor H&H Repeat CBC in the morning  Permanent Afib Rate controlled on Toprol and Cardizem On Xarelto for CVA prevention,  held in the setting of acute fractures as recommended by orthopedic surgery. Continue to monitor  Essential hypertension  Initially hypotensive Blood pressure is currently stable She is on diltiazem and Toprol-XL, continue Valsartan and Lasix on hold due to AKI. Continue  to monitor vital signs  Chronic diastolic CHF -4/0/81 echo with preserved EF and indeterminate diastolic function -Appears compensated at this time -BNP 171 on admission from 397 in March -Euvolemic on exam -Lasix on hold, received gentle IV fluid hydration for 12 hours on 02/08/2020. -Continue to closely monitor volume status while on IV fluids -Continue strict I's and O's and daily weight -Continue cardiac medications as indicated above.  Resolved leukocytosis, likely reactive in the setting of acute fractures UA negative, chest x-ray unremarkable. Leukocytosis has resolved Afebrile with no evidence of active infective process.  HLD -Continue Lipitor  Type 2 diabetes with hyperglycemia -A1c was 6.8 on 11/29/19 -Continue to hold off oral hypoglycemics -Continue moderate insulin sliding scale  Morbid obesity -Weight loss should be encouraged outpatient  Hypothyroidism -Recent normal TSH -Continue Synthroid at home dose.  Glaucoma -Continue [place], [place]  Ambulatory dysfunction  PT OT to assess, follow orthopedic surgery and neurosurgery's recommendations Continue fall precautions    DVT prophylaxis:  SCDs, Xarelto on hold, she takes for permanent A. fib. Code Status:  DNR - confirmed with patient Family Communication: None present at bedside.    Consults called: Orthopedics; Nephrology; neurosurgery.    Status is: Inpatient    Dispo: The patient is from: Home.              Anticipated d/c is to: Home with home health services.               Anticipated d/c date is: 02/10/2020.               Patient currently not appropriate for discharge at this time.  Awaiting CIR placement.       Objective: Vitals:   02/08/20 2123 02/09/20 0408 02/09/20 0600 02/09/20 1007  BP: 126/74 (!) 137/56  (!) 137/56  Pulse: 81 61  61  Resp: 20 18    Temp: 98.1 F (36.7 C) 97.7 F (36.5 C)    TempSrc: Oral Oral    SpO2: 97% 95%    Weight:   91.6 kg   Height:         Intake/Output Summary (Last 24 hours) at 02/09/2020 1312 Last data filed at 02/09/2020 0600 Gross per 24 hour  Intake 600 ml  Output 850 ml  Net -250 ml   Filed Weights   02/07/20 2200 02/09/20 0600  Weight: 83.8 kg 91.6 kg    Exam:  . General: 74 y.o. year-old female pleasant, obese in no acute distress.  Alert and oriented x3.  . Cardiovascular: Irregular rate and rhythm no rubs or gallops.   Marland Kitchen Respiratory: Clear to auscultation no wheezes or rales.   . Abdomen: Soft nontender normal bowel sounds present.   . Musculoskeletal: No lower extremity edema bilaterally. Marland Kitchen Psychiatry: Mood is appropriate for condition and setting.  Data Reviewed: CBC: Recent Labs  Lab 02/06/20 1819 02/08/20 0430 02/09/20 0339  WBC 15.1* 10.2 11.9*  HGB 11.3* 9.1* 9.5*  HCT 39.4 31.2* 32.7*  MCV 92.1 92.0 92.1  PLT 446* 278 448   Basic Metabolic Panel: Recent Labs  Lab 02/06/20 1819 02/08/20 0430 02/09/20 0339  NA 134* 138 138  K 4.6 4.6 5.1  CL 97* 103 101  CO2 24 28 27  GLUCOSE 200* 152* 165*  BUN 26* 18 21  CREATININE 1.41* 0.98 0.95  CALCIUM 9.2 8.8* 8.7*   GFR: Estimated Creatinine Clearance: 52.4 mL/min (by C-G formula based on SCr of 0.95 mg/dL). Liver Function Tests: No results for input(s): AST, ALT, ALKPHOS, BILITOT, PROT, ALBUMIN in the last 168 hours. No results for input(s): LIPASE, AMYLASE in the last 168 hours. No results for input(s): AMMONIA in the last 168 hours. Coagulation Profile: No results for input(s): INR, PROTIME in the last 168 hours. Cardiac Enzymes: No results for input(s): CKTOTAL, CKMB, CKMBINDEX, TROPONINI in the last 168 hours. BNP (last 3 results) No results for input(s): PROBNP in the last 8760 hours. HbA1C: No results for input(s): HGBA1C in the last 72 hours. CBG: Recent Labs  Lab 02/08/20 1335 02/08/20 1733 02/08/20 2129 02/09/20 0717 02/09/20 1158  GLUCAP 155* 225* 213* 152* 213*   Lipid Profile: No results for input(s):  CHOL, HDL, LDLCALC, TRIG, CHOLHDL, LDLDIRECT in the last 72 hours. Thyroid Function Tests: No results for input(s): TSH, T4TOTAL, FREET4, T3FREE, THYROIDAB in the last 72 hours. Anemia Panel: No results for input(s): VITAMINB12, FOLATE, FERRITIN, TIBC, IRON, RETICCTPCT in the last 72 hours. Urine analysis:    Component Value Date/Time   COLORURINE YELLOW 02/07/2020 0104   APPEARANCEUR CLOUDY (A) 02/07/2020 0104   LABSPEC 1.019 02/07/2020 0104   PHURINE 5.0 02/07/2020 0104   GLUCOSEU NEGATIVE 02/07/2020 0104   HGBUR NEGATIVE 02/07/2020 0104   BILIRUBINUR NEGATIVE 02/07/2020 0104   KETONESUR NEGATIVE 02/07/2020 0104   PROTEINUR NEGATIVE 02/07/2020 0104   NITRITE NEGATIVE 02/07/2020 0104   LEUKOCYTESUR NEGATIVE 02/07/2020 0104   Sepsis Labs: @LABRCNTIP (procalcitonin:4,lacticidven:4)  ) Recent Results (from the past 240 hour(s))  SARS Coronavirus 2 by RT PCR (hospital order, performed in [place] hospital lab) Nasopharyngeal Nasopharyngeal Swab     Status: None   Collection Time: 02/07/20  3:57 PM   Specimen: Nasopharyngeal Swab  Result Value Ref Range Status   SARS Coronavirus 2 NEGATIVE NEGATIVE Final    Comment: (NOTE) SARS-CoV-2 target nucleic acids are NOT DETECTED.  The SARS-CoV-2 RNA is generally detectable in upper and lower respiratory specimens during the acute phase of infection. The lowest concentration of SARS-CoV-2 viral copies this assay can detect is 250 copies / mL. A negative result does not preclude SARS-CoV-2 infection and should not be used as the sole basis for treatment or other patient management decisions.  A negative result may occur with improper specimen collection / handling, submission of specimen other than nasopharyngeal swab, presence of viral mutation(s) within the areas targeted by this assay, and inadequate number of viral copies (<250 copies / mL). A negative result must be combined with clinical observations, patient history, and  epidemiological information.  Fact Sheet for Patients:   StrictlyIdeas.no  Fact Sheet for Healthcare Providers: BankingDealers.co.za  This test is not yet approved or  cleared by the [place] FDA and has been authorized for detection and/or diagnosis of SARS-CoV-2 by FDA under an Emergency Use Authorization (EUA).  This EUA will remain in effect (meaning this test can be used) for the duration of the COVID-19 declaration under Section 564(b)(1) of the Act, 21 U.S.C. section 360bbb-3(b)(1), unless the authorization is terminated or revoked sooner.  Performed at [facility], [place] [address], [place],  [address]       Studies: No results found.  Scheduled Meds: . atorvastatin  10 mg Oral Daily  . diltiazem  180 mg Oral Daily  .  dorzolamide-timolol  1 drop Both Eyes BID  . insulin aspart  0-15 Units Subcutaneous TID WC  . insulin aspart  0-5 Units Subcutaneous QHS  . latanoprost  1 drop Both Eyes QHS  . levothyroxine  75 mcg Oral QAC breakfast  . metoprolol succinate  100 mg Oral Daily  . senna-docusate  2 tablet Oral BID    Continuous Infusions:   LOS: 2 days     Kayleen Memos, MD [facility] Pager [phone]  If 7PM-7AM, please contact night-coverage [email or web address] Password [email or web address] 02/09/2020, 1:12 PM

## 2020-02-09 NOTE — Evaluation (Signed)
Physical Therapy Evaluation Patient Details Name: Crystal Ashley MRN: [id number] DOB: 06/19/1946 Today's Date: 02/09/2020   History of Present Illness  74 y.o. female with fall at home with LLE numbness and syncope, low BP resulting from meds.  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  PMHx:  Afib, DM, CHF  Clinical Impression  Pt was seen for evaluation and worked with PT and OT together on gait and transfers, and to work on fitting brace, as well as Economist.  Pt is tolerating well, positioned to sit up in the chair in comfort with good alignment.  Follow acutely for progression of gait and to increase independence with all transfers, bed mob and to increase LE strength.    Follow Up Recommendations CIR    Equipment Recommendations  Rolling walker with 5" wheels    Recommendations for Other Services       Precautions / Restrictions Precautions Precautions: Back;Fall Precaution Booklet Issued: Yes (comment) Required Braces or Orthoses: Spinal Brace Spinal Brace: Thoracolumbosacral orthotic Restrictions Weight Bearing Restrictions: No Other Position/Activity Restrictions: //      Mobility  Bed Mobility Overal bed mobility: Needs Assistance Bed Mobility: Supine to Sit     Supine to sit: Mod assist;+2 for physical assistance;+2 for safety/equipment        Transfers Overall transfer level: Needs assistance Equipment used: Rolling walker (2 wheeled) Transfers: Sit to/from Stand Sit to Stand: Min assist;+2 physical assistance;+2 safety/equipment            Ambulation/Gait Ambulation/Gait assistance: Min assist;+2 physical assistance;+2 safety/equipment Gait Distance (Feet): 80 Feet (20+60) Assistive device: Rolling walker (2 wheeled);1 person hand held assist;2 person hand held assist Gait Pattern/deviations: Step-through pattern;Decreased stride length;Wide base of support Gait velocity:  reduced Gait velocity interpretation: <1.8 ft/sec, indicate of risk for recurrent falls General Gait Details: pt was able to be assisted to BR then to the hall with close guard on recliner  Stairs            Wheelchair Mobility    Modified Rankin (Stroke Patients Only)       Balance Overall balance assessment: Needs assistance Sitting-balance support: Feet supported;Bilateral upper extremity supported Sitting balance-Leahy Scale: Fair     Standing balance support: Bilateral upper extremity supported;During functional activity Standing balance-Leahy Scale: Poor                               Pertinent Vitals/Pain Pain Assessment: 0-10 Pain Score: 4  Pain Descriptors / Indicators: Aching Pain Intervention(s): Limited activity within patient's tolerance;Premedicated before session;Repositioned    Home Living Family/patient expects to be discharged to:: Private residence Living Arrangements: Children Available Help at Discharge: Family;Available PRN/intermittently Type of Home: House Home Access: Stairs to enter Entrance Stairs-Rails: Can reach both Entrance Stairs-Number of Steps: 3 Home Layout: Two level;Able to live on main level with bedroom/bathroom Home Equipment: Shower seat;Cane - single point;Walker - standard;Walker - 4 wheels;Other (comment) (grab bars in BR)      Prior Function Level of Independence: Independent with assistive device(s)               Hand Dominance   Dominant Hand: Right    Extremity/Trunk Assessment   Upper Extremity Assessment Upper Extremity Assessment: Defer to OT evaluation    Lower Extremity Assessment Lower Extremity Assessment: Generalized weakness    Cervical / Trunk Assessment Cervical / Trunk Assessment:  Other exceptions (lumbar and thoracic fractures)  Communication   Communication: No difficulties  Cognition Arousal/Alertness: Awake/alert Behavior During Therapy: WFL for tasks  assessed/performed Overall Cognitive Status: Within Functional Limits for tasks assessed                                        General Comments General comments (skin integrity, edema, etc.): Pt was assisted to fit TLSO and assisted to sit in chair with support of pillows for upright posture    Exercises     Assessment/Plan    PT Assessment Patient needs continued PT services  PT Problem List Decreased strength;Decreased range of motion;Decreased activity tolerance;Decreased balance;Decreased mobility;Decreased coordination;Decreased knowledge of use of DME;Decreased safety awareness;Pain       PT Treatment Interventions DME instruction;Gait training;Stair training;Functional mobility training;Therapeutic activities;Therapeutic exercise;Balance training;Neuromuscular re-education;Patient/family education    PT Goals (Current goals can be found in the Care Plan section)  Acute Rehab PT Goals Patient Stated Goal: to get better PT Goal Formulation: With patient/family Time For Goal Achievement: 02/23/20 Potential to Achieve Goals: Good    Frequency Min 5X/week   Barriers to discharge Inaccessible home environment struggling to walk and stand alone    Co-evaluation               AM-PAC PT "6 Clicks" Mobility  Outcome Measure Help needed turning from your back to your side while in a flat bed without using bedrails?: A Little Help needed moving from lying on your back to sitting on the side of a flat bed without using bedrails?: A Lot Help needed moving to and from a bed to a chair (including a wheelchair)?: A Lot Help needed standing up from a chair using your arms (e.g., wheelchair or bedside chair)?: A Lot Help needed to walk in hospital room?: A Little Help needed climbing 3-5 steps with a railing? : A Lot 6 Click Score: 14    End of Session Equipment Utilized During Treatment: Gait belt Activity Tolerance: Patient tolerated treatment well Patient  left: in chair;with chair alarm set;with call bell/phone within reach;with family/visitor present Nurse Communication: Mobility status PT Visit Diagnosis: Unsteadiness on feet (R26.81);Muscle weakness (generalized) (M62.81);Pain Pain - Right/Left:  (back) Pain - part of body:  (back)    Time: 0459-9774 PT Time Calculation (min) (ACUTE ONLY): 42 min   Charges:   PT Evaluation $PT Eval Moderate Complexity: 1 Mod PT Treatments $Gait Training: 8-22 mins       Ramond Dial 02/09/2020, 11:05 PM  Mee Hives, PT MS Acute Rehab Dept. Number: [place]

## 2020-02-09 NOTE — Evaluation (Signed)
Occupational Therapy Evaluation Patient Details Name: Crystal Ashley MRN: [id number] DOB: 16-Jun-1946 Today's Date: 02/09/2020    History of Present Illness Patient is a 74 y.o. female with Afib, DM, CHF had a fall after an episode of hypotension with resulting back and hip pain.  She was found to have pubic rami fx as well as a T10 fracture, possible L1 fracture.  No weakness, numbness, tingling in her LEs or bowel/bladder changes.   Clinical Impression    Pt. Was cooperative with therapy and is motivated to work with OT to maximize function and I. Pt. Was given hand out on back precautions and was ed. Pt. Will need further ed and training. Pt. May benefit from CIR for rehab. Pt. Does not have 24/7 support. Will continue to work with pt on acute care until she leaves.     Follow Up Recommendations  CIR    Equipment Recommendations  None recommended by OT    Recommendations for Other Services Rehab consult     Precautions / Restrictions Precautions Precautions: Back Precaution Booklet Issued:  (given back precautions handout. ) Required Braces or Orthoses: Spinal Brace      Mobility Bed Mobility Overal bed mobility: Needs Assistance Bed Mobility: Supine to Sit     Supine to sit: Mod assist;+2 for physical assistance        Transfers Overall transfer level: Needs assistance Equipment used: Rolling walker (2 wheeled) Transfers: Sit to/from Stand Sit to Stand: Min assist              Balance                                           ADL either performed or assessed with clinical judgement   ADL Overall ADL's : Needs assistance/impaired Eating/Feeding: Modified independent   Grooming: Wash/dry hands;Wash/dry face;Set up;Supervision/safety   Upper Body Bathing: Minimal assistance;Sitting   Lower Body Bathing: Maximal assistance;Sit to/from stand   Upper Body Dressing : Minimal assistance;Sitting   Lower Body Dressing: Total  assistance;Sit to/from stand   Toilet Transfer: Minimal assistance;Comfort height toilet   Toileting- Clothing Manipulation and Hygiene: Maximal assistance       Functional mobility during ADLs: Minimal assistance General ADL Comments: Pt. given handout for back precautions and discussed with family and will need further ed     Vision   Vision Assessment?: No apparent visual deficits     Perception     Praxis      Pertinent Vitals/Pain Pain Assessment: 0-10 Pain Score: 4  Pain Location:  (back pelvis) Pain Descriptors / Indicators: Aching Pain Intervention(s): Premedicated before session     Hand Dominance Right   Extremity/Trunk Assessment Upper Extremity Assessment Upper Extremity Assessment: Generalized weakness           Communication Communication Communication: No difficulties   Cognition Arousal/Alertness: Awake/alert Behavior During Therapy: WFL for tasks assessed/performed Overall Cognitive Status: Within Functional Limits for tasks assessed                                     General Comments       Exercises     Shoulder Instructions      Home Living Family/patient expects to be discharged to:: Private residence Living Arrangements: Children Available Help at Discharge: Family;Available PRN/intermittently (son  and dtr in law work) Type of Home: House Home Access: Stairs to enter [employer] of Steps: 3 Entrance Stairs-Rails: Can reach both [place]: Two level;Able to live on main level with bedroom/bathroom     Bathroom Shower/Tub: Occupational psychologist: Handicapped height     Home Equipment: Shower seat;Cane - single point;Walker - standard (toilet rails, rollator)          Prior Functioning/Environment Level of Independence: Independent with assistive device(s) (for self care. Iadls were performed)                 OT Problem List: Decreased activity tolerance;Decreased safety  awareness;Decreased knowledge of use of DME or AE;Decreased knowledge of precautions      OT Treatment/Interventions: Self-care/ADL training;DME and/or AE instruction;Therapeutic activities;Patient/family education    OT Goals(Current goals can be found in the care plan section) Acute Rehab OT Goals Patient Stated Goal: to get better OT Goal Formulation: With patient Time For Goal Achievement: 02/23/20 Potential to Achieve Goals: Good  OT Frequency: Min 2X/week   Barriers to D/C: Decreased caregiver support  pt son works and his girlfriend is in school       Co-evaluation              AM-PAC OT "6 Clicks" Daily Activity     Outcome Measure Help from another person eating meals?: None Help from another person taking care of personal grooming?: A Little Help from another person toileting, which includes using toliet, bedpan, or urinal?: A Lot Help from another person bathing (including washing, rinsing, drying)?: A Lot Help from another person to put on and taking off regular upper body clothing?: A Little Help from another person to put on and taking off regular lower body clothing?: Total 6 Click Score: 15   End of Session Equipment Utilized During Treatment: Gait belt;Rolling walker;Back brace Nurse Communication:  (ok therapy)  Activity Tolerance: Patient tolerated treatment well Patient left: in chair;with call bell/phone within reach;with chair alarm set;with family/visitor present  OT Visit Diagnosis: Unsteadiness on feet (R26.81)                Time: 7628-3151 OT Time Calculation (min): 40 min Charges:  OT General Charges $OT Visit: 1 Visit OT Evaluation $OT Eval Moderate Complexity: 1 Mod OT Treatments $Self Care/Home Management : 8-22 mins  [name] OT/L [name] 02/09/2020, 12:13 PM

## 2020-02-09 NOTE — Plan of Care (Signed)

## 2020-02-09 NOTE — Progress Notes (Signed)
Physical Therapy Treatment Patient Details Name: Crystal Ashley MRN: [id number] DOB: November 12, 1945 Today's Date: 02/09/2020    History of Present Illness 74 y.o. female with fall at home with LLE numbness and syncope, low BP resulting from meds.  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  PMHx:  Afib, DM, CHF    PT Comments    Pt was seen for mobility with there ex, declined to get OOB due to being uncomfortable.  Pt in bed with TLSO on, discussed that it is not meant to be worn in bed but pt insists.  Encouraged her to remove it to rest as she will not be comfortable.  Follow acutely for progression of safe movement and instruction of precautions.  Pt is motivated to work and get home as soon as possible.  Discussion about managing pain and not overdoing such that it could slow down progress.    Follow Up Recommendations  CIR     Equipment Recommendations  Rolling walker with 5" wheels    Recommendations for Other Services       Precautions / Restrictions Precautions Precautions: Back;Fall Precaution Booklet Issued: Yes (comment) Required Braces or Orthoses: Spinal Brace Spinal Brace: Thoracolumbosacral orthotic Restrictions Weight Bearing Restrictions: No    Mobility  Bed Mobility Overal bed mobility: Needs Assistance             General bed mobility comments: minimal repositioning on bed  Transfers Overall transfer level: Needs assistance                  Ambulation/Gait   Gait Distance (Feet):  (20+60)             Stairs             Wheelchair Mobility    Modified Rankin (Stroke Patients Only)       Balance Overall balance assessment: Needs assistance Sitting-balance support: Feet supported;Bilateral upper extremity supported Sitting balance-Leahy Scale: Fair       Standing balance-Leahy Scale: Poor                              Cognition Arousal/Alertness:  Awake/alert Behavior During Therapy: WFL for tasks assessed/performed Overall Cognitive Status: Within Functional Limits for tasks assessed                                        Exercises General Exercises - Lower Extremity Ankle Circles/Pumps: AROM;5 reps Quad Sets: AROM;10 reps Gluteal Sets: AROM;10 reps Heel Slides: AAROM;10 reps Hip ABduction/ADduction: AAROM;10 reps Straight Leg Raises: AAROM;10 reps Hip Flexion/Marching: AAROM;10 reps    General Comments General comments (skin integrity, edema, etc.): Pt was assisted to fit TLSO and assisted to sit in chair with support of pillows for upright posture      Pertinent Vitals/Pain Pain Assessment: 0-10 Pain Score: 4  Pain Descriptors / Indicators: Aching Pain Intervention(s): Limited activity within patient's tolerance;Premedicated before session;Repositioned    Home Living Family/patient expects to be discharged to:: Private residence Living Arrangements: Children Available Help at Discharge: Family;Available PRN/intermittently Type of Home: House Home Access: Stairs to enter Entrance Stairs-Rails: Can reach both Home Layout: Two level;Able to live on main level with bedroom/bathroom Home Equipment: Shower seat;Cane - single point;Walker - standard;Walker - 4 wheels;Other (comment) (grab bars in BR)  Prior Function Level of Independence: Independent with assistive device(s)          PT Goals (current goals can now be found in the care plan section) Acute Rehab PT Goals Patient Stated Goal: to get better PT Goal Formulation: With patient/family Time For Goal Achievement: 02/23/20 Potential to Achieve Goals: Good    Frequency    Min 5X/week      PT Plan      Co-evaluation              AM-PAC PT "6 Clicks" Mobility   Outcome Measure  Help needed turning from your back to your side while in a flat bed without using bedrails?: A Little Help needed moving from lying on your back  to sitting on the side of a flat bed without using bedrails?: A Lot Help needed moving to and from a bed to a chair (including a wheelchair)?: A Lot Help needed standing up from a chair using your arms (e.g., wheelchair or bedside chair)?: A Lot Help needed to walk in hospital room?: A Little Help needed climbing 3-5 steps with a railing? : A Lot 6 Click Score: 14    End of Session   Activity Tolerance: Patient tolerated treatment well Patient left: with call bell/phone within reach;with family/visitor present;in bed;with bed alarm set Nurse Communication: Mobility status PT Visit Diagnosis: Unsteadiness on feet (R26.81);Muscle weakness (generalized) (M62.81);Pain Pain - Right/Left:  (back) Pain - part of body:  (back)     Time: 7517-0017 PT Time Calculation (min) (ACUTE ONLY): 18 min  Charges:  $Therapeutic Exercise: 8-22 mins                      Ramond Dial 02/09/2020, 11:16 PM  Mee Hives, PT MS Acute Rehab Dept. Number: [place] [facility] [place]

## 2020-02-10 LAB — CBC
HCT: 34.8 % — ABNORMAL LOW (ref 36.0–46.0)
Hemoglobin: 10 g/dL — ABNORMAL LOW (ref 12.0–15.0)
MCH: 27.1 pg (ref 26.0–34.0)
MCHC: 28.7 g/dL — ABNORMAL LOW (ref 30.0–36.0)
MCV: 94.3 fL (ref 80.0–100.0)
Platelets: 264 10*3/uL (ref 150–400)
RBC: 3.69 MIL/uL — ABNORMAL LOW (ref 3.87–5.11)
RDW: 17.2 % — ABNORMAL HIGH (ref 11.5–15.5)
WBC: 10.4 10*3/uL (ref 4.0–10.5)
nRBC: 0.2 % (ref 0.0–0.2)

## 2020-02-10 LAB — BASIC METABOLIC PANEL
Anion gap: 10 (ref 5–15)
BUN: 19 mg/dL (ref 8–23)
CO2: 26 mmol/L (ref 22–32)
Calcium: 8.4 mg/dL — ABNORMAL LOW (ref 8.9–10.3)
Chloride: 101 mmol/L (ref 98–111)
Creatinine, Ser: 0.87 mg/dL (ref 0.44–1.00)
GFR calc Af Amer: >60 mL/min (ref >60)
GFR calc non Af Amer: >60 mL/min (ref >60)
Glucose, Bld: 161 mg/dL — ABNORMAL HIGH (ref 70–99)
Potassium: 4.7 mmol/L (ref 3.5–5.1)
Sodium: 137 mmol/L (ref 135–145)

## 2020-02-10 LAB — GLUCOSE, CAPILLARY
Glucose-Capillary: 144 mg/dL — ABNORMAL HIGH (ref 70–99)
Glucose-Capillary: 219 mg/dL — ABNORMAL HIGH (ref 70–99)
Glucose-Capillary: 135 mg/dL — ABNORMAL HIGH (ref 70–99)
Glucose-Capillary: 303 mg/dL — ABNORMAL HIGH (ref 70–99)

## 2020-02-10 NOTE — Progress Notes (Signed)
Inpatient Rehab Admissions Coordinator:   Met with patient at bedside to discuss potential CIR admission. Pt. Stated interest. Will call Pt.'s son to confirm support and  pursue for potential admit next week, pending bed availability.    Clemens Catholic, [place], [place] Admissions Coordinator  [phone] ([place]) [phone] (office)

## 2020-02-10 NOTE — Progress Notes (Signed)
PROGRESS NOTE  Crystal Ashley YCX:[id number] DOB: 05/10/1946 DOA: 02/06/2020 PCP: [name], PA  HPI/Recap of past 24 hours: Crystal Ashley is a 74 y.o. female with medical history significant of hypothyroidism; HTN; afib on Xarelto; chronic combined CHF; and glaucoma presenting with back pain and hypotension.  She reports falling on 01/31/20 and having ongoing back and R hip pain.  She has had increased difficulty walking even with her walker.  She is feeling better at this time.  She is having normal urination and BMs.  No numbness/weakness/tingling of her legs. Hospitalist team asked to admit.  Seen by Ortho Dr. Marlou Sa.  Also seen by neurosurgery, Dr. Marcello Moores.  CT TL spine revealed T10 and T11 vertebral fracture as well as ankylosis finding L1-2 and L5-S1.  Neurosurgery recommended thoracic and lumbar MRI for further evaluation as well as TLSO brace when out of bed.  No evidence of disruption of the posterior tension band on MRI thoracic and lumbar spine per neurosurgery.  She will need to follow-up in neurosurgery clinic in 4 weeks for an upright x-ray.  02/10/20: Seen and examined at her bedside this morning.  No acute events overnight.  She has no new complaints.  States her lower back pain is improved 5 out of 10 without pain medication.  Evaluated by CIR, awaiting bed placement.   Assessment/Plan: Principal Problem:   Closed fracture of multiple pubic rami, right, initial encounter ([place]) Active Problems:   Hypertension associated with diabetes ([place])   Hypothyroidism   Hyperlipidemia associated with type 2 diabetes mellitus ([place])   Type 2 diabetes mellitus without complication, without long-term current use of insulin ([place])   Morbid obesity with BMI of 40.0-44.9, adult ([facility])   Chronic combined systolic (congestive) and diastolic (congestive) heart failure ([facility])   Atrial fibrillation, chronic ([place])  Post fall 01/31/20/Acute T10 T11 vertebral fractures/Diffuse ankylosis of the  spine -Patient with a fall on 6/16; she was seen in the ER with xrays of thoracic and lumbar spine which were unrevealing. -Her cousin is a physician in [place], recommends CT of thoracic and lumbar spine -CT scan done on 02/07/2020 showed T10-T11 vertebral fractures and lumbar spondylosis with moderate spinal stenosis at L4-L5, chronic mild L1 compression fracture on CT scan. -Seen by orthopedic surgery and neurosurgery -MRI thoracic and lumbar ordered  No evidence of disruption of the posterior tension band -Conservative management recommended with TLSO brace when out of bed x3 months -She will need to follow-up in neurosurgery clinic in 4 weeks for an upright x-ray. -Pain control in place, continue bowel regimen -OT recommend CIR -CIR evaluated, awaiting bed placement.  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 Renal function is back to baseline. She is on p.o. Lasix 40 mg daily at home, hold. Valsartan also on hold due to AKI. Renal ultrasound shows bilateral renal cortical atrophy, small amount of nonspecific right perinephric fluid.  No evidence of renal mass or hydronephrosis. UA Negative for proteinuria and pyuria. Continue to avoid nephrotoxins and hypotension Completed gentle IV fluid hydration normal saline at 50 cc/h x 12 hours. Off IV fluid, encourage oral intake Repeat BMP in the morning  Acute blood loss anemia in the setting of acute fractures Hemoglobin from 11 to 9 K to 9.5 K Xarelto on hold Continue to monitor H&H Repeat CBC in the morning  Permanent Afib Rate controlled  on Toprol and Cardizem On Xarelto for CVA prevention, held in the setting of acute fractures as recommended by orthopedic surgery. We will touch base with orthopedic surgery regarding when to restart Xarelto.  Essential hypertension  Initially hypotensive Blood  pressure is currently stable She is on diltiazem and Toprol-XL, continue Valsartan and Lasix on hold due to AKI. Continue to monitor vital signs  Chronic diastolic CHF -08/22/08 echo with preserved EF and indeterminate diastolic function -Appears compensated at this time -BNP 171 on admission from 397 in March -Euvolemic on exam -Lasix on hold, received gentle IV fluid hydration for 12 hours on 02/08/2020. -Continue to closely monitor volume status while on IV fluids -Continue strict I's and O's and daily weight -Continue cardiac medications as indicated above.  Resolved leukocytosis, likely reactive in the setting of acute fractures UA negative, chest x-ray unremarkable. Leukocytosis has resolved Afebrile with no evidence of active infective process.  HLD -Continue Lipitor  Type 2 diabetes with hyperglycemia -A1c was 6.8 on 11/29/19 -Continue to hold off oral hypoglycemics -Continue moderate insulin sliding scale  Morbid obesity -Weight loss should be encouraged outpatient  Hypothyroidism -Recent normal TSH -Continue Synthroid at home dose.  Glaucoma -Continue [place], [place]  Ambulatory dysfunction  PT OT to assess, follow orthopedic surgery and neurosurgery's recommendations Continue fall precautions    DVT prophylaxis:  SCDs, Xarelto on hold, she takes for permanent A. fib. Code Status:  DNR - confirmed with patient Family Communication: None present at bedside.    Consults called: Orthopedics; Nephrology; neurosurgery.    Status is: Inpatient    Dispo: The patient is from: Home.              Anticipated d/c is to: Home with home health services.               Anticipated d/c date is: 02/11/2020.               Patient currently appropriate for discharge.  Medically cleared.  Awaiting CIR placement.       Objective: Vitals:   02/10/20 0340 02/10/20 0500 02/10/20 0856 02/10/20 1000  BP: (!) 143/61  (!) 144/70 (!) 125/53  Pulse: 62  (!) 55 (!)  54  Resp: 17  17   Temp: (!) 97.4 F (36.3 C)  98.3 F (36.8 C)   TempSrc: Oral  Oral   SpO2: 100%  96%   Weight:  92.2 kg    Height:        Intake/Output Summary (Last 24 hours) at 02/10/2020 1230 Last data filed at 02/10/2020 0600 Gross per 24 hour  Intake 240 ml  Output --  Net 240 ml   Filed Weights   02/07/20 2200 02/09/20 0600 02/10/20 0500  Weight: 83.8 kg 91.6 kg 92.2 kg    Exam: No significant changes from previous exam.  . General: 74 y.o. year-old female pleasant, obese in no acute distress.  Alert and oriented x3.  . Cardiovascular: Irregular rate and rhythm no rubs or gallops.   Marland Kitchen Respiratory: Clear to auscultation no wheezes or rales.   . Abdomen: Soft nontender normal bowel sounds present.   . Musculoskeletal: No lower extremity edema bilaterally. Marland Kitchen Psychiatry: Mood is appropriate for condition and setting.  Data Reviewed: CBC: Recent Labs  Lab 02/06/20 1819 02/08/20 0430 02/09/20 0339 02/10/20 0740  WBC 15.1* 10.2 11.9* 10.4  HGB 11.3* 9.1* 9.5* 10.0*  HCT 39.4 31.2* 32.7* 34.8*  MCV 92.1 92.0 92.1 94.3  PLT 446* 278 324  213   Basic Metabolic Panel: Recent Labs  Lab 02/06/20 1819 02/08/20 0430 02/09/20 0339 02/10/20 0351  NA 134* 138 138 137  K 4.6 4.6 5.1 4.7  CL 97* 103 101 101  CO2 24 28 27 26   GLUCOSE 200* 152* 165* 161*  BUN 26* 18 21 19   CREATININE 1.41* 0.98 0.95 0.87  CALCIUM 9.2 8.8* 8.7* 8.4*   GFR: Estimated Creatinine Clearance: 57.5 mL/min (by C-G formula based on SCr of 0.87 mg/dL). Liver Function Tests: No results for input(s): AST, ALT, ALKPHOS, BILITOT, PROT, ALBUMIN in the last 168 hours. No results for input(s): LIPASE, AMYLASE in the last 168 hours. No results for input(s): AMMONIA in the last 168 hours. Coagulation Profile: No results for input(s): INR, PROTIME in the last 168 hours. Cardiac Enzymes: No results for input(s): CKTOTAL, CKMB, CKMBINDEX, TROPONINI in the last 168 hours. BNP (last 3 results) No  results for input(s): PROBNP in the last 8760 hours. HbA1C: No results for input(s): HGBA1C in the last 72 hours. CBG: Recent Labs  Lab 02/09/20 1158 02/09/20 1724 02/09/20 1951 02/10/20 0754 02/10/20 1110  GLUCAP 213* 217* 218* 144* 219*   Lipid Profile: No results for input(s): CHOL, HDL, LDLCALC, TRIG, CHOLHDL, LDLDIRECT in the last 72 hours. Thyroid Function Tests: No results for input(s): TSH, T4TOTAL, FREET4, T3FREE, THYROIDAB in the last 72 hours. Anemia Panel: No results for input(s): VITAMINB12, FOLATE, FERRITIN, TIBC, IRON, RETICCTPCT in the last 72 hours. Urine analysis:    Component Value Date/Time   COLORURINE YELLOW 02/07/2020 0104   APPEARANCEUR CLOUDY (A) 02/07/2020 0104   LABSPEC 1.019 02/07/2020 0104   PHURINE 5.0 02/07/2020 0104   GLUCOSEU NEGATIVE 02/07/2020 0104   HGBUR NEGATIVE 02/07/2020 0104   BILIRUBINUR NEGATIVE 02/07/2020 0104   KETONESUR NEGATIVE 02/07/2020 0104   PROTEINUR NEGATIVE 02/07/2020 0104   NITRITE NEGATIVE 02/07/2020 0104   LEUKOCYTESUR NEGATIVE 02/07/2020 0104   Sepsis Labs: @LABRCNTIP (procalcitonin:4,lacticidven:4)  ) Recent Results (from the past 240 hour(s))  SARS Coronavirus 2 by RT PCR (hospital order, performed in [facility] hospital lab) Nasopharyngeal Nasopharyngeal Swab     Status: None   Collection Time: 02/07/20  3:57 PM   Specimen: Nasopharyngeal Swab  Result Value Ref Range Status   SARS Coronavirus 2 NEGATIVE NEGATIVE Final    Comment: (NOTE) SARS-CoV-2 target nucleic acids are NOT DETECTED.  The SARS-CoV-2 RNA is generally detectable in upper and lower respiratory specimens during the acute phase of infection. The lowest concentration of SARS-CoV-2 viral copies this assay can detect is 250 copies / mL. A negative result does not preclude SARS-CoV-2 infection and should not be used as the sole basis for treatment or other patient management decisions.  A negative result may occur with improper specimen  collection / handling, submission of specimen other than nasopharyngeal swab, presence of viral mutation(s) within the areas targeted by this assay, and inadequate number of viral copies (<250 copies / mL). A negative result must be combined with clinical observations, patient history, and epidemiological information.  Fact Sheet for Patients:   StrictlyIdeas.no  Fact Sheet for Healthcare Providers: BankingDealers.co.za  This test is not yet approved or  cleared by the [place] FDA and has been authorized for detection and/or diagnosis of SARS-CoV-2 by FDA under an Emergency Use Authorization (EUA).  This EUA will remain in effect (meaning this test can be used) for the duration of the COVID-19 declaration under Section 564(b)(1) of the Act, 21 U.S.C. section 360bbb-3(b)(1), unless the authorization is terminated  or revoked sooner.  Performed at [facility], [place] [address] [place], [place] [address]       Studies: No results found.  Scheduled Meds: . atorvastatin  10 mg Oral Daily  . diltiazem  180 mg Oral Daily  . dorzolamide-timolol  1 drop Both Eyes BID  . insulin aspart  0-15 Units Subcutaneous TID WC  . insulin aspart  0-5 Units Subcutaneous QHS  . latanoprost  1 drop Both Eyes QHS  . levothyroxine  75 mcg Oral QAC breakfast  . metoprolol succinate  100 mg Oral Daily  . senna-docusate  2 tablet Oral BID    Continuous Infusions:   LOS: 3 days     Kayleen Memos, MD [facility] Pager [phone]  If 7PM-7AM, please contact night-coverage [email or web address] Password [facility] 02/10/2020, 12:30 PM

## 2020-02-11 LAB — CBC
HCT: 31.8 % — ABNORMAL LOW (ref 36.0–46.0)
Hemoglobin: 9.2 g/dL — ABNORMAL LOW (ref 12.0–15.0)
MCH: 26.6 pg (ref 26.0–34.0)
MCHC: 28.9 g/dL — ABNORMAL LOW (ref 30.0–36.0)
MCV: 91.9 fL (ref 80.0–100.0)
Platelets: 300 10*3/uL (ref 150–400)
RBC: 3.46 MIL/uL — ABNORMAL LOW (ref 3.87–5.11)
RDW: 17.2 % — ABNORMAL HIGH (ref 11.5–15.5)
WBC: 10.5 10*3/uL (ref 4.0–10.5)
nRBC: 0 % (ref 0.0–0.2)

## 2020-02-11 LAB — GLUCOSE, CAPILLARY
Glucose-Capillary: 150 mg/dL — ABNORMAL HIGH (ref 70–99)
Glucose-Capillary: 230 mg/dL — ABNORMAL HIGH (ref 70–99)
Glucose-Capillary: 195 mg/dL — ABNORMAL HIGH (ref 70–99)
Glucose-Capillary: 215 mg/dL — ABNORMAL HIGH (ref 70–99)

## 2020-02-11 LAB — BASIC METABOLIC PANEL
Anion gap: 7 (ref 5–15)
BUN: 20 mg/dL (ref 8–23)
CO2: 29 mmol/L (ref 22–32)
Calcium: 8.6 mg/dL — ABNORMAL LOW (ref 8.9–10.3)
Chloride: 102 mmol/L (ref 98–111)
Creatinine, Ser: 0.92 mg/dL (ref 0.44–1.00)
GFR calc Af Amer: >60 mL/min (ref >60)
GFR calc non Af Amer: >60 mL/min (ref >60)
Glucose, Bld: 136 mg/dL — ABNORMAL HIGH (ref 70–99)
Potassium: 4.9 mmol/L (ref 3.5–5.1)
Sodium: 138 mmol/L (ref 135–145)

## 2020-02-11 MED ORDER — FUROSEMIDE 40 MG PO TABS
40.0000 mg | ORAL_TABLET | Freq: Every day | ORAL | Status: DC
  Administered 2020-02-11 – 2020-02-12 (×2): 40 mg via ORAL
  Filled 2020-02-11 (×2): qty 1

## 2020-02-11 NOTE — Progress Notes (Signed)
PROGRESS NOTE  Crystal Ashley ERD:[id number] DOB: 03-Sep-1945 DOA: 02/06/2020 PCP: [name], PA  HPI/Recap of past 24 hours: Crystal Ashley is a 74 y.o. female with medical history significant of hypothyroidism; HTN; afib on Xarelto; chronic combined CHF; and glaucoma presenting with back pain and hypotension.  She reports falling on 01/31/20 and having ongoing back and R hip pain.  She has had increased difficulty walking even with her walker.  She is feeling better at this time.  She is having normal urination and BMs.  No numbness/weakness/tingling of her legs. Hospitalist team asked to admit.  Seen by Ortho Dr. Marlou Sa.  Also seen by neurosurgery, Dr. Marcello Moores.  CT TL spine revealed T10 and T11 vertebral fracture as well as ankylosis finding L1-2 and L5-S1.  Neurosurgery recommended thoracic and lumbar MRI for further evaluation as well as TLSO brace when out of bed.  No evidence of disruption of the posterior tension band on MRI thoracic and lumbar spine per neurosurgery.  She will need to follow-up in neurosurgery clinic in 4 weeks for an upright x-ray.  Evaluated by CIR, awaiting bed placement.  02/11/20: Seen and examined at her bedside.  No acute events overnight.  She appears well and has no new complaints this morning.  Pain is well controlled.  Awaiting bed placement at CIR.   Assessment/Plan: Principal Problem:   Closed fracture of multiple pubic rami, right, initial encounter ([place]) Active Problems:   Hypertension associated with diabetes ([place])   Hypothyroidism   Hyperlipidemia associated with type 2 diabetes mellitus ([place])   Type 2 diabetes mellitus without complication, without long-term current use of insulin ([place])   Morbid obesity with BMI of 40.0-44.9, adult (H[facility])   Chronic combined systolic (congestive) and diastolic (congestive) heart failure (H[facility])   Atrial fibrillation, chronic ([place])  Post fall 01/31/20/Acute T10 T11 vertebral fractures/Diffuse ankylosis of the  spine -Patient with a fall on 6/16; she was seen in the ER with xrays of thoracic and lumbar spine which were unrevealing. -Her cousin is a physician in [place], recommends CT of thoracic and lumbar spine -CT scan done on 02/07/2020 showed T10-T11 vertebral fractures and lumbar spondylosis with moderate spinal stenosis at L4-L5, chronic mild L1 compression fracture on CT scan. -Seen by orthopedic surgery and neurosurgery -MRI thoracic and lumbar ordered  No evidence of disruption of the posterior tension band -Conservative management recommended with TLSO brace when out of bed x3 months -She will need to follow-up in neurosurgery clinic in 4 weeks for an upright x-ray. -Pain control in place, continue bowel regimen -OT recommend CIR -CIR evaluated, awaiting bed placement. -Continue out of bed to chair as tolerated -Ambulate with every shift as tolerated using TLSO when out of bed. -Continue fall precautions.  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 Renal function is back to baseline. She is on p.o. Lasix 40 mg daily at home, resume. Valsartan, continue to hold due to AKI, no proteinuria on UA done on 02/07/2020, not hypertensive, last LVEF 55 to 60%.  Defer to cardiology to restart. Renal ultrasound shows bilateral renal cortical atrophy, small amount of nonspecific right perinephric fluid.  No evidence of renal mass or hydronephrosis. UA Negative for proteinuria and pyuria. Continue to avoid nephrotoxins and hypotension  Acute blood loss anemia in the setting of acute fractures Hemoglobin from 11 to 9.2. Xarelto on hold  Continue to monitor H&H Repeat CBC in the morning  Permanent Afib Rate controlled on Toprol and Cardizem On Xarelto for CVA prevention, held in the setting of acute fractures as recommended by orthopedic surgery. We will touch base with  orthopedic surgery regarding when to restart Xarelto.  Essential hypertension  Initially hypotensive Blood pressure is currently stable She is on diltiazem and Toprol-XL, continue P.o. Lasix restarted on 02/11/2020 Valsartan on hold Continue to monitor vital signs  Chronic diastolic CHF -12/22/82 echo with preserved EF and indeterminate diastolic function -Appears compensated at this time -BNP 171 on admission from 397 in March -Euvolemic on exam -Home p.o. diuretics restarted -Continue strict I's and O's and daily weight -Continue cardiac medications as indicated above.  Resolved leukocytosis, likely reactive in the setting of acute fractures UA negative, chest x-ray unremarkable. Leukocytosis has resolved Afebrile with no evidence of active infective process.  HLD -Continue Lipitor  Type 2 diabetes with hyperglycemia -A1c was 6.8 on 11/29/19 -Continue to hold off oral hypoglycemics -Continue moderate insulin sliding scale  Morbid obesity -Weight loss should be encouraged outpatient  Hypothyroidism -Recent normal TSH -Continue Synthroid at home dose.  Glaucoma -Continue [place], [place]  Ambulatory dysfunction  PT OT to assess, follow orthopedic surgery and neurosurgery's recommendations Out of bed to chair with every shift Ambulate as tolerated with every shift Continue fall precautions    DVT prophylaxis:  SCDs, Xarelto on hold, she takes for permanent A. fib. Code Status:  DNR - confirmed with patient Family Communication: None present at bedside.    Consults called: Orthopedics; Nephrology; neurosurgery.    Status is: Inpatient    Dispo: The patient is from: Home.              Anticipated d/c is to: Inpatient rehab.              Anticipated d/c date is: 02/12/2020.               Patient currently appropriate for discharge.  Medically cleared.  Awaiting CIR placement.       Objective: Vitals:   02/10/20 1358 02/10/20 2035 02/11/20 0425  02/11/20 0843  BP: (!) 119/58 134/83 129/76 (!) 136/54  Pulse: 61 68 60 70  Resp: 17 18 20 18   Temp: 98.2 F (36.8 C) 98.1 F (36.7 C) 98.3 F (36.8 C) 98 F (36.7 C)  TempSrc: Oral Oral  Oral  SpO2: 98% 97% 99% 99%  Weight:      Height:        Intake/Output Summary (Last 24 hours) at 02/11/2020 1254 Last data filed at 02/11/2020 0425 Gross per 24 hour  Intake --  Output 601 ml  Net -601 ml   Filed Weights   02/07/20 2200 02/09/20 0600 02/10/20 0500  Weight: 83.8 kg 91.6 kg 92.2 kg    Exam: No significant changes from previous exam.  . General: 74 y.o. year-old female pleasant in no acute distress.  Alert oriented x3. . Cardiovascular: Irregular rate and rhythm no rubs or gallops.   Marland Kitchen Respiratory: Clear to auscultation no wheezes or rales.   . Abdomen: Soft nontender normal bowel sounds present. . Musculoskeletal: Trace lower extremity edema bilaterally.   Marland Kitchen Psychiatry: Mood is appropriate for condition and setting.  Data Reviewed: CBC: Recent Labs  Lab 02/06/20 1819 02/08/20 0430 02/09/20 0339 02/10/20 0740 02/11/20 0428  WBC 15.1* 10.2 11.9* 10.4 10.5  HGB 11.3* 9.1* 9.5* 10.0* 9.2*  HCT 39.4 31.2* 32.7* 34.8* 31.8*  MCV 92.1  92.0 92.1 94.3 91.9  PLT 446* 278 324 264 607   Basic Metabolic Panel: Recent Labs  Lab 02/06/20 1819 02/08/20 0430 02/09/20 0339 02/10/20 0351 02/11/20 0428  NA 134* 138 138 137 138  K 4.6 4.6 5.1 4.7 4.9  CL 97* 103 101 101 102  CO2 24 28 27 26 29   GLUCOSE 200* 152* 165* 161* 136*  BUN 26* 18 21 19 20   CREATININE 1.41* 0.98 0.95 0.87 0.92  CALCIUM 9.2 8.8* 8.7* 8.4* 8.6*   GFR: Estimated Creatinine Clearance: 54.4 mL/min (by C-G formula based on SCr of 0.92 mg/dL). Liver Function Tests: No results for input(s): AST, ALT, ALKPHOS, BILITOT, PROT, ALBUMIN in the last 168 hours. No results for input(s): LIPASE, AMYLASE in the last 168 hours. No results for input(s): AMMONIA in the last 168 hours. Coagulation Profile: No  results for input(s): INR, PROTIME in the last 168 hours. Cardiac Enzymes: No results for input(s): CKTOTAL, CKMB, CKMBINDEX, TROPONINI in the last 168 hours. BNP (last 3 results) No results for input(s): PROBNP in the last 8760 hours. HbA1C: No results for input(s): HGBA1C in the last 72 hours. CBG: Recent Labs  Lab 02/10/20 1110 02/10/20 1618 02/10/20 2032 02/11/20 0840 02/11/20 1240  GLUCAP 219* 135* 303* 150* 230*   Lipid Profile: No results for input(s): CHOL, HDL, LDLCALC, TRIG, CHOLHDL, LDLDIRECT in the last 72 hours. Thyroid Function Tests: No results for input(s): TSH, T4TOTAL, FREET4, T3FREE, THYROIDAB in the last 72 hours. Anemia Panel: No results for input(s): VITAMINB12, FOLATE, FERRITIN, TIBC, IRON, RETICCTPCT in the last 72 hours. Urine analysis:    Component Value Date/Time   COLORURINE YELLOW 02/07/2020 0104   APPEARANCEUR CLOUDY (A) 02/07/2020 0104   LABSPEC 1.019 02/07/2020 0104   PHURINE 5.0 02/07/2020 0104   GLUCOSEU NEGATIVE 02/07/2020 0104   HGBUR NEGATIVE 02/07/2020 0104   BILIRUBINUR NEGATIVE 02/07/2020 0104   KETONESUR NEGATIVE 02/07/2020 0104   PROTEINUR NEGATIVE 02/07/2020 0104   NITRITE NEGATIVE 02/07/2020 0104   LEUKOCYTESUR NEGATIVE 02/07/2020 0104   Sepsis Labs: @LABRCNTIP (procalcitonin:4,lacticidven:4)  ) Recent Results (from the past 240 hour(s))  SARS Coronavirus 2 by RT PCR (hospital order, performed in [place] [facility] hospital lab) Nasopharyngeal Nasopharyngeal Swab     Status: None   Collection Time: 02/07/20  3:57 PM   Specimen: Nasopharyngeal Swab  Result Value Ref Range Status   SARS Coronavirus 2 NEGATIVE NEGATIVE Final    Comment: (NOTE) SARS-CoV-2 target nucleic acids are NOT DETECTED.  The SARS-CoV-2 RNA is generally detectable in upper and lower respiratory specimens during the acute phase of infection. The lowest concentration of SARS-CoV-2 viral copies this assay can detect is 250 copies / mL. A negative result does not  preclude SARS-CoV-2 infection and should not be used as the sole basis for treatment or other patient management decisions.  A negative result may occur with improper specimen collection / handling, submission of specimen other than nasopharyngeal swab, presence of viral mutation(s) within the areas targeted by this assay, and inadequate number of viral copies (<250 copies / mL). A negative result must be combined with clinical observations, patient history, and epidemiological information.  Fact Sheet for Patients:   StrictlyIdeas.no  Fact Sheet for Healthcare Providers: BankingDealers.co.za  This test is not yet approved or  cleared by the [place] FDA and has been authorized for detection and/or diagnosis of SARS-CoV-2 by FDA under an Emergency Use Authorization (EUA).  This EUA will remain in effect (meaning this test can be used) for the  duration of the COVID-19 declaration under Section 564(b)(1) of the Act, 21 U.S.C. section 360bbb-3(b)(1), unless the authorization is terminated or revoked sooner.  Performed at [facility], [place] [address] [place], [place] [address]       Studies: No results found.  Scheduled Meds: . atorvastatin  10 mg Oral Daily  . diltiazem  180 mg Oral Daily  . dorzolamide-timolol  1 drop Both Eyes BID  . insulin aspart  0-15 Units Subcutaneous TID WC  . insulin aspart  0-5 Units Subcutaneous QHS  . latanoprost  1 drop Both Eyes QHS  . levothyroxine  75 mcg Oral QAC breakfast  . metoprolol succinate  100 mg Oral Daily  . senna-docusate  2 tablet Oral BID    Continuous Infusions:   LOS: 4 days     Kayleen Memos, MD [facility] Pager [phone]  If 7PM-7AM, please contact night-coverage [email or web address] Password [facility] 02/11/2020, 12:54 PM

## 2020-02-12 ENCOUNTER — Encounter (HOSPITAL_COMMUNITY): Payer: Self-pay | Admitting: Physical Medicine & Rehabilitation

## 2020-02-12 ENCOUNTER — Other Ambulatory Visit: Payer: Self-pay

## 2020-02-12 ENCOUNTER — Encounter (HOSPITAL_COMMUNITY): Payer: Self-pay | Admitting: Internal Medicine

## 2020-02-12 ENCOUNTER — Inpatient Hospital Stay (HOSPITAL_COMMUNITY)
Admission: RE | Admit: 2020-02-12 | Discharge: 2020-02-26 | DRG: 560 | Disposition: A | Discharge status: Other Acute Inpatient Hospital | Payer: Medicare Other | Source: Intra-hospital | Attending: Physical Medicine & Rehabilitation | Admitting: Physical Medicine & Rehabilitation

## 2020-02-12 DIAGNOSIS — Z833 Family history of diabetes mellitus: Secondary | ICD-10-CM | POA: Diagnosis not present

## 2020-02-12 DIAGNOSIS — D62 Acute posthemorrhagic anemia: Secondary | ICD-10-CM | POA: Diagnosis present

## 2020-02-12 DIAGNOSIS — Z79890 Hormone replacement therapy (postmenopausal): Secondary | ICD-10-CM | POA: Diagnosis not present

## 2020-02-12 DIAGNOSIS — M8008XS Age-related osteoporosis with current pathological fracture, vertebra(e), sequela: Secondary | ICD-10-CM | POA: Diagnosis not present

## 2020-02-12 DIAGNOSIS — Z7901 Long term (current) use of anticoagulants: Secondary | ICD-10-CM | POA: Diagnosis not present

## 2020-02-12 DIAGNOSIS — J90 Pleural effusion, not elsewhere classified: Secondary | ICD-10-CM | POA: Diagnosis not present

## 2020-02-12 DIAGNOSIS — I10 Essential (primary) hypertension: Secondary | ICD-10-CM | POA: Diagnosis present

## 2020-02-12 DIAGNOSIS — W19XXXD Unspecified fall, subsequent encounter: Secondary | ICD-10-CM | POA: Diagnosis present

## 2020-02-12 DIAGNOSIS — J323 Chronic sphenoidal sinusitis: Secondary | ICD-10-CM | POA: Diagnosis not present

## 2020-02-12 DIAGNOSIS — S32511D Fracture of superior rim of right pubis, subsequent encounter for fracture with routine healing: Secondary | ICD-10-CM | POA: Diagnosis not present

## 2020-02-12 DIAGNOSIS — M8008XA Age-related osteoporosis with current pathological fracture, vertebra(e), initial encounter for fracture: Secondary | ICD-10-CM

## 2020-02-12 DIAGNOSIS — E538 Deficiency of other specified B group vitamins: Secondary | ICD-10-CM | POA: Diagnosis present

## 2020-02-12 DIAGNOSIS — M4854XD Collapsed vertebra, not elsewhere classified, thoracic region, subsequent encounter for fracture with routine healing: Secondary | ICD-10-CM | POA: Diagnosis present

## 2020-02-12 DIAGNOSIS — T07XXXA Unspecified multiple injuries, initial encounter: Secondary | ICD-10-CM | POA: Diagnosis not present

## 2020-02-12 DIAGNOSIS — E669 Obesity, unspecified: Secondary | ICD-10-CM | POA: Diagnosis present

## 2020-02-12 DIAGNOSIS — Z79899 Other long term (current) drug therapy: Secondary | ICD-10-CM | POA: Diagnosis not present

## 2020-02-12 DIAGNOSIS — E559 Vitamin D deficiency, unspecified: Secondary | ICD-10-CM | POA: Diagnosis present

## 2020-02-12 DIAGNOSIS — E1165 Type 2 diabetes mellitus with hyperglycemia: Secondary | ICD-10-CM | POA: Diagnosis present

## 2020-02-12 DIAGNOSIS — R531 Weakness: Secondary | ICD-10-CM | POA: Diagnosis not present

## 2020-02-12 DIAGNOSIS — M546 Pain in thoracic spine: Secondary | ICD-10-CM

## 2020-02-12 DIAGNOSIS — Z6837 Body mass index (BMI) 37.0-37.9, adult: Secondary | ICD-10-CM

## 2020-02-12 DIAGNOSIS — H409 Unspecified glaucoma: Secondary | ICD-10-CM | POA: Diagnosis present

## 2020-02-12 DIAGNOSIS — E039 Hypothyroidism, unspecified: Secondary | ICD-10-CM | POA: Diagnosis present

## 2020-02-12 DIAGNOSIS — D72829 Elevated white blood cell count, unspecified: Secondary | ICD-10-CM | POA: Diagnosis present

## 2020-02-12 DIAGNOSIS — Z66 Do not resuscitate: Secondary | ICD-10-CM | POA: Diagnosis present

## 2020-02-12 DIAGNOSIS — M47814 Spondylosis without myelopathy or radiculopathy, thoracic region: Secondary | ICD-10-CM | POA: Diagnosis not present

## 2020-02-12 DIAGNOSIS — T148XXA Other injury of unspecified body region, initial encounter: Secondary | ICD-10-CM

## 2020-02-12 DIAGNOSIS — I7090 Unspecified atherosclerosis: Secondary | ICD-10-CM | POA: Diagnosis not present

## 2020-02-12 DIAGNOSIS — J3489 Other specified disorders of nose and nasal sinuses: Secondary | ICD-10-CM | POA: Diagnosis not present

## 2020-02-12 DIAGNOSIS — I480 Paroxysmal atrial fibrillation: Secondary | ICD-10-CM | POA: Diagnosis present

## 2020-02-12 DIAGNOSIS — I4891 Unspecified atrial fibrillation: Secondary | ICD-10-CM | POA: Diagnosis not present

## 2020-02-12 DIAGNOSIS — M4804 Spinal stenosis, thoracic region: Secondary | ICD-10-CM | POA: Diagnosis not present

## 2020-02-12 DIAGNOSIS — I700 Atherosclerosis of aorta: Secondary | ICD-10-CM | POA: Diagnosis not present

## 2020-02-12 DIAGNOSIS — Z01810 Encounter for preprocedural cardiovascular examination: Secondary | ICD-10-CM | POA: Diagnosis not present

## 2020-02-12 DIAGNOSIS — S32009A Unspecified fracture of unspecified lumbar vertebra, initial encounter for closed fracture: Secondary | ICD-10-CM | POA: Diagnosis not present

## 2020-02-12 DIAGNOSIS — J811 Chronic pulmonary edema: Secondary | ICD-10-CM | POA: Diagnosis not present

## 2020-02-12 DIAGNOSIS — S22089A Unspecified fracture of T11-T12 vertebra, initial encounter for closed fracture: Secondary | ICD-10-CM | POA: Diagnosis not present

## 2020-02-12 DIAGNOSIS — S22079A Unspecified fracture of T9-T10 vertebra, initial encounter for closed fracture: Secondary | ICD-10-CM | POA: Diagnosis not present

## 2020-02-12 DIAGNOSIS — Z7984 Long term (current) use of oral hypoglycemic drugs: Secondary | ICD-10-CM

## 2020-02-12 DIAGNOSIS — G9389 Other specified disorders of brain: Secondary | ICD-10-CM | POA: Diagnosis not present

## 2020-02-12 DIAGNOSIS — R600 Localized edema: Secondary | ICD-10-CM | POA: Diagnosis not present

## 2020-02-12 LAB — GLUCOSE, CAPILLARY
Glucose-Capillary: 125 mg/dL — ABNORMAL HIGH (ref 70–99)
Glucose-Capillary: 218 mg/dL — ABNORMAL HIGH (ref 70–99)
Glucose-Capillary: 222 mg/dL — ABNORMAL HIGH (ref 70–99)
Glucose-Capillary: 204 mg/dL — ABNORMAL HIGH (ref 70–99)

## 2020-02-12 MED ORDER — RIVAROXABAN 15 MG PO TABS
15.0000 mg | ORAL_TABLET | Freq: Every day | ORAL | Status: DC
  Administered 2020-02-12: 15 mg via ORAL
  Filled 2020-02-12: qty 1

## 2020-02-12 MED ORDER — POLYETHYLENE GLYCOL 3350 17 G PO PACK: 17.0000 g | PACK | Freq: Every day | ORAL | Status: DC | PRN

## 2020-02-12 MED ORDER — PROCHLORPERAZINE EDISYLATE 10 MG/2ML IJ SOLN: 5.0000 mg | Freq: Four times a day (QID) | INTRAMUSCULAR | Status: DC | PRN

## 2020-02-12 MED ORDER — PROCHLORPERAZINE 25 MG RE SUPP: 12.5000 mg | Freq: Four times a day (QID) | RECTAL | Status: DC | PRN

## 2020-02-12 MED ORDER — DIPHENHYDRAMINE HCL 12.5 MG/5ML PO ELIX: 12.5000 mg | ORAL_SOLUTION | Freq: Four times a day (QID) | ORAL | Status: DC | PRN

## 2020-02-12 MED ORDER — LEVOTHYROXINE SODIUM 75 MCG PO TABS
75.0000 ug | ORAL_TABLET | Freq: Every day | ORAL | Status: DC
  Administered 2020-02-13 – 2020-02-25 (×13): 75 ug via ORAL
  Filled 2020-02-12 (×13): qty 1

## 2020-02-12 MED ORDER — ZOLPIDEM TARTRATE 5 MG PO TABS
5.0000 mg | ORAL_TABLET | Freq: Every evening | ORAL | Status: DC | PRN
  Administered 2020-02-13 – 2020-02-14 (×2): 5 mg via ORAL
  Filled 2020-02-12 (×4): qty 1

## 2020-02-12 MED ORDER — LATANOPROST 0.005 % OP SOLN
1.0000 [drp] | Freq: Every day | OPHTHALMIC | Status: DC
  Administered 2020-02-13 – 2020-02-25 (×13): 1 [drp] via OPHTHALMIC
  Filled 2020-02-12: qty 2.5

## 2020-02-12 MED ORDER — FLEET ENEMA 7-19 GM/118ML RE ENEM: 1.0000 | ENEMA | Freq: Once | RECTAL | Status: DC | PRN

## 2020-02-12 MED ORDER — INSULIN ASPART 100 UNIT/ML ~~LOC~~ SOLN
0.0000 [IU] | Freq: Three times a day (TID) | SUBCUTANEOUS | Status: DC
  Administered 2020-02-13 – 2020-02-14 (×6): 2 [IU] via SUBCUTANEOUS
  Administered 2020-02-15 (×2): 1 [IU] via SUBCUTANEOUS
  Administered 2020-02-15: 3 [IU] via SUBCUTANEOUS
  Administered 2020-02-16: 1 [IU] via SUBCUTANEOUS
  Administered 2020-02-16: 3 [IU] via SUBCUTANEOUS
  Administered 2020-02-16: 2 [IU] via SUBCUTANEOUS
  Administered 2020-02-17: 1 [IU] via SUBCUTANEOUS
  Administered 2020-02-17: 2 [IU] via SUBCUTANEOUS
  Administered 2020-02-17: 3 [IU] via SUBCUTANEOUS
  Administered 2020-02-18: 1 [IU] via SUBCUTANEOUS
  Administered 2020-02-18: 2 [IU] via SUBCUTANEOUS
  Administered 2020-02-18 – 2020-02-19 (×3): 1 [IU] via SUBCUTANEOUS
  Administered 2020-02-19: 2 [IU] via SUBCUTANEOUS
  Administered 2020-02-20 (×2): 1 [IU] via SUBCUTANEOUS
  Administered 2020-02-20 – 2020-02-21 (×2): 2 [IU] via SUBCUTANEOUS
  Administered 2020-02-21: 3 [IU] via SUBCUTANEOUS
  Administered 2020-02-21: 1 [IU] via SUBCUTANEOUS
  Administered 2020-02-22 – 2020-02-23 (×6): 2 [IU] via SUBCUTANEOUS
  Administered 2020-02-24: 1 [IU] via SUBCUTANEOUS
  Administered 2020-02-24: 3 [IU] via SUBCUTANEOUS
  Administered 2020-02-24 – 2020-02-25 (×2): 1 [IU] via SUBCUTANEOUS
  Administered 2020-02-25: 3 [IU] via SUBCUTANEOUS
  Administered 2020-02-25: 1 [IU] via SUBCUTANEOUS

## 2020-02-12 MED ORDER — ACETAMINOPHEN 325 MG PO TABS
325.0000 mg | ORAL_TABLET | ORAL | Status: DC | PRN
  Administered 2020-02-15 – 2020-02-19 (×5): 650 mg via ORAL
  Filled 2020-02-12 (×7): qty 2

## 2020-02-12 MED ORDER — PROCHLORPERAZINE MALEATE 5 MG PO TABS
5.0000 mg | ORAL_TABLET | Freq: Four times a day (QID) | ORAL | Status: DC | PRN
  Administered 2020-02-15 – 2020-02-21 (×2): 10 mg via ORAL
  Filled 2020-02-12 (×3): qty 2

## 2020-02-12 MED ORDER — ALUM & MAG HYDROXIDE-SIMETH 200-200-20 MG/5ML PO SUSP: 30.0000 mL | ORAL | Status: DC | PRN

## 2020-02-12 MED ORDER — POLYETHYLENE GLYCOL 3350 17 G PO PACK: 17.0000 g | PACK | Freq: Every day | ORAL | 0 refills | Status: DC | PRN

## 2020-02-12 MED ORDER — SENNOSIDES-DOCUSATE SODIUM 8.6-50 MG PO TABS
2.0000 | ORAL_TABLET | Freq: Two times a day (BID) | ORAL | Status: DC
  Administered 2020-02-12 – 2020-02-23 (×19): 2 via ORAL
  Filled 2020-02-12 (×22): qty 2

## 2020-02-12 MED ORDER — INSULIN ASPART 100 UNIT/ML ~~LOC~~ SOLN
0.0000 [IU] | Freq: Every day | SUBCUTANEOUS | Status: DC
  Administered 2020-02-12: 2 [IU] via SUBCUTANEOUS

## 2020-02-12 MED ORDER — METOPROLOL SUCCINATE ER 50 MG PO TB24
100.0000 mg | ORAL_TABLET | Freq: Every day | ORAL | Status: DC
  Administered 2020-02-13 – 2020-02-25 (×13): 100 mg via ORAL
  Filled 2020-02-12 (×13): qty 2

## 2020-02-12 MED ORDER — FUROSEMIDE 20 MG PO TABS
20.0000 mg | ORAL_TABLET | Freq: Every day | ORAL | Status: DC
  Administered 2020-02-14 – 2020-02-21 (×8): 20 mg via ORAL
  Filled 2020-02-12 (×9): qty 1

## 2020-02-12 MED ORDER — TRAMADOL HCL 50 MG PO TABS
50.0000 mg | ORAL_TABLET | Freq: Four times a day (QID) | ORAL | Status: DC | PRN
  Administered 2020-02-13 – 2020-02-19 (×7): 50 mg via ORAL
  Filled 2020-02-12 (×7): qty 1

## 2020-02-12 MED ORDER — BISACODYL 10 MG RE SUPP: 10.0000 mg | Freq: Every day | RECTAL | Status: DC | PRN

## 2020-02-12 MED ORDER — DORZOLAMIDE HCL-TIMOLOL MAL 2-0.5 % OP SOLN
1.0000 [drp] | Freq: Two times a day (BID) | OPHTHALMIC | Status: DC
  Administered 2020-02-12 – 2020-02-25 (×27): 1 [drp] via OPHTHALMIC
  Filled 2020-02-12: qty 10

## 2020-02-12 MED ORDER — RIVAROXABAN 15 MG PO TABS
15.0000 mg | ORAL_TABLET | Freq: Every day | ORAL | Status: DC
  Administered 2020-02-13 – 2020-02-18 (×6): 15 mg via ORAL
  Filled 2020-02-12 (×6): qty 1

## 2020-02-12 MED ORDER — OXYCODONE HCL 5 MG PO TABS
5.0000 mg | ORAL_TABLET | ORAL | Status: DC | PRN
  Administered 2020-02-13: 5 mg via ORAL
  Administered 2020-02-13: 10 mg via ORAL
  Administered 2020-02-13: 5 mg via ORAL
  Administered 2020-02-14 – 2020-02-19 (×4): 10 mg via ORAL
  Administered 2020-02-20 – 2020-02-21 (×2): 5 mg via ORAL
  Filled 2020-02-12: qty 2
  Filled 2020-02-12: qty 1
  Filled 2020-02-12: qty 2
  Filled 2020-02-12 (×2): qty 1
  Filled 2020-02-12 (×3): qty 2
  Filled 2020-02-12: qty 1
  Filled 2020-02-12 (×3): qty 2

## 2020-02-12 MED ORDER — GUAIFENESIN-DM 100-10 MG/5ML PO SYRP: 5.0000 mL | ORAL_SOLUTION | Freq: Four times a day (QID) | ORAL | Status: DC | PRN

## 2020-02-12 MED ORDER — ATORVASTATIN CALCIUM 10 MG PO TABS
10.0000 mg | ORAL_TABLET | Freq: Every day | ORAL | Status: DC
  Administered 2020-02-13 – 2020-02-25 (×13): 10 mg via ORAL
  Filled 2020-02-12 (×13): qty 1

## 2020-02-12 MED ORDER — DILTIAZEM HCL ER COATED BEADS 180 MG PO CP24
180.0000 mg | ORAL_CAPSULE | Freq: Every day | ORAL | Status: DC
  Administered 2020-02-13 – 2020-02-15 (×3): 180 mg via ORAL
  Filled 2020-02-12 (×3): qty 1

## 2020-02-12 NOTE — Progress Notes (Signed)
Inpatient Rehabilitation Medication Review by a Pharmacist  A complete drug regimen review was completed for this patient to identify any potential clinically significant medication issues.  Clinically significant medication issues were identified:  no   Type of Medication Issue Identified Description of Issue Urgent (address now) Non-Urgent (address on AM team rounds) Plan Plan Accepted by Provider? (Yes / No / Pending AM Rounds)  Drug Interaction(s) (clinically significant)       Duplicate Therapy       Allergy       No Medication Administration End Date       Incorrect Dose       Additional Drug Therapy Needed       Other  Home meds not yet resumed: Metformin Vit D Vitamin B12 Non-urgent Watch CBGs/renal fxn to resume metformin Pending    Name of provider notified for urgent issues identified:   Provider Method of Notification:    For non-urgent medication issues to be resolved on team rounds tomorrow morning a [facility] [place] was sent to:    Pharmacist comments:   Time spent performing this drug regimen review (minutes):  20  Crystal Ashley D. Mina Marble, PharmD, BCPS, [place] 02/12/2020, 7:20 PM

## 2020-02-12 NOTE — H&P (Signed)
Physical Medicine and Rehabilitation Admission H&P    Chief Complaint  Patient presents with  . Functional deficits   . Right pelvic Fx and T10/T11 fractures    HPI:  Crystal Ashley is a 74 year old female with history of T2DM, CHF, A fib, fall on 01/31/20 who was evaluated in ED and plain films negative. Faall felt to be due to hypotension and she was encouraged to increase fluid intake. She has had problems ambulating with mid back pain, inability to perform ADLs and limited mobility due to pain. She was admitted for work up and found to be hypotensive with AKI. Renal ultrasound showed bilateral renal cortical atrophy without mass or hydronephrosis. Dr. Hollie Salk consulted and felt that patient with acute on chronic renal failure and recommended IVF for gentle hydration and holding Lasix/ARB for now. She was found to have non-displaced superior/inferior pubic rami fractures. Dr. Marlou Sa consulted and recommended WBAT.    Family requested CT back for work up due to pain. CT lumbar, thoracic and cervical spine done revealing acute T10 and T 11 fracture in setting of diffuse thoracic ankylosis, chronic L1 compression fracture with 45% loss of height. Patient without neurologic symptoms and no B/B issues.  Dr. Marcello Moores consulted for input and MRI lumbar/thoracic spine done showing acute T10 and T11 vertebral body fractures with 25% loss of height at T11 without retropulsion or cord compression. He recommended TLSO to be worn when out of bed X 3 months--no surgery indicated and treatment of osteoporosis. To follow up in 4 weeks with upright films.  AKI improved with hydration nd lasix resumed today. ABLA being monitored. Therapy ongoing and patient limited by pain and weakness. CIR recommended due to functional decline.      Review of Systems  Constitutional: Negative for chills, diaphoresis and fever.  HENT: Negative for hearing loss and tinnitus.   Eyes: Positive for blurred vision (left due to  cataract).  Respiratory: Negative for cough, shortness of breath and stridor.   Cardiovascular: Negative for chest pain, palpitations and leg swelling.  Gastrointestinal: Positive for heartburn. Negative for abdominal pain, constipation and vomiting.  Genitourinary: Negative for dysuria and flank pain.  Musculoskeletal: Positive for back pain and myalgias. Negative for joint pain.  Skin: Negative for rash.  Neurological: Positive for focal weakness and weakness. Negative for dizziness, tingling, sensory change and headaches.  Psychiatric/Behavioral: The patient does not have insomnia.     Past Medical History:  Diagnosis Date  . Atrial fibrillation ([place])   . Cancer ([place])   . Cataract   . Fracture 2001   left ankle  . Glaucoma   . Hypertension   . Thyroid disease     Past Surgical History:  Procedure Laterality Date  . CARDIOVERSION N/A 04/08/2018   Procedure: CARDIOVERSION;  Surgeon: Adrian Prows, MD;  Location: [facility] ENDOSCOPY;  Service: Cardiovascular;  Laterality: N/A;  . CARDIOVERSION N/A 05/22/2019   Procedure: CARDIOVERSION;  Surgeon: Adrian Prows, MD;  Location: [facility];  Service: Cardiovascular;  Laterality: N/A;  . FOOT SURGERY Left   . HYSTEROSCOPY     POLYPECTOMY    Family History  Problem Relation Age of Onset  . Diabetes Son   . Healthy Mother   . Healthy Father     Social History: Widowed 6 months ago. 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 Moved to [place] 6 years ago and lives with son and his fiance. Is very sedentary--sits and watches TV/computer.  She reports that she has never smoked. She has never used smokeless tobacco. She does not use any drugs or illicit substances. She has a mixed drink every 1-2 months. .    Allergies: No Known Allergies    Medications Prior to Admission  Medication Sig Dispense Refill  . atorvastatin (LIPITOR) 10 MG tablet Take 1 tablet (10 mg total) by mouth daily. 90 tablet 3  . cholecalciferol  (VITAMIN D) 1000 UNITS tablet Take 5,000 Units by mouth See admin instructions. Sun, Mon, Wed, friday    . cyanocobalamin (,VITAMIN B-12,) 1000 MCG/ML injection 1000 mcg (1 mg) injection once per week for four weeks, followed by 1000 mcg injection once per month. (Patient taking differently: Inject 1,000 mcg into the muscle every 30 (thirty) days. ) 30 mL 3  . diltiazem (CARDIZEM CD) 180 MG 24 hr capsule Take 1 capsule (180 mg total) by mouth daily. 30 capsule 2  . dorzolamide-timolol (COSOPT) 22.3-6.8 MG/ML ophthalmic solution Place 1 drop into both eyes 2 (two) times daily.     . furosemide (LASIX) 40 MG tablet Take 1 tablet (40 mg total) by mouth daily. 90 tablet 1  . levothyroxine (SYNTHROID) 75 MCG tablet Take 1 tablet (75 mcg total) by mouth daily before breakfast. 90 tablet 3  . metFORMIN (GLUCOPHAGE) 1000 MG tablet Take 1 tablet (1,000 mg total) by mouth daily with breakfast. 90 tablet 3  . metoprolol succinate (TOPROL-XL) 100 MG 24 hr tablet Take 1 tablet (100 mg total) by mouth daily. Take with or immediately following a meal. 90 tablet 1  . polyethylene glycol (MIRALAX / GLYCOLAX) 17 g packet Take 17 g by mouth daily as needed for mild constipation. 14 each 0  . Rivaroxaban (XARELTO) 15 MG TABS tablet Take 1 tablet (15 mg total) by mouth daily with supper. (Patient taking differently: Take 15 mg by mouth daily. ) 90 tablet 2  . travoprost, benzalkonium, (TRAVATAN) 0.004 % ophthalmic solution Place 1 drop into both eyes at bedtime.       Drug Regimen Review  Drug regimen was reviewed and remains appropriate with no significant issues identified   Home: Home Living Family/patient expects to be discharged to:: Private residence Living Arrangements: Children Available Help at Discharge: Family Type of Home: House Home Access: Stairs to enter Technical brewer of Steps: 3 Entrance Stairs-Rails: Can reach both Home Layout: Two level, Able to live on main level with  bedroom/bathroom Bathroom Shower/Tub: Multimedia programmer: Handicapped height Bathroom Accessibility: Yes Home Equipment: Civil engineer, contracting, Radio producer - single point, Environmental consultant - standard, Environmental consultant - 4 wheels, Other (comment) (grab bars in BR)  Lives With: Son, Other (Comment)   Functional History: Prior Function Level of Independence: Independent with assistive device(s)  Functional Status:  Mobility: Bed Mobility Overal bed mobility: Needs Assistance Bed Mobility: Supine to Sit Supine to sit: Mod assist, +2 for physical assistance, +2 for safety/equipment General bed mobility comments: minimal repositioning on bed Transfers Overall transfer level: Needs assistance Equipment used: Rolling walker (2 wheeled) Transfers: Sit to/from Stand Sit to Stand: Min assist, +2 physical assistance, +2 safety/equipment Ambulation/Gait Ambulation/Gait assistance: Min assist, +2 physical assistance, +2 safety/equipment Gait Distance (Feet):  (20+60) Assistive device: Rolling walker (2 wheeled), 1 person hand held assist, 2 person hand held assist Gait Pattern/deviations: Step-through pattern, Decreased stride length, Wide base of support General Gait Details: pt was able to be assisted to BR then to the hall with close guard on recliner Gait velocity: reduced  ADL: ADL Overall  ADL's : Needs assistance/impaired Eating/Feeding: Modified independent Grooming: Wash/dry hands, Wash/dry face, Set up, Supervision/safety Upper Body Bathing: Minimal assistance, Sitting Lower Body Bathing: Maximal assistance, Sit to/from stand Upper Body Dressing : Minimal assistance, Sitting Lower Body Dressing: Total assistance, Sit to/from stand Toilet Transfer: Minimal assistance, Comfort height toilet Toileting- Clothing Manipulation and Hygiene: Maximal assistance Functional mobility during ADLs: Minimal assistance General ADL Comments: Pt. given handout for back precautions and discussed with family and will  need further ed  Cognition: Cognition Overall Cognitive Status: Within Functional Limits for tasks assessed Orientation Level: Oriented X4 Cognition Arousal/Alertness: Awake/alert Behavior During Therapy: WFL for tasks assessed/performed Overall Cognitive Status: Within Functional Limits for tasks assessed   Physical Exam: Blood pressure (!) 155/86, pulse 78, temperature 98.4 F (36.9 C), temperature source Oral, resp. rate 14, height 5' (1.524 m), weight 91.2 kg, SpO2 99 %.   General: Alert and oriented x 3, No apparent distress, obese HEENT: Head is normocephalic, atraumatic, PERRLA, EOMI, sclera anicteric, oral mucosa pink and moist, dentition intact, ext ear canals clear,  Neck: Supple without JVD or lymphadenopathy Heart: Reg rate and rhythm. No murmurs rubs or gallops Chest: CTA bilaterally without wheezes, rales, or rhonchi; no distress Abdomen: Soft, non-tender, non-distended, bowel sounds positive. Extremities: No clubbing, cyanosis, or edema. Pulses are 2+ Skin: Clean and intact without signs of breakdown Neuro: Pt is cognitively appropriate with normal insight, memory, and awareness. Fine motor coordination is intact. No tremors. Pain with elevation of legs which limits MMT. Upper extremities 5/5 bilaterally. Psych: Pt's affect is appropriate. Pt is cooperative  Results for orders placed or performed during the hospital encounter of 02/06/20 (from the past 48 hour(s))  Glucose, capillary     Status: Abnormal   Collection Time: 02/10/20  8:32 PM  Result Value Ref Range   Glucose-Capillary 303 (H) 70 - 99 mg/dL    Comment: Glucose reference range applies only to samples taken after fasting for at least 8 hours.  CBC     Status: Abnormal   Collection Time: 02/11/20  4:28 AM  Result Value Ref Range   WBC 10.5 4.0 - 10.5 K/uL   RBC 3.46 (L) 3.87 - 5.11 MIL/uL   Hemoglobin 9.2 (L) 12.0 - 15.0 g/dL   HCT 31.8 (L) 36 - 46 %   MCV 91.9 80.0 - 100.0 fL   MCH 26.6 26.0 -  34.0 pg   MCHC 28.9 (L) 30.0 - 36.0 g/dL   RDW 17.2 (H) 11.5 - 15.5 %   Platelets 300 150 - 400 K/uL   nRBC 0.0 0.0 - 0.2 %    Comment: Performed at [facility] [address] [place], [place] [address]  Basic metabolic panel     Status: Abnormal   Collection Time: 02/11/20  4:28 AM  Result Value Ref Range   Sodium 138 135 - 145 mmol/L   Potassium 4.9 3.5 - 5.1 mmol/L   Chloride 102 98 - 111 mmol/L   CO2 29 22 - 32 mmol/L   Glucose, Bld 136 (H) 70 - 99 mg/dL    Comment: Glucose reference range applies only to samples taken after fasting for at least 8 hours.   BUN 20 8 - 23 mg/dL   Creatinine, Ser 0.92 0.44 - 1.00 mg/dL   Calcium 8.6 (L) 8.9 - 10.3 mg/dL   GFR calc non Af Amer >60 >60 mL/min   GFR calc Af Amer >60 >60 mL/min   Anion gap 7 5 - 15  Comment: Performed at [facility], [place] [address] [place], [place] [address]  Glucose, capillary     Status: Abnormal   Collection Time: 02/11/20  8:40 AM  Result Value Ref Range   Glucose-Capillary 150 (H) 70 - 99 mg/dL    Comment: Glucose reference range applies only to samples taken after fasting for at least 8 hours.  Glucose, capillary     Status: Abnormal   Collection Time: 02/11/20 12:40 PM  Result Value Ref Range   Glucose-Capillary 230 (H) 70 - 99 mg/dL    Comment: Glucose reference range applies only to samples taken after fasting for at least 8 hours.  Glucose, capillary     Status: Abnormal   Collection Time: 02/11/20  4:18 PM  Result Value Ref Range   Glucose-Capillary 195 (H) 70 - 99 mg/dL    Comment: Glucose reference range applies only to samples taken after fasting for at least 8 hours.  Glucose, capillary     Status: Abnormal   Collection Time: 02/11/20  8:36 PM  Result Value Ref Range   Glucose-Capillary 215 (H) 70 - 99 mg/dL    Comment: Glucose reference range applies only to samples taken after fasting for at least 8 hours.  Glucose, capillary     Status: Abnormal   Collection Time: 02/12/20   8:33 AM  Result Value Ref Range   Glucose-Capillary 125 (H) 70 - 99 mg/dL    Comment: Glucose reference range applies only to samples taken after fasting for at least 8 hours.  Glucose, capillary     Status: Abnormal   Collection Time: 02/12/20 11:36 AM  Result Value Ref Range   Glucose-Capillary 218 (H) 70 - 99 mg/dL    Comment: Glucose reference range applies only to samples taken after fasting for at least 8 hours.  Glucose, capillary     Status: Abnormal   Collection Time: 02/12/20  4:55 PM  Result Value Ref Range   Glucose-Capillary 222 (H) 70 - 99 mg/dL    Comment: Glucose reference range applies only to samples taken after fasting for at least 8 hours.   No results found.     Medical Problem List and Plan: 1.  Impaired mobility and ADLs secondary to multiple traumatic injuries following fall, possibly secondary to starting Cardizem.  -patient may shower but incision sites must be covered.   -ELOS/Goals: 10-14 days modI PT, OT, I in SLP 2.  Antithrombotics: -DVT/anticoagulation:  Pharmaceutical: Xarelto  -antiplatelet therapy:N/a 3. Pain Management: Continues to have pain--50% pain relief with hydrocodone. Will change to oxycodone and monitor. Continue robaxin tid with tylenol qid (decrease to 650 mg) 4. Mood: Team to provide ego support. LCSW to follow for evaluation and support.   -antipsychotic agents: N/A 5. Neuropsych: This patient is capable of making decisions on her own behalf. 6. Skin/Wound Care: Routine pressure relief measures.  7. Fluids/Electrolytes/Nutrition: Monitor I/O. Check lytes in am.  8. T10/T11 fractures: TLSO when at edge of bed and out of bed. Encouraged to wear in bed for support comfort if tolerated.  9. Right pelvic fracture: WBAT 10. T2DM: Hgb A1c- 6.8. will monitor BS ac/hs. Resume metformin once intake improves. 11. PAF: Monitor HR tid--continue Xarelto, Cardizem and Metoprolol XL daily.   12. ABLA: Due to fracture/hematoma. Will recheck H/H in  am.  13. Leucocytosis: Monitor for signs of infection--appears to be resolving.  14. Vitamin D deficiency: was on supplement at home. Will add Vitamin D level to 6/29 labs. 15. Vitamin  B12 deficiency: was on supplement at home. Will add Vitamin B level to 6/29 labs.  15. Disposition: Patient lives with her son and his fiance.    Reesa Chew, PA-C  I have personally performed a face to face diagnostic evaluation, including, but not limited to relevant history and physical exam findings, of this patient and developed relevant assessment and plan.  Additionally, I have reviewed and concur with the physician assistant's documentation above.  The patient's status has not changed. The original post admission physician evaluation remains appropriate, and any changes from the pre-admission screening or documentation from the acute chart are noted above.   Leeroy Cha, MD

## 2020-02-12 NOTE — Progress Notes (Signed)
Patient arrived on unit, oriented to unit. Reviewed medications, therapy schedule, rehab routine and plan of care. States an understanding of information reviewed. No complications noted at this time. [place]

## 2020-02-12 NOTE — Discharge Summary (Addendum)
Discharge Summary  Crystal Ashley RSW:[id number] DOB: 1946-04-25  PCP: Crystal Coke, PA  Admit date: 02/06/2020 Discharge date: 02/12/2020  Time spent: 35 minutes  Recommendations for Outpatient Follow-up:  1. Follow-up with your cardiologist 2. Follow-up with your primary care provider 3. Follow-up with neurosurgery 4. Continue PT OT with assistance and fall precautions 5. Take your medications as prescribed  Discharge Diagnoses:  Active Hospital Problems   Diagnosis Date Noted  . Closed fracture of multiple pubic rami, right, initial encounter ([facility]) 02/07/2020  . Atrial fibrillation, chronic ([place]) 02/07/2020  . Chronic combined systolic (congestive) and diastolic (congestive) heart failure ([place])   . Morbid obesity with BMI of 40.0-44.9, adult ([facility]) 09/23/2018  . Type 2 diabetes mellitus without complication, without long-term current use of insulin ([place]) 03/15/2018  . Hyperlipidemia associated with type 2 diabetes mellitus ([facility]) 01/04/2017  . Hypertension associated with diabetes ([place]) 04/19/2014  . Hypothyroidism 04/19/2014    Resolved Hospital Problems  No resolved problems to display.    Discharge Condition: Stable  Diet recommendation: Heart healthy carb modified diet.  Vitals:   02/12/20 0430 02/12/20 0839  BP: 120/69 135/67  Pulse: 73 67  Resp: 16 17  Temp: 98.9 F (37.2 C) (!) 97.5 F (36.4 C)  SpO2: 97% 100%    History of present illness:  Crystal Anil Agarwalis a 74 y.o.femalewith medical history significant ofhypothyroidism; HTN; afib on Xarelto; chronic combined CHF; and glaucoma presenting with back pain and hypotension.She reports falling on 01/31/20 and having ongoing back and R hip pain. She has had increased difficulty walking even with her walker. She is feeling better at this time. She is having normal urination and BMs. No numbness/weakness/tingling of her legs. Hospitalist team asked to admit.  Seen by Ortho Dr. Marlou Ashley.  Also seen by  neurosurgery, Dr. Marcello Ashley.  CT TL spine revealed T10 and T11 vertebral fracture as well as ankylosis finding L1-2 and L5-S1.  Neurosurgery recommended thoracic and lumbar MRI for further evaluation as well as TLSO brace when out of bed.  No evidence of disruption of the posterior tension band on MRI thoracic and lumbar spine per neurosurgery.  She will need to follow-up in neurosurgery clinic in 4 weeks for an upright x-ray.  Evaluated by CIR.  02/12/20: Seen and examined at her bedside.    No acute events overnight.  She has no new complaints.  We will continue PT OT at inpatient rehab.  She has a bed available.    Neurosurgery's recommendations 02/08/20: Neurosurgery  MRI reviewed.  T10 body fracture redemonstrated as well as mild T11 body fracture.  There is some STIR signal seen in paraspinous musculature but no disruption of the posterior tension band.  Will continue with plan for TLSO brace treatment to be worn when out of bed.  She will need to f/u in neurosurgery clinic in 4 weeks with an upright x-ray.   Orthopedic surgery's recommendations 02/08/20: Regarding the pubic rami fracture she can be weightbearing as tolerated with a walker.  She will need to follow-up with [place] in about 3 weeks for clinical recheck on that.  Hospital Course:  Principal Problem:   Closed fracture of multiple pubic rami, right, initial encounter ([place]) Active Problems:   Hypertension associated with diabetes ([facility])   Hypothyroidism   Hyperlipidemia associated with type 2 diabetes mellitus ([place])   Type 2 diabetes mellitus without complication, without long-term current use of insulin ([place])   Morbid obesity with BMI of 40.0-44.9, adult ([facility])   Chronic  combined systolic (congestive) and diastolic (congestive) heart failure ([facility])   Atrial fibrillation, chronic ([place])  Post fall 01/31/20/Acute T10 T11 vertebral fractures/Diffuse ankylosis of the spine -Patient with a fall on 6/16; she was seen in the ER with  xrays of thoracic and lumbar spine which were unrevealing. -Her cousin is a physician in [place], recommends CT of thoracic and lumbar spine -CT scan done on 02/07/2020 showed T10-T11 vertebral fractures and lumbar spondylosis with moderate spinal stenosis at L4-L5, chronic mild L1 compression fracture on CT scan. -Seen by orthopedic surgery and neurosurgery -MRI thoracic and lumbar ordered  No evidence of disruption of the posterior tension band -Conservative management recommended with TLSO brace when out of bed x3 months -She will need to follow-up in neurosurgery clinic in 4 weeks for an upright x-ray. -Pain control in place, continue bowel regimen -OT recommend CIR -CIR evaluated, bed available 02/12/20. -Continue out of bed to chair as tolerated -Ambulate with every shift as tolerated using TLSO when out of bed. -Continue fall precautions.  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 Renal function is back to baseline. She is on p.o. Lasix 40 mg daily at home,  continue. Valsartan, continue to hold due to AKI, no proteinuria on UA done on 02/07/2020, not hypertensive, last LVEF 55 to 60%.  Defer to cardiology to restart. Renal ultrasound shows bilateral renal cortical atrophy, small amount of nonspecific right perinephric fluid.  No evidence of renal mass or hydronephrosis. UA Negative for proteinuria and pyuria. Continue to avoid nephrotoxins and hypotension  Acute blood loss anemia in the setting of acute fractures Hemoglobin from 11 to 9.2 K. No overt bleeding Xarelto restarted Follow-up with PCP and cardiology  Permanent Afib Rate controlled on Toprol and Cardizem On Xarelto for CVA prevention Follow-up with cardiology  Essential hypertension  Initially hypotensive Blood pressure is currently stable She is on diltiazem and Toprol-XL,  continue P.o. Lasix restarted on 02/11/2020 Valsartan on hold Continue to monitor vital signs  Chronic diastolic CHF -10/18/72 echo with preserved EF and indeterminate diastolic function -Appears compensated at this time -BNP 171 on admission from 397 in March -Euvolemic on exam -Home p.o. diuretics restarted -Continue strict I's and O's and daily weight -Continue cardiac medications as indicated above.  Resolved leukocytosis, likely reactive in the setting of acute fractures UA negative, chest x-ray unremarkable. Leukocytosis has resolved Afebrile with no evidence of active infective process.  HLD -Continue Lipitor  Type 2 diabetes with hyperglycemia -A1cwas 6.8 on 11/29/19 -Resume home regimen -Follow-up with your PCP  Morbid obesity -Weight loss should be encouraged outpatient  Hypothyroidism -Recent normalTSH -Continue Synthroid at home dose.  Glaucoma -Continue Cosopt, Travatan  Ambulatory dysfunction  PT OT to assess, follow orthopedic surgery and neurosurgery's recommendations Out of bed to chair with every shift Ambulate as tolerated with every shift Continue fall precautions     Code Status:DNR- confirmed with patient   Consults called:Orthopedics; Nephrology; neurosurgery.     Discharge Exam: BP 135/67 (BP Location: Left Arm)   Pulse 67   Temp (!) 97.5 F (36.4 C) (Oral)   Resp 17   Ht 5' (1.524 m)   Wt 92.2 kg   SpO2 100%   BMI 39.70 kg/m  . General: 74 y.o. year-old female well developed well nourished in no acute distress.  Alert and oriented x3. . Cardiovascular: Regular rate and  rhythm with no rubs or gallops.  No thyromegaly or JVD noted.   Marland Kitchen Respiratory: Clear to auscultation with no wheezes or rales. Good inspiratory effort. . Abdomen: Soft nontender nondistended with normal bowel sounds x4 quadrants. . Musculoskeletal: No lower extremity edema. 2/4 pulses in all 4 extremities. . Skin: No ulcerative lesions noted or  rashes, . Psychiatry: Mood is appropriate for condition and setting  Discharge Instructions You were cared for by a hospitalist during your hospital stay. If you have any questions about your discharge medications or the care you received while you were in the hospital after you are discharged, you can call the unit and asked to speak with the hospitalist on call if the hospitalist that took care of you is not available. Once you are discharged, your primary care physician will handle any further medical issues. Please note that NO REFILLS for any discharge medications will be authorized once you are discharged, as it is imperative that you return to your primary care physician (or establish a relationship with a primary care physician if you do not have one) for your aftercare needs so that they can reassess your need for medications and monitor your lab values.   Allergies as of 02/12/2020   No Known Allergies     Medication List    STOP taking these medications   valsartan 80 MG tablet Commonly known as: DIOVAN     TAKE these medications   atorvastatin 10 MG tablet Commonly known as: LIPITOR Take 1 tablet (10 mg total) by mouth daily.   cholecalciferol 1000 units tablet Commonly known as: VITAMIN D Take 5,000 Units by mouth See admin instructions. Sun, Mon, Wed, friday   cyanocobalamin 1000 MCG/ML injection Commonly known as: (VITAMIN B-12) 1000 mcg (1 mg) injection once per week for four weeks, followed by 1000 mcg injection once per month. What changed:   how much to take  how to take this  when to take this  additional instructions   diltiazem 180 MG 24 hr capsule Commonly known as: CARDIZEM CD Take 1 capsule (180 mg total) by mouth daily.   dorzolamide-timolol 22.3-6.8 MG/ML ophthalmic solution Commonly known as: COSOPT Place 1 drop into both eyes 2 (two) times daily.   furosemide 40 MG tablet Commonly known as: LASIX Take 1 tablet (40 mg total) by mouth  daily.   levothyroxine 75 MCG tablet Commonly known as: SYNTHROID Take 1 tablet (75 mcg total) by mouth daily before breakfast.   metFORMIN 1000 MG tablet Commonly known as: GLUCOPHAGE Take 1 tablet (1,000 mg total) by mouth daily with breakfast.   metoprolol succinate 100 MG 24 hr tablet Commonly known as: TOPROL-XL Take 1 tablet (100 mg total) by mouth daily. Take with or immediately following a meal.   polyethylene glycol 17 g packet Commonly known as: MIRALAX / GLYCOLAX Take 17 g by mouth daily as needed for mild constipation.   Rivaroxaban 15 MG Tabs tablet Commonly known as: XARELTO Take 1 tablet (15 mg total) by mouth daily with supper. What changed: when to take this   travoprost (benzalkonium) 0.004 % ophthalmic solution Commonly known as: TRAVATAN Place 1 drop into both eyes at bedtime.            Durable Medical Equipment  (From admission, onward)         Start     Ordered   02/10/20 0616  For home use only DME Walker rolling  Once       Question Answer  Comment  Walker: With 5 Inch Wheels   Patient needs a walker to treat with the following condition Ambulatory dysfunction      02/10/20 0615         No Known Allergies  Follow-up Information    Crystal Ashley, [place]. Call in 1 day(s).   Specialty: Physician Assistant Why: please call for a post hospital follow up appointment Contact information: [place] [address] [phone]        [name], MD. Call in 1 day(s).   Specialty: Neurosurgery Why: please call for a post hospital follow up appointment Contact information: [place] [address] [phone]        Adrian Prows, MD. Call in 1 day(s).   Specialty: Cardiology Why: Please call for a post hospital follow-up appointment. Contact information: [place] [address] [phone]        Meredith Pel, MD. Call in 1 day(s).   Specialty:  Orthopedic Surgery Why: Please call for a post hospital follow-up appointment. Contact information: [address] [place] [address] [phone]                The results of significant diagnostics from this hospitalization (including imaging, microbiology, ancillary and laboratory) are listed below for reference.    Significant Diagnostic Studies: DG Chest 2 View  Result Date: 02/07/2020 CLINICAL DATA:  Trauma secondary to a fall. Neck tenderness. Right-sided pelvic pain. EXAM: CHEST - 2 VIEW COMPARISON:  10/18/2018 FINDINGS: The heart size and mediastinal contours are within normal limits. Both lungs are clear. The visualized skeletal structures are unremarkable. IMPRESSION: No active cardiopulmonary disease. Electronically Signed   By: Lorriane Shire M.D.   On: 02/07/2020 07:40   DG Cervical Spine Complete  Result Date: 02/07/2020 CLINICAL DATA:  Neck tenderness after a fall on 01/31/2020 EXAM: CERVICAL SPINE - COMPLETE 4+ VIEW COMPARISON:  None. FINDINGS: There is no fracture or prevertebral soft tissue swelling. Anterior osteophytes fuse the cervical spine from C4-C7. Alignment is normal. No significant facet arthritis. No appreciable foraminal stenosis. Chronic calcification in the nuchal ligament at C5-6. IMPRESSION: No acute abnormality. Anterior osteophytes fuse the cervical spine from C4-C7. Electronically Signed   By: Lorriane Shire M.D.   On: 02/07/2020 07:42   DG Thoracic Spine 2 View  Result Date: 01/31/2020 CLINICAL DATA:  Back pain after fall EXAM: THORACIC SPINE 2 VIEWS; LUMBAR SPINE - COMPLETE 4+ VIEW COMPARISON:  10/18/2018 FINDINGS: Mild superior endplate compression deformity at L1 is unchanged from lateral chest radiograph 10/18/2018. Remaining vertebral body heights are maintained. No acute fracture identified. No static listhesis. Bridging anterior endplate osteophytosis within the lumbar spine. Lower lumbar facet arthrosis. Thoracic intervertebral disc  spaces are preserved. IMPRESSION: 1. No evidence of acute fracture or static listhesis of the thoracic or lumbar spine. 2. Unchanged mild L1 superior endplate compression deformity. Electronically Signed   By: Davina Poke D.O.   On: 01/31/2020 13:32   DG Lumbar Spine Complete  Result Date: 01/31/2020 CLINICAL DATA:  Back pain after fall EXAM: THORACIC SPINE 2 VIEWS; LUMBAR SPINE - COMPLETE 4+ VIEW COMPARISON:  10/18/2018 FINDINGS: Mild superior endplate compression deformity at L1 is unchanged from lateral chest radiograph 10/18/2018. Remaining vertebral body heights are maintained. No acute fracture identified. No static listhesis. Bridging anterior endplate osteophytosis within the lumbar spine. Lower lumbar facet arthrosis. Thoracic intervertebral disc spaces are preserved. IMPRESSION: 1. No evidence of acute fracture or  static listhesis of the thoracic or lumbar spine. 2. Unchanged mild L1 superior endplate compression deformity. Electronically Signed   By: Davina Poke D.O.   On: 01/31/2020 13:32   CT HEAD WO CONTRAST  Result Date: 01/31/2020 CLINICAL DATA:  Near syncope EXAM: CT HEAD WITHOUT CONTRAST TECHNIQUE: Contiguous axial images were obtained from the base of the skull through the vertex without intravenous contrast. COMPARISON:  None. FINDINGS: Brain: No acute territorial infarction, hemorrhage or intracranial mass. Moderate atrophy. Mild hypodensity in the white matter consistent with chronic small vessel ischemic change. Slightly enlarged ventricles presumably due to atrophy. Vascular: No hyperdense vessels.  Carotid vascular calcification Skull: Normal. Negative for fracture or focal lesion. Sinuses/Orbits: Mucosal thickening in the sphenoid sinuses. Other: None IMPRESSION: 1. No CT evidence for acute intracranial abnormality. 2. Atrophy and mild chronic small vessel ischemic changes of the white matter. Electronically Signed   By: Donavan Foil M.D.   On: 01/31/2020 18:10   CT  THORACIC SPINE WO CONTRAST  Result Date: 02/07/2020 CLINICAL DATA:  Back pain after a fall. EXAM: CT THORACIC AND LUMBAR SPINE WITHOUT CONTRAST TECHNIQUE: Multidetector CT imaging of the thoracic and lumbar spine was performed without contrast. Multiplanar CT image reconstructions were also generated. COMPARISON:  Thoracic and lumbar spine radiographs 01/31/2020 FINDINGS: CT THORACIC SPINE FINDINGS Alignment: No listhesis. Vertebrae: Diffuse ankylosis of the thoracic spine by bridging syndesmophytes. Diffuse supraspinous ligament ossification from T3 to T12 with focal discontinuity at T7-8 which has a chronic appearance. There is a horizontal fracture through the inferior portion of the T10 vertebral body with 8 mm of distraction anteriorly. There is also a fracture of the T11 superior endplate with slight vertebral body height loss and with extension into the pedicles. Paraspinal and other soft tissues: Paravertebral soft tissue edema/hematoma at T10 and T11. Disc levels: No evidence of significant thoracic spinal canal stenosis. Bilateral neural foraminal stenosis at T10-11 due to facet spurring. Incompletely evaluated cervical spondylosis. CT LUMBAR SPINE FINDINGS Segmentation: 5 lumbar type vertebrae. Alignment: Normal. Vertebrae: Chronic mild L1 superior endplate compression fracture. Interbody ankylosis at L1-2 and L5-S1. Paraspinal and other soft tissues: No acute finding. Disc levels: Disc bulging, endplate spurring, and facet arthrosis resulting in mild spinal stenosis at L3-4 and moderate spinal stenosis at L4-5 as well as mild-to-moderate neural foraminal stenosis at L4-5 and L5-S1. IMPRESSION: 1. Acute T10 and T11 vertebral fractures as above in the setting of diffuse thoracic ankylosis (chalk stick fracture). 2. 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 3. Chronic mild L1 compression fracture. Electronically Signed   By: Logan Bores M.D.   On: 02/07/2020 18:17   CT LUMBAR SPINE WO  CONTRAST  Result Date: 02/07/2020 CLINICAL DATA:  Back pain after a fall. EXAM: CT THORACIC AND LUMBAR SPINE WITHOUT CONTRAST TECHNIQUE: Multidetector CT imaging of the thoracic and lumbar spine was performed without contrast. Multiplanar CT image reconstructions were also generated. COMPARISON:  Thoracic and lumbar spine radiographs 01/31/2020 FINDINGS: CT THORACIC SPINE FINDINGS Alignment: No listhesis. Vertebrae: Diffuse ankylosis of the thoracic spine by bridging syndesmophytes. Diffuse supraspinous ligament ossification from T3 to T12 with focal discontinuity at T7-8 which has a chronic appearance. There is a horizontal fracture through the inferior portion of the T10 vertebral body with 8 mm of distraction anteriorly. There is also a fracture of the T11 superior endplate with slight vertebral body height loss and with extension into the pedicles. Paraspinal and other soft tissues: Paravertebral soft tissue edema/hematoma at T10 and T11. Disc  levels: No evidence of significant thoracic spinal canal stenosis. Bilateral neural foraminal stenosis at T10-11 due to facet spurring. Incompletely evaluated cervical spondylosis. CT LUMBAR SPINE FINDINGS Segmentation: 5 lumbar type vertebrae. Alignment: Normal. Vertebrae: Chronic mild L1 superior endplate compression fracture. Interbody ankylosis at L1-2 and L5-S1. Paraspinal and other soft tissues: No acute finding. Disc levels: Disc bulging, endplate spurring, and facet arthrosis resulting in mild spinal stenosis at L3-4 and moderate spinal stenosis at L4-5 as well as mild-to-moderate neural foraminal stenosis at L4-5 and L5-S1. IMPRESSION: 1. Acute T10 and T11 vertebral fractures as above in the setting of diffuse thoracic ankylosis (chalk stick fracture). 2. 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 3. Chronic mild L1 compression fracture. Electronically Signed   By: Logan Bores M.D.   On: 02/07/2020 18:17   CT PELVIS WO CONTRAST  Result Date:  02/07/2020 CLINICAL DATA:  Lower back pain. EXAM: CT PELVIS WITHOUT CONTRAST TECHNIQUE: Multidetector CT imaging of the pelvis was performed following the standard protocol without intravenous contrast. COMPARISON:  None. FINDINGS: Urinary Tract:  No abnormality visualized. Bowel:  Unremarkable visualized pelvic bowel loops. Vascular/Lymphatic: No pathologically enlarged lymph nodes. No significant vascular abnormality seen. Reproductive:  The uterus is surgically absent. Other:  None. Musculoskeletal: Marked severity degenerative changes are seen at the level of L5-S1. IMPRESSION: Marked severity degenerative changes at the level of L5-S1. Electronically Signed   By: Virgina Norfolk M.D.   On: 02/07/2020 15:57   MR THORACIC SPINE WO CONTRAST  Result Date: 02/08/2020 CLINICAL DATA:  Fracture, hip.  History of fall. EXAM: MRI THORACIC AND LUMBAR SPINE WITHOUT CONTRAST TECHNIQUE: Multiplanar and multiecho pulse sequences of the thoracic and lumbar spine were obtained without intravenous contrast. COMPARISON:  CT of the thoracolumbar spine February 07, 2020 FINDINGS: MRI THORACIC SPINE FINDINGS Alignment:  Physiologic. Vertebrae: A fracture in the inferior aspect of T10 extending to the inferior endplate is noted with fluid attenuation within the inferior fragment which does not appear significantly displaced. No significant loss of vertebral body height. There is also a fracture of the superior endplate of P29 with loss of approximately 25% of the vertebral body height. Marrow edema is noted in both T10 and T11 vertebral bodies extending into the pedicles bilaterally, consistent with acute fracture. No retropulsion seen. Cord:  Normal signal and morphology. Paraspinal and other soft tissues: 1 cm lesion within the right adrenal gland with signal similar to fat on the T2 and gradient echo sequences, suggestive of adrenal myelolipoma. Small bilateral pleural effusions Disc levels: T1-2 through T9-10: No significant  spinal canal or neural foraminal stenosis. T10-11: Facet degenerative changes and ligamentum flavum redundancy resulting in mild bilateral. T11-12: Facet degenerative changes and ligamentum flavum redundancy without significant spinal canal or neural foraminal stenosis. MRI LUMBAR SPINE FINDINGS Segmentation:  Standard. Alignment:  Physiologic. Vertebrae: Chronic compression fracture of the L1 vertebral body resulting in approximately 45% loss of vertebral body height without associated marrow edema or retropulsion. Conus medullaris and cauda equina: Conus extends to the T12-L1 level. Conus and cauda equina appear normal. Paraspinal and other soft tissues: Negative. Disc levels: T12-L1: Small posterior disc osteophyte complex and mild facet degenerative changes without significant spinal canal or neural foraminal stenosis. L1-2: Mild facet degenerative changes. No significant spinal canal or neural foraminal stenosis. L2-3: Mild facet degenerative changes. No spinal canal or neural foraminal stenosis. L3-4: Mild facet degenerative changes. No spinal canal or neural foraminal stenosis. L4-5: Disc bulge, prominent facet degenerative changes and ligamentum flavum redundancy  resulting in mild spinal canal stenosis with effacement of the bilateral subarticular zone and mild bilateral neural foraminal narrowing. L5-S1: Disc calcification, shallow disc bulge and facet degenerative changes with ligamentum flavum redundancy resulting in narrowing of the bilateral subarticular zones and mild bilateral neural foraminal narrowing. IMPRESSION: 1. Acute fracture of the T10 and T11 vertebral bodies with loss of approximately 25% of vertebral body height at T11. No retropulsion or cord compression seen. 2. Chronic compression fracture of the L1 vertebral body resulting in approximately 45% loss of vertebral body height without associated marrow edema or retropulsion. 3. Mild degenerative changes with narrowing of the subarticular  zones and mild bilateral neural foraminal narrowing at L4-5 and L5-S1. 4. Small bilateral pleural effusions. Electronically Signed   By: Pedro Earls M.D.   On: 02/08/2020 13:41   MR LUMBAR SPINE WO CONTRAST  Result Date: 02/08/2020 CLINICAL DATA:  Fracture, hip.  History of fall. EXAM: MRI THORACIC AND LUMBAR SPINE WITHOUT CONTRAST TECHNIQUE: Multiplanar and multiecho pulse sequences of the thoracic and lumbar spine were obtained without intravenous contrast. COMPARISON:  CT of the thoracolumbar spine February 07, 2020 FINDINGS: MRI THORACIC SPINE FINDINGS Alignment:  Physiologic. Vertebrae: A fracture in the inferior aspect of T10 extending to the inferior endplate is noted with fluid attenuation within the inferior fragment which does not appear significantly displaced. No significant loss of vertebral body height. There is also a fracture of the superior endplate of W54 with loss of approximately 25% of the vertebral body height. Marrow edema is noted in both T10 and T11 vertebral bodies extending into the pedicles bilaterally, consistent with acute fracture. No retropulsion seen. Cord:  Normal signal and morphology. Paraspinal and other soft tissues: 1 cm lesion within the right adrenal gland with signal similar to fat on the T2 and gradient echo sequences, suggestive of adrenal myelolipoma. Small bilateral pleural effusions Disc levels: T1-2 through T9-10: No significant spinal canal or neural foraminal stenosis. T10-11: Facet degenerative changes and ligamentum flavum redundancy resulting in mild bilateral. T11-12: Facet degenerative changes and ligamentum flavum redundancy without significant spinal canal or neural foraminal stenosis. MRI LUMBAR SPINE FINDINGS Segmentation:  Standard. Alignment:  Physiologic. Vertebrae: Chronic compression fracture of the L1 vertebral body resulting in approximately 45% loss of vertebral body height without associated marrow edema or retropulsion. Conus  medullaris and cauda equina: Conus extends to the T12-L1 level. Conus and cauda equina appear normal. Paraspinal and other soft tissues: Negative. Disc levels: T12-L1: Small posterior disc osteophyte complex and mild facet degenerative changes without significant spinal canal or neural foraminal stenosis. L1-2: Mild facet degenerative changes. No significant spinal canal or neural foraminal stenosis. L2-3: Mild facet degenerative changes. No spinal canal or neural foraminal stenosis. L3-4: Mild facet degenerative changes. No spinal canal or neural foraminal stenosis. L4-5: Disc bulge, prominent facet degenerative changes and ligamentum flavum redundancy resulting in mild spinal canal stenosis with effacement of the bilateral subarticular zone and mild bilateral neural foraminal narrowing. L5-S1: Disc calcification, shallow disc bulge and facet degenerative changes with ligamentum flavum redundancy resulting in narrowing of the bilateral subarticular zones and mild bilateral neural foraminal narrowing. IMPRESSION: 1. Acute fracture of the T10 and T11 vertebral bodies with loss of approximately 25% of vertebral body height at T11. No retropulsion or cord compression seen. 2. Chronic compression fracture of the L1 vertebral body resulting in approximately 45% loss of vertebral body height without associated marrow edema or retropulsion. 3. Mild degenerative changes with narrowing of the subarticular zones  and mild bilateral neural foraminal narrowing at L4-5 and L5-S1. 4. Small bilateral pleural effusions. Electronically Signed   By: Pedro Earls M.D.   On: 02/08/2020 13:41   US RENAL  Result Date: 02/07/2020 CLINICAL DATA:  Stage III chronic kidney disease, history hypertension, atrial fibrillation, type II diabetes mellitus EXAM: RENAL / URINARY TRACT ULTRASOUND COMPLETE COMPARISON:  None FINDINGS: Right Kidney: Renal measurements: 9.2 x 5.0 x 5.2 cm = volume: 124 mL. Cortical thinning. Normal  cortical echogenicity. No mass, hydronephrosis, or shadowing calcification. Small amount of nonspecific perinephric fluid. Left Kidney: Renal measurements: 8.8 x 6.1 x 4.1 cm = volume: 114 mL. Cortical thinning. Normal cortical echogenicity. No mass, hydronephrosis or shadowing calcification. Bladder: Appears normal for degree of bladder distention. Other: None. IMPRESSION: BILATERAL renal cortical atrophy. Small amount of nonspecific RIGHT perinephric fluid. No evidence of renal mass or hydronephrosis. Electronically Signed   By: Lavonia Dana M.D.   On: 02/07/2020 11:57   DG Hip Unilat W or Wo Pelvis 2-3 Views Right  Result Date: 02/07/2020 CLINICAL DATA:  Right-sided pain after fall 1 week ago. EXAM: DG HIP (WITH OR WITHOUT PELVIS) 2-3V RIGHT COMPARISON:  None. FINDINGS: Nondisplaced right superior and inferior pubic rami fractures are present. The hip is located. No acute femoral fracture is present. IMPRESSION: Nondisplaced right superior and inferior pubic rami fractures. Electronically Signed   By: San Morelle M.D.   On: 02/07/2020 07:42    Microbiology: Recent Results (from the past 240 hour(s))  SARS Coronavirus 2 by RT PCR (hospital order, performed in [facility] hospital lab) Nasopharyngeal Nasopharyngeal Swab     Status: None   Collection Time: 02/07/20  3:57 PM   Specimen: Nasopharyngeal Swab  Result Value Ref Range Status   SARS Coronavirus 2 NEGATIVE NEGATIVE Final    Comment: (NOTE) SARS-CoV-2 target nucleic acids are NOT DETECTED.  The SARS-CoV-2 RNA is generally detectable in upper and lower respiratory specimens during the acute phase of infection. The lowest concentration of SARS-CoV-2 viral copies this assay can detect is 250 copies / mL. A negative result does not preclude SARS-CoV-2 infection and should not be used as the sole basis for treatment or other patient management decisions.  A negative result may occur with improper specimen collection / handling,  submission of specimen other than nasopharyngeal swab, presence of viral mutation(s) within the areas targeted by this assay, and inadequate number of viral copies (<250 copies / mL). A negative result must be combined with clinical observations, patient history, and epidemiological information.  Fact Sheet for Patients:   StrictlyIdeas.no  Fact Sheet for Healthcare Providers: BankingDealers.co.za  This test is not yet approved or  cleared by the [place] FDA and has been authorized for detection and/or diagnosis of SARS-CoV-2 by FDA under an Emergency Use Authorization (EUA).  This EUA will remain in effect (meaning this test can be used) for the duration of the COVID-19 declaration under Section 564(b)(1) of the Act, 21 U.S.C. section 360bbb-3(b)(1), unless the authorization is terminated or revoked sooner.  Performed at [facility], [place] [address], [place], [place] [address]      Labs: Basic Metabolic Panel: Recent Labs  Lab 02/06/20 1819 02/08/20 0430 02/09/20 0339 02/10/20 0351 02/11/20 0428  NA 134* 138 138 137 138  K 4.6 4.6 5.1 4.7 4.9  CL 97* 103 101 101 102  CO2 24 28 27 26 29   GLUCOSE 200* 152* 165* 161* 136*  BUN 26* 18 21 19  20  CREATININE 1.41* 0.98 0.95 0.87 0.92  CALCIUM 9.2 8.8* 8.7* 8.4* 8.6*   Liver Function Tests: No results for input(s): AST, ALT, ALKPHOS, BILITOT, PROT, ALBUMIN in the last 168 hours. No results for input(s): LIPASE, AMYLASE in the last 168 hours. No results for input(s): AMMONIA in the last 168 hours. CBC: Recent Labs  Lab 02/06/20 1819 02/08/20 0430 02/09/20 0339 02/10/20 0740 02/11/20 0428  WBC 15.1* 10.2 11.9* 10.4 10.5  HGB 11.3* 9.1* 9.5* 10.0* 9.2*  HCT 39.4 31.2* 32.7* 34.8* 31.8*  MCV 92.1 92.0 92.1 94.3 91.9  PLT 446* 278 324 264 300   Cardiac Enzymes: No results for input(s): CKTOTAL, CKMB, CKMBINDEX, TROPONINI in the last 168 hours. BNP: BNP (last  3 results) Recent Labs    02/07/20 0620  BNP 171.1*    ProBNP (last 3 results) No results for input(s): PROBNP in the last 8760 hours.  CBG: Recent Labs  Lab 02/11/20 0840 02/11/20 1240 02/11/20 1618 02/11/20 2036 02/12/20 0833  GLUCAP 150* 230* 195* 215* 125*       Signed:  Kayleen Memos, MD [facility] 02/12/2020, 10:59 AM

## 2020-02-12 NOTE — Plan of Care (Signed)

## 2020-02-12 NOTE — Plan of Care (Signed)

## 2020-02-12 NOTE — PMR Pre-admission (Signed)
PMR Admission Coordinator Pre-Admission Assessment  Patient: Crystal Ashley is an 74 y.o., female MRN: [id number] DOB: 13-Mar-1946 Height: 5' (152.4 cm) Weight: 91.2 kg    Insurance Information HMO: PPO: PCP: IPA: 80/20: YES OTHER:  PRIMARY:Medicare A and BPolicy#: [id number]: pt CM Name: Phone#: Fax#:  Pre-Cert#:verified online via passportOneEmployer: n/a Benefits: Phone #: Name:  Eff. Date:01/16/2011 Part A and 02/15/2011 Part  BDeduct: $[id number] of [place] Max: n/aLife Max: n/a CIR:100%SNF: 20 full days Outpatient:80%Co-Pay: 20% Home Health:100%Co-Pay:  DME:80%Co-Pay: 20% Providers:Pt choice SECONDARY: General Commercial      Policy#:[id number]     Phone#: n/a   The "Data Collection Information Summary" for patients in Inpatient Rehabilitation Facilities with attached "Privacy Act [place] Records" was provided and verbally reviewed with: Patient  Emergency Contact Information Contact Information    Name Relation Home Work Mobile   Chenevert,Prashant Son   [phone]      Current Medical History  Patient Admitting Diagnosis: Right Pubic Ramus Fracture History of Present Illness:  Pt. is a 74 y.o.femalewith medical history significant ofhypothyroidism; HTN; afib on Xarelto; chronic combined CHF, DM2, HLD,  and glaucoma presented to ED 02/06/20 with hip pain and hypotension.She reports falling on 01/31/20 (Pt. Came to the ED at that time and x-rays were negative for fx)  and having ongoing back and R hip pain since fall. Pt. seen by Ortho  and  Neurosurgery. 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 Per ortho, Pt. Also has right sup/inf pubic rami fxs and is WBAT.  Neurosurgery recommended thoracic and lumbar MRI for further evaluation as well as TLSO brace when out of bed.  No evidence of  disruption of the posterior tension band on MRI thoracic and lumbar spine per neurosurgery.  She will need to follow-up in neurosurgery clinic in 4 weeks for an upright x-ray.No plans for surgical intervention at this time.    Patient's medical record from [facility] has been reviewed by the rehabilitation admission coordinator and physician.  Past Medical History  Past Medical History:  Diagnosis Date  . Atrial fibrillation ([place])   . Cancer ([place])   . Cataract   . Fracture 2001   left ankle  . Glaucoma   . Hypertension   . Thyroid disease     Family History   family history includes Diabetes in her son; Healthy in her father and mother.  Prior Rehab/Hospitalizations Has the patient had prior rehab or hospitalizations prior to admission? No  Has the patient had major surgery during 100 days prior to admission? No   Current Medications  Current Facility-Administered Medications:  .  acetaminophen (TYLENOL) tablet 325-650 mg, 325-650 mg, Oral, Q4H PRN, Love, Pamela S, PA-C .  alum & mag hydroxide-simeth (MAALOX/MYLANTA) 200-200-20 MG/5ML suspension 30 mL, 30 mL, Oral, Q4H PRN, Love, Pamela S, PA-C .  [START ON 02/13/2020] atorvastatin (LIPITOR) tablet 10 mg, 10 mg, Oral, Daily, Love, Pamela S, PA-C .  bisacodyl (DULCOLAX) suppository 10 mg, 10 mg, Rectal, Daily PRN, Love, Pamela S, PA-C .  [START ON 02/13/2020] diltiazem (CARDIZEM CD) 24 hr capsule 180 mg, 180 mg, Oral, Daily, Love, Pamela S, PA-C .  diphenhydrAMINE (BENADRYL) 12.5 MG/5ML elixir 12.5-25 mg, 12.5-25 mg, Oral, Q6H PRN, Love, Pamela S, PA-C .  dorzolamide-timolol (COSOPT) 22.3-6.8 MG/ML ophthalmic solution 1 drop, 1 drop, Both Eyes, BID, Love, Pamela S, PA-C, 1 drop at 02/12/20 1933 .  [START ON 02/13/2020] furosemide (LASIX)  tablet 20 mg, 20 mg, Oral, Daily, Love, Pamela S, PA-C .  guaiFENesin-dextromethorphan (ROBITUSSIN DM) 100-10 MG/5ML syrup 5-10 mL, 5-10 mL, Oral, Q6H PRN, Love, Pamela S, PA-C .   insulin aspart (novoLOG) injection 0-5 Units, 0-5 Units, Subcutaneous, QHS, Love, Pamela S, PA-C .  insulin aspart (novoLOG) injection 0-9 Units, 0-9 Units, Subcutaneous, TID WC, Love, Pamela S, PA-C .  latanoprost (XALATAN) 0.005 % ophthalmic solution 1 drop, 1 drop, Both Eyes, QHS, Love, Pamela S, PA-C .  [START ON 02/13/2020] levothyroxine (SYNTHROID) tablet 75 mcg, 75 mcg, Oral, QAC breakfast, Love, Pamela S, PA-C .  [START ON 02/13/2020] metoprolol succinate (TOPROL-XL) 24 hr tablet 100 mg, 100 mg, Oral, Daily, Love, Pamela S, PA-C .  oxyCODONE (Oxy IR/ROXICODONE) immediate release tablet 5-10 mg, 5-10 mg, Oral, Q4H PRN, Love, Pamela S, PA-C .  polyethylene glycol (MIRALAX / GLYCOLAX) packet 17 g, 17 g, Oral, Daily PRN, Love, Pamela S, PA-C .  polyethylene glycol (MIRALAX / GLYCOLAX) packet 17 g, 17 g, Oral, Daily PRN, Love, Pamela S, PA-C .  prochlorperazine (COMPAZINE) tablet 5-10 mg, 5-10 mg, Oral, Q6H PRN **OR** prochlorperazine (COMPAZINE) injection 5-10 mg, 5-10 mg, Intramuscular, Q6H PRN **OR** prochlorperazine (COMPAZINE) suppository 12.5 mg, 12.5 mg, Rectal, Q6H PRN, Love, Pamela S, PA-C .  [START ON 02/13/2020] Rivaroxaban (XARELTO) tablet 15 mg, 15 mg, Oral, Q supper, Love, Pamela S, PA-C .  senna-docusate (Senokot-S) tablet 2 tablet, 2 tablet, Oral, BID, Love, Pamela S, PA-C, 2 tablet at 02/12/20 1933 .  sodium phosphate (FLEET) 7-19 GM/118ML enema 1 enema, 1 enema, Rectal, Once PRN, Love, Pamela S, PA-C .  traMADol (ULTRAM) tablet 50 mg, 50 mg, Oral, Q6H PRN, Love, Pamela S, PA-C .  zolpidem (AMBIEN) tablet 5 mg, 5 mg, Oral, QHS PRN, Love, Pamela S, PA-C  Patients Current Diet:  Diet Order            Diet heart healthy/carb modified Room service appropriate? Yes; Fluid consistency: Thin  Diet effective now                 Precautions / Restrictions Restrictions Weight Bearing Restrictions: No   Has the patient had 2 or more falls or a fall with injury in the past year?  Yes  Prior Activity Level    Prior Functional Level Self Care: Did the patient need help bathing, dressing, using the toilet or eating? Needed some help  Indoor Mobility: Did the patient need assistance with walking from room to room (with or without device)? Needed some help  Stairs: Did the patient need assistance with internal or external stairs (with or without device)? Needed some help  Functional Cognition: Did the patient need help planning regular tasks such as shopping or remembering to take medications? Needed some help  Home Assistive Devices / [place] Devices/Equipment: None  Prior Device Use: Indicate devices/aids used by the patient prior to current illness, exacerbation or injury? Walker  Current Functional Level Cognition  Orientation Level: Oriented X4    Extremity Assessment (includes Sensation/Coordination)          ADLs       Mobility       Transfers       Ambulation / Gait / Stairs / Office manager / Balance      Special needs/care consideration  Diabetic management: Pt with DM2 on Novolog.  and Designated visitor: Adaley Kiene (son), Neelan Miscra   Previous Home Environment (from acute therapy  documentation) Living Arrangements: Children Home Care Services: No  Discharge Living Setting Plans for Discharge Living Setting: Lives with (comment) (Son and his girlfriend). Pt.'s son confirmed that he and his girlfriend can provide 24/7 support following discharge and can provide min-mod assistance.  Type of Home at Discharge: House Discharge Home Layout: Able to live on main level with bedroom/bathroom Discharge Home Access: Stairs to enter Entrance Stairs-Rails: Right Entrance Stairs-Number of Steps: 3 Discharge Bathroom Shower/Tub: Walk-in shower Discharge Bathroom Toilet: Handicapped height Discharge Bathroom Accessibility: Yes Does the patient have any problems obtaining your medications?:  No  Social/Family/Support Systems    Goals    Decrease burden of Care through IP rehab admission: Specialzed equipment needs, Decrease number of caregivers, Bowel and bladder program and Patient/family education  Possible need for SNF placement upon discharge: Not anticipated  Patient Condition: I have reviewed medical records from [facility], spoken with CM, and patient and son. I met with patient at the bedside and discussed via phone for inpatient rehabilitation assessment.  Patient will benefit from ongoing PT and OT, can actively participate in 3 hours of therapy a day 5 days of the week, and can make measurable gains during the admission.  Patient will also benefit from the coordinated team approach during an Inpatient Acute Rehabilitation admission.  The patient will receive intensive therapy as well as Rehabilitation physician, nursing, social worker, and care management interventions.  Due to bladder management, safety, skin/wound care, disease management, medication administration, pain management and patient education the patient requires 24 hour a day rehabilitation nursing.  The patient is currently min assist +2 to mod assist +2 f with mobility and basic ADLs.  Discharge setting and therapy post discharge at home with home health is anticipated.  Patient has agreed to participate in the Acute Inpatient Rehabilitation Program and will admit today.  Preadmission Screen Completed By:  [name], 02/12/2020 7:40 PM ______________________________________________________________________   Discussed status with Dr. Ranell Patrick on 02/12/2020 at 11:00 AM and received approval for admission today.  Admission Coordinator:  [name], MD, time 1130/Date 02/12/2020  Assessment/Plan: Diagnosis: Closed fracture of right pubic ramus 1. Does the need for close, 24 hr/day Medical supervision in concert with the patient's rehab needs make it unreasonable for this patient  to be served in a less intensive setting? Yes 2. Co-Morbidities requiring supervision/potential complications: atrial fibrillation, HTN, type 2 diabetes mellitus, hypothyroidism, morbid obesity with BMI 39.7 3. Due to bladder management, bowel management, safety, skin/wound care, disease management, medication administration, pain management and patient education, does the patient require 24 hr/day rehab nursing? Yes 4. Does the patient require coordinated care of a physician, rehab nurse, PT, OT, and SLP to address physical and functional deficits in the context of the above medical diagnosis(es)? Yes Addressing deficits in the following areas: balance, endurance, locomotion, strength, transferring, bowel/bladder control, bathing, dressing, feeding, grooming, toileting and psychosocial support 5. Can the patient actively participate in an intensive therapy program of at least 3 hrs of therapy 5 days a week? Yes 6. The potential for patient to make measurable gains while on inpatient rehab is excellent 7. Anticipated functional outcomes upon discharge from inpatient rehab: modified independent PT, modified independent OT, independent SLP 8. Estimated rehab length of stay to reach the above functional goals is: 10-14 days 9. Anticipated discharge destination: Home 10. Overall Rehab/Functional Prognosis: excellent   MD Signature: Leeroy Cha

## 2020-02-12 NOTE — Progress Notes (Signed)
Inpatient Rehab Admissions Coordinator:   I have a CIR bed available for patient today. Patient and son made aware. Plan to admit this afternoon.   Clemens Catholic, [place], [place] Admissions Coordinator  [phone] ([place]) [phone] (office)

## 2020-02-12 NOTE — Discharge Instructions (Signed)
Thoracic Spine Fracture A thoracic spine fracture is a break in one of the bones of the middle part of the back. The fracture can be mild or very bad. The most serious types cause the broken bones to:  Move out of place (unstable).  Damage or press on the main nerve in the spine (spinal cord). In some cases, the bone that connects to the lower part of the back may also have a break (thoracolumbar fracture). What are the causes? This condition may be caused by:  A car accident.  A fall.  A sports accident.  Violent acts. These include assaults or gunshots. What are the signs or symptoms? Symptoms may include:  Back pain.  Trouble standing or walking.  Numbness.  Tingling.  Weakness.  Loss of movement.  Being unable to control when to pee or poop (incontinence). How is this treated? Treatment may include:  Medicines.  A cast or a brace.  Physical therapy.  Surgery. This may be needed for very bad fractures. Follow these instructions at home: Medicines  Take medicines only as told by your doctor.  Do not drive or use heavy machinery while taking pain medicine.  To prevent or treat trouble pooping (constipation) while you are taking prescription pain medicine, your doctor may recommend that you: ? Drink enough fluid to keep your pee (urine) pale yellow. ? Take over-the-counter or prescription medicines. ? Eat foods that are high in fiber. This includes fresh fruits and vegetables, whole grains, and beans. ? Limit foods that are high in fat and processed sugars. This includes fried or sweet foods. If you have a brace:  Wear the back brace as told by your doctor. Remove it only as told by your doctor.  Keep the brace clean.  If the brace is not waterproof: ? Do not let it get wet. ? Cover it with a watertight covering when you take a bath or a shower. Activity  Stay in bed (on bed rest) only as told by your doctor.  Ask your doctor what is safe for you to  do.  Return to your normal activities as told by your doctor.  Do back exercises (physical therapy) as told by your doctor.  Exercise often as told by your doctor. Managing pain, stiffness, and swelling   If told, put ice on the injured area: ? Put ice in a plastic bag. ? Place a towel between your skin and the bag. ? Leave the ice on for 20 minutes, 2-3 times a day. General instructions  Do not use any products that contain nicotine or tobacco, such as cigarettes and e-cigarettes. If you need help quitting, ask your doctor.  Do not drink alcohol.  Keep all follow-up visits as told by your doctor. This is important. Contact a doctor if:  You have a fever.  You have a cough that makes your pain worse.  Your pain medicine is not helping.  Your pain does not get better over time.  You cannot return to your normal activities as planned. Get help right away if:  Your pain is bad and it suddenly gets worse.  You are not able to move any part of your body (paralysis) that is below the level of your injury.  You have numbness, tingling, or weakness in any part of your body that is below the level of your injury.  You cannot control when you pee (urinate) or when you poop (pass stool). Summary  A thoracic spine fracture is a break  in one of the bones of the middle part of the back.  A stable fracture can be treated with a back brace, activity restrictions, pain medicine, and physical therapy. A more severe fracture may require surgery.  Make sure you know what symptoms should cause you to get help right away. This information is not intended to replace advice given to you by your health care provider. Make sure you discuss any questions you have with your health care provider. Document Revised: 09/17/2017 Document Reviewed: 09/17/2017 Elsevier Patient Education  Icard Prevention in the Home, Adult Falls can cause injuries. They can happen to people of all  ages. There are many things you can do to make your home safe and to help prevent falls. Ask for help when making these changes, if needed. What actions can I take to prevent falls? General Instructions  Use good lighting in all rooms. Replace any light bulbs that burn out.  Turn on the lights when you go into a dark area. Use night-lights.  Keep items that you use often in easy-to-reach places. Lower the shelves around your home if necessary.  Set up your furniture so you have a clear path. Avoid moving your furniture around.  Do not have throw rugs and other things on the floor that can make you trip.  Avoid walking on wet floors.  If any of your floors are uneven, fix them.  Add color or contrast paint or tape to clearly mark and help you see: ? Any grab bars or handrails. ? First and last steps of stairways. ? Where the edge of each step is.  If you use a stepladder: ? Make sure that it is fully opened. Do not climb a closed stepladder. ? Make sure that both sides of the stepladder are locked into place. ? Ask someone to hold the stepladder for you while you use it.  If there are any pets around you, be aware of where they are. What can I do in the bathroom?      Keep the floor dry. Clean up any water that spills onto the floor as soon as it happens.  Remove soap buildup in the tub or shower regularly.  Use non-skid mats or decals on the floor of the tub or shower.  Attach bath mats securely with double-sided, non-slip rug tape.  If you need to sit down in the shower, use a plastic, non-slip stool.  Install grab bars by the toilet and in the tub and shower. Do not use towel bars as grab bars. What can I do in the bedroom?  Make sure that you have a light by your bed that is easy to reach.  Do not use any sheets or blankets that are too big for your bed. They should not hang down onto the floor.  Have a firm chair that has side arms. You can use this for support  while you get dressed. What can I do in the kitchen?  Clean up any spills right away.  If you need to reach something above you, use a strong step stool that has a grab bar.  Keep electrical cords out of the way.  Do not use floor polish or wax that makes floors slippery. If you must use wax, use non-skid floor wax. What can I do with my stairs?  Do not leave any items on the stairs.  Make sure that you have a light switch at the top of the stairs and  the bottom of the stairs. If you do not have them, ask someone to add them for you.  Make sure that there are handrails on both sides of the stairs, and use them. Fix handrails that are broken or loose. Make sure that handrails are as long as the stairways.  Install non-slip stair treads on all stairs in your home.  Avoid having throw rugs at the top or bottom of the stairs. If you do have throw rugs, attach them to the floor with carpet tape.  Choose a carpet that does not hide the edge of the steps on the stairway.  Check any carpeting to make sure that it is firmly attached to the stairs. Fix any carpet that is loose or worn. What can I do on the outside of my home?  Use bright outdoor lighting.  Regularly fix the edges of walkways and driveways and fix any cracks.  Remove anything that might make you trip as you walk through a door, such as a raised step or threshold.  Trim any bushes or trees on the path to your home.  Regularly check to see if handrails are loose or broken. Make sure that both sides of any steps have handrails.  Install guardrails along the edges of any raised decks and porches.  Clear walking paths of anything that might make someone trip, such as tools or rocks.  Have any leaves, snow, or ice cleared regularly.  Use sand or salt on walking paths during winter.  Clean up any spills in your garage right away. This includes grease or oil spills. What other actions can I take?  Wear shoes  that: ? Have a low heel. Do not wear high heels. ? Have rubber bottoms. ? Are comfortable and fit you well. ? Are closed at the toe. Do not wear open-toe sandals.  Use tools that help you move around (mobility aids) if they are needed. These include: ? Canes. ? Walkers. ? Scooters. ? Crutches.  Review your medicines with your doctor. Some medicines can make you feel dizzy. This can increase your chance of falling. Ask your doctor what other things you can do to help prevent falls. Where to find more information  Centers for Disease Control and Prevention, STEADI: https://garcia.biz/  [employer] on Aging: BrainJudge.co.uk Contact a doctor if:  You are afraid of falling at home.  You feel weak, drowsy, or dizzy at home.  You fall at home. Summary  There are many simple things that you can do to make your home safe and to help prevent falls.  Ways to make your home safe include removing tripping hazards and installing grab bars in the bathroom.  Ask for help when making these changes in your home. This information is not intended to replace advice given to you by your health care provider. Make sure you discuss any questions you have with your health care provider. Document Revised: 11/24/2018 Document Reviewed: 03/18/2017 Elsevier Patient Education  2020 [employer].

## 2020-02-12 NOTE — Care Management Important Message (Signed)
Important Message  Patient Details  Name: Crystal Ashley MRN: [id number] Date of Birth: 1946/07/27   Medicare Important Message Given:  Yes     Orbie Pyo 02/12/2020, 3:58 PM

## 2020-02-13 ENCOUNTER — Inpatient Hospital Stay (HOSPITAL_COMMUNITY): Payer: Medicare Other | Admitting: Occupational Therapy

## 2020-02-13 ENCOUNTER — Inpatient Hospital Stay (HOSPITAL_COMMUNITY): Payer: Medicare Other | Admitting: Physical Therapy

## 2020-02-13 LAB — CBC WITH DIFFERENTIAL/PLATELET
Abs Immature Granulocytes: 0.1 10*3/uL — ABNORMAL HIGH (ref 0.00–0.07)
Basophils Absolute: 0 10*3/uL (ref 0.0–0.1)
Basophils Relative: 0 %
Eosinophils Absolute: 0.3 10*3/uL (ref 0.0–0.5)
Eosinophils Relative: 3 %
HCT: 32 % — ABNORMAL LOW (ref 36.0–46.0)
Hemoglobin: 9.5 g/dL — ABNORMAL LOW (ref 12.0–15.0)
Immature Granulocytes: 1 %
Lymphocytes Relative: 24 %
Lymphs Abs: 2.5 10*3/uL (ref 0.7–4.0)
MCH: 27.1 pg (ref 26.0–34.0)
MCHC: 29.7 g/dL — ABNORMAL LOW (ref 30.0–36.0)
MCV: 91.2 fL (ref 80.0–100.0)
Monocytes Absolute: 0.6 10*3/uL (ref 0.1–1.0)
Monocytes Relative: 5 %
Neutro Abs: 7.1 10*3/uL (ref 1.7–7.7)
Neutrophils Relative %: 67 %
Platelets: 298 10*3/uL (ref 150–400)
RBC: 3.51 MIL/uL — ABNORMAL LOW (ref 3.87–5.11)
RDW: 17.6 % — ABNORMAL HIGH (ref 11.5–15.5)
WBC: 10.5 10*3/uL (ref 4.0–10.5)
nRBC: 0 % (ref 0.0–0.2)

## 2020-02-13 LAB — COMPREHENSIVE METABOLIC PANEL
ALT: 12 U/L (ref 0–44)
AST: 11 U/L — ABNORMAL LOW (ref 15–41)
Albumin: 2.7 g/dL — ABNORMAL LOW (ref 3.5–5.0)
Alkaline Phosphatase: 122 U/L (ref 38–126)
Anion gap: 10 (ref 5–15)
BUN: 17 mg/dL (ref 8–23)
CO2: 28 mmol/L (ref 22–32)
Calcium: 8.8 mg/dL — ABNORMAL LOW (ref 8.9–10.3)
Chloride: 101 mmol/L (ref 98–111)
Creatinine, Ser: 0.9 mg/dL (ref 0.44–1.00)
GFR calc Af Amer: >60 mL/min (ref >60)
GFR calc non Af Amer: >60 mL/min (ref >60)
Glucose, Bld: 160 mg/dL — ABNORMAL HIGH (ref 70–99)
Potassium: 4.1 mmol/L (ref 3.5–5.1)
Sodium: 139 mmol/L (ref 135–145)
Total Bilirubin: 0.5 mg/dL (ref 0.3–1.2)
Total Protein: 5.7 g/dL — ABNORMAL LOW (ref 6.5–8.1)

## 2020-02-13 LAB — GLUCOSE, CAPILLARY
Glucose-Capillary: 159 mg/dL — ABNORMAL HIGH (ref 70–99)
Glucose-Capillary: 168 mg/dL — ABNORMAL HIGH (ref 70–99)
Glucose-Capillary: 183 mg/dL — ABNORMAL HIGH (ref 70–99)
Glucose-Capillary: 155 mg/dL — ABNORMAL HIGH (ref 70–99)

## 2020-02-13 LAB — VITAMIN D 25 HYDROXY (VIT D DEFICIENCY, FRACTURES): Vit D, 25-Hydroxy: 51.67 ng/mL (ref 30–100)

## 2020-02-13 LAB — VITAMIN B12: Vitamin B-12: 987 pg/mL — ABNORMAL HIGH (ref 180–914)

## 2020-02-13 MED ORDER — MANAGING BACK PAIN BOOK
Freq: Once | Status: AC
  Filled 2020-02-13: qty 1

## 2020-02-13 MED ORDER — LIVING WELL WITH DIABETES BOOK
Freq: Once | Status: AC
  Filled 2020-02-13: qty 1

## 2020-02-13 NOTE — Care Management (Signed)
Patient ID: Crystal Ashley, female   DOB: 08/06/1946, 74 y.o.   MRN: [id number] Met with the patient to review role of CM and collaboration with SW ([name]) to facilitate preparation for discharge. Reviewed risks for complications of injury including constipation and urinary incontinence (new). Reviewed management of back pain, dehydration and carbohydrate counting for DM control. Patient noted to be sedentary for the past 6 months and does not follow a CMM diet. Continue to follow along with the case and address nursing issues noted.  

## 2020-02-13 NOTE — Evaluation (Signed)
Physical Therapy Assessment and Plan  Patient Details  Name: Crystal Ashley MRN: [id number] Date of Birth: [birth year]  PT Diagnosis: Abnormal posture, Abnormality of gait, Difficulty walking, Edema, Muscle weakness and Pain in back Rehab Potential: Good ELOS: 7-10days   Today's Date: 02/13/2020 PT Individual Time: 2637-8588 PT Individual Time Calculation (min): 59 min    Hospital Problem: Principal Problem:   Multiple traumatic injuries   Past Medical History:  Past Medical History:  Diagnosis Date  . Atrial fibrillation ([place])   . Cancer ([place])   . Cataract   . Fracture 2001   left ankle  . Glaucoma   . Hypertension   . Thyroid disease    Past Surgical History:  Past Surgical History:  Procedure Laterality Date  . CARDIOVERSION N/A 04/08/2018   Procedure: CARDIOVERSION;  Surgeon: Adrian Prows, MD;  Location: [facility] ENDOSCOPY;  Service: Cardiovascular;  Laterality: N/A;  . CARDIOVERSION N/A 05/22/2019   Procedure: CARDIOVERSION;  Surgeon: Adrian Prows, MD;  Location: [facility];  Service: Cardiovascular;  Laterality: N/A;  . FOOT SURGERY Left   . HYSTEROSCOPY     POLYPECTOMY    Assessment & Plan Clinical Impression: Patient is a 74 y.o. year old female with history of T2DM, CHF, A fib, fall on 01/31/20 who was evaluated in ED and plain films negative. Faall felt to be due to hypotension and she was encouraged to increase fluid intake. She has had problems ambulating with mid back pain, inability to perform ADLs and limited mobility due to pain. She was admitted for work up and found to be hypotensive with AKI. Renal ultrasound showed bilateral renal cortical atrophy without mass or hydronephrosis. Dr. Hollie Salk consulted and felt that patient with acute on chronic renal failure and recommended IVF for gentle hydration and holding Lasix/ARB for now. She was found to have non-displaced superior/inferior pubic rami fractures. Dr. Marlou Sa consulted and recommended WBAT.    Family requested  CT back for work up due to pain. CT lumbar, thoracic and cervical spine done revealing acute T10 and T 11 fracture in setting of diffuse thoracic ankylosis, chronic L1 compression fracture with 45% loss of height. Patient without neurologic symptoms and no B/B issues.  Dr. Marcello Moores consulted for input and MRI lumbar/thoracic spine done showing acute T10 and T11 vertebral body fractures with 25% loss of height at T11 without retropulsion or cord compression. He recommended TLSO to be worn when out of bed X 3 months--no surgery indicated and treatment of osteoporosis. To follow up in 4 weeks with upright films.  AKI improved with hydration nd lasix resumed today. ABLA being monitored. Therapy ongoing and patient limited by pain and weakness. CIR recommended due to functional decline. Patient transferred to CIR on 02/12/2020 .   Patient currently requires mod with mobility secondary to muscle weakness, decreased cardiorespiratoy endurance, unbalanced muscle activation and decreased sitting balance, decreased standing balance, decreased postural control, decreased balance strategies and difficulty maintaining precautions.  Prior to hospitalization, patient was independent  with mobility and lived with Spouse, Other (Comment) (son and his fiance) in a House home.  Home access is 3Stairs to enter.  Patient will benefit from skilled PT intervention to maximize safe functional mobility, minimize fall risk and decrease caregiver burden for planned discharge home with 24 hour supervision.  Anticipate patient will benefit from follow up [place] at discharge.  PT - End of Session Activity Tolerance: Tolerates 30+ min activity with multiple rests Endurance Deficit: Yes Endurance Deficit Description: requires frequent seated rest breaks  PT Assessment Rehab Potential (ACUTE/IP ONLY): Good PT Barriers to Discharge: Inaccessible home environment;Weight;Home environment access/layout PT Patient demonstrates impairments in the  following area(s): Balance;Behavior;Safety;Edema;Sensory;Endurance;Skin Integrity;Motor;Nutrition;Pain PT Transfers Functional Problem(s): Bed Mobility;Bed to Chair;Car;Furniture PT Locomotion Functional Problem(s): Ambulation;Stairs PT Plan PT Intensity: Minimum of 1-2 x/day ,45 to 90 minutes PT Frequency: 5 out of 7 days PT Duration Estimated Length of Stay: 7-10days PT Treatment/Interventions: Ambulation/gait training;Community reintegration;DME/adaptive equipment instruction;Neuromuscular re-education;Stair training;UE/LE Strength taining/ROM;Psychosocial support;Balance/vestibular training;Discharge planning;Functional electrical stimulation;Pain management;Skin care/wound management;Therapeutic Activities;UE/LE Coordination activities;Cognitive remediation/compensation;Disease management/prevention;Functional mobility training;Patient/family education;Splinting/orthotics;Therapeutic Exercise;Visual/perceptual remediation/compensation PT Transfers Anticipated Outcome(s): supervision PT Locomotion Anticipated Outcome(s): supervision PT Recommendation Follow Up Recommendations: Home health PT;24 hour supervision/assistance Patient destination: Home Equipment Recommended: Other (comment) Equipment Details: RW - pt has standard walker and w/c  Skilled Therapeutic Intervention Evaluation completed (see details above and below) with education on PT POC and goals and individual treatment initiated with focus on bed mobility, functional transfers, ambulation, stair navigation, and activity tolerance as well as pt/family education regarding daily therapy schedule, weekly team meetings, purpose of PT evaluation, and other CIR information. Pt received supine in bed with her son present and pt agreeable to therapy session. Therapist educating pt on wearing TLSO when OOB, donned in sitting. Supine>sitting L EOB with mod assist for pivoting hips and bringing trunk upright - cuing for logroll technique.   Donned TLSO total assist while pt sitting EOB using B UE support for trunk support and comfort. Pt noted to be wheezing with mobility - cuing throughout to breath during movement. R stand pivot to w/c using RW with min assist for lifting and balance - pt noted to have delayed B knee extension remaining in slight squat posture when first standing.  Transported to/from gym in w/c for time management and energy conservation. Gait training 46f using RW with min assist for balance and AD management - pt pushes RW too far forward, shuffled gait pattern with decreased speed and guarded movements due to pain/discomfort - SpO2 99% on RA after walk. Pt reports pain level 6/10 with increased pain after mobility. Simulated ambulatory car transfer (sedan height) without AD with pt requiring mod assist to walk ~554fand mod assist for placing B LEs in/out of vehicle. Ascended/descended 3 (3"height) steps using B HRs, pt did not feel safe using only R HR like at home and pt reports height of her steps are 3", with min assist - ascended forwards with step-to pattern and descended backwards with step-to pattern. Pt reporting fatigue. Transported back to room in w/c and [place] in [place] of [place] PT Evaluation Precautions/Restrictions Precautions Precautions: Back;Fall Required Braces or Orthoses: Spinal Brace Spinal Brace: Thoracolumbosacral orthotic;Applied in sitting position;Other (comment) Spinal Brace Comments: TLSO when OOB Restrictions Weight Bearing Restrictions: Yes RLE Weight Bearing: Weight bearing as tolerated LLE Weight Bearing: Weight bearing as tolerated Pain Pain Assessment Pain Scale: 0-10 Pain Score: 6  Pain Type: Acute pain Pain Location: Back Pain Orientation: Lower Pain Descriptors / Indicators: Aching Pain Onset: On-going Pain Intervention(s): RN made aware;Emotional support;Repositioned;Relaxation;Rest;Distraction Home Living/Prior Functioning Home Living Available Help at Discharge:  Family;Available 24 hours/day (son works during day but his fiance is there (-2 hours) and then she goes to night classess and the son comes back home) Type of Home: House Home Access: Stairs to enter [employer] Steps: 3 Entrance Stairs-Rails: Right Home Layout: Two level;Able to live on main level with bedroom/bathroom Additional Comments: has standard walker; has w/c  Lives With: Spouse;Other (Comment) (son and  his fiance) Prior Function Level of Independence: Independent with transfers;Independent with homemaking with ambulation;Independent with gait (before the fall (01/31/20) she didn't use AD but then after the fall she required +2 assist for sit<>stand and ambulating with RW)  Able to Take Stairs?: Yes Driving: No Comments: prior to fall she didn't walk much but she was independent with bed mobility, functional transfers, and short household distance ambulation - reports post-shower she was fatigued and required some assistance to finish getting ready for the day Perception  Perception Perception: Within Functional Limits Praxis Praxis: Intact  Cognition Overall Cognitive Status: Within Functional Limits for tasks assessed Arousal/Alertness: Awake/alert Orientation Level: Oriented X4 Attention: Focused;Sustained Focused Attention: Appears intact Sustained Attention: Appears intact Safety/Judgment: Appears intact Sensation Sensation Light Touch: Appears Intact Hot/Cold: Not tested Proprioception: Appears Intact Stereognosis: Not tested Coordination Gross Motor Movements are Fluid and Coordinated: No Coordination and Movement Description: impaired due to generalized weakness and pain Motor  Motor Motor: Other (comment) Motor - Skilled Clinical Observations: generalized weakness  Mobility Bed Mobility Bed Mobility: Supine to Sit;Sit to Supine Supine to Sit: Moderate Assistance - Patient 50-74% Sit to Supine: Moderate Assistance - Patient  50-74% Transfers Transfers: Sit to Stand;Stand to Sit;Stand Pivot Transfers Sit to Stand: Minimal Assistance - Patient > 75% Stand to Sit: Minimal Assistance - Patient > 75% Stand Pivot Transfers: Minimal Assistance - Patient > 75% Stand Pivot Transfer Details: Tactile cues for sequencing;Tactile cues for weight shifting;Tactile cues for initiation;Verbal cues for safe use of DME/AE;Verbal cues for technique;Verbal cues for precautions/safety Transfer (Assistive device): Rolling walker Locomotion  Gait Ambulation: Yes Gait Assistance: Minimal Assistance - Patient > 75% Gait Distance (Feet): 77 Feet Assistive device: Rolling walker Gait Assistance Details: Verbal cues for safe use of DME/AE;Verbal cues for gait pattern;Tactile cues for sequencing;Verbal cues for precautions/safety Gait Gait: Yes Gait Pattern: Impaired Gait Pattern: Decreased step length - left;Decreased step length - right;Poor foot clearance - left;Poor foot clearance - right Gait velocity: slow guarded gait pattern Stairs / Additional Locomotion Stairs: Yes Stairs Assistance: Minimal Assistance - Patient > 75% Stair Management Technique: Two rails Number of Stairs: 3 Height of Stairs: 3 Wheelchair Mobility Wheelchair Mobility: No  Trunk/Postural Assessment  Cervical Assessment Cervical Assessment: Exceptions to [facility] (forward head) Thoracic Assessment Thoracic Assessment: Exceptions to [facility] (TLSO donned) Lumbar Assessment Lumbar Assessment: Exceptions to WFL (TLSO donned) Postural Control Postural Control: Deficits on evaluation Postural Limitations: decreased with pt relying on B UE support  Balance Balance Balance Assessed: Yes Static Sitting Balance Static Sitting - Balance Support: Feet supported Static Sitting - Level of Assistance: 5: Stand by assistance Dynamic Sitting Balance Dynamic Sitting - Balance Support: Feet supported Dynamic Sitting - Level of Assistance: 4: Min assist Static Standing  Balance Static Standing - Balance Support: During functional activity;Bilateral upper extremity supported Static Standing - Level of Assistance: 4: Min assist Dynamic Standing Balance Dynamic Standing - Balance Support: During functional activity;Bilateral upper extremity supported Dynamic Standing - Level of Assistance: 4: Min assist;3: Mod assist Extremity Assessment      RLE Assessment RLE Assessment: Exceptions to [facility] Passive Range of Motion (PROM) Comments: pain with supine hip flexion Active Range of Motion (AROM) Comments: WFL for tasks assessed General Strength Comments: pt with difficulty following commands for formal strenth assessment but demonstrates grossly 3+/5 functionally LLE Assessment LLE Assessment: Exceptions to [facility] Active Range of Motion (AROM) Comments: WFL for tasks assessed General Strength Comments: pt with difficulty following commands for formal strenth assessment but demonstrates  grossly 3+/5 functionally    Refer to Care Plan for Long Term Goals  Recommendations for other services: None   Discharge Criteria: Patient will be discharged from PT if patient refuses treatment 3 consecutive times without medical reason, if treatment goals not met, if there is a change in medical status, if patient makes no progress towards goals or if patient is discharged from hospital.  The above assessment, treatment plan, treatment alternatives and goals were discussed and mutually agreed upon: by patient and by family  Tawana Scale, PT, DPT  02/13/2020, 8:00 AM

## 2020-02-13 NOTE — Progress Notes (Signed)
[facility] Individual Statement of Services  Patient Name:  Crystal Ashley  Date:  02/13/2020  Welcome to the [facility].  Our goal is to provide you with an individualized program based on your diagnosis and situation, designed to meet your specific needs.  With this comprehensive rehabilitation program, you will be expected to participate in at least 3 hours of rehabilitation therapies Monday-Friday, with modified therapy programming on the weekends.  Your rehabilitation program will include the following services:  Physical Therapy (PT), Occupational Therapy (OT), Speech Therapy (ST), 24 hour per day rehabilitation nursing, Therapeutic Recreaction (TR), Neuropsychology, Care Coordinator, Rehabilitation Medicine, Nutrition Services, Pharmacy Services and Other  Weekly team conferences will be held on Wednesdays to discuss your progress.  Your Inpatient Rehabilitation Care Coordinator will talk with you frequently to get your input and to update you on team discussions.  Team conferences with you and your family in attendance may also be held.  Expected length of stay: 10-14 Days  Overall anticipated outcome: MOD I  Depending on your progress and recovery, your program may change. Your Inpatient Rehabilitation Care Coordinator will coordinate services and will keep you informed of any changes. Your Inpatient Rehabilitation Care Coordinator's name and contact numbers are listed  below.  The following services may also be recommended but are not provided by the [facility]:    [place] will be made to provide these services after discharge if needed.  Arrangements include referral to agencies that provide these services.  Your insurance has been verified to be:  Medicare Your primary doctor is:  [name], [place]  Pertinent information will be  shared with your doctor and your insurance company.  Inpatient Rehabilitation Care Coordinator:  Erlene Quan, [place] or [phone]  Information discussed with and copy given to patient by: Dyanne Iha, 02/13/2020, 10:19 AM

## 2020-02-13 NOTE — Evaluation (Signed)
Occupational Therapy Assessment and Plan  Patient Details  Name: Crystal Ashley MRN: [id number] Date of Birth: 11/22/1945  OT Diagnosis: abnormal posture, lumbago (low back pain) and muscle weakness (generalized) Rehab Potential: Rehab Potential (ACUTE ONLY): Good ELOS: 7-10 days   Today's Date: 02/13/2020 OT Individual Time: 0900-1000  &  1300-1310 OT Individual Time Calculation (min): 60 min  & 10 min (missed 50 minutes due to fatigue)   Hospital Problem: Principal Problem:   Multiple traumatic injuries   Past Medical History:  Past Medical History:  Diagnosis Date  . Atrial fibrillation ([place])   . Cancer ([place])   . Cataract   . Fracture 2001   left ankle  . Glaucoma   . Hypertension   . Thyroid disease    Past Surgical History:  Past Surgical History:  Procedure Laterality Date  . CARDIOVERSION N/A 04/08/2018   Procedure: CARDIOVERSION;  Surgeon: Adrian Prows, MD;  Location: [facility] ENDOSCOPY;  Service: Cardiovascular;  Laterality: N/A;  . CARDIOVERSION N/A 05/22/2019   Procedure: CARDIOVERSION;  Surgeon: Adrian Prows, MD;  Location: [place] [facility];  Service: Cardiovascular;  Laterality: N/A;  . FOOT SURGERY Left   . HYSTEROSCOPY     POLYPECTOMY    Assessment & Plan Clinical Impression: Patient is a 74 y.o. year old female with history of T2DM, CHF, A fib, fall on 01/31/20 who was evaluated in ED and plain films negative. [place]all felt to be due to hypotension and she was encouraged to increase fluid intake. She has had problems ambulating with mid back pain, inability to perform ADLs and limited mobility due to pain. She was admitted for work up and found to be hypotensive with AKI. Renal ultrasound showed bilateral renal cortical atrophy without mass or hydronephrosis. Dr. Hollie Salk consulted and felt that patient with acute on chronic renal failure and recommended IVF for gentle hydration and holding Lasix/ARB for now. She was found to have non-displaced superior/inferior pubic rami  fractures. Dr. Marlou Sa consulted and recommended WBAT.    Family requested CT back for work up due to pain. CT lumbar, thoracic and cervical spine done revealing acute T10 and T 11 fracture in setting of diffuse thoracic ankylosis, chronic L1 compression fracture with 45% loss of height. Patient without neurologic symptoms and no B/B issues.  Dr. Marcello Moores consulted for input and MRI lumbar/thoracic spine done showing acute T10 and T11 vertebral body fractures with 25% loss of height at T11 without retropulsion or cord compression. He recommended TLSO to be worn when out of bed X 3 months--no surgery indicated and treatment of osteoporosis. To follow up in 4 weeks with upright films.  AKI improved with hydration nd lasix resumed today. ABLA being monitored.   Patient transferred to CIR on 02/12/2020 .    Patient currently requires mod with basic self-care skills secondary to muscle weakness, decreased cardiorespiratoy endurance and decreased sitting balance and decreased standing balance.  Prior to hospitalization, patient could complete ADL with min.  Patient will benefit from skilled intervention to decrease level of assist with basic self-care skills and increase independence with basic self-care skills prior to discharge home with care partner.  Anticipate patient will require minimal physical assistance and follow up home health.  OT - End of Session Activity Tolerance: Tolerates 10 - 20 min activity with multiple rests Endurance Deficit: Yes Endurance Deficit Description: rest breaks during am session - unable to tolerate PM session OT Assessment Rehab Potential (ACUTE ONLY): Good OT Patient demonstrates impairments in the following area(s): Balance;Endurance;Pain OT  Basic ADL's Functional Problem(s): Grooming;Bathing;Dressing;Toileting OT Transfers Functional Problem(s): Toilet;Tub/Shower OT Plan OT Intensity: Minimum of 1-2 x/day, 45 to 90 minutes OT Frequency: 5 out of 7 days OT  Duration/Estimated Length of Stay: 7-10 days OT Treatment/Interventions: Balance/vestibular training;Self Care/advanced ADL retraining;Therapeutic Exercise;DME/adaptive equipment instruction;Pain management;UE/LE Strength taining/ROM;Patient/family education;Discharge planning;Functional mobility training;Therapeutic Activities OT Self Feeding Anticipated Outcome(s): independent OT Basic Self-Care Anticipated Outcome(s): min a OT Toileting Anticipated Outcome(s): CGA/CS OT Bathroom Transfers Anticipated Outcome(s): CGA OT Recommendation Patient destination: Home Follow Up Recommendations: Home health OT Equipment Recommended: To be determined   Skilled Therapeutic Intervention AM session:   Therapy evaluation completed as documented below.  Patient's son present for session.  She presents with limited mobility due to fracture, pain and generalized weakness limiting her ability to perform self care and functional transfer/ambulation.  Reviewed role of OT, plan of care, schedule for therapy, safety with mobility and goals for therapy.  Patient participated in adl training at bed level requiring max A for all LB care and mod A for UB due to limited reach and back precautions - she will benefit from assistive device review and training.  Rolling in bed with min A.  Reviewed importance of weight shift and change of position.  She remained in bed at close of session, bed alarm set and call bell/tray table in reach.    PM session:   Patient in bed, she states that she is too tired to participate this afternoon.  Offered light activity or grooming tasks but she declined at this time.  She states that she does not have to use the bathroom.  Assisted with repositioning/weight shift and provided ice pack for right hip/lower back.  She remained in bed with bed alarm set and call bell in hand - missing 50 minutes of therapy session - will attempt to make up as able.    OT Evaluation Precautions/Restrictions   Precautions Precautions: Back;Fall Required Braces or Orthoses: Spinal Brace Spinal Brace: Thoracolumbosacral orthotic;Applied in sitting position;Other (comment) Spinal Brace Comments: TLSO when OOB Restrictions Weight Bearing Restrictions: Yes RLE Weight Bearing: Weight bearing as tolerated LLE Weight Bearing: Weight bearing as tolerated Pain Pain Assessment Pain Scale: 0-10 Pain Score: 6  Pain Type: Acute pain Pain Location: Back Pain Orientation: Right Pain Descriptors / Indicators: Aching Pain Onset: On-going Pain Intervention(s): Repositioned;Cold applied Home Living/Prior Functioning Home Living Family/patient expects to be discharged to:: Private residence Living Arrangements: Children Available Help at Discharge: Family, Available 24 hours/day Type of Home: House Home Access: Stairs to enter Technical brewer of Steps: 3 Entrance Stairs-Rails: Right Home Layout: Two level, Able to live on main level with bedroom/bathroom Bathroom Shower/Tub: Multimedia programmer: Handicapped height Bathroom Accessibility: Yes Additional Comments: has standard walker; has w/c  Lives With: Family Prior Function Level of Independence: Independent with transfers, Independent with homemaking with ambulation, Independent with gait, Needs assistance with ADLs Bath: Minimal Dressing: Minimal  Able to Take Stairs?: Yes Driving: No Comments: prior to fall she didn't walk much but she was independent with bed mobility, functional transfers, and short household distance ambulation - reports post-shower she was fatigued and required some assistance to finish getting ready for the day ADL ADL Eating: Supervision/safety, Set up Where Assessed-Eating: Bed level Grooming: Other (comment) (declined due to fatigue) Upper Body Bathing: Minimal assistance Where Assessed-Upper Body Bathing: Bed level Lower Body Bathing: Dependent Where Assessed-Lower Body Bathing: Bed level Upper  Body Dressing: Moderate assistance Where Assessed-Upper Body Dressing: Bed level Lower Body Dressing: Dependent  Where Assessed-Lower Body Dressing: Bed level ADL Comments: patient declined OOB for OT eval due to pain and fatigue Vision Baseline Vision/History: Cataracts;Wears glasses Wears Glasses: Reading only Patient Visual Report: No change from baseline Additional Comments: she is able to see objects in environment Perception  Perception: Within Functional Limits Praxis Praxis: Intact Cognition Overall Cognitive Status: Within Functional Limits for tasks assessed Arousal/Alertness: Awake/alert Orientation Level: Person;Place;Situation Person: Oriented Place: Oriented Situation: Oriented Year: 2021 Month: June Day of Week: Incorrect Memory:  (to be further assessed) Immediate Memory Recall: Sock;Blue;Bed Memory Recall Sock: Not able to recall Memory Recall Blue: Without Cue Memory Recall Bed: Not able to recall Attention: Focused;Sustained Focused Attention: Appears intact Sustained Attention: Appears intact Safety/Judgment: Appears intact Sensation Sensation Light Touch: Appears Intact Hot/Cold: Not tested Proprioception: Appears Intact Stereognosis: Not tested Coordination Gross Motor Movements are Fluid and Coordinated: No Fine Motor Movements are Fluid and Coordinated: Yes Coordination and Movement Description: impaired due to generalized weakness and pain Finger Nose Finger Test: [facility] Motor  Motor Motor: Other (comment) Motor - Skilled Clinical Observations: generalized weakness Mobility  Bed Mobility Bed Mobility: Rolling Right;Rolling Left Rolling Right: Minimal Assistance - Patient > 75% Rolling Left: Minimal Assistance - Patient > 75% Supine to Sit: Moderate Assistance - Patient 50-74% Sit to Supine: Moderate Assistance - Patient 50-74% Transfers Sit to Stand: Minimal Assistance - Patient > 75% Stand to Sit: Minimal Assistance - Patient > 75%   Trunk/Postural Assessment  Cervical Assessment Cervical Assessment: Exceptions to [facility] (forward head) Thoracic Assessment Thoracic Assessment: Exceptions to [facility] (TLSO donned) Lumbar Assessment Lumbar Assessment: Exceptions to WFL (TLSO donned) Postural Control Postural Control: Deficits on evaluation Postural Limitations: decreased with pt relying on B UE support  Balance Balance Balance Assessed: Yes Static Sitting Balance Static Sitting - Balance Support: Feet supported Static Sitting - Level of Assistance: 5: Stand by assistance Dynamic Sitting Balance Dynamic Sitting - Balance Support: Feet supported Dynamic Sitting - Level of Assistance: 4: Min assist Static Standing Balance Static Standing - Balance Support: During functional activity;Bilateral upper extremity supported Static Standing - Level of Assistance: 4: Min assist Dynamic Standing Balance Dynamic Standing - Balance Support: During functional activity;Bilateral upper extremity supported Dynamic Standing - Level of Assistance: 4: Min assist;3: Mod assist Extremity/Trunk Assessment RUE Assessment RUE Assessment: Within Functional Limits LUE Assessment LUE Assessment: Within Functional Limits     Refer to Care Plan for Long Term Goals  Recommendations for other services: None    Discharge Criteria: Patient will be discharged from OT if patient refuses treatment 3 consecutive times without medical reason, if treatment goals not met, if there is a change in medical status, if patient makes no progress towards goals or if patient is discharged from hospital.  The above assessment, treatment plan, treatment alternatives and goals were discussed and mutually agreed upon: by patient and by family  Carlos Levering 02/13/2020, 1:31 PM

## 2020-02-13 NOTE — Progress Notes (Signed)
Inpatient Rehabilitation  Patient information reviewed and entered into eRehab system by Danniela Mcbrearty M. Peniel Hass, M.A., CCC/SLP, PPS Coordinator.  Information including medical coding, functional ability and quality indicators will be reviewed and updated through discharge.    

## 2020-02-13 NOTE — Progress Notes (Signed)
[facility] PHYSICAL MEDICINE & REHABILITATION PROGRESS NOTE   Subjective/Complaints: Mrs. Arbogast has no complaints this morning. She is enjoying eating her breakfast.  Her Hgb is 9.5, up from 9.2 Pain is well controlled with oxycodone.   ROS: Denies pain, constipation, insomnia.   Objective:   No results found. Recent Labs    02/11/20 0428 02/13/20 0548  WBC 10.5 10.5  HGB 9.2* 9.5*  HCT 31.8* 32.0*  PLT 300 298   Recent Labs    02/11/20 0428 02/13/20 0548  NA 138 139  K 4.9 4.1  CL 102 101  CO2 29 28  GLUCOSE 136* 160*  BUN 20 17  CREATININE 0.92 0.90  CALCIUM 8.6* 8.8*    Intake/Output Summary (Last 24 hours) at 02/13/2020 1123 Last data filed at 02/13/2020 0500 Gross per 24 hour  Intake --  Output 200 ml  Net -200 ml     Physical Exam: Vital Signs Blood pressure 112/69, pulse 61, temperature 98.7 F (37.1 C), resp. rate 18, height 5' (1.524 m), weight 90.7 kg, SpO2 96 %.  General: Alert and oriented x 3, No apparent distress, sitting up eating breakfast.  HEENT: Head is normocephalic, atraumatic, PERRLA, EOMI, sclera anicteric, oral mucosa pink and moist, dentition intact, ext ear canals clear,  Neck: Supple without JVD or lymphadenopathy Heart: Reg rate and rhythm. No murmurs rubs or gallops Chest: CTA bilaterally without wheezes, rales, or rhonchi; no distress Abdomen: Soft, non-tender, non-distended, bowel sounds positive. Extremities: No clubbing, cyanosis, or edema. Pulses are 2+ Skin: Clean and intact without signs of breakdown Neuro: Pt is cognitively appropriate with normal insight, memory, and awareness. Fine motor coordination is intact. No tremors. Pain with elevation of legs which limits MMT. Upper extremities 5/5 bilaterally. Psych: Pt's affect is appropriate. Pt is cooperative  Assessment/Plan: 1. Functional deficits secondary to multiple fractures following fall, which require 3+ hours per day of interdisciplinary therapy in a  comprehensive inpatient rehab setting.  Physiatrist is providing close team supervision and 24 hour management of active medical problems listed below.  Physiatrist and rehab team continue to assess barriers to discharge/monitor patient progress toward functional and medical goals  Care Tool:  Bathing              Bathing assist       Upper Body Dressing/Undressing Upper body dressing        Upper body assist      Lower Body Dressing/Undressing Lower body dressing      What is the patient wearing?: Incontinence brief     Lower body assist Assist for lower body dressing: Maximal Assistance - Patient 25 - 49%     Toileting Toileting    Toileting assist Assist for toileting: Maximal Assistance - Patient 25 - 49%     Transfers Chair/bed transfer  Transfers assist           Locomotion Ambulation   Ambulation assist              Walk 10 feet activity   Assist           Walk 50 feet activity   Assist           Walk 150 feet activity   Assist           Walk 10 feet on uneven surface  activity   Assist           Wheelchair     Assist  Wheelchair 50 feet with 2 turns activity    Assist            Wheelchair 150 feet activity     Assist          Blood pressure 112/69, pulse 61, temperature 98.7 F (37.1 C), resp. rate 18, height 5' (1.524 m), weight 90.7 kg, SpO2 96 %.  Medical Problem List and Plan: 1.  Impaired mobility and ADLs secondary to multiple traumatic injuries following fall, possibly secondary to starting Cardizem.             -patient may shower but incision sites must be covered.              -ELOS/Goals: 10-14 days modI PT, OT, I in SLP  -Continue CIR 2.  Antithrombotics: -DVT/anticoagulation:  Pharmaceutical: Xarelto             -antiplatelet therapy:N/a 3. Pain Management: Continues to have pain--50% pain relief with hydrocodone. Will change to oxycodone  and monitor. Continue robaxin tid with tylenol qid (decrease to 650 mg). Better controlled with oxycodone.  4. Mood: Team to provide ego support. LCSW to follow for evaluation and support.              -antipsychotic agents: N/A 5. Neuropsych: This patient is capable of making decisions on her own behalf. 6. Skin/Wound Care: Routine pressure relief measures.  7. Fluids/Electrolytes/Nutrition: Monitor I/O. Check lytes in am.  8. T10/T11 fractures: TLSO when at edge of bed and out of bed. Encouraged to wear in bed for support comfort if tolerated.  9. Right pelvic fracture: WBAT 10. T2DM: Hgb A1c- 6.8. will monitor BS ac/hs. Resume metformin once intake improves.  6/29: CBGs poorly controled. Will monitor with increased mobility.  11. PAF: Monitor HR tid--continue Xarelto, Cardizem and Metoprolol XL daily.   12. ABLA: Due to fracture/hematoma. Will recheck H/H in am.  13. Leucocytosis: Monitor for signs of infection--appears to be resolving.  14. Vitamin D deficiency: was on supplement at home. Will add Vitamin D level to 6/29 labs.  6/29: Level is normal. Will not restart supplement.  15. Vitamin B12 deficiency: was on supplement at home. Will add Vitamin B level to 6/29 labs.   6/29: Level is normal. Will not restart supplement.  15. Disposition: Patient lives with her son and his fiance.       LOS: 1 days A FACE TO FACE EVALUATION WAS PERFORMED  Martha Clan P Jenya Putz 02/13/2020, 11:23 AM

## 2020-02-13 NOTE — Progress Notes (Signed)
[name], MD  Physician  Physical Medicine and Rehabilitation  PMR Pre-admission     Signed  Date of Service:  02/12/2020  7:40 PM          Signed       Show:Clear all '[]' Manual'[x]' Template'[x]' Copied  Added by: '[x]' Raulkar, Clide Deutscher, MD  '[]' Hover for details PMR Admission Coordinator Pre-Admission Assessment   Patient: Crystal Ashley is an 74 y.o., female MRN: [id number] DOB: 28-May-1946 Height: 5' (152.4 cm) Weight: 91.2 kg      Insurance Information HMO:     PPO:      PCP:      IPA:      80/20: YES      OTHER:  PRIMARY: Medicare A and B      Policy#: [id number]      Subscriber: pt CM Name:       Phone#:      Fax#:  Pre-Cert#: verified online via passportOne      Employer: n/a Benefits:  Phone #:      Name:  Eff. Date: 01/16/2011 Part A and 02/15/2011 Part  B     Deduct: $1484      Out of Pocket Max: n/a      Life Max: n/a CIR: 100%      SNF: 20 full days Outpatient: 80%     Co-Pay: 20% Home Health: 100%      Co-Pay:  DME: 80%     Co-Pay: 20% Providers:  Pt choice SECONDARY: [employer]      Policy#:[id number]     Phone#: n/a     The "Data Collection Information Summary" for patients in Inpatient Rehabilitation Facilities with attached "Privacy Act [place] Records" was provided and verbally reviewed with: Patient   Emergency Contact Information         Contact Information     Name Relation Home Work Mobile    Agan,Prashant Son     [phone]         Current Medical History  Patient Admitting Diagnosis: Right Pubic Ramus Fracture History of Present Illness:  Pt. is a 74 y.o. female with medical history significant of hypothyroidism; HTN; afib on Xarelto; chronic combined CHF, DM2, HLD,  and glaucoma presented to ED 02/06/20 with hip pain and hypotension.  She reports falling on 01/31/20 (Pt. Came to the ED at that time and x-rays were negative for fx)  and having ongoing back and R hip pain since fall.  Pt. seen by Ortho  and   Neurosurgery. 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 Per ortho, Pt. Also has right sup/inf pubic rami fxs and is WBAT.  Neurosurgery recommended thoracic and lumbar MRI for further evaluation as well as TLSO brace when out of bed.  No evidence of disruption of the posterior tension band on MRI thoracic and lumbar spine per neurosurgery.  She will need to follow-up in neurosurgery clinic in 4 weeks for an upright x-ray.No plans for surgical intervention at this time.   Patient's medical record from [facility] has been reviewed by the rehabilitation admission coordinator and physician.   Past Medical History      Past Medical History:  Diagnosis Date  . Atrial fibrillation ([place])    . Cancer ([place])    . Cataract    . Fracture 2001    left ankle  . Glaucoma    . Hypertension    .  Thyroid disease        Family History   family history includes Diabetes in her son; Healthy in her father and mother.   Prior Rehab/Hospitalizations Has the patient had prior rehab or hospitalizations prior to admission? No   Has the patient had major surgery during 100 days prior to admission? No               Current Medications   Current Facility-Administered Medications:  .  acetaminophen (TYLENOL) tablet 325-650 mg, 325-650 mg, Oral, Q4H PRN, Love, Pamela S, PA-C .  alum & mag hydroxide-simeth (MAALOX/MYLANTA) 200-200-20 MG/5ML suspension 30 mL, 30 mL, Oral, Q4H PRN, Love, Pamela S, PA-C .  [START ON 02/13/2020] atorvastatin (LIPITOR) tablet 10 mg, 10 mg, Oral, Daily, Love, Pamela S, PA-C .  bisacodyl (DULCOLAX) suppository 10 mg, 10 mg, Rectal, Daily PRN, Love, Pamela S, PA-C .  [START ON 02/13/2020] diltiazem (CARDIZEM CD) 24 hr capsule 180 mg, 180 mg, Oral, Daily, Love, Pamela S, PA-C .  diphenhydrAMINE (BENADRYL) 12.5 MG/5ML elixir 12.5-25 mg, 12.5-25 mg, Oral, Q6H PRN, Love, Pamela S, PA-C .  dorzolamide-timolol (COSOPT)  22.3-6.8 MG/ML ophthalmic solution 1 drop, 1 drop, Both Eyes, BID, Love, Pamela S, PA-C, 1 drop at 02/12/20 1933 .  [START ON 02/13/2020] furosemide (LASIX) tablet 20 mg, 20 mg, Oral, Daily, Love, Pamela S, PA-C .  guaiFENesin-dextromethorphan (ROBITUSSIN DM) 100-10 MG/5ML syrup 5-10 mL, 5-10 mL, Oral, Q6H PRN, Love, Pamela S, PA-C .  insulin aspart (novoLOG) injection 0-5 Units, 0-5 Units, Subcutaneous, QHS, Love, Pamela S, PA-C .  insulin aspart (novoLOG) injection 0-9 Units, 0-9 Units, Subcutaneous, TID WC, Love, Pamela S, PA-C .  latanoprost (XALATAN) 0.005 % ophthalmic solution 1 drop, 1 drop, Both Eyes, QHS, Love, Pamela S, PA-C .  [START ON 02/13/2020] levothyroxine (SYNTHROID) tablet 75 mcg, 75 mcg, Oral, QAC breakfast, Love, Pamela S, PA-C .  [START ON 02/13/2020] metoprolol succinate (TOPROL-XL) 24 hr tablet 100 mg, 100 mg, Oral, Daily, Love, Pamela S, PA-C .  oxyCODONE (Oxy IR/ROXICODONE) immediate release tablet 5-10 mg, 5-10 mg, Oral, Q4H PRN, Love, Pamela S, PA-C .  polyethylene glycol (MIRALAX / GLYCOLAX) packet 17 g, 17 g, Oral, Daily PRN, Love, Pamela S, PA-C .  polyethylene glycol (MIRALAX / GLYCOLAX) packet 17 g, 17 g, Oral, Daily PRN, Love, Pamela S, PA-C .  prochlorperazine (COMPAZINE) tablet 5-10 mg, 5-10 mg, Oral, Q6H PRN **OR** prochlorperazine (COMPAZINE) injection 5-10 mg, 5-10 mg, Intramuscular, Q6H PRN **OR** prochlorperazine (COMPAZINE) suppository 12.5 mg, 12.5 mg, Rectal, Q6H PRN, Love, Pamela S, PA-C .  [START ON 02/13/2020] Rivaroxaban (XARELTO) tablet 15 mg, 15 mg, Oral, Q supper, Love, Pamela S, PA-C .  senna-docusate (Senokot-S) tablet 2 tablet, 2 tablet, Oral, BID, Love, Pamela S, PA-C, 2 tablet at 02/12/20 1933 .  sodium phosphate (FLEET) 7-19 GM/118ML enema 1 enema, 1 enema, Rectal, Once PRN, Love, Pamela S, PA-C .  traMADol (ULTRAM) tablet 50 mg, 50 mg, Oral, Q6H PRN, Love, Pamela S, PA-C .  zolpidem (AMBIEN) tablet 5 mg, 5 mg, Oral, QHS PRN, Love, Pamela S, PA-C    Patients Current Diet:     Diet Order                      Diet heart healthy/carb modified Room service appropriate? Yes; Fluid consistency: Thin  Diet effective now                      Precautions / Restrictions  Restrictions Weight Bearing Restrictions: No    Has the patient had 2 or more falls or a fall with injury in the past year? Yes   Prior Activity Level   Prior Functional Level Self Care: Did the patient need help bathing, dressing, using the toilet or eating? Needed some help   Indoor Mobility: Did the patient need assistance with walking from room to room (with or without device)? Needed some help   Stairs: Did the patient need assistance with internal or external stairs (with or without device)? Needed some help   Functional Cognition: Did the patient need help planning regular tasks such as shopping or remembering to take medications? Needed some help   Home Assistive Devices / [place] Devices/Equipment: None   Prior Device Use: Indicate devices/aids used by the patient prior to current illness, exacerbation or injury? Walker   Current Functional Level Cognition   Orientation Level: Oriented X4    Extremity Assessment (includes Sensation/Coordination)            ADLs         Mobility         Transfers         Ambulation / Gait / Stairs / Proofreader / Balance       Special needs/care consideration  Diabetic management: Pt with DM2 on Novolog.  and Designated visitor: Kyli Sorter (son), Writer    Previous Home Environment (from acute therapy documentation) Living Arrangements: [place]: No   Discharge Living Setting Plans for Discharge Living Setting: Lives with (comment) (Son and his girlfriend). Pt.'s son confirmed that he and his girlfriend can provide 24/7 support following discharge and can provide min-mod assistance.  Type of Home at Discharge: House Discharge  Home Layout: Able to live on main level with bedroom/bathroom Discharge Home Access: Stairs to enter Entrance Stairs-Rails: Right Entrance Stairs-Number of Steps: 3 Discharge Bathroom Shower/Tub: Walk-in shower Discharge Bathroom Toilet: Handicapped height Discharge Bathroom Accessibility: Yes Does the patient have any problems obtaining your medications?: No   Social/Family/Support Systems   Goals   Decrease burden of Care through IP rehab admission: Specialzed equipment needs, Decrease number of caregivers, Bowel and bladder program and Patient/family education   Possible need for SNF placement upon discharge: Not anticipated   Patient Condition: I have reviewed medical records from [facility], spoken with CM, and patient and son. I met with patient at the bedside and discussed via phone for inpatient rehabilitation assessment.  Patient will benefit from ongoing PT and OT, can actively participate in 3 hours of therapy a day 5 days of the week, and can make measurable gains during the admission.  Patient will also benefit from the coordinated team approach during an Inpatient Acute Rehabilitation admission.  The patient will receive intensive therapy as well as Rehabilitation physician, nursing, social worker, and care management interventions.  Due to bladder management, safety, skin/wound care, disease management, medication administration, pain management and patient education the patient requires 24 hour a day rehabilitation nursing.  The patient is currently min assist +2 to mod assist +2 f with mobility and basic ADLs.  Discharge setting and therapy post discharge at home with home health is anticipated.  Patient has agreed to participate in the Acute Inpatient Rehabilitation Program and will admit today.   Preadmission Screen Completed By:  [name], 02/12/2020 7:40 PM ______________________________________________________________________   Discussed status  with Dr.  Raulkar on 02/12/2020 at 11:00 AM and received approval for admission today.   Admission Coordinator:  [name], MD, time 1130/Date 02/12/2020   Assessment/Plan: Diagnosis: Closed fracture of right pubic ramus 1. Does the need for close, 24 hr/day Medical supervision in concert with the patient's rehab needs make it unreasonable for this patient to be served in a less intensive setting? Yes 2. Co-Morbidities requiring supervision/potential complications: atrial fibrillation, HTN, type 2 diabetes mellitus, hypothyroidism, morbid obesity with BMI 39.7 3. Due to bladder management, bowel management, safety, skin/wound care, disease management, medication administration, pain management and patient education, does the patient require 24 hr/day rehab nursing? Yes 4. Does the patient require coordinated care of a physician, rehab nurse, PT, OT, and SLP to address physical and functional deficits in the context of the above medical diagnosis(es)? Yes Addressing deficits in the following areas: balance, endurance, locomotion, strength, transferring, bowel/bladder control, bathing, dressing, feeding, grooming, toileting and psychosocial support 5. Can the patient actively participate in an intensive therapy program of at least 3 hrs of therapy 5 days a week? Yes 6. The potential for patient to make measurable gains while on inpatient rehab is excellent 7. Anticipated functional outcomes upon discharge from inpatient rehab: modified independent PT, modified independent OT, independent SLP 8. Estimated rehab length of stay to reach the above functional goals is: 10-14 days 9. Anticipated discharge destination: Home 10. Overall Rehab/Functional Prognosis: excellent     MD Signature: Leeroy Cha        Note Details  Author Ranell Patrick, Clide Deutscher, MD File Time 02/12/2020  7:40 PM  Author Type Physician Status Signed  Last Editor [name], MD Service Physical Medicine and  [place] # [id number] Admit Date 02/12/2020

## 2020-02-13 NOTE — Plan of Care (Signed)
  Problem: Consults Goal: RH GENERAL PATIENT EDUCATION Description: See Patient Education module for education specifics. Outcome: Progressing Goal: Skin Care Protocol Initiated - if Braden Score 18 or less Description: If consults are not indicated, leave blank or document N/A Outcome: Progressing   Problem: RH BLADDER ELIMINATION Goal: RH STG MANAGE BLADDER WITH ASSISTANCE Description: STG Manage Bladder With mod I Assistance Outcome: Progressing   Problem: RH SKIN INTEGRITY Goal: RH STG SKIN FREE OF INFECTION/BREAKDOWN Description: Patients skin will remain free from further infection or breakdown with min assist.  Outcome: Progressing Goal: RH STG MAINTAIN SKIN INTEGRITY WITH ASSISTANCE Description: STG Maintain Skin Integrity With min Assistance. Outcome: Progressing   Problem: RH SAFETY Goal: RH STG ADHERE TO SAFETY PRECAUTIONS W/ASSISTANCE/DEVICE Description: STG Adhere to Safety Precautions With mod I Assistance/Device. Outcome: Progressing   Problem: RH PAIN MANAGEMENT Goal: RH STG PAIN MANAGED AT OR BELOW PT'S PAIN GOAL Description: < 4 Outcome: Progressing

## 2020-02-13 NOTE — Progress Notes (Signed)
Physical Therapy Session Note  Patient Details  Name: Crystal Ashley MRN: [id number] Date of Birth: Jan 16, 1946  Today's Date: 02/13/2020 PT Missed Time: 23 Minutes Missed Time Reason: Patient fatigue;Pain  Pt received supine in bed awake, watching television. Pt declines therapy at this time stating she doesn't feel up to getting OOB as she is tired and has pain rated as 6/10 in her back. Pt declines notifying RN about pain and declines any other interventions from therapist. Therapist educated patient on inpatient rehab therapy schedule 3hrs/day and importance of participation and increasing mobility level in preparation for discharge home but despite this she continues to defer therapy at this time. Missed 30 minutes of skilled physical therapy.   Tawana Scale, PT, DPT 02/13/2020, 4:25 PM

## 2020-02-14 ENCOUNTER — Inpatient Hospital Stay (HOSPITAL_COMMUNITY): Payer: Medicare Other | Admitting: Physical Therapy

## 2020-02-14 ENCOUNTER — Inpatient Hospital Stay (HOSPITAL_COMMUNITY): Payer: Medicare Other | Admitting: Occupational Therapy

## 2020-02-14 LAB — GLUCOSE, CAPILLARY
Glucose-Capillary: 161 mg/dL — ABNORMAL HIGH (ref 70–99)
Glucose-Capillary: 176 mg/dL — ABNORMAL HIGH (ref 70–99)
Glucose-Capillary: 176 mg/dL — ABNORMAL HIGH (ref 70–99)
Glucose-Capillary: 168 mg/dL — ABNORMAL HIGH (ref 70–99)

## 2020-02-14 LAB — HEMOGLOBIN A1C
Hgb A1c MFr Bld: 6.7 % — ABNORMAL HIGH (ref 4.8–5.6)
Mean Plasma Glucose: 146 mg/dL

## 2020-02-14 NOTE — Progress Notes (Signed)
Occupational Therapy Session Note  Patient Details  Name: Kash Mothershead MRN: [id number] Date of Birth: [birth year]  Today's Date: 02/14/2020 OT Individual Time: 1448-1856 OT Individual Time Calculation (min): 59 min    Short Term Goals: Week 1:  OT Short Term Goal 1 (Week 1): patient will complete bed mobility with min A OT Short Term Goal 2 (Week 1): patient will tolerate adl w/c level with min A using ADs OT Short Term Goal 3 (Week 1): patient will complete functional transfers with CG/min A using RW  Skilled Therapeutic Interventions/Progress Updates:    Session 1 ([id number])  Pt in bed to start session, agreeable to getting up for ADL at the sink.  She transitioned to sitting with mod assist on the left side of the bed.  Total assist was needed for donning her TLSO in sitting before transferring to the wheelchair with min assist using the RW for support.  She worked on [employer] with setup assist.  She did not remove her current gown brought from home, but instead washed under it, after therapist assisted with removal of brace.  She was able to wash her upper legs and front peri area, but needed mod assist from therapist for washing the lower legs as well as for washing her buttocks thoroughly.  While washing, she expressed the need to go to the bathroom, so she ambulated into there with use of the RW for support and min assist.  She needed max assist for donning new brief.  Finished session with transfer back out to the wheelchair with min assist to complete session.  Pt was left sitting up in the wheelchair with her son present and call button and phone in reach.  Safety belt in place.    Session 2 [phone])  Pt in bed to start session, agreeable to session.  She needed mod assist for transfer to the EOB with total assist for donning TLSO.  She then completed mod assist for transfer to the wheelchair in order to work on use of AE for LB selfcare.  She was able to remove her gripper socks  with use of the reacher with mod instructional cueing and supervision.  She was then able to donn the socks with use of the sockaide and overall min assist.  Educated pt and son on possible use of the shoe funnel.  He plans to get larger slip on shoes for practice in further sessions.  She was able to complete transfer back to the bed at conclusion of session with min assist for transfer to the bed and max assist for transition to supine.  Pt left with call button and phone in reach with bed alarm in place.    Therapy Documentation Precautions:  Precautions Precautions: Back, Fall Required Braces or Orthoses: Spinal Brace Spinal Brace: Thoracolumbosacral orthotic, Applied in sitting position, Other (comment) Spinal Brace Comments: TLSO when OOB Restrictions Weight Bearing Restrictions: Yes RLE Weight Bearing: Weight bearing as tolerated LLE Weight Bearing: Weight bearing as tolerated  Pain:  3/10 in her middle back.  Pt repositioned and given emotional support  ADL: See Care Tool Section for some details of mobility and selfcare  Therapy/Group: Individual Therapy  Tylar Amborn OTR/L 02/14/2020, 3:52 PM

## 2020-02-14 NOTE — Progress Notes (Signed)
[place] PHYSICAL MEDICINE & REHABILITATION PROGRESS NOTE   Subjective/Complaints:  Problem with therapy participation according to PT. OT, pt denies pain.  Discussed importance of full participation in therapy program  ROS: Denies pain, constipation, insomnia.   Objective:   No results found. Recent Labs    02/13/20 0548  WBC 10.5  HGB 9.5*  HCT 32.0*  PLT 298   Recent Labs    02/13/20 0548  NA 139  K 4.1  CL 101  CO2 28  GLUCOSE 160*  BUN 17  CREATININE 0.90  CALCIUM 8.8*    Intake/Output Summary (Last 24 hours) at 02/14/2020 1049 Last data filed at 02/14/2020 0730 Gross per 24 hour  Intake 498 ml  Output 650 ml  Net -152 ml     Physical Exam: Vital Signs Blood pressure 113/89, pulse 74, temperature 98.5 F (36.9 C), resp. rate 18, height 5' (1.524 m), weight 91.2 kg, SpO2 92 %.   General: No acute distress Mood and affect are appropriate Heart: Regular rate and rhythm no rubs murmurs or extra sounds Lungs: Clear to auscultation, breathing unlabored, no rales or wheezes Abdomen: Positive bowel sounds, soft nontender to palpation, nondistended Extremities: No clubbing, cyanosis, or edema Skin: No evidence of breakdown, no evidence of rash Neurologic: Cranial nerves II through XII intact, motor strength is 4/5 in bilateral deltoid, bicep, tricep, grip, hip flexor, knee extensors, ankle dorsiflexor and plantar flexor Sensory exam normal sensation to light touch and proprioception in bilateral upper and lower extremities Cerebellar exam normal finger to nose to finger as well as heel to shin in bilateral upper and lower extremities Musculoskeletal: Full range of motion in all 4 extremities. No joint swelling  Psych: Pt's affect is appropriate. Pt is cooperative  Assessment/Plan: 1. Functional deficits secondary to multiple fractures following fall, which require 3+ hours per day of interdisciplinary therapy in a comprehensive inpatient rehab  setting.  Physiatrist is providing close team supervision and 24 hour management of active medical problems listed below.  Physiatrist and rehab team continue to assess barriers to discharge/monitor patient progress toward functional and medical goals  Care Tool:  Bathing    Body parts bathed by patient: Right arm, Left arm, Chest, Abdomen, Face   Body parts bathed by helper: Front perineal area, Buttocks, Right upper leg, Left upper leg, Right lower leg, Left lower leg     Bathing assist Assist Level: Maximal Assistance - Patient 24 - 49%     Upper Body Dressing/Undressing Upper body dressing   What is the patient wearing?: Pull over shirt    Upper body assist Assist Level: Moderate Assistance - Patient 50 - 74%    Lower Body Dressing/Undressing Lower body dressing      What is the patient wearing?: Incontinence brief     Lower body assist Assist for lower body dressing: Maximal Assistance - Patient 25 - 49%     Toileting Toileting    Toileting assist Assist for toileting: Maximal Assistance - Patient 25 - 49%     Transfers Chair/bed transfer  Transfers assist     Chair/bed transfer assist level: Minimal Assistance - Patient > 75%     Locomotion Ambulation   Ambulation assist      Assist level: Moderate Assistance - Patient 50 - 74% Assistive device: No Device Max distance: 10f   Walk 10 feet activity   Assist  Walk 10 feet activity did not occur: Safety/medical concerns (pt not safe to ambulate further without AD)  Walk 50 feet activity   Assist Walk 50 feet with 2 turns activity did not occur: Safety/medical concerns         Walk 150 feet activity   Assist Walk 150 feet activity did not occur: Safety/medical concerns         Walk 10 feet on uneven surface  activity   Assist Walk 10 feet on uneven surfaces activity did not occur: Safety/medical concerns         Wheelchair     Assist Will patient use  wheelchair at discharge?: No (if used w/c would be for dependent community transport)             Wheelchair 50 feet with 2 turns activity    Assist            Wheelchair 150 feet activity     Assist          Blood pressure 113/89, pulse 74, temperature 98.5 F (36.9 C), resp. rate 18, height 5' (1.524 m), weight 91.2 kg, SpO2 92 %.  Medical Problem List and Plan: 1.  Impaired mobility and ADLs secondary to multiple traumatic injuries following fall, possibly secondary to starting Cardizem.             -patient may shower but incision sites must be covered.              -ELOS/Goals: 10-14 days modI PT, OT, I in SLP  -Team conference today please see physician documentation under team conference tab, met with team  to discuss problems,progress, and goals. Formulized individual treatment plan based on medical history, underlying problem and comorbidities. 2.  Antithrombotics: -DVT/anticoagulation:  Pharmaceutical: Xarelto , Hgb stable             -antiplatelet therapy:N/a 3. Pain Management:no pain at present per nsg not asking for pain medication often. Continue robaxin tid with tylenol qid (decrease to 650 mg). Better controlled with oxycodone.  4. Mood: Team to provide ego support. LCSW to follow for evaluation and support.              -antipsychotic agents: N/A 5. Neuropsych: This patient is capable of making decisions on her own behalf. 6. Skin/Wound Care: Routine pressure relief measures.  7. Fluids/Electrolytes/Nutrition: Monitor I/O. Check lytes in am.  8. T10/T11 fractures: TLSO when at edge of bed and out of bed. Encouraged to wear in bed for support comfort if tolerated.  9. Right pelvic fracture: WBAT 10. T2DM: Hgb A1c- 6.8. will monitor BS ac/hs. Resume metformin once intake improves.  6/29: CBGs poorly controled. Will monitor with increased mobility.  11. PAF: Monitor HR tid--continue Xarelto, Cardizem and Metoprolol XL daily.   12. ABLA: Due to  fracture/hematoma. Stable at 9.5 13. Leucocytosis: Monitor for signs of infection--appears to be resolving.  14. Vitamin D deficiency: was on supplement at home. Will add Vitamin D level to 6/29 labs.  6/29: Level is normal. Will not restart supplement.  15. Vitamin B12 deficiency: was on supplement at home. Will add Vitamin B level to 6/29 labs.   6/29: Level is normal. Will not restart supplement.  15. Disposition: Patient lives with her son and his fiance.       LOS: 2 days A FACE TO FACE EVALUATION WAS PERFORMED  Charlett Blake 02/14/2020, 10:49 AM

## 2020-02-14 NOTE — Patient Care Conference (Signed)
Inpatient RehabilitationTeam Conference and Plan of Care Update Date: 02/14/2020   Time: 1:13 PM    Patient Name: Crystal Ashley      Medical Record Number: [id number]  Date of Birth: [birth year] Sex: Female         Room/Bed: 4W03C/4W03C-01 Payor Info: Payor: MEDICARE / Plan: MEDICARE PART A AND B / Product Type: *No Product type* /    Admit Date/Time:  02/12/2020  5:50 PM  Primary Diagnosis:  Multiple traumatic injuries  Patient Active Problem List   Diagnosis Date Noted  . Multiple traumatic injuries 02/12/2020  . Closed fracture of multiple pubic rami, right, initial encounter ([place]) 02/07/2020  . Atrial fibrillation, chronic ([place]) 02/07/2020  . Chronic combined systolic (congestive) and diastolic (congestive) heart failure ([place])   . Acute respiratory failure with hypoxia ([place]) 10/18/2018  . Declining mobility 09/23/2018  . B12 deficiency 09/23/2018  . Morbid obesity with BMI of 40.0-44.9, adult ([place]) 09/23/2018  . Anemia 09/23/2018  . History of cardioversion 04/26/2018  . Type 2 diabetes mellitus without complication, without long-term current use of insulin ([place]) 03/15/2018  . Atrial fibrillation with RVR ([place]) 03/15/2018  . Morbid obesity ([place]) 01/04/2017  . Hyperlipidemia associated with type 2 diabetes mellitus ([place]) 01/04/2017  . Vitamin D deficiency 12/15/2016  . Glaucoma   . Hypertension associated with diabetes ([place]) 04/19/2014  . Hypothyroidism 04/19/2014  . History of endometrial cancer 03/30/2014    Expected Discharge Date: Expected Discharge Date:  (2 weeks)  Team Members Present: Physician leading conference: Dr. Alysia Penna Nurse Present: Rayne Du, LPN PT Present: Barrie Folk, PT OT Present: Clyda Greener, OT PPS Coordinator present : Gunnar Fusi, SLP     Current Status/Progress Goal Weekly Team Focus  Bowel/Bladder   Patient is mostly continent of bowel and bladder.  Adult brief for occasional incontinence.  LBM 02/13/20  Patient will  maintain bladder and bowel continence during inpatient rehab stay  Assess GI/GU needs Qshift and PRN.   Swallow/Nutrition/ Hydration             ADL's   total assist overall  min assist to supervision  selfcare retraining, balance retraining, DME education, AE education, pt/family education, transfer retraining,therapeutic exercise   Mobility   mod assist bed mobility, min assist sit<>stand and stand pivot transfers using RW, min assist gait 29ft using RW, 3 steps (3" height) using B HRs with min assist  supervision overall  bed mobility, functional transfers, gait training, stair navigation, activity tolerance, and pt/famliy education   Communication             Safety/Cognition/ Behavioral Observations            Pain   Patient c/o back pain managed with PRN Tramadol and Oxycodone  Patient will report pain level less than 4 and tolerate increased activity  Assess pain Qshift and PRN   Skin   Patient has fissure to buttocks with foam dressing in place.  Skin otherwise intact.  Patient will maintain skin integrity and remain free of infection during inpatient rehab stay  Assess skin Qshift and PRN.  Maintain skin integrity with hydration, protein and skin care.    Rehab Goals Patient on target to meet rehab goals: Yes Rehab Goals Revised: Patient being evaluated *See Care Plan and progress notes for long and short-term goals.     Barriers to Discharge  Current Status/Progress Possible Resolutions Date Resolved   Nursing  Weight bearing restrictions  PT  Inaccessible home environment;Weight;Home environment access/layout                 OT                  SLP                Care Coordinator     on target          Discharge Planning/Teaching Needs:  Patient plans to discharge home with son  Will schedule if needed   Team Discussion:  Continent of B+B. MASD to buttocks. Pain managed with prn medication. Plan discharge home with son and 3 steps to entry of  home. Function limited by fatigue, cues for safety and delayed TUG = high risk for falls. Supervision - min assist goals set.  Revisions to Treatment Plan:  Work on steps with bil rails for discharge.    Medical Summary                 I attest that I was present, lead the team conference, and concur with the assessment and plan of the team.   Dorien Chihuahua B 02/14/2020, 1:13 PM

## 2020-02-14 NOTE — Progress Notes (Signed)
Patient ID: Crystal Ashley, female   DOB: [birth year], 74 y.o.   MRN: [id number]  Team Conference Report to Patient/Family  Team Conference discussion was reviewed with the patient and caregiver, including goals, any changes in plan of care and target discharge date.  Patient and caregiver express understanding and are in agreement.  The patient has a target discharge date of  (2 weeks).  Dyanne Iha 02/14/2020, 2:17 PM

## 2020-02-14 NOTE — Progress Notes (Signed)
Physical Therapy Session Note  Patient Details  Name: Crystal Ashley MRN: [id number] Date of Birth: 10/10/1945  Today's Date: 02/14/2020 PT Individual Time: 0810-0905 1300-1309 PT Individual Time Calculation (min): 55 min 9 min   Short Term Goals: Week 1:  PT Short Term Goal 1 (Week 1): = to LTGs based on ELOS  Skilled Therapeutic Interventions/Progress Updates:     Session 1  Pt received supine in bed and agreeable to PT. Supine>sit transfer with min  assist and min cues for back precautions and use of log roll technique. PT applied TLSO and Bil shoes in sitting with total A for time management.   Stand pivot transfer to transfer. To WC with min assist for safety and to power up to standing. Cues for proper UE placement with poor carry over. Sit<>stand performed with min-mod assist throughout treatment with moderate cues for UE placemet to push from [facility], with intermittent use of UE on RW vs arm rests.    Pt performed 5xSTS: 103 sec (>15 sec indicates increased fall risk)   PT instructed pt in TUG: 102 sec (average of 2 trials; >13.5 sec indicates increased fall risk). Prolonged rest break between bouts due to fatigue and mild increase in LBP.   Pt returned to room and performed stand pivot transfer to bed with min-mod assist. Sit>supine completed with mod assist for BLE management and left supine in bed with call bell in reach and all needs met.    Session 2.  Pt received supine in bed and agreeable to PT with significant encouragement. PT set up pt to perform sit>supine and pt initiated roll, but then requesting PT to help her get covers back on. PT encouraged engagement in therapy to improve functional movement and strength, but pt stating that she was too tired, and would be able to do it later. Pt left in bed with call bell in reach and all needs met.     Therapy Documentation Precautions:  Precautions Precautions: Back, Fall Required Braces or Orthoses: Spinal Brace Spinal  Brace: Thoracolumbosacral orthotic, Applied in sitting position, Other (comment) Spinal Brace Comments: TLSO when OOB Restrictions Weight Bearing Restrictions: Yes RLE Weight Bearing: Weight bearing as tolerated LLE Weight Bearing: Weight bearing as tolerated    Pain: Pain Assessment Pain Scale: 0-10 Pain Score: 5  Pain Type: Chronic pain Pain Location: Back Pain Orientation: Mid;Lower Pain Descriptors / Indicators: Aching;Discomfort Pain Frequency: Intermittent Pain Onset: On-going Patients Stated Pain Goal: 0 Pain Intervention(s): Ambulation/increased activity     Balance: Standardized Balance Assessment Standardized Balance Assessment: Timed Up and Go Test;Berg Balance Test Timed Up and Go Test TUG: Normal TUG Normal TUG (seconds): 103    Therapy/Group: Individual Therapy  [name] 02/14/2020, 9:09 AM

## 2020-02-14 NOTE — IPOC Note (Signed)
Overall Plan of Care [facility]) Patient Details Name: Crystal Ashley MRN: [id number] DOB: 1945/09/10  Admitting Diagnosis: Multiple traumatic injuries  Hospital Problems: Principal Problem:   Multiple traumatic injuries     Functional Problem List: Nursing Bladder, Endurance, Medication Management, Motor, Pain, Skin Integrity  PT Balance, Behavior, Safety, Edema, Sensory, Endurance, Skin Integrity, Motor, Nutrition, Pain  OT Balance, Endurance, Pain  SLP    TR         Basic ADL's: OT Grooming, Bathing, Dressing, Toileting     Advanced  ADL's: OT       Transfers: PT Bed Mobility, Bed to Chair, Car, Manufacturing systems engineer, Metallurgist: PT Ambulation, Stairs     Additional Impairments: OT    SLP        TR      Anticipated Outcomes Item Anticipated Outcome  Self Feeding independent  Swallowing      Basic self-care  min a  Toileting  CGA/CS   Bathroom Transfers CGA  Bowel/Bladder  Mod I assist  Transfers  supervision  Locomotion  supervision  Communication     Cognition     Pain  < 4  Safety/Judgment  Mod I assist   Therapy Plan: PT Intensity: Minimum of 1-2 x/day ,45 to 90 minutes PT Frequency: 5 out of 7 days PT Duration Estimated Length of Stay: 7-10days OT Intensity: Minimum of 1-2 x/day, 45 to 90 minutes OT Frequency: 5 out of 7 days OT Duration/Estimated Length of Stay: 7-10 days     Due to the current state of emergency, patients may not be receiving their 3-hours of Medicare-mandated therapy.   Team Interventions: Nursing Interventions Patient/Family Education, Disease Management/Prevention, Bladder Management, Medication Management, Pain Management, Skin Care/Wound Management  PT interventions Ambulation/gait training, Community reintegration, DME/adaptive equipment instruction, Neuromuscular re-education, Stair training, UE/LE Strength taining/ROM, Psychosocial support, Training and development officer, Discharge planning,  Functional electrical stimulation, Pain management, Skin care/wound management, Therapeutic Activities, UE/LE Coordination activities, Cognitive remediation/compensation, Disease management/prevention, Functional mobility training, Patient/family education, Splinting/orthotics, Therapeutic Exercise, Visual/perceptual remediation/compensation  OT Interventions Balance/vestibular training, Self Care/advanced ADL retraining, Therapeutic Exercise, DME/adaptive equipment instruction, Pain management, UE/LE Strength taining/ROM, Patient/family education, Discharge planning, Functional mobility training, Therapeutic Activities  SLP Interventions    TR Interventions    SW/CM Interventions Discharge Planning, Psychosocial Support, Patient/Family Education   Barriers to Discharge MD  Medical stability  Nursing Weight bearing restrictions    PT Inaccessible home environment, Weight, Home environment access/layout    OT      SLP      SW       Team Discharge Planning: Destination: PT-Home ,OT- Home , SLP-  Projected Follow-up: PT-Home health PT, 24 hour supervision/assistance, OT-  Home health OT, SLP-  Projected Equipment Needs: PT-Other (comment), OT- To be determined, SLP-  Equipment Details: PT-RW - pt has standard walker and w/c, OT-  Patient/family involved in discharge planning: PT- Family member/caregiver, Patient,  OT-Family member/caregiver, SLP-   MD ELOS: 7-10d Medical Rehab Prognosis:  Good Assessment:  74 year old female with history of T2DM, CHF, A fib, fall on 01/31/20 who was evaluated in ED and plain films negative. [place] felt to be due to hypotension and she was encouraged to increase fluid intake. She has had problems ambulating with mid back pain, inability to perform ADLs and limited mobility due to pain. She was admitted for work up and found to be hypotensive with AKI. Renal ultrasound showed bilateral renal cortical atrophy without mass or  hydronephrosis. Dr. Hollie Salk consulted  and felt that patient with acute on chronic renal failure and recommended IVF for gentle hydration and holding Lasix/ARB for now. She was found to have non-displaced superior/inferior pubic rami fractures. Dr. Marlou Sa consulted and recommended WBAT.    Family requested CT back for work up due to pain. CT lumbar, thoracic and cervical spine done revealing acute T10 and T 11 fracture in setting of diffuse thoracic ankylosis, chronic L1 compression fracture with 45% loss of height. Patient without neurologic symptoms and no B/B issues.  Dr. Marcello Moores consulted for input and MRI lumbar/thoracic spine done showing acute T10 and T11 vertebral body fractures with 25% loss of height at T11 without retropulsion or cord compression. He recommended TLSO to be worn when out of bed X 3 months--no surgery indicated and treatment of osteoporosis. To follow up in 4 weeks with upright films.  AKI improved with hydration nd lasix resumed today. ABLA being monitored. Therapy ongoing and patient limited by pain and weakness. CIR recommended due to functional decline.   Now requiring 24/7 Rehab RN,MD, as well as CIR level PT, OT and SLP.  Treatment team will focus on ADLs and mobility with goals set at ModI/Sup  See Team Conference Notes for weekly updates to the plan of care

## 2020-02-15 ENCOUNTER — Inpatient Hospital Stay (HOSPITAL_COMMUNITY): Payer: Medicare Other | Admitting: Occupational Therapy

## 2020-02-15 ENCOUNTER — Inpatient Hospital Stay (HOSPITAL_COMMUNITY): Payer: Medicare Other | Admitting: Physical Therapy

## 2020-02-15 LAB — GLUCOSE, CAPILLARY
Glucose-Capillary: 141 mg/dL — ABNORMAL HIGH (ref 70–99)
Glucose-Capillary: 209 mg/dL — ABNORMAL HIGH (ref 70–99)
Glucose-Capillary: 144 mg/dL — ABNORMAL HIGH (ref 70–99)
Glucose-Capillary: 146 mg/dL — ABNORMAL HIGH (ref 70–99)

## 2020-02-15 MED ORDER — DILTIAZEM HCL ER COATED BEADS 240 MG PO CP24
240.0000 mg | ORAL_CAPSULE | Freq: Every day | ORAL | Status: DC
  Administered 2020-02-16 – 2020-02-25 (×10): 240 mg via ORAL
  Filled 2020-02-15 (×11): qty 1

## 2020-02-15 NOTE — Progress Notes (Signed)
Occupational Therapy Session Note  Patient Details  Name: Crystal Ashley MRN: [id number] Date of Birth: [birth year]  Today's Date: 02/15/2020 OT Individual Time:first session 1000-1015; second session [phone] OT Individual Time Calculation (min): first session: 15 min ; second session:  32 min    Short Term Goals: Week 1:  OT Short Term Goal 1 (Week 1): patient will complete bed mobility with min A OT Short Term Goal 2 (Week 1): patient will tolerate adl w/c level with min A using ADs OT Short Term Goal 3 (Week 1): patient will complete functional transfers with CG/min A using RW  Skilled Therapeutic Interventions/Progress Updates:    Pt supine in bed with son at side upon OT arrival.  Pt reporting 4/10 pain at rest in low back and son reports she refused her pain medicine recently.  Pt reports she already washed at sink and would like to go to gym for exercise instead.  Instructed pt through log rolling to left with mod assist, however pt c/o of sharp low back pain and returning self to supine.  OT made nurse aware of pts request for pain medication.  Pt requesting for OT to come back in 30 minutes.  Upon OT return, pts son reports she received pain medication, and pt agreeable to working with OT.  Pt completed left log roll and sidelying to sit with mod assist.  Educated pts son on donning technique of TLSO using visual demonstration and VCs with pt sitting EOB.   Pt transferred EOB to w/c using RW with min assist for initial boost from bed and CGA thereafter.  Dependent transport completed to small gym where pt completed UBE on level 1 for 1 minute increments with 1-2 min RBs needed in between due to fatigue and SOB.  O2 and pulse monitored intermittently through session noting SPO2 on room air ranging 94%-100% and pulse ranging 80s to low 90s.  Pt returned to room with dependent w/c transport and requesting back to bed.  Pt transferred w/c to EOB using RW with min assist for initial sit to  stand and CGA thereafter. Educated pts son on doffing TLSO technique. Pt  Max assist sit to supine and education provided on safe body mechanics to follow spinal precautions.  Call bell in reach, bed alarm on.  Therapy Documentation Precautions:  Precautions Precautions: Back, Fall Required Braces or Orthoses: Spinal Brace Spinal Brace: Thoracolumbosacral orthotic, Applied in sitting position, Other (comment) Spinal Brace Comments: TLSO when OOB Restrictions Weight Bearing Restrictions: No RLE Weight Bearing: Weight bearing as tolerated LLE Weight Bearing: Weight bearing as tolerated   Therapy/Group: Individual Therapy  Ezekiel Slocumb 02/15/2020, 12:17 PM

## 2020-02-15 NOTE — Progress Notes (Signed)
[place] PHYSICAL MEDICINE & REHABILITATION PROGRESS NOTE   Subjective/Complaints:   ROS: Denies pain, constipation, insomnia.   Objective:   No results found. Recent Labs    02/13/20 0548  WBC 10.5  HGB 9.5*  HCT 32.0*  PLT 298   Recent Labs    02/13/20 0548  NA 139  K 4.1  CL 101  CO2 28  GLUCOSE 160*  BUN 17  CREATININE 0.90  CALCIUM 8.8*    Intake/Output Summary (Last 24 hours) at 02/15/2020 0833 Last data filed at 02/15/2020 0425 Gross per 24 hour  Intake 236 ml  Output 500 ml  Net -264 ml     Physical Exam: Vital Signs Blood pressure (!) 151/103, pulse 81, temperature 98 F (36.7 C), resp. rate 20, height 5' (1.524 m), weight 91.4 kg, SpO2 99 %.   General: No acute distress Mood and affect are appropriate Heart: Regular rate and rhythm no rubs murmurs or extra sounds Lungs: Clear to auscultation, breathing unlabored, no rales or wheezes Abdomen: Positive bowel sounds, soft nontender to palpation, nondistended Extremities: No clubbing, cyanosis, or edema Skin: No evidence of breakdown, no evidence of rash  Musculoskeletal: Full range of motion in all 4 extremities. No joint swelling  Psych: Pt's affect is appropriate. Pt is cooperative  Assessment/Plan: 1. Functional deficits secondary to multiple fractures following fall, which require 3+ hours per day of interdisciplinary therapy in a comprehensive inpatient rehab setting.  Physiatrist is providing close team supervision and 24 hour management of active medical problems listed below.  Physiatrist and rehab team continue to assess barriers to discharge/monitor patient progress toward functional and medical goals  Care Tool:  Bathing    Body parts bathed by patient: Right arm, Left arm, Chest, Abdomen, Right upper leg, Left upper leg, Face   Body parts bathed by helper: Front perineal area, Buttocks, Right lower leg, Left lower leg     Bathing assist Assist Level: Moderate Assistance -  Patient 50 - 74%     Upper Body Dressing/Undressing Upper body dressing Upper body dressing/undressing activity did not occur (including orthotics): N/A What is the patient wearing?: Dress    Upper body assist Assist Level: Minimal Assistance - Patient > 75%    Lower Body Dressing/Undressing Lower body dressing      What is the patient wearing?: Incontinence brief     Lower body assist Assist for lower body dressing: Maximal Assistance - Patient 25 - 49%     Toileting Toileting    Toileting assist Assist for toileting: Maximal Assistance - Patient 25 - 49%     Transfers Chair/bed transfer  Transfers assist     Chair/bed transfer assist level: Minimal Assistance - Patient > 75%     Locomotion Ambulation   Ambulation assist      Assist level: Minimal Assistance - Patient > 75% Assistive device: Walker-rolling Max distance: 10'   Walk 10 feet activity   Assist  Walk 10 feet activity did not occur: Safety/medical concerns (pt not safe to ambulate further without AD)        Walk 50 feet activity   Assist Walk 50 feet with 2 turns activity did not occur: Safety/medical concerns         Walk 150 feet activity   Assist Walk 150 feet activity did not occur: Safety/medical concerns         Walk 10 feet on uneven surface  activity   Assist Walk 10 feet on uneven surfaces activity did not  occur: Safety/medical concerns         Wheelchair     Assist Will patient use wheelchair at discharge?: No (if used w/c would be for dependent community transport)             Wheelchair 50 feet with 2 turns activity    Assist            Wheelchair 150 feet activity     Assist          Blood pressure (!) 151/103, pulse 81, temperature 98 F (36.7 C), resp. rate 20, height 5' (1.524 m), weight 91.4 kg, SpO2 99 %.  Medical Problem List and Plan: 1.  Impaired mobility and ADLs secondary to multiple traumatic injuries  following fall, possibly secondary to starting Cardizem.                           -ELOS/Goals: 10-14 days modI PT, OT, I in SLP   2.  Antithrombotics: -DVT/anticoagulation:  Pharmaceutical: Xarelto , Hgb stable             -antiplatelet therapy:N/a 3. Pain Management:no pain at present per nsg not asking for pain medication often. Continue robaxin tid with tylenol qid (decrease to 650 mg). Better controlled with oxycodone.  4. Mood: Team to provide ego support. LCSW to follow for evaluation and support.              -antipsychotic agents: N/A 5. Neuropsych: This patient is capable of making decisions on her own behalf. 6. Skin/Wound Care: Routine pressure relief measures.  7. Fluids/Electrolytes/Nutrition: Monitor I/O. Check lytes in am.  8. T10/T11 fractures: TLSO when at edge of bed and out of bed. Encouraged to wear in bed for support comfort if tolerated.  9. Right pelvic fracture: WBAT 10. T2DM: Hgb A1c- 6.8. will monitor BS ac/hs. Resume metformin once intake improves.  6/29: CBGs poorly controled. Will monitor with increased mobility.  CBG (last 3)  Recent Labs    02/14/20 1635 02/14/20 2055 02/15/20 0613  GLUCAP 176* 168* 141*  fair control will monitor   11. PAF: Monitor HR tid--continue Xarelto, Cardizem and Metoprolol XL daily.  Vitals:   02/15/20 0611 02/15/20 0613  BP: (!) 155/99 (!) 151/103  Pulse: 82 81  Resp: 20   Temp: 98 F (36.7 C)   SpO2: 99% 99%   Rate ok stilll with elevated systolic will increase diltiazem in am  12. ABLA: Due to fracture/hematoma. Stable at 9.5 13. Leucocytosis: Monitor for signs of infection--appears to be resolving.  14. Vitamin D deficiency: was on supplement at home. Will add Vitamin D level to 6/29 labs.  6/29: Level is normal. Will not restart supplement.  15. Vitamin B12 deficiency: was on supplement at home. Will add Vitamin B level to 6/29 labs.   6/29: Level is normal. Will not restart supplement.  15. Disposition: Patient  lives with her son and his fiance.       LOS: 3 days A FACE TO FACE EVALUATION WAS PERFORMED  Charlett Blake 02/15/2020, 8:33 AM

## 2020-02-15 NOTE — Plan of Care (Signed)
  Problem: Consults Goal: RH GENERAL PATIENT EDUCATION Description: See Patient Education module for education specifics. Outcome: Progressing Goal: Skin Care Protocol Initiated - if Braden Score 18 or less Description: If consults are not indicated, leave blank or document N/A Outcome: Progressing   Problem: RH BLADDER ELIMINATION Goal: RH STG MANAGE BLADDER WITH ASSISTANCE Description: STG Manage Bladder With mod I Assistance Outcome: Progressing   Problem: RH SKIN INTEGRITY Goal: RH STG SKIN FREE OF INFECTION/BREAKDOWN Description: Patients skin will remain free from further infection or breakdown with min assist.  Outcome: Progressing Goal: RH STG MAINTAIN SKIN INTEGRITY WITH ASSISTANCE Description: STG Maintain Skin Integrity With min Assistance. Outcome: Progressing   Problem: RH SAFETY Goal: RH STG ADHERE TO SAFETY PRECAUTIONS W/ASSISTANCE/DEVICE Description: STG Adhere to Safety Precautions With mod I Assistance/Device. Outcome: Progressing   Problem: RH PAIN MANAGEMENT Goal: RH STG PAIN MANAGED AT OR BELOW PT'S PAIN GOAL Description: < 4 Outcome: Progressing

## 2020-02-15 NOTE — Progress Notes (Signed)
Occupational Therapy Session Note  Patient Details  Name: Crystal Ashley MRN: [id number] Date of Birth: [birth year]  Today's Date: 02/15/2020 OT Individual Time: 0800-0900 OT Individual Time Calculation (min): 60 min    Short Term Goals: Week 1:  OT Short Term Goal 1 (Week 1): patient will complete bed mobility with min A OT Short Term Goal 2 (Week 1): patient will tolerate adl w/c level with min A using ADs OT Short Term Goal 3 (Week 1): patient will complete functional transfers with CG/min A using RW  Skilled Therapeutic Interventions/Progress Updates:    Pt in bed with HOB elevated working on breakfast to start session.  She was able to complete supine to sit with HOB lowered down and mod facilitation.  She then needed max assist for donning her TLSO with min assist for toilet transfer using the RW for support, to the bathroom.  She needed max assist for toilet hygiene and clothing management with min assist for transfer over to the sink for bathing and dressing.  She was able to doff her TLSO with min assist and mod instructional cueing.  She then doffed her pullover gown with min assist.  UB bathing was completed with supervision with min assist for donning her next pullover gown.  She then needed supervision for washing her legs with use of the LH sponge for washing her feet and the reacher for drying them.  Therapist assisted with donning her gripper socks secondary to decreased time.  Finished session with pt in the wheelchair sitting at bedside with call button in reach and safety alarm belt in place.    Therapy Documentation Precautions:  Precautions Precautions: Back, Fall Required Braces or Orthoses: Spinal Brace Spinal Brace: Thoracolumbosacral orthotic, Applied in sitting position, Other (comment) Spinal Brace Comments: TLSO when OOB Restrictions Weight Bearing Restrictions: No RLE Weight Bearing: Weight bearing as tolerated LLE Weight Bearing: Weight bearing as  tolerated  Pain: Pain Assessment Pain Scale: 0-10 Pain Score: 3  Pain Type: Acute pain Pain Location: Back Pain Orientation: Mid Pain Descriptors / Indicators: Discomfort Pain Onset: With Activity Pain Intervention(s): Repositioned Multiple Pain Sites: No ADL: See Care Tool Section for some details of mobility and selfcare  Therapy/Group: Individual Therapy  Ceniyah Thorp OTR/L 02/15/2020, 9:00 AM

## 2020-02-15 NOTE — Progress Notes (Signed)
Physical Therapy Session Note  Patient Details  Name: Crystal Ashley MRN: [id number] Date of Birth: Mar 24, 1946  Today's Date: 02/15/2020 PT Individual Time: 0151-0255 PT Individual Time Calculation (min): 64 min   Short Term Goals: Week 1:  PT Short Term Goal 1 (Week 1): = to LTGs based on ELOS  Skilled Therapeutic Interventions/Progress Updates:    Pt received supine in bed with her son present and with encouragement pt agreeable to therapy session. Pt's son stayed throughout session as pt appears to understand her native language better than when therapist speaks [place]. NT present assessing vitals: BP 111/52 (MAP 71), HR 123bpm, SpO2 98% Supine>sitting L EOB via logroll technique with mod assist for trunk upright and moving pelvis with cuing for sequencing - continues to demonstrates some wheezing after this task requiring cuing to breath during the movement. Donned TLSO sitting EOB total assist. Required prolonged seated rest break prior to initiating transfer. L stand pivot to w/c using RW with min assist for lifting into standing and balance while turning - cuing for sequencing and improved AD management. Pt reports "dizziness" reassessed vitals: BP 142/93 (MAP 108), HR 59bpm  Transported to/from gym in w/c for time management and energy conservation. Pt reports increased pain - RN notified and present for medication administration. Pt continues to require prolonged seated rest breaks between tasks due to fatigue. Sit<>stands w/c<>RW with heavy min assist for lifting into standing and balance once in standing (leaning in any direction, laterally, forward, backwards) - max cuing for 1/1 technique (1 hand on RW 1 hand pushing from armrest) and increased hip/knee extension to come to stand due to delayed movement. Gait training ~32ft x2 using RW with min assist for balance - therapist facilitating forward movement of RW during ambulation - demonstrates decreased L LE step length compared to R but  overall slow gait speed and slight crouched posture throughout. Reported dizzines after 1st walk vitals assessed: BP 130/74 (MAP 90), HR 59bpm Pt's son reports their stairs are actually 6" height and confirms they have only R HR - therapist educated pt's son that pt used bilateral HRs during stair navigation at eval. Pt stepped up/down 1st step (6" height) using B HRs x5 reps with min assist for balance - demonstrates improved balance when stepping up leading with L LE compared to R. Ascended/descended 3 (6" height) steps using B HRs with step-to pattern - ascended forward, descended backwards. Pt, son, and therapist discussed various stair navigation techniques of side stepping using only their current R HR versus having 2nd HR installed and ascending forward/descending backwards - son planning to look into installing 2nd HR. With encouragement pt agreeable to perform a 3rd trial of gait training ~6ft - continues to require min assist and demonstrate above gait impairments. While transporting pt back to room in w/c pt has small amount of clear/light yellow emesis - RN notified and pt/family reports she has not yet eaten lunch. RN provided ginger ale and therapist encouraged pt to remain seated in w/c until nausea fully passed and until after she completed her lunch - pt in agreement. Vitals reassessed: BP 147/91 (MAP 107), HR 74bpm, SpO2 99% Pt left seated in w/c with needs in reach, TLSO donned, son present, and seat belt alarm on - pt reports nausea improving.   Therapy Documentation Precautions:  Precautions Precautions: Back, Fall Required Braces or Orthoses: Spinal Brace Spinal Brace: Thoracolumbosacral orthotic, Applied in sitting position, Other (comment) Spinal Brace Comments: TLSO when OOB Restrictions Weight Bearing Restrictions:  No RLE Weight Bearing: Weight bearing as tolerated LLE Weight Bearing: Weight bearing as tolerated  Pain:   Reports "a little" pain during session - RN provided  medications - therapist provided seated rest breaks for pain management.   Therapy/Group: Individual Therapy  Tawana Scale , PT, DPT, CSRS 02/15/2020, 1:02 PM

## 2020-02-16 ENCOUNTER — Inpatient Hospital Stay (HOSPITAL_COMMUNITY): Payer: Medicare Other | Admitting: Physical Therapy

## 2020-02-16 ENCOUNTER — Inpatient Hospital Stay (HOSPITAL_COMMUNITY): Payer: Medicare Other | Admitting: Occupational Therapy

## 2020-02-16 LAB — GLUCOSE, CAPILLARY
Glucose-Capillary: 138 mg/dL — ABNORMAL HIGH (ref 70–99)
Glucose-Capillary: 225 mg/dL — ABNORMAL HIGH (ref 70–99)
Glucose-Capillary: 177 mg/dL — ABNORMAL HIGH (ref 70–99)
Glucose-Capillary: 180 mg/dL — ABNORMAL HIGH (ref 70–99)

## 2020-02-16 NOTE — Progress Notes (Signed)
Physical Therapy Session Note  Patient Details  Name: Crystal Ashley MRN: [id number] Date of Birth: 08-04-1946  Today's Date: 02/16/2020 PT Individual Time: 3009-2330 and 0762-2633 PT Individual Time Calculation (min): 68 min and 62 min  Short Term Goals: Week 1:  PT Short Term Goal 1 (Week 1): = to LTGs based on ELOS  Skilled Therapeutic Interventions/Progress Updates:    Session 1: Pt received supine in bed and agreeable to therapy session. Supine>sitting L EOB via logroll technique with pt requiring 2nd attempt prior to getting upright as pt had increased pain then therapist provided increased mod assist for rolling then bringing trunk upright - pt continues to have labored breathing after performing bed mobility with some wheezing. Sitting EOB with supervision donned TLSO total assist. L stand pivot to w/c with min assist for lifting into standing and pt having mild anterior lean pushing RW forward requiring total assist to stop walker from going too far but once balance recovered able to complete transfer with min assist. Pt's brief noted to be falling off so performed sit<>stands at sink with min assist while therapist performed total assist LB clothing management and peri-care for hygiene - standing at sink pt had moderate R lateral hip/trunk lean requiring min/mod assist to return to midline - pt's standing endurance at sink impaired requiring 2x seated rest breaks prior to completing task. Transported to/from gym in w/c for time management and energy conservation. Therapist retrieved pt a hemi-height w/c and youth height RW for improved fit as pt has significant difficulty scooting back in standard height w/c and poor fit with normal height RW. Pt's son arriving for therapy session and therapist educated him on improved pt fit using shorter DME. Gait training 18ft x3 using RW (seated breaks between) with CGA for steadying - continues to demonstrate very slow gait speed with decreased B LE step  lengths and a few standing rest breaks. Standing balance and B LE strengthening task via alternate B LE foot taps on 6" step using RW support - min assist for balance and pt demonstrating difficulty lifting L LE onto step compared to RLE. Transported back to room and pt agreeable to remain sitting up in wheelchair until next therapy session in 1 hour.  Pt left w/ needs in reach, seat belt alarm on, and her son present.  Session 2: Pt received asleep, supine in bed and upon awakening agreeable to therapy session. Supine>sitting L EOB via logroll with mod assist for rolling and trunk upright - continue cuing for breathing as pt has labored breathing and slight wheezing with bed mobility due to great effort to move and increased pain. Sitting EOB required mod assist for balance due to repeated posterior LOB. Sitting EOB donned TLSO total assist with min/mod assist for trunk control due to posterior lean. NT present to assess vitals - agreed for pt to transfer to w/c prior. Initiated sit>stand from EOB when noted pt incontinent of BM. Sit<>stands EOB<>RW with CGA/min assist and min assist for balance due to pt pushing AD too far forward while +2 assist performed total assist peri-care and LB clothing management. L stand pivot to w/c using RW with min assist for balance and AD management. Pt's daughter-in-law arrived and present for remainder of session. Transported to/from gym in w/c for time management and energy conservation. Gait training 27ft x2, seated break between, using RW with CGA/min assist for balance - continues to demonstrate very slow gait speed, intermittent brief standing rest breaks, and slight crouched posture  with increased anterior trunk flexion. Sit<>stands in // bars with CGA as pt has more UE support on bars compared to RW. Lateral side stepping in // bars with B UE support and CGA for steadying - cuing for decreased WBing down through UEs and improved upright trunk posture. Transported back to  room and pt requesting to return to bed. L stand pivot w/c>EOB using UE support on bedrail with min assist for lifting and balance. Doffed TLSO total assist. Sit>supine with mod assist for B LE management via reverse logroll technique with mod cuing for sequencing. Pt left supine in bed with needs in reach, bed alarm on, and family present.   Therapy Documentation Precautions:  Precautions Precautions: Back, Fall Required Braces or Orthoses: Spinal Brace Spinal Brace: Thoracolumbosacral orthotic, Applied in sitting position, Other (comment) Spinal Brace Comments: TLSO when OOB Restrictions Weight Bearing Restrictions: No RLE Weight Bearing: Weight bearing as tolerated LLE Weight Bearing: Weight bearing as tolerated  Pain:   Session 1: Reports she recently had tylenol and denies additional medication administration - therapist provided frequent seated rest breaks for pain management.  Session 2: Continues to report back pain with mobility, unrated, but declines requesting medication - therapist provides frequent seated rest breaks for pain management.     Therapy/Group: Individual Therapy  Tawana Scale, PT, DPT, CSRS  02/16/2020, 7:50 AM

## 2020-02-16 NOTE — Progress Notes (Signed)
Occupational Therapy Session Note  Patient Details  Name: Crystal Ashley MRN: [id number] Date of Birth: 03-14-1946  Today's Date: 02/16/2020 OT Individual Time: 1006-1103 OT Individual Time Calculation (min): 57 min    Short Term Goals: Week 1:  OT Short Term Goal 1 (Week 1): patient will complete bed mobility with min A OT Short Term Goal 2 (Week 1): patient will tolerate adl w/c level with min A using ADs OT Short Term Goal 3 (Week 1): patient will complete functional transfers with CG/min A using RW  Skilled Therapeutic Interventions/Progress Updates:    Pt sitting up in the wheelchair to start session.  Had her work on Chemical engineer socks with AE during session.  Also attempted donning slip on shoes that her son brought with use of the reacher and the shoe funnel, however the shoes were still too small.  She needed mod instructional cueing with min facilitation to use the reacher and doff her socks.  Mod instructional cueing with min assist was needed for donning her gripper socks with use of the sockaide.  She needed increased time for initiation and completion of all tasks.  Finished session with transfer back to the bed with mod assist using the RW for support.  She was able to remove the TLSO with supervision but needed max assist for transition to supine from sitting.  She was left in the bed with bed alarm in place and call button and phone in reach.    Therapy Documentation Precautions:  Precautions Precautions: Back, Fall Required Braces or Orthoses: Spinal Brace Spinal Brace: Thoracolumbosacral orthotic, Applied in sitting position, Other (comment) Spinal Brace Comments: TLSO when OOB Restrictions Weight Bearing Restrictions: No RLE Weight Bearing: Weight bearing as tolerated LLE Weight Bearing: Weight bearing as tolerated  Pain: Pain Assessment Pain Scale: 0-10 Pain Score: 5  Faces Pain Scale: Hurts a little bit Pain Type: Acute pain Pain Location:  Back Pain Orientation: Mid Pain Descriptors / Indicators: Discomfort ADL: See Care Tool Section for some details of mobility and selfcare  Therapy/Group: Individual Therapy  Geena Weinhold OTR/L 02/16/2020, 12:15 PM

## 2020-02-16 NOTE — Progress Notes (Signed)
[place] PHYSICAL MEDICINE & REHABILITATION PROGRESS NOTE   Subjective/Complaints:   Resting on bed , poor sleep due to mid back pain , pt not sure what meds she took last noc .  No breathing or bowel issues   ROS: No CP, SOB. N/V/D  Objective:   No results found. No results for input(s): WBC, HGB, HCT, PLT in the last 72 hours. No results for input(s): NA, K, CL, CO2, GLUCOSE, BUN, CREATININE, CALCIUM in the last 72 hours.  Intake/Output Summary (Last 24 hours) at 02/16/2020 0649 Last data filed at 02/16/2020 0545 Gross per 24 hour  Intake 580 ml  Output 650 ml  Net -70 ml     Physical Exam: Vital Signs Blood pressure (!) 154/80, pulse (!) 59, temperature 97.8 F (36.6 C), temperature source Oral, resp. rate 20, height 5' (1.524 m), weight 90.9 kg, SpO2 95 %.    General: No acute distress Mood and affect are appropriate Heart: Regular rate and rhythm no rubs murmurs or extra sounds Lungs: Clear to auscultation, breathing unlabored, no rales or wheezes Abdomen: Positive bowel sounds, soft nontender to palpation, nondistended Extremities: No clubbing, cyanosis, or edema Skin: No evidence of breakdown, no evidence of rash   Musculoskeletal: Full range of motion in all 4 extremities. No joint swelling  Psych: Pt's affect is appropriate. Pt is cooperative  Assessment/Plan: 1. Functional deficits secondary to multiple fractures following fall, which require 3+ hours per day of interdisciplinary therapy in a comprehensive inpatient rehab setting.  Physiatrist is providing close team supervision and 24 hour management of active medical problems listed below.  Physiatrist and rehab team continue to assess barriers to discharge/monitor patient progress toward functional and medical goals  Care Tool:  Bathing    Body parts bathed by patient: Right arm, Left arm, Chest, Abdomen, Right upper leg, Left upper leg, Face, Front perineal area, Right lower leg, Left lower leg    Body parts bathed by helper: Buttocks     Bathing assist Assist Level: Moderate Assistance - Patient 50 - 74%     Upper Body Dressing/Undressing Upper body dressing Upper body dressing/undressing activity did not occur (including orthotics): N/A What is the patient wearing?: Dress    Upper body assist Assist Level: Minimal Assistance - Patient > 75%    Lower Body Dressing/Undressing Lower body dressing      What is the patient wearing?: Incontinence brief     Lower body assist Assist for lower body dressing: Maximal Assistance - Patient 25 - 49%     Toileting Toileting    Toileting assist Assist for toileting: Maximal Assistance - Patient 25 - 49%     Transfers Chair/bed transfer  Transfers assist     Chair/bed transfer assist level: Minimal Assistance - Patient > 75% Chair/bed transfer assistive device: Programmer, multimedia   Ambulation assist      Assist level: Minimal Assistance - Patient > 75% Assistive device: Walker-rolling Max distance: 20ft   Walk 10 feet activity   Assist  Walk 10 feet activity did not occur: Safety/medical concerns (pt not safe to ambulate further without AD)  Assist level: Minimal Assistance - Patient > 75% Assistive device: Walker-rolling   Walk 50 feet activity   Assist Walk 50 feet with 2 turns activity did not occur: Safety/medical concerns         Walk 150 feet activity   Assist Walk 150 feet activity did not occur: Safety/medical concerns  Walk 10 feet on uneven surface  activity   Assist Walk 10 feet on uneven surfaces activity did not occur: Safety/medical concerns         Wheelchair     Assist Will patient use wheelchair at discharge?: No (if used w/c would be for dependent community transport)             Wheelchair 50 feet with 2 turns activity    Assist            Wheelchair 150 feet activity     Assist          Blood pressure (!) 154/80,  pulse (!) 59, temperature 97.8 F (36.6 C), temperature source Oral, resp. rate 20, height 5' (1.524 m), weight 90.9 kg, SpO2 95 %.  Medical Problem List and Plan: 1.  Impaired mobility and ADLs secondary to multiple traumatic injuries following fall, possibly secondary to starting Cardizem.              CIR PT, OT             -ELOS/Goals: 10-14 days modI PT, OT, I in SLP   2.  Antithrombotics: -DVT/anticoagulation:  Pharmaceutical: Xarelto , Hgb stable             -antiplatelet therapy:N/a 3. Pain Management:no pain at present per nsg not asking for pain medication often.but complained that pain kept her awake last noc  Ask nsg to make sure pt takes at least tramadol at bedtime  Continue robaxin tid with tylenol qid (decrease to 650 mg). Better controlled with oxycodone.  4. Mood: Team to provide ego support. LCSW to follow for evaluation and support.              -antipsychotic agents: N/A 5. Neuropsych: This patient is capable of making decisions on her own behalf. 6. Skin/Wound Care: Routine pressure relief measures.  7. Fluids/Electrolytes/Nutrition: Monitor I/O. Check lytes in am.  8. T10/T11 fractures: TLSO when at edge of bed and out of bed. Encouraged to wear in bed for support comfort if tolerated.  9. Right pelvic fracture: WBAT 10. T2DM: Hgb A1c- 6.8. will monitor BS ac/hs. Resume metformin once intake improves.  6/29: CBGs poorly controled. Will monitor with increased mobility.  CBG (last 3)  Recent Labs    02/15/20 1134 02/15/20 1636 02/15/20 2101  GLUCAP 209* 144* 146*  some lability but mainly in range   11. PAF: Monitor HR tid--continue Xarelto, Cardizem and Metoprolol XL daily.  Vitals:   02/16/20 0502 02/16/20 0504  BP: (!) 147/72 (!) 154/80  Pulse: (!) 56 (!) 59  Resp: 20   Temp: 97.8 F (36.6 C)   SpO2: 99% 95%   Rate ok on low side will increase diltiazem today and monitor if pt drops< 50 will need to reduce BB 12. ABLA: Due to fracture/hematoma. Stable  at 9.5 13. Leucocytosis: Monitor for signs of infection--appears to be resolving.  14. Vitamin D deficiency: was on supplement at home. Will add Vitamin D level to 6/29 labs.  6/29: Level is normal. Will not restart supplement.  15. Vitamin B12 deficiency: was on supplement at home. Will add Vitamin B level to 6/29 labs.   6/29: Level is normal. Will not restart supplement.  15. Disposition: Patient lives with her son and his fiance.       LOS: 4 days A FACE TO FACE EVALUATION WAS PERFORMED  Charlett Blake 02/16/2020, 6:49 AM

## 2020-02-17 LAB — GLUCOSE, CAPILLARY
Glucose-Capillary: 154 mg/dL — ABNORMAL HIGH (ref 70–99)
Glucose-Capillary: 135 mg/dL — ABNORMAL HIGH (ref 70–99)
Glucose-Capillary: 205 mg/dL — ABNORMAL HIGH (ref 70–99)
Glucose-Capillary: 113 mg/dL — ABNORMAL HIGH (ref 70–99)

## 2020-02-17 NOTE — Progress Notes (Deleted)
Per grand daughter  Patient's daughter has done return demo re: lovenox shots; no further questions noted.Grand daughter says she will be here next week to observe lovenox shot injection.

## 2020-02-17 NOTE — Plan of Care (Signed)
°  Problem: Consults Goal: RH GENERAL PATIENT EDUCATION Description: See Patient Education module for education specifics. Outcome: Progressing Goal: Skin Care Protocol Initiated - if Braden Score 18 or less Description: If consults are not indicated, leave blank or document N/A Outcome: Progressing   Problem: RH BLADDER ELIMINATION Goal: RH STG MANAGE BLADDER WITH ASSISTANCE Description: STG Manage Bladder With mod I Assistance Outcome: Progressing   Problem: RH SKIN INTEGRITY Goal: RH STG SKIN FREE OF INFECTION/BREAKDOWN Description: Patients skin will remain free from further infection or breakdown with min assist.  Outcome: Progressing Goal: RH STG MAINTAIN SKIN INTEGRITY WITH ASSISTANCE Description: STG Maintain Skin Integrity With min Assistance. Outcome: Progressing   Problem: RH SAFETY Goal: RH STG ADHERE TO SAFETY PRECAUTIONS W/ASSISTANCE/DEVICE Description: STG Adhere to Safety Precautions With mod I Assistance/Device. Outcome: Progressing   Problem: RH PAIN MANAGEMENT Goal: RH STG PAIN MANAGED AT OR BELOW PT'S PAIN GOAL Description: < 4 Outcome: Progressing

## 2020-02-18 ENCOUNTER — Inpatient Hospital Stay (HOSPITAL_COMMUNITY): Payer: Medicare Other

## 2020-02-18 ENCOUNTER — Inpatient Hospital Stay (HOSPITAL_COMMUNITY): Payer: Medicare Other | Admitting: Occupational Therapy

## 2020-02-18 LAB — GLUCOSE, CAPILLARY
Glucose-Capillary: 130 mg/dL — ABNORMAL HIGH (ref 70–99)
Glucose-Capillary: 158 mg/dL — ABNORMAL HIGH (ref 70–99)
Glucose-Capillary: 147 mg/dL — ABNORMAL HIGH (ref 70–99)
Glucose-Capillary: 143 mg/dL — ABNORMAL HIGH (ref 70–99)

## 2020-02-18 NOTE — Progress Notes (Signed)
Patient refuses to raise [facility] claims it hurts her back offered pain meds but refused; Kpad ordered but not avail per portable devices but she's on the list. Will follow.

## 2020-02-18 NOTE — Progress Notes (Signed)
Son requesting scheduled tylenol for his mother so she can participate with therapy sessions. We'll report to incoming RN

## 2020-02-18 NOTE — Progress Notes (Signed)
Occupational Therapy Session Note  Patient Details  Name: Crystal Ashley MRN: [id number] Date of Birth: 02/14/1946  Today's Date: 02/18/2020 OT Individual Time: 3559-7416 and 3845-3646 OT Individual Time Calculation (min): 56 min and 24 min   Short Term Goals: Week 1:  OT Short Term Goal 1 (Week 1): patient will complete bed mobility with min A OT Short Term Goal 2 (Week 1): patient will tolerate adl w/c level with min A using ADs OT Short Term Goal 3 (Week 1): patient will complete functional transfers with CG/min A using RW  Skilled Therapeutic Interventions/Progress Updates:    Pt greeted at time of session supine in bed resting but agreeable to OT session. Bed mobility supine to sitting up at EOB with Mod A to primarily manage UB into an upright position and once sitting at EOB pt became very SOB and with pursed lip breathing O2 sats increased from 82% to 94%. Pt noted to have poor sitting balance/core strength requiring UE support to sit, static sitting at EOB until stable enough to perform oral hygiene, grooming, and face washing while seated at EOB. Donned TLSO with Max A seated EOB, fatigue limiting. Reviewed Lb dressing using sock aide, which pt performed with Min A. Pt expressed need to use the bathroom, sit to stand from elevated bed surface Mod-Max A and attempted to pivot feet but unable and returned to sitting. Max A stand pivot with therapist assist with Max A but poor positioning noted on BSC and pt transferred back to bed in the same manner for nursing to perform toileting with bed pan. Shorter drop arm BSC provided for future sessions to be a better fit. Pt reclined in bed with Max A to return to supine and alarm on, call bell in reach, all needs met .   Session 2: Pt greeted at time of session supine in bed with HOB elevated, finished eating lunch. Attempted to transition to sitting up at EOB but stated too painful for her back. Practiced rolling L and R with Mod/Max A with  instructions to use bed rails for assistance. Positioned in side lying with pillow for support for pressure relief and offload her back. AROM techniques for BUEs at all joints/planes for movement and decrease risk of stiffness. Pt's son entered room at end of session and OT shared pt's performance today with bed mob, standing, cardio, and transfers. Pt left supine in bed with HOB elevated, call bell in reach, alarm on, all needs met.   Therapy Documentation Precautions:  Precautions Precautions: Back, Fall Required Braces or Orthoses: Spinal Brace Spinal Brace: Thoracolumbosacral orthotic, Applied in sitting position, Other (comment) Spinal Brace Comments: TLSO when OOB Restrictions Weight Bearing Restrictions: Yes RLE Weight Bearing: Weight bearing as tolerated LLE Weight Bearing: Weight bearing as tolerated    Therapy/Group: Individual Therapy  Viona Gilmore 02/18/2020, 12:21 PM

## 2020-02-18 NOTE — Progress Notes (Signed)
Physical Therapy Session Note  Patient Details  Name: Crystal Ashley MRN: [id number] Date of Birth: [birth year]  Today's Date: 02/18/2020 PT Individual Time: 8127-5170 PT Individual Time Calculation (min): 62 min   Short Term Goals: Week 1:  PT Short Term Goal 1 (Week 1): = to LTGs based on ELOS  Skilled Therapeutic Interventions/Progress Updates:    Pt presents in bed, agreeable to therapy session but reports she did not sleep well last night and is having a lot of pain this morning. Rates it as a 5/10 and reports premedicated. Pt requires significant amount of extra time and multimodal cues to initiate bed mobility requiring ultimately max assist after several attempts in various techniques. Once EOB pt able to maintain balance with supervision and requires total assist for donning of TLSO after assist with changing of gown. Pt requires significant encouragement to remove or don new gown, asking therapist to do it for her. Multiple attempts for sit to stands from EOB with RW with heavy mod assist and difficulty achieving upright position. Once up noted that patient did have incontinent BM in brief (pt denied need for use of bathroom when asked earlier and was unaware that she had had a BM). Completed the transfer to the w/c with mod assist with RW. +2 assist needed for attempts for sit <> stands to perform hygiene but pt unable to maintain upright position long enough to perform brief change or hygiene. Ultimately required total +2 squat pivot back to bed and max assist for rolling to perform change and hygiene. Repeated cues needed for sequencing and follow through. Supine therex for functional strengthening x 4 reps each side with max cues to continue activity for heel raises with focus on use of breath for exhale on exertion with mobility. Pt was wheezing at times and became SOB at initial EOB - RN aware reporting this has been occurring.   Therapy Documentation Precautions:   Precautions Precautions: Back, Fall Required Braces or Orthoses: Spinal Brace Spinal Brace: Thoracolumbosacral orthotic, Applied in sitting position, Other (comment) Spinal Brace Comments: TLSO when OOB Restrictions Weight Bearing Restrictions: Yes RLE Weight Bearing: Weight bearing as tolerated LLE Weight Bearing: Weight bearing as tolerated   Therapy/Group: Individual Therapy  Canary Brim Ivory Broad, PT, DPT, CBIS  02/18/2020, 10:30 AM

## 2020-02-18 NOTE — Progress Notes (Signed)
[facility] PHYSICAL MEDICINE & REHABILITATION PROGRESS NOTE   Subjective/Complaints:  Nursing note indicates teaching for enoxaparin shot, pt not on this No new issues pain control is good at rest fair with activity (4/10 with PT)  ROS: No CP, SOB. N/V/D  Objective:   No results found. No results for input(s): WBC, HGB, HCT, PLT in the last 72 hours. No results for input(s): NA, K, CL, CO2, GLUCOSE, BUN, CREATININE, CALCIUM in the last 72 hours.  Intake/Output Summary (Last 24 hours) at 02/18/2020 1020 Last data filed at 02/18/2020 0736 Gross per 24 hour  Intake 476 ml  Output 500 ml  Net -24 ml     Physical Exam: Vital Signs Blood pressure (!) 129/93, pulse 81, temperature 98.2 F (36.8 C), resp. rate 17, height 5' (1.524 m), weight 94.5 kg, SpO2 96 %.    General: No acute distress Mood and affect are appropriate Heart: Regular rate and rhythm no rubs murmurs or extra sounds Lungs: Clear to auscultation, breathing unlabored, no rales or wheezes Abdomen: Positive bowel sounds, soft nontender to palpation, nondistended Extremities: No clubbing, cyanosis, or edema Skin: No evidence of breakdown, no evidence of rash   Musculoskeletal: Full range of motion in all 4 extremities. No joint swelling  Psych: Pt's affect is appropriate. Pt is cooperative  Assessment/Plan: 1. Functional deficits secondary to multiple fractures following fall, which require 3+ hours per day of interdisciplinary therapy in a comprehensive inpatient rehab setting.  Physiatrist is providing close team supervision and 24 hour management of active medical problems listed below.  Physiatrist and rehab team continue to assess barriers to discharge/monitor patient progress toward functional and medical goals  Care Tool:  Bathing    Body parts bathed by patient: Right arm, Left arm, Chest, Abdomen, Right upper leg, Left upper leg, Face, Front perineal area, Right lower leg, Left lower leg   Body parts  bathed by helper: Buttocks     Bathing assist Assist Level: Moderate Assistance - Patient 50 - 74%     Upper Body Dressing/Undressing Upper body dressing Upper body dressing/undressing activity did not occur (including orthotics): N/A What is the patient wearing?: Dress    Upper body assist Assist Level: Minimal Assistance - Patient > 75%    Lower Body Dressing/Undressing Lower body dressing      What is the patient wearing?: Incontinence brief     Lower body assist Assist for lower body dressing: Maximal Assistance - Patient 25 - 49%     Toileting Toileting    Toileting assist Assist for toileting: Maximal Assistance - Patient 25 - 49%     Transfers Chair/bed transfer  Transfers assist     Chair/bed transfer assist level: Minimal Assistance - Patient > 75% Chair/bed transfer assistive device: Programmer, multimedia   Ambulation assist      Assist level: Minimal Assistance - Patient > 75% Assistive device: Walker-rolling Max distance: 34ft   Walk 10 feet activity   Assist  Walk 10 feet activity did not occur: Safety/medical concerns (pt not safe to ambulate further without AD)  Assist level: Minimal Assistance - Patient > 75% Assistive device: Walker-rolling   Walk 50 feet activity   Assist Walk 50 feet with 2 turns activity did not occur: Safety/medical concerns  Assist level: Minimal Assistance - Patient > 75% Assistive device: Walker-rolling    Walk 150 feet activity   Assist Walk 150 feet activity did not occur: Safety/medical concerns  Walk 10 feet on uneven surface  activity   Assist Walk 10 feet on uneven surfaces activity did not occur: Safety/medical concerns         Wheelchair     Assist Will patient use wheelchair at discharge?: No (if used w/c would be for dependent community transport)             Wheelchair 50 feet with 2 turns activity    Assist            Wheelchair 150 feet  activity     Assist          Blood pressure (!) 129/93, pulse 81, temperature 98.2 F (36.8 C), resp. rate 17, height 5' (1.524 m), weight 94.5 kg, SpO2 96 %.  Medical Problem List and Plan: 1.  Impaired mobility and ADLs secondary to multiple traumatic injuries following fall, possibly secondary to starting Cardizem.              CIR PT, OT             -ELOS/Goals: 10-14 days modI PT, OT, I in SLP   2.  Antithrombotics: -DVT/anticoagulation:  Pharmaceutical: Xarelto , Hgb stable recheck in am              -antiplatelet therapy:N/a 3. Pain Management:no pain at present per nsg not asking for pain medication often.but complained that pain kept her awake last noc  Ask nsg to make sure pt takes at least tramadol at bedtime  Continue robaxin tid with tylenol qid (decrease to 650 mg). Better controlled with oxycodone.  4. Mood: Team to provide ego support. LCSW to follow for evaluation and support.              -antipsychotic agents: N/A 5. Neuropsych: This patient is capable of making decisions on her own behalf. 6. Skin/Wound Care: Routine pressure relief measures.  7. Fluids/Electrolytes/Nutrition: Monitor I/O. Check lytes in am.  8. T10/T11 fractures: TLSO when at edge of bed and out of bed. Encouraged to wear in bed for support comfort if tolerated.  9. Right pelvic fracture: WBAT 10. T2DM: Hgb A1c- 6.8. will monitor BS ac/hs. Resume metformin once intake improves.  6/29: CBGs poorly controled. Will monitor with increased mobility.  CBG (last 3)  Recent Labs    02/17/20 1726 02/17/20 2130 02/18/20 0624  GLUCAP 205* 113* 130*  some lability but mainly in range   11. PAF: Monitor HR tid--continue Xarelto, Cardizem and Metoprolol XL daily.  Vitals:   02/17/20 2018 02/18/20 0350  BP: 130/68 (!) 129/93  Pulse: (!) 58 81  Resp: 18 17  Temp: 98.1 F (36.7 C) 98.2 F (36.8 C)  SpO2: 95% 96%  HTN improved some borderline low HR but asymptomatic  12. ABLA: Due to  fracture/hematoma. Stable at 9.5 13. Leucocytosis: Monitor for signs of infection--appears to be resolving.  14. Vitamin D deficiency: was on supplement at home. Will add Vitamin D level to 6/29 labs.  6/29: Level is normal. Will not restart supplement.  15. Vitamin B12 deficiency: was on supplement at home. Will add Vitamin B level to 6/29 labs.   6/29: Level is normal. Will not restart supplement.  15. Disposition: Patient lives with her son and his fiance.       LOS: 6 days A FACE TO FACE EVALUATION WAS PERFORMED  Charlett Blake 02/18/2020, 10:20 AM

## 2020-02-18 NOTE — Progress Notes (Signed)
Physical Therapy Session Note  Patient Details  Name: Crystal Ashley MRN: [id number] Date of Birth: September 18, 1945  Today's Date: 02/18/2020 PT Individual Time: 7353-2992 PT Individual Time Calculation (min): 24 min  and Today's Date: 02/18/2020 PT Missed Time: 21 Minutes Missed Time Reason: Patient unwilling to participate;Pain  Short Term Goals: Week 1:  PT Short Term Goal 1 (Week 1): = to LTGs based on ELOS  Skilled Therapeutic Interventions/Progress Updates:    Pt presents in bed with son at bedside. Shared information with son about session this morning. Pt initially verbally agreeable to attempt OOB. Extra time provided and encouragement. Unable to attempt due to limitations due to pain. Declines pain medication at this time. Mod/max assist for rolling to reposition and off load for pressure relief. Ice pack applied to lower back area with pt semi turned to the her L now. Educated on son and patient on importance of continued movement and pressure relief if she is not getting out of bed. Also encouraged her to rest as she attributes this change in functional status to not resting last night. Will need to follow up with PA and MD if this continues to present tomorrow after getting some rest. Pt unable to further participate in session so missed skilled therapy time.   Therapy Documentation Precautions:  Precautions Precautions: Back, Fall Required Braces or Orthoses: Spinal Brace Spinal Brace: Thoracolumbosacral orthotic, Applied in sitting position, Other (comment) Spinal Brace Comments: TLSO when OOB Restrictions Weight Bearing Restrictions: Yes RLE Weight Bearing: Weight bearing as tolerated LLE Weight Bearing: Weight bearing as tolerated General: PT Amount of Missed Time (min): 21 Minutes PT Missed Treatment Reason: Patient unwilling to participate;Pain Vital Signs:  Pain:  did not rate, but pain was reason patient states she could not attempt OOB this PM. Repositioned and  applied ice pack at end of session. Declines pain medication.    Therapy/Group: Individual Therapy  Canary Brim [facility] 02/18/2020, 2:10 PM

## 2020-02-18 NOTE — Plan of Care (Signed)
°  Problem: RH BLADDER ELIMINATION Goal: RH STG MANAGE BLADDER WITH ASSISTANCE Description: STG Manage Bladder With mod I Assistance Outcome: Not Progressing; pvr ; retention

## 2020-02-19 ENCOUNTER — Inpatient Hospital Stay (HOSPITAL_COMMUNITY): Payer: Medicare Other

## 2020-02-19 ENCOUNTER — Inpatient Hospital Stay (HOSPITAL_COMMUNITY): Payer: Medicare Other | Admitting: Occupational Therapy

## 2020-02-19 LAB — BASIC METABOLIC PANEL
Anion gap: 10 (ref 5–15)
BUN: 14 mg/dL (ref 8–23)
CO2: 28 mmol/L (ref 22–32)
Calcium: 8.5 mg/dL — ABNORMAL LOW (ref 8.9–10.3)
Chloride: 103 mmol/L (ref 98–111)
Creatinine, Ser: 0.93 mg/dL (ref 0.44–1.00)
GFR calc Af Amer: >60 mL/min (ref >60)
GFR calc non Af Amer: >60 mL/min (ref >60)
Glucose, Bld: 146 mg/dL — ABNORMAL HIGH (ref 70–99)
Potassium: 3.8 mmol/L (ref 3.5–5.1)
Sodium: 141 mmol/L (ref 135–145)

## 2020-02-19 LAB — GLUCOSE, CAPILLARY
Glucose-Capillary: 142 mg/dL — ABNORMAL HIGH (ref 70–99)
Glucose-Capillary: 166 mg/dL — ABNORMAL HIGH (ref 70–99)
Glucose-Capillary: 142 mg/dL — ABNORMAL HIGH (ref 70–99)
Glucose-Capillary: 159 mg/dL — ABNORMAL HIGH (ref 70–99)

## 2020-02-19 MED ORDER — RIVAROXABAN 20 MG PO TABS
20.0000 mg | ORAL_TABLET | Freq: Every day | ORAL | Status: DC
  Administered 2020-02-19 – 2020-02-22 (×4): 20 mg via ORAL
  Filled 2020-02-19 (×4): qty 1

## 2020-02-19 MED ORDER — ACETAMINOPHEN 325 MG PO TABS
650.0000 mg | ORAL_TABLET | Freq: Three times a day (TID) | ORAL | Status: DC
  Administered 2020-02-19 – 2020-02-21 (×4): 650 mg via ORAL
  Filled 2020-02-19 (×5): qty 2

## 2020-02-19 MED ORDER — LIDOCAINE 5 % EX PTCH
2.0000 | MEDICATED_PATCH | CUTANEOUS | Status: DC
  Administered 2020-02-19 – 2020-02-25 (×7): 2 via TRANSDERMAL
  Filled 2020-02-19 (×5): qty 2

## 2020-02-19 MED ORDER — TRAMADOL HCL 50 MG PO TABS
50.0000 mg | ORAL_TABLET | Freq: Two times a day (BID) | ORAL | Status: DC
  Administered 2020-02-20: 50 mg via ORAL
  Filled 2020-02-19: qty 1

## 2020-02-19 NOTE — Progress Notes (Signed)
Physical Therapy Session Note 4min missed time in pm due to refusal/pain.  Patient Details  Name: Crystal Ashley MRN: [id number] Date of Birth: 01/14/1946  Today's Date: 02/19/2020 PT Individual Time: 0800-0900  PT Individual Time Calculation (min): 60 min   Short Term Goals: Week 1:  PT Short Term Goal 1 (Week 1): = to LTGs based on ELOS Week 2:    Week 3:     Skilled Therapeutic Interventions/Progress Updates:    AM SESSION: PAIN 7/10 back pain, treatment to tolerance, rest breaks and repositioning as needed.  Pt initially supine and requesting therapist wait 110min for her son to arrive.  Educated pt re: scheduled session and pt then agreed.   Pt attempted supine to sit but c/o increased pain.   Performed LE warm up therex including hip abd/add, heel slides, and clamshells x 10 each. Reviewed spinal precautions which pt does not recall.  Pt then rolls to L w/mod assist, side to sit w/mod assist.   Pt c/o pain in sitting, very slow to scoot to edge w/min assist. Therapist donned TLSO w/pt in sitting.  Attempted STS w/max assist but pt unable.  Attempted lateral scoot but pt unable.  Repeated STS w/RW and pt able to perform w/mod assist, SPT to wc w/mod assist.  Pt transported to commode in BR.  Attempted SPT mult efforts but pt defaults each time citing pain.  Pt then states "I do not need to use to commode anymore."  Pt transported to hall.  Attempted STS in prep for gait pt pt again defaults, leans suddendly back in wc while seated on edge and requires max assist of 2 to reposition in wc.  Pt transported to gym for continued session.  Son arrived and followed to gym. Pt STS in parallel bars w/min assist, additional time, son encouraging mom. Gait 36ft in parallel bars w/min assist, cues for posture, son follows w/wc.  Pt transported back to room by son, therapist assisted w/set up for breakfast and pt left w/son assisting patient, instructed to call for assist/encouraged to  remain OOB in wc until at least 10am OT session.    PM SESSION Patient refused session citing pain as reason.  Stated that I would try back in one hour and pt stated "do not.  I cannot." sons fiancee arrived in pm and asking reason for missed visit.  Spoke w/her at bedside and pt continues to decline.  She requested social workers contact to discuss alternate [employer].  Provided w/contact information.    Therapy Documentation Precautions:  Precautions Precautions: Back, Fall Required Braces or Orthoses: Spinal Brace Spinal Brace: Thoracolumbosacral orthotic, Applied in sitting position, Other (comment) Spinal Brace Comments: TLSO when OOB Restrictions Weight Bearing Restrictions: No RLE Weight Bearing: Weight bearing as tolerated LLE Weight Bearing: Weight bearing as tolerated    Therapy/Group: Individual Therapy  Callie Fielding, [place] 02/19/2020, 12:42 PM

## 2020-02-19 NOTE — Progress Notes (Addendum)
PHYSICAL MEDICINE & REHABILITATION PROGRESS NOTE   Subjective/Complaints: PT at bedside reports that pain has been limiting therapy sessions. Nursing note says that sone has requested scheduled Tylenol. Patient states she feels nauseous with opioids. I have scheduled Tylenol and added Lidocaine patch and kpad.   ROS: Denies CP, SOB. N/V/D  Objective:   No results found. No results for input(s): WBC, HGB, HCT, PLT in the last 72 hours. Recent Labs    02/19/20 0659  NA 141  K 3.8  CL 103  CO2 28  GLUCOSE 146*  BUN 14  CREATININE 0.93  CALCIUM 8.5*    Intake/Output Summary (Last 24 hours) at 02/19/2020 1024 Last data filed at 02/19/2020 0900 Gross per 24 hour  Intake 740 ml  Output 450 ml  Net 290 ml     Physical Exam: Vital Signs Blood pressure (!) 150/82, pulse (!) 51, temperature (!) 97.4 F (36.3 C), temperature source Oral, resp. rate 18, height 5' (1.524 m), weight 89.6 kg, SpO2 98 %.  General: Alert and oriented x 3, No apparent distress HEENT: Head is normocephalic, atraumatic, PERRLA, EOMI, sclera anicteric, oral mucosa pink and moist, dentition intact, ext ear canals clear,  Neck: Supple without JVD or lymphadenopathy Heart: Reg rate and rhythm. No murmurs rubs or gallops Chest: CTA bilaterally without wheezes, rales, or rhonchi; no distress Abdomen: Soft, non-tender, non-distended, bowel sounds positive. Extremities: No clubbing, cyanosis, or edema. Pulses are 2+ Skin: Clean and intact without signs of breakdown Neuro: Pt is cognitively appropriate with normal insight, memory, and awareness. Cranial nerves 2-12 are intact. Sensory exam is normal. Reflexes are 2+ in all 4's. Fine motor coordination is intact. No tremors. Motor function is grossly 5/5.  Musculoskeletal: Full ROM, No pain with AROM or PROM in the neck, trunk, or extremities. Posture appropriate Psych: Pt's affect is appropriate. Pt is cooperative   Assessment/Plan: 1. Functional  deficits secondary to multiple fractures following fall, which require 3+ hours per day of interdisciplinary therapy in a comprehensive inpatient rehab setting.  Physiatrist is providing close team supervision and 24 hour management of active medical problems listed below.  Physiatrist and rehab team continue to assess barriers to discharge/monitor patient progress toward functional and medical goals  Care Tool:  Bathing    Body parts bathed by patient: Right arm, Left arm, Chest, Abdomen, Right upper leg, Left upper leg, Face, Front perineal area, Right lower leg, Left lower leg   Body parts bathed by helper: Buttocks     Bathing assist Assist Level: Moderate Assistance - Patient 50 - 74%     Upper Body Dressing/Undressing Upper body dressing Upper body dressing/undressing activity did not occur (including orthotics): N/A What is the patient wearing?: Dress    Upper body assist Assist Level: Minimal Assistance - Patient > 75%    Lower Body Dressing/Undressing Lower body dressing      What is the patient wearing?: Incontinence brief     Lower body assist Assist for lower body dressing: Maximal Assistance - Patient 25 - 49%     Toileting Toileting    Toileting assist Assist for toileting: Maximal Assistance - Patient 25 - 49%     Transfers Chair/bed transfer  Transfers assist     Chair/bed transfer assist level: Minimal Assistance - Patient > 75% Chair/bed transfer assistive device: Programmer, multimedia   Ambulation assist      Assist level: Minimal Assistance - Patient > 75% Assistive device: Walker-rolling Max distance: 8ft  Walk 10 feet activity   Assist  Walk 10 feet activity did not occur: Safety/medical concerns (pt not safe to ambulate further without AD)  Assist level: Minimal Assistance - Patient > 75% Assistive device: Walker-rolling   Walk 50 feet activity   Assist Walk 50 feet with 2 turns activity did not occur:  Safety/medical concerns  Assist level: Minimal Assistance - Patient > 75% Assistive device: Walker-rolling    Walk 150 feet activity   Assist Walk 150 feet activity did not occur: Safety/medical concerns         Walk 10 feet on uneven surface  activity   Assist Walk 10 feet on uneven surfaces activity did not occur: Safety/medical concerns         Wheelchair     Assist Will patient use wheelchair at discharge?: No (if used w/c would be for dependent community transport)             Wheelchair 50 feet with 2 turns activity    Assist            Wheelchair 150 feet activity     Assist          Blood pressure (!) 150/82, pulse (!) 51, temperature (!) 97.4 F (36.3 C), temperature source Oral, resp. rate 18, height 5' (1.524 m), weight 89.6 kg, SpO2 98 %.  Medical Problem List and Plan: 1.  Impaired mobility and ADLs secondary to multiple traumatic injuries following fall, possibly secondary to starting Cardizem.              CIR PT, OT             -ELOS/Goals: 10-14 days modI PT, OT, I in SLP 2.  Antithrombotics: -DVT/anticoagulation:  Pharmaceutical: Xarelto , Hgb stable              -antiplatelet therapy:N/a 3. Pain Management: Patient feels nauseous with opioids but pain is significantly limiting therapy. Scheduled Tylenol and added lidocaine patch and kpad for lumbar paraspinals where pain is worst. Addendum: She is willing to try Tramadol- will see how she tolerates 4. Mood: Team to provide ego support. LCSW to follow for evaluation and support.              -antipsychotic agents: N/A 5. Neuropsych: This patient is capable of making decisions on her own behalf. 6. Skin/Wound Care: Routine pressure relief measures.  7. Fluids/Electrolytes/Nutrition: Monitor I/O. Check lytes in am.  8. T10/T11 fractures: TLSO when at edge of bed and out of bed. Encouraged to wear in bed for support comfort if tolerated.  9. Right pelvic fracture: WBAT 10.  T2DM: Hgb A1c- 6.8. will monitor BS ac/hs. Resume metformin once intake improves.  6/29: CBGs poorly controled. Will monitor with increased mobility.  CBG (last 3)  Recent Labs    02/18/20 1717 02/18/20 2102 02/19/20 0602  GLUCAP 147* 143* 142*  7/5: well controlled 11. PAF: Monitor HR tid--continue Xarelto, Cardizem and Metoprolol XL daily.  Vitals:   02/18/20 2016 02/19/20 0420  BP: 140/78 (!) 150/82  Pulse: (!) 56 (!) 51  Resp: 17 18  Temp: 97.7 F (36.5 C) (!) 97.4 F (36.3 C)  SpO2: 96% 98%  7/5: BP elevated last couple of days but normal to low prior. Bradycardic. Continue to monitor.  12. ABLA: Due to fracture/hematoma. Stable at 9.5 13. Leucocytosis: Monitor for signs of infection--appears to be resolving.  14. Vitamin D deficiency: was on supplement at home. Will add Vitamin D level to 6/29  labs.  6/29: Level is normal. Will not restart supplement.  15. Vitamin B12 deficiency: was on supplement at home. Will add Vitamin B level to 6/29 labs.   6/29: Level is normal. Will not restart supplement.  15. Disposition: Patient lives with her son and his fiance.  7/5: Left voicemail for son to inform him that we scheduled Tylenol as per his request.     LOS: 7 days A FACE TO FACE EVALUATION WAS [place] 02/19/2020, 10:24 AM

## 2020-02-19 NOTE — Progress Notes (Signed)
Occupational Therapy Weekly Progress Note  Patient Details  Name: Crystal Ashley MRN: [id number] Date of Birth: [birth year]  Beginning of progress report period: February 13, 2020 End of progress report period: February 19, 2020  Today's Date: 02/19/2020 OT Individual Time: 1006-1101 OT Individual Time Calculation (min): 55 min    Patient has met 0 of 3 short term goals.  Mrs. Vialpando demonstrates slower progress than expected over the past week.  She continues to need mod assist or greater for bed mobility in preparation for ADL tasks.  She demonstrates delayed initiation with all tasks and requires mod overall instructional cueing for attempt to complete ADLS.  Decreased thoroughness is noted with bathing tasks.  Mod assist is needed for LB bathing with max assist for LB dressing to donn a new brief as well as gripper socks.  She needs total assist for donning her TLSO and min assist for donning a pullover gown. She needs mod assist for sit to stand with mod assist for stand pivot transfers at this time.  She has been able to complete transfers at min assist with use of the RW, but this has not been consistent the last few days.  Increased posterior LOB is noted in sitting on both the EOB as well as in the wheelchair.  Feel overall her participation continues to fluctuate and her motivation is minimal at this point secondary to pain and PLOF.  Recommend continued CIR level OT at this time to continue progression to min assist level goals with 24 hr assist to be provided by her son and his spouse.      Patient continues to demonstrate the following deficits: muscle weakness, decreased initiation, decreased attention, decreased awareness, decreased problem solving, decreased memory and delayed processing and decreased sitting balance, decreased standing balance, decreased postural control and decreased balance strategies and therefore will continue to benefit from skilled OT intervention to enhance overall  performance with BADL and Reduce care partner burden.  Patient progressing toward long term goals..  Continue plan of care.  OT Short Term Goals Week 2:  OT Short Term Goal 1 (Week 2): patient will complete functional transfers with CG/min A using RW OT Short Term Goal 2 (Week 2): patient will tolerate adl w/c level with min A using ADs OT Short Term Goal 3 (Week 2): patient will complete bed mobility with min A OT Short Term Goal 4 (Week 2): Patient with donn TLSO with mod assist for two consecutive sessions.  Skilled Therapeutic Interventions/Progress Updates:    Pt in bed to start session with pt's son and the PA present.  Nursing brought in pain meds as it has been discovered that pt is not taking these, and has not been consistently participating in therapy sessions.  She needed max coaxing with mod assist to transfer to the EOB.  The TLSO was donned in sitting with total assist and then she was able to complete stand pivot transfer to the wheelchair with mod assist.  She then worked on bathing tasks at the sink.  Therapist again assisted with doffing the TLSO and then she was able to complete UB bathing with mod instructional cueing for thoroughness and supervision.  She used the LH sponge for washing her lower legs and feet, but again needed mod instructional cueing.  Sockaide was used for donning gripper socks with overall max assist as well.  Pt declined washing peri area, stating that It had been done earlier this am.  Therapist assisted with donning TLSO  and pt completed stand pivot transfer back to the bed with mod assist using the RW for support.  She was then transitioned to supine with mod assist after removal of the TLSO.  Pt's son in the room as well with pt resting in bed.  Discussed with him her current lack of motivation and decreased consistent progress over this session.  He voices understanding and does his best to motivate her participation and help as much as possible.   Therapy  Documentation Precautions:  Precautions Precautions: Back, Fall Required Braces or Orthoses: Spinal Brace Spinal Brace: Thoracolumbosacral orthotic, Applied in sitting position, Other (comment) Spinal Brace Comments: TLSO when OOB Restrictions Weight Bearing Restrictions: No RLE Weight Bearing: Weight bearing as tolerated LLE Weight Bearing: Weight bearing as tolerated  Pain: Pain Assessment Faces Pain Scale: No hurt Pain Type: Acute pain Pain Location: Back Pain Orientation: Mid Pain Descriptors / Indicators: Aching Pain Onset: With Activity Pain Intervention(s): RN made aware;Repositioned;Medication (See eMAR) ADL: See Care Tool Section for some details of mobility and selfcare  Therapy/Group: Individual Therapy  Zeffie Bickert OTR/L 02/19/2020, 12:11 PM

## 2020-02-19 NOTE — Progress Notes (Signed)
Occupational Therapy Session Note  Patient Details  Name: Crystal Ashley MRN: [id number] Date of Birth: 07/05/1946  Today's Date: 02/19/2020 OT Individual Time: 9794-8016 OT Individual Time Calculation (min): 28 min    Short Term Goals: Week 1:  OT Short Term Goal 1 (Week 1): patient will complete bed mobility with min A OT Short Term Goal 1 - Progress (Week 1): Not met OT Short Term Goal 2 (Week 1): patient will tolerate adl w/c level with min A using ADs OT Short Term Goal 2 - Progress (Week 1): Not met OT Short Term Goal 3 (Week 1): patient will complete functional transfers with CG/min A using RW OT Short Term Goal 3 - Progress (Week 1): Not met  Skilled Therapeutic Interventions/Progress Updates:    1:1. Pt received in bed reporting 5/10 back pain and RN already administered medication. Pt completes log rolling with VC and sits to EOB with MOD A overall and OT applies TLSO with total A. Pt reporting need to toilet and stand pivot transfer EOB<>w/c with MOD A and VC for hand placement. Pt demo significant posterior bias in w/c requiring repositiong 4x to prevent to from sliding onto floor. Son present speaking to pt in 1st language assisting urging pt to scoot back into chair. Pt returned to bed d/t safety concerns/pain sitting up. lidocaine patches applied and pt reporting pain relief being back in bed, Pt scheduled in 30 min and will trial toileting again.  Therapy Documentation Precautions:  Precautions Precautions: Back, Fall Required Braces or Orthoses: Spinal Brace Spinal Brace: Thoracolumbosacral orthotic, Applied in sitting position, Other (comment) Spinal Brace Comments: TLSO when OOB Restrictions Weight Bearing Restrictions: No RLE Weight Bearing: Weight bearing as tolerated LLE Weight Bearing: Weight bearing as tolerated General:   Vital Signs:  Pain: Pain Assessment Faces Pain Scale: No hurt Pain Type: Acute pain Pain Location: Back Pain Orientation:  Mid Pain Descriptors / Indicators: Aching Pain Onset: With Activity Pain Intervention(s): RN made aware;Repositioned;Medication (See eMAR) ADL: ADL Eating: Supervision/safety, Set up Where Assessed-Eating: Bed level Grooming: Other (comment) (declined due to fatigue) Upper Body Bathing: Minimal assistance Where Assessed-Upper Body Bathing: Bed level Lower Body Bathing: Dependent Where Assessed-Lower Body Bathing: Bed level Upper Body Dressing: Moderate assistance Where Assessed-Upper Body Dressing: Bed level Lower Body Dressing: Dependent Where Assessed-Lower Body Dressing: Bed level ADL Comments: patient declined OOB for OT eval due to pain and fatigue Vision   Perception    Praxis   Exercises:   Other Treatments:     Therapy/Group: Individual Therapy  Tonny Branch 02/19/2020, 1:29 PM

## 2020-02-20 ENCOUNTER — Inpatient Hospital Stay (HOSPITAL_COMMUNITY): Payer: Medicare Other | Admitting: Occupational Therapy

## 2020-02-20 ENCOUNTER — Inpatient Hospital Stay (HOSPITAL_COMMUNITY): Payer: Medicare Other | Admitting: Physical Therapy

## 2020-02-20 LAB — GLUCOSE, CAPILLARY
Glucose-Capillary: 129 mg/dL — ABNORMAL HIGH (ref 70–99)
Glucose-Capillary: 141 mg/dL — ABNORMAL HIGH (ref 70–99)
Glucose-Capillary: 182 mg/dL — ABNORMAL HIGH (ref 70–99)
Glucose-Capillary: 163 mg/dL — ABNORMAL HIGH (ref 70–99)

## 2020-02-20 MED ORDER — TRAMADOL HCL 50 MG PO TABS
50.0000 mg | ORAL_TABLET | Freq: Three times a day (TID) | ORAL | Status: DC
  Administered 2020-02-20 – 2020-02-21 (×4): 50 mg via ORAL
  Filled 2020-02-20 (×4): qty 1

## 2020-02-20 NOTE — Progress Notes (Signed)
[place] PHYSICAL MEDICINE & REHABILITATION PROGRESS NOTE   Subjective/Complaints:   "slept too much"  No new issues, pain is under better control   ROS: Denies CP, SOB. N/V/D  Objective:   No results found. No results for input(s): WBC, HGB, HCT, PLT in the last 72 hours. Recent Labs    02/19/20 0659  NA 141  K 3.8  CL 103  CO2 28  GLUCOSE 146*  BUN 14  CREATININE 0.93  CALCIUM 8.5*    Intake/Output Summary (Last 24 hours) at 02/20/2020 0717 Last data filed at 02/20/2020 8366 Gross per 24 hour  Intake 720 ml  Output 625 ml  Net 95 ml     Physical Exam: Vital Signs Blood pressure (!) 131/93, pulse 63, temperature 97.7 F (36.5 C), resp. rate 17, height 5' (1.524 m), weight 89.9 kg, SpO2 97 %.   General: No acute distress Mood and affect are appropriate Heart: Regular rate and rhythm no rubs murmurs or extra sounds Lungs: Clear to auscultation, breathing unlabored, no rales or wheezes Abdomen: Positive bowel sounds, soft nontender to palpation, nondistended Extremities: No clubbing, cyanosis, or edema Skin: No evidence of breakdown, no evidence of rash  Musculoskeletal: Full range of motion in all 4 extremities. No joint swelling Posture appropriate Psych: Pt's affect is appropriate. Pt is cooperative   Assessment/Plan: 1. Functional deficits secondary to multiple fractures following fall, which require 3+ hours per day of interdisciplinary therapy in a comprehensive inpatient rehab setting.  Physiatrist is providing close team supervision and 24 hour management of active medical problems listed below.  Physiatrist and rehab team continue to assess barriers to discharge/monitor patient progress toward functional and medical goals  Care Tool:  Bathing    Body parts bathed by patient: Right arm, Left arm, Chest, Abdomen, Right upper leg, Left upper leg, Face, Left lower leg, Right lower leg   Body parts bathed by helper: Front perineal area, Buttocks      Bathing assist Assist Level: Moderate Assistance - Patient 50 - 74%     Upper Body Dressing/Undressing Upper body dressing Upper body dressing/undressing activity did not occur (including orthotics): N/A What is the patient wearing?: Dress    Upper body assist Assist Level: Minimal Assistance - Patient > 75%    Lower Body Dressing/Undressing Lower body dressing      What is the patient wearing?: Incontinence brief     Lower body assist Assist for lower body dressing: Maximal Assistance - Patient 25 - 49%     Toileting Toileting    Toileting assist Assist for toileting: Maximal Assistance - Patient 25 - 49%     Transfers Chair/bed transfer  Transfers assist     Chair/bed transfer assist level: Moderate Assistance - Patient 50 - 74% Chair/bed transfer assistive device: Museum/gallery exhibitions officer assist      Assist level: Minimal Assistance - Patient > 75% Assistive device: Parallel bars Max distance: 8   Walk 10 feet activity   Assist  Walk 10 feet activity did not occur: Safety/medical concerns  Assist level: Minimal Assistance - Patient > 75% Assistive device: Walker-rolling   Walk 50 feet activity   Assist Walk 50 feet with 2 turns activity did not occur: Safety/medical concerns  Assist level: Minimal Assistance - Patient > 75% Assistive device: Walker-rolling    Walk 150 feet activity   Assist Walk 150 feet activity did not occur: Safety/medical concerns         Walk 10  feet on uneven surface  activity   Assist Walk 10 feet on uneven surfaces activity did not occur: Safety/medical concerns         Wheelchair     Assist Will patient use wheelchair at discharge?: No (if used w/c would be for dependent community transport)             Wheelchair 50 feet with 2 turns activity    Assist            Wheelchair 150 feet activity     Assist          Blood pressure (!) 131/93, pulse 63,  temperature 97.7 F (36.5 C), resp. rate 17, height 5' (1.524 m), weight 89.9 kg, SpO2 97 %.  Medical Problem List and Plan: 1.  Impaired mobility and ADLs secondary to multiple traumatic injuries following fall, possibly secondary to starting Cardizem.              CIR PT, OT, team conf in am              -ELOS/Goals: 10-14 days modI PT, OT, I in SLP 2.  Antithrombotics: -DVT/anticoagulation:  Pharmaceutical: Xarelto , Hgb stable              -antiplatelet therapy:N/a 3. Pain Management: Patient feels nauseous with opioids but pain is significantly limiting therapy. Scheduled Tylenol and added lidocaine patch and kpad for lumbar paraspinals where pain is worst. Addendum: She is willing to try Tramadol- will see how she tolerates 4. Mood: Team to provide ego support. LCSW to follow for evaluation and support.              -antipsychotic agents: N/A 5. Neuropsych: This patient is capable of making decisions on her own behalf. 6. Skin/Wound Care: Routine pressure relief measures.  7. Fluids/Electrolytes/Nutrition: Monitor I/O. Check lytes in am.  8. T10/T11 fractures: TLSO when at edge of bed and out of bed. Encouraged to wear in bed for support comfort if tolerated.  9. Right pelvic fracture: WBAT 10. T2DM: Hgb A1c- 6.8. will monitor BS ac/hs. Resume metformin once intake improves.  6/29: CBGs poorly controled. Will monitor with increased mobility.  CBG (last 3)  Recent Labs    02/19/20 1647 02/19/20 2105 02/20/20 0550  GLUCAP 142* 159* 129*  7/6: well controlled 11. PAF: Monitor HR tid--continue Xarelto, Cardizem and Metoprolol XL daily.  Vitals:   02/19/20 1932 02/20/20 0455  BP: (!) 133/58 (!) 131/93  Pulse: 74 63  Resp:    Temp: 97.7 F (36.5 C) 97.7 F (36.5 C)  SpO2: 97% 97%  7/6 controlled without bradycardia   12. ABLA: Due to fracture/hematoma. Stable at 9.5 13. Leucocytosis: Monitor for signs of infection--appears to be resolving.  14. Vitamin D deficiency: was on  supplement at home. Will add Vitamin D level to 6/29 labs.  6/29: Level is normal. Will not restart supplement.  15. Vitamin B12 deficiency: was on supplement at home. Will add Vitamin B level to 6/29 labs.   6/29: Level is normal. Will not restart supplement.  15. Disposition: Patient lives with her son and his fiance.  7/5: Left voicemail for son to inform him that we scheduled Tylenol as per his request.     LOS: 8 days A FACE TO [place] E Brenlee Koskela 02/20/2020, 7:17 AM

## 2020-02-20 NOTE — Progress Notes (Addendum)
Occupational Therapy Session Note  Patient Details  Name: Crystal Ashley MRN: [id number] Date of Birth: 1946/01/27  Today's Date: 02/20/2020 OT Individual Time: 0800-0900 OT Individual Time Calculation (min): 60 min    Short Term Goals: Week 2:  OT Short Term Goal 1 (Week 2): patient will complete functional transfers with CG/min A using RW OT Short Term Goal 2 (Week 2): patient will tolerate adl w/c level with min A using ADs OT Short Term Goal 3 (Week 2): patient will complete bed mobility with min A OT Short Term Goal 4 (Week 2): Patient with donn TLSO with mod assist for two consecutive sessions.  Skilled Therapeutic Interventions/Progress Updates:   Session 1: (0800-0900) Pt completed supine to sit EOB with max assist to start session.  Pt reporting some pain, but meds given prior to session per pt report.  She needed total assist for donning TLSO.  Pt with bowel incontinence in the brief upon sitting.  She completed stand pivot transfer to the 3:1 with mod assist.  Max assist was needed for removing soiled brief and for completing peri cleaning.  She then transferred over to the wheelchair for work on bathing and dressing at the sink.  She was able to complete UB bathing with setup and min instructional cueing for thoroughness.  LB bathing was at max assist level, with pt washing front peri area with min assist for balance and UE support on the sink.  Therapist assisted with washing buttocks.  She donned a Administrator, [employer] with min assist.  Total assist was needed for donning the TLSO once dressing was completed.  She was left in the wheelchair at end of session with call button and phone in reach and safety belt in place.  Pt continues to demonstrate decreased initiation to tasks with slow movements secondary to increased pain.  She did report that her pain was less this session compared to yesterday however.   Session 2: (3016-0109) Pt in bed with son present to begin session.  She needed  max assist for transfer to the EOB with total assist for donning TLSO in sitting.  Min assist for static sitting balance secondary to posterior lean.  She then worked on sit to stand from the EOB with stand pivot to the wheelchair at mod assist level, using the RW for support.  Once in sitting, had her work on grooming task of brushing her teeth at the sink, secondary to not completing this earlier during am session.  She was able to complete with setup assist.  Next, had her work on doffing and donning gripper socks with the reacher and the sockaide.  She needed max assist to complete and while sitting in the wheelchair, she would continually keep leaning backwards and sliding her hips closer and closer to the edge of the chair.  Mod assist for scooting back in the wheelchair.  Finished session with functional transfer back to the EOB with mod assist.  She was then able to complete one interval of standing with the RW while alternating LE's for marching in place.  She was able to complete only 3-4 steps on each foot before needing to sit back down secondary to fatigue.  Finished session with removal of the TLSO with mod assist and transition to supine from sitting with max assist.  Pt was left in sidelying on the left side with call button and phone in reach and safety alarm in place.   Therapy Documentation Precautions:  Precautions Precautions: Back, Fall  Required Braces or Orthoses: Spinal Brace Spinal Brace: Thoracolumbosacral orthotic, Applied in sitting position, Other (comment) Spinal Brace Comments: TLSO when OOB Restrictions Weight Bearing Restrictions: No RLE Weight Bearing: Weight bearing as tolerated LLE Weight Bearing: Weight bearing as tolerated  Pain: Pain Assessment Pain Scale: Faces Pain Score: 5  Faces Pain Scale: Hurts little more Pain Type: Acute pain Pain Location: Back Pain Orientation: Mid Pain Descriptors / Indicators: Discomfort;Grimacing Pain Onset: With  Activity Pain Intervention(s): Repositioned;Emotional support Multiple Pain Sites: No ADL: See Care Tool Section for some details of mobility and selfcare  Therapy/Group: Individual Therapy  Lanitra Battaglini OTR/L 02/20/2020, 11:50 AM

## 2020-02-20 NOTE — Progress Notes (Signed)
Occupational Therapy Session Note  Patient Details  Name: Crystal Ashley MRN: [id number] Date of Birth: 13-Nov-1945  Today's Date: 02/20/2020 OT Individual Time: 1300-1315 OT Individual Time Calculation (min): 15 min  and Today's Date: 02/20/2020 OT Missed Time: 15 Minutes Missed Time Reason: Patient ill (comment);Pain   Short Term Goals: Week 1:  OT Short Term Goal 1 (Week 1): patient will complete bed mobility with min A OT Short Term Goal 1 - Progress (Week 1): Not met OT Short Term Goal 2 (Week 1): patient will tolerate adl w/c level with min A using ADs OT Short Term Goal 2 - Progress (Week 1): Not met OT Short Term Goal 3 (Week 1): patient will complete functional transfers with CG/min A using RW OT Short Term Goal 3 - Progress (Week 1): Not met Week 2:  OT Short Term Goal 1 (Week 2): patient will complete functional transfers with CG/min A using RW OT Short Term Goal 2 (Week 2): patient will tolerate adl w/c level with min A using ADs OT Short Term Goal 3 (Week 2): patient will complete bed mobility with min A OT Short Term Goal 4 (Week 2): Patient with donn TLSO with mod assist for two consecutive sessions.  Skilled Therapeutic Interventions/Progress Updates:    1:1 Pt declined out of bed despite encouragement from this therapist and pt's son. Pt reported still not feeling well. Pt required mod A to roll in the bed to reposition on her side for different position and to apply new lidocaine patches.   Left resting in sidelying with son present.  Therapy Documentation Precautions:  Precautions Precautions: Back, Fall Required Braces or Orthoses: Spinal Brace Spinal Brace: Thoracolumbosacral orthotic, Applied in sitting position, Other (comment) Spinal Brace Comments: TLSO when OOB Restrictions Weight Bearing Restrictions: No RLE Weight Bearing: Weight bearing as tolerated LLE Weight Bearing: Weight bearing as tolerated General: General OT Amount of Missed Time: 15  Minutes Vital Signs:  Pain:  pt c/o ongoing pain in lower back- new lidocaine patched applied; pt also continues to be nauseated from meds and has been vomiting   Therapy/Group: Individual Therapy  Crystal Ashley [facility] 02/20/2020, 1:28 PM

## 2020-02-20 NOTE — Plan of Care (Signed)
  Problem: RH Balance Goal: LTG Patient will maintain dynamic standing balance (PT) Description: LTG:  Patient will maintain dynamic standing balance with assistance during mobility activities (PT) Flowsheets (Taken 02/20/2020 1852) LTG: Pt will maintain dynamic standing balance during mobility activities with:: (Downgraded based on progress) Minimal Assistance - Patient > 75% Note: Downgraded based on progress   Problem: Sit to Stand Goal: LTG:  Patient will perform sit to stand with assistance level (PT) Description: LTG:  Patient will perform sit to stand with assistance level (PT) Flowsheets (Taken 02/20/2020 1852) LTG: PT will perform sit to stand in preparation for functional mobility with assistance level: (Downgraded based on progress) Contact Guard/Touching assist Note: Downgraded based on progress   Problem: RH Bed Mobility Goal: LTG Patient will perform bed mobility with assist (PT) Description: LTG: Patient will perform bed mobility with assistance, with/without cues (PT). Flowsheets (Taken 02/20/2020 1852) LTG: Pt will perform bed mobility with assistance level of: (Downgraded based on progress) Minimal Assistance - Patient > 75% Note: Downgraded based on progress   Problem: RH Bed to Chair Transfers Goal: LTG Patient will perform bed/chair transfers w/assist (PT) Description: LTG: Patient will perform bed to chair transfers with assistance (PT). Flowsheets (Taken 02/20/2020 1852) LTG: Pt will perform Bed to Chair Transfers with assistance level: (Downgraded based on progress) Contact Guard/Touching assist Note: Downgraded based on progress   Problem: RH Ambulation Goal: LTG Patient will ambulate in controlled environment (PT) Description: LTG: Patient will ambulate in a controlled environment, # of feet with assistance (PT). Flowsheets (Taken 02/20/2020 1852) LTG: Pt will ambulate in controlled environ  assist needed:: (Downgraded based on progress) Contact Guard/Touching  assist LTG: Ambulation distance in controlled environment: 72ft using LRAD Note: Downgraded based on progress Goal: LTG Patient will ambulate in home environment (PT) Description: LTG: Patient will ambulate in home environment, # of feet with assistance (PT). Flowsheets (Taken 02/20/2020 1852) LTG: Pt will ambulate in home environ  assist needed:: (Downgraded based on progress) Contact Guard/Touching assist LTG: Ambulation distance in home environment: 5ft using LRAD Note: Downgraded based on progress   Problem: RH Stairs Goal: LTG Patient will ambulate up and down stairs w/assist (PT) Description: LTG: Patient will ambulate up and down # of stairs with assistance (PT) Flowsheets (Taken 02/20/2020 1852) LTG: Pt will ambulate up/down stairs assist needed:: (Downgraded based on progress) Contact Guard/Touching assist LTG: Pt will  ambulate up and down number of stairs: 3 steps using HRs per home set-up Note: Downgraded based on progress

## 2020-02-20 NOTE — Progress Notes (Signed)
Physical Therapy Weekly Progress Note  Patient Details  Name: Crystal Ashley MRN: [id number] Date of Birth: 13-Nov-1945  Beginning of progress report period: February 13, 2020 End of progress report period: February 20, 2020  Today's Date: 02/20/2020 PT Individual Time: 7989-2119 PT Individual Time Calculation (min): 58 min   Patient has met 0 of 1 short term goals as none were set based on original ELOS.  Crystal Ashley has made slower than anticipated progress with therapy continuing to demonstrate impaired initiation for mobility tasks and significantly impaired activity tolerance due to prior sedentary lifestyle. She is performing supine<>sit with mod assist and max cuing to maintain spine precautions. She was performing sit<>stands and stand pivots using RW with min assist but in the past 3 days has required up to mod/max assist. She was able to ambulate up to 17f using RW with min assist but has not ambulated that distance in the past 3 days. Will continue to monitor mobility level and progress with therapy.   Patient continues to demonstrate the following deficits muscle weakness, decreased cardiorespiratoy endurance,  , decreased initiation, decreased awareness, decreased problem solving, decreased safety awareness and delayed processing and decreased sitting balance, decreased standing balance, decreased postural control and decreased balance strategies and therefore will continue to benefit from skilled PT intervention to increase functional independence with mobility.  Patient not progressing toward long term goals.  See goal revision.  Plan of care revisions: changed to 15/7 and discharge plan updated to possible SNF due to pt's slow progress and limited family support.  PT Short Term Goals Week 1:  PT Short Term Goal 1 (Week 1): = to LTGs based on ELOS PT Short Term Goal 1 - Progress (Week 1): Progressing toward goal Week 2:  PT Short Term Goal 1 (Week 2): Pt will perform sit<>stand with min  assist PT Short Term Goal 2 (Week 2): Pt will perform stand pivot transfers with min assist PT Short Term Goal 3 (Week 2): Pt will ambulate at least 534fwith CGA PT Short Term Goal 4 (Week 2): Pt will ascend/descend 4 steps using B HRs with min assist  Skilled Therapeutic Interventions/Progress Updates:  Ambulation/gait training;Community reintegration;DME/adaptive equipment instruction;Neuromuscular re-education;Stair training;UE/LE Strength taining/ROM;Psychosocial support;Balance/vestibular training;Discharge planning;Functional electrical stimulation;Pain management;Skin care/wound management;Therapeutic Activities;UE/LE Coordination activities;Cognitive remediation/compensation;Disease management/prevention;Functional mobility training;Patient/family education;Splinting/orthotics;Therapeutic Exercise;Visual/perceptual remediation/compensation   Pt received sitting in w/c and agreeable to therapy session. Pt wearing TLSO.  Pt's son reports pain medication administered ~1555mtes prior to therapist's arrival. Based on prior therapy notes discussed discharge plan with pt/son and the son reports in order to care for pt safely at home she needs to be at supervision/mod-I level for bed mobility, transfers, and toileting and he has requested we discuss SNF placement with SW - ChrMargreta JourneyW made aware. Therapist will continue with current treatment interventions targeting advancement of pt's independence with functional mobility. Transported to/from gym in w/c for time management and energy conservation.  Sit>stands w/c>RW with mod progressed to max assist for lifting into standing during session - continues to require mod/max assist for leaning trunk forward as pt sits in significant posterior pelvic tilt and posterior trunk lean - requires max cuing to place 1 hand on RW during transfer to promote anterior trunk lean followed by hip/knee extension b/c when pt pushes both hands from w/c armrests it causes  worsening posterior lean in standing. Stand>sit in w/c requires mod assist for anterior trunk lean with posterior hip placement in the chair - 1x pt did not  place hips back far enough in chair causing her to sit on edge of seat and her dress was slick causing her to slide towards front of chair requiring max assist to scoot backwards safely.  Therapist reassessed if a wider hemi-height w/c was available but was not at this time. Attempted standing balance task using RW working towards decreased UE support on AD via putting together a lego pattern but pt started leaning forearms onto rolling tray table pushing it forward so therapist had to hold it in place with max assist and pt returned to sitting - she was unsafe at this time to perform bimanual tasks due to needing at least 1 UE support on RW and at least min/mod assist to maintain upright.  Stand pivot transfers 2x to/from w/c using RW with mod assist for balance and AD management while turning - has insufficient LE step lengths during transfer with poor motor planning/awareness to step back towards the seat during transfer - cuing to correct. Pt about to start ambulating when suddenly she states "vomit." Pt with small amount of emesis (light brown color) - therapist provided trash can and cool wash clothe. Transported back to room and RN notified and present to assess. Pt requesting to go back to bed. L stand pivot w/c>EOB using RW with mod assist for lifting and balance due to strong posterior lean. Doffed TLSO total assist. Sit>supine with mod assist for B LE management into the bed. Pt left with son present, HOB elevated to 30 degrees, bed alarm on, and needs in reach.  Therapy Documentation Precautions:  Precautions Precautions: Back, Fall Required Braces or Orthoses: Spinal Brace Spinal Brace: Thoracolumbosacral orthotic, Applied in sitting position, Other (comment) Spinal Brace Comments: TLSO when OOB Restrictions Weight Bearing Restrictions:  No RLE Weight Bearing: Weight bearing as tolerated LLE Weight Bearing: Weight bearing as tolerated  Pain:   Continues to state "a little bit of pain" when inquired - premedicated - therapist provided seated rest breaks and repositioning for pain management.   Therapy/Group: Individual Therapy  Tawana Scale, PT, DPT, CSRS  02/20/2020, 7:55 AM

## 2020-02-20 NOTE — Progress Notes (Signed)
Physical Therapy Note  Patient Details  Name: Crystal Ashley MRN: [id number] Date of Birth: May 01, 1946 Today's Date: 02/20/2020   Pt's plan of care adjusted to 15/7 after speaking with care team and discussing with MD as pt unable to tolerate current therapy schedule with OT and PT.    Tawana Scale, PT, DPT, CSRS  02/20/2020, 6:40 PM

## 2020-02-20 NOTE — Plan of Care (Signed)
  Problem: RH SAFETY Goal: RH STG ADHERE TO SAFETY PRECAUTIONS W/ASSISTANCE/DEVICE Description: STG Adhere to Safety Precautions With mod I Assistance/Device. Outcome: Progressing   Problem: RH PAIN MANAGEMENT Goal: RH STG PAIN MANAGED AT OR BELOW PT'S PAIN GOAL Description: < 4 Outcome: Progressing

## 2020-02-20 NOTE — Progress Notes (Signed)
Patient ID: Crystal Ashley, female   DOB: July 25, 1946, 74 y.o.   MRN: [id number]   Sw provided son with SNF options. Son will review and get back with SW

## 2020-02-21 ENCOUNTER — Inpatient Hospital Stay (HOSPITAL_COMMUNITY): Payer: Medicare Other | Admitting: Physical Therapy

## 2020-02-21 ENCOUNTER — Inpatient Hospital Stay (HOSPITAL_COMMUNITY): Payer: Medicare Other

## 2020-02-21 ENCOUNTER — Inpatient Hospital Stay (HOSPITAL_COMMUNITY): Payer: Medicare Other | Admitting: Occupational Therapy

## 2020-02-21 LAB — GLUCOSE, CAPILLARY
Glucose-Capillary: 141 mg/dL — ABNORMAL HIGH (ref 70–99)
Glucose-Capillary: 172 mg/dL — ABNORMAL HIGH (ref 70–99)
Glucose-Capillary: 201 mg/dL — ABNORMAL HIGH (ref 70–99)
Glucose-Capillary: 137 mg/dL — ABNORMAL HIGH (ref 70–99)

## 2020-02-21 IMAGING — CT CT HEAD W/O CM
4 series · 16 of 47 positions shown, 18 images · non-contrast
Comparison: [DATE]

CLINICAL DATA: Change in mental status

EXAM:
CT HEAD WITHOUT CONTRAST
TECHNIQUE: Contiguous axial images were obtained from the base of the skull
through the vertex without intravenous contrast.

[Series 3: head without · axial · non-contrast · 0.41mm/px · z∈[-104,+11]mm · 7 of 31 slices shown, 9 images]
[im 4/31  brain]
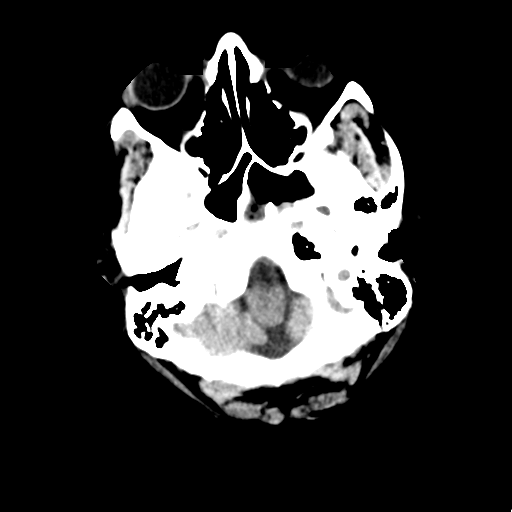
[im 4/31  bone]
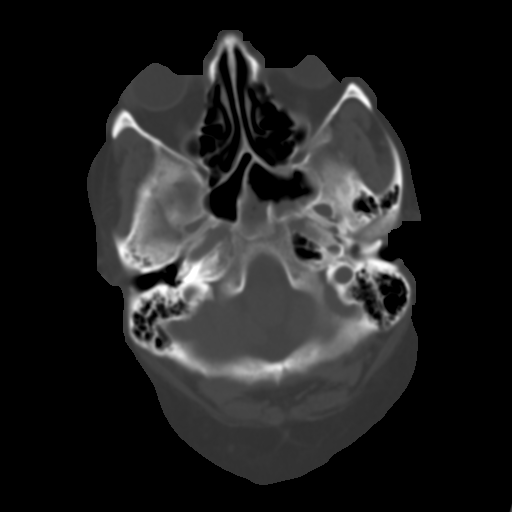
[im 8/31  brain]
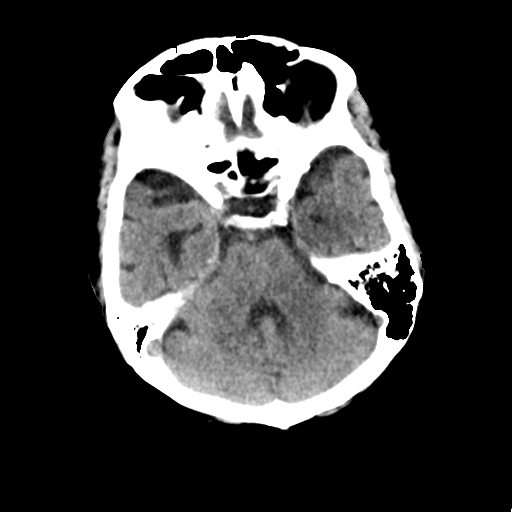
[im 12/31  brain]
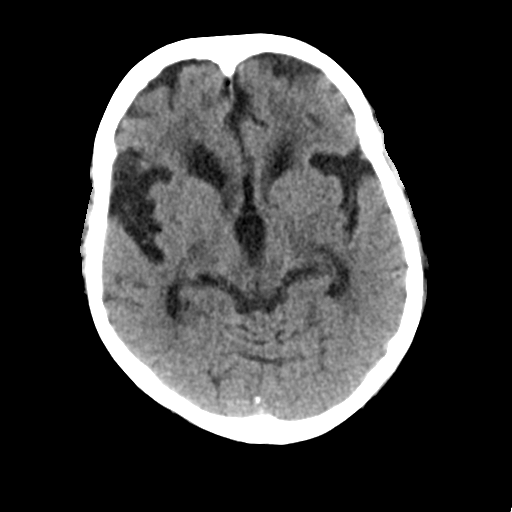
[im 16/31  brain]
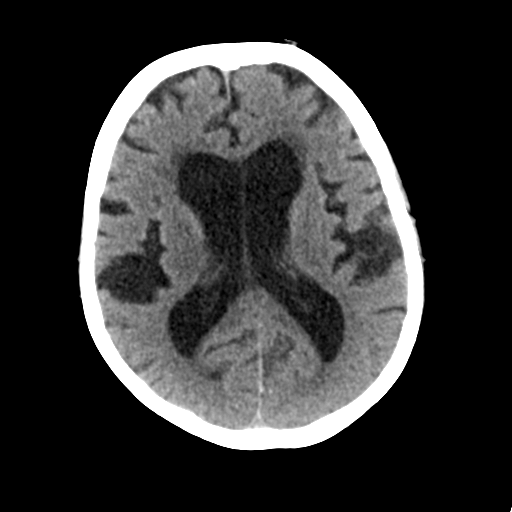
[im 19/31  brain]
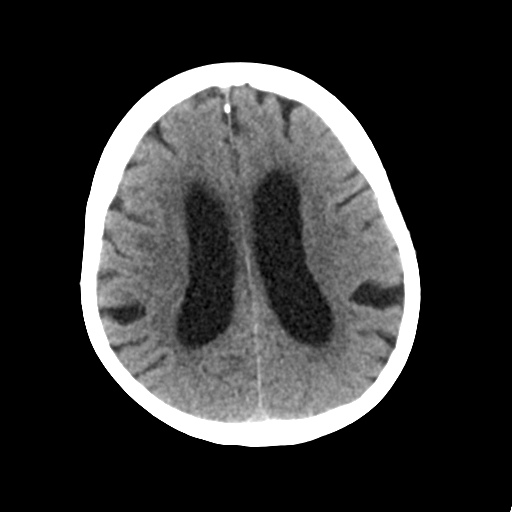
[im 19/31  bone]
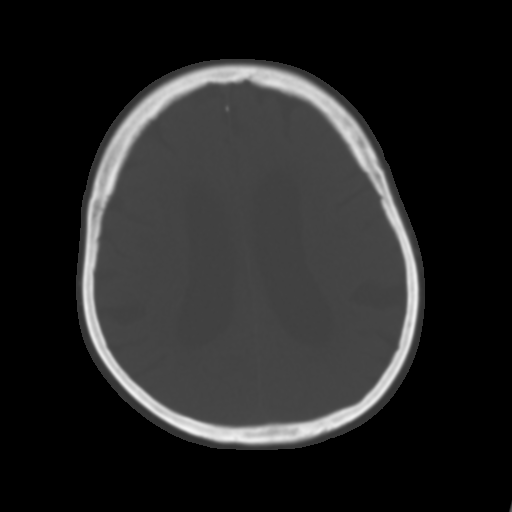
[im 23/31  brain]
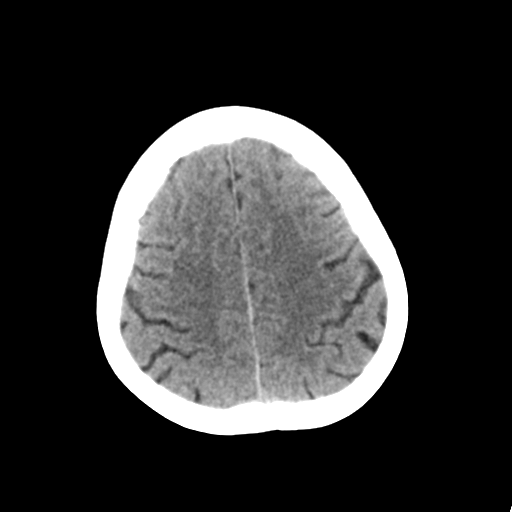
[im 27/31  brain]
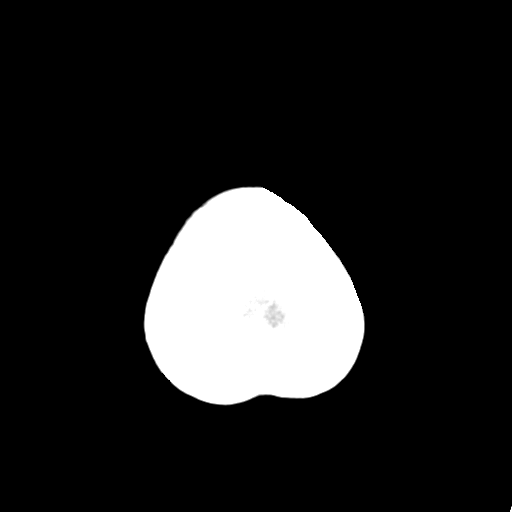

[Series 4: head bone · axial · 0.41mm/px · z∈[-105,-75]mm · 3 of 76 slices shown]
[im 8/76  bone]
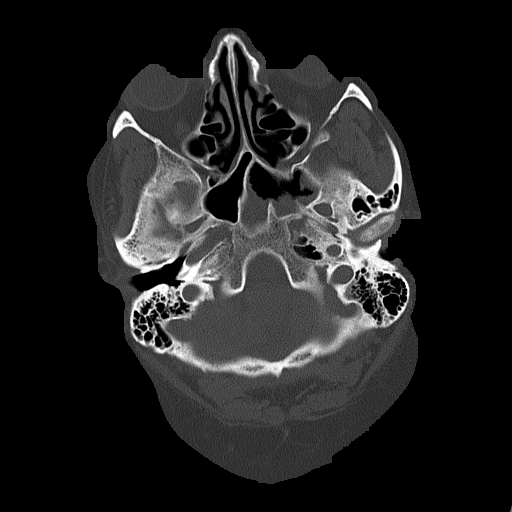
[im 16/76  bone]
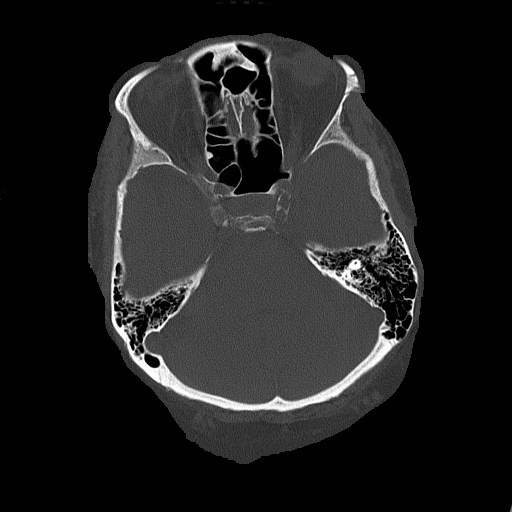
[im 23/76  bone]
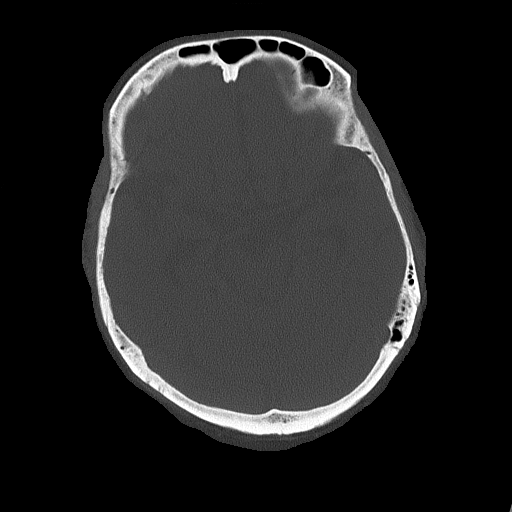

[Series 5: head without cor · coronal · non-contrast · 0.29mm/px · 3 of 67 slices shown]
[im 23/67  brain]
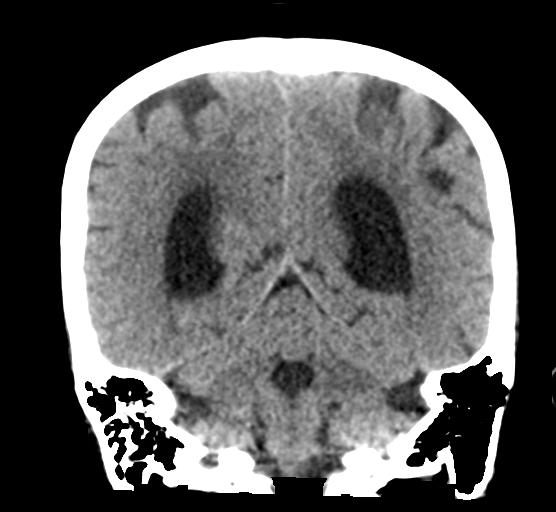
[im 30/67  brain]
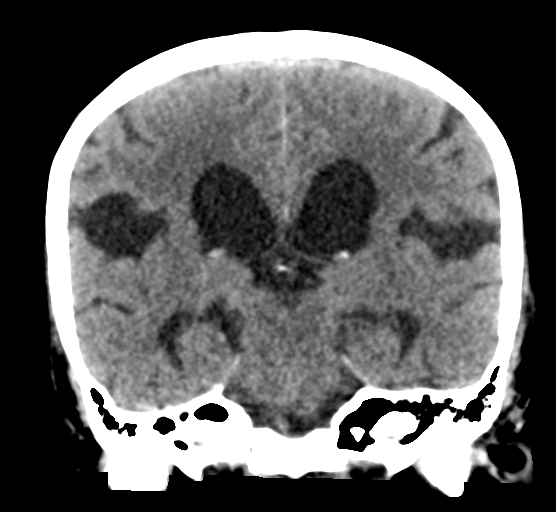
[im 37/67  brain]
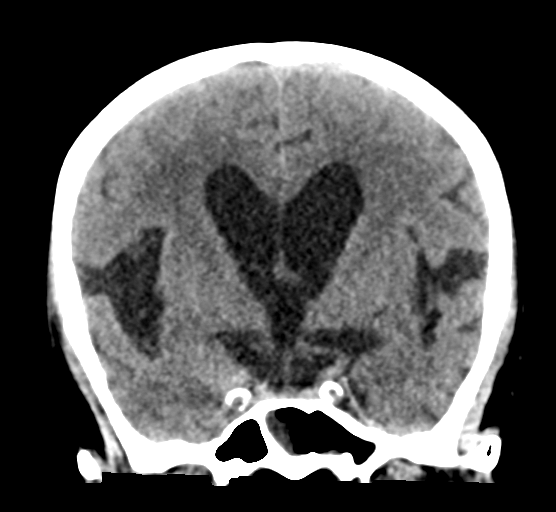

[Series 6: head without sag · sagittal · non-contrast · 0.30mm/px · 3 of 58 slices shown]
[im 20/58  brain]
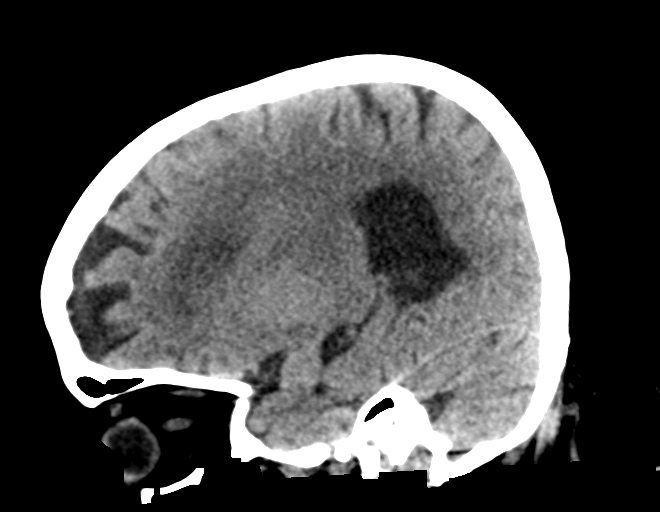
[im 29/58  brain]
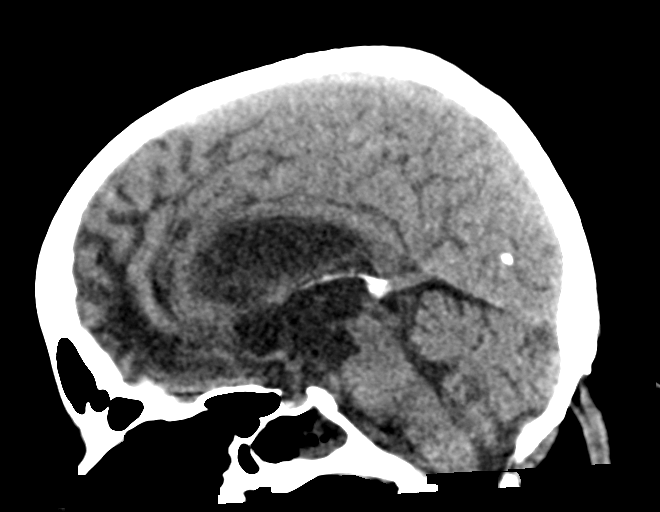
[im 39/58  brain]
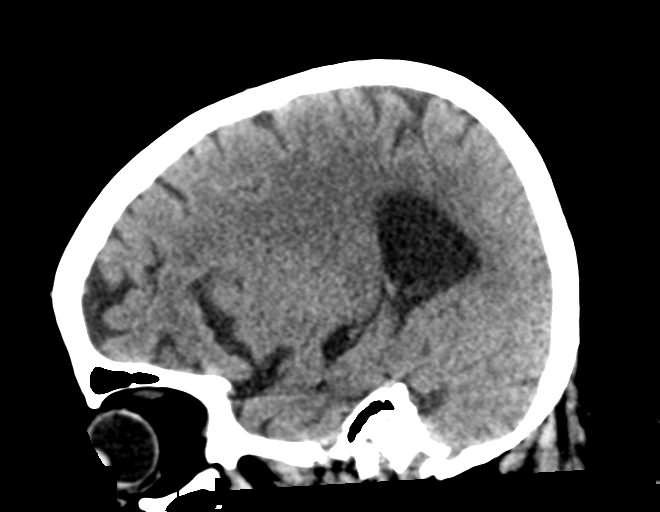

[16 of 47 positions shown; findings below may reference images not displayed]

FINDINGS: Brain: There is no acute intracranial hemorrhage, mass effect, or
edema. No new loss of gray-white differentiation. There is no
extra-axial fluid collection. Prominence of the ventricles and sulci
reflects stable parenchymal volume loss. Patchy hypoattenuation in
the supratentorial white matter is nonspecific but probably reflects
stable chronic microvascular ischemic changes

Vascular: There is atherosclerotic calcification at the skull base.

Skull: Calvarium is unremarkable.

Sinuses/Orbits: Increased retained secretions within the left
sphenoid sinus. No acute orbital abnormality.

Other: None.
IMPRESSION: No acute intracranial abnormality. Stable chronic findings detailed
above.

## 2020-02-21 IMAGING — MR MR THORACIC SPINE WO/W CM
11 of 19 series · 19 of 48 positions shown · IV contrast (gadavist)
Comparison: [DATE]

CLINICAL DATA: High-grade with marrow edema remains with stable
height loss tubular stool marrow edema

EXAM:
MRI THORACIC WITHOUT AND WITH CONTRAST
TECHNIQUE: Multiplanar and multiecho pulse sequences of the thoracic spine were
obtained without and with intravenous contrast.
CONTRAST:  9mL GADAVIST GADOBUTROL 1 MMOL/ML IV SOLN

[Series 12: T1 · sagittal · 5.0mm · 1.46mm/px · 1 of 9 slices shown (1 of 5)]
[im 1/9]
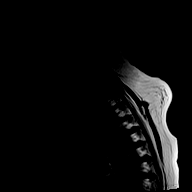

[Series 13: T1 · sagittal · 5.0mm · 1.23mm/px · 1 of 9 slices shown (2 of 5)]
[im 1/9]
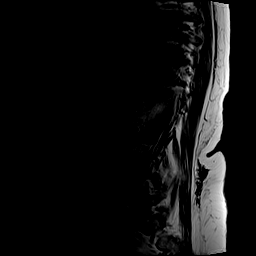

[Series 14: T1 · sagittal · 6.0mm · 1.23mm/px · 1 of 8 slices shown (3 of 5)]
[im 1/8]
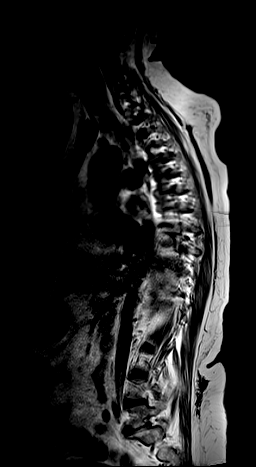

[Series 15: T2 · sagittal · 3.0mm · 0.76mm/px · 1 of 21 slices shown (1 of 2)]
[im 1/21]
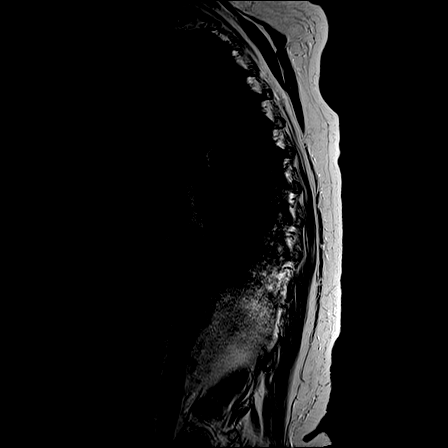

[Series 16: T1 · sagittal · 3.0mm · 0.76mm/px · 1 of 21 slices shown (4 of 5)]
[im 1/21]
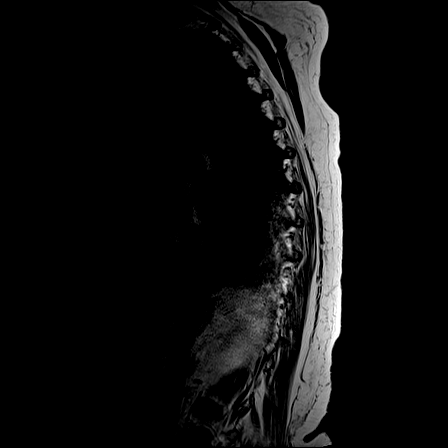

[Series 17: STIR · sagittal · 3.0mm · 0.38mm/px · 1 of 21 slices shown]
[im 1/21]
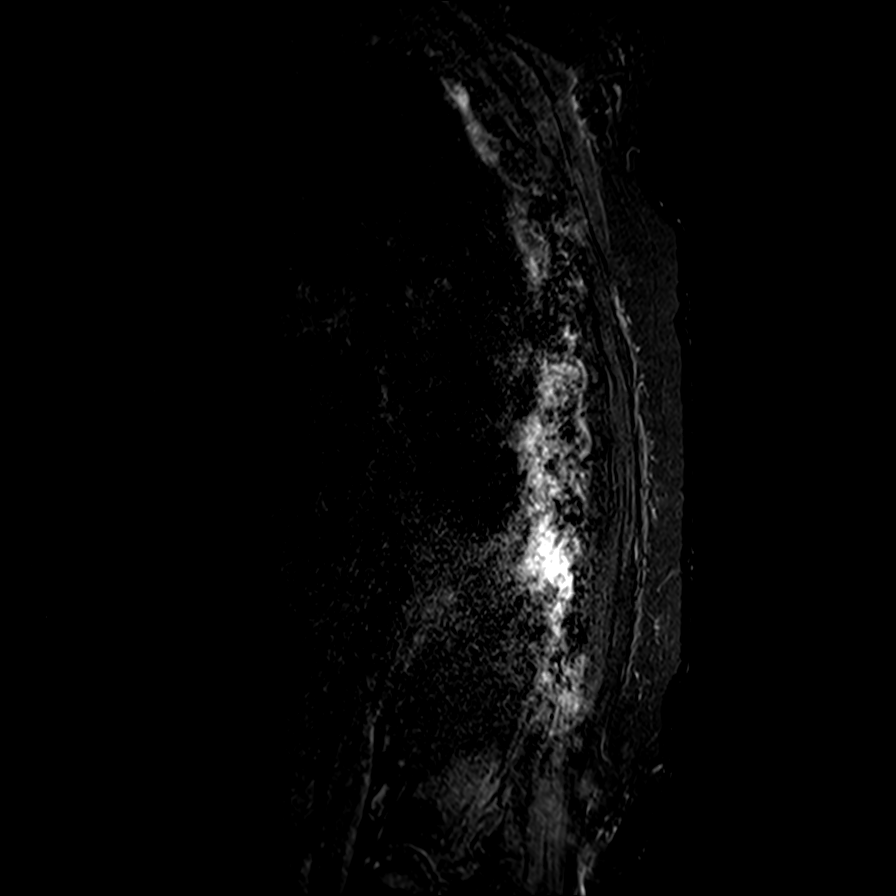

[Series 18: T2 · axial · 4.0mm · 0.74mm/px · z∈[-214,+22]mm · 3 of 46 slices shown (2 of 2)]
[im 1/46]
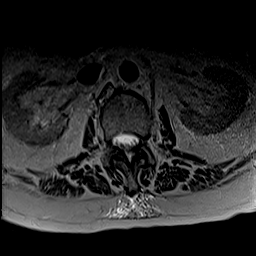
[im 23/46]
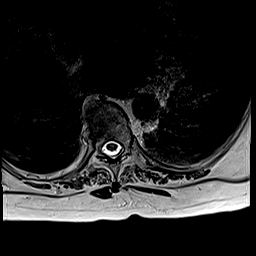
[im 46/46]
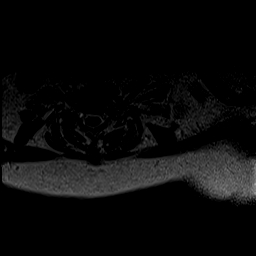

[Series 19: GRE · axial · 4.0mm · 0.37mm/px · z∈[-214,+22]mm · 3 of 46 slices shown]
[im 1/46]
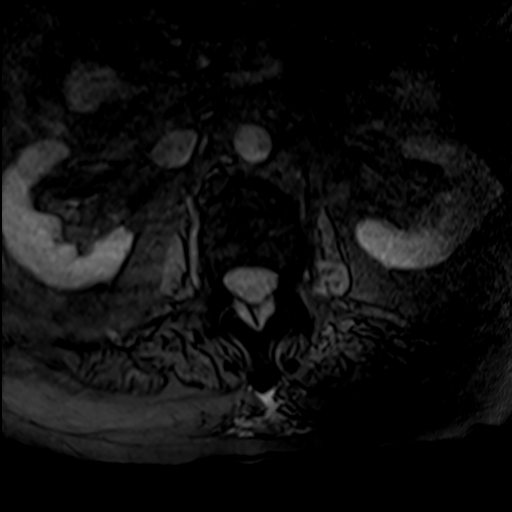
[im 23/46]
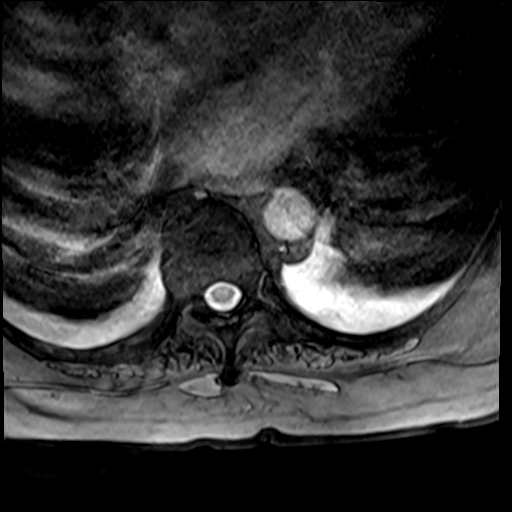
[im 46/46]
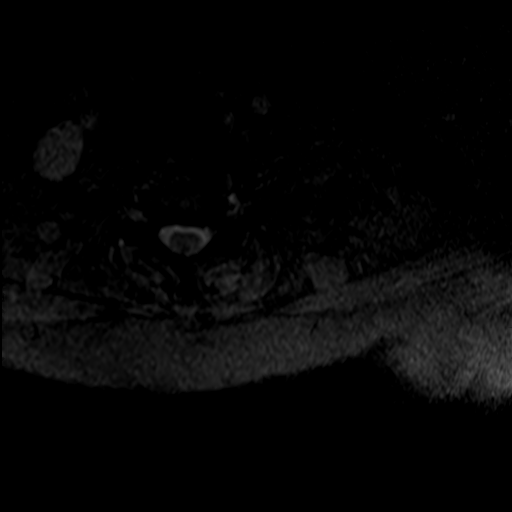

[Series 20: T1 · axial · non-contrast · 4.0mm · 0.37mm/px · z∈[-214,+22]mm · 3 of 46 slices shown (5 of 5)]
[im 1/46]
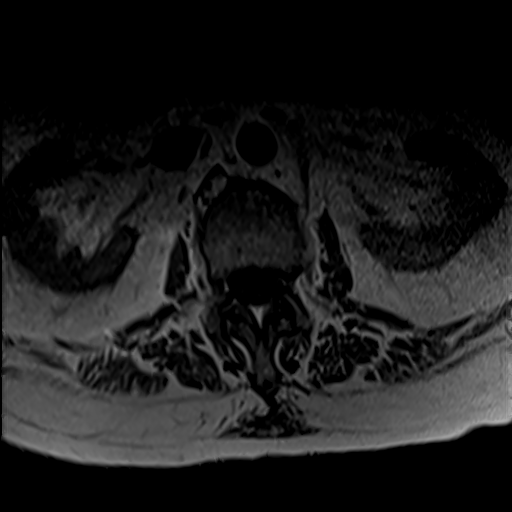
[im 23/46]
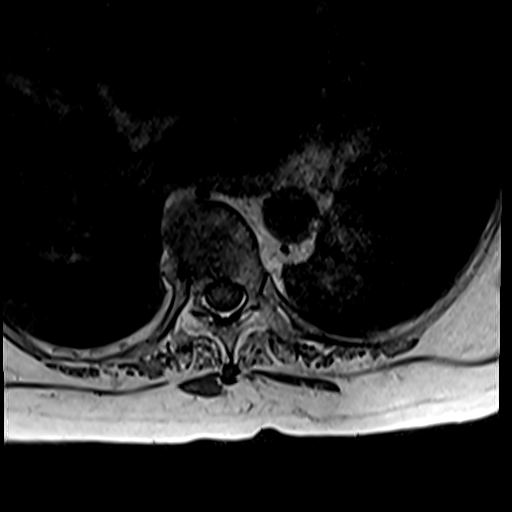
[im 46/46]
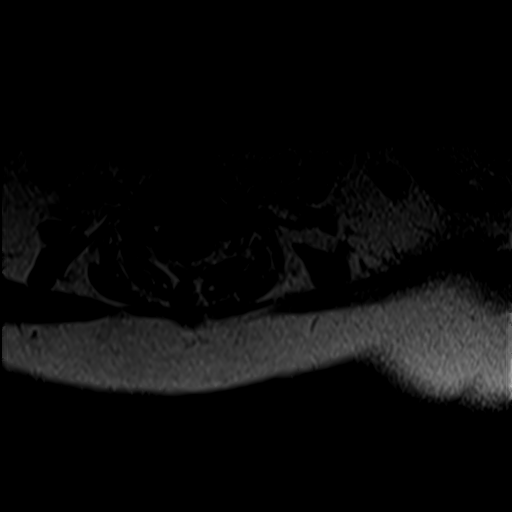

[Series 21: T1 post-contrast · axial · 4.0mm · 0.37mm/px · z∈[-214,+22]mm · 3 of 46 slices shown]
[im 1/46]
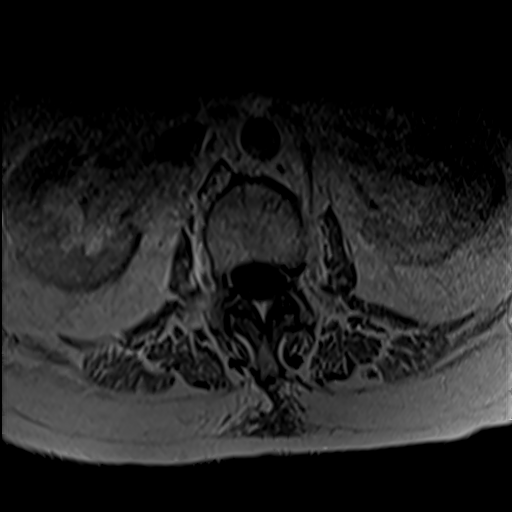
[im 23/46]
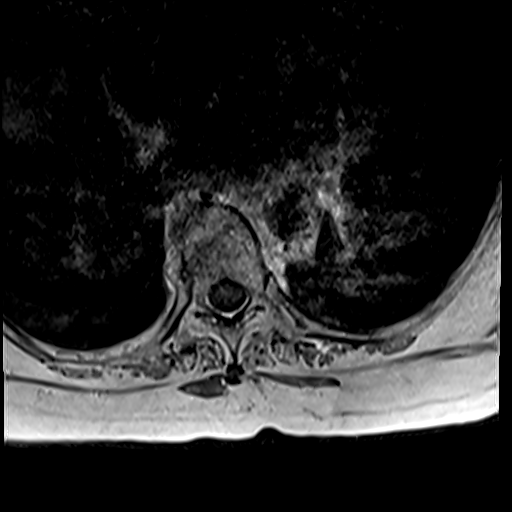
[im 46/46]
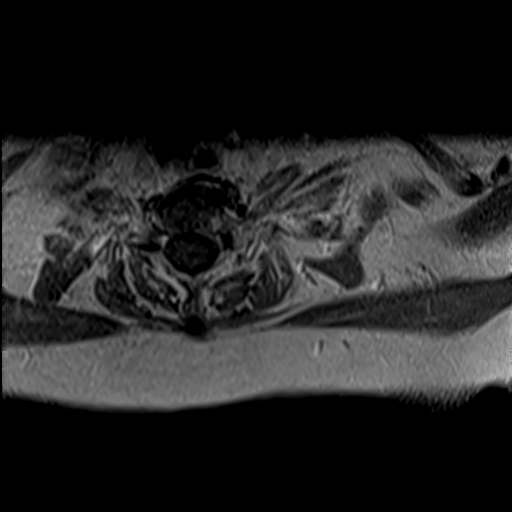

[Series 22: T1 fat-sat post-contrast · sagittal · 3.0mm · 0.76mm/px · 1 of 21 slices shown]
[im 1/21]
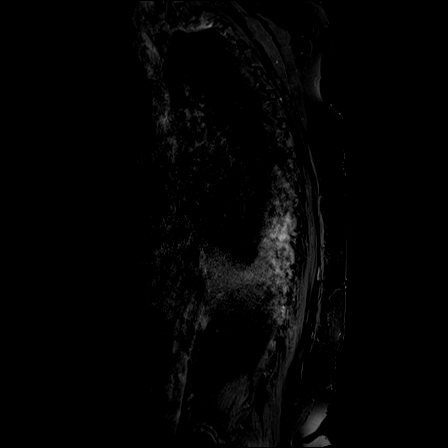

[19 of 48 positions shown; findings below may reference images not displayed]

FINDINGS: Motion artifact is present

Alignment:  Stable.

Vertebrae: Fracture involving the inferior endplate of T10 is again
identified. Additional fracture involving the superior endplate of
T11 is again seen. There is persistent but decreased marrow edema.
Height loss is slightly increased at T11 but remains less than 50%.
Corresponding enhancement is present with some extension into the
posterior elements. Chronic L1 compression fracture.

Cord: No definite abnormal signal. There is dorsal epidural
enhancement at the T9-T11 levels likely related to fractures.

Paraspinal and other soft tissues: Paraspinal soft tissue
enhancement at the fracture levels.

Disc levels: There is persistent canal stenosis at T10-T11 where
there is facet arthropathy with ligamentum flavum thickening. This
appears increased. There is also increased foraminal stenosis at
this level with enhancement noted within the foramina.
IMPRESSION: Fractures of T10 and T11 as noted previously. Slightly increased
height loss at T11.

Increased canal and foraminal stenosis at T10-T11. There is epidural
and foraminal enhancement primarily at the fracture levels that is
presumably inflammatory and related to the fractures in the absence
of clinical suspicion for other processes such as infection.

## 2020-02-21 MED ORDER — FUROSEMIDE 40 MG PO TABS
40.0000 mg | ORAL_TABLET | Freq: Every day | ORAL | Status: DC
  Administered 2020-02-22 – 2020-02-25 (×4): 40 mg via ORAL
  Filled 2020-02-21 (×4): qty 1

## 2020-02-21 MED ORDER — ACETAMINOPHEN 325 MG PO TABS
650.0000 mg | ORAL_TABLET | Freq: Three times a day (TID) | ORAL | Status: DC
  Administered 2020-02-21 – 2020-02-22 (×3): 650 mg via ORAL
  Filled 2020-02-21 (×5): qty 2

## 2020-02-21 MED ORDER — GADOBUTROL 1 MMOL/ML IV SOLN
9.0000 mL | Freq: Once | INTRAVENOUS | Status: AC | PRN
  Administered 2020-02-21: 9 mL via INTRAVENOUS

## 2020-02-21 NOTE — Patient Care Conference (Signed)
Inpatient RehabilitationTeam Conference and Plan of Care Update Date: 02/21/2020   Time: 1:33 PM    Patient Name: Crystal Ashley      Medical Record Number: [id number]  Date of Birth: 01-08-1946 Sex: Female         Room/Bed: 4W03C/4W03C-01 Payor Info: Payor: MEDICARE / Plan: MEDICARE PART A AND B / Product Type: *No Product type* /    Admit Date/Time:  02/12/2020  5:50 PM  Primary Diagnosis:  Multiple traumatic injuries  Hospital Problems: Principal Problem:   Multiple traumatic injuries    Expected Discharge Date: Expected Discharge Date:  (SNF)  Team Members Present: Physician leading conference: Dr. Alysia Penna Care Coodinator Present: Dorien Chihuahua, RN, BSN, CRRN;[name] Ney, [place] Nurse Present: Debroah Loop, RN PT Present: Barrie Folk, PT OT Present: Clyda Greener, OT SLP Present: Jettie Booze, CF-SLP PPS Coordinator present : Gunnar Fusi, SLP     Current Status/Progress Goal Weekly Team Focus  Bowel/Bladder             Swallow/Nutrition/ Hydration             ADL's   min assist for UB bathing with mod to max for LB,  total assist for donning TLSO with min assist for pullover gown.  max assist for donning incontinence brief as well,  Mod assist for functional transfers with use of the RW.  Decreased motivation with decreased initiation and increased pain  min assist to supervision  selfcare retraining, balance retraining, DME education, AE education, pt/family education, transfer training   Mobility   mod assist bed mobility, min up to mod/max assist sit<>stand and stand pivot transfers using RW - pt has had recent decline in functional mobility level the past 3 days requiring up to mod/max assist and has not ambulated in this time  supervision overall  bed mobility, functional transfers, gait training, pt/family education, activity tolerance, discharge planning, B LE strengthening   Communication             Safety/Cognition/ Behavioral  Observations            Pain             Skin               Team Discussion:Decline in function and cognitive deficits noted since last week. Patient regressing, decreased participation, initiation and new incontinence, ataxic gait with poor appetite. Cause for changes noted unclear; possible medication related. MD to check head CT scan.  Discharge Planning/Teaching Needs:  Goal to discharge to SNF 03/01/2020      Current Update: on target  Current Barriers to Discharge:  Lack of/limited family support  Possible Resolutions to Barriers: Discussed burden of care with son and possible SNF option  Patient on target to meet rehab goals: no,  Goals downgraded due to changes in function noted and regression from previous week. Son aware and is in agreement with the changes made.  *See Care Plan and progress notes for long and short-term goals.   Revisions to Treatment Plan:  MD to adjust medications and check head CT.    Medical Summary                 I attest that I was present, lead the team conference, and concur with the assessment and plan of the team.   Dorien Chihuahua B 02/21/2020, 1:33 PM

## 2020-02-21 NOTE — Plan of Care (Signed)
  Problem: RH PAIN MANAGEMENT Goal: RH STG PAIN MANAGED AT OR BELOW PT'S PAIN GOAL Description: < 4 Outcome: Progressing   Problem: RH SAFETY Goal: RH STG ADHERE TO SAFETY PRECAUTIONS W/ASSISTANCE/DEVICE Description: STG Adhere to Safety Precautions With mod I Assistance/Device. Outcome: Progressing

## 2020-02-21 NOTE — Progress Notes (Signed)
Physical Therapy Session Note  Patient Details  Name: Crystal Ashley MRN: [id number] Date of Birth: [birth year]  Today's Date: 02/21/2020 PT Individual Time: 0800-0854 PT Individual Time Calculation (min): 54 min   Short Term Goals: Week 2:  PT Short Term Goal 1 (Week 2): Pt will perform sit<>stand with min assist PT Short Term Goal 2 (Week 2): Pt will perform stand pivot transfers with min assist PT Short Term Goal 3 (Week 2): Pt will ambulate at least 61f with CGA PT Short Term Goal 4 (Week 2): Pt will ascend/descend 4 steps using B HRs with min assist  Skilled Therapeutic Interventions/Progress Updates:    Pt received supine in bed and agreeable to PT with significant encouragement. Pt noted to have been incontinent of bowel and bladder, but apreared to be unaware of incontinence.  Rolling R and L with min-mod assist each direction. Pt performed clothing management and pericare. Noted to have small area of skin break down on sacrum, clear edges, no slough, no drainage. RN made aware. Supine>sit with mod assist overall and max encouragement to participate using lob roll technique. Pt reports increased pain in sitting. Max assist to don TLSO sitting EOB. Stand pivot transfer to WC. With mod assist and BUE supported on RW, noted to have mild ataxia in BLE resulting in excessive step length on the L and requiring assist from PT to prevent knee from collapsing.   RN present to administer pain medication once pt sitting in WC. Pt initially resistant to pain meds, but agreeable once RN educated pt on benefits of remaining properly medicated to allow increased participation in therapy.   Pt then transported to day room. Kinetron reciprocal endurance and sustained attention training to attempt to perform BLE march. Able to tolerate 30 sec wthen 5 bouts of 10 seconds, reporting pain limiting increased time.   Sit<>stand with mod assist and RW, excessive time to initiate and encouragement from PT. Pt  able to take 3 steps with BLE, and mod assist, noted to have very narrow BOS, but no LOB or knee instability. Pt requested to return to sitting due to increasing pain in low back.        [facility] to [facility], call bell in reach, and all needs met.  Pt encouraged to attempt to improve oral intake with food on tray.        Therapy Documentation Precautions:  Precautions Precautions: Back, Fall Required Braces or Orthoses: Spinal Brace Spinal Brace: Thoracolumbosacral orthotic, Applied in sitting position, Other (comment) Spinal Brace Comments: TLSO when OOB Restrictions Weight Bearing Restrictions: No RLE Weight Bearing: Weight bearing as tolerated LLE Weight Bearing: Weight bearing as tolerated Pain: 10/10 with movement in the low back. See eMAR   Therapy/Group: Individual Therapy  ALorie Phenix7/02/2020, 10:08 AM

## 2020-02-21 NOTE — Progress Notes (Signed)
Pt  Refused the scheduled Stool softener and the tylenol. Pt educated on the importance of taking both medications. Pt continued to refuse.

## 2020-02-21 NOTE — Progress Notes (Signed)
Patient ID: Crystal Ashley, female   DOB: 07-16-1946, 74 y.o.   MRN: [id number] Team Conference Report to Patient/Family  Team Conference discussion was reviewed with the patient and caregiver, including goals, any changes in plan of care and target discharge date.  Patient and caregiver express understanding and are in agreement.  The patient has a target discharge date of  (SNF).  Dyanne Iha 02/21/2020, 1:59 PM

## 2020-02-21 NOTE — Progress Notes (Signed)
Pt c/o 10/10 back pain. Pt requested pain medication. After agreeing to Oxy, pt then refused and states "I will be ok". Pt also refused Ortho VS and getting out of the bed to use the [facility] even though she states she needs to have a BM. Pt educated on the Importance of pain control.

## 2020-02-21 NOTE — Progress Notes (Signed)
Occupational Therapy Session Note  Patient Details  Name: Crystal Ashley MRN: [id number] Date of Birth: 19-Aug-1945  Today's Date: 02/21/2020 OT Individual Time: 4888-9169 OT Individual Time Calculation (min): 45 min (make up time)   Short Term Goals: Week 1:  OT Short Term Goal 1 (Week 1): patient will complete bed mobility with min A OT Short Term Goal 1 - Progress (Week 1): Not met OT Short Term Goal 2 (Week 1): patient will tolerate adl w/c level with min A using ADs OT Short Term Goal 2 - Progress (Week 1): Not met OT Short Term Goal 3 (Week 1): patient will complete functional transfers with CG/min A using RW OT Short Term Goal 3 - Progress (Week 1): Not met  Skilled Therapeutic Interventions/Progress Updates:    1:1. Pt received in w/c with MD present. Pt takes medications 1 at a time for FMC/attention/continuation skill demoing impaired focused attention requiring VC for pacing/small sips of water. Pt combs hair with VC for reaching back of head. Pt given sock aide and requires VC for continuation/sequencing use of AE. Pt states, "I dont need this I have grandkids." Pt educated on importance of improving Ind to decrease BOC. No evidence of learning. Pt completes stand pivot transfer to EOB with RW and MAX A overall and pr demo poor awareness/proprioception of feet/hips during transfer with pt leaning back onto OT and not following commands to shift weigth or step feet despite multimodal cuing. Pt requires +2 A at EOB for bed mobility d/t pt sitting at very edge of bed with posterior lean to prevent sliding onto floor. Exited session with pt seated in bed, exit alarm on and son in room.   Therapy Documentation Precautions:  Precautions Precautions: Back, Fall Required Braces or Orthoses: Spinal Brace Spinal Brace: Thoracolumbosacral orthotic, Applied in sitting position, Other (comment) Spinal Brace Comments: TLSO when OOB Restrictions Weight Bearing Restrictions: No RLE Weight  Bearing: Weight bearing as tolerated LLE Weight Bearing: Weight bearing as tolerated General:   Vital Signs:  Pain:   ADL: ADL Eating: Supervision/safety, Set up Where Assessed-Eating: Bed level Grooming: Other (comment) (declined due to fatigue) Upper Body Bathing: Minimal assistance Where Assessed-Upper Body Bathing: Bed level Lower Body Bathing: Dependent Where Assessed-Lower Body Bathing: Bed level Upper Body Dressing: Moderate assistance Where Assessed-Upper Body Dressing: Bed level Lower Body Dressing: Dependent Where Assessed-Lower Body Dressing: Bed level ADL Comments: patient declined OOB for OT eval due to pain and fatigue Vision   Perception    Praxis   Exercises:   Other Treatments:     Therapy/Group: Individual Therapy  Tonny Branch 02/21/2020, 9:49 AM

## 2020-02-21 NOTE — Progress Notes (Addendum)
[facility] PHYSICAL MEDICINE & REHABILITATION PROGRESS NOTE   Subjective/Complaints:   No issues overnite , son feels she is a little slower Has been on tramadol and compazine for associated nausea  ROS: Denies CP, SOB. N/V/D  Objective:   No results found. No results for input(s): WBC, HGB, HCT, PLT in the last 72 hours. Recent Labs    02/19/20 0659  NA 141  K 3.8  CL 103  CO2 28  GLUCOSE 146*  BUN 14  CREATININE 0.93  CALCIUM 8.5*    Intake/Output Summary (Last 24 hours) at 02/21/2020 0819 Last data filed at 02/20/2020 2145 Gross per 24 hour  Intake 240 ml  Output 300 ml  Net -60 ml     Physical Exam: Vital Signs Blood pressure (!) 163/82, pulse 71, temperature 97.6 F (36.4 C), resp. rate 17, height 5' (1.524 m), weight 89.6 kg, SpO2 100 %.   General: No acute distress Mood and affect are appropriate Heart: Regular rate and rhythm no rubs murmurs or extra sounds Lungs: Clear to auscultation, breathing unlabored, no rales or wheezes Abdomen: Positive bowel sounds, soft nontender to palpation, nondistended Extremities: No clubbing, cyanosis, or edema Skin: No evidence of breakdown, no evidence of rash  Musculoskeletal: Full range of motion in all 4 extremities. No joint swelling Posture appropriate Psych: Pt's affect is appropriate. Pt is cooperative   Assessment/Plan: 1. Functional deficits secondary to multiple fractures following fall, which require 3+ hours per day of interdisciplinary therapy in a comprehensive inpatient rehab setting.  Needing 15/7 schedule- very sedentay PTA  Physiatrist is providing close team supervision and 24 hour management of active medical problems listed below.  Physiatrist and rehab team continue to assess barriers to discharge/monitor patient progress toward functional and medical goals  Care Tool:  Bathing    Body parts bathed by patient: Right arm, Left arm, Chest, Abdomen, Right upper leg, Left upper leg, Face    Body parts bathed by helper: Buttocks, Front perineal area Body parts n/a: Right lower leg, Left lower leg   Bathing assist Assist Level: Moderate Assistance - Patient 50 - 74%     Upper Body Dressing/Undressing Upper body dressing Upper body dressing/undressing activity did not occur (including orthotics): N/A What is the patient wearing?: Dress    Upper body assist Assist Level: Minimal Assistance - Patient > 75%    Lower Body Dressing/Undressing Lower body dressing      What is the patient wearing?: Incontinence brief     Lower body assist Assist for lower body dressing: Maximal Assistance - Patient 25 - 49%     Toileting Toileting    Toileting assist Assist for toileting: Maximal Assistance - Patient 25 - 49%     Transfers Chair/bed transfer  Transfers assist     Chair/bed transfer assist level: Moderate Assistance - Patient 50 - 74% Chair/bed transfer assistive device: Museum/gallery exhibitions officer assist      Assist level: Minimal Assistance - Patient > 75% Assistive device: Parallel bars Max distance: 8   Walk 10 feet activity   Assist  Walk 10 feet activity did not occur: Safety/medical concerns  Assist level: Minimal Assistance - Patient > 75% Assistive device: Walker-rolling   Walk 50 feet activity   Assist Walk 50 feet with 2 turns activity did not occur: Safety/medical concerns  Assist level: Minimal Assistance - Patient > 75% Assistive device: Walker-rolling    Walk 150 feet activity   Assist Walk 150 feet activity  did not occur: Safety/medical concerns         Walk 10 feet on uneven surface  activity   Assist Walk 10 feet on uneven surfaces activity did not occur: Safety/medical concerns         Wheelchair     Assist Will patient use wheelchair at discharge?: No (if used w/c would be for dependent community transport)             Wheelchair 50 feet with 2 turns activity    Assist             Wheelchair 150 feet activity     Assist          Blood pressure (!) 163/82, pulse 71, temperature 97.6 F (36.4 C), resp. rate 17, height 5' (1.524 m), weight 89.6 kg, SpO2 100 %.  Medical Problem List and Plan: 1.  Impaired mobility and ADLs secondary to multiple traumatic injuries following fall, possibly secondary to starting Cardizem.              CIR PT, OT, Team conference today please see physician documentation under team conference tab, met with team  to discuss problems,progress, and goals. Formulized individual treatment plan based on medical history, underlying problem and comorbidities.  Some decline in therapy , incontinence, per son/therapy appears "slower", neuro exam unchanged, will check CT head givne that she is on Xarelto D/C tramadol and compazine, Check UA C and S              -ELOS/Goals: 10-14 days modI PT, OT, I in SLP 2.  Antithrombotics: -DVT/anticoagulation:  Pharmaceutical: Xarelto , Hgb stable              -antiplatelet therapy:N/a 3. Pain Management: Patient feels nauseous with opioids but pain is significantly limiting therapy. Scheduled Tylenol and added lidocaine patch and kpad for lumbar paraspinals where pain is worst. Addendum: She is willing to try Tramadol- will see how she tolerates 4. Mood: Team to provide ego support. LCSW to follow for evaluation and support.              -antipsychotic agents: N/A 5. Neuropsych: This patient is capable of making decisions on her own behalf. 6. Skin/Wound Care: Routine pressure relief measures.  7. Fluids/Electrolytes/Nutrition: Monitor I/O. Check lytes in am.  8. T10/T11 fractures: TLSO when at edge of bed and out of bed. Encouraged to wear in bed for support comfort if tolerated.  9. Right pelvic fracture: WBAT 10. T2DM: Hgb A1c- 6.8. will monitor BS ac/hs. Resume metformin once intake improves.  6/29: CBGs poorly controled. Will monitor with increased mobility.  CBG (last 3)  Recent Labs     02/20/20 1654 02/20/20 2131 02/21/20 0543  GLUCAP 182* 163* 141*  7/7: well controlled 11. PAF: Monitor HR tid--continue Xarelto, Cardizem and Metoprolol XL daily.  Vitals:   02/20/20 1928 02/21/20 0411  BP: (!) 144/114 (!) 163/82  Pulse: 61 71  Resp:    Temp: 97.9 F (36.6 C) 97.6 F (36.4 C)  SpO2: 96% 100%  remains elevated  , will increase furosemide to home dose 16m  12. ABLA: Due to fracture/hematoma. Stable at 9.5 13. Leucocytosis: resolved  14. Vitamin D deficiency: was on supplement at home. Will add Vitamin D level to 6/29 labs.  6/29: Level is normal. Will not restart supplement.  15. Vitamin B12 deficiency: was on supplement at home. Will add Vitamin B level to 6/29 labs.   6/29: Level is normal. Will not restart  supplement.  15. Disposition: Patient lives with her son and his fiance.  7/5: Left voicemail for son to inform him that we scheduled Tylenol as per his request.     LOS: 9 days A FACE TO Villalba E Nicie Milan 02/21/2020, 8:19 AM

## 2020-02-21 NOTE — Progress Notes (Signed)
Occupational Therapy Session Note  Patient Details  Name: Crystal Ashley MRN: [id number] Date of Birth: 10-Mar-1946  Today's Date: 02/21/2020 OT Individual Time: 1355-1436 OT Individual Time Calculation (min): 41 min    Short Term Goals: Week 2:  OT Short Term Goal 1 (Week 2): patient will complete functional transfers with CG/min A using RW OT Short Term Goal 2 (Week 2): patient will tolerate adl w/c level with min A using ADs OT Short Term Goal 3 (Week 2): patient will complete bed mobility with min A OT Short Term Goal 4 (Week 2): Patient with donn TLSO with mod assist for two consecutive sessions.  Skilled Therapeutic Interventions/Progress Updates:    Pt completed supine to sit EOB with mod assist to begin session.  She was then able to transition to sitting with mod assist.  Min to mod assist needed for static sitting balance secondary to posterior LOB.  She needed use of the bed rail on one side and the EOB on the other with her UEs to maintain support.  She needed total assist for donning the TLSO in sitting with max assist for sit to stand from the EOB and for pivot transfer to the wheelchair with use of the RW.  Noted bilateral knee buckling during transfer.  Took pt down to the therapy gym where therapist attempted to have her complete sit to stand transitions and standing with unilateral UE use.  She needed max assist for initial sit to stand.  When attempting to step forward to get closer to the board, she exhibited bilateral knee buckling, requiring total assist from therapist to get her back into the chair so she didn't fall on the floor.  On the second attempt, she was unable to achieve full sit to stand secondary to bilateral LE weakness.  Returned to the room via wheelchair with nursing, PA, and pt's son made aware of her increased weakness with attempted sit to stand transitions.  The pt was left up in the wheelchair with the call button and phone in reach and safety belt in  place.  Nursing and NT made aware of pt needing use of the bariatric stedy and +2 assist for transfer back to the bed after eating.    Therapy Documentation Precautions:  Precautions Precautions: Back, Fall Required Braces or Orthoses: Spinal Brace Spinal Brace: Thoracolumbosacral orthotic, Applied in sitting position, Other (comment) Spinal Brace Comments: TLSO when OOB Restrictions Weight Bearing Restrictions: No RLE Weight Bearing: Weight bearing as tolerated LLE Weight Bearing: Weight bearing as tolerated  Pain: Pain Assessment Pain Scale: Faces Faces Pain Scale: Hurts a little bit Pain Type: Acute pain Pain Location: Back Pain Orientation: Mid Pain Descriptors / Indicators: Discomfort Pain Onset: With Activity Pain Intervention(s): Repositioned ADL: See Care Tool Section for some details of mobility and selfcare  Therapy/Group: Individual Therapy  Aneesha Holloran OTR/L 02/21/2020, 2:51 PM

## 2020-02-21 NOTE — Progress Notes (Signed)
Pt arrived back to unit fromMRI. Pt A&Ox4. Resting in bed

## 2020-02-22 ENCOUNTER — Inpatient Hospital Stay (HOSPITAL_COMMUNITY): Payer: Medicare Other | Admitting: Speech Pathology

## 2020-02-22 ENCOUNTER — Other Ambulatory Visit: Payer: Self-pay | Admitting: Neurosurgery

## 2020-02-22 ENCOUNTER — Inpatient Hospital Stay (HOSPITAL_COMMUNITY): Payer: Medicare Other

## 2020-02-22 ENCOUNTER — Inpatient Hospital Stay (HOSPITAL_COMMUNITY): Payer: Medicare Other | Admitting: Occupational Therapy

## 2020-02-22 ENCOUNTER — Inpatient Hospital Stay (HOSPITAL_COMMUNITY): Payer: Medicare Other | Admitting: Physical Therapy

## 2020-02-22 LAB — URINALYSIS, ROUTINE W REFLEX MICROSCOPIC
Bilirubin Urine: NEGATIVE
Glucose, UA: NEGATIVE mg/dL
Ketones, ur: NEGATIVE mg/dL
Nitrite: POSITIVE — AB
Protein, ur: NEGATIVE mg/dL
Specific Gravity, Urine: 1.01 (ref 1.005–1.030)
pH: 6 (ref 5.0–8.0)

## 2020-02-22 LAB — CBC WITH DIFFERENTIAL/PLATELET
Abs Immature Granulocytes: 0.06 10*3/uL (ref 0.00–0.07)
Basophils Absolute: 0 10*3/uL (ref 0.0–0.1)
Basophils Relative: 0 %
Eosinophils Absolute: 0.2 10*3/uL (ref 0.0–0.5)
Eosinophils Relative: 2 %
HCT: 35.8 % — ABNORMAL LOW (ref 36.0–46.0)
Hemoglobin: 10.6 g/dL — ABNORMAL LOW (ref 12.0–15.0)
Immature Granulocytes: 1 %
Lymphocytes Relative: 20 %
Lymphs Abs: 1.8 10*3/uL (ref 0.7–4.0)
MCH: 26.4 pg (ref 26.0–34.0)
MCHC: 29.6 g/dL — ABNORMAL LOW (ref 30.0–36.0)
MCV: 89.1 fL (ref 80.0–100.0)
Monocytes Absolute: 0.5 10*3/uL (ref 0.1–1.0)
Monocytes Relative: 6 %
Neutro Abs: 6.3 10*3/uL (ref 1.7–7.7)
Neutrophils Relative %: 71 %
Platelets: 303 10*3/uL (ref 150–400)
RBC: 4.02 MIL/uL (ref 3.87–5.11)
RDW: 17.4 % — ABNORMAL HIGH (ref 11.5–15.5)
WBC: 8.9 10*3/uL (ref 4.0–10.5)
nRBC: 0 % (ref 0.0–0.2)

## 2020-02-22 LAB — GLUCOSE, CAPILLARY
Glucose-Capillary: 152 mg/dL — ABNORMAL HIGH (ref 70–99)
Glucose-Capillary: 158 mg/dL — ABNORMAL HIGH (ref 70–99)
Glucose-Capillary: 165 mg/dL — ABNORMAL HIGH (ref 70–99)
Glucose-Capillary: 160 mg/dL — ABNORMAL HIGH (ref 70–99)

## 2020-02-22 IMAGING — CT CT T SPINE W/O CM
2 of 7 series · 11 of 33 positions shown, 14 images · non-contrast
Comparison: Thoracic spine MRI [DATE]

Thoracolumbar spine CT [DATE]

CLINICAL DATA: Spinal fracture

EXAM:
CT THORACIC AND LUMBAR SPINE WITHOUT CONTRAST
TECHNIQUE: Multidetector CT imaging of the thoracic and lumbar spine was
performed without contrast. Multiplanar CT image reconstructions
were also generated.

[Series 11: t-spine 1.0 st · axial · 0.37mm/px · z∈[+1031,+1371]mm · 6 of 477 slices shown, 8 images]
[im 69/477  soft-tissue]
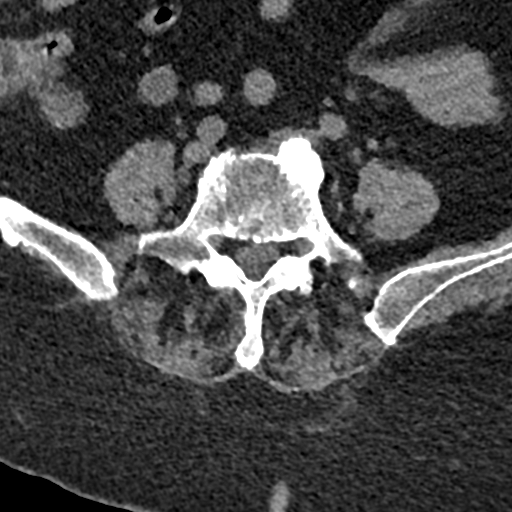
[im 69/477  bone]
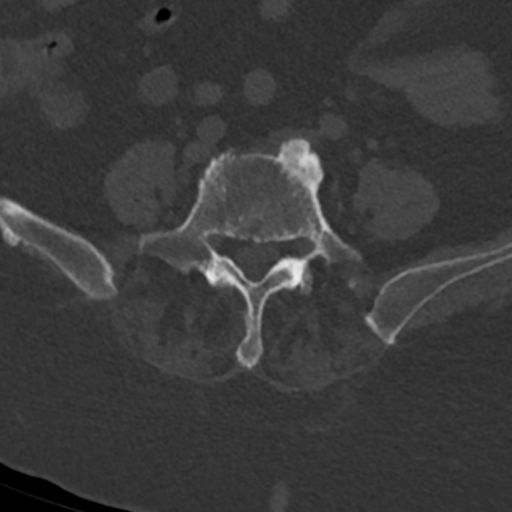
[im 137/477  bone]
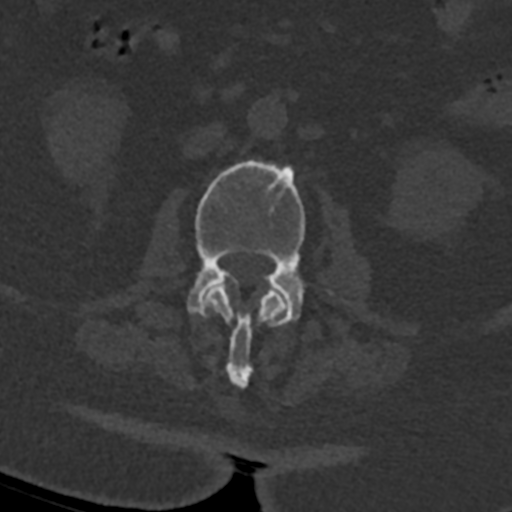
[im 205/477  bone]
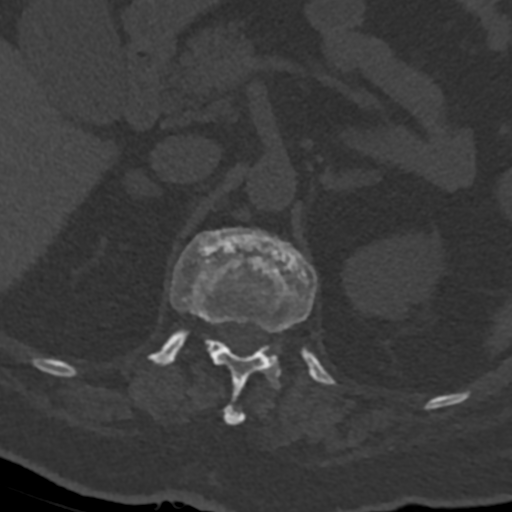
[im 273/477  bone]
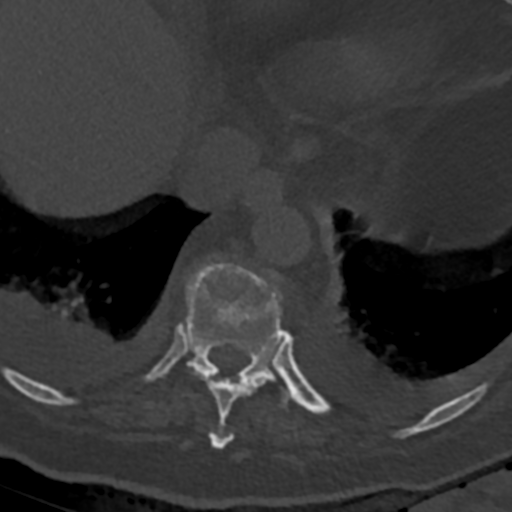
[im 341/477  soft-tissue]
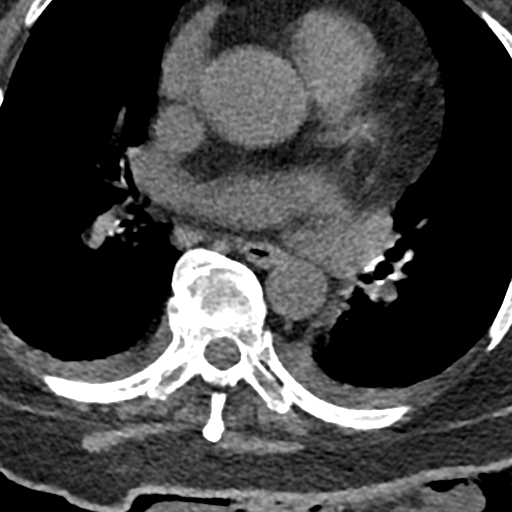
[im 341/477  bone]
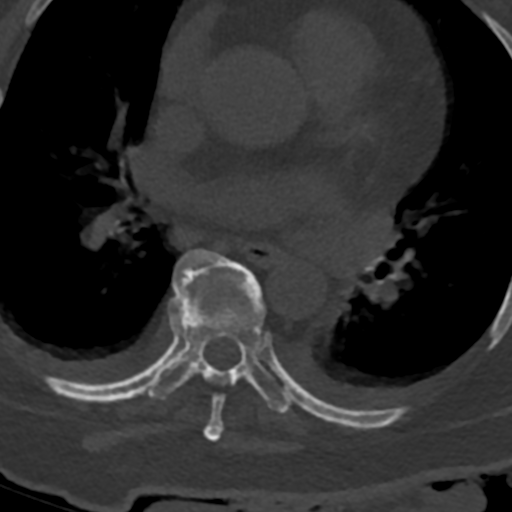
[im 409/477  bone]
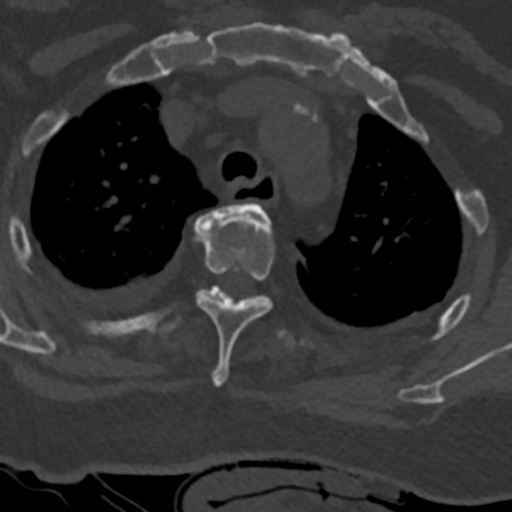

[Series 16: t-spine 2.0 sag st · sagittal · 0.36mm/px · 5 of 102 slices shown, 6 images]
[im 34/102  bone]
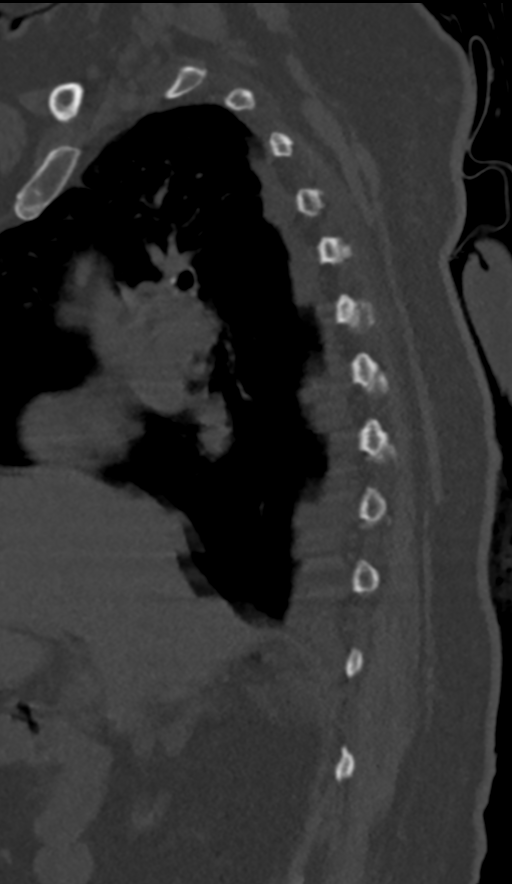
[im 43/102  bone]
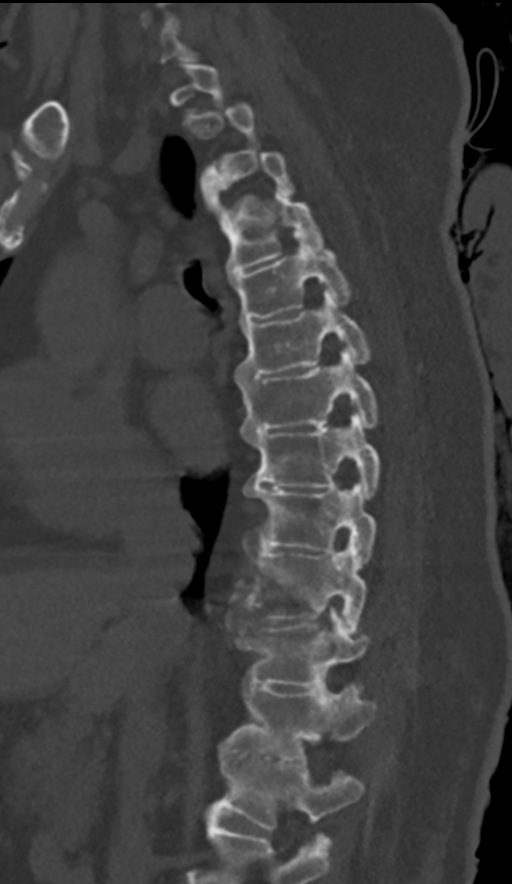
[im 51/102  soft-tissue]
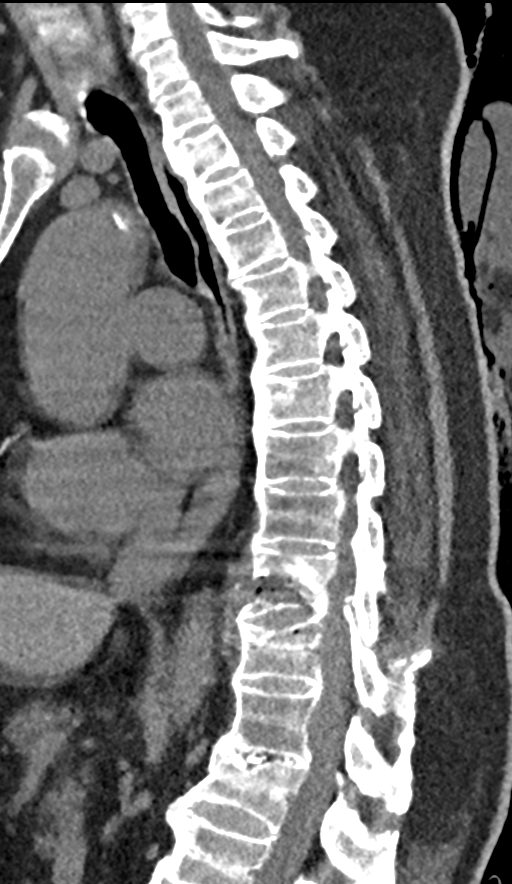
[im 51/102  bone]
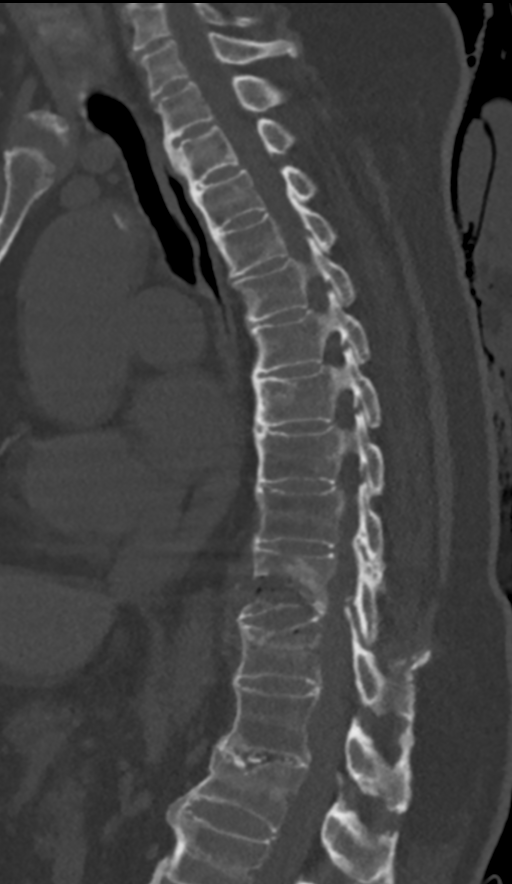
[im 59/102  bone]
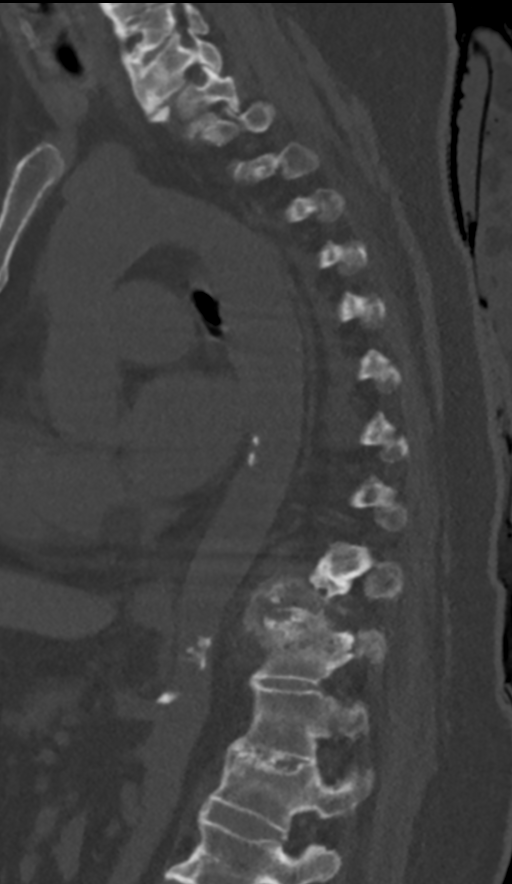
[im 68/102  bone]
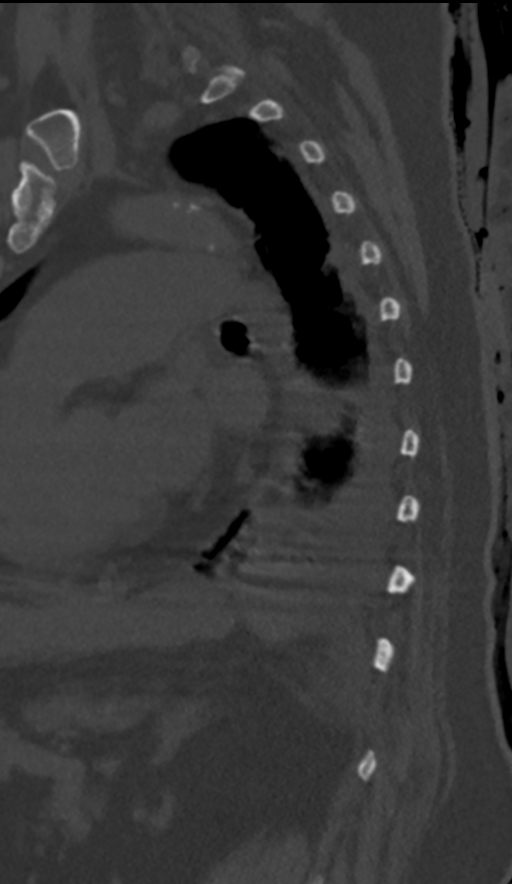

[11 of 33 positions shown; findings below may reference images not displayed]

FINDINGS: CT THORACIC SPINE FINDINGS

Alignment: Normal.

Vertebrae: Fracture of T10 with anterior distraction that now
measures 11 mm, previously 8 mm. Mild progression of height loss at
T11. There are flowing anterior syndesmophytes along the length of
thoracic spine.

Paraspinal and other soft tissues: Bilateral pleural effusions.
Cardiomegaly. Mild pulmonary edema.

Disc levels: There is moderate spinal canal stenosis at the T10-11
level. There is also severe bilateral foraminal stenosis at this
level.

CT LUMBAR SPINE FINDINGS

Segmentation: 5 lumbar type vertebrae.

Alignment: Normal.

Vertebrae: Chronic height loss at L1 of approximately 10%
anteriorly. No retropulsion.

Paraspinal and other soft tissues: Calcific aortic atherosclerosis.

Disc levels:

T12-L1: Mild spinal canal stenosis due to disc bulge and facet
hypertrophy.

L1-2: No spinal canal stenosis or neural impingement.

L2-3: No spinal canal stenosis or neural impingement.

L3-4: Mild spinal canal stenosis due to facet hypertrophy and disc
bulge.

L4-5: Moderate spinal canal stenosis due to disc bulge and facet
hypertrophy. Mild right and severe left foraminal stenosis.

L5-S1: Disc space narrowing mild spinal canal stenosis. Mild right
and moderate left foraminal stenosis.
IMPRESSION: 1. T10 fracture with anterior distraction that now measures 11 mm,
previously 8 mm.
2. Mild progression of height loss of the T11 compression fracture.
3. Moderate spinal canal stenosis and severe bilateral foraminal
stenosis at T10-11.
4. Severe left foraminal stenosis at L4-5 due to disc bulge and
facet hypertrophy.
5. Chronic height loss at L1 of approximately 10% anteriorly.
6. Bilateral pleural effusions and mild pulmonary edema.
7. Flowing anterior syndesmophytes along the length of the thoracic
spine).
8. Aortic Atherosclerosis ([VS]-[VS]).

## 2020-02-22 IMAGING — CT CT L SPINE W/O CM
3 series · 13 of 33 positions shown, 16 images · non-contrast
Comparison: Thoracic spine MRI [DATE]

Thoracolumbar spine CT [DATE]

CLINICAL DATA: Spinal fracture

EXAM:
CT THORACIC AND LUMBAR SPINE WITHOUT CONTRAST
TECHNIQUE: Multidetector CT imaging of the thoracic and lumbar spine was
performed without contrast. Multiplanar CT image reconstructions
were also generated.

[Series 4: t-spine 1.0 st · axial · 0.37mm/px · z∈[+1036,+1365]mm · 5 of 477 slices shown, 7 images]
[im 74/477  soft-tissue]
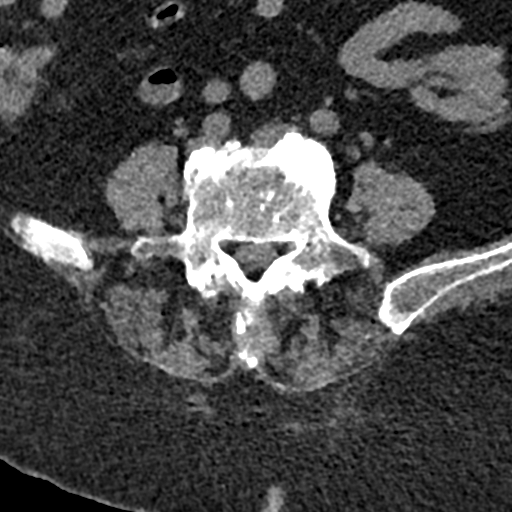
[im 74/477  bone]
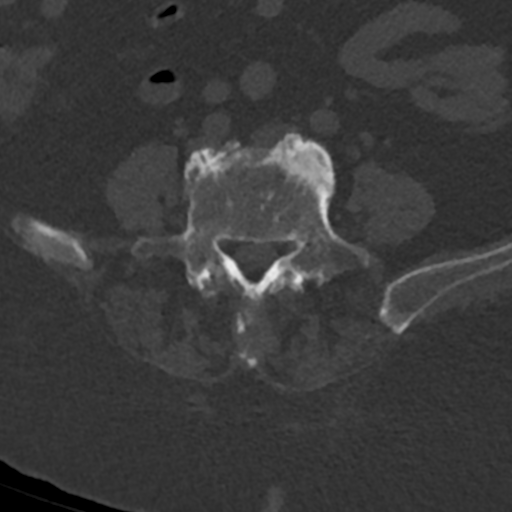
[im 147/477  bone]
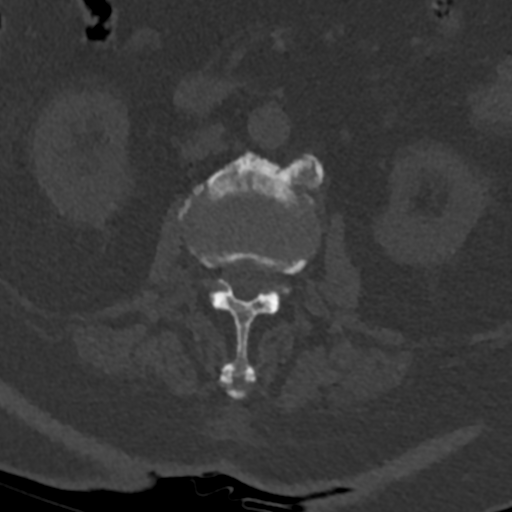
[im 257/477  bone]
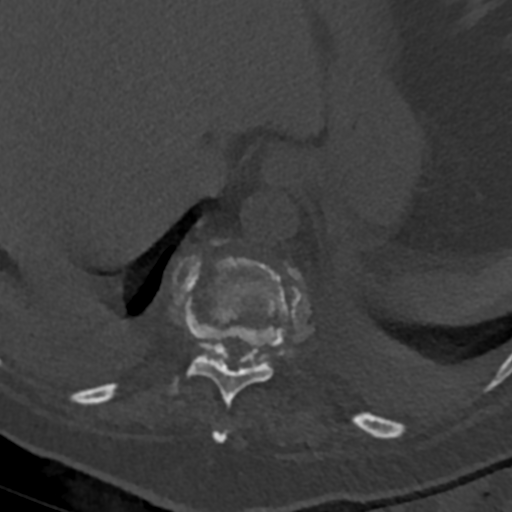
[im 330/477  bone]
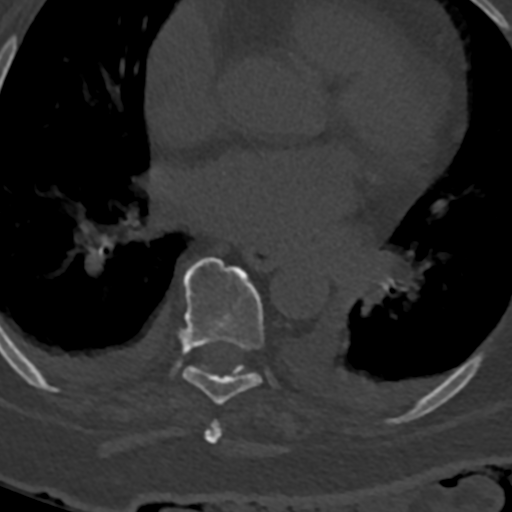
[im 403/477  soft-tissue]
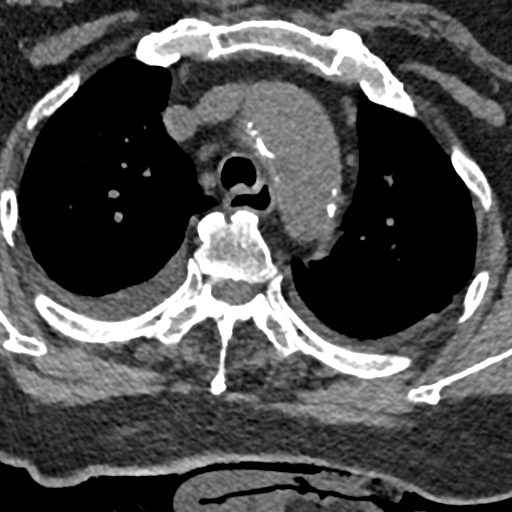
[im 403/477  bone]
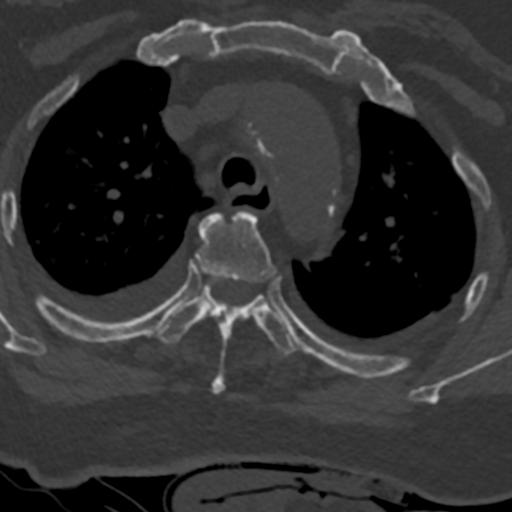

[Series 7: coronal st · coronal · 0.45mm/px · 3 of 82 slices shown]
[im 17/82  bone]
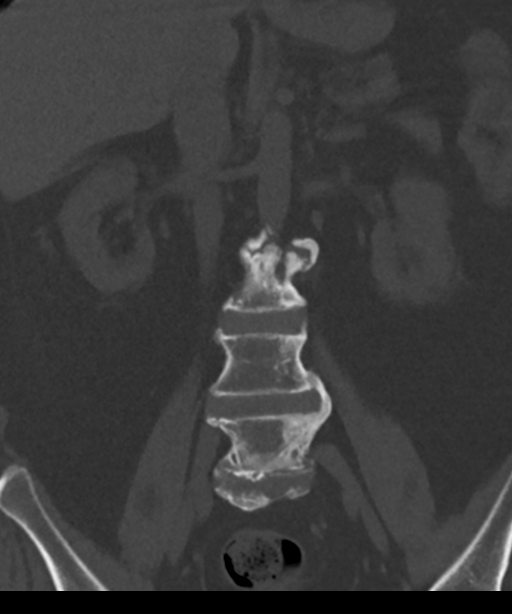
[im 33/82  bone]
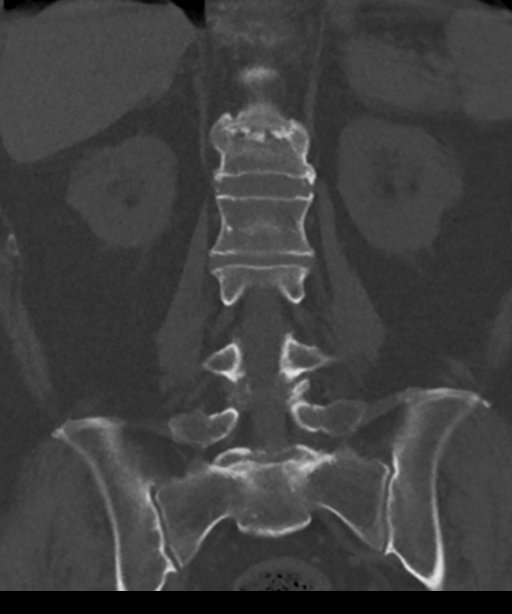
[im 49/82  bone]
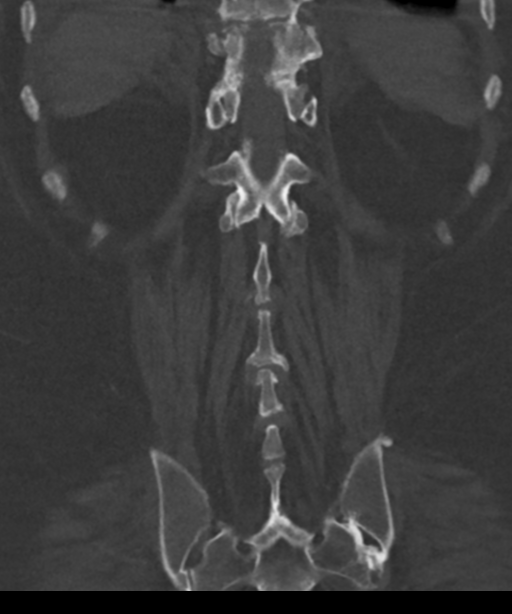

[Series 8: sagittal st · sagittal · 0.42mm/px · 5 of 61 slices shown, 6 images]
[im 21/61  bone]
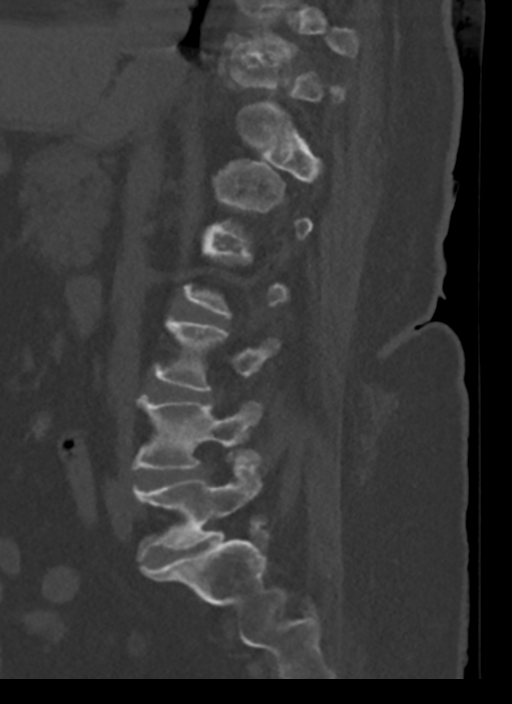
[im 26/61  bone]
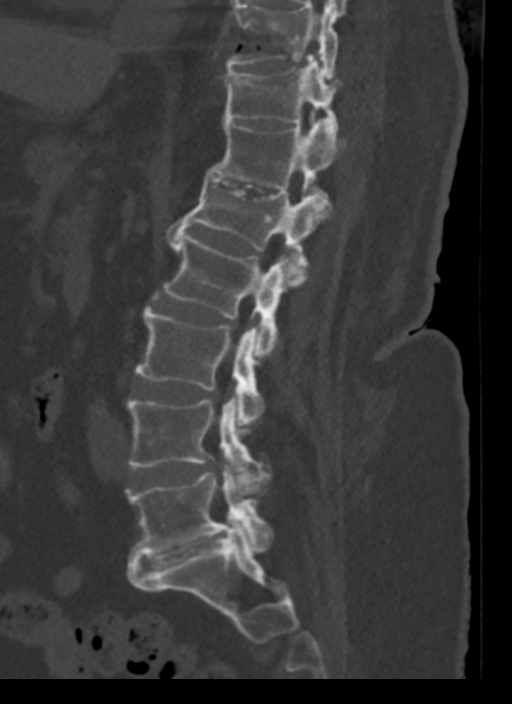
[im 31/61  soft-tissue]
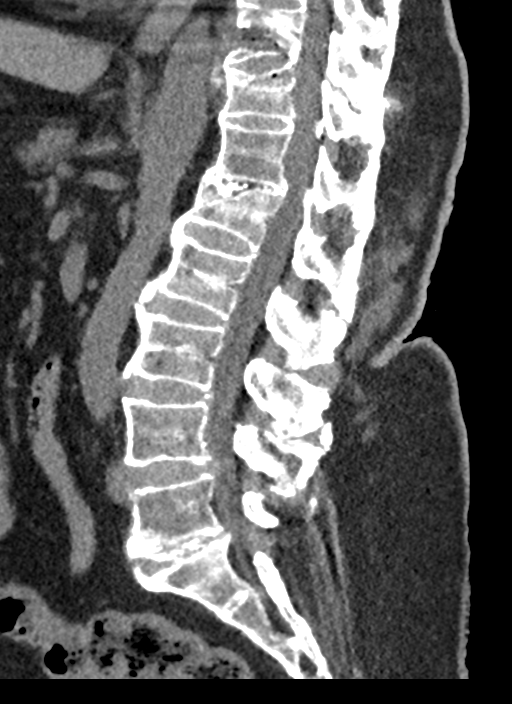
[im 31/61  bone]
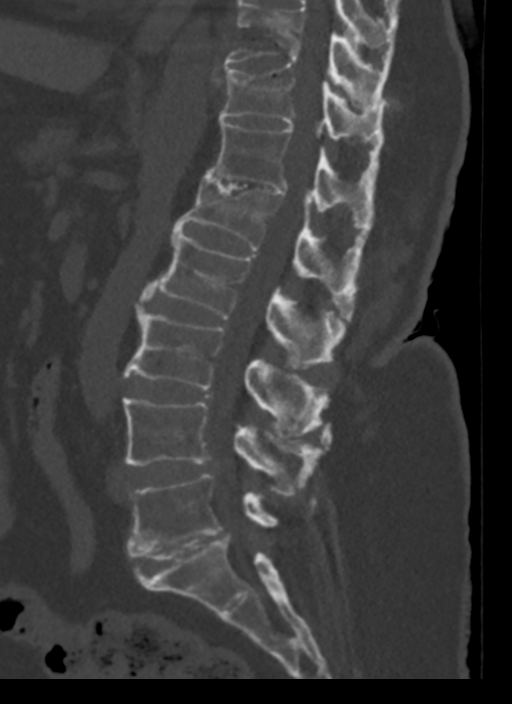
[im 36/61  bone]
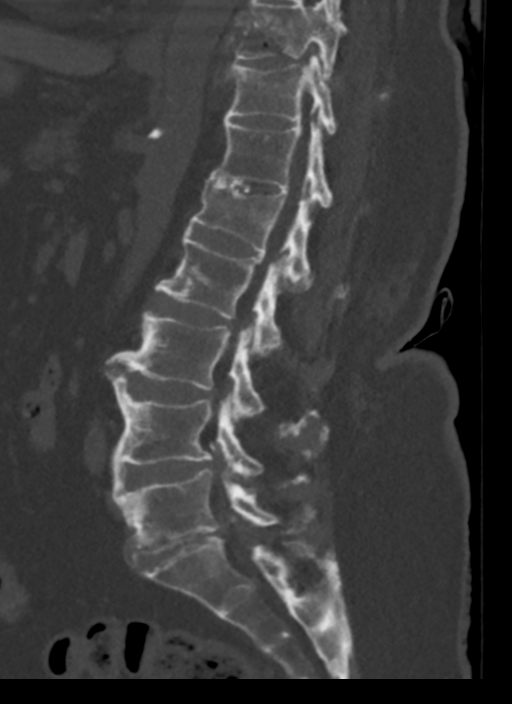
[im 41/61  bone]
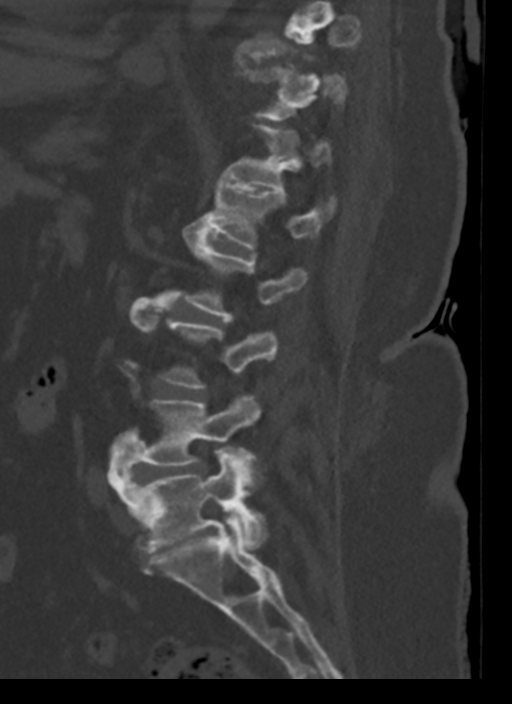

[13 of 33 positions shown; findings below may reference images not displayed]

FINDINGS: CT THORACIC SPINE FINDINGS

Alignment: Normal.

Vertebrae: Fracture of T10 with anterior distraction that now
measures 11 mm, previously 8 mm. Mild progression of height loss at
T11. There are flowing anterior syndesmophytes along the length of
thoracic spine.

Paraspinal and other soft tissues: Bilateral pleural effusions.
Cardiomegaly. Mild pulmonary edema.

Disc levels: There is moderate spinal canal stenosis at the T10-11
level. There is also severe bilateral foraminal stenosis at this
level.

CT LUMBAR SPINE FINDINGS

Segmentation: 5 lumbar type vertebrae.

Alignment: Normal.

Vertebrae: Chronic height loss at L1 of approximately 10%
anteriorly. No retropulsion.

Paraspinal and other soft tissues: Calcific aortic atherosclerosis.

Disc levels:

T12-L1: Mild spinal canal stenosis due to disc bulge and facet
hypertrophy.

L1-2: No spinal canal stenosis or neural impingement.

L2-3: No spinal canal stenosis or neural impingement.

L3-4: Mild spinal canal stenosis due to facet hypertrophy and disc
bulge.

L4-5: Moderate spinal canal stenosis due to disc bulge and facet
hypertrophy. Mild right and severe left foraminal stenosis.

L5-S1: Disc space narrowing mild spinal canal stenosis. Mild right
and moderate left foraminal stenosis.
IMPRESSION: 1. T10 fracture with anterior distraction that now measures 11 mm,
previously 8 mm.
2. Mild progression of height loss of the T11 compression fracture.
3. Moderate spinal canal stenosis and severe bilateral foraminal
stenosis at T10-11.
4. Severe left foraminal stenosis at L4-5 due to disc bulge and
facet hypertrophy.
5. Chronic height loss at L1 of approximately 10% anteriorly.
6. Bilateral pleural effusions and mild pulmonary edema.
7. Flowing anterior syndesmophytes along the length of the thoracic
spine).
8. Aortic Atherosclerosis ([VS]-[VS]).

## 2020-02-22 MED ORDER — ACETAMINOPHEN 325 MG PO TABS
650.0000 mg | ORAL_TABLET | Freq: Four times a day (QID) | ORAL | Status: DC | PRN
  Administered 2020-02-22: 650 mg via ORAL

## 2020-02-22 MED ORDER — TRAMADOL-ACETAMINOPHEN 37.5-325 MG PO TABS
1.0000 | ORAL_TABLET | Freq: Three times a day (TID) | ORAL | Status: DC
  Administered 2020-02-22 – 2020-02-23 (×2): 1 via ORAL
  Filled 2020-02-22 (×3): qty 1

## 2020-02-22 NOTE — Progress Notes (Addendum)
Discussed patient with neurosurgery, Dr. Marcello Moores.  Given increasing pain with difficulty walking, percutaneous lower thoracic fusion recommended.  Family is discussing this.  Surgery tentatively scheduled for Monday.  Would need to stop anticoagulant and use Lovenox or heparin bridge per pharmacy protocol.  Will first need patient and family to consent to surgery, prior to stopping anticoagulation. No need to alter therapy schedule. Will resume schedule tramadol

## 2020-02-22 NOTE — Progress Notes (Addendum)
[place] PHYSICAL MEDICINE & REHABILITATION PROGRESS NOTE   Subjective/Complaints:   Per PT/OT had LE weakness, buckling during therapy.  Pt has back pain but no LE pain  No numbness or tingling  Cont of bladder , incont of bowel 7/7 but had loose stoll related to laxative   Spoke with son about finding of testing   ROS: Denies CP, SOB. N/V/D  Objective:   CT HEAD WO CONTRAST  Result Date: 02/21/2020 CLINICAL DATA:  Change in mental status EXAM: CT HEAD WITHOUT CONTRAST TECHNIQUE: Contiguous axial images were obtained from the base of the skull through the vertex without intravenous contrast. COMPARISON:  01/31/2020 FINDINGS: Brain: There is no acute intracranial hemorrhage, mass effect, or edema. No new loss of gray-white differentiation. There is no extra-axial fluid collection. Prominence of the ventricles and sulci reflects stable parenchymal volume loss. Patchy hypoattenuation in the supratentorial white matter is nonspecific but probably reflects stable chronic microvascular ischemic changes Vascular: There is atherosclerotic calcification at the skull base. Skull: Calvarium is unremarkable. Sinuses/Orbits: Increased retained secretions within the left sphenoid sinus. No acute orbital abnormality. Other: None. IMPRESSION: No acute intracranial abnormality. Stable chronic findings detailed above. Electronically Signed   By: Macy Mis M.D.   On: 02/21/2020 16:31   MR THORACIC SPINE W WO CONTRAST  Result Date: 02/21/2020 CLINICAL DATA:  High-grade with marrow edema remains with stable height loss tubular stool marrow edema EXAM: MRI THORACIC WITHOUT AND WITH CONTRAST TECHNIQUE: Multiplanar and multiecho pulse sequences of the thoracic spine were obtained without and with intravenous contrast. CONTRAST:  11mL GADAVIST GADOBUTROL 1 MMOL/ML IV SOLN COMPARISON:  02/08/2020 FINDINGS: Motion artifact is present Alignment:  Stable. Vertebrae: Fracture involving the inferior endplate of T90 is  again identified. Additional fracture involving the superior endplate of Z00 is again seen. There is persistent but decreased marrow edema. Height loss is slightly increased at T11 but remains less than 50%. Corresponding enhancement is present with some extension into the posterior elements. Chronic L1 compression fracture. Cord: No definite abnormal signal. There is dorsal epidural enhancement at the T9-T11 levels likely related to fractures. Paraspinal and other soft tissues: Paraspinal soft tissue enhancement at the fracture levels. Disc levels: There is persistent canal stenosis at T10-T11 where there is facet arthropathy with ligamentum flavum thickening. This appears increased. There is also increased foraminal stenosis at this level with enhancement noted within the foramina. IMPRESSION: Fractures of T10 and T11 as noted previously. Slightly increased height loss at T11. 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 There is epidural and foraminal enhancement primarily at the fracture levels that is presumably inflammatory and related to the fractures in the absence of clinical suspicion for other processes such as infection. Electronically Signed   By: Macy Mis M.D.   On: 02/21/2020 20:58   No results for input(s): WBC, HGB, HCT, PLT in the last 72 hours. No results for input(s): NA, K, CL, CO2, GLUCOSE, BUN, CREATININE, CALCIUM in the last 72 hours.  Intake/Output Summary (Last 24 hours) at 02/22/2020 0743 Last data filed at 02/22/2020 0139 Gross per 24 hour  Intake 240 ml  Output 500 ml  Net -260 ml     Physical Exam: Vital Signs Blood pressure 139/82, pulse 62, temperature (!) 97.5 F (36.4 C), temperature source Oral, resp. rate 20, height 5' (1.524 m), weight 90.1 kg, SpO2 99 %.   General: No acute distress Mood and affect are appropriate Heart: Regular rate and rhythm no rubs murmurs or extra sounds  Lungs: Clear to auscultation, breathing unlabored, no rales or  wheezes Abdomen: Positive bowel sounds, soft nontender to palpation, nondistended Extremities: No clubbing, cyanosis, or edema Skin: No evidence of breakdown, no evidence of rash Neuro:  Motor 4/5 bilateral delt , bi, tri grip, 3- HF, KE, ADF (? Full effort)  Sensation intact to LT and proprioception bilateral LEs  Musculoskeletal: Full range of motion in all 4 extremities. No joint swelling Psych: Pt's affect is appropriate. Pt is cooperative   Assessment/Plan: 1. Functional deficits secondary to multiple fractures following fall, which require 3+ hours per day of interdisciplinary therapy in a comprehensive inpatient rehab setting.  Needing 15/7 schedule- very sedentay PTA  Physiatrist is providing close team supervision and 24 hour management of active medical problems listed below.  Physiatrist and rehab team continue to assess barriers to discharge/monitor patient progress toward functional and medical goals  Care Tool:  Bathing    Body parts bathed by patient: Right arm, Left arm, Chest, Abdomen, Right upper leg, Left upper leg, Face   Body parts bathed by helper: Buttocks, Front perineal area Body parts n/a: Right lower leg, Left lower leg   Bathing assist Assist Level: Moderate Assistance - Patient 50 - 74%     Upper Body Dressing/Undressing Upper body dressing Upper body dressing/undressing activity did not occur (including orthotics): N/A What is the patient wearing?: Dress    Upper body assist Assist Level: Minimal Assistance - Patient > 75%    Lower Body Dressing/Undressing Lower body dressing      What is the patient wearing?: Incontinence brief     Lower body assist Assist for lower body dressing: Maximal Assistance - Patient 25 - 49%     Toileting Toileting    Toileting assist Assist for toileting: Maximal Assistance - Patient 25 - 49%     Transfers Chair/bed transfer  Transfers assist     Chair/bed transfer assist level: Maximal Assistance -  Patient 25 - 49% Chair/bed transfer assistive device: Programmer, multimedia   Ambulation assist      Assist level: Minimal Assistance - Patient > 75% Assistive device: Parallel bars Max distance: 8   Walk 10 feet activity   Assist  Walk 10 feet activity did not occur: Safety/medical concerns  Assist level: Minimal Assistance - Patient > 75% Assistive device: Walker-rolling   Walk 50 feet activity   Assist Walk 50 feet with 2 turns activity did not occur: Safety/medical concerns  Assist level: Minimal Assistance - Patient > 75% Assistive device: Walker-rolling    Walk 150 feet activity   Assist Walk 150 feet activity did not occur: Safety/medical concerns         Walk 10 feet on uneven surface  activity   Assist Walk 10 feet on uneven surfaces activity did not occur: Safety/medical concerns         Wheelchair     Assist Will patient use wheelchair at discharge?: No (if used w/c would be for dependent community transport)             Wheelchair 50 feet with 2 turns activity    Assist            Wheelchair 150 feet activity     Assist          Blood pressure 139/82, pulse 62, temperature (!) 97.5 F (36.4 C), temperature source Oral, resp. rate 20, height 5' (1.524 m), weight 90.1 kg, SpO2 99 %.  Medical Problem List and Plan: 1.  Impaired mobility and ADLs secondary to multiple traumatic injuries following fall, possibly secondary to starting Cardizem.              CIR PT, OT, MRI THoracic shows increased inflammation at T10-11 that may be causing increase stenosis in central canal and foramina at previous fx site, not felt to be infectious, no retropulsion of bone fragments Have contacted NS to review - May cont therapy without new restriction   Some decline in therapy , incontinence, per son/therapy appears "slower", neuro exam unchanged, will check CT head givne that she is on Xarelto D/C tramadol and  compazine, Check UA C and S   Discussed with NS , Dr Marcello Moores, who will see the pt, pt with complicating factors of DISH/AS, as well as osteoporosis, eval for potential cemented fusion , Kyphoplasty likely not an option since this is not a simple compression fx             - 2.  Antithrombotics: -DVT/anticoagulation:  Pharmaceutical: Xarelto , Hgb stable              -antiplatelet therapy:N/a 3. Pain Management: Patient feels nauseous with opioids but pain is significantly limiting therapy. Scheduled Tylenol and added lidocaine patch and kpad for lumbar paraspinals where pain is worst.  4. Mood: Team to provide ego support. LCSW to follow for evaluation and support.              -antipsychotic agents: N/A 5. Neuropsych: This patient is capable of making decisions on her own behalf. 6. Skin/Wound Care: Routine pressure relief measures.  7. Fluids/Electrolytes/Nutrition: Monitor I/O. Check lytes in am.  8. T10/T11 fractures: TLSO when at edge of bed and out of bed. Encouraged to wear in bed for support comfort if tolerated.  9. Right pelvic fracture: WBAT 10. T2DM: Hgb A1c- 6.8. will monitor BS ac/hs. Resume metformin once intake improves.  6/29: CBGs poorly controled. Will monitor with increased mobility.  CBG (last 3)  Recent Labs    02/21/20 1637 02/21/20 2054 02/22/20 0556  GLUCAP 201* 137* 152*  7/7: well controlled 11. PAF: Monitor HR tid--continue Xarelto, Cardizem and Metoprolol XL daily.  Vitals:   02/21/20 2100 02/22/20 0558  BP: 135/77 139/82  Pulse: 92 62  Resp: 18 20  Temp: 98.4 F (36.9 C) (!) 97.5 F (36.4 C)  SpO2: 97% 99%  improved after  increase furosemide to home dose 40mg   12. ABLA: Due to fracture/hematoma. Stable at 9.5 13. Leucocytosis: resolved  14. Vitamin D deficiency: was on supplement at home. Will add Vitamin D level to 6/29 labs.  6/29: Level is normal. Will not restart supplement.  15. Vitamin B12 deficiency: was on supplement at home. Will add  Vitamin B level to 6/29 labs.   6/29: Level is normal. Will not restart supplement.  15. Disposition: Patient lives with her son and his fiance.  7/5: Left voicemail for son to inform him that we scheduled Tylenol as per his request.     LOS: 10 days A FACE TO FACE EVALUATION WAS PERFORMED  Charlett Blake 02/22/2020, 7:43 AM

## 2020-02-22 NOTE — NC FL2 (Signed)
[place] LEVEL OF CARE SCREENING TOOL     IDENTIFICATION  Patient Name: Crystal Ashley Birthdate: 1945-09-15 Sex: female Admission Date (Current Location): 02/12/2020  [facility] and [place] Number:  Herbalist and Address:  [facility], [place] [address], [place], [place] [address]      Provider Number:    Attending Physician Name and Address:  Charlett Blake, MD  Relative Name and Phone Number:  Dilan Fullenwider [phone]    Current Level of Care: Hospital Recommended Level of Care: [place] Prior Approval Number:    Date Approved/Denied:   PASRR Number: [id number]  Discharge Plan: SNF    Current Diagnoses: Patient Active Problem List   Diagnosis Date Noted  . Multiple traumatic injuries 02/12/2020  . Closed fracture of multiple pubic rami, right, initial encounter ([place]) 02/07/2020  . Atrial fibrillation, chronic ([place]) 02/07/2020  . Chronic combined systolic (congestive) and diastolic (congestive) heart failure ([place])   . Acute respiratory failure with hypoxia ([place]) 10/18/2018  . Declining mobility 09/23/2018  . B12 deficiency 09/23/2018  . Morbid obesity with BMI of 40.0-44.9, adult ([place]) 09/23/2018  . Anemia 09/23/2018  . History of cardioversion 04/26/2018  . Type 2 diabetes mellitus without complication, without long-term current use of insulin ([place]) 03/15/2018  . Atrial fibrillation with RVR ([place]) 03/15/2018  . Morbid obesity ([place]) 01/04/2017  . Hyperlipidemia associated with type 2 diabetes mellitus ([place]) 01/04/2017  . Vitamin D deficiency 12/15/2016  . Glaucoma   . Hypertension associated with diabetes ([place]) 04/19/2014  . Hypothyroidism 04/19/2014  . History of endometrial cancer 03/30/2014    Orientation RESPIRATION BLADDER Height & Weight     Self, Situation, Place  Normal Incontinent Weight: 198 lb 10.2 oz (90.1 kg) Height:  5' (152.4 cm)  BEHAVIORAL SYMPTOMS/MOOD  NEUROLOGICAL BOWEL NUTRITION STATUS      Incontinent Diet  AMBULATORY STATUS COMMUNICATION OF NEEDS Skin     Verbally Normal                       Personal Care Assistance Level of Assistance              Functional Limitations Info             SPECIAL CARE FACTORS FREQUENCY                       Contractures Contractures Info: Not present    Additional Factors Info                  Current Medications (02/22/2020):  This is the current hospital active medication list Current Facility-Administered Medications  Medication Dose Route Frequency Provider Last Rate Last Admin  . acetaminophen (TYLENOL) tablet 650 mg  650 mg Oral TID WC & HS Kirsteins, Luanna Salk, MD   650 mg at 02/22/20 0826  . alum & mag hydroxide-simeth (MAALOX/MYLANTA) 200-200-20 MG/5ML suspension 30 mL  30 mL Oral Q4H PRN Love, Pamela S, PA-C      . atorvastatin (LIPITOR) tablet 10 mg  10 mg Oral Daily Bary Leriche, PA-C   10 mg at 02/22/20 3500  . bisacodyl (DULCOLAX) suppository 10 mg  10 mg Rectal Daily PRN Love, Pamela S, PA-C      . diltiazem (CARDIZEM CD) 24 hr capsule 240 mg  240 mg Oral Daily Charlett Blake, MD   240 mg at 02/22/20 0826  . diphenhydrAMINE (BENADRYL)  12.5 MG/5ML elixir 12.5-25 mg  12.5-25 mg Oral Q6H PRN Love, Pamela S, PA-C      . dorzolamide-timolol (COSOPT) 22.3-6.8 MG/ML ophthalmic solution 1 drop  1 drop Both Eyes BID Bary Leriche, PA-C   1 drop at 02/22/20 0827  . furosemide (LASIX) tablet 40 mg  40 mg Oral Daily Charlett Blake, MD   40 mg at 02/22/20 0826  . guaiFENesin-dextromethorphan (ROBITUSSIN DM) 100-10 MG/5ML syrup 5-10 mL  5-10 mL Oral Q6H PRN Love, Pamela S, PA-C      . insulin aspart (novoLOG) injection 0-5 Units  0-5 Units Subcutaneous QHS Bary Leriche, PA-C   2 Units at 02/12/20 2133  . insulin aspart (novoLOG) injection 0-9 Units  0-9 Units Subcutaneous TID WC Bary Leriche, PA-C   2 Units at 02/22/20 1247  . latanoprost (XALATAN)  0.005 % ophthalmic solution 1 drop  1 drop Both Eyes QHS Bary Leriche, PA-C   1 drop at 02/21/20 2133  . levothyroxine (SYNTHROID) tablet 75 mcg  75 mcg Oral QAC breakfast Bary Leriche, PA-C   75 mcg at 02/22/20 0557  . lidocaine (LIDODERM) 5 % 2 patch  2 patch Transdermal Q24H Bary Leriche, PA-C   2 patch at 02/22/20 1045  . metoprolol succinate (TOPROL-XL) 24 hr tablet 100 mg  100 mg Oral Daily Bary Leriche, PA-C   100 mg at 02/22/20 1165  . oxyCODONE (Oxy IR/ROXICODONE) immediate release tablet 5-10 mg  5-10 mg Oral Q4H PRN Bary Leriche, PA-C   5 mg at 02/21/20 0820  . polyethylene glycol (MIRALAX / GLYCOLAX) packet 17 g  17 g Oral Daily PRN Love, Pamela S, PA-C      . polyethylene glycol (MIRALAX / GLYCOLAX) packet 17 g  17 g Oral Daily PRN Love, Pamela S, PA-C      . rivaroxaban (XARELTO) tablet 20 mg  20 mg Oral Q supper Raulkar, Clide Deutscher, MD   20 mg at 02/21/20 1751  . senna-docusate (Senokot-S) tablet 2 tablet  2 tablet Oral BID Bary Leriche, PA-C   2 tablet at 02/22/20 7903  . sodium phosphate (FLEET) 7-19 GM/118ML enema 1 enema  1 enema Rectal Once PRN Love, Pamela S, PA-C      . zolpidem (AMBIEN) tablet 5 mg  5 mg Oral QHS PRN Love, Pamela S, PA-C   5 mg at 02/14/20 2115     Discharge Medications: Please see discharge summary for a list of discharge medications.  Relevant Imaging Results:  Relevant Lab Results:   Additional Information    Dyanne Iha

## 2020-02-22 NOTE — Progress Notes (Signed)
Pt refusing the stool softener and tramadol. Pt encouraged to take medication to help with pain control. Pt also refused to let this nurse take lidocaine patch off her back. Pt states " it causes me to much pain to move, so please do not". Pt educated on how the pain medication will help with moving, but pt continues to refuse and states " I will take it in the morning.". Pt resting in bed at this time. Call light in reach.

## 2020-02-22 NOTE — Plan of Care (Signed)
  Problem: RH SAFETY Goal: RH STG ADHERE TO SAFETY PRECAUTIONS W/ASSISTANCE/DEVICE Description: STG Adhere to Safety Precautions With mod I Assistance/Device. Outcome: Progressing   Problem: RH PAIN MANAGEMENT Goal: RH STG PAIN MANAGED AT OR BELOW PT'S PAIN GOAL Description: < 4 Outcome: Progressing

## 2020-02-22 NOTE — Evaluation (Addendum)
Speech Language Pathology Assessment and Plan  Patient Details  Name: Crystal Ashley MRN: [id number] Date of Birth: Mar 04, 1946  SLP Diagnosis:  (n/a for ST)  Rehab Potential:  (n/a for ST) ELOS: n/a for ST    Today's Date: 02/22/2020 SLP Individual Time: 0900-0940 SLP Individual Time Calculation (min): 40 min   Hospital Problem: Principal Problem:   Multiple traumatic injuries  Past Medical History:  Past Medical History:  Diagnosis Date  . Atrial fibrillation ([place])   . Cancer ([place])   . Cataract   . Fracture 2001   left ankle  . Glaucoma   . Hypertension   . Thyroid disease    Past Surgical History:  Past Surgical History:  Procedure Laterality Date  . CARDIOVERSION N/A 04/08/2018   Procedure: CARDIOVERSION;  Surgeon: Adrian Prows, MD;  Location: [facility] ENDOSCOPY;  Service: Cardiovascular;  Laterality: N/A;  . CARDIOVERSION N/A 05/22/2019   Procedure: CARDIOVERSION;  Surgeon: Adrian Prows, MD;  Location: [place];  Service: Cardiovascular;  Laterality: N/A;  . FOOT SURGERY Left   . HYSTEROSCOPY     POLYPECTOMY    Assessment / Plan / Recommendation Clinical Impression   HPI:  Crystal Ashley is a 74 year old female with history of T2DM, CHF, A fib, fall on 01/31/20 who was evaluated in ED and plain films negative. Faall felt to be due to hypotension and she was encouraged to increase fluid intake. She has had problems ambulating with mid back pain, inability to perform ADLs and limited mobility due to pain. She was admitted for work up and found to be hypotensive with AKI. Renal ultrasound showed bilateral renal cortical atrophy without mass or hydronephrosis. Dr. Hollie Salk consulted and felt that patient with acute on chronic renal failure and recommended IVF for gentle hydration and holding Lasix/ARB for now. She was found to have non-displaced superior/inferior pubic rami fractures. Dr. Marlou Sa consulted and recommended WBAT.   Family requested CT back for work up due to pain. CT  lumbar, thoracic and cervical spine done revealing acute T10 and T 11 fracture in setting of diffuse thoracic ankylosis, chronic L1 compression fracture with 45% loss of height. Patient without neurologic symptoms and no B/B issues.  Dr. Marcello Moores consulted for input and MRI lumbar/thoracic spine done showing acute T10 and T11 vertebral body fractures with 25% loss of height at T11 without retropulsion or cord compression. He recommended TLSO to be worn when out of bed X 3 months--no surgery indicated and treatment of osteoporosis. To follow up in 4 weeks with upright films.  AKI improved with hydration nd lasix resumed today. ABLA being monitored. Therapy ongoing and patient limited by pain and weakness. Pt was [facility] to [facility] CIR program 02/12/20 and due to PT/OT reports of changes in cognitive function, SLP was consulted for evaluation on 02/22/20 with results as follows:  Pt presents at baseline level of cognitive-linguistic functioning - son present to endorse baseline deficits in memory and auditory processing/following directions, as well as basic problem solving. He and patient deny any acute changes. Pt did respond to slower speech and repetition to aid in better processing and ability to follow commands. Discussed compensatory strategies to maximize auditory comprehension, as well as recommendation for hearing screening when able, as pt's performance on non-verbal tasks was greater than verbal, which may indicate an influence of hearing in addition to delayed processing. SLP recommended pt have 24/7 supervision at d/c, due to current cognitive deficits, for her greatest safety and full assist with most  ADLs, including medication and finance management, cooking, etc. Pt's son acknowledged recommendations and stated he provides that level of assistance at baseline. Pt's speech was 90-95% intelligible throughout session, and expressive language WFL.   Given that pt is determined to be at baseline level of  cognitive functioning and education complete with pt and son, no further ST services are indicated while inpatient.     Skilled Therapeutic Interventions          Cognitive-linguistic evaluation was administered and results were reviewed with pt (please see above for details regarding results).    SLP Assessment  Patient does not need any further Speech [place] Pathology Services    Recommendations  Patient destination: Home Follow up Recommendations: None;24 hour supervision/assistance Equipment Recommended: None recommended by SLP         SLP Duration   n/a for ST          Pain Pain Assessment Pain Scale: Faces Pain Score: 4  Faces Pain Scale: No hurt Pain Intervention(s): Medication (See eMAR);Rest      SLP Evaluation Cognition Overall Cognitive Status: History of cognitive impairments - at baseline Arousal/Alertness: Awake/alert Orientation Level: Oriented X4 Attention: Sustained Sustained Attention: Appears intact Memory: Impaired Memory Impairment: Storage deficit;Retrieval deficit (suspect hearing loss - recommended evaluation) Awareness: Impaired Awareness Impairment: Anticipatory impairment Problem Solving: Impaired Problem Solving Impairment: Functional basic Safety/Judgment: Appears intact  Comprehension Auditory Comprehension Overall Auditory Comprehension: Impaired at baseline (son reports baseline deficits following complex directions, suspect hearing may also be influencing) Commands: Impaired Complex Commands: 50-74% accurate Conversation: Simple Interfering Components: Processing speed EffectiveTechniques: Increased volume;Slowed speech;Repetition;Visual/Gestural cues Visual Recognition/Discrimination Discrimination: Not tested Reading Comprehension Reading Status: Not tested Expression Expression Primary Mode of Expression: Verbal Verbal Expression Overall Verbal Expression: Appears within functional limits for tasks assessed Naming:  No impairment Written Expression Written Expression: Not tested Oral Motor Oral Motor/Sensory Function Overall Oral Motor/Sensory Function: Within functional limits Motor Speech Overall Motor Speech: Appears within functional limits for tasks assessed Phonation: Normal Intelligibility: Intelligible Motor Speech Errors: Not applicable   Intelligibility: Intelligible   Short Term Goals: No short term goals set  Refer to Care Plan for Long Term Goals  Recommendations for other services: Other: Would recommend hearing screening  Discharge Criteria: Patient will be discharged from SLP if patient refuses treatment 3 consecutive times without medical reason, if treatment goals not met, if there is a change in medical status, if patient makes no progress towards goals or if patient is discharged from hospital.  The above assessment, treatment plan, treatment alternatives and goals were discussed and mutually agreed upon: by patient and son  Arbutus Leas 02/22/2020, 9:48 AM

## 2020-02-22 NOTE — Progress Notes (Signed)
Neurosurgery  Patient seen and examined.  Complaining of low back pain.  For past 6 days, patient has had poorer participation with PT due to pain, as well as feeling her legs buckling.  On exam, lying in bed she appears comfortable.  Hip flexion limited by pain, 2/5.  Her knee extension is 2/5, DF 4/5, PF 5/5.  She complains of some numbness below her knees bilaterally.  I reviewed her MRI.  Overall her alignment is similar to June, and her posterior ligamentous complex appears intact posteriorly at T10/11.  She has moderate-to-severe stenosis at T10-11 secondary to hypertrophied facets, slightly more worse than June when it was moderate.  No cord signal change.  Given her worsening pain and leg weakness, I have recommended T8-T12 posterior percutaneous instrumentation with cement augmentation and bilateral medial facetectomy for decompression at T10-11.  I discussed the surgery in detail with her son who is the medical decision maker for her.  He was in agreement, and asked me to talk to his cousin in [place] Dr. Evelina Dun.  I had a 22 minute discussion regarding my recommendations.  Apparently, the patient's husband had a similar surgery from which he could not recover and ultimately ended up in the patient's death.  I explained that although surgery certainly carries risk with her osteopenia, DM, and DISH and has no guarantee of success, it is the option that would give her the best chance of pain control and ultimately ambulatory ability.  Will plan for surgery Monday morning-- hold Xarelto now.  If family declines surgery, would recommend continued brace therapy.

## 2020-02-22 NOTE — Progress Notes (Signed)
Physical Therapy Session Note  Patient Details  Name: Crystal Ashley MRN: [id number] Date of Birth: April 23, 1946  Today's Date: 02/22/2020 PT Individual Time: 2751-7001 PT Individual Time Calculation (min): 42 min   Short Term Goals: Week 2:  PT Short Term Goal 1 (Week 2): Pt will perform sit<>stand with min assist PT Short Term Goal 2 (Week 2): Pt will perform stand pivot transfers with min assist PT Short Term Goal 3 (Week 2): Pt will ambulate at least 25ft with CGA PT Short Term Goal 4 (Week 2): Pt will ascend/descend 4 steps using B HRs with min assist  Skilled Therapeutic Interventions/Progress Updates:    Pt received supine in bed with her son present - with encouragement pt agreeable to therapy session. Reports need to use bathroom. Supine>sitting L EOB via logroll technique - mod/max cuing for sequencing and education on why using this technique  - max assist for R LE management, rolling, and trunk upright. Once sitting EOB requires max assist to prevent posterior LOB - donned TLSO total assist while providing max assist for trunk support. Continues to have labored breathing once EOB due to exertion and not breathing well during this task - cuing for improvement. +2 assist present for toileting. Sit>stand EOB>stedy with mod assist for lifting. Stedy transfer to [facility]. Standing with mod assist to maintain upright while +2 assist performed LB clothing management - pt unable to stand long enough to remove brief therefore had to stand from [facility] a 2nd time requiring heavy max assist to lift up into stance. Continent of urine. Sit>stand BSC>stedy max assist for lifting and mod assist once in standing; +2 assist peri-care and LB clothing management. Every time pt goes to sit from stedy noticed her knees buckle as she sits down with poor eccentric control. Pt then states "I need to lie down." Stedy transfer to EOB. Pt's son had purchased a dress made of different material that would not be slick  decreasing pt's risk of sliding out of chair. Total assist doffing TLSO. Total assist for changing gowns but the new gown does not fit well. Pt requesting for assistance to supine due to increased pain. Sit>supine max assist for B LE management into bed. Supine doffed new gown and place hospital gown for now as pt declines returning to sitting EOB to place her other gown on due to increase in pain. Left supine in bed with needs in reach and her son present.  Therapy Documentation Precautions:  Precautions Precautions: Back, Fall Required Braces or Orthoses: Spinal Brace Spinal Brace: Thoracolumbosacral orthotic, Applied in sitting position, Other (comment) Spinal Brace Comments: TLSO when OOB Restrictions Weight Bearing Restrictions: No RLE Weight Bearing: Weight bearing as tolerated LLE Weight Bearing: Weight bearing as tolerated  Pain:   At beginning of session reports pain 2/10 - at end after mobility reports pain is "terrible" rating as 5/10 but upon questioning says it is worse than that repeatedly saying "oh my" with the pain catching her breath. Minette Brine, RN notified of pt's increased pain.   Therapy/Group: Individual Therapy  Tawana Scale , PT, DPT, CSRS  02/22/2020, 7:58 AM

## 2020-02-22 NOTE — Progress Notes (Signed)
Occupational Therapy Session Note  Patient Details  Name: Crystal Ashley MRN: [id number] Date of Birth: 08/23/1945  Today's Date: 02/22/2020 OT Individual Time: 8676-1950 OT Individual Time Calculation (min): 15 min    Short Term Goals: Week 2:  OT Short Term Goal 1 (Week 2): patient will complete functional transfers with CG/min A using RW OT Short Term Goal 2 (Week 2): patient will tolerate adl w/c level with min A using ADs OT Short Term Goal 3 (Week 2): patient will complete bed mobility with min A OT Short Term Goal 4 (Week 2): Patient with donn TLSO with mod assist for two consecutive sessions.  Skilled Therapeutic Interventions/Progress Updates:    Pt supine in bed with family member present.  Pt reporting 5/10 pain and stating "I don't want to get up or do anything"  Educated pt of benefits of skilled OT and offered various options of intervention with modifications for pain management.  Pt attempted log roll to left and discontinuing due to back pain.  Pts family member asking questions about pts brace.  Educated her regarding wear schedule and use.  Made nurse aware of pts pain level and location and request for pain medication and medication administered at end of OT session.  Missed 15 minutes of schedule OT treatment time secondary to pain.  Call bell in reach, nursing present at OT departure.  Therapy Documentation Precautions:  Precautions Precautions: Back, Fall Required Braces or Orthoses: Spinal Brace Spinal Brace: Thoracolumbosacral orthotic, Applied in sitting position, Other (comment) Spinal Brace Comments: TLSO when OOB Restrictions Weight Bearing Restrictions: No RLE Weight Bearing: Weight bearing as tolerated LLE Weight Bearing: Weight bearing as tolerated   Therapy/Group: Individual Therapy  Ezekiel Slocumb 02/22/2020, 5:36 PM

## 2020-02-23 ENCOUNTER — Inpatient Hospital Stay (HOSPITAL_COMMUNITY): Payer: Medicare Other | Admitting: Occupational Therapy

## 2020-02-23 ENCOUNTER — Encounter (HOSPITAL_COMMUNITY): Payer: Medicare Other | Admitting: Psychology

## 2020-02-23 ENCOUNTER — Inpatient Hospital Stay (HOSPITAL_COMMUNITY): Payer: Medicare Other | Admitting: Physical Therapy

## 2020-02-23 LAB — URINE CULTURE

## 2020-02-23 LAB — GLUCOSE, CAPILLARY
Glucose-Capillary: 140 mg/dL — ABNORMAL HIGH (ref 70–99)
Glucose-Capillary: 164 mg/dL — ABNORMAL HIGH (ref 70–99)
Glucose-Capillary: 181 mg/dL — ABNORMAL HIGH (ref 70–99)
Glucose-Capillary: 144 mg/dL — ABNORMAL HIGH (ref 70–99)

## 2020-02-23 MED ORDER — HYDROCODONE-ACETAMINOPHEN 5-325 MG PO TABS: 1.0000 | ORAL_TABLET | ORAL | Status: DC | PRN

## 2020-02-23 MED ORDER — TRAMADOL HCL 50 MG PO TABS
50.0000 mg | ORAL_TABLET | Freq: Four times a day (QID) | ORAL | Status: DC
  Administered 2020-02-23 – 2020-02-25 (×8): 50 mg via ORAL
  Filled 2020-02-23 (×11): qty 1

## 2020-02-23 MED ORDER — ONDANSETRON HCL 4 MG PO TABS: 4.0000 mg | ORAL_TABLET | Freq: Four times a day (QID) | ORAL | Status: DC | PRN

## 2020-02-23 MED ORDER — SENNOSIDES-DOCUSATE SODIUM 8.6-50 MG PO TABS
2.0000 | ORAL_TABLET | Freq: Every day | ORAL | Status: DC
  Administered 2020-02-24: 2 via ORAL
  Filled 2020-02-23 (×2): qty 2

## 2020-02-23 MED ORDER — HEPARIN (PORCINE) 25000 UT/250ML-% IV SOLN
1100.0000 [IU]/h | INTRAVENOUS | Status: DC
  Administered 2020-02-23: 1000 [IU]/h via INTRAVENOUS
  Filled 2020-02-23: qty 250

## 2020-02-23 NOTE — Progress Notes (Signed)
Neurosurgery  Patient seen and examined.  Some abulia noted.  Complains of back pain and leg weakness.  On exam, 4-/5 DF and 5/5 PF on left, 3/5 DF and 4/5 PF on right.  KE limited by pain but 2/5.  Currently, no sensory numbness in legs.  Unfortunately, there are no effective nonsurgical options that would likely help.  Understandably, there was hesitation with proceeding with surgery on the family's part due to the patient's husband dying soon after cervical and lumbar surgery 8 months ago here at the hospital, as well as patient's comorbidities. The family now wishes to proceed with surgery.  Planning for surgery Monday morning, cont heparin gtt for now.  Hold heparin at midnight Sunday.

## 2020-02-23 NOTE — Progress Notes (Signed)
Pt refuses to let staff sit the head of her bed up due to the pain. PRN medication was offered to the pt for pain, but pt declined.

## 2020-02-23 NOTE — Patient Care Conference (Signed)
Inpatient RehabilitationTeam Conference and Plan of Care Update Date: 02/23/2020   Time: 11:00 AM    Patient Name: Crystal Ashley      Medical Record Number: [id number]  Date of Birth: 11-07-1945 Sex: Female         Room/Bed: 4W03C/4W03C-01 Payor Info: Payor: MEDICARE / Plan: MEDICARE PART A AND B / Product Type: *No Product type* /    Admit Date/Time:  02/12/2020  5:50 PM  Primary Diagnosis:  Multiple traumatic injuries  Hospital Problems: Principal Problem:   Multiple traumatic injuries    Expected Discharge Date: Expected Discharge Date:  (SNF)  Team Members Present: Physician leading conference: Dr. Alysia Penna Care Coodinator Present: Dorien Chihuahua, RN, BSN, CRRN;[name] [place], [place] Nurse Present: Debroah Loop, RN PT Present: Barrie Folk, PT OT Present: Clyda Greener, OT SLP Present: Jettie Booze, CF-SLP PPS Coordinator present : Gunnar Fusi, SLP     Current Status/Progress Goal Weekly Team Focus  Bowel/Bladder   patient is mostly continent but has episodes of incontinence  Patient will maintain bladder and bowel continence during inpatient rehab stay  Assess needs Q shift   Swallow/Nutrition/ Hydration             ADL's   min assist for UB bathing with mod to max for LB,  total assist for donning TLSO with min assist for pullover gown.  max assist for donning incontinence brief as well,  Mod assist for functional transfers with use of the RW.  Decreased motivation with decreased initiation and increased pain  min assist to supervision  selfcare retraining, balance retraining, DME education, AE education, pt/family education, transfer training   Mobility   mod assist bed mobility, min up to mod/max assist sit<>stand and stand pivot transfers using RW - pt has had recent decline in functional mobility level the past 3 days requiring up to mod/max assist and has not ambulated in this time  supervision overall  bed mobility, functional transfers, gait  training, pt/family education, activity tolerance, discharge planning, B LE strengthening   Communication             Safety/Cognition/ Behavioral Observations            Pain   Patient C/O back pain. PRN Oxy, tramadol D/Cd  Patient will report pain level less than 4 and tolerate increased activity  Assess pain Q shift   Skin               Team Discussion:  Discharge Planning/Teaching Needs:  Goal to discharge to SNF 03/01/2020      Current Update: on target  Current Barriers to Discharge:  Lack of/limited family support Cognitive changes: decreased participation, initiation, new onset incontinence of B+B and consistent report of back pain that limits activity, new ataxic gait and overall functional decline since last week   Possible Resolutions to Barriers: Cognition possibly affected by medication; MD to adjust Discuss surgery on back/consult for treatment per MD  Patient on target to meet rehab goals: no, goals adjusted and family discussion with MD regarding surgery to address back pain.  *See Care Plan and progress notes for long and short-term goals.   Revisions to Treatment Plan:  Downgraded goals     Medical Summary                 I attest that I was present, lead the team conference, and concur with the assessment and plan of the team.   Dorien Chihuahua B 02/23/2020, 11:00 AM

## 2020-02-23 NOTE — Consult Note (Signed)
Neuropsychological Consultation   Patient:   Crystal Ashley   DOB:   09/01/1945  MR Number:  [id number]  Location:  [place] [facility] [id number] [place] [address] Dept: [place]: [phone]           Date of Service:   02/23/2020  Start Time:   8 AM End Time:   9 AM  Provider/Observer:  Ilean Skill, Psy.D.       Clinical Neuropsychologist       Billing Code/Service: [phone]  Chief Complaint:    Crystal Ashley is a 74 year old female with history of diabetes, congestive heart failure, A. fib.  The patient had a fall on 01/31/2020 and was initially evaluated in the ED with negative film.  Fall felt to be due to hypotension and patient was encouraged to increase fluid intake.  The patient continued have problems with ambulation and mid back pain and inability to perform ADLs with limited mobility due to pain.  Patient was admitted for work-up and found to be hypotensive with AKI.  Renal ultrasound showed bilateral renal cortical atrophy without mass.  Patient felt to be acute on chronic renal failure and recommended IVF for gentle hydration.  Patient also found to have nondisplaced superior/inferior pubic Ramey fractures.  Family requested CT back for work-up for pain.  CT lumbar, thoracic and cervical spine done revealing acute T10 and T11 fracture in setting of diffuse thoracic ankylosis, chronic L1 compression fracture with 45% loss of height.  Patient without neurological symptoms and no B/B issues.  Dr. Marcello Moores has been consulted and MRI lumbar/thoracic spine showing T10 and T11 vertebral body fractures with 25% loss of height at T11 without retropulsion or cord compression.  Patient initially recommended for conservative care.  However, there have been some more concerns about potential development of neurological symptoms due to increasing weakness in lower extremities.  Family with concerns  about surgical options versus conservative care.  Patient with pain that is hard to differentiate between residual effects of her pelvic fractures versus pain due to spinal fracture versus neurological involvement with question of surgery.  The patient has some prior experience with her husband having cervical and thoracic surgery at [facility] and later after his surgery passed away.  While the family does not attribute the patient's husband passing away his result of some air that was made but the patient just associates this type of surgery with this particular hospital in a very negative and fearful way.  Reason for Service:  HPI:  Crystal Ashley is a 74 year old female with history of T2DM, CHF, A fib, fall on 01/31/20 who was evaluated in ED and plain films negative. Faall felt to be due to hypotension and she was encouraged to increase fluid intake. She has had problems ambulating with mid back pain, inability to perform ADLs and limited mobility due to pain. She was admitted for work up and found to be hypotensive with AKI. Renal ultrasound showed bilateral renal cortical atrophy without mass or hydronephrosis. Dr. Hollie Salk consulted and felt that patient with acute on chronic renal failure and recommended IVF for gentle hydration and holding Lasix/ARB for now. She was found to have non-displaced superior/inferior pubic rami fractures. Dr. Marlou Sa consulted and recommended WBAT.    Family requested CT back for work up due to pain. CT lumbar, thoracic and cervical spine done revealing acute T10 and T 11 fracture in setting of  diffuse thoracic ankylosis, chronic L1 compression fracture with 45% loss of height. Patient without neurologic symptoms and no B/B issues.  Dr. Marcello Moores consulted for input and MRI lumbar/thoracic spine done showing acute T10 and T11 vertebral body fractures with 25% loss of height at T11 without retropulsion or cord compression. He recommended TLSO to be worn when out of bed X 3  months--no surgery indicated and treatment of osteoporosis. To follow up in 4 weeks with upright films.  AKI improved with hydration nd lasix resumed today. ABLA being monitored. Therapy ongoing and patient limited by pain and weakness. CIR recommended due to functional decline.   Current Status:  The patient was lethargic with very flat affect upon entering the room and asking questions.  She was laid back flat on the bed with very little movement during the time I was in the room.  I had long conversations with the patient's son as well as a family member via telephone who is a physician.  They voiced concerns about various options including concerns around various surgical options versus conservative care.  There has been some concern regarding the patient and her self associated with concerns around depression as the patient always has a rather flat affect.  Talking to family members, the patient is described as this being a typical pattern and they did not feel there was any acute depressive features going on and the patient has had little activity outside of just very basic activities within the home for quite some time.  The patient has had a significant loss in her life with her husband dying in December 2020.  Behavioral Observation: Crystal Ashley  presents as a 74 y.o.-year-old Right Asian Female who appeared her stated age. her dress was Appropriate and she was Well Groomed and her manners were Appropriate to the situation.  her participation was indicative of Appropriate and Inattentive behaviors.  There were  physical disabilities noted.  she displayed an inappropriate level of cooperation and motivation.     Interactions:    Minimal Inattentive  Attention:   abnormal and attention span appeared shorter than expected for age  Memory:   not examined; recent and remote memory intact  Visuo-spatial:  not examined  Speech (Volume):  low  Speech:   normal; normal  Thought  Process:  Circumstantial  Though Content:  WNL; not suicidal and not homicidal  Orientation:   person, place and time/date  Judgment:   Fair  Planning:   Poor  Affect:    Flat and Lethargic  Mood:    Dysphoric  Insight:   Fair  Intelligence:   high  Medical History:   Past Medical History:  Diagnosis Date  . Atrial fibrillation ([place])   . Cancer ([place])   . Cataract   . Fracture 2001   left ankle  . Glaucoma   . Hypertension   . Thyroid disease    Psychiatric History:  No prior psychiatric history.  Family Med/Psych History:  Family History  Problem Relation Age of Onset  . Diabetes Son   . Healthy Mother   . Healthy Father    Impression/DX:  Khilynn Borntreger is a 74 year old female with history of diabetes, congestive heart failure, A. fib.  The patient had a fall on 01/31/2020 and was initially evaluated in the ED with negative film.  Fall felt to be due to hypotension and patient was encouraged to increase fluid intake.  The patient continued have problems with ambulation and mid back pain and  inability to perform ADLs with limited mobility due to pain.  Patient was admitted for work-up and found to be hypotensive with AKI.  Renal ultrasound showed bilateral renal cortical atrophy without mass.  Patient felt to be acute on chronic renal failure and recommended IVF for gentle hydration.  Patient also found to have nondisplaced superior/inferior pubic Ramey fractures.  Family requested CT back for work-up for pain.  CT lumbar, thoracic and cervical spine done revealing acute T10 and T11 fracture in setting of diffuse thoracic ankylosis, chronic L1 compression fracture with 45% loss of height.  Patient without neurological symptoms and no B/B issues.  Dr. Marcello Moores has been consulted and MRI lumbar/thoracic spine showing T10 and T11 vertebral body fractures with 25% loss of height at T11 without retropulsion or cord compression.  Patient initially recommended for conservative care.   However, there have been some more concerns about potential development of neurological symptoms due to increasing weakness in lower extremities.  Family with concerns about surgical options versus conservative care.  Patient with pain that is hard to differentiate between residual effects of her pelvic fractures versus pain due to spinal fracture versus neurological involvement with question of surgery.  The patient has some prior experience with her husband having cervical and thoracic surgery at [facility] and later after his surgery passed away.  While the family does not attribute the patient's husband passing away his result of some air that was made but the patient just associates this type of surgery with this particular hospital in a very negative and fearful way.  The patient was lethargic with very flat affect upon entering the room and asking questions.  She was laid back flat on the bed with very little movement during the time I was in the room.  I had long conversations with the patient's son as well as a family member via telephone who is a physician.  They voiced concerns about various options including concerns around various surgical options versus conservative care.  There has been some concern regarding the patient and her self associated with concerns around depression as the patient always has a rather flat affect.  Talking to family members, the patient is described as this being a typical pattern and they did not feel there was any acute depressive features going on and the patient has had little activity outside of just very basic activities within the home for quite some time.  The patient has had a significant loss in her life with her husband dying in December 2020.   Disposition/Plan:  I will follow for the patient next week if she is still on the unit.  There is a question about whether the family is going to try to have her transferred to [facility] if surgery is performed  or not.  The patient does have fears associated with [facility] and cervical and lumbar surgery issues.  I do not think the patient is dealing with an acute depression and this is likely a longstanding personality feature for the patient and is well ingrained and existed to a significant degree even prior to her husband's death 8 months ago.  I discussed the family's concerns with the patient's attending and PA who are well aware of the issues that were raised with me by the family members.  A plan has been made as far as getting another consult from IR regarding potential interventional options.  Diagnosis:    Fracture - Plan: IR Radiologist Eval & Mgmt, IR  Radiologist Eval & Mgmt         Electronically Signed   _______________________ Ilean Skill, Psy.D.

## 2020-02-23 NOTE — Progress Notes (Signed)
Physical Therapy Session Note  Patient Details  Name: Crystal Ashley MRN: [id number] Date of Birth: 08-24-1945  Today's Date: 02/23/2020 PT Individual Time: 0108-0118 PT Individual Time Calculation (min): 10 min   and  Today's Date: 02/23/2020 PT Missed Time: 50 Minutes Missed Time Reason: Pain  Short Term Goals: Week 2:  PT Short Term Goal 1 (Week 2): Pt will perform sit<>stand with min assist PT Short Term Goal 2 (Week 2): Pt will perform stand pivot transfers with min assist PT Short Term Goal 3 (Week 2): Pt will ambulate at least 50ft with CGA PT Short Term Goal 4 (Week 2): Pt will ascend/descend 4 steps using B HRs with min assist  Skilled Therapeutic Interventions/Progress Updates:   Pt received supine in bed with Pam, PA present to assess patient. Therapist assisted with rolling R min assist for trunk movement and LE management. Pt stating "oh my" while lying there stating her pain is "terrible." Pt noted to not have eaten lunch therefore Pam and therapist educated pt on importance of ensuring proper nutritional intake to decrease muscle atrophy. Pam, PA educated pt on taking pain medication and therapist educated on importance of participating in skilled physical therapy. Pt reports she is nauseous and cannot participate in therapy at this time - therapist provided a cool wash clothe. Minette Brine, RN present for medication administration. Pt declines assistance to return back to supine as her pain is too great to try and roll at this time. Pt left R sidelying in bed with RN and pt's son present to assume care of patient. Missed 50 minutes of skilled physical therapy.    Therapy Documentation Precautions:  Precautions Precautions: Back, Fall Required Braces or Orthoses: Spinal Brace Spinal Brace: Thoracolumbosacral orthotic, Applied in sitting position, Other (comment) Spinal Brace Comments: TLSO when OOB Restrictions Weight Bearing Restrictions: No RLE Weight Bearing: Weight bearing  as tolerated LLE Weight Bearing: Weight bearing as tolerated   Therapy/Group: Individual Therapy  Tawana Scale , PT, DPT, CSRS  02/23/2020, 12:30 PM

## 2020-02-23 NOTE — Progress Notes (Signed)
Patient ID: Crystal Ashley, female   DOB: Aug 17, 1946, 74 y.o.   MRN: [id number]   Sw informed [facility] AD: Irine Seal [phone] of patient's upcoming procedure. Son will still like to move forward with finding placement for mom post surgery. AD will reach out to son to schedule tour of facility.

## 2020-02-23 NOTE — Progress Notes (Addendum)
[facility] PHYSICAL MEDICINE & REHABILITATION PROGRESS NOTE   Subjective/Complaints:   Pt with pain but refusing prn pain meds at times , but took some this am. No leg pain but does have leg weakness. No numbness or tingling in the legs no loss of bowel or bladder function. Discussed pain medications with patient as well as nursing.  The patient has pain in the lumbar as well as the thoracic area.  Reviewed CT of the lumbar as well as thoracic spine. Discussed with neurosurgery. No new findings in terms of the thoracic fracture. There is some foraminal stenosis in the lumbar spine at L4-5 but no corresponding radicular pain. Patient indicates that she does not want surgery. Discussed this with the patient's son who states that he will talk to her some more about this. Patient's son requested evaluation at [facility]. Patient also seen by neuropsychology, patient expressed to Dr. Sima Matas that she is not entirely against surgery but does not wish to have it at [facility] health. Dr. Sima Matas  also spoke to patient's cousin who is interested in exploring possibility of vertebroplasty. ROS: Denies CP, SOB. N/V/D  Objective:   CT HEAD WO CONTRAST  Result Date: 02/21/2020 CLINICAL DATA:  Change in mental status EXAM: CT HEAD WITHOUT CONTRAST TECHNIQUE: Contiguous axial images were obtained from the base of the skull through the vertex without intravenous contrast. COMPARISON:  01/31/2020 FINDINGS: Brain: There is no acute intracranial hemorrhage, mass effect, or edema. No new loss of gray-white differentiation. There is no extra-axial fluid collection. Prominence of the ventricles and sulci reflects stable parenchymal volume loss. Patchy hypoattenuation in the supratentorial white matter is nonspecific but probably reflects stable chronic microvascular ischemic changes Vascular: There is atherosclerotic calcification at the skull base. Skull: Calvarium is unremarkable.  Sinuses/Orbits: Increased retained secretions within the left sphenoid sinus. No acute orbital abnormality. Other: None. IMPRESSION: No acute intracranial abnormality. Stable chronic findings detailed above. Electronically Signed   By: Macy Mis M.D.   On: 02/21/2020 16:31   CT THORACIC SPINE WO CONTRAST  Result Date: 02/22/2020 CLINICAL DATA:  Spinal fracture EXAM: CT THORACIC AND LUMBAR SPINE WITHOUT CONTRAST TECHNIQUE: Multidetector CT imaging of the thoracic and lumbar spine was performed without contrast. Multiplanar CT image reconstructions were also generated. COMPARISON:  Thoracic spine MRI 02/21/2020 Thoracolumbar spine CT 02/06/2020 FINDINGS: CT THORACIC SPINE FINDINGS Alignment: Normal. Vertebrae: Fracture of T10 with anterior distraction that now measures 11 mm, previously 8 mm. Mild progression of height loss at T11. There are flowing anterior syndesmophytes along the length of thoracic spine. Paraspinal and other soft tissues: Bilateral pleural effusions. Cardiomegaly. Mild pulmonary edema. Disc levels: There is moderate spinal canal stenosis at the T10-11 level. There is also severe bilateral foraminal stenosis at this level. CT LUMBAR SPINE FINDINGS Segmentation: 5 lumbar type vertebrae. Alignment: Normal. Vertebrae: Chronic height loss at L1 of approximately 10% anteriorly. No retropulsion. Paraspinal and other soft tissues: Calcific aortic atherosclerosis. Disc levels: T12-L1: Mild spinal canal stenosis due to disc bulge and facet hypertrophy. L1-2: No spinal canal stenosis or neural impingement. L2-3: No spinal canal stenosis or neural impingement. L3-4: Mild spinal canal stenosis due to facet hypertrophy and disc bulge. L4-5: Moderate spinal canal stenosis due to disc bulge and facet hypertrophy. Mild right and severe left foraminal stenosis. L5-S1: Disc space narrowing mild spinal canal stenosis. Mild right and moderate left foraminal stenosis. IMPRESSION: 1. T10 fracture with anterior  distraction that now measures 11 mm, previously 8 mm. 2. Mild  progression of height loss of the T11 compression fracture. 3. 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 4. Severe left foraminal stenosis at L4-5 due to disc bulge and facet hypertrophy. 5. Chronic height loss at L1 of approximately 10% anteriorly. 6. Bilateral pleural effusions and mild pulmonary edema. 7. Flowing anterior syndesmophytes along the length of the thoracic spine). 8. Aortic Atherosclerosis (ICD10-I70.0). Electronically Signed   By: Ulyses Jarred M.D.   On: 02/22/2020 21:03   CT LUMBAR SPINE WO CONTRAST  Result Date: 02/22/2020 CLINICAL DATA:  Spinal fracture EXAM: CT THORACIC AND LUMBAR SPINE WITHOUT CONTRAST TECHNIQUE: Multidetector CT imaging of the thoracic and lumbar spine was performed without contrast. Multiplanar CT image reconstructions were also generated. COMPARISON:  Thoracic spine MRI 02/21/2020 Thoracolumbar spine CT 02/06/2020 FINDINGS: CT THORACIC SPINE FINDINGS Alignment: Normal. Vertebrae: Fracture of T10 with anterior distraction that now measures 11 mm, previously 8 mm. Mild progression of height loss at T11. There are flowing anterior syndesmophytes along the length of thoracic spine. Paraspinal and other soft tissues: Bilateral pleural effusions. Cardiomegaly. Mild pulmonary edema. Disc levels: There is moderate spinal canal stenosis at the T10-11 level. There is also severe bilateral foraminal stenosis at this level. CT LUMBAR SPINE FINDINGS Segmentation: 5 lumbar type vertebrae. Alignment: Normal. Vertebrae: Chronic height loss at L1 of approximately 10% anteriorly. No retropulsion. Paraspinal and other soft tissues: Calcific aortic atherosclerosis. Disc levels: T12-L1: Mild spinal canal stenosis due to disc bulge and facet hypertrophy. L1-2: No spinal canal stenosis or neural impingement. L2-3: No spinal canal stenosis or neural impingement. L3-4: Mild spinal canal stenosis  due to facet hypertrophy and disc bulge. L4-5: Moderate spinal canal stenosis due to disc bulge and facet hypertrophy. Mild right and severe left foraminal stenosis. L5-S1: Disc space narrowing mild spinal canal stenosis. Mild right and moderate left foraminal stenosis. IMPRESSION: 1. T10 fracture with anterior distraction that now measures 11 mm, previously 8 mm. 2. Mild progression of height loss of the T11 compression fracture. 3. 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 4. Severe left foraminal stenosis at L4-5 due to disc bulge and facet hypertrophy. 5. Chronic height loss at L1 of approximately 10% anteriorly. 6. Bilateral pleural effusions and mild pulmonary edema. 7. Flowing anterior syndesmophytes along the length of the thoracic spine). 8. Aortic Atherosclerosis (ICD10-I70.0). Electronically Signed   By: Ulyses Jarred M.D.   On: 02/22/2020 21:03   MR THORACIC SPINE W WO CONTRAST  Result Date: 02/21/2020 CLINICAL DATA:  High-grade with marrow edema remains with stable height loss tubular stool marrow edema EXAM: MRI THORACIC WITHOUT AND WITH CONTRAST TECHNIQUE: Multiplanar and multiecho pulse sequences of the thoracic spine were obtained without and with intravenous contrast. CONTRAST:  41mL GADAVIST GADOBUTROL 1 MMOL/ML IV SOLN COMPARISON:  02/08/2020 FINDINGS: Motion artifact is present Alignment:  Stable. Vertebrae: Fracture involving the inferior endplate of G62 is again identified. Additional fracture involving the superior endplate of I94 is again seen. There is persistent but decreased marrow edema. Height loss is slightly increased at T11 but remains less than 50%. Corresponding enhancement is present with some extension into the posterior elements. Chronic L1 compression fracture. Cord: No definite abnormal signal. There is dorsal epidural enhancement at the T9-T11 levels likely related to fractures. Paraspinal and other soft tissues: Paraspinal soft  tissue enhancement at the fracture levels. Disc levels: There is persistent canal stenosis at T10-T11 where there is facet arthropathy with ligamentum flavum thickening. This appears increased. There  is also increased foraminal stenosis at this level with enhancement noted within the foramina. IMPRESSION: Fractures of T10 and T11 as noted previously. Slightly increased height loss at T11. 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 There is epidural and foraminal enhancement primarily at the fracture levels that is presumably inflammatory and related to the fractures in the absence of clinical suspicion for other processes such as infection. Electronically Signed   By: Macy Mis M.D.   On: 02/21/2020 20:58   Recent Labs    02/22/20 0811  WBC 8.9  HGB 10.6*  HCT 35.8*  PLT 303   No results for input(s): NA, K, CL, CO2, GLUCOSE, BUN, CREATININE, CALCIUM in the last 72 hours.  Intake/Output Summary (Last 24 hours) at 02/23/2020 0728 Last data filed at 02/22/2020 2344 Gross per 24 hour  Intake --  Output 450 ml  Net -450 ml     Physical Exam: Vital Signs Blood pressure (!) 142/79, pulse (!) 48, temperature 98 F (36.7 C), temperature source Oral, resp. rate 20, height 5' (1.524 m), weight 91.3 kg, SpO2 98 %.   General: No acute distress Mood and affect are appropriate Heart: Regular rate and rhythm no rubs murmurs or extra sounds Lungs: Clear to auscultation, breathing unlabored, no rales or wheezes Abdomen: Positive bowel sounds, soft nontender to palpation, nondistended Extremities: No clubbing, cyanosis, or edema Skin: No evidence of breakdown, no evidence of rash   Neuro:  Motor 4/5 bilateral delt , bi, tri grip, 2- HF, KE, ADF (? Full effort)  There is no evidence of spasticity in bilateral lower extremities Sensation intact to LT and proprioception bilateral LEs  Musculoskeletal: Full range of motion in all 4 extremities. No joint swelling Psych: Pt's affect is  appropriate. Pt is cooperative although with poor initiation   Assessment/Plan: 1. Functional deficits secondary to multiple fractures following fall, which require 3+ hours per day of interdisciplinary therapy in a comprehensive inpatient rehab setting.  Needing 15/7 schedule- very sedentary PTA  Physiatrist is providing close team supervision and 24 hour management of active medical problems listed below.  Physiatrist and rehab team continue to assess barriers to discharge/monitor patient progress toward functional and medical goals  Care Tool:  Bathing    Body parts bathed by patient: Right arm, Left arm, Chest, Abdomen, Right upper leg, Left upper leg, Face   Body parts bathed by helper: Buttocks, Front perineal area Body parts n/a: Right lower leg, Left lower leg   Bathing assist Assist Level: Moderate Assistance - Patient 50 - 74%     Upper Body Dressing/Undressing Upper body dressing Upper body dressing/undressing activity did not occur (including orthotics): N/A What is the patient wearing?: Dress    Upper body assist Assist Level: Minimal Assistance - Patient > 75%    Lower Body Dressing/Undressing Lower body dressing      What is the patient wearing?: Incontinence brief     Lower body assist Assist for lower body dressing: Maximal Assistance - Patient 25 - 49%     Toileting Toileting    Toileting assist Assist for toileting: Maximal Assistance - Patient 25 - 49%     Transfers Chair/bed transfer  Transfers assist     Chair/bed transfer assist level: Dependent - mechanical lift Chair/bed transfer assistive device: Mechanical lift (stedy)   Locomotion Ambulation   Ambulation assist      Assist level: Minimal Assistance - Patient > 75% Assistive device: Parallel bars Max distance: 8   Walk 10 feet activity  Assist  Walk 10 feet activity did not occur: Safety/medical concerns  Assist level: Minimal Assistance - Patient > 75% Assistive  device: Walker-rolling   Walk 50 feet activity   Assist Walk 50 feet with 2 turns activity did not occur: Safety/medical concerns  Assist level: Minimal Assistance - Patient > 75% Assistive device: Walker-rolling    Walk 150 feet activity   Assist Walk 150 feet activity did not occur: Safety/medical concerns         Walk 10 feet on uneven surface  activity   Assist Walk 10 feet on uneven surfaces activity did not occur: Safety/medical concerns         Wheelchair     Assist Will patient use wheelchair at discharge?: No (if used w/c would be for dependent community transport)             Wheelchair 50 feet with 2 turns activity    Assist            Wheelchair 150 feet activity     Assist          Blood pressure (!) 142/79, pulse (!) 48, temperature 98 F (36.7 C), temperature source Oral, resp. rate 20, height 5' (1.524 m), weight 91.3 kg, SpO2 98 %.  Medical Problem List and Plan: 1.  Impaired mobility and ADLs secondary to multiple traumatic injuries following fall, possibly secondary to starting Cardizem.              CIR PT, OT,    Discussed with NS , Dr Marcello Moores, who will see the pt, pt with complicating factors of DISH/AS, as well as osteoporosis, eval for potential cemented fusion , vertebroplasty/kyphoplasty likely not an option since T10 not a simple compression fx, concerns are adequate T10 that bone  cement would leak anteriorly, T11 may be amenable We will get IR evaluation             - 2.  Antithrombotics: -DVT/anticoagulation:  Pharmaceutical: Xarelto on hold pending potential procedure, IV heparin bridge per pharmacy protocol, if no invasive procedure is planned can be switched back to Xarelto 20 mg/day Hgb stable              -antiplatelet therapy:N/a 3. Pain Management: Patient feels nauseous with opioids mainly with the oxycodone, will increase dose of tramadol. Scheduled Tylenol and added lidocaine patch and kpad for  lumbar paraspinals where pain is worst.  4. Mood: Team to provide ego support. LCSW to follow for evaluation and support.              -antipsychotic agents: N/A 5. Neuropsych: This patient is capable of making decisions on her own behalf. 6. Skin/Wound Care: Routine pressure relief measures.  7. Fluids/Electrolytes/Nutrition: Monitor I/O. Check lytes in am.  8. T10/T11 fractures: TLSO when at edge of bed and out of bed. Encouraged to wear in bed for support comfort if tolerated.  9. Right pelvic fracture: WBAT 10. T2DM: Hgb A1c- 6.8. will monitor BS ac/hs. Resume metformin once intake improves.  6/29: CBGs poorly controled. Will monitor with increased mobility.  CBG (last 3)  Recent Labs    02/22/20 1731 02/22/20 2118 02/23/20 0608  GLUCAP 165* 160* 140*  7/9: well controlled 11. PAF: Monitor HR tid--continue Xarelto, Cardizem and Metoprolol XL daily.  Vitals:   02/22/20 1954 02/23/20 0408  BP: 128/69 (!) 142/79  Pulse: 61 (!) 48  Resp: 18 20  Temp: 98.9 F (37.2 C) 98 F (36.7 C)  SpO2: 98% 98%  improved after  increase furosemide to home dose 40mg    12. ABLA: Due to fracture/hematoma. Stable at 9.5 13. Leucocytosis: resolved  14. Vitamin D deficiency: was on supplement at home. Will add Vitamin D level to 6/29 labs.  6/29: Level is normal. Will not restart supplement.  15. Vitamin B12 deficiency: was on supplement at home. Will add Vitamin B level to 6/29 labs.   6/29: Level is normal. Will not restart supplement.  15. Disposition: Patient lives with her son and his fiance. We will check with social work if transferred to [facility] directly from rehab is possible or whether transfer to acute care at [facility] would be needed first.  SNF is ultimate plan, FL 2 in place Patient is DNR LOS: 11 days A FACE TO [place] Quinne Pires 02/23/2020, 7:28 AM

## 2020-02-23 NOTE — Plan of Care (Signed)
  Problem: RH SAFETY Goal: RH STG ADHERE TO SAFETY PRECAUTIONS W/ASSISTANCE/DEVICE Description: STG Adhere to Safety Precautions With mod I Assistance/Device. Outcome: Progressing   Problem: RH PAIN MANAGEMENT Goal: RH STG PAIN MANAGED AT OR BELOW PT'S PAIN GOAL Description: < 4 Outcome: Progressing

## 2020-02-23 NOTE — Progress Notes (Signed)
ANTICOAGULATION CONSULT NOTE - Initial Consult  Pharmacy Consult for rivarox >> heparin Indication: atrial fibrillation  No Known Allergies  Patient Measurements: Height: 5' (152.4 cm) Weight: 91.3 kg (201 lb 4.5 oz) IBW/kg (Calculated) : 45.5 Heparin Dosing Weight: 67 kg  Vital Signs: Temp: 98 F (36.7 C) (07/09 0408) Temp Source: Oral (07/09 0408) BP: 142/79 (07/09 0408) Pulse Rate: 48 (07/09 0408)  Labs: Recent Labs    02/22/20 0811  HGB 10.6*  HCT 35.8*  PLT 303    Estimated Creatinine Clearance: 53.5 mL/min (by C-G formula based on SCr of 0.93 mg/dL).   Medical History: Past Medical History:  Diagnosis Date  . Atrial fibrillation ([place])   . Cancer ([place])   . Cataract   . Fracture 2001   left ankle  . Glaucoma   . Hypertension   . Thyroid disease     Assessment: 74 yo W on rivaroxban PTA for afib (CHADS2VASc = 4) now transitioning to heparin gtt for planned Neurosurgery on 7/12. Last dose of rivaroxaban given 7/8 at 17:50. Will start heparin around next rivaroxaban due time and follow aPTT until correlates with heparin level.  Goal of Therapy:  Heparin level 0.3-0.7 units/ml Monitor platelets by anticoagulation protocol: Yes   Plan:  Start heparin at 1000 units/hr at 16:00, no bolus F/u 8hr aPTT F/u aPTT until correlates with heparin level  Monitor daily aPTT, HL, CBC/plt Monitor for signs/symptoms of bleeding    Benetta Spar, PharmD, BCPS, BCCP Clinical Pharmacist  Please check AMION for all [place] phone numbers After 10:00 PM, call [place]

## 2020-02-23 NOTE — Progress Notes (Signed)
Chaplain responded to Spiritual Consult re. Notary of AD.  Chaplain spoke with son who had requested the Chaplain come about the notary.  Chaplain explained to the son we would need the assistance of two volunteers who are not on again until Monday.  Son understood that situation and advised a hospitalist friend of the family has offered to grab another physician to act as the witnesses if we can get this done on Saturday.  Chaplain is willing to assist, but if unable to notarize will refer to day Chaplain tomorrow to see if there is another Notary in the house.  Time is of the essence as patient's surgery is on Monday.   Son spoke of how his father had died here in Aug 26, 2023 from a similar operation and how scared he and his mother are about her having this surgery.  Chaplain acknowledged how that fear is certainly warranted and understandable.  Chaplain moved to bedside and spoke with patient briefly.  Chaplain stands ready to assist in any way possible.  De Burrs Chaplain Resident

## 2020-02-23 NOTE — Progress Notes (Signed)
Occupational Therapy Session Note  Patient Details  Name: Crystal Ashley MRN: [id number] Date of Birth: [birth year]  Today's Date: 02/23/2020 OT Individual Time: 1006-1040 OT Individual Time Calculation (min): 34 min    Short Term Goals: Week 2:  OT Short Term Goal 1 (Week 2): patient will complete functional transfers with CG/min A using RW OT Short Term Goal 2 (Week 2): patient will tolerate adl w/c level with min A using ADs OT Short Term Goal 3 (Week 2): patient will complete bed mobility with min A OT Short Term Goal 4 (Week 2): Patient with donn TLSO with mod assist for two consecutive sessions.  Skilled Therapeutic Interventions/Progress Updates:    Pt's son present for session and provided assist and encouragement for pt participation.  She was able to complete rolling side to side in the bed with max assist in order to clean up bowel incontinence.  After this occurred she expressed the need to use the bathroom and performed rolling again at mod assist level for use of the bed pain.  She then needed total assist for toilet hygiene in supine as well as for donning new brief.  Had her transfer to the EOB with max assist but she was unable to maintain sitting for more than a few seconds before she stated that she was having too much pain and could not do it anymore.  She was transitioned back to supine with max assist.  Call button and phone in reach with bed alarm in place.  Ice pack given for pain relief with nursing being notified as well.  Pt missed 26 mins of skilled OT secondary to refusal and increased pain.    Therapy Documentation Precautions:  Precautions Precautions: Back, Fall Required Braces or Orthoses: Spinal Brace Spinal Brace: Thoracolumbosacral orthotic, Applied in sitting position, Other (comment) Spinal Brace Comments: TLSO when OOB Restrictions Weight Bearing Restrictions: No RLE Weight Bearing: Weight bearing as tolerated LLE Weight Bearing: Weight bearing as  tolerated General: General OT Amount of Missed Time: 26 Minutes PT Missed Treatment Reason: Pain  Pain:  Pain at 6/10 on the faces scale with pain in the back pt repositioned as meds given earlier prior to session  ADL: See Care Tool Section for some details of mobility and selfcare  Therapy/Group: Individual Therapy  Alijah Hyde OTR/L 02/23/2020, 3:54 PM

## 2020-02-23 NOTE — Progress Notes (Signed)
Request to IR for second opinion on possible T10/11 KP/VP as patient wishes to avoid a surgical procedure if possible.  Patient history and imaging has been reviewed by Dr. Vernard Gambles today who does not recommend proceeding with T10 or T11 KP/VP in IR due to complexity of fracture and high likelihood of cement extravasation which has the potential to cause worsening cord compression symptoms, paralysis or even death. Additionally she has reported worsening leg weakness concerning for new neurologic symptoms and symptoms of low back pain which do not correspond to the areas of fracture and would likely not be helped by this procedure.   Agree with plan for neurosurgical management at this time - no plans for IR intervention, will defer further plans to their service.  Please call IR with any questions or concerns.  Candiss Norse, PA-C

## 2020-02-24 ENCOUNTER — Inpatient Hospital Stay (HOSPITAL_COMMUNITY): Payer: Medicare Other | Admitting: Physical Therapy

## 2020-02-24 ENCOUNTER — Inpatient Hospital Stay (HOSPITAL_COMMUNITY): Payer: Medicare Other | Admitting: Occupational Therapy

## 2020-02-24 DIAGNOSIS — D62 Acute posthemorrhagic anemia: Secondary | ICD-10-CM

## 2020-02-24 DIAGNOSIS — I480 Paroxysmal atrial fibrillation: Secondary | ICD-10-CM

## 2020-02-24 DIAGNOSIS — M8008XA Age-related osteoporosis with current pathological fracture, vertebra(e), initial encounter for fracture: Secondary | ICD-10-CM

## 2020-02-24 DIAGNOSIS — M546 Pain in thoracic spine: Secondary | ICD-10-CM

## 2020-02-24 DIAGNOSIS — E1165 Type 2 diabetes mellitus with hyperglycemia: Secondary | ICD-10-CM

## 2020-02-24 LAB — GLUCOSE, CAPILLARY
Glucose-Capillary: 249 mg/dL — ABNORMAL HIGH (ref 70–99)
Glucose-Capillary: 144 mg/dL — ABNORMAL HIGH (ref 70–99)
Glucose-Capillary: 172 mg/dL — ABNORMAL HIGH (ref 70–99)

## 2020-02-24 LAB — APTT
aPTT: 134 seconds — ABNORMAL HIGH (ref 24–36)
aPTT: 196 seconds (ref 24–36)
aPTT: 62 seconds — ABNORMAL HIGH (ref 24–36)

## 2020-02-24 LAB — CBC
HCT: 33.9 % — ABNORMAL LOW (ref 36.0–46.0)
Hemoglobin: 9.8 g/dL — ABNORMAL LOW (ref 12.0–15.0)
MCH: 26 pg (ref 26.0–34.0)
MCHC: 28.9 g/dL — ABNORMAL LOW (ref 30.0–36.0)
MCV: 89.9 fL (ref 80.0–100.0)
Platelets: 278 10*3/uL (ref 150–400)
RBC: 3.77 MIL/uL — ABNORMAL LOW (ref 3.87–5.11)
RDW: 17.3 % — ABNORMAL HIGH (ref 11.5–15.5)
WBC: 9.8 10*3/uL (ref 4.0–10.5)
nRBC: 0 % (ref 0.0–0.2)

## 2020-02-24 LAB — HEPARIN LEVEL (UNFRACTIONATED): Heparin Unfractionated: 1.34 IU/mL — ABNORMAL HIGH (ref 0.30–0.70)

## 2020-02-24 MED ORDER — HEPARIN (PORCINE) 25000 UT/250ML-% IV SOLN
700.0000 [IU]/h | INTRAVENOUS | Status: AC
  Administered 2020-02-24: 900 [IU]/h via INTRAVENOUS
  Filled 2020-02-24: qty 250

## 2020-02-24 MED ORDER — HEPARIN (PORCINE) 25000 UT/250ML-% IV SOLN: 900.0000 [IU]/h | INTRAVENOUS | Status: DC

## 2020-02-24 NOTE — Progress Notes (Signed)
Chaplain responded to page to notarize AD, Will and [employer].  Two hospitalists served as witnesses:  Dr. Maryland Pink and Dr. Candiss Norse.  Patient was alert and aware of what she was signing. Son had arranged for his associate, the hospitalist, to serve as witness knowing volunteers would not be available again until Monday.   Time was of the Essence that these documents be notarized prior to patient's surgery on Monday.  Son was gratified Crystal Ashley was able to notarize documents.  His mother was appreciative too and curious if I like donuts.  Chaplain and she talked about our favorite kinds.  Wished patient and son well and advised Chaplains are available if further assistance needed.  De Burrs Chaplain Resident

## 2020-02-24 NOTE — Progress Notes (Signed)
Physical Therapy Session Note  Patient Details  Name: Crystal Ashley MRN: [id number] Date of Birth: [birth year]  Today's Date: 02/24/2020 PT Missed Time: 75 Minutes Missed Time Reason: Pain  Pt received supine in bed, awake. Therapist encouraged pt to participate in skilled physical therapy even at bed level but pt declines stating her pain is "terrible." Pt reports she took pain medication. Therapist educated pt on importance of taking medications as prescribed and participating in therapy to maintain strength prior to planned surgery. Pt verbalizes understanding but continues to decline session at this time. Tameka, RN notified that pt unable to participate in session. Pt left supine in bed with needs in reach and lines intact. Missed 75 minutes of skilled physical therapy.  Tawana Scale, PT, DPT, CSRS  02/24/2020, 7:49 AM

## 2020-02-24 NOTE — Progress Notes (Signed)
[place] for rivarox >> heparin Indication: atrial fibrillation  No Known Allergies  Patient Measurements: Height: 5' (152.4 cm) Weight: 91.3 kg (201 lb 4.5 oz) IBW/kg (Calculated) : 45.5 Heparin Dosing Weight: 67 kg  Vital Signs: Temp: 98.5 F (36.9 C) (07/09 1939) Temp Source: Oral (07/09 1939) BP: 130/82 (07/09 1939) Pulse Rate: 51 (07/09 1939)  Labs: Recent Labs    02/22/20 0811 02/24/20 0001  HGB 10.6* 9.8*  HCT 35.8* 33.9*  PLT 303 278  APTT  --  62*    Estimated Creatinine Clearance: 53.5 mL/min (by C-G formula based on SCr of 0.93 mg/dL).   Assessment: 74 yo W on rivaroxban PTA for afib (CHADS2VASc = 4) now transitioning to heparin gtt for planned Neurosurgery on 7/12. Last dose of rivaroxaban given 7/8 at 17:50. Will start heparin around next rivaroxaban due time and follow aPTT until correlates with heparin level.  PTT 62 sec (subtherapeutic) on gtt at 1000 units/hr - noted level drawn only ~5 hrs post gtt start. No issues with line or bleeding reported per RN.  Goal of Therapy:  Heparin level 0.3-0.7 units/ml; PTT 66-102 sec Monitor platelets by anticoagulation protocol: Yes   Plan:  Increase heparin to 1100 units/hr  Will f/u 8 hr PTT   Sherlon Handing, PharmD, BCPS Please see amion for complete clinical pharmacist phone list 02/24/2020 12:51 AM

## 2020-02-24 NOTE — Progress Notes (Signed)
[place] for heparin Indication: atrial fibrillation  No Known Allergies  Patient Measurements: Height: 5' (152.4 cm) Weight: 91 kg (200 lb 9.9 oz) IBW/kg (Calculated) : 45.5 Heparin Dosing Weight: 67 kg  Vital Signs: Temp: 97.8 F (36.6 C) (07/10 2042) Temp Source: Oral (07/10 1510) BP: 135/79 (07/10 2042) Pulse Rate: 57 (07/10 2042)  Labs: Recent Labs    02/22/20 0811 02/24/20 0001 02/24/20 1049 02/24/20 2046  HGB 10.6* 9.8*  --   --   HCT 35.8* 33.9*  --   --   PLT 303 278  --   --   APTT  --  62* 196* 134*  HEPARINUNFRC  --  1.34*  --   --     Estimated Creatinine Clearance: 53.4 mL/min (by C-G formula based on SCr of 0.93 mg/dL).   Assessment: 74 yo W on rivaroxban PTA for afib (CHADS2VASc = 4) transitioned to heparin gtt for planned Neurosurgery on 7/12. Last dose of rivaroxaban given 7/8 at 17:50. Will start heparin around next rivaroxaban due time and follow aPTT until correlates with heparin level.  PTT still prolonged at 134 sec after decrease to 900 units/hr. No bleeding issues noted. Will repeat aPTT in ~6-8 hours and repeat BMET in AM.   Goal of Therapy:  Heparin level 0.3-0.7 units/ml; PTT 66-102 sec Monitor platelets by anticoagulation protocol: Yes   Plan:  Decrease heparin to 700 units/hr - hold for 30 min  Will f/u ~6-8 hour aPTT  Erin Hearing PharmD., BCPS Clinical Pharmacist 02/24/2020 10:46 PM

## 2020-02-24 NOTE — Progress Notes (Signed)
Patient noted in bed this evening, alert & responsive. She has a heparin drip running. No c/o pain at the time. During assessment, noted a fissure to the coccyx region, right in the center. Areas was cleansed with NS & a foam dressing applied. Medications were given as ordered. Will continue to monitor

## 2020-02-24 NOTE — Progress Notes (Signed)
[place] for rivarox >> heparin Indication: atrial fibrillation  No Known Allergies  Patient Measurements: Height: 5' (152.4 cm) Weight: 91.3 kg (201 lb 4.5 oz) IBW/kg (Calculated) : 45.5 Heparin Dosing Weight: 67 kg  Vital Signs: Temp: 98.6 F (37 C) (07/10 0427) BP: 144/69 (07/10 0427) Pulse Rate: 58 (07/10 0427)  Labs: Recent Labs    02/22/20 0811 02/24/20 0001 02/24/20 1049  HGB 10.6* 9.8*  --   HCT 35.8* 33.9*  --   PLT 303 278  --   APTT  --  62* 196*  HEPARINUNFRC  --  1.34*  --     Estimated Creatinine Clearance: 53.5 mL/min (by C-G formula based on SCr of 0.93 mg/dL).   Assessment: 74 yo W on rivaroxban PTA for afib (CHADS2VASc = 4) transitioned to heparin gtt for planned Neurosurgery on 7/12. Last dose of rivaroxaban given 7/8 at 17:50. Will start heparin around next rivaroxaban due time and follow aPTT until correlates with heparin level.  PTT now prolonged at 196 sec after increase to 1100 units/hr - per RN, lab was drawn from opposite arm and no issues with line or bleeding reported.  Due to this, will hold heparin x1 hour and reduce rate. Will repeat aPTT in ~6-8 hours and repeat BMET in AM.   Goal of Therapy:  Heparin level 0.3-0.7 units/ml; PTT 66-102 sec Monitor platelets by anticoagulation protocol: Yes   Plan:  Hold Heparin x1 hour then resume IV Heparin at decreased rate of 900 units/hr  Will f/u ~6-8 hour aPTT  Sloan Leiter, PharmD, BCPS, BCCCP Clinical Pharmacist Please refer to [facility] for [place] numbers 02/24/2020 12:25 PM

## 2020-02-24 NOTE — Progress Notes (Signed)
Occupational Therapy Session Note  Patient Details  Name: Crystal Ashley MRN: [id number] Date of Birth: 12/14/1945  Today's Date: 02/24/2020 OT Individual Time: 1100-1156 OT Individual Time Calculation (min): 56 min   Skilled Therapeutic Interventions/Progress Updates:    Pt greeted in bed with son present. Pt agreeable to get washed up today with encouragement, requesting bedlevel therapy due to back pain. OT set pt up in modified chair position to engage in UB self care and grooming tasks. Pt tried to use the reacher to remove gripper socks but was unable to due to pain. Total A for LB self care including pericare and brief change with pt rolling Rt>Lt using bedrails with Max A. Note that she was incontinent of urine in brief. OT washed her hair using the shampoo cap with pt smiling, stating "that feels so good." +2 for boosting pt up in bed. Pt remained in bed with all needs within reach and bed alarm set. Tx focus placed on activity tolerance, psychosocial health, and ADL retraining.    Therapy Documentation Precautions:  Precautions Precautions: Back, Fall Required Braces or Orthoses: Spinal Brace Spinal Brace: Thoracolumbosacral orthotic, Applied in sitting position, Other (comment) Spinal Brace Comments: TLSO when OOB Restrictions Weight Bearing Restrictions: No RLE Weight Bearing: Weight bearing as tolerated LLE Weight Bearing: Weight bearing as tolerated ADL: ADL Eating: Supervision/safety, Set up Where Assessed-Eating: Bed level Grooming: Other (comment) (declined due to fatigue) Upper Body Bathing: Minimal assistance Where Assessed-Upper Body Bathing: Bed level Lower Body Bathing: Dependent Where Assessed-Lower Body Bathing: Bed level Upper Body Dressing: Moderate assistance Where Assessed-Upper Body Dressing: Bed level Lower Body Dressing: Dependent Where Assessed-Lower Body Dressing: Bed level ADL Comments: patient declined OOB for OT eval due to pain and fatigue       Therapy/Group: Individual Therapy  Tenya Araque A Verneal Wiers 02/24/2020, 12:18 PM

## 2020-02-24 NOTE — Progress Notes (Signed)
Pt refusing to be moved into air mattress bed. She states "I will do it when I come back from surgery. My back hurts to much"

## 2020-02-24 NOTE — Plan of Care (Signed)
  Problem: Consults Goal: RH GENERAL PATIENT EDUCATION Description: See Patient Education module for education specifics. Outcome: Progressing Goal: Skin Care Protocol Initiated - if Braden Score 18 or less Description: If consults are not indicated, leave blank or document N/A Outcome: Progressing   Problem: RH BLADDER ELIMINATION Goal: RH STG MANAGE BLADDER WITH ASSISTANCE Description: STG Manage Bladder With mod I Assistance Outcome: Progressing   Problem: RH SKIN INTEGRITY Goal: RH STG SKIN FREE OF INFECTION/BREAKDOWN Description: Patients skin will remain free from further infection or breakdown with min assist.  Outcome: Progressing Goal: RH STG MAINTAIN SKIN INTEGRITY WITH ASSISTANCE Description: STG Maintain Skin Integrity With min Assistance. Outcome: Progressing   Problem: RH SAFETY Goal: RH STG ADHERE TO SAFETY PRECAUTIONS W/ASSISTANCE/DEVICE Description: STG Adhere to Safety Precautions With mod I Assistance/Device. Outcome: Progressing   Problem: RH PAIN MANAGEMENT Goal: RH STG PAIN MANAGED AT OR BELOW PT'S PAIN GOAL Description: < 4 Outcome: Progressing

## 2020-02-24 NOTE — Progress Notes (Signed)
[facility] PHYSICAL MEDICINE & REHABILITATION PROGRESS NOTE   Subjective/Complaints: Patient seen laying in bed this morning.  She states she slept fairly overnight due to back pain.  Discussed current status, prognosis, and future interventions with.,  Attending provider, neuropsychology.  She was seen by VIR and neurosurgeon yesterday, notes reviewed-no intervention per VIR, plan for surgical intervention on Monday.  ROS: Denies CP, SOB. N/V/D  Objective:   CT THORACIC SPINE WO CONTRAST  Result Date: 02/22/2020 CLINICAL DATA:  Spinal fracture EXAM: CT THORACIC AND LUMBAR SPINE WITHOUT CONTRAST TECHNIQUE: Multidetector CT imaging of the thoracic and lumbar spine was performed without contrast. Multiplanar CT image reconstructions were also generated. COMPARISON:  Thoracic spine MRI 02/21/2020 Thoracolumbar spine CT 02/06/2020 FINDINGS: CT THORACIC SPINE FINDINGS Alignment: Normal. Vertebrae: Fracture of T10 with anterior distraction that now measures 11 mm, previously 8 mm. Mild progression of height loss at T11. There are flowing anterior syndesmophytes along the length of thoracic spine. Paraspinal and other soft tissues: Bilateral pleural effusions. Cardiomegaly. Mild pulmonary edema. Disc levels: There is moderate spinal canal stenosis at the T10-11 level. There is also severe bilateral foraminal stenosis at this level. CT LUMBAR SPINE FINDINGS Segmentation: 5 lumbar type vertebrae. Alignment: Normal. Vertebrae: Chronic height loss at L1 of approximately 10% anteriorly. No retropulsion. Paraspinal and other soft tissues: Calcific aortic atherosclerosis. Disc levels: T12-L1: Mild spinal canal stenosis due to disc bulge and facet hypertrophy. L1-2: No spinal canal stenosis or neural impingement. L2-3: No spinal canal stenosis or neural impingement. L3-4: Mild spinal canal stenosis due to facet hypertrophy and disc bulge. L4-5: Moderate spinal canal stenosis due to disc bulge and facet hypertrophy.  Mild right and severe left foraminal stenosis. L5-S1: Disc space narrowing mild spinal canal stenosis. Mild right and moderate left foraminal stenosis. IMPRESSION: 1. T10 fracture with anterior distraction that now measures 11 mm, previously 8 mm. 2. Mild progression of height loss of the T11 compression fracture. 3. 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 4. Severe left foraminal stenosis at L4-5 due to disc bulge and facet hypertrophy. 5. Chronic height loss at L1 of approximately 10% anteriorly. 6. Bilateral pleural effusions and mild pulmonary edema. 7. Flowing anterior syndesmophytes along the length of the thoracic spine). 8. Aortic Atherosclerosis (ICD10-I70.0). Electronically Signed   By: Ulyses Jarred M.D.   On: 02/22/2020 21:03   CT LUMBAR SPINE WO CONTRAST  Result Date: 02/22/2020 CLINICAL DATA:  Spinal fracture EXAM: CT THORACIC AND LUMBAR SPINE WITHOUT CONTRAST TECHNIQUE: Multidetector CT imaging of the thoracic and lumbar spine was performed without contrast. Multiplanar CT image reconstructions were also generated. COMPARISON:  Thoracic spine MRI 02/21/2020 Thoracolumbar spine CT 02/06/2020 FINDINGS: CT THORACIC SPINE FINDINGS Alignment: Normal. Vertebrae: Fracture of T10 with anterior distraction that now measures 11 mm, previously 8 mm. Mild progression of height loss at T11. There are flowing anterior syndesmophytes along the length of thoracic spine. Paraspinal and other soft tissues: Bilateral pleural effusions. Cardiomegaly. Mild pulmonary edema. Disc levels: There is moderate spinal canal stenosis at the T10-11 level. There is also severe bilateral foraminal stenosis at this level. CT LUMBAR SPINE FINDINGS Segmentation: 5 lumbar type vertebrae. Alignment: Normal. Vertebrae: Chronic height loss at L1 of approximately 10% anteriorly. No retropulsion. Paraspinal and other soft tissues: Calcific aortic atherosclerosis. Disc levels: T12-L1: Mild spinal  canal stenosis due to disc bulge and facet hypertrophy. L1-2: No spinal canal stenosis or neural impingement. L2-3: No spinal canal stenosis or neural impingement. L3-4: Mild spinal  canal stenosis due to facet hypertrophy and disc bulge. L4-5: Moderate spinal canal stenosis due to disc bulge and facet hypertrophy. Mild right and severe left foraminal stenosis. L5-S1: Disc space narrowing mild spinal canal stenosis. Mild right and moderate left foraminal stenosis. IMPRESSION: 1. T10 fracture with anterior distraction that now measures 11 mm, previously 8 mm. 2. Mild progression of height loss of the T11 compression fracture. 3. 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 4. Severe left foraminal stenosis at L4-5 due to disc bulge and facet hypertrophy. 5. Chronic height loss at L1 of approximately 10% anteriorly. 6. Bilateral pleural effusions and mild pulmonary edema. 7. Flowing anterior syndesmophytes along the length of the thoracic spine). 8. Aortic Atherosclerosis (ICD10-I70.0). Electronically Signed   By: Ulyses Jarred M.D.   On: 02/22/2020 21:03   Recent Labs    02/22/20 0811 02/24/20 0001  WBC 8.9 9.8  HGB 10.6* 9.8*  HCT 35.8* 33.9*  PLT 303 278   No results for input(s): NA, K, CL, CO2, GLUCOSE, BUN, CREATININE, CALCIUM in the last 72 hours.  Intake/Output Summary (Last 24 hours) at 02/24/2020 1233 Last data filed at 02/24/2020 2706 Gross per 24 hour  Intake 340 ml  Output 300 ml  Net 40 ml     Physical Exam: Vital Signs Blood pressure (!) 144/69, pulse (!) 58, temperature 98.6 F (37 C), resp. rate 17, height 5' (1.524 m), weight 91.3 kg, SpO2 99 %. Constitutional: No distress . Vital signs reviewed. HENT: Normocephalic.  Atraumatic. Eyes: EOMI. No discharge. Cardiovascular: No JVD. Respiratory: Normal effort.  No stridor. GI: Non-distended. Skin: Warm and dry.  Intact. Psych: Normal mood.  Normal behavior. Musc: No edema in extremities.   No tenderness in extremities. Neuro: Alert Motor: Bilateral upper extremities: 4/5 proximal distal Right lower extremity: 2+/5 proximal distal Left lower extremity: 2+/5 proximal to distal   Assessment/Plan: 1. Functional deficits secondary to multiple fractures following fall, which require 3+ hours per day of interdisciplinary therapy in a comprehensive inpatient rehab setting. Needing 15/7 schedule- very sedentary PTA  Physiatrist is providing close team supervision and 24 hour management of active medical problems listed below.  Physiatrist and rehab team continue to assess barriers to discharge/monitor patient progress toward functional and medical goals  Care Tool:  Bathing    Body parts bathed by patient: Right arm, Left arm, Chest, Abdomen, Right upper leg, Left upper leg, Face, Front perineal area   Body parts bathed by helper: Buttocks, Right lower leg, Left lower leg Body parts n/a: Right lower leg, Left lower leg   Bathing assist Assist Level: Moderate Assistance - Patient 50 - 74%     Upper Body Dressing/Undressing Upper body dressing Upper body dressing/undressing activity did not occur (including orthotics): N/A What is the patient wearing?: Dress    Upper body assist Assist Level: Maximal Assistance - Patient 25 - 49%    Lower Body Dressing/Undressing Lower body dressing      What is the patient wearing?: Incontinence brief     Lower body assist Assist for lower body dressing: Total Assistance - Patient < 25%     Toileting Toileting    Toileting assist Assist for toileting: Total Assistance - Patient < 25%     Transfers Chair/bed transfer  Transfers assist     Chair/bed transfer assist level: Dependent - mechanical lift Chair/bed transfer assistive device: Mechanical lift (stedy)   Locomotion Ambulation   Ambulation assist      Assist level:  Minimal Assistance - Patient > 75% Assistive device: Parallel bars Max distance: 8   Walk 10  feet activity   Assist  Walk 10 feet activity did not occur: Safety/medical concerns  Assist level: Minimal Assistance - Patient > 75% Assistive device: Walker-rolling   Walk 50 feet activity   Assist Walk 50 feet with 2 turns activity did not occur: Safety/medical concerns  Assist level: Minimal Assistance - Patient > 75% Assistive device: Walker-rolling    Walk 150 feet activity   Assist Walk 150 feet activity did not occur: Safety/medical concerns         Walk 10 feet on uneven surface  activity   Assist Walk 10 feet on uneven surfaces activity did not occur: Safety/medical concerns         Wheelchair     Assist Will patient use wheelchair at discharge?: No (if used w/c would be for dependent community transport)             Wheelchair 50 feet with 2 turns activity    Assist            Wheelchair 150 feet activity     Assist          Blood pressure (!) 144/69, pulse (!) 58, temperature 98.6 F (37 C), resp. rate 17, height 5' (1.524 m), weight 91.3 kg, SpO2 99 %.  Medical Problem List and Plan: 1.  Impaired mobility and ADLs secondary to multiple traumatic injuries following fall, possibly secondary to starting Cardizem.  Continue CIR as tolerated  No intervention planned by VIR, defer to neurosurgery  Surgical fixation per neurosurgery plan for Monday 2.  Antithrombotics: -DVT/anticoagulation:  Pharmaceutical: Xarelto on hold pending procedure, heparin GGT   Extremely elevated on 7/12, hold x1 hour             -antiplatelet therapy:N/a 3. Pain Management: Patient feels nauseous with opioids mainly with the oxycodone, increased tramadol.   Scheduled Tylenol and added lidocaine patch and kpad for lumbar paraspinals where pain is worst.   Appears relatively controlled on 7/10 4. Mood: Team to provide ego support. LCSW to follow for evaluation and support.              -antipsychotic agents: N/A 5. Neuropsych: This patient is  capable of making decisions on her own behalf. 6. Skin/Wound Care: Routine pressure relief measures.  7. Fluids/Electrolytes/Nutrition: Monitor I/O.  8. T10/T11 fractures: TLSO when at edge of bed and out of bed. Encouraged to wear in bed for support comfort if tolerated.  9. Right pelvic fracture: WBAT 10. T2DM with hyperglycemia: Hgb A1c- 6.8. will monitor BS ac/hs. Resume metformin once intake improves.  CBG (last 3)  Recent Labs    02/23/20 1625 02/23/20 2049 02/24/20 1202  GLUCAP 181* 144* 249*   Labile on 7/10, monitor for trend 11. PAF: Monitor HR --continue Xarelto, Cardizem and Metoprolol XL daily.  Vitals:   02/23/20 1939 02/24/20 0427  BP: 130/82 (!) 144/69  Pulse: (!) 51 (!) 58  Resp: 17 17  Temp: 98.5 F (36.9 C) 98.6 F (37 C)  SpO2: 99% 99%   Relatively controlled on 7/10 12. ABLA: Due to fracture/hematoma.    Hemoglobin 9.8 on 7/10  Continue to monitor 13. Leucocytosis: resolved  14. Vitamin D deficiency: was on supplement at home.    6/29: Level is normal. Will not restart supplement.  15. Vitamin B12 deficiency: was on supplement at home.   6/29: Level is normal. Will not restart supplement.  15. Disposition: Patient lives with her son and his fiance.  LOS: 12 days A FACE TO FACE EVALUATION WAS PERFORMED  Esvin Hnat [name] 02/24/2020, 12:33 PM

## 2020-02-25 ENCOUNTER — Telehealth: Payer: Self-pay | Admitting: Cardiology

## 2020-02-25 ENCOUNTER — Inpatient Hospital Stay (HOSPITAL_COMMUNITY): Payer: Medicare Other

## 2020-02-25 ENCOUNTER — Inpatient Hospital Stay (HOSPITAL_COMMUNITY): Payer: Medicare Other | Admitting: Occupational Therapy

## 2020-02-25 DIAGNOSIS — I4891 Unspecified atrial fibrillation: Secondary | ICD-10-CM

## 2020-02-25 DIAGNOSIS — Z01810 Encounter for preprocedural cardiovascular examination: Secondary | ICD-10-CM

## 2020-02-25 DIAGNOSIS — M8008XS Age-related osteoporosis with current pathological fracture, vertebra(e), sequela: Secondary | ICD-10-CM

## 2020-02-25 LAB — HEPARIN LEVEL (UNFRACTIONATED)
Heparin Unfractionated: 0.41 IU/mL (ref 0.30–0.70)
Heparin Unfractionated: 0.39 IU/mL (ref 0.30–0.70)

## 2020-02-25 LAB — ABO/RH: ABO/RH(D): O POS

## 2020-02-25 LAB — GLUCOSE, CAPILLARY
Glucose-Capillary: 146 mg/dL — ABNORMAL HIGH (ref 70–99)
Glucose-Capillary: 146 mg/dL — ABNORMAL HIGH (ref 70–99)
Glucose-Capillary: 153 mg/dL — ABNORMAL HIGH (ref 70–99)
Glucose-Capillary: 163 mg/dL — ABNORMAL HIGH (ref 70–99)
Glucose-Capillary: 207 mg/dL — ABNORMAL HIGH (ref 70–99)

## 2020-02-25 LAB — CREATININE, SERUM
Creatinine, Ser: 0.85 mg/dL (ref 0.44–1.00)
GFR calc Af Amer: >60 mL/min (ref >60)
GFR calc non Af Amer: >60 mL/min (ref >60)

## 2020-02-25 LAB — CBC
HCT: 35.8 % — ABNORMAL LOW (ref 36.0–46.0)
Hemoglobin: 10.9 g/dL — ABNORMAL LOW (ref 12.0–15.0)
MCH: 27.4 pg (ref 26.0–34.0)
MCHC: 30.4 g/dL (ref 30.0–36.0)
MCV: 89.9 fL (ref 80.0–100.0)
Platelets: 269 10*3/uL (ref 150–400)
RBC: 3.98 MIL/uL (ref 3.87–5.11)
RDW: 17.5 % — ABNORMAL HIGH (ref 11.5–15.5)
WBC: 10.3 10*3/uL (ref 4.0–10.5)
nRBC: 0.6 % — ABNORMAL HIGH (ref 0.0–0.2)

## 2020-02-25 LAB — TYPE AND SCREEN
ABO/RH(D): O POS
Antibody Screen: NEGATIVE

## 2020-02-25 LAB — APTT
aPTT: 74 seconds — ABNORMAL HIGH (ref 24–36)
aPTT: 70 seconds — ABNORMAL HIGH (ref 24–36)

## 2020-02-25 LAB — SURGICAL PCR SCREEN
MRSA, PCR: NEGATIVE
Staphylococcus aureus: NEGATIVE

## 2020-02-25 NOTE — Progress Notes (Signed)
[facility] PHYSICAL MEDICINE & REHABILITATION PROGRESS NOTE   Subjective/Complaints: Patient seen laying in bed this morning.  She states she slept well overnight.  Discussed plans for surgery tomorrow.  Discussed with nursing preop orders.  She was seen by cardiology, notes reviewed-cleared for surgery.  ROS: + Back pain.  Denies CP, SOB. N/V/D  Objective:   No results found. Recent Labs    02/24/20 0001 02/25/20 0830  WBC 9.8 10.3  HGB 9.8* 10.9*  HCT 33.9* 35.8*  PLT 278 269   Recent Labs    02/25/20 0830  CREATININE 0.85    Intake/Output Summary (Last 24 hours) at 02/25/2020 1559 Last data filed at 02/25/2020 1230 Gross per 24 hour  Intake 250 ml  Output 475 ml  Net -225 ml     Physical Exam: Vital Signs Blood pressure (!) 146/80, pulse (!) 59, temperature 98.5 F (36.9 C), resp. rate 16, height 5' (1.524 m), weight 87.8 kg, SpO2 98 %. Constitutional: No distress . Vital signs reviewed. HENT: Normocephalic.  Atraumatic. Eyes: EOMI. No discharge. Cardiovascular: No JVD. Respiratory: Normal effort.  No stridor. GI: Non-distended. Skin: Warm and dry.  Intact. Psych: Normal mood.  Normal behavior. Musc: No edema in extremities.  No tenderness in extremities. Neuro: Alert Motor: Bilateral upper extremities: 4/5 proximal distal Right lower extremity: 2-/5 proximal distal, some pain inhibition Left lower extremity: 2-/5 proximal to distal, some pain inhibition  Assessment/Plan: 1. Functional deficits secondary to multiple fractures following fall, which require 3+ hours per day of interdisciplinary therapy in a comprehensive inpatient rehab setting. Needing 15/7 schedule- very sedentary PTA  Physiatrist is providing close team supervision and 24 hour management of active medical problems listed below.  Physiatrist and rehab team continue to assess barriers to discharge/monitor patient progress toward functional and medical goals  Care Tool:  Bathing    Body  parts bathed by patient: Right arm, Left arm, Chest, Abdomen, Right upper leg, Left upper leg, Face, Front perineal area   Body parts bathed by helper: Buttocks, Right lower leg, Left lower leg Body parts n/a: Right lower leg, Left lower leg   Bathing assist Assist Level: Moderate Assistance - Patient 50 - 74%     Upper Body Dressing/Undressing Upper body dressing Upper body dressing/undressing activity did not occur (including orthotics): N/A What is the patient wearing?: Dress    Upper body assist Assist Level: Maximal Assistance - Patient 25 - 49%    Lower Body Dressing/Undressing Lower body dressing      What is the patient wearing?: Incontinence brief     Lower body assist Assist for lower body dressing: Total Assistance - Patient < 25%     Toileting Toileting    Toileting assist Assist for toileting: Total Assistance - Patient < 25%     Transfers Chair/bed transfer  Transfers assist     Chair/bed transfer assist level: Dependent - mechanical lift Chair/bed transfer assistive device: Mechanical lift (stedy)   Locomotion Ambulation   Ambulation assist      Assist level: Minimal Assistance - Patient > 75% Assistive device: Parallel bars Max distance: 8   Walk 10 feet activity   Assist  Walk 10 feet activity did not occur: Safety/medical concerns  Assist level: Minimal Assistance - Patient > 75% Assistive device: Walker-rolling   Walk 50 feet activity   Assist Walk 50 feet with 2 turns activity did not occur: Safety/medical concerns  Assist level: Minimal Assistance - Patient > 75% Assistive device: Walker-rolling  Walk 150 feet activity   Assist Walk 150 feet activity did not occur: Safety/medical concerns         Walk 10 feet on uneven surface  activity   Assist Walk 10 feet on uneven surfaces activity did not occur: Safety/medical concerns         Wheelchair     Assist Will patient use wheelchair at discharge?: No (if  used w/c would be for dependent community transport)             Wheelchair 50 feet with 2 turns activity    Assist            Wheelchair 150 feet activity     Assist          Blood pressure (!) 146/80, pulse (!) 59, temperature 98.5 F (36.9 C), resp. rate 16, height 5' (1.524 m), weight 87.8 kg, SpO2 98 %.  Medical Problem List and Plan: 1.  Impaired mobility and ADLs secondary to multiple traumatic injuries following fall, possibly secondary to starting Cardizem.  Continue CIR as tolerated  No intervention planned by VIR, defer to neurosurgery  Surgical fixation per neurosurgery plan for tomorrow, cardiac clearance granted 2.  Antithrombotics: -DVT/anticoagulation:  Pharmaceutical: Xarelto on hold pending procedure, heparin GGT   Remains elevated on 7/11             -antiplatelet therapy:N/a 3. Pain Management: Patient feels nauseous with opioids mainly with the oxycodone, increased tramadol.   Scheduled Tylenol and added lidocaine patch and kpad for lumbar paraspinals where pain is worst.   Appears relatively controlled on 7/11 4. Mood: Team to provide ego support. LCSW to follow for evaluation and support.              -antipsychotic agents: N/A 5. Neuropsych: This patient is capable of making decisions on her own behalf. 6. Skin/Wound Care: Routine pressure relief measures.  7. Fluids/Electrolytes/Nutrition: Monitor I/O.   Poor p.o. intake-we will need to address post procedure 8. T10/T11 fractures: TLSO when at edge of bed and out of bed. Encouraged to wear in bed for support comfort if tolerated.   See #1 9. Right pelvic fracture: WBAT 10. T2DM with hyperglycemia: Hgb A1c- 6.8. will monitor BS ac/hs. Resume metformin once intake improves.  CBG (last 3)  Recent Labs    02/24/20 2043 02/25/20 0601 02/25/20 1204  GLUCAP 172* 146* 207*   Elevated on 7/11, would not make any changes at this time 11. PAF: Monitor HR --currently on heparin GGT,  Cardizem and Metoprolol XL daily.  Vitals:   02/25/20 0808 02/25/20 1517  BP: (!) 144/78 (!) 146/80  Pulse: 60 (!) 59  Resp:  16  Temp:  98.5 F (36.9 C)  SpO2:  98%   Relatively controlled on 7/11 12. ABLA: Due to fracture/hematoma.    Hemoglobin 10.9 on 7/11  Continue to monitor 13. Leucocytosis: resolved  14. Vitamin D deficiency: was on supplement at home.    6/29: Level is normal. Will not restart supplement.  15. Vitamin B12 deficiency: was on supplement at home.   6/29: Level is normal. Will not restart supplement.  15. Disposition: Patient lives with her son and his fiance.  LOS: 13 days A FACE TO FACE EVALUATION WAS PERFORMED  Draydon Clairmont [name] 02/25/2020, 3:59 PM

## 2020-02-25 NOTE — Anesthesia Preprocedure Evaluation (Addendum)
Anesthesia Evaluation  Patient identified by MRN, date of birth, ID band Patient awake    Reviewed: Allergy & Precautions, NPO status , Patient's Chart, lab work & pertinent test results  Airway Mallampati: II  TM Distance: >3 FB Neck ROM: Full    Dental  (+) Teeth Intact, Dental Advisory Given   Pulmonary neg pulmonary ROS,    Pulmonary exam normal breath sounds clear to auscultation       Cardiovascular hypertension, Pt. on medications and Pt. on home beta blockers +CHF  + dysrhythmias Atrial Fibrillation  Rhythm:Irregular Rate:Abnormal     Neuro/Psych thoracic stenosis  negative psych ROS   GI/Hepatic negative GI ROS, Neg liver ROS,   Endo/Other  diabetes, Type 2, Oral Hypoglycemic AgentsHypothyroidism Obesity   Renal/GU negative Renal ROS     Musculoskeletal negative musculoskeletal ROS (+)   Abdominal   Peds  Hematology  (+) Blood dyscrasia, anemia ,   Anesthesia Other Findings   Reproductive/Obstetrics                            Anesthesia Physical Anesthesia Plan  ASA: III  Anesthesia Plan: General   Post-op Pain Management:    Induction: Intravenous  PONV Risk Score and Plan: 3 and Dexamethasone, Ondansetron and Treatment may vary due to age or medical condition  Airway Management Planned: Oral ETT  Additional Equipment:   Intra-op Plan:   Post-operative Plan: Extubation in OR  Informed Consent: I have reviewed the patients History and Physical, chart, labs and discussed the procedure including the risks, benefits and alternatives for the proposed anesthesia with the patient or authorized representative who has indicated his/her understanding and acceptance.     Dental advisory given  Plan Discussed with: CRNA  Anesthesia Plan Comments: (2nd PIV)       Anesthesia Quick Evaluation

## 2020-02-25 NOTE — Progress Notes (Signed)
ANTICOAGULATION CONSULT NOTE   Pharmacy Consult for heparin Indication: atrial fibrillation  No Known Allergies  Patient Measurements: Height: 5' (152.4 cm) Weight: 87.8 kg (193 lb 9 oz) IBW/kg (Calculated) : 45.5 Heparin Dosing Weight: 67 kg  Vital Signs: Temp: 97.6 F (36.4 C) (07/11 0510) BP: 144/78 (07/11 0808) Pulse Rate: 60 (07/11 0808)  Labs: Recent Labs    02/24/20 0001 02/24/20 0001 02/24/20 1049 02/24/20 2046 02/25/20 0830 02/25/20 0842  HGB 9.8*  --   --   --  10.9*  --   HCT 33.9*  --   --   --  35.8*  --   PLT 278  --   --   --  269  --   APTT 62*   < > 196* 134*  --  74*  HEPARINUNFRC 1.34*  --   --   --  0.41  --   CREATININE  --   --   --   --  0.85  --    < > = values in this interval not displayed.    Estimated Creatinine Clearance: 57.2 mL/min (by C-G formula based on SCr of 0.85 mg/dL).   Assessment: 74 yo W on rivaroxban PTA for afib (CHADS2VASc = 4) transitioned to heparin gtt for planned Neurosurgery on 7/12. Last dose of rivaroxaban given 7/8 at 17:50. Will start heparin around next rivaroxaban due time and follow aPTT until correlates with heparin level.  PTT in range at 74 after decrease to 700 units/hr. No bleeding issues per patient. Will repeat aPTT in ~6-8 hours to confirm still in range.  Goal of Therapy:  Heparin level 0.3-0.7 units/ml; PTT 66-102 sec Monitor platelets by anticoagulation protocol: Yes   Plan:  Continue heparin at 700 units/hr. Will f/u ~6-8 hour aPTT.  Rebbeca Paul, PharmD PGY1 Pharmacy Resident 02/25/2020 9:33 AM  Please check [email or web address] for unit-specific pharmacy phone numbers.

## 2020-02-25 NOTE — Plan of Care (Signed)
  Problem: Consults Goal: RH GENERAL PATIENT EDUCATION Description: See Patient Education module for education specifics. Outcome: Progressing   Problem: RH BLADDER ELIMINATION Goal: RH STG MANAGE BLADDER WITH ASSISTANCE Description: STG Manage Bladder With mod I Assistance Outcome: Progressing   Problem: RH SKIN INTEGRITY Goal: RH STG SKIN FREE OF INFECTION/BREAKDOWN Description: Patients skin will remain free from further infection or breakdown with min assist.  Outcome: Progressing Goal: RH STG MAINTAIN SKIN INTEGRITY WITH ASSISTANCE Description: STG Maintain Skin Integrity With min Assistance. Outcome: Progressing   Problem: RH SAFETY Goal: RH STG ADHERE TO SAFETY PRECAUTIONS W/ASSISTANCE/DEVICE Description: STG Adhere to Safety Precautions With mod I Assistance/Device. Outcome: Progressing   Problem: RH PAIN MANAGEMENT Goal: RH STG PAIN MANAGED AT OR BELOW PT'S PAIN GOAL Description: < 4 Outcome: Progressing

## 2020-02-25 NOTE — Telephone Encounter (Signed)
I received a call regarding cardiac clearance for the patient to undergo back surgery.  Since fall has had multiple fractures and now is in severe pain and unable to mobilize due to pain.  Patient's baseline status is also very poor, recently she has been pretty much bedbound/chair bound with minimal activity.  I discussed with her son regarding overall high risk for periprocedural complications but not necessarily cardiac with regard to development of decub ulcerations, infection, aspiration pneumonia are extremely high.  Also patient is presently on Xarelto for permanent atrial fibrillation.  Otherwise from cardiac standpoint she is stable.  Patient's son, [name] understands the risk and surgery is being performed for compassionate reasons in view of continued pain and inability to mobilize.  Patient has a nephew Jeanie Cooks, who is a physician and I have strongly advised [name] not to have multiple people involved as it was a big hindrance by prior experience in taking care of patient's husband.   I have made it very clear about the risks and possible mortality as well. Son understands. Please proceed with surgery as planned. Call if cardiac issues were to arise. I spent 30 minutes with coordination of care, telephone call with patient's son and review of records. I d/w Algis Liming, PA-C (date of encounter 02/23/2020 at 5:30 pm approximate).    Adrian Prows, MD, [facility] 02/25/2020, 8:56 AM Office: [phone]

## 2020-02-25 NOTE — Progress Notes (Signed)
Physical Therapy Session Note  Patient Details  Name: Crystal Ashley MRN: [id number] Date of Birth: 08-11-1946  Today's Date: 02/25/2020 PT Individual Time: 0810-0915 PT Individual Time Calculation (min): 65 min   Short Term Goals: Week 1:  PT Short Term Goal 1 (Week 1): = to LTGs based on ELOS PT Short Term Goal 1 - Progress (Week 1): Progressing toward goal Week 2:  PT Short Term Goal 1 (Week 2): Pt will perform sit<>stand with min assist PT Short Term Goal 2 (Week 2): Pt will perform stand pivot transfers with min assist PT Short Term Goal 3 (Week 2): Pt will ambulate at least 64ft with CGA PT Short Term Goal 4 (Week 2): Pt will ascend/descend 4 steps using B HRs with min assist  Skilled Therapeutic Interventions/Progress Updates:     Patient in bed with her son in the room upon PT arrival. Patient alert and agreeable to PT session. Patient denied pain lying in bed at beginning of session, then reported 10/10 back pain during session with mobility, RN made aware and patient pre-medicated prior to session. PT provided repositioning, rest breaks, and distraction as pain interventions throughout session. Patient agreeable to initiating bed level exercises then attempting to sit EOB if tolerating exercises. Reported need for brief change due to bowl incontinence prior to exercises.   RN provided morning medications and lab drew patient's blood during session. Missed 10 min due to nursing/bedside blood draw. Will attempt to make-up missed time as able.  Therapeutic Activity: Bed Mobility: Patient performed rolling R/L with min A of 2, patient's son assisting for comfort. Decreased rolling to L due to pain. Required manual facilitation for LE management due to poor motor control and provided cues for log roll technique to maintain spinal precautions and for reduced back pain with mobility. Performed peri-care and changed incontinence brief with total A bed level.   Therapeutic  Exercise: Patient performed the following exercises for B LE with verbal and tactile cues for proper technique. Instructed patient's son on cues and assist technique to perform with patient 2-3x/day outside of therapy to tolerance.  -ankle pumps 2x10 -heel cord stretch 2x30 sec, educated on risk of gastroc tightness when patient is not performing OOB activity -hip/knee flexion extension 2x5 with AAROM -LAQ 2x5 with AAROM L>R  Patient in bed with heels floating (educated son on placement to reduce risk of pressure injury) with her son in the room at end of session with breaks locked, bed alarm set, and all needs within reach.    Therapy Documentation Precautions:  Precautions Precautions: Back, Fall Required Braces or Orthoses: Spinal Brace Spinal Brace: Thoracolumbosacral orthotic, Applied in sitting position, Other (comment) Spinal Brace Comments: TLSO when OOB Restrictions Weight Bearing Restrictions: Yes RLE Weight Bearing: Weight bearing as tolerated LLE Weight Bearing: Weight bearing as tolerated General: PT Amount of Missed Time (min): 10 Minutes PT Missed Treatment Reason: Nursing care;Other (Comment) (blood draw and morning meds)   Therapy/Group: Individual Therapy  Renny Remer L Stewart Sasaki PT, DPT  02/25/2020, 9:20 AM

## 2020-02-25 NOTE — Progress Notes (Signed)
Pt's son visiting in room most of the day. Pt to have surgery tomorrow, unknown time at this time. Consent signed, PCR swab completed, pt to be NPO after midnight. Pt and family aware. Heparin gtt to be discontinued at 1800. CBGs every 4hrs after midnight. Will report to oncoming shift for continuation of care.

## 2020-02-25 NOTE — Progress Notes (Signed)
Occupational Therapy Session Note  Patient Details  Name: Crystal Ashley MRN: [id number] Date of Birth: 01-02-1946  Today's Date: 02/25/2020 OT Individual Time: 8341-9622 OT Individual Time Calculation (min): 10 min  and Today's Date: 02/25/2020 OT Missed Time: 50 Minutes Missed Time Reason: Pain   Short Term Goals: Week 2:  OT Short Term Goal 1 (Week 2): patient will complete functional transfers with CG/min A using RW OT Short Term Goal 2 (Week 2): patient will tolerate adl w/c level with min A using ADs OT Short Term Goal 3 (Week 2): patient will complete bed mobility with min A OT Short Term Goal 4 (Week 2): Patient with donn TLSO with mod assist for two consecutive sessions.  Skilled Therapeutic Interventions/Progress Updates:    Upon entering the room, pt supine in bed with son present. Pt reports 10/10 back pain. OT offering to reposition in bed into side lying position but pt politely declines. Pt did participate in bed level therapies this morning but concerns over increasing pain further with this session. Pt's son with questions regarding pt belongings one she leaves floor for surgery tomorrow. All questions answered and all needs within reach. 50 missed minutes secondary to increase in pain this session.   Therapy Documentation Precautions:  Precautions Precautions: Back, Fall Required Braces or Orthoses: Spinal Brace Spinal Brace: Thoracolumbosacral orthotic, Applied in sitting position, Other (comment) Spinal Brace Comments: TLSO when OOB Restrictions Weight Bearing Restrictions: Yes RLE Weight Bearing: Weight bearing as tolerated LLE Weight Bearing: Weight bearing as tolerated General: General OT Amount of Missed Time: 50 Minutes ADL: ADL Eating: Supervision/safety, Set up Where Assessed-Eating: Bed level Grooming: Other (comment) (declined due to fatigue) Upper Body Bathing: Minimal assistance Where Assessed-Upper Body Bathing: Bed level Lower Body Bathing:  Dependent Where Assessed-Lower Body Bathing: Bed level Upper Body Dressing: Moderate assistance Where Assessed-Upper Body Dressing: Bed level Lower Body Dressing: Dependent Where Assessed-Lower Body Dressing: Bed level ADL Comments: patient declined OOB for OT eval due to pain and fatigue   Therapy/Group: Individual Therapy  Gypsy Decant 02/25/2020, 1:21 PM

## 2020-02-25 NOTE — Progress Notes (Signed)
ANTICOAGULATION CONSULT NOTE   Pharmacy Consult for heparin Indication: atrial fibrillation  No Known Allergies  Patient Measurements: Height: 5' (152.4 cm) Weight: 87.8 kg (193 lb 9 oz) IBW/kg (Calculated) : 45.5 Heparin Dosing Weight: 67 kg  Vital Signs: Temp: 97.6 F (36.4 C) (07/11 0510) BP: 144/78 (07/11 0808) Pulse Rate: 60 (07/11 0808)  Labs: Recent Labs    02/24/20 0001 02/24/20 1049 02/24/20 2046 02/25/20 0830 02/25/20 0842 02/25/20 1416  HGB 9.8*  --   --  10.9*  --   --   HCT 33.9*  --   --  35.8*  --   --   PLT 278  --   --  269  --   --   APTT 62*   < > 134*  --  74* 70*  HEPARINUNFRC 1.34*  --   --  0.41  --  0.39  CREATININE  --   --   --  0.85  --   --    < > = values in this interval not displayed.    Estimated Creatinine Clearance: 57.2 mL/min (by C-G formula based on SCr of 0.85 mg/dL).   Assessment: 74 yo W on rivaroxban PTA for afib (CHADS2VASc = 4) transitioned to heparin gtt for planned Neurosurgery on 7/12. Last dose of rivaroxaban given 7/8 at 17:50. Will start heparin around next rivaroxaban due time and follow aPTT until correlates with heparin level.  PTT still in range at 70, correlating with heparin level at 0.39. No bleeding issues per patient.   Goal of Therapy:  Heparin level 0.3-0.7 units/ml; PTT 66-102 sec Monitor platelets by anticoagulation protocol: Yes   Plan:  Continue heparin at current rate. Heparin to stop at 1800 per Dr. Vertell Limber (neurosurgery). Communicated with RN. Stop date has been added to order as well.  aPTTs and heparin levels have been correlating, so ok to use heparin level upon restart.   Rebbeca Paul, PharmD PGY1 Pharmacy Resident 02/25/2020 3:06 PM  Please check [email or web address] for unit-specific pharmacy phone numbers.

## 2020-02-26 ENCOUNTER — Inpatient Hospital Stay (HOSPITAL_COMMUNITY): Payer: Medicare Other

## 2020-02-26 ENCOUNTER — Inpatient Hospital Stay (HOSPITAL_COMMUNITY): Payer: Medicare Other | Admitting: Occupational Therapy

## 2020-02-26 ENCOUNTER — Encounter (HOSPITAL_COMMUNITY): Payer: Self-pay

## 2020-02-26 ENCOUNTER — Encounter (HOSPITAL_COMMUNITY)
Admission: AD | Disposition: A | Discharge status: Skilled Nursing Facility | Payer: Self-pay | Source: Home / Self Care | Attending: Neurosurgery

## 2020-02-26 ENCOUNTER — Inpatient Hospital Stay (HOSPITAL_COMMUNITY): Payer: Medicare Other | Admitting: Anesthesiology

## 2020-02-26 ENCOUNTER — Inpatient Hospital Stay (HOSPITAL_COMMUNITY)
Admission: AD | Admit: 2020-02-26 | Discharge: 2020-03-02 | DRG: 457 | Disposition: A | Discharge status: Skilled Nursing Facility | Payer: Medicare Other | Attending: Neurosurgery | Admitting: Neurosurgery

## 2020-02-26 ENCOUNTER — Inpatient Hospital Stay (HOSPITAL_COMMUNITY): Payer: Medicare Other | Admitting: Physical Therapy

## 2020-02-26 DIAGNOSIS — W19XXXA Unspecified fall, initial encounter: Secondary | ICD-10-CM | POA: Diagnosis present

## 2020-02-26 DIAGNOSIS — Z79899 Other long term (current) drug therapy: Secondary | ICD-10-CM | POA: Diagnosis not present

## 2020-02-26 DIAGNOSIS — E669 Obesity, unspecified: Secondary | ICD-10-CM | POA: Diagnosis present

## 2020-02-26 DIAGNOSIS — M2550 Pain in unspecified joint: Secondary | ICD-10-CM | POA: Diagnosis not present

## 2020-02-26 DIAGNOSIS — R262 Difficulty in walking, not elsewhere classified: Secondary | ICD-10-CM | POA: Diagnosis not present

## 2020-02-26 DIAGNOSIS — I152 Hypertension secondary to endocrine disorders: Secondary | ICD-10-CM | POA: Diagnosis present

## 2020-02-26 DIAGNOSIS — S22078A Other fracture of T9-T10 vertebra, initial encounter for closed fracture: Secondary | ICD-10-CM | POA: Diagnosis not present

## 2020-02-26 DIAGNOSIS — Z4789 Encounter for other orthopedic aftercare: Secondary | ICD-10-CM | POA: Diagnosis not present

## 2020-02-26 DIAGNOSIS — I480 Paroxysmal atrial fibrillation: Secondary | ICD-10-CM | POA: Diagnosis not present

## 2020-02-26 DIAGNOSIS — Z419 Encounter for procedure for purposes other than remedying health state, unspecified: Secondary | ICD-10-CM

## 2020-02-26 DIAGNOSIS — R001 Bradycardia, unspecified: Secondary | ICD-10-CM | POA: Diagnosis not present

## 2020-02-26 DIAGNOSIS — S22088A Other fracture of T11-T12 vertebra, initial encounter for closed fracture: Secondary | ICD-10-CM | POA: Diagnosis not present

## 2020-02-26 DIAGNOSIS — E1165 Type 2 diabetes mellitus with hyperglycemia: Secondary | ICD-10-CM | POA: Diagnosis present

## 2020-02-26 DIAGNOSIS — I5042 Chronic combined systolic (congestive) and diastolic (congestive) heart failure: Secondary | ICD-10-CM | POA: Diagnosis not present

## 2020-02-26 DIAGNOSIS — Z7401 Bed confinement status: Secondary | ICD-10-CM | POA: Diagnosis not present

## 2020-02-26 DIAGNOSIS — M4325 Fusion of spine, thoracolumbar region: Secondary | ICD-10-CM | POA: Diagnosis not present

## 2020-02-26 DIAGNOSIS — R0689 Other abnormalities of breathing: Secondary | ICD-10-CM | POA: Diagnosis not present

## 2020-02-26 DIAGNOSIS — M4804 Spinal stenosis, thoracic region: Secondary | ICD-10-CM | POA: Diagnosis not present

## 2020-02-26 DIAGNOSIS — Z751 Person awaiting admission to adequate facility elsewhere: Secondary | ICD-10-CM | POA: Diagnosis not present

## 2020-02-26 DIAGNOSIS — Z8542 Personal history of malignant neoplasm of other parts of uterus: Secondary | ICD-10-CM | POA: Diagnosis not present

## 2020-02-26 DIAGNOSIS — Z6837 Body mass index (BMI) 37.0-37.9, adult: Secondary | ICD-10-CM | POA: Diagnosis not present

## 2020-02-26 DIAGNOSIS — M8008XA Age-related osteoporosis with current pathological fracture, vertebra(e), initial encounter for fracture: Secondary | ICD-10-CM | POA: Diagnosis present

## 2020-02-26 DIAGNOSIS — E1169 Type 2 diabetes mellitus with other specified complication: Secondary | ICD-10-CM | POA: Diagnosis present

## 2020-02-26 DIAGNOSIS — R0602 Shortness of breath: Secondary | ICD-10-CM

## 2020-02-26 DIAGNOSIS — Z79890 Hormone replacement therapy (postmenopausal): Secondary | ICD-10-CM | POA: Diagnosis not present

## 2020-02-26 DIAGNOSIS — E039 Hypothyroidism, unspecified: Secondary | ICD-10-CM | POA: Diagnosis present

## 2020-02-26 DIAGNOSIS — I469 Cardiac arrest, cause unspecified: Secondary | ICD-10-CM | POA: Diagnosis not present

## 2020-02-26 DIAGNOSIS — E11649 Type 2 diabetes mellitus with hypoglycemia without coma: Secondary | ICD-10-CM | POA: Diagnosis not present

## 2020-02-26 DIAGNOSIS — J90 Pleural effusion, not elsewhere classified: Secondary | ICD-10-CM | POA: Diagnosis not present

## 2020-02-26 DIAGNOSIS — R41841 Cognitive communication deficit: Secondary | ICD-10-CM | POA: Diagnosis not present

## 2020-02-26 DIAGNOSIS — I959 Hypotension, unspecified: Secondary | ICD-10-CM | POA: Diagnosis not present

## 2020-02-26 DIAGNOSIS — E785 Hyperlipidemia, unspecified: Secondary | ICD-10-CM | POA: Diagnosis present

## 2020-02-26 DIAGNOSIS — I4891 Unspecified atrial fibrillation: Secondary | ICD-10-CM | POA: Diagnosis not present

## 2020-02-26 DIAGNOSIS — Z20822 Contact with and (suspected) exposure to covid-19: Secondary | ICD-10-CM | POA: Diagnosis not present

## 2020-02-26 DIAGNOSIS — I110 Hypertensive heart disease with heart failure: Secondary | ICD-10-CM | POA: Diagnosis not present

## 2020-02-26 DIAGNOSIS — I509 Heart failure, unspecified: Secondary | ICD-10-CM | POA: Diagnosis not present

## 2020-02-26 DIAGNOSIS — R1312 Dysphagia, oropharyngeal phase: Secondary | ICD-10-CM | POA: Diagnosis not present

## 2020-02-26 DIAGNOSIS — I10 Essential (primary) hypertension: Secondary | ICD-10-CM | POA: Diagnosis not present

## 2020-02-26 DIAGNOSIS — M6281 Muscle weakness (generalized): Secondary | ICD-10-CM | POA: Diagnosis not present

## 2020-02-26 DIAGNOSIS — Z794 Long term (current) use of insulin: Secondary | ICD-10-CM | POA: Diagnosis not present

## 2020-02-26 DIAGNOSIS — I4819 Other persistent atrial fibrillation: Secondary | ICD-10-CM | POA: Diagnosis not present

## 2020-02-26 HISTORY — PX: LUMBAR PERCUTANEOUS PEDICLE SCREW 4 LEVEL: SHX6318

## 2020-02-26 HISTORY — PX: APPLICATION OF ROBOTIC ASSISTANCE FOR SPINAL PROCEDURE: SHX6753

## 2020-02-26 LAB — POCT I-STAT 7, (LYTES, BLD GAS, ICA,H+H)
Acid-Base Excess: 12 mmol/L — ABNORMAL HIGH (ref 0.0–2.0)
Bicarbonate: 36.2 mmol/L — ABNORMAL HIGH (ref 20.0–28.0)
Calcium, Ion: 1.09 mmol/L — ABNORMAL LOW (ref 1.15–1.40)
HCT: 30 % — ABNORMAL LOW (ref 36.0–46.0)
Hemoglobin: 10.2 g/dL — ABNORMAL LOW (ref 12.0–15.0)
O2 Saturation: 100 %
Patient temperature: 36
Potassium: 3.3 mmol/L — ABNORMAL LOW (ref 3.5–5.1)
Sodium: 139 mmol/L (ref 135–145)
TCO2: 37 mmol/L — ABNORMAL HIGH (ref 22–32)
pCO2 arterial: 40.9 mmHg (ref 32.0–48.0)
pH, Arterial: 7.552 — ABNORMAL HIGH (ref 7.350–7.450)
pO2, Arterial: 278 mmHg — ABNORMAL HIGH (ref 83.0–108.0)

## 2020-02-26 LAB — GLUCOSE, CAPILLARY
Glucose-Capillary: 131 mg/dL — ABNORMAL HIGH (ref 70–99)
Glucose-Capillary: 163 mg/dL — ABNORMAL HIGH (ref 70–99)
Glucose-Capillary: 224 mg/dL — ABNORMAL HIGH (ref 70–99)
Glucose-Capillary: 243 mg/dL — ABNORMAL HIGH (ref 70–99)

## 2020-02-26 IMAGING — RF DG C-ARM 1-60 MIN
1 series · 2 of 2 positions shown · non-contrast
Comparison: CT thoracic spine dated [DATE].

CLINICAL DATA: T8-T12 fusion.

EXAM:
THORACOLUMBAR SPINE 1V

[Series 1: run · 2 of 2 slices shown]
[im 1/2]
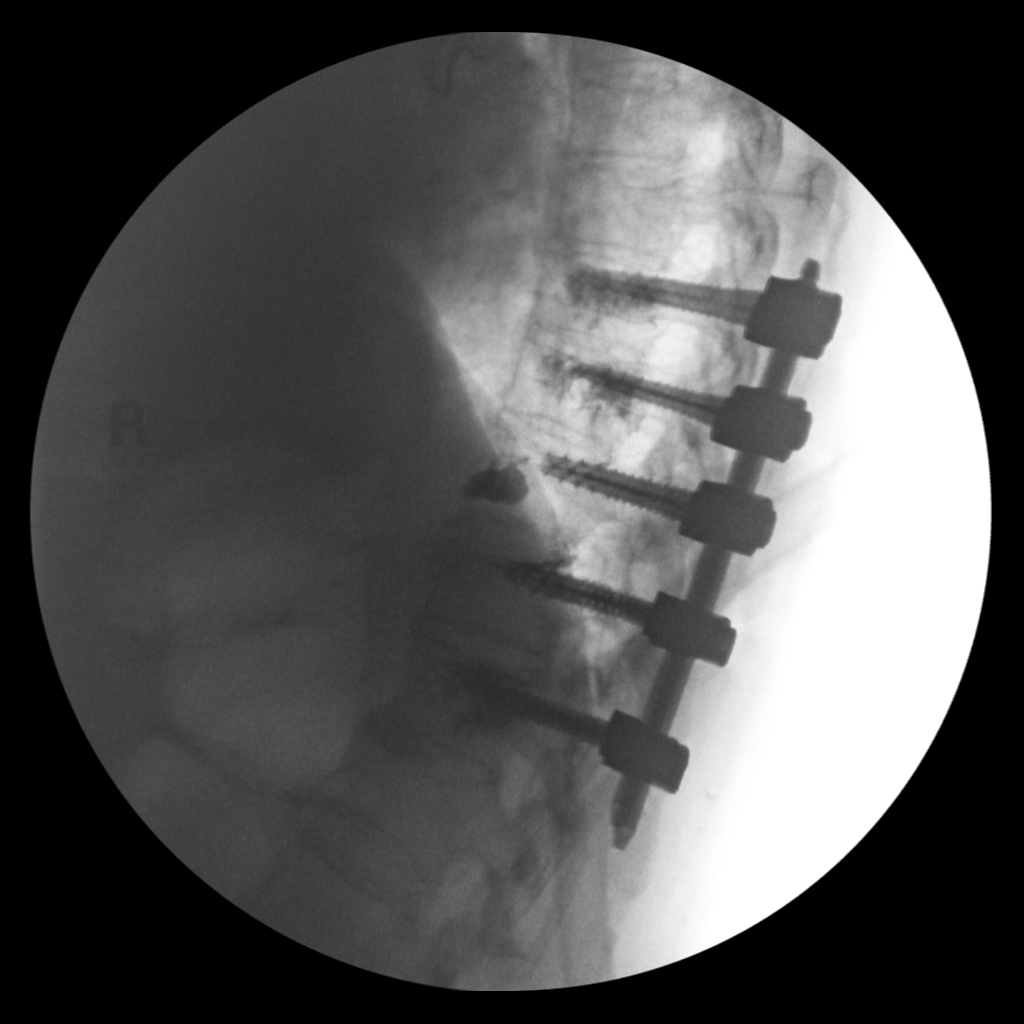
[im 2/2]
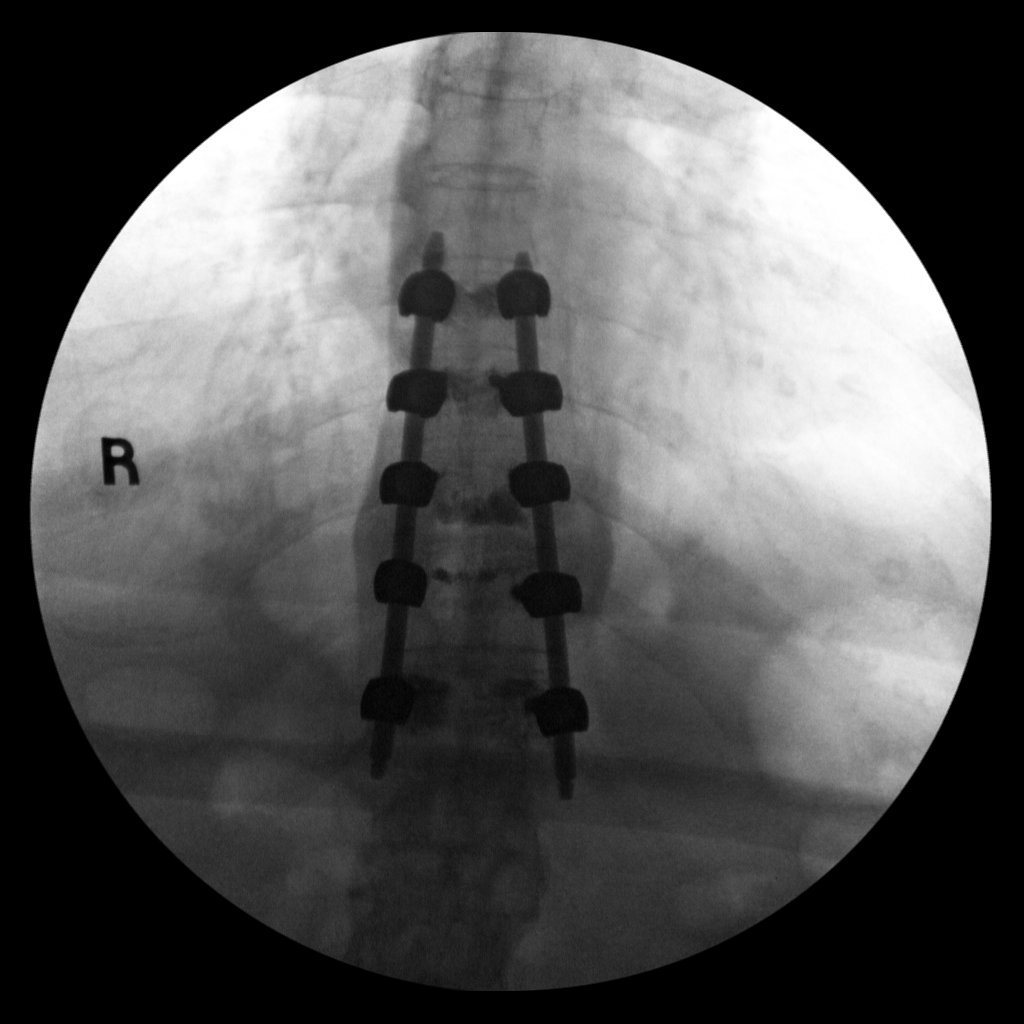

[2 of 2 positions shown; findings below may reference images not displayed]

FLUOROSCOPY TIME:  2 minutes, 59 seconds.

C-arm fluoroscopic images were obtained intraoperatively and
submitted for post operative interpretation.
FINDINGS: Frontal and lateral intraoperative fluoroscopic images demonstrate
interval T8-T12 posterior fusion. No evidence of hardware failure or
loosening. Small amount of cement in the T10 vertebral body and
around the T8, T9, and T12 pedicle screws. Known T10-T11 fractures
are better evaluated on recent CT.
IMPRESSION: 1. Intraoperative fluoroscopic guidance for T8-T12 posterior fusion.

## 2020-02-26 IMAGING — RF DG THORACOLUMBAR SPINE 2V
1 series · 2 of 2 positions shown · non-contrast
Comparison: CT thoracic spine dated [DATE].

CLINICAL DATA: T8-T12 fusion.

EXAM:
THORACOLUMBAR SPINE 1V

[Series 1: run · 2 of 2 slices shown]
[im 1/2]
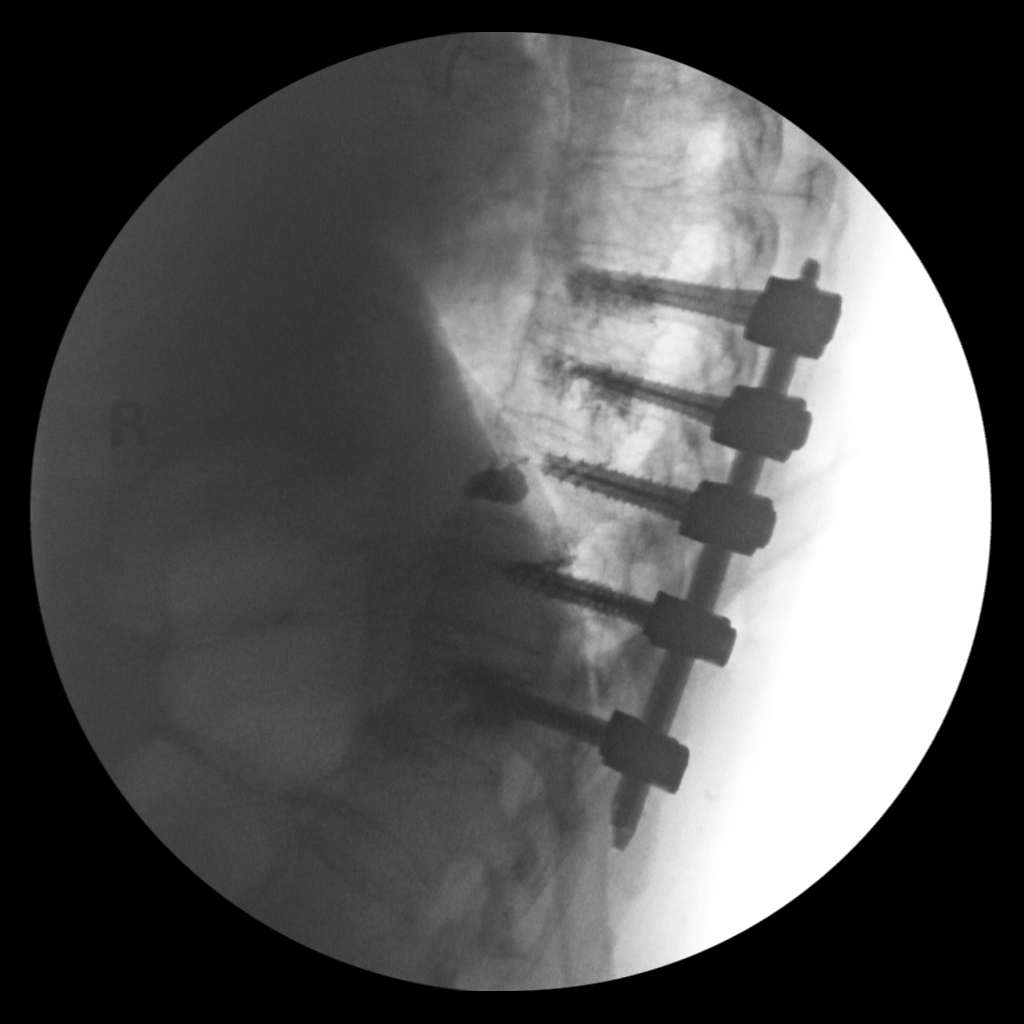
[im 2/2]
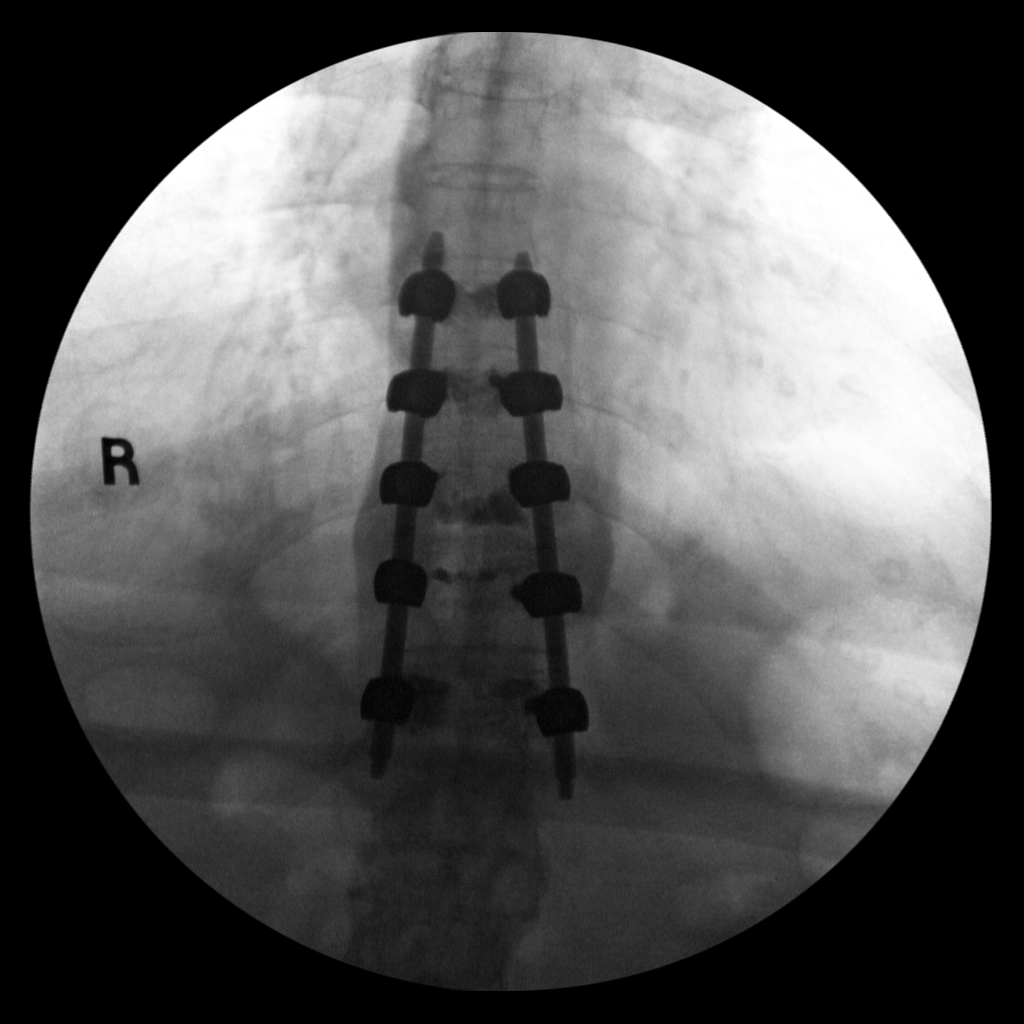

[2 of 2 positions shown; findings below may reference images not displayed]

FLUOROSCOPY TIME:  2 minutes, 59 seconds.

C-arm fluoroscopic images were obtained intraoperatively and
submitted for post operative interpretation.
FINDINGS: Frontal and lateral intraoperative fluoroscopic images demonstrate
interval T8-T12 posterior fusion. No evidence of hardware failure or
loosening. Small amount of cement in the T10 vertebral body and
around the T8, T9, and T12 pedicle screws. Known T10-T11 fractures
are better evaluated on recent CT.
IMPRESSION: 1. Intraoperative fluoroscopic guidance for T8-T12 posterior fusion.

## 2020-02-26 SURGERY — LUMBAR PERCUTANEOUS PEDICLE SCREW 4 LEVEL
Anesthesia: General | Site: Back

## 2020-02-26 MED ORDER — FENTANYL CITRATE (PF) 250 MCG/5ML IJ SOLN
INTRAMUSCULAR | Status: AC
  Filled 2020-02-26: qty 5

## 2020-02-26 MED ORDER — MENTHOL 3 MG MT LOZG: 1.0000 | LOZENGE | OROMUCOSAL | Status: DC | PRN

## 2020-02-26 MED ORDER — BUPIVACAINE LIPOSOME 1.3 % IJ SUSP
INTRAMUSCULAR | Status: DC | PRN
  Administered 2020-02-26: 20 mL

## 2020-02-26 MED ORDER — POLYETHYLENE GLYCOL 3350 17 G PO PACK: 17.0000 g | PACK | Freq: Every day | ORAL | Status: DC | PRN

## 2020-02-26 MED ORDER — POTASSIUM CHLORIDE IN NACL 20-0.9 MEQ/L-% IV SOLN
INTRAVENOUS | Status: DC
  Filled 2020-02-26 (×5): qty 1000

## 2020-02-26 MED ORDER — FENTANYL CITRATE (PF) 100 MCG/2ML IJ SOLN: 25.0000 ug | INTRAMUSCULAR | Status: DC | PRN

## 2020-02-26 MED ORDER — SODIUM CHLORIDE 0.9% FLUSH
3.0000 mL | Freq: Two times a day (BID) | INTRAVENOUS | Status: DC
  Administered 2020-02-26 – 2020-03-02 (×10): 3 mL via INTRAVENOUS

## 2020-02-26 MED ORDER — SODIUM CHLORIDE 0.9 % IV SOLN: 250.0000 mL | INTRAVENOUS | Status: DC

## 2020-02-26 MED ORDER — ACETAMINOPHEN 500 MG PO TABS: 1000.0000 mg | ORAL_TABLET | Freq: Once | ORAL | Status: DC

## 2020-02-26 MED ORDER — ENOXAPARIN SODIUM 40 MG/0.4ML ~~LOC~~ SOLN
40.0000 mg | SUBCUTANEOUS | Status: DC
  Administered 2020-02-27 – 2020-03-02 (×5): 40 mg via SUBCUTANEOUS
  Filled 2020-02-26 (×5): qty 0.4

## 2020-02-26 MED ORDER — DORZOLAMIDE HCL-TIMOLOL MAL 2-0.5 % OP SOLN
1.0000 [drp] | Freq: Two times a day (BID) | OPHTHALMIC | Status: DC
  Administered 2020-02-26 – 2020-03-02 (×10): 1 [drp] via OPHTHALMIC
  Filled 2020-02-26: qty 10

## 2020-02-26 MED ORDER — SODIUM CHLORIDE 0.9% FLUSH: 3.0000 mL | INTRAVENOUS | Status: DC | PRN

## 2020-02-26 MED ORDER — ONDANSETRON HCL 4 MG/2ML IJ SOLN
INTRAMUSCULAR | Status: DC | PRN
  Administered 2020-02-26: 4 mg via INTRAVENOUS

## 2020-02-26 MED ORDER — DEXAMETHASONE SODIUM PHOSPHATE 10 MG/ML IJ SOLN
INTRAMUSCULAR | Status: AC
  Filled 2020-02-26: qty 1

## 2020-02-26 MED ORDER — BUPIVACAINE HCL (PF) 0.5 % IJ SOLN
INTRAMUSCULAR | Status: DC | PRN
  Administered 2020-02-26: 8 mL

## 2020-02-26 MED ORDER — THROMBIN 5000 UNITS EX SOLR
CUTANEOUS | Status: AC
  Filled 2020-02-26: qty 5000

## 2020-02-26 MED ORDER — LIDOCAINE 2% (20 MG/ML) 5 ML SYRINGE
INTRAMUSCULAR | Status: DC | PRN
  Administered 2020-02-26: 80 mg via INTRAVENOUS

## 2020-02-26 MED ORDER — METHOCARBAMOL 1000 MG/10ML IJ SOLN
500.0000 mg | Freq: Four times a day (QID) | INTRAVENOUS | Status: DC | PRN
  Filled 2020-02-26: qty 5

## 2020-02-26 MED ORDER — ROCURONIUM BROMIDE 10 MG/ML (PF) SYRINGE
PREFILLED_SYRINGE | INTRAVENOUS | Status: AC
  Filled 2020-02-26: qty 20

## 2020-02-26 MED ORDER — VITAMIN D 25 MCG (1000 UNIT) PO TABS
5000.0000 [IU] | ORAL_TABLET | ORAL | Status: DC
  Administered 2020-02-26 – 2020-02-28 (×2): 5000 [IU] via ORAL
  Filled 2020-02-26 (×2): qty 5

## 2020-02-26 MED ORDER — PROPOFOL 10 MG/ML IV BOLUS
INTRAVENOUS | Status: AC
  Filled 2020-02-26: qty 20

## 2020-02-26 MED ORDER — PHENYLEPHRINE HCL-NACL 10-0.9 MG/250ML-% IV SOLN
INTRAVENOUS | Status: DC | PRN
  Administered 2020-02-26: 25 ug/min via INTRAVENOUS

## 2020-02-26 MED ORDER — FUROSEMIDE 40 MG PO TABS
40.0000 mg | ORAL_TABLET | Freq: Every day | ORAL | Status: DC
  Administered 2020-02-26 – 2020-03-02 (×6): 40 mg via ORAL
  Filled 2020-02-26 (×6): qty 1

## 2020-02-26 MED ORDER — FLEET ENEMA 7-19 GM/118ML RE ENEM: 1.0000 | ENEMA | Freq: Once | RECTAL | Status: DC | PRN

## 2020-02-26 MED ORDER — METOPROLOL SUCCINATE ER 100 MG PO TB24
100.0000 mg | ORAL_TABLET | Freq: Every day | ORAL | Status: DC
  Administered 2020-02-26 – 2020-03-02 (×6): 100 mg via ORAL
  Filled 2020-02-26 (×7): qty 1

## 2020-02-26 MED ORDER — LACTATED RINGERS IV SOLN
INTRAVENOUS | Status: DC | PRN
Start: 2020-02-26 — End: 2020-02-26

## 2020-02-26 MED ORDER — ACETAMINOPHEN 325 MG PO TABS
650.0000 mg | ORAL_TABLET | ORAL | Status: DC | PRN
  Administered 2020-02-27 – 2020-02-29 (×4): 650 mg via ORAL
  Filled 2020-02-26 (×4): qty 2

## 2020-02-26 MED ORDER — ONDANSETRON HCL 4 MG PO TABS: 4.0000 mg | ORAL_TABLET | Freq: Four times a day (QID) | ORAL | Status: DC | PRN

## 2020-02-26 MED ORDER — THROMBIN 5000 UNITS EX SOLR
OROMUCOSAL | Status: DC | PRN
  Administered 2020-02-26: 5 mL via TOPICAL

## 2020-02-26 MED ORDER — ACETAMINOPHEN 650 MG RE SUPP: 650.0000 mg | RECTAL | Status: DC | PRN

## 2020-02-26 MED ORDER — ONDANSETRON HCL 4 MG/2ML IJ SOLN: 4.0000 mg | Freq: Four times a day (QID) | INTRAMUSCULAR | Status: DC | PRN

## 2020-02-26 MED ORDER — PHENOL 1.4 % MT LIQD: 1.0000 | OROMUCOSAL | Status: DC | PRN

## 2020-02-26 MED ORDER — BUPIVACAINE HCL (PF) 0.5 % IJ SOLN
INTRAMUSCULAR | Status: AC
  Filled 2020-02-26: qty 30

## 2020-02-26 MED ORDER — CEFAZOLIN SODIUM-DEXTROSE 2-4 GM/100ML-% IV SOLN: 2.0000 g | INTRAVENOUS | Status: DC

## 2020-02-26 MED ORDER — DILTIAZEM HCL ER COATED BEADS 180 MG PO CP24
180.0000 mg | ORAL_CAPSULE | Freq: Every day | ORAL | Status: DC
  Administered 2020-02-26 – 2020-02-28 (×3): 180 mg via ORAL
  Filled 2020-02-26 (×3): qty 1

## 2020-02-26 MED ORDER — ACETAMINOPHEN 500 MG PO TABS: 1000.0000 mg | ORAL_TABLET | Freq: Once | ORAL | Status: AC

## 2020-02-26 MED ORDER — BUPIVACAINE LIPOSOME 1.3 % IJ SUSP
20.0000 mL | Freq: Once | INTRAMUSCULAR | Status: DC
  Filled 2020-02-26: qty 20

## 2020-02-26 MED ORDER — SUGAMMADEX SODIUM 200 MG/2ML IV SOLN
INTRAVENOUS | Status: DC | PRN
  Administered 2020-02-26: 200 mg via INTRAVENOUS

## 2020-02-26 MED ORDER — METFORMIN HCL 500 MG PO TABS
1000.0000 mg | ORAL_TABLET | Freq: Every day | ORAL | Status: DC
  Administered 2020-02-27 – 2020-03-02 (×5): 1000 mg via ORAL
  Filled 2020-02-26 (×5): qty 2

## 2020-02-26 MED ORDER — CYANOCOBALAMIN 1000 MCG/ML IJ SOLN: 1000.0000 ug | INTRAMUSCULAR | Status: DC

## 2020-02-26 MED ORDER — CEFAZOLIN SODIUM-DEXTROSE 2-4 GM/100ML-% IV SOLN
2.0000 g | Freq: Three times a day (TID) | INTRAVENOUS | Status: AC
  Administered 2020-02-26 – 2020-02-27 (×2): 2 g via INTRAVENOUS
  Filled 2020-02-26 (×2): qty 100

## 2020-02-26 MED ORDER — METHYLPREDNISOLONE ACETATE 80 MG/ML IJ SUSP
INTRAMUSCULAR | Status: AC
  Filled 2020-02-26: qty 1

## 2020-02-26 MED ORDER — ENSURE ENLIVE PO LIQD
237.0000 mL | Freq: Two times a day (BID) | ORAL | Status: DC
  Administered 2020-02-27 – 2020-03-02 (×8): 237 mL via ORAL

## 2020-02-26 MED ORDER — FENTANYL CITRATE (PF) 250 MCG/5ML IJ SOLN
INTRAMUSCULAR | Status: DC | PRN
  Administered 2020-02-26 (×10): 50 ug via INTRAVENOUS

## 2020-02-26 MED ORDER — LIDOCAINE-EPINEPHRINE 1 %-1:100000 IJ SOLN
INTRAMUSCULAR | Status: DC | PRN
  Administered 2020-02-26: 8 mL
  Administered 2020-02-26: 10 mL

## 2020-02-26 MED ORDER — TRAVOPROST (BAK FREE) 0.004 % OP SOLN
1.0000 [drp] | Freq: Every day | OPHTHALMIC | Status: DC
  Administered 2020-02-26 – 2020-03-01 (×5): 1 [drp] via OPHTHALMIC
  Filled 2020-02-26: qty 2.5

## 2020-02-26 MED ORDER — INSULIN ASPART 100 UNIT/ML ~~LOC~~ SOLN
0.0000 [IU] | Freq: Three times a day (TID) | SUBCUTANEOUS | Status: DC
  Administered 2020-02-26: 5 [IU] via SUBCUTANEOUS
  Administered 2020-02-27: 3 [IU] via SUBCUTANEOUS
  Administered 2020-02-27 (×2): 2 [IU] via SUBCUTANEOUS
  Administered 2020-02-28: 5 [IU] via SUBCUTANEOUS
  Administered 2020-02-28: 8 [IU] via SUBCUTANEOUS
  Administered 2020-02-29 – 2020-03-01 (×4): 2 [IU] via SUBCUTANEOUS
  Administered 2020-03-01: 8 [IU] via SUBCUTANEOUS
  Administered 2020-03-02: 2 [IU] via SUBCUTANEOUS

## 2020-02-26 MED ORDER — THROMBIN 20000 UNITS EX SOLR
CUTANEOUS | Status: AC
  Filled 2020-02-26: qty 20000

## 2020-02-26 MED ORDER — OXYCODONE-ACETAMINOPHEN 5-325 MG PO TABS
1.0000 | ORAL_TABLET | Freq: Four times a day (QID) | ORAL | Status: DC | PRN
  Administered 2020-02-29: 2 via ORAL
  Filled 2020-02-26 (×2): qty 2

## 2020-02-26 MED ORDER — PROPOFOL 10 MG/ML IV BOLUS
INTRAVENOUS | Status: DC | PRN
  Administered 2020-02-26: 100 mg via INTRAVENOUS

## 2020-02-26 MED ORDER — ONDANSETRON HCL 4 MG/2ML IJ SOLN: 4.0000 mg | Freq: Once | INTRAMUSCULAR | Status: DC | PRN

## 2020-02-26 MED ORDER — CEFAZOLIN SODIUM-DEXTROSE 2-3 GM-%(50ML) IV SOLR
INTRAVENOUS | Status: DC | PRN
  Administered 2020-02-26: 2 g via INTRAVENOUS

## 2020-02-26 MED ORDER — LACTATED RINGERS IV SOLN: INTRAVENOUS | Status: DC | PRN

## 2020-02-26 MED ORDER — MIDAZOLAM HCL 2 MG/2ML IJ SOLN
INTRAMUSCULAR | Status: AC
  Filled 2020-02-26: qty 2

## 2020-02-26 MED ORDER — LIDOCAINE 2% (20 MG/ML) 5 ML SYRINGE
INTRAMUSCULAR | Status: AC
  Filled 2020-02-26: qty 10

## 2020-02-26 MED ORDER — PHENYLEPHRINE 40 MCG/ML (10ML) SYRINGE FOR IV PUSH (FOR BLOOD PRESSURE SUPPORT)
PREFILLED_SYRINGE | INTRAVENOUS | Status: DC | PRN
  Administered 2020-02-26: 80 ug via INTRAVENOUS
  Administered 2020-02-26 (×2): 120 ug via INTRAVENOUS
  Administered 2020-02-26: 80 ug via INTRAVENOUS

## 2020-02-26 MED ORDER — ARTIFICIAL TEARS OPHTHALMIC OINT
TOPICAL_OINTMENT | OPHTHALMIC | Status: AC
  Filled 2020-02-26: qty 3.5

## 2020-02-26 MED ORDER — ONDANSETRON HCL 4 MG/2ML IJ SOLN
INTRAMUSCULAR | Status: AC
  Filled 2020-02-26: qty 2

## 2020-02-26 MED ORDER — LIDOCAINE-EPINEPHRINE 1 %-1:100000 IJ SOLN
INTRAMUSCULAR | Status: AC
  Filled 2020-02-26: qty 1

## 2020-02-26 MED ORDER — METHOCARBAMOL 500 MG PO TABS
500.0000 mg | ORAL_TABLET | Freq: Four times a day (QID) | ORAL | Status: DC | PRN
  Administered 2020-02-27 – 2020-02-29 (×3): 500 mg via ORAL
  Filled 2020-02-26 (×3): qty 1

## 2020-02-26 MED ORDER — HYDROMORPHONE HCL 1 MG/ML IJ SOLN: 0.5000 mg | INTRAMUSCULAR | Status: DC | PRN

## 2020-02-26 MED ORDER — ROCURONIUM BROMIDE 10 MG/ML (PF) SYRINGE
PREFILLED_SYRINGE | INTRAVENOUS | Status: DC | PRN
  Administered 2020-02-26 (×2): 20 mg via INTRAVENOUS
  Administered 2020-02-26: 60 mg via INTRAVENOUS

## 2020-02-26 MED ORDER — DEXAMETHASONE SODIUM PHOSPHATE 10 MG/ML IJ SOLN
INTRAMUSCULAR | Status: DC | PRN
  Administered 2020-02-26: 4 mg via INTRAVENOUS

## 2020-02-26 MED ORDER — ACETAMINOPHEN 500 MG PO TABS
ORAL_TABLET | ORAL | Status: AC
  Administered 2020-02-26: 1000 mg via ORAL
  Filled 2020-02-26: qty 2

## 2020-02-26 MED ORDER — 0.9 % SODIUM CHLORIDE (POUR BTL) OPTIME
TOPICAL | Status: DC | PRN
  Administered 2020-02-26 (×2): 1000 mL

## 2020-02-26 MED ORDER — SODIUM CHLORIDE 0.9 % IV SOLN
INTRAVENOUS | Status: DC | PRN
  Administered 2020-02-26: 500 mL

## 2020-02-26 MED ORDER — ALBUMIN HUMAN 5 % IV SOLN: INTRAVENOUS | Status: DC | PRN

## 2020-02-26 MED ORDER — INSULIN ASPART 100 UNIT/ML ~~LOC~~ SOLN
0.0000 [IU] | Freq: Every day | SUBCUTANEOUS | Status: DC
  Administered 2020-02-26 – 2020-02-27 (×2): 2 [IU] via SUBCUTANEOUS

## 2020-02-26 MED ORDER — LEVOTHYROXINE SODIUM 75 MCG PO TABS
75.0000 ug | ORAL_TABLET | Freq: Every day | ORAL | Status: DC
  Administered 2020-02-27 – 2020-03-02 (×5): 75 ug via ORAL
  Filled 2020-02-26 (×5): qty 1

## 2020-02-26 MED ORDER — PHENYLEPHRINE 40 MCG/ML (10ML) SYRINGE FOR IV PUSH (FOR BLOOD PRESSURE SUPPORT)
PREFILLED_SYRINGE | INTRAVENOUS | Status: AC
  Filled 2020-02-26: qty 10

## 2020-02-26 MED ORDER — PANTOPRAZOLE SODIUM 40 MG PO TBEC
40.0000 mg | DELAYED_RELEASE_TABLET | Freq: Every day | ORAL | Status: DC
  Administered 2020-02-26 – 2020-03-02 (×6): 40 mg via ORAL
  Filled 2020-02-26 (×6): qty 1

## 2020-02-26 MED ORDER — ATORVASTATIN CALCIUM 10 MG PO TABS
10.0000 mg | ORAL_TABLET | Freq: Every day | ORAL | Status: DC
  Administered 2020-02-26 – 2020-03-02 (×6): 10 mg via ORAL
  Filled 2020-02-26 (×6): qty 1

## 2020-02-26 SURGICAL SUPPLY — 99 items
BAG DECANTER FOR FLEXI CONT (MISCELLANEOUS) ×3 IMPLANT
BENZOIN TINCTURE PRP APPL 2/3 (GAUZE/BANDAGES/DRESSINGS) IMPLANT
BIT DRILL LONG 3.0X30 (BIT) IMPLANT
BIT DRILL LONG 3.0X30MM (BIT)
BIT DRILL LONG 3X80 (BIT) IMPLANT
BIT DRILL LONG 3X80MM (BIT)
BIT DRILL LONG 4X80 (BIT) IMPLANT
BIT DRILL LONG 4X80MM (BIT)
BIT DRILL SHORT 3.0X30 (BIT) ×2 IMPLANT
BIT DRILL SHORT 3.0X30MM (BIT) ×1
BIT DRILL SHORT 3X80 (BIT) IMPLANT
BIT DRILL SHORT 3X80MM (BIT)
BLADE CLIPPER SURG (BLADE) IMPLANT
BLADE SURG 11 STRL SS (BLADE) ×3 IMPLANT
BUR MATCHSTICK NEURO 3.0 LAGG (BURR) ×3 IMPLANT
CANISTER SUCT 3000ML PPV (MISCELLANEOUS) ×3 IMPLANT
CARTRIDGE OIL MAESTRO DRILL (MISCELLANEOUS) IMPLANT
CATH FOLEY 2WAY SLVR  5CC 14FR (CATHETERS)
CATH FOLEY 2WAY SLVR 5CC 14FR (CATHETERS) IMPLANT
CEMENT BONE KYPHX HV R (Orthopedic Implant) ×3 IMPLANT
CEMENT KYPHON C01A KIT/MIXER (Cement) ×3 IMPLANT
CLOSURE WOUND 1/2 X4 (GAUZE/BANDAGES/DRESSINGS)
CNTNR URN SCR LID CUP LEK RST (MISCELLANEOUS) ×1 IMPLANT
CONT SPEC 4OZ STRL OR WHT (MISCELLANEOUS) ×2
COVER BACK TABLE 24X17X13 BIG (DRAPES) IMPLANT
COVER BACK TABLE 60X90IN (DRAPES) ×3 IMPLANT
COVER WAND RF STERILE (DRAPES) ×3 IMPLANT
DECANTER SPIKE VIAL GLASS SM (MISCELLANEOUS) ×3 IMPLANT
DERMABOND ADVANCED (GAUZE/BANDAGES/DRESSINGS) ×2
DERMABOND ADVANCED .7 DNX12 (GAUZE/BANDAGES/DRESSINGS) ×1 IMPLANT
DEVICE BONE FILLER 6550102 2PK (MISCELLANEOUS) ×15 IMPLANT
DIFFUSER DRILL AIR PNEUMATIC (MISCELLANEOUS) IMPLANT
DRAPE C-ARM 42X72 X-RAY (DRAPES) ×3 IMPLANT
DRAPE C-ARMOR (DRAPES) ×3 IMPLANT
DRAPE LAPAROTOMY 100X72X124 (DRAPES) ×3 IMPLANT
DRAPE SHEET LG 3/4 BI-LAMINATE (DRAPES) ×3 IMPLANT
DRAPE SURG 17X23 STRL (DRAPES) ×3 IMPLANT
DRSG OPSITE POSTOP 4X8 (GAUZE/BANDAGES/DRESSINGS) ×6 IMPLANT
DURAPREP 26ML APPLICATOR (WOUND CARE) ×3 IMPLANT
ELECT REM PT RETURN 9FT ADLT (ELECTROSURGICAL) ×3
ELECTRODE REM PT RTRN 9FT ADLT (ELECTROSURGICAL) ×1 IMPLANT
EXTENDER TAB GUIDE SV 5.5/6.0 (INSTRUMENTS) ×60 IMPLANT
GAUZE 4X4 16PLY RFD (DISPOSABLE) IMPLANT
GAUZE SPONGE 4X4 12PLY STRL (GAUZE/BANDAGES/DRESSINGS) ×3 IMPLANT
GAUZE SPONGE 4X4 16PLY XRAY LF (GAUZE/BANDAGES/DRESSINGS) ×3 IMPLANT
GLOVE BIO SURGEON STRL SZ7 (GLOVE) IMPLANT
GLOVE BIO SURGEON STRL SZ7.5 (GLOVE) ×6 IMPLANT
GLOVE BIOGEL PI IND STRL 6.5 (GLOVE) ×2 IMPLANT
GLOVE BIOGEL PI IND STRL 7.0 (GLOVE) ×1 IMPLANT
GLOVE BIOGEL PI IND STRL 7.5 (GLOVE) ×2 IMPLANT
GLOVE BIOGEL PI INDICATOR 6.5 (GLOVE) ×4
GLOVE BIOGEL PI INDICATOR 7.0 (GLOVE) ×2
GLOVE BIOGEL PI INDICATOR 7.5 (GLOVE) ×4
GLOVE ECLIPSE 7.0 STRL STRAW (GLOVE) IMPLANT
GLOVE EXAM NITRILE XL STR (GLOVE) IMPLANT
GLOVE SURG SS PI 6.0 STRL IVOR (GLOVE) ×12 IMPLANT
GOWN STRL REUS W/ TWL LRG LVL3 (GOWN DISPOSABLE) ×2 IMPLANT
GOWN STRL REUS W/ TWL XL LVL3 (GOWN DISPOSABLE) IMPLANT
GOWN STRL REUS W/TWL 2XL LVL3 (GOWN DISPOSABLE) IMPLANT
GOWN STRL REUS W/TWL LRG LVL3 (GOWN DISPOSABLE) ×4
GOWN STRL REUS W/TWL XL LVL3 (GOWN DISPOSABLE)
GUIDEWIRE BLUNT NT 450 (WIRE) ×30 IMPLANT
HEMOSTAT POWDER KIT SURGIFOAM (HEMOSTASIS) ×3 IMPLANT
KIT BASIN OR (CUSTOM PROCEDURE TRAY) ×3 IMPLANT
KIT POSITION SURG JACKSON T1 (MISCELLANEOUS) ×3 IMPLANT
KIT SPINE MAZOR X ROBO DISP (MISCELLANEOUS) ×3 IMPLANT
KIT TURNOVER KIT B (KITS) ×3 IMPLANT
NEEDLE HYPO 18GX1.5 BLUNT FILL (NEEDLE) IMPLANT
NEEDLE HYPO 21X1.5 SAFETY (NEEDLE) ×3 IMPLANT
NEEDLE HYPO 25X1 1.5 SAFETY (NEEDLE) ×3 IMPLANT
NEEDLE SPNL 18GX3.5 QUINCKE PK (NEEDLE) IMPLANT
NS IRRIG 1000ML POUR BTL (IV SOLUTION) ×3 IMPLANT
OIL CARTRIDGE MAESTRO DRILL (MISCELLANEOUS)
PACK LAMINECTOMY NEURO (CUSTOM PROCEDURE TRAY) ×3 IMPLANT
PAD ARMBOARD 7.5X6 YLW CONV (MISCELLANEOUS) ×3 IMPLANT
PIN HEAD 2.5X60MM (PIN) IMPLANT
ROD PERC STRT 5.5X130 (Rod) ×6 IMPLANT
SCREW MA FENS 4.5X35 (Screw) ×12 IMPLANT
SCREW MA FENS 5.5X35 (Screw) ×18 IMPLANT
SCREW SCHANZ SA 4.0MM (MISCELLANEOUS) IMPLANT
SCREW SET 5.5/6.0MM SOLERA (Screw) ×30 IMPLANT
SPONGE LAP 4X18 RFD (DISPOSABLE) IMPLANT
SPONGE SURGIFOAM ABS GEL 100 (HEMOSTASIS) IMPLANT
STRIP CLOSURE SKIN 1/2X4 (GAUZE/BANDAGES/DRESSINGS) IMPLANT
SUT MNCRL AB 4-0 PS2 18 (SUTURE) ×9 IMPLANT
SUT VIC AB 0 CT1 18XCR BRD8 (SUTURE) ×2 IMPLANT
SUT VIC AB 0 CT1 8-18 (SUTURE) ×4
SUT VIC AB 2-0 CP2 18 (SUTURE) ×12 IMPLANT
SUT VIC AB 2-0 CT1 18 (SUTURE) IMPLANT
SUT VICRYL 3-0 RB1 18 ABS (SUTURE) IMPLANT
SYR 20ML LL LF (SYRINGE) ×3 IMPLANT
SYR 3ML LL SCALE MARK (SYRINGE) IMPLANT
TIP FENESTRATED 6550202 2PK (MISCELLANEOUS) ×15 IMPLANT
TOWEL GREEN STERILE (TOWEL DISPOSABLE) ×3 IMPLANT
TOWEL GREEN STERILE FF (TOWEL DISPOSABLE) ×3 IMPLANT
TRAY FOL W/BAG SLVR 16FR STRL (SET/KITS/TRAYS/PACK) ×1 IMPLANT
TRAY FOLEY W/BAG SLVR 16FR LF (SET/KITS/TRAYS/PACK) ×2
TUBE MAZOR SA REDUCTION (TUBING) ×3 IMPLANT
WATER STERILE IRR 1000ML POUR (IV SOLUTION) ×3 IMPLANT

## 2020-02-26 NOTE — Anesthesia Postprocedure Evaluation (Signed)
Anesthesia Post Note  Patient: Crystal Ashley  Procedure(s) Performed: Thoracic eight to thoracic twelve posterior percutaneous instrumentation with cement augmentation and bilateral medial facetectomy for decompression at Thoracic ten-eleven (N/A Back) APPLICATION OF ROBOTIC ASSISTANCE FOR SPINAL PROCEDURE (N/A Back)     Patient location during evaluation: PACU Anesthesia Type: General Level of consciousness: awake and alert and oriented Pain management: pain level controlled Vital Signs Assessment: post-procedure vital signs reviewed and stable Respiratory status: spontaneous breathing, nonlabored ventilation and respiratory function stable Cardiovascular status: blood pressure returned to baseline Postop Assessment: no apparent nausea or vomiting Anesthetic complications: no   No complications documented.  Last Vitals:  Vitals:   02/26/20 1500 02/26/20 1550  BP: (!) 150/95 130/72  Pulse: (!) 43 88  Resp: 16 10  Temp: (!) 36.4 C (!) 35.6 C  SpO2: 98% 98%    Last Pain:  Vitals:   02/26/20 1550  TempSrc: Axillary  PainSc:                  [name]

## 2020-02-26 NOTE — Anesthesia Procedure Notes (Signed)
Procedure Name: Intubation Date/Time: 02/26/2020 7:46 AM Performed by: Wilburn Cornelia, CRNA Pre-anesthesia Checklist: Emergency Drugs available, Patient identified, Suction available, Patient being monitored and Timeout performed Patient Re-evaluated:Patient Re-evaluated prior to induction Oxygen Delivery Method: Circle system utilized Preoxygenation: Pre-oxygenation with 100% oxygen Induction Type: IV induction Ventilation: Mask ventilation without difficulty Laryngoscope Size: Mac and 3 Grade View: Grade I Tube type: Oral Tube size: 7.0 mm Number of attempts: 1 Airway Equipment and Method: Stylet Placement Confirmation: ETT inserted through vocal cords under direct vision,  positive ETCO2,  CO2 detector and breath sounds checked- equal and bilateral Secured at: 21 cm Tube secured with: Tape Dental Injury: Teeth and Oropharynx as per pre-operative assessment

## 2020-02-26 NOTE — Plan of Care (Signed)

## 2020-02-26 NOTE — Op Note (Signed)
PREOP DIAGNOSIS: T10/T11 fractures POSTOP DIAGNOSIS: T10/T11 fractures  PROCEDURE: 1. Posterior arthrodesis T10-11 2. Open reduction of thoracic spine fracture  3. Segmental instrumentation with bilateral percutaneously placed pedicle screw/rod constructs T8-T12 with Mazor robot assistance. 4. Posterior laminotomies and medial facetectomies T10-11 bilaterally for decompression. 5. Harvest of local autograft  SURGEON: Dr. Duffy Rhody, MD  ASSISTANT: Emelda Brothers, MD. Please note, there were no qualified trainees available to assist with the procedure.  An assistant was required for aid in retraction of the neural elements.   ANESTHESIA: General Endotracheal  EBL: 50 ml  IMPLANTS:  Medtronic Voyager FNS 4.5 x 35 mm pedicle screws bilaterally at T8 and T9 and 5.5 x 35 mm screws bilaterally at T10,T11 and T12.  130 mm rods bilaterally.  Bone cement.  SPECIMENS: None  DRAINS: None  COMPLICATIONS: None  CONDITION: Stable to PACU  HISTORY: Crystal Ashley is a 74 y.o. 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  Her posterior ligamentous complex appeared intact and she had good alignment, and due to the patient's medical comorbidities, none surgical treatment was initially recommended and attempted.  However, in rehabilitation, patient had progressive mid back pain as well as weakness in her legs.  She was no longer able to ambulate, even with physical therapy.  Subsequent MRI and imaging was performed and showed slight worsening of the fracture as well as slight worsening of her degenerative stenosis at the fracture level.  I had a long discussion with the patient and with her son other family members.  Due to the patient's worsening clinical status, I recommended fixation of the fracture to help alleviate her pain.  As she had a fair amount of stenosis at this level and had  developed leg weakness as well, I also recommended decompression with the hopes of improving her leg strength.  Risks, benefits, alternatives, expected convalescence were enumerated and discussed in detail with the patient's family.  Risks discussed included but were not limited to bleeding, pain, infection, failure of fracture healing, pseudoarthrosis, neurologic deficit, cement extravasation and embolism, paralysis, and death.  There clearly there were risks with the surgery, given her poor ambulatory status and rapidly declining functional status, the alternative of no surgery would likely have resulted in patient's demise.  As such, they wish to proceed with surgery.  Informed consent was obtained.  PROCEDURE IN DETAIL: After informed consent was obtained and witnessed, the patient was brought to the operating room. After induction of general anesthesia, the patient was carefully positioned on the operative table in the prone position on a Jackson table with all pressure points meticulously padded. The skin of the back was then prepped and draped in the usual sterile fashion.  An incision was made over the T10-11 level is localized by x-ray.  The fascia was opened sharply.  The paraspinous muscles were dissected off of the T10 lamina as well as the superior T11 lamina in subperiosteal fashion.  There was large spinous process with calcified interspinous ligament spanning above and below T10.  A spinous process clamp was placed on this bulky spinous process and used to secure the Mazor robot to the patient.  The robot was registered to the patient using x-rays and a preoperative thin cut CT.  Pedicle screw trajectories were preplanned on the robot computer and used to localize paramedian incisions for T8, T9, and T10 screws, as well as T12 screws.  A  paramedian skin incision was made for the left T11 screw and the midline incision was used for the right T11 screw.  The robot was set to the appropriate  trajectories at each of the levels and a high-speed drill was used to cannulate the pedicle followed by placement of the K wires.  AP and lateral x-rays confirmed good placement in the pedicles bilaterally.  Wires were bent out of the way and self-retaining tractor was placed at T10-11 after removal of the spinous process clamp.  Bilateral laminotomies were performed to expose the thecal sac after removal of overgrown ligamentum flavum.  Medial facetectomies with removal of the hypertrophied superior articulating process of T11 was performed with high-speed drill as well as rongeurs.  Meticulous hemostasis was obtained.  With removal of the ligament and hypertrophied facets as well as with the laminotomies, the thecal sac was well decompressed, with easy passage of Woodson in the epidural space.  Posterolateral arthrodesis was performed.  Self-retaining retractor was then removed using C arm guidance, a cannulated tap was placed over the wires and used to tap the pedicles at T8-T9 T10,T11 and T12 bilaterally.  Pedicle screws were then placed over the K wire under C-arm guidance.  There was fairly good purchase at each of the levels with the exception of the right T11 screw.  The screw was explored and there appeared to be a small medial breach on direct inspection.  Therefore, the screw was removed and a new screw was placed with a more lateral entry point slightly in the more medial trajectory under C-arm guidance.  Ball ended feeler confirmed good placement in the pedicle and the pedicle screws placed.  C-arm x-ray confirmed appropriate positioning.  Cement towers were then locked into the screw towers, and under C-arm guidance, a small amount of cement , less than 1.5 mL, was placed in each of the cannulated screws under C-arm guidance.  At T10, where there is a large gaping fracture of the anterior body, cement was simply drifting into fracture space more anteriorly so a small amount of cement was used there.   Additionally at T11 on the right side, no cement was placed due to the presence of the old screw trajectory which was resulting in reflux.  C-arm x-rays confirmed good interpolation of the cement with the bodies the other levels the cement towers removed and 130 mm rods were passed through the talar heads and locked in place with screw caps after x-ray confirmed appropriate.  Tower hands were removed and the wounds were irrigated thoroughly with bacitracin impregnated irrigation.  The muscle layer was injected with Exparel.  The fascia was closed with 0 Vicryl stitches.  The dermal layers closed with 2-0 Vicryl stitches in buried interrupted fashion.  The skin was closed with 4 Monocryl followed by Dermabond and a sterile dressing.  Patient was then flipped supine with expectation of extubation by the anesthesia service.  All counts were correct at the end of the case.  No complications were noted.

## 2020-02-26 NOTE — Transfer of Care (Signed)
Immediate Anesthesia Transfer of Care Note  Patient: Octavia Roselyne Stalnaker  Procedure(s) Performed: Thoracic eight to thoracic twelve posterior percutaneous instrumentation with cement augmentation and bilateral medial facetectomy for decompression at Thoracic ten-eleven (N/A Back) APPLICATION OF ROBOTIC ASSISTANCE FOR SPINAL PROCEDURE (N/A Back)  Patient Location: PACU  Anesthesia Type:General  Level of Consciousness: awake and alert   Airway & Oxygen Therapy: Patient Spontanous Breathing and Patient connected to nasal cannula oxygen  Post-op Assessment: Report given to RN and Post -op Vital signs reviewed and stable  Post vital signs: Reviewed and stable  Last Vitals:  Vitals Value Taken Time  BP 146/73 02/26/20 1357  Temp    Pulse 107 02/26/20 1357  Resp 16 02/26/20 1357  SpO2 100 % 02/26/20 1357  Vitals shown include unvalidated device data.  Last Pain: There were no vitals filed for this visit.       Complications: No complications documented.

## 2020-02-26 NOTE — Progress Notes (Signed)
Neurosurgery Postop check  S: "I feel tired".  BP 130/72 (BP Location: Right Arm)    Pulse 88    Temp (!) 96.1 F (35.6 C) (Axillary)    Resp 10    SpO2 98%  Awake, NAD 3/5 L DF, 4/5 L PF 4/5 R DF, 4/5 R PF 1/2/5 KE, HF bilaterally  POD#0 from T8-12 posterior perc fusion, T10-11 decompression, doing well.  Strength stable from preop.  Hopefully will see improvements in upcoming days. - OOB with TLSO brace, PT/OT - nutritional supplement - lovenox tomorrow - can restart Xarelto 1 week postop

## 2020-02-26 NOTE — Discharge Summary (Signed)
Physician Discharge Summary  Patient ID: Antonio Woodhams MRN: [id number] DOB/AGE: [birth year] 74 y.o.  Admit date: 02/12/2020 Discharge date: 02/26/2020  Discharge Diagnoses:  Principal Problem:   Multiple traumatic injuries Active Problems:   Acute blood loss anemia   PAF (paroxysmal atrial fibrillation) ([place])   Controlled type 2 diabetes mellitus with hyperglycemia, without long-term current use of insulin ([facility])   Fracture of vertebra due to osteoporosis ([facility])   Acute midline thoracic back pain   Discharged Condition: stable   Significant Diagnostic Studies: CT HEAD WO CONTRAST  Result Date: 02/21/2020 CLINICAL DATA:  Change in mental status EXAM: CT HEAD WITHOUT CONTRAST TECHNIQUE: Contiguous axial images were obtained from the base of the skull through the vertex without intravenous contrast. COMPARISON:  01/31/2020 FINDINGS: Brain: There is no acute intracranial hemorrhage, mass effect, or edema. No new loss of gray-white differentiation. There is no extra-axial fluid collection. Prominence of the ventricles and sulci reflects stable parenchymal volume loss. Patchy hypoattenuation in the supratentorial white matter is nonspecific but probably reflects stable chronic microvascular ischemic changes Vascular: There is atherosclerotic calcification at the skull base. Skull: Calvarium is unremarkable. Sinuses/Orbits: Increased retained secretions within the left sphenoid sinus. No acute orbital abnormality. Other: None. IMPRESSION: No acute intracranial abnormality. Stable chronic findings detailed above. Electronically Signed   By: Macy Mis M.D.   On: 02/21/2020 16:31    CT THORACIC SPINE WO CONTRAST  Result Date: 02/22/2020 CLINICAL DATA:  Spinal fracture EXAM: CT THORACIC AND LUMBAR SPINE WITHOUT CONTRAST TECHNIQUE: Multidetector CT imaging of the thoracic and lumbar spine was performed without contrast. Multiplanar CT image reconstructions were also generated. COMPARISON:  Thoracic  spine MRI 02/21/2020 Thoracolumbar spine CT 02/06/2020 FINDINGS: CT THORACIC SPINE FINDINGS Alignment: Normal. Vertebrae: Fracture of T10 with anterior distraction that now measures 11 mm, previously 8 mm. Mild progression of height loss at T11. There are flowing anterior syndesmophytes along the length of thoracic spine. Paraspinal and other soft tissues: Bilateral pleural effusions. Cardiomegaly. Mild pulmonary edema. Disc levels: There is moderate spinal canal stenosis at the T10-11 level. There is also severe bilateral foraminal stenosis at this level. CT LUMBAR SPINE FINDINGS Segmentation: 5 lumbar type vertebrae. Alignment: Normal. Vertebrae: Chronic height loss at L1 of approximately 10% anteriorly. No retropulsion. Paraspinal and other soft tissues: Calcific aortic atherosclerosis. Disc levels: T12-L1: Mild spinal canal stenosis due to disc bulge and facet hypertrophy. L1-2: No spinal canal stenosis or neural impingement. L2-3: No spinal canal stenosis or neural impingement. L3-4: Mild spinal canal stenosis due to facet hypertrophy and disc bulge. L4-5: Moderate spinal canal stenosis due to disc bulge and facet hypertrophy. Mild right and severe left foraminal stenosis. L5-S1: Disc space narrowing mild spinal canal stenosis. Mild right and moderate left foraminal stenosis. IMPRESSION: 1. T10 fracture with anterior distraction that now measures 11 mm, previously 8 mm. 2. Mild progression of height loss of the T11 compression fracture. 3. 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 4. Severe left foraminal stenosis at L4-5 due to disc bulge and facet hypertrophy. 5. Chronic height loss at L1 of approximately 10% anteriorly. 6. Bilateral pleural effusions and mild pulmonary edema. 7. Flowing anterior syndesmophytes along the length of the thoracic spine). 8. Aortic Atherosclerosis (ICD10-I70.0). Electronically Signed   By: Ulyses Jarred M.D.   On: 02/22/2020 21:03   CT  LUMBAR SPINE WO CONTRAST  Result Date: 02/22/2020 CLINICAL DATA:  Spinal fracture EXAM: CT THORACIC AND LUMBAR SPINE WITHOUT CONTRAST TECHNIQUE: Multidetector CT  imaging of the thoracic and lumbar spine was performed without contrast. Multiplanar CT image reconstructions were also generated. COMPARISON:  Thoracic spine MRI 02/21/2020 Thoracolumbar spine CT 02/06/2020 FINDINGS: CT THORACIC SPINE FINDINGS Alignment: Normal. Vertebrae: Fracture of T10 with anterior distraction that now measures 11 mm, previously 8 mm. Mild progression of height loss at T11. There are flowing anterior syndesmophytes along the length of thoracic spine. Paraspinal and other soft tissues: Bilateral pleural effusions. Cardiomegaly. Mild pulmonary edema. Disc levels: There is moderate spinal canal stenosis at the T10-11 level. There is also severe bilateral foraminal stenosis at this level. CT LUMBAR SPINE FINDINGS Segmentation: 5 lumbar type vertebrae. Alignment: Normal. Vertebrae: Chronic height loss at L1 of approximately 10% anteriorly. No retropulsion. Paraspinal and other soft tissues: Calcific aortic atherosclerosis. Disc levels: T12-L1: Mild spinal canal stenosis due to disc bulge and facet hypertrophy. L1-2: No spinal canal stenosis or neural impingement. L2-3: No spinal canal stenosis or neural impingement. L3-4: Mild spinal canal stenosis due to facet hypertrophy and disc bulge. L4-5: Moderate spinal canal stenosis due to disc bulge and facet hypertrophy. Mild right and severe left foraminal stenosis. L5-S1: Disc space narrowing mild spinal canal stenosis. Mild right and moderate left foraminal stenosis. IMPRESSION: 1. T10 fracture with anterior distraction that now measures 11 mm, previously 8 mm. 2. Mild progression of height loss of the T11 compression fracture. 3. 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 4. Severe left foraminal stenosis at L4-5 due to disc bulge and facet hypertrophy.  5. Chronic height loss at L1 of approximately 10% anteriorly. 6. Bilateral pleural effusions and mild pulmonary edema. 7. Flowing anterior syndesmophytes along the length of the thoracic spine). 8. Aortic Atherosclerosis (ICD10-I70.0). Electronically Signed   By: Ulyses Jarred M.D.   On: 02/22/2020 21:03   MR THORACIC SPINE W WO CONTRAST  Result Date: 02/21/2020 CLINICAL DATA:  High-grade with marrow edema remains with stable height loss tubular stool marrow edema EXAM: MRI THORACIC WITHOUT AND WITH CONTRAST TECHNIQUE: Multiplanar and multiecho pulse sequences of the thoracic spine were obtained without and with intravenous contrast. CONTRAST:  61mL GADAVIST GADOBUTROL 1 MMOL/ML IV SOLN COMPARISON:  02/08/2020 FINDINGS: Motion artifact is present Alignment:  Stable. Vertebrae: Fracture involving the inferior endplate of J09 is again identified. Additional fracture involving the superior endplate of T26 is again seen. There is persistent but decreased marrow edema. Height loss is slightly increased at T11 but remains less than 50%. Corresponding enhancement is present with some extension into the posterior elements. Chronic L1 compression fracture. Cord: No definite abnormal signal. There is dorsal epidural enhancement at the T9-T11 levels likely related to fractures. Paraspinal and other soft tissues: Paraspinal soft tissue enhancement at the fracture levels. Disc levels: There is persistent canal stenosis at T10-T11 where there is facet arthropathy with ligamentum flavum thickening. This appears increased. There is also increased foraminal stenosis at this level with enhancement noted within the foramina. IMPRESSION: Fractures of T10 and T11 as noted previously. Slightly increased height loss at T11. 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 There is epidural and foraminal enhancement primarily at the fracture levels that is presumably inflammatory and related to the fractures in the absence of clinical  suspicion for other processes such as infection. Electronically Signed   By: Macy Mis M.D.   On: 02/21/2020 20:58   Labs:  Basic Metabolic Panel: BMP Latest Ref Rng & Units 02/25/2020 02/19/2020 02/13/2020  Glucose 70 - 99 mg/dL - 146(H) 160(H)  BUN  8 - 23 mg/dL - 14 17  Creatinine 0.44 - 1.00 mg/dL 0.85 0.93 0.90  BUN/Creat Ratio 12 - 28 - - -  Sodium 135 - 145 mmol/L - 141 139  Potassium 3.5 - 5.1 mmol/L - 3.8 4.1  Chloride 98 - 111 mmol/L - 103 101  CO2 22 - 32 mmol/L - 28 28  Calcium 8.9 - 10.3 mg/dL - 8.5(L) 8.8(L)    CBC: Recent Labs  Lab 02/22/20 0811 02/24/20 0001 02/25/20 0830  WBC 8.9 9.8 10.3  NEUTROABS 6.3  --   --   HGB 10.6* 9.8* 10.9*  HCT 35.8* 33.9* 35.8*  MCV 89.1 89.9 89.9  PLT 303 278 269    CBG: Recent Labs  Lab 02/25/20 1204 02/25/20 1711 02/25/20 1946 02/25/20 2354 02/26/20 0408  GLUCAP 207* 146* 153* 163* 131*    Brief HPI:   Crystal Ashley is a 74 y.o. female with history of T2DM, CHF, A fib, fall 01/31/20 due to hypotension who was evaluated in ED. She received fluids and was discharged to home as X rays negative. She was admitted on 02/07/20 with decline in mobility with difficulty ambulating and was admitted for work up. She as found to have acute on chronic renal failure and treated with medication adjustment and gentle hydration per nephrology input.  She was found to have non displaced superior and inferior pubic rami fractures but continued to be limited by back pain therefore CT spine done for work up per family request. This revealed acute T10 and T11 vertebral body fractures in setting of diffuse thoracic ankylosis as well as chronic L1 compression Fx with 40% loss of height. Dr. Marcello Moores recommended TLSO X 3 months when out of bed and to  repeat upright films in 4 weeks.   Patient with ABLA which was being monitored and lasix resumed as AKI resolving. Therapy was ongoing and patient continued have limitations due to pain and weakness.  CIR was recommended due to functional deficits.    Hospital Course: Aleathia Latresha Yahr was admitted to rehab 02/12/2020 for inpatient therapies to consist of PT  and OT at least three hours five days a week. Past admission physiatrist, therapy team and rehab RN have worked together to provide customized collaborative inpatient rehab. Blood pressures were monitored on TID basis and has been relatively stable on current regimen. Heart rate has been controlled on current regimen and no signs of overload noted. Her diabetes has been monitored with ac/hs CBG checks and SSI was use prn for tighter BS control.  Metformin was not resumed due to poor intake. Family has been bringing food from home as she declined all supplements that were offered.  Vitamin B12 and Vitamin D levels were WNL therefore supplements not resumed.   ABLA due to fracture/hematoma was noted to be stable on follow up CBC. She continued to have limitations due to pain and reported nausea with opioids.  Tylenol was scheduled qid with tramdol prior to therapy sessions to help with activity tolerance.  Oxycodone was offered with antiemetics as well as food to decrease SE. She continued to have decline in mobility with BLE weakness reported with activity as well as bowel urgency with occasional incontinence. Laxative were decreased to once daily to avoid incontinence.  CT head done due to reports of delayed processing and weakness and was negative for acute process. She has had decline in activity tolerance due to pain and family had elected on SNF for progressive therapies. Air mattress overlay  was ordered for pressure relief measures.   Patient's case was discussed with Neurosurgeon and Follow up . MRI/CT lumbar/thoracic spine done for work up revealing some foraminal stenosis L4/L5 and moderate to severe stenosis T10-T11 that was slightly worse but no change in cord signal noted. Dr. Marcello Moores recommended surgical intervention with decompression at  T10-T11 due to worsening of symptoms. Case was discussed with patient's cousin in [place] and IR was consulted per his request for opinion on VP/KP. Case reviewed by Dr. Vernard Gambles who felt that patient was not a candidate due to complexity of fracture. Patient was hesitant to undergo surgery  but eventually agreed to this. Dr. Einar Gip was consulted for cardiac clearance and felt that she was stable from cardiac standpoint but was at high risk for peri procedural complications. Her family hoped that surgery would help with pain and QOL.  Xarelto was discontinued on 07/10 and she was transitioned to IV heparin. She has been unable to tolerate therapies of past few days and focus has been on bed level exercises.  She was discharged to acute care for surgery on 02/26/20.    Rehab course: During patient's stay in rehab team conference was held to monitor patient's progress, set goals and discuss barriers to discharge. At admission, patient required mod assist with basic ADL tasks and min to mod assist with mobility. She  has had decline in activity tolerance and mobility. She requires min assist X 2 for bed mobility.  She requires min assist with UB bathing and mod assist for dressing. She is dependent for LB care and for pericare. She was requiring heavy max assist with Stedy for standing due to BLE weakness with buckling.    Medications at discharge: 1. IV heparin per pharmacy to cleared to resume Xarelto. 2. Cardizem CD 240 mg po daily. 3. Cosopt I gtt OU bid 4. Lasix 40 mg po daily. 5. Levothyroxine 75 mcg po daily 6. Toprol XL 100 mg daily 7. Xalatan 0.005% 1 gtt OU HS. 8. Ultram 50 mg po every 6 hours prn. 9. Senna S 2 tabs po HS. 10. Lidocaine patch --apply one to left and right paraspinals  Daily.  11. Atorvastatin 10 mg po daily.    Diet: Heart healthy/Carb modified.   Disposition: Acute hospital.      Signed: Bary Leriche 02/26/2020, 9:59 AM

## 2020-02-26 NOTE — Anesthesia Procedure Notes (Signed)
Arterial Line Insertion Start/End7/07/2020 7:50 AM, 02/26/2020 7:57 AM Performed by: Catalina Gravel, MD, anesthesiologist  Patient location: OR. Preanesthetic checklist: patient identified, IV checked, site marked, risks and benefits discussed, surgical consent, monitors and equipment checked, pre-op evaluation, timeout performed and anesthesia consent Lidocaine 1% used for infiltration radial was placed Catheter size: 20 Fr Hand hygiene performed  and maximum sterile barriers used   Attempts: 1 Procedure performed using ultrasound guided technique. Ultrasound Notes:anatomy identified, needle tip was noted to be adjacent to the nerve/plexus identified, no ultrasound evidence of intravascular and/or intraneural injection and image(s) printed for medical record Following insertion, dressing applied and Biopatch. Post procedure assessment: normal and unchanged  Patient tolerated the procedure well with no immediate complications.

## 2020-02-27 ENCOUNTER — Encounter (HOSPITAL_COMMUNITY): Payer: Self-pay | Admitting: Neurosurgery

## 2020-02-27 LAB — GLUCOSE, CAPILLARY
Glucose-Capillary: 125 mg/dL — ABNORMAL HIGH (ref 70–99)
Glucose-Capillary: 148 mg/dL — ABNORMAL HIGH (ref 70–99)
Glucose-Capillary: 161 mg/dL — ABNORMAL HIGH (ref 70–99)
Glucose-Capillary: 167 mg/dL — ABNORMAL HIGH (ref 70–99)
Glucose-Capillary: 179 mg/dL — ABNORMAL HIGH (ref 70–99)
Glucose-Capillary: 204 mg/dL — ABNORMAL HIGH (ref 70–99)

## 2020-02-27 NOTE — Progress Notes (Signed)
Subjective: Patient reports "a little back pain", better than preop  Objective: Vital signs in last 24 hours: Temp:  [96.1 F (35.6 C)-98.3 F (36.8 C)] 98.2 F (36.8 C) (07/13 0427) Pulse Rate:  [32-91] 78 (07/13 0427) Resp:  [10-20] 20 (07/13 0427) BP: (120-150)/(61-98) 130/63 (07/13 0427) SpO2:  [97 %-100 %] 97 % (07/13 0427) Arterial Line BP: (154-165)/(73-84) 158/76 (07/12 1500)  Intake/Output from previous day: 07/12 0701 - 07/13 0700 In: 3568 [P.O.:240; I.V.:2678; IV Piggyback:350] Out: 1140 [MPNTI:1443; Blood:75] Intake/Output this shift: No intake/output data recorded.  Dressings c/d 3/5 L DF, 4/5 PF, 3/5 KE 4-/5 R DF, 4/5 PF, 2/5 KE  Lab Results: Recent Labs    02/25/20 0830 02/26/20 0839  WBC 10.3  --   HGB 10.9* 10.2*  HCT 35.8* 30.0*  PLT 269  --    BMET Recent Labs    02/25/20 0830 02/26/20 0839  NA  --  139  K  --  3.3*  CREATININE 0.85  --     Studies/Results: DG Thoracolumabar Spine  Result Date: 02/26/2020 CLINICAL DATA:  T8-T12 fusion. EXAM: THORACOLUMBAR SPINE 1V COMPARISON:  CT thoracic spine dated February 22, 2020. FLUOROSCOPY TIME:  2 minutes, 59 seconds. C-arm fluoroscopic images were obtained intraoperatively and submitted for post operative interpretation. FINDINGS: Frontal and lateral intraoperative fluoroscopic images demonstrate interval T8-T12 posterior fusion. No evidence of hardware failure or loosening. Small amount of cement in the T10 vertebral body and around the T8, T9, and T12 pedicle screws. Known T10-T11 fractures are better evaluated on recent CT. IMPRESSION: 1. Intraoperative fluoroscopic guidance for T8-T12 posterior fusion. Electronically Signed   By: Titus Dubin M.D.   On: 02/26/2020 14:12   DG C-Arm 1-60 Min  Result Date: 02/26/2020 CLINICAL DATA:  T8-T12 fusion. EXAM: THORACOLUMBAR SPINE 1V COMPARISON:  CT thoracic spine dated February 22, 2020. FLUOROSCOPY TIME:  2 minutes, 59 seconds. C-arm fluoroscopic images were  obtained intraoperatively and submitted for post operative interpretation. FINDINGS: Frontal and lateral intraoperative fluoroscopic images demonstrate interval T8-T12 posterior fusion. No evidence of hardware failure or loosening. Small amount of cement in the T10 vertebral body and around the T8, T9, and T12 pedicle screws. Known T10-T11 fractures are better evaluated on recent CT. IMPRESSION: 1. Intraoperative fluoroscopic guidance for T8-T12 posterior fusion. Electronically Signed   By: Titus Dubin M.D.   On: 02/26/2020 14:12    Assessment/Plan: 74 yo F POD#1 s/p T8-12 perc fusion and T10-11 decompression who is doing well, strength improved slightly from preoperatively - cont PT/OT - nutrition, Ensure supplements - lovenox for dvt ppx - resume Xarelto POD # 7   [name] 02/27/2020, 10:51 AM

## 2020-02-27 NOTE — Evaluation (Signed)
Physical Therapy Evaluation Patient Details Name: Crystal Ashley MRN: [id number] DOB: 11-22-1945 Today's Date: 02/27/2020   History of Present Illness  74 y.o. female with fall at home with LLE numbness and syncope, low BP resulting from meds.  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  s/p T8-T12  posterior percutaneous instrumentation with cement augmentation and bilateral medial facetectomy for decompression at T10-T11. PMHx:  Afib, DM, CHF  Clinical Impression  Patient presents with pain, marked weakness and post surgical deficits s/p above surgery. Pt was recently in [place] from 6/28-7/12 but only able to tolerate bed level exercises the last week due to pain so pt has had limited mobility prior to surgery. Today, pt requires max-total A of 2 for bed mobility and attempted standing with stedy. Unable to stand from stedy with total A of 2 with little effort. Pt requires coaxing and encouragement to participate. Appears anxious and fearful of mobility. Son present to provide information and encouragement. Education on back precautions and brace. Would benefit from SNF to maximize independence and mobility prior to return home. Will follow acutely.    Follow Up Recommendations SNF;Supervision for mobility/OOB    Equipment Recommendations  Other (comment) (defer to SNF)    Recommendations for Other Services       Precautions / Restrictions Precautions Precautions: Back;Fall Precaution Booklet Issued: No Precaution Comments: reviewed back precautions with pt Required Braces or Orthoses: Spinal Brace Spinal Brace: Thoracolumbosacral orthotic;Applied in sitting position Spinal Brace Comments: TLSO when OOB Restrictions Weight Bearing Restrictions: No      Mobility  Bed Mobility Overal bed mobility: Needs Assistance Bed Mobility: Rolling;Sidelying to Sit;Sit to Supine Rolling: Max assist;+2 for physical assistance Sidelying to  sit: Max assist;+2 for physical assistance;HOB elevated Supine to sit: Max assist;+2 for physical assistance Sit to supine: Max assist;+2 for physical assistance   General bed mobility comments: Max A for lowering LEs and elevating trunk to midline. Cues for log roll technique, step by step and to reach for rail. Increased time/effort.  Transfers Overall transfer level: Needs assistance Equipment used: Ambulation equipment used Transfers: Sit to/from Stand Sit to Stand: Total assist;+2 physical assistance;+2 safety/equipment;From elevated surface         General transfer comment: attempted sit<>stand x1 without success needing total A +2, getting halfway up with little effort with bil knees blocked from stedy.  Ambulation/Gait             General Gait Details: UNable  Stairs            Wheelchair Mobility    Modified Rankin (Stroke Patients Only)       Balance Overall balance assessment: Needs assistance;History of Falls Sitting-balance support: Feet supported;Bilateral upper extremity supported Sitting balance-Leahy Scale: Poor Sitting balance - Comments: Initially needing total A progressing to Min gaurd with UE support and cues to lean forward. LOB backwards x2 without warning. Postural control: Posterior lean Standing balance support: During functional activity Standing balance-Leahy Scale: Zero Standing balance comment: utilized stedy for sit<>stand without success with Total A +2                             Pertinent Vitals/Pain Pain Assessment: Faces Faces Pain Scale: Hurts little more Pain Location: back - incisional Pain Descriptors / Indicators: Aching;Grimacing;Operative site guarding Pain Intervention(s): Monitored during session;Repositioned;Limited activity within patient's tolerance    Home Living Family/patient expects to  be discharged to:: Private residence Living Arrangements: Children Available Help at Discharge:  Family;Available 24 hours/day Type of Home: House Home Access: Stairs to enter Entrance Stairs-Rails: Right Entrance Stairs-Number of Steps: 3 Home Layout: Two level;Able to live on main level with bedroom/bathroom Home Equipment: Shower seat;Cane - single point;Walker - standard;Walker - 4 wheels;Other (comment) Additional Comments: has standard walker; has w/c    Prior Function Level of Independence: Independent with assistive device(s)         Comments: Dressing/bathing self. prior to fall, did not walk much but independent with bed mobility, transfers and short household distances, Pt recently in [facility] from 6-28-7/12 and mainly tolerated bed level exercises due to pain.     Hand Dominance   Dominant Hand: Right    Extremity/Trunk Assessment   Upper Extremity Assessment Upper Extremity Assessment: Defer to OT evaluation    Lower Extremity Assessment Lower Extremity Assessment: Generalized weakness    Cervical / Trunk Assessment Cervical / Trunk Assessment: Other exceptions Cervical / Trunk Exceptions: s/p thoracic surgery  Communication   Communication: No difficulties  Cognition Arousal/Alertness: Awake/alert Behavior During Therapy: WFL for tasks assessed/performed;Anxious Overall Cognitive Status: History of cognitive impairments - at baseline                                 General Comments: Pt followed 1 step commands with increased time - inconsistently. Needs repetition and assist from son at times. Anxious. Appears fearful "I can't i can't"      General Comments General comments (skin integrity, edema, etc.): Son present during session.    Exercises     Assessment/Plan    PT Assessment Patient needs continued PT services  PT Problem List Decreased strength;Decreased activity tolerance;Decreased balance;Decreased mobility;Decreased knowledge of use of DME;Decreased safety awareness;Pain;Decreased cognition;Decreased skin integrity;Decreased  knowledge of precautions       PT Treatment Interventions DME instruction;Gait training;Stair training;Functional mobility training;Therapeutic activities;Therapeutic exercise;Balance training;Neuromuscular re-education;Patient/family education    PT Goals (Current goals can be found in the Care Plan section)  Acute Rehab PT Goals Patient Stated Goal: to get better PT Goal Formulation: With patient/family Time For Goal Achievement: 03/12/20 Potential to Achieve Goals: Good    Frequency Min 3X/week   Barriers to discharge        Co-evaluation PT/OT/SLP Co-Evaluation/Treatment: Yes Reason for Co-Treatment: For patient/therapist safety;To address functional/ADL transfers PT goals addressed during session: Mobility/safety with mobility OT goals addressed during session: ADL's and self-care;Strengthening/ROM;Proper use of Adaptive equipment and DME       AM-PAC PT "6 Clicks" Mobility  Outcome Measure Help needed turning from your back to your side while in a flat bed without using bedrails?: A Lot Help needed moving from lying on your back to sitting on the side of a flat bed without using bedrails?: Total Help needed moving to and from a bed to a chair (including a wheelchair)?: Total Help needed standing up from a chair using your arms (e.g., wheelchair or bedside chair)?: Total Help needed to walk in hospital room?: Total Help needed climbing 3-5 steps with a railing? : Total 6 Click Score: 7    End of Session Equipment Utilized During Treatment: Back brace Activity Tolerance: Patient limited by pain Patient left: in bed;with call bell/phone within reach;with bed alarm set;with family/visitor present Nurse Communication: Mobility status;Need for lift equipment PT Visit Diagnosis: Muscle weakness (generalized) (M62.81);Pain Pain - part of body:  (back)  Time: 1751-0258 PT Time Calculation (min) (ACUTE ONLY): 32 min   Charges:   PT Evaluation $PT Eval Moderate  Complexity: 1 Mod          Marisa Severin, PT, DPT Acute Rehabilitation Services Pager [phone] Office [phone]      Marguarite Arbour A Sabra Heck 02/27/2020, 1:18 PM

## 2020-02-27 NOTE — Progress Notes (Signed)
Inpatient Rehabilitation-Admissions Coordinator   Pt with recent admission to [facility] (6/28-7/12) prior to transferring back to acute for elective surgery. Per medical team, this pt was making slow progress in CIR and was not able to tolerate the intensity of an IP Rehab program. Per CIR SW note, the patient's son was in favor of finding placement post surgery. AC agrees with therapy recommendation for SNF.   This AC will no longer follow or pursue return to [facility].   Raechel Ache, OTR/L  Rehab Admissions Coordinator  [phone] 02/27/2020 1:25 PM

## 2020-02-27 NOTE — Evaluation (Addendum)
Occupational Therapy Evaluation Patient Details Name: Crystal Ashley MRN: [id number] DOB: Apr 02, 1946 Today's Date: 02/27/2020    History of Present Illness 74 y.o. female with fall at home with LLE numbness and syncope, low BP resulting from meds.  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 Pt seen in [facility] for rehab s/p fall. Pt is now s/p T8-T12  posterior percutaneous instrumentation with cement augmentation and bilateral medial facetectomy for decompression at T10-T11. PMHx:  Afib, DM, CHF   Clinical Impression   PTA pt reports being modified independent using RW for mobility and independent in ADLs. Pt was admitted for above and treated for problem list below (see OT Problem List). Requires set up - Total A +2 with verbal cues for sequencing with ADLs due to decreased awareness of precautions, balance, strength, and steadiness on feet. Requires Total A +2 with transfers with multimodal cues for sequencing. Attempt sit<>stand x1 with use of Stedy without success - unable to reach full stand with total A +2. Pt completed bed mobility with log roll technique with Max A +2. Pt needing max encouragement and reassurance with positional changes. Pt with decreased awareness of precautions and needing multimodal cues for sequencing. Needing increased time for 1 step commands - follows inconsistently. Believe pt would benefit from skilled OT services acutely and at the SNF level to increase safety and participation with ADLs.    Follow Up Recommendations  SNF;Supervision/Assistance - 24 hour    Equipment Recommendations  Other (comment) (TBD at next venue of care)       Precautions / Restrictions Precautions Precautions: Back;Fall Precaution Booklet Issued: No Precaution Comments: reviewed back precautions with pt Required Braces or Orthoses: Spinal Brace Spinal Brace: Thoracolumbosacral orthotic;Applied in sitting position Spinal Brace  Comments: TLSO when OOB Restrictions Weight Bearing Restrictions: No      Mobility Bed Mobility Overal bed mobility: Needs Assistance Bed Mobility: Rolling;Supine to Sit;Sit to Supine Rolling: Max assist;+2 for physical assistance   Supine to sit: Max assist;+2 for physical assistance Sit to supine: Max assist;+2 for physical assistance   General bed mobility comments: Max A for lowering LEs and elevating trunk to midline  Transfers Overall transfer level: Needs assistance   Transfers: Sit to/from Stand Sit to Stand: Total assist;+2 physical assistance;+2 safety/equipment;From elevated surface         General transfer comment: attempted sit<>stand with Stedy x1 unable to complete full sit to stand even with total A +2    Balance Overall balance assessment: Needs assistance;History of Falls Sitting-balance support: Feet supported;Bilateral upper extremity supported Sitting balance-Leahy Scale: Poor Sitting balance - Comments: pt needing BUE support Postural control: Posterior lean Standing balance support: Bilateral upper extremity supported;During functional activity Standing balance-Leahy Scale: Zero Standing balance comment: utilized stedy for sit<>stand without success with Total A +2                           ADL either performed or assessed with clinical judgement   ADL Overall ADL's : Needs assistance/impaired     Grooming: Wash/dry hands;Wash/dry face;Set up;Bed level Grooming Details (indicate cue type and reason): set up at bed level Upper Body Bathing: Sitting;Moderate assistance Upper Body Bathing Details (indicate cue type and reason): Mod A due to sitting balance Lower Body Bathing: Total assistance;+2 for physical assistance;Sit to/from stand;Cueing for back precautions;Cueing for sequencing Lower Body Bathing Details (indicate cue type and reason): Total A+2  for sit<>stand Upper Body Dressing : Moderate assistance;Sitting;Cueing for  sequencing Upper Body Dressing Details (indicate cue type and reason): Mod A for sitting balance Lower Body Dressing: Total assistance;Sit to/from stand;+2 for physical assistance;Cueing for back precautions;Cueing for sequencing Lower Body Dressing Details (indicate cue type and reason): Total A+2 for sit<>stand amd reliance on BUE support   Toilet Transfer Details (indicate cue type and reason): deferred due to pt safety Toileting- Clothing Manipulation and Hygiene: Total assistance;+2 for physical assistance;Sit to/from stand;Adhering to back precautions;Cueing for back precautions;Cueing for sequencing Toileting - Clothing Manipulation Details (indicate cue type and reason): Total A+2 for sit<>stand   Tub/Shower Transfer Details (indicate cue type and reason): deferred due to pt safety Functional mobility during ADLs:  (deferred due to pt safety) General ADL Comments: Pt with weakness, decreased knowledge of precautions, and pain,.     Vision Baseline Vision/History: Cataracts;Wears glasses Wears Glasses: Reading only Patient Visual Report: No change from baseline Vision Assessment?: No apparent visual deficits            Pertinent Vitals/Pain Pain Assessment: Faces Faces Pain Scale: Hurts little more Pain Location: back - incisional Pain Descriptors / Indicators: Aching;Grimacing;Operative site guarding Pain Intervention(s): Limited activity within patient's tolerance;Monitored during session;Repositioned     Hand Dominance Right   Extremity/Trunk Assessment Upper Extremity Assessment Upper Extremity Assessment: Generalized weakness   Lower Extremity Assessment Lower Extremity Assessment: Defer to PT evaluation   Cervical / Trunk Assessment Cervical / Trunk Assessment: Other exceptions Cervical / Trunk Exceptions: s/p thoracic surgery   Communication Communication Communication: No difficulties   Cognition Arousal/Alertness: Awake/alert Behavior During Therapy: WFL  for tasks assessed/performed;Anxious Overall Cognitive Status: History of cognitive impairments - at baseline                                 General Comments: Pt followed 1 step commands with increased time - inconsistently   General Comments  Pt's son present for eval and able to confirm PLOF and home set up with support            Home Living Family/patient expects to be discharged to:: Private residence Living Arrangements: Children Available Help at Discharge: Family;Available 24 hours/day Type of Home: House Home Access: Stairs to enter [employer] of Steps: 3 Entrance Stairs-Rails: Right Home Layout: Two level;Able to live on main level with bedroom/bathroom     Bathroom Shower/Tub: Occupational psychologist: Handicapped height Bathroom Accessibility: Yes   Home Equipment: Shower seat;Cane - single point;Walker - standard;Walker - 4 wheels;Other (comment)   Additional Comments: has standard walker; has w/c      Prior Functioning/Environment Level of Independence: Independent with assistive device(s)        Comments: Dressing/bathing self. prior to fall, did not walk much but independent with bed mobility, transfers and short household distances.        OT Problem List: Decreased activity tolerance;Decreased safety awareness;Decreased knowledge of use of DME or AE;Decreased knowledge of precautions;Decreased strength;Impaired balance (sitting and/or standing);Pain;Obesity      OT Treatment/Interventions: Self-care/ADL training;DME and/or AE instruction;Therapeutic activities;Patient/family education;Balance training;Energy conservation;Therapeutic exercise    OT Goals(Current goals can be found in the care plan section) Acute Rehab OT Goals Patient Stated Goal: to get better OT Goal Formulation: With patient Time For Goal Achievement: 03/12/20 Potential to Achieve Goals: Good  OT Frequency: Min 2X/week   Barriers to D/C:  Decreased caregiver support  pt family working and in school       Co-evaluation PT/OT/SLP Co-Evaluation/Treatment: Yes Reason for Co-Treatment: To address functional/ADL transfers;For patient/therapist safety   OT goals addressed during session: ADL's and self-care;Strengthening/ROM;Proper use of Adaptive equipment and DME      AM-PAC OT "6 Clicks" Daily Activity     Outcome Measure Help from another person eating meals?: None Help from another person taking care of personal grooming?: A Little Help from another person toileting, which includes using toliet, bedpan, or urinal?: Total Help from another person bathing (including washing, rinsing, drying)?: Total Help from another person to put on and taking off regular upper body clothing?: A Lot Help from another person to put on and taking off regular lower body clothing?: Total 6 Click Score: 12   End of Session Equipment Utilized During Treatment: Gait belt;Rolling walker;Back brace Nurse Communication: Mobility status  Activity Tolerance: Patient limited by fatigue;Patient limited by pain Patient left: in bed;with call bell/phone within reach;with bed alarm set;with family/visitor present (son)  OT Visit Diagnosis: Unsteadiness on feet (R26.81);History of falling (Z91.81);Pain;Other symptoms and signs involving the nervous system (R29.898);Muscle weakness (generalized) (M62.81) Pain - part of body:  (back - incisional)                Time: 3567-0141 OT Time Calculation (min): 31 min Charges:  OT General Charges $OT Visit: 1 Visit OT Evaluation $OT Eval Moderate Complexity: 1 Mod  Tama Grosz/OTS  Tearsa Kowalewski 02/27/2020, 11:25 AM

## 2020-02-28 ENCOUNTER — Inpatient Hospital Stay (HOSPITAL_COMMUNITY): Payer: Medicare Other

## 2020-02-28 DIAGNOSIS — Z794 Long term (current) use of insulin: Secondary | ICD-10-CM

## 2020-02-28 DIAGNOSIS — E11649 Type 2 diabetes mellitus with hypoglycemia without coma: Secondary | ICD-10-CM

## 2020-02-28 DIAGNOSIS — Z419 Encounter for procedure for purposes other than remedying health state, unspecified: Secondary | ICD-10-CM

## 2020-02-28 DIAGNOSIS — I509 Heart failure, unspecified: Secondary | ICD-10-CM

## 2020-02-28 DIAGNOSIS — I4891 Unspecified atrial fibrillation: Secondary | ICD-10-CM

## 2020-02-28 LAB — BASIC METABOLIC PANEL
Anion gap: 10 (ref 5–15)
BUN: 15 mg/dL (ref 8–23)
CO2: 27 mmol/L (ref 22–32)
Calcium: 8.2 mg/dL — ABNORMAL LOW (ref 8.9–10.3)
Chloride: 102 mmol/L (ref 98–111)
Creatinine, Ser: 0.81 mg/dL (ref 0.44–1.00)
GFR calc Af Amer: >60 mL/min (ref >60)
GFR calc non Af Amer: >60 mL/min (ref >60)
Glucose, Bld: 275 mg/dL — ABNORMAL HIGH (ref 70–99)
Potassium: 3.7 mmol/L (ref 3.5–5.1)
Sodium: 139 mmol/L (ref 135–145)

## 2020-02-28 LAB — GLUCOSE, CAPILLARY
Glucose-Capillary: 159 mg/dL — ABNORMAL HIGH (ref 70–99)
Glucose-Capillary: 201 mg/dL — ABNORMAL HIGH (ref 70–99)
Glucose-Capillary: 252 mg/dL — ABNORMAL HIGH (ref 70–99)
Glucose-Capillary: 175 mg/dL — ABNORMAL HIGH (ref 70–99)

## 2020-02-28 LAB — MAGNESIUM: Magnesium: 1.8 mg/dL (ref 1.7–2.4)

## 2020-02-28 LAB — ECHOCARDIOGRAM COMPLETE: Weight: 3100.55 oz

## 2020-02-28 IMAGING — DX DG CHEST 1V
1 series · 1 of 1 positions shown · non-contrast
Comparison: [DATE].

CLINICAL DATA: Shortness of breath.

EXAM:
CHEST  1 VIEW

[chest ap]
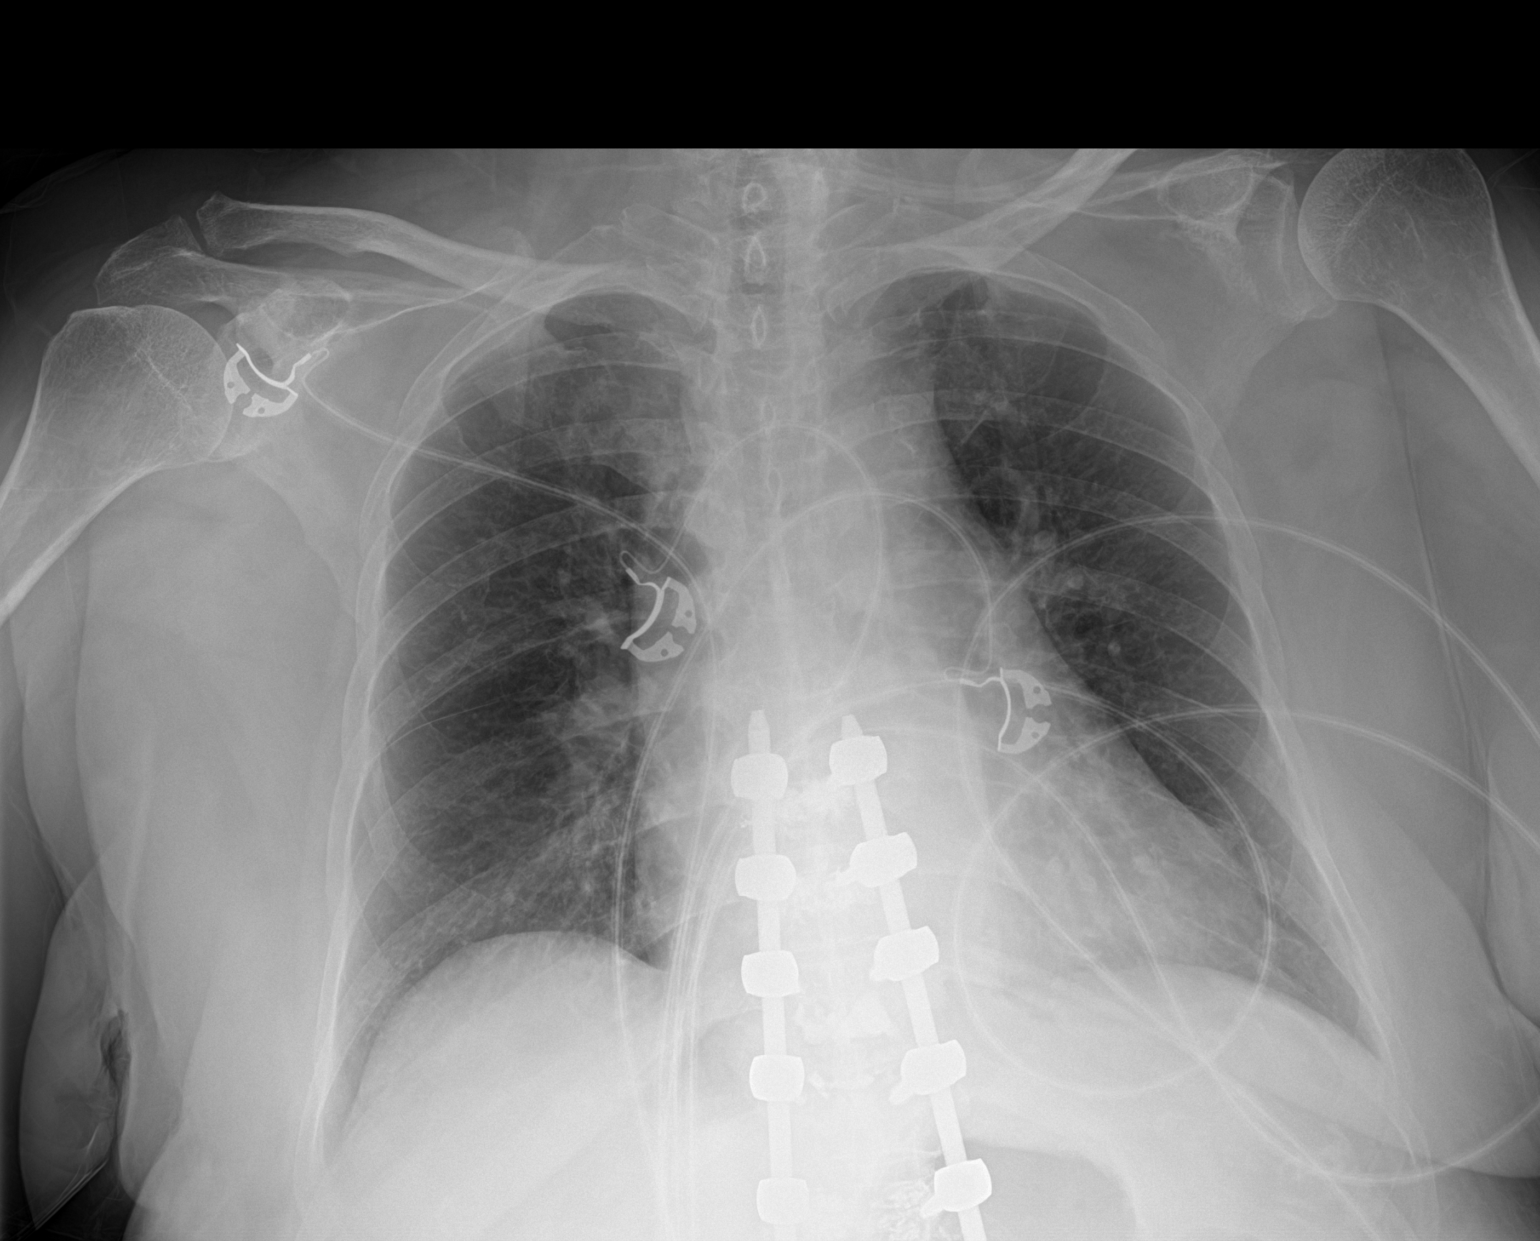

[1 of 1 positions shown; findings below may reference images not displayed]

FINDINGS: Stable cardiomediastinal silhouette. No pneumothorax or pleural
effusion is noted. Both lungs are clear. Status post surgical
posterior fusion of lower thoracic and lumbar spine.
IMPRESSION: No active disease.

## 2020-02-28 MED ORDER — DILTIAZEM HCL 60 MG PO TABS
60.0000 mg | ORAL_TABLET | Freq: Four times a day (QID) | ORAL | Status: DC
  Administered 2020-02-28 – 2020-02-29 (×2): 60 mg via ORAL
  Filled 2020-02-28 (×2): qty 1

## 2020-02-28 MED ORDER — MAGNESIUM OXIDE 400 (241.3 MG) MG PO TABS
400.0000 mg | ORAL_TABLET | Freq: Two times a day (BID) | ORAL | Status: AC
  Administered 2020-02-28 – 2020-02-29 (×2): 400 mg via ORAL
  Filled 2020-02-28 (×2): qty 1

## 2020-02-28 MED ORDER — INSULIN GLARGINE 100 UNIT/ML ~~LOC~~ SOLN
10.0000 [IU] | Freq: Every day | SUBCUTANEOUS | Status: DC
  Administered 2020-02-28 – 2020-03-01 (×3): 10 [IU] via SUBCUTANEOUS
  Filled 2020-02-28 (×4): qty 0.1

## 2020-02-28 MED ORDER — POTASSIUM CHLORIDE CRYS ER 20 MEQ PO TBCR
40.0000 meq | EXTENDED_RELEASE_TABLET | Freq: Once | ORAL | Status: AC
  Administered 2020-02-28: 40 meq via ORAL
  Filled 2020-02-28: qty 2

## 2020-02-28 NOTE — Progress Notes (Signed)
   02/28/20 1140  Assess: MEWS Score  Temp 98.7 F (37.1 C)  BP 124/79  Pulse Rate (!) 103  ECG Heart Rate (!) 105  Resp (!) 23  Level of Consciousness Alert  SpO2 96 %  O2 Device Nasal Cannula  O2 Flow Rate (L/min) 2 L/min    Paged NRS of change of yellow MEWS, O2 sats in high 80s, placed pt on 2L O2. Will follow Mews protocol with increased VS and continue to montior.

## 2020-02-28 NOTE — Progress Notes (Signed)
  Echocardiogram 2D Echocardiogram has been performed.  Garnet Sierras 02/28/2020, 3:59 PM

## 2020-02-28 NOTE — Progress Notes (Signed)
   02/28/20 1400  Assess: MEWS Score  Pulse Rate (!) 112  ECG Heart Rate (!) 106  Resp (!) 31  Level of Consciousness Alert  SpO2 99 %  O2 Device Nasal Cannula  O2 Flow Rate (L/min) 2 L/min   Pending chest Xray results for shortness of breath. Pt remains alert and oriented x4. Will continue to monitor.

## 2020-02-28 NOTE — Consult Note (Signed)
[facility] Medical Consultation  Crystal Ashley ZDG:[id number] DOB: 1945-09-22 DOA: 02/26/2020 PCP: [name], PA   Requesting physician: Dr. Marcello Moores Date of consultation: 02/28/2020 Reason for consultation: A-fib, DM  Impression/Recommendations Active Problems:   Fusion of spine, thoracolumbar region    1. Uncontrolled A. fib.  Increased patient Cardizem from 180-240 daily, split into every 6 hours for better heart rate control.  Also consider increased metoprolol dosage.  Check echo.  Xarelto has been on hold her postop recent.  Restart as per neurosurgeon. Check Mg and K 2. IDDM with episodes of hypoglycemia, continue current sliding scale regimen, add Lantus 3. CHF, as above check echo.  Clinically looks like patient euvolemic and chest x-ray today reassuring, continue Lasix p.o. 4. HTN. As above. 5.   I will followup again tomorrow. Please contact me if I can be of assistance in the meanwhile. Thank you for this consultation.  Chief Complaint: I feel fine  HPI:  Patient is 74 year old, history of chronic combined CHF, paroxysmal A. fib on Xarelto, hypertension, IDDM, is currently in neurosurgery unit after open reduction of thoracic spine fracture T10-11.  Team was consulted for poorly controlled A. fib and hyperglycemia.  This morning, denied any chest pain short of breath or palpitations.  However nurse reported patient heart rate has fluctuated between 90 and 120s since this morning.  No significant fever.  Patient reported been eating and drinking well denies any abdominal pain no dysuria no short of breath no cough.  Review of Systems:  10 points of review systems are negative except those mentioned in HPI  Past Medical History:  Diagnosis Date  . Atrial fibrillation ([place])   . Cancer ([place])   . Cataract   . Fracture 2001   left ankle  . Glaucoma   . Hypertension   . Thyroid disease    Past Surgical History:  Procedure Laterality Date  . APPLICATION  OF ROBOTIC ASSISTANCE FOR SPINAL PROCEDURE N/A 02/26/2020   Procedure: APPLICATION OF ROBOTIC ASSISTANCE FOR SPINAL PROCEDURE;  Surgeon: [name], MD;  Location: [place];  Service: Neurosurgery;  Laterality: N/A;  . CARDIOVERSION N/A 04/08/2018   Procedure: CARDIOVERSION;  Surgeon: Adrian Prows, MD;  Location: [facility] ENDOSCOPY;  Service: Cardiovascular;  Laterality: N/A;  . CARDIOVERSION N/A 05/22/2019   Procedure: CARDIOVERSION;  Surgeon: Adrian Prows, MD;  Location: [place];  Service: Cardiovascular;  Laterality: N/A;  . FOOT SURGERY Left   . HYSTEROSCOPY     POLYPECTOMY  . LUMBAR PERCUTANEOUS PEDICLE SCREW 4 LEVEL N/A 02/26/2020   Procedure: Thoracic eight to thoracic twelve posterior percutaneous instrumentation with cement augmentation and bilateral medial facetectomy for decompression at Thoracic ten-eleven;  Surgeon: [name], MD;  Location: [place];  Service: Neurosurgery;  Laterality: N/A;   Social History:  reports that she has never smoked. She has never used smokeless tobacco. She reports that she does not drink alcohol and does not use drugs.  No Known Allergies Family History  Problem Relation Age of Onset  . Diabetes Son   . Healthy Mother   . Healthy Father     Prior to Admission medications   Medication Sig Start Date End Date Taking? Authorizing Provider  atorvastatin (LIPITOR) 10 MG tablet Take 1 tablet (10 mg total) by mouth daily. 12/01/19   [name], PA  cholecalciferol (VITAMIN D) 1000 UNITS tablet Take 5,000 Units by mouth See admin instructions. Dorene Grebe, Wed, friday    [provider]  cyanocobalamin (,VITAMIN B-12,) 1000  MCG/ML injection 1000 mcg (1 mg) injection once per week for four weeks, followed by 1000 mcg injection once per month. Patient taking differently: Inject 1,000 mcg into the muscle every 30 (thirty) days.  09/23/18   Briscoe Deutscher, DO  diltiazem (CARDIZEM CD) 180 MG 24 hr capsule Take 1 capsule (180 mg total) by mouth  daily. 01/29/20 04/28/20  Adrian Prows, MD  dorzolamide-timolol (COSOPT) 22.3-6.8 MG/ML ophthalmic solution Place 1 drop into both eyes 2 (two) times daily.  10/05/17   [provider]  furosemide (LASIX) 40 MG tablet Take 1 tablet (40 mg total) by mouth daily. 11/29/19   [name], PA  levothyroxine (SYNTHROID) 75 MCG tablet Take 1 tablet (75 mcg total) by mouth daily before breakfast. 12/01/19   [name], PA  metFORMIN (GLUCOPHAGE) 1000 MG tablet Take 1 tablet (1,000 mg total) by mouth daily with breakfast. 12/01/19   [name], PA  metoprolol succinate (TOPROL-XL) 100 MG 24 hr tablet Take 1 tablet (100 mg total) by mouth daily. Take with or immediately following a meal. 12/01/19   [name], PA  polyethylene glycol (MIRALAX / GLYCOLAX) 17 g packet Take 17 g by mouth daily as needed for mild constipation. 02/12/20   Kayleen Memos, DO  Rivaroxaban (XARELTO) 15 MG TABS tablet Take 1 tablet (15 mg total) by mouth daily with supper. Patient taking differently: Take 15 mg by mouth daily.  05/22/19   Adrian Prows, MD  travoprost, benzalkonium, (TRAVATAN) 0.004 % ophthalmic solution Place 1 drop into both eyes at bedtime.     [provider]   Physical Exam: Blood pressure (!) 150/84, pulse 95, temperature 99.4 F (37.4 C), temperature source Oral, resp. rate (!) 21, SpO2 100 %. Vitals:   02/28/20 1400 02/28/20 1633  BP:  (!) 150/84  Pulse: (!) 112 95  Resp: (!) 31 (!) 21  Temp:  99.4 F (37.4 C)  SpO2: 99% 100%     General: No acute distress  Eyes: HEENT  ENT: Supple no JVD  Neck: Supple  Cardiovascular: Irregular heartbeat, no murmurs no gallops  Respiratory: No wheezing or crackles  Abdomen: Soft nontender nondistended  Skin: No rash no ulcers  Musculoskeletal: No tenderness  Psychiatric: Calm  Neurologic: No focal deficit  Labs on Admission:  Basic Metabolic Panel: Recent Labs  Lab 02/25/20 0830 02/26/20 0839 02/28/20 1508  NA   --  139 139  K  --  3.3* 3.7  CL  --   --  102  CO2  --   --  27  GLUCOSE  --   --  275*  BUN  --   --  15  CREATININE 0.85  --  0.81  CALCIUM  --   --  8.2*  MG  --   --  1.8   Liver Function Tests: No results for input(s): AST, ALT, ALKPHOS, BILITOT, PROT, ALBUMIN in the last 168 hours. No results for input(s): LIPASE, AMYLASE in the last 168 hours. No results for input(s): AMMONIA in the last 168 hours. CBC: Recent Labs  Lab 02/22/20 0811 02/24/20 0001 02/25/20 0830 02/26/20 0839  WBC 8.9 9.8 10.3  --   NEUTROABS 6.3  --   --   --   HGB 10.6* 9.8* 10.9* 10.2*  HCT 35.8* 33.9* 35.8* 30.0*  MCV 89.1 89.9 89.9  --   PLT 303 278 269  --    Cardiac Enzymes: No results for input(s): CKTOTAL, CKMB, CKMBINDEX, TROPONINI in the last 168 hours.  BNP: Invalid input(s): POCBNP CBG: Recent Labs  Lab 02/27/20 1541 02/27/20 2141 02/28/20 0752 02/28/20 1146 02/28/20 1632  GLUCAP 125* 204* 159* 201* 252*    Radiological Exams on Admission: DG Chest 1 View  Result Date: 02/28/2020 CLINICAL DATA:  Shortness of breath. EXAM: CHEST  1 VIEW COMPARISON:  February 07, 2020. FINDINGS: Stable cardiomediastinal silhouette. No pneumothorax or pleural effusion is noted. Both lungs are clear. Status post surgical posterior fusion of lower thoracic and lumbar spine. IMPRESSION: No active disease. Electronically Signed   By: Marijo Conception M.D.   On: 02/28/2020 15:48   ECHOCARDIOGRAM COMPLETE  Result Date: 02/28/2020    ECHOCARDIOGRAM REPORT   Patient Name:   Crystal Ashley Date of Exam: 02/28/2020 Medical Rec #:  [id number]          Height:       60.0 in Accession #:    [id number]         Weight:       193.8 lb Date of Birth:  [birth year]           BSA:          1.842 m Patient Age:    74 years           BP:           124/74 mmHg Patient Gender: F                  HR:           112 bpm. Exam Location:  Inpatient Procedure: 2D Echo Indications:     Atrial Fibrillation I48.91  History:          Patient has prior history of Echocardiogram examinations, most                  recent 10/19/2018. Risk Factors:Hypertension.  Sonographer:     Mikki Santee RDCS (AE) Referring Phys:  [id number] Lequita Halt Diagnosing Phys: Adrian Prows MD IMPRESSIONS  1. Left ventricular ejection fraction, by estimation, is 65 to 70%. The left ventricle has hyperdynamic function. The left ventricle has no regional wall motion abnormalities. Left ventricular diastolic parameters are indeterminate.  2. Right ventricular systolic function is normal. The right ventricular size is normal. There is moderately elevated pulmonary artery systolic pressure. The estimated right ventricular systolic pressure is 18.2 mmHg.  3. The mitral valve is normal in structure. Trivial mitral valve regurgitation. No evidence of mitral stenosis.  4. The aortic valve is normal in structure. Aortic valve regurgitation is not visualized. No aortic stenosis is present.  5. The inferior vena cava is normal in size with <50% respiratory variability, suggesting right atrial pressure of 8 mmHg. FINDINGS  Left Ventricle: Left ventricular ejection fraction, by estimation, is 65 to 70%. The left ventricle has hyperdynamic function. The left ventricle has no regional wall motion abnormalities. The left ventricular internal cavity size was small. There is no  left ventricular hypertrophy. Left ventricular diastolic parameters are indeterminate. Right Ventricle: The right ventricular size is normal. No increase in right ventricular wall thickness. Right ventricular systolic function is normal. There is moderately elevated pulmonary artery systolic pressure. The tricuspid regurgitant velocity is 3.12 m/s, and with an assumed right atrial pressure of 8 mmHg, the estimated right ventricular systolic pressure is 99.3 mmHg. Left Atrium: Left atrial size was normal in size. Right Atrium: Right atrial size was normal in size. Pericardium: There is no evidence of pericardial  effusion. The pericardial effusion  appears to contain Anterior fat pad noted. Mitral Valve: The mitral valve is normal in structure. Normal mobility of the mitral valve leaflets. Trivial mitral valve regurgitation. No evidence of mitral valve stenosis. Tricuspid Valve: The tricuspid valve is normal in structure. Tricuspid valve regurgitation is mild . No evidence of tricuspid stenosis. Aortic Valve: The aortic valve is normal in structure. Aortic valve regurgitation is not visualized. No aortic stenosis is present. Pulmonic Valve: The pulmonic valve was normal in structure. Pulmonic valve regurgitation is trivial. No evidence of pulmonic stenosis. Aorta: The aortic root is normal in size and structure. Venous: The inferior vena cava is normal in size with less than 50% respiratory variability, suggesting right atrial pressure of 8 mmHg. IAS/Shunts: No atrial level shunt detected by color flow Doppler.  LEFT VENTRICLE PLAX 2D LVIDd:         3.60 cm LVIDs:         2.70 cm LV PW:         0.80 cm LV IVS:        0.70 cm LVOT diam:     1.80 cm LV SV:         64 LV SV Index:   35 LVOT Area:     2.54 cm  RIGHT VENTRICLE RV S prime:     11.90 cm/s TAPSE (M-mode): 1.2 cm LEFT ATRIUM             Index       RIGHT ATRIUM           Index LA diam:        3.30 cm 1.79 cm/m  RA Area:     14.70 cm LA Vol (A2C):   25.2 ml 13.68 ml/m RA Volume:   34.50 ml  18.73 ml/m LA Vol (A4C):   41.2 ml 22.37 ml/m LA Biplane Vol: 33.3 ml 18.08 ml/m  AORTIC VALVE LVOT Vmax:   122.00 cm/s LVOT Vmean:  87.000 cm/s LVOT VTI:    0.250 m  AORTA Ao Root diam: 2.70 cm TRICUSPID VALVE TR Peak grad:   38.9 mmHg TR Vmax:        312.00 cm/s  SHUNTS Systemic VTI:  0.25 m Systemic Diam: 1.80 cm Adrian Prows MD Electronically signed by Adrian Prows MD Signature Date/Time: 02/28/2020/6:57:35 PM    Final     EKG: Independently reviewed.  A. fib  Time spent: [facility] Pager [phone]   02/28/2020, 7:27 PM

## 2020-02-28 NOTE — Progress Notes (Signed)
Subjective: Patient reports pain is better.  Objective: Vital signs in last 24 hours: Temp:  [97.6 F (36.4 C)-98.7 F (37.1 C)] 98.7 F (37.1 C) (07/14 1140) Pulse Rate:  [61-103] 103 (07/14 1140) Resp:  [16-23] 23 (07/14 1140) BP: (104-145)/(60-79) 124/79 (07/14 1140) SpO2:  [96 %-97 %] 96 % (07/14 1140)  Intake/Output from previous day: 07/13 0701 - 07/14 0700 In: 303 [P.O.:300; I.V.:3] Out: 400 [Urine:400] Intake/Output this shift: No intake/output data recorded.  3/5 L DF, 4/5 PF, 3/5 KE 4-/5 R DF, 4/5 PF, 2/5 KE Dressings c/d  Lab Results: Recent Labs    02/26/20 0839  HGB 10.2*  HCT 30.0*   BMET Recent Labs    02/26/20 0839  NA 139  K 3.3*    Studies/Results: No results found.  Assessment/Plan: 74 yo F POD#2 s/p T8-12 perc fusion and T10-11 decompression who is doing well, strength improved slightly from preoperatively - will consult medicine for medical issues - cont PT/OT - nutrition, Ensure supplements - lovenox for dvt ppx - resume Xarelto POD # [place] 02/28/2020, 2:02 PM

## 2020-02-28 NOTE — TOC Initial Note (Signed)
Transition of Care [facility]) - Initial/Assessment Note    Patient Details  Name: Crystal Ashley MRN: [id number] Date of Birth: 05-08-1946  Transition of Care [facility]) CM/SW Contact:    Vinie Sill, [place] Phone Number: 02/28/2020, 5:29 PM  Clinical Narrative:                  CSW visit with patient at bedside, along with her son,Prashant. CSW introduced self and explained role. CSW discussed PT recommendation of short term rehab at [facility]. Patient son, states he was agreeable to SNF placement . CSW explained the SNF process. Family preferred SNF is [facility]. Patient's son states no questions or concerns at this time.   [facility] mad offer- CSW will confirm bed availability   Thurmond Butts, MSW, [place] Social Worker    Expected Discharge Plan: Skilled Nursing Facility Barriers to Discharge: SNF Pending bed offer   Patient Goals and CMS Choice        Expected Discharge Plan and Services Expected Discharge Plan: [place] In-house Referral: Clinical Social Work                                            Prior Living Arrangements/Services              Need for Family Participation in Patient Care: Yes (Comment) Care giver support system in place?: Yes (comment)   Criminal Activity/Legal Involvement Pertinent to Current Situation/Hospitalization: No - Comment as needed  Activities of Daily Living Home Assistive Devices/Equipment: None ADL Screening (condition at time of admission) Patient's cognitive ability adequate to safely complete daily activities?: Yes Is the patient deaf or have difficulty hearing?: No Does the patient have difficulty seeing, even when wearing glasses/contacts?: No Does the patient have difficulty concentrating, remembering, or making decisions?: No Patient able to express need for assistance with ADLs?: Yes Does the patient have difficulty dressing or bathing?: No Independently performs ADLs?: Yes  (appropriate for developmental age) Does the patient have difficulty walking or climbing stairs?: No Weakness of Legs: Both Weakness of Arms/Hands: Both  Permission Sought/Granted Permission sought to share information with : Family Supports Permission granted to share information with : Yes, Verbal Permission Granted  Share Information with NAME: Kinsleigh Ludolph  Permission granted to share info w AGENCY: SNFs  Permission granted to share info w Relationship: son  Permission granted to share info w Contact Information: [phone]  Emotional Assessment Appearance:: Appears stated age   Affect (typically observed): Accepting, Pleasant Orientation: : Oriented to Self, Oriented to Place, Oriented to  Time, Oriented to Situation Alcohol / Substance Use: Not Applicable Psych Involvement: No (comment)  Admission diagnosis:  Fusion of spine, thoracolumbar region [M43.25] Patient Active Problem List   Diagnosis Date Noted  . Fusion of spine, thoracolumbar region 02/26/2020  . Acute blood loss anemia   . PAF (paroxysmal atrial fibrillation) ([place])   . Controlled type 2 diabetes mellitus with hyperglycemia, without long-term current use of insulin ([place])   . Fracture of vertebra due to osteoporosis ([place])   . Acute midline thoracic back pain   . Multiple traumatic injuries 02/12/2020  . Closed fracture of multiple pubic rami, right, initial encounter ([place]) 02/07/2020  . Atrial fibrillation, chronic ([place]) 02/07/2020  . Chronic combined systolic (congestive) and diastolic (congestive) heart failure ([place])   . Acute respiratory failure with hypoxia ([place]) 10/18/2018  .  Declining mobility 09/23/2018  . B12 deficiency 09/23/2018  . Morbid obesity with BMI of 40.0-44.9, adult ([place]) 09/23/2018  . Anemia 09/23/2018  . History of cardioversion 04/26/2018  . Type 2 diabetes mellitus without complication, without long-term current use of insulin ([place]) 03/15/2018  . Atrial fibrillation with RVR ([place])  03/15/2018  . Morbid obesity ([place]) 01/04/2017  . Hyperlipidemia associated with type 2 diabetes mellitus ([place]) 01/04/2017  . Vitamin D deficiency 12/15/2016  . Glaucoma   . Hypertension associated with diabetes ([place]) 04/19/2014  . Hypothyroidism 04/19/2014  . History of endometrial cancer 03/30/2014   PCP:  [name], PA Pharmacy:   [place], [place] [address] Phone: [phone] Fax: [phone]  [place], [place] [address] Phone: [phone] Fax: [phone]     Social Determinants of Health (SDOH) Interventions    Readmission Risk Interventions Readmission Risk Prevention Plan 02/08/2020  Transportation Screening Complete  PCP or Specialist Appt within 5-7 Days Complete  Home Care Screening Complete  Medication Review (RN CM) Complete  Some recent data might be hidden

## 2020-02-28 NOTE — Progress Notes (Signed)
Physical Therapy Treatment Patient Details Name: Crystal Ashley MRN: [id number] DOB: 1946-02-15 Today's Date: 02/28/2020    History of Present Illness 74 y.o. female with fall at home with LLE numbness and syncope, low BP resulting from meds.  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  s/p T8-T12  posterior percutaneous instrumentation with cement augmentation and bilateral medial facetectomy for decompression at T10-T11. PMHx:  Afib, DM, CHF    PT Comments    Pt is limited by anxiety during session, refusing attempts at sitting at the edge of bed and requiring max encouragement and education on the needs for bed mobility and exercise to aide in pain reduction. Pt often reporting "I can't, I can't move" but is unable to state why she believes she cannot move at this time. Pt will benefit from continued acute PT services to improve mobility quality and to reduce caregiver burden. Pt may benefit from session with son present as it seems he was able to motivate the pt to mobilize more during the previous PT session. PT continues to recommend SNF placement at this time.   Follow Up Recommendations  SNF;Supervision for mobility/OOB     Equipment Recommendations  Wheelchair (measurements PT);Wheelchair cushion (measurements PT);Hospital bed;Other (comment) (mechanical lift if home today)    Recommendations for Other Services       Precautions / Restrictions Precautions Precautions: Back;Fall Precaution Booklet Issued: No Precaution Comments: PT verbally reviews back precautions, pt unable to recall any Required Braces or Orthoses: Spinal Brace Spinal Brace:  (pt refusing attempts at sitting so brace not utilized) Restrictions Weight Bearing Restrictions: No    Mobility  Bed Mobility Overal bed mobility: Needs Assistance Bed Mobility: Rolling Rolling: Max assist         General bed mobility comments: pt rolls twice to left  and once to right side during session, requires verbal cues for back precautions and log roll. Pt refuses attempts at transitioning from sidelying to sitting  Transfers                    Ambulation/Gait                 Stairs             Wheelchair Mobility    Modified Rankin (Stroke Patients Only)       Balance                                            Cognition Arousal/Alertness: Awake/alert Behavior During Therapy: Anxious Overall Cognitive Status: History of cognitive impairments - at baseline                                 General Comments: pt follows one step commands consistently, requires frequent encouragement, is self-limiting and very anxious, often reports she can't mobilize      Exercises General Exercises - Lower Extremity Ankle Circles/Pumps: AROM;5 reps Quad Sets: AROM;Both;5 reps Gluteal Sets: AROM;Both;5 reps Straight Leg Raises: AROM;Both (3 reps)    General Comments        Pertinent Vitals/Pain Pain Assessment: 0-10 Pain Score: 5  Pain Location: back Pain Descriptors / Indicators: Aching Pain Intervention(s): Monitored during session    Home Living  Prior Function            PT Goals (current goals can now be found in the care plan section) Acute Rehab PT Goals Patient Stated Goal: to get better Progress towards PT goals: Not progressing toward goals - comment (limited by anxiety)    Frequency    Min 3X/week      PT Plan Current plan remains appropriate    Co-evaluation              AM-PAC PT "6 Clicks" Mobility   Outcome Measure  Help needed turning from your back to your side while in a flat bed without using bedrails?: Total Help needed moving from lying on your back to sitting on the side of a flat bed without using bedrails?: Total Help needed moving to and from a bed to a chair (including a wheelchair)?: Total Help needed  standing up from a chair using your arms (e.g., wheelchair or bedside chair)?: Total Help needed to walk in hospital room?: Total Help needed climbing 3-5 steps with a railing? : Total 6 Click Score: 6    End of Session Equipment Utilized During Treatment: Back brace Activity Tolerance: Patient limited by pain;Other (comment) (anxiety) Patient left: in bed;with call bell/phone within reach;with bed alarm set Nurse Communication: Mobility status;Need for lift equipment PT Visit Diagnosis: Muscle weakness (generalized) (M62.81);Pain Pain - part of body:  (back)     Time: 1610-9604 PT Time Calculation (min) (ACUTE ONLY): 29 min  Charges:  $Therapeutic Activity: 23-37 mins                     Zenaida Niece, PT, DPT Acute Rehabilitation Pager: [phone]    Zenaida Niece 02/28/2020, 2:37 PM

## 2020-02-29 DIAGNOSIS — I5042 Chronic combined systolic (congestive) and diastolic (congestive) heart failure: Secondary | ICD-10-CM

## 2020-02-29 DIAGNOSIS — I480 Paroxysmal atrial fibrillation: Secondary | ICD-10-CM

## 2020-02-29 DIAGNOSIS — I10 Essential (primary) hypertension: Secondary | ICD-10-CM

## 2020-02-29 LAB — GLUCOSE, CAPILLARY
Glucose-Capillary: 142 mg/dL — ABNORMAL HIGH (ref 70–99)
Glucose-Capillary: 149 mg/dL — ABNORMAL HIGH (ref 70–99)
Glucose-Capillary: 149 mg/dL — ABNORMAL HIGH (ref 70–99)
Glucose-Capillary: 173 mg/dL — ABNORMAL HIGH (ref 70–99)

## 2020-02-29 MED ORDER — GUAIFENESIN-DM 100-10 MG/5ML PO SYRP
5.0000 mL | ORAL_SOLUTION | ORAL | Status: DC | PRN
  Administered 2020-03-02: 5 mL via ORAL
  Filled 2020-02-29: qty 5

## 2020-02-29 MED ORDER — DILTIAZEM HCL ER COATED BEADS 180 MG PO CP24
180.0000 mg | ORAL_CAPSULE | Freq: Every day | ORAL | Status: DC
  Administered 2020-03-01 – 2020-03-02 (×2): 180 mg via ORAL
  Filled 2020-02-29 (×2): qty 1

## 2020-02-29 NOTE — Progress Notes (Signed)
Orthostatic vitals will be in the comments under her normal sitting vitals.

## 2020-02-29 NOTE — Progress Notes (Signed)
Pt HR fluctuating from 55-70. At one point of the night the HR dipped down to the 30's. Provider on call notified. Verbal order given to hold AM carrdizem. Will continue to monitor.

## 2020-02-29 NOTE — Progress Notes (Signed)
Patient ID: Crystal Ashley, female   DOB: April 25, 1946, 74 y.o.   MRN: [id number]  PROGRESS NOTE    Crystal Ashley  YVO:[id number] DOB: 1945/10/06 DOA: 02/26/2020 PCP: Crystal Coke, PA   Brief Narrative:  74 year old female with history of chronic combined CHF, paroxysmal A. fib on Xarelto, hypertension, diabetes mellitus type 2, recent open reduction of thoracic spine fracture T10-11 by neurosurgery and currently under neurosurgery service.  TRH was consulted on 02/28/2020 for poorly controlled A. fib along with hyperglycemia.  Assessment & Plan:   Paroxysmal A. fib with RVR -Patient is currently on Cardizem 60 mg every 6 hours along with metoprolol 100 mg daily. -Heart rate is better controlled although heart rate dropped to the 30s apparently early this morning when she was sleeping. -Switch patient back to Cardizem 180 mg daily along with metoprolol 100 mg daily.  Spoke to Dr. Faythe Ashley on phone regarding the same and he is agreeable to this and he recommended orthostatic vitals checked.  Outpatient follow-up with Dr. Einar Ashley -Xarelto on hold for now which will be resumed only once clearance has been given by neurosurgery  Diabetes mellitus type II uncontrolled with hyperglycemia -Continue Lantus with CBGs and SSI.  Continue Metformin  Chronic combined systolic and diastolic heart failure -Echo showed EF of 65 to 70%.  Strict input and output.  Daily weights.  Currently euvolemic.  Continue Lasix  Hypertension -Antihypertensive plan as above.  Monitor  Status post open reduction of thoracic spine fracture T10-11 -Care as per primary neurosurgical team  Subjective: Patient seen and examined at bedside.  Denies any current chest pain, nausea, vomiting.  Poor historian.  Son is present at bedside.  Nursing staff reports that patient's heart rate dropped to the 30s early this morning when patient was sleeping but patient was asymptomatic.  Objective: Vitals:   02/29/20  0440 02/29/20 0445 02/29/20 0450 02/29/20 0757  BP:    133/72  Pulse: 68 (!) 46 60 82  Resp: (!) 21 (!) 21 17 (!) 21  Temp:    98.1 F (36.7 C)  TempSrc:    Oral  SpO2: 93% 95% 94% 97%    Intake/Output Summary (Last 24 hours) at 02/29/2020 1018 Last data filed at 02/29/2020 0300 Gross per 24 hour  Intake 3233.5 ml  Output 1250 ml  Net 1983.5 ml   There were no vitals filed for this visit.  Examination:  General exam: Appears calm and comfortable. Respiratory system: Bilateral decreased breath sounds at bases with some scattered crackles Cardiovascular system: S1 & S2 heard, currently rate controlled Gastrointestinal system: Abdomen is nondistended, soft and nontender. Normal bowel sounds heard. Extremities: No cyanosis, clubbing; trace lower extremity edema  Data Reviewed: I have personally reviewed following labs and imaging studies  CBC: Recent Labs  Lab 02/24/20 0001 02/25/20 0830 02/26/20 0839  WBC 9.8 10.3  --   HGB 9.8* 10.9* 10.2*  HCT 33.9* 35.8* 30.0*  MCV 89.9 89.9  --   PLT 278 269  --    Basic Metabolic Panel: Recent Labs  Lab 02/25/20 0830 02/26/20 0839 02/28/20 1508  NA  --  139 139  K  --  3.3* 3.7  CL  --   --  102  CO2  --   --  27  GLUCOSE  --   --  275*  BUN  --   --  15  CREATININE 0.85  --  0.81  CALCIUM  --   --  8.2*  MG  --   --  1.8   GFR: Estimated Creatinine Clearance: 60.1 mL/min (by C-G formula based on SCr of 0.81 mg/dL). Liver Function Tests: No results for input(s): AST, ALT, ALKPHOS, BILITOT, PROT, ALBUMIN in the last 168 hours. No results for input(s): LIPASE, AMYLASE in the last 168 hours. No results for input(s): AMMONIA in the last 168 hours. Coagulation Profile: No results for input(s): INR, PROTIME in the last 168 hours. Cardiac Enzymes: No results for input(s): CKTOTAL, CKMB, CKMBINDEX, TROPONINI in the last 168 hours. BNP (last 3 results) No results for input(s): PROBNP in the last 8760 hours. HbA1C: No  results for input(s): HGBA1C in the last 72 hours. CBG: Recent Labs  Lab 02/28/20 0752 02/28/20 1146 02/28/20 1632 02/28/20 2057 02/29/20 0759  GLUCAP 159* 201* 252* 175* 142*   Lipid Profile: No results for input(s): CHOL, HDL, LDLCALC, TRIG, CHOLHDL, LDLDIRECT in the last 72 hours. Thyroid Function Tests: No results for input(s): TSH, T4TOTAL, FREET4, T3FREE, THYROIDAB in the last 72 hours. Anemia Panel: No results for input(s): VITAMINB12, FOLATE, FERRITIN, TIBC, IRON, RETICCTPCT in the last 72 hours. Sepsis Labs: No results for input(s): PROCALCITON, LATICACIDVEN in the last 168 hours.  Recent Results (from the past 240 hour(s))  Culture, Urine     Status: Abnormal   Collection Time: 02/21/20 11:08 AM   Specimen: Urine, Random  Result Value Ref Range Status   Specimen Description URINE, RANDOM  Final   Special Requests   Final    NONE Performed at [facility], [address] [place], [place] [address]    Culture MULTIPLE SPECIES PRESENT, SUGGEST RECOLLECTION (A)  Final   Report Status 02/23/2020 FINAL  Final  Surgical pcr screen     Status: None   Collection Time: 02/25/20 10:53 AM   Specimen: Nasal Mucosa; Nasal Swab  Result Value Ref Range Status   MRSA, PCR NEGATIVE NEGATIVE Final   Staphylococcus aureus NEGATIVE NEGATIVE Final    Comment: (NOTE) The Xpert SA Assay (FDA approved for NASAL specimens in patients 74 years of age and older), is one component of a comprehensive surveillance program. It is not intended to diagnose infection nor to guide or monitor treatment. Performed at [facility], [place] [address], [place], [place] [address]          Radiology Studies: DG Chest 1 View  Result Date: 02/28/2020 CLINICAL DATA:  Shortness of breath. EXAM: CHEST  1 VIEW COMPARISON:  February 07, 2020. FINDINGS: Stable cardiomediastinal silhouette. No pneumothorax or pleural effusion is noted. Both lungs are clear. Status post surgical posterior fusion of  lower thoracic and lumbar spine. IMPRESSION: No active disease. Electronically Signed   By: Crystal Ashley M.D.   On: 02/28/2020 15:48   ECHOCARDIOGRAM COMPLETE  Result Date: 02/28/2020    ECHOCARDIOGRAM REPORT   Patient Name:   Crystal Ashley Date of Exam: 02/28/2020 Medical Rec #:  [id number]          Height:       60.0 in Accession #:    [id number]         Weight:       193.8 lb Date of Birth:  02/22/1946           BSA:          1.842 m Patient Age:    27 years           BP:           124/74 mmHg Patient Gender: F  HR:           112 bpm. Exam Location:  Inpatient Procedure: 2D Echo Indications:     Atrial Fibrillation I48.91  History:         Patient has prior history of Echocardiogram examinations, most                  recent 10/19/2018. Risk Factors:Hypertension.  Sonographer:     Mikki Santee RDCS (AE) Referring Phys:  [id number] Lequita Halt Diagnosing Phys: Adrian Prows MD IMPRESSIONS  1. Left ventricular ejection fraction, by estimation, is 65 to 70%. The left ventricle has hyperdynamic function. The left ventricle has no regional wall motion abnormalities. Left ventricular diastolic parameters are indeterminate.  2. Right ventricular systolic function is normal. The right ventricular size is normal. There is moderately elevated pulmonary artery systolic pressure. The estimated right ventricular systolic pressure is 49.6 mmHg.  3. The mitral valve is normal in structure. Trivial mitral valve regurgitation. No evidence of mitral stenosis.  4. The aortic valve is normal in structure. Aortic valve regurgitation is not visualized. No aortic stenosis is present.  5. The inferior vena cava is normal in size with <50% respiratory variability, suggesting right atrial pressure of 8 mmHg. FINDINGS  Left Ventricle: Left ventricular ejection fraction, by estimation, is 65 to 70%. The left ventricle has hyperdynamic function. The left ventricle has no regional wall motion abnormalities. The left  ventricular internal cavity size was small. There is no  left ventricular hypertrophy. Left ventricular diastolic parameters are indeterminate. Right Ventricle: The right ventricular size is normal. No increase in right ventricular wall thickness. Right ventricular systolic function is normal. There is moderately elevated pulmonary artery systolic pressure. The tricuspid regurgitant velocity is 3.12 m/s, and with an assumed right atrial pressure of 8 mmHg, the estimated right ventricular systolic pressure is 75.9 mmHg. Left Atrium: Left atrial size was normal in size. Right Atrium: Right atrial size was normal in size. Pericardium: There is no evidence of pericardial effusion. The pericardial effusion appears to contain Anterior fat pad noted. Mitral Valve: The mitral valve is normal in structure. Normal mobility of the mitral valve leaflets. Trivial mitral valve regurgitation. No evidence of mitral valve stenosis. Tricuspid Valve: The tricuspid valve is normal in structure. Tricuspid valve regurgitation is mild . No evidence of tricuspid stenosis. Aortic Valve: The aortic valve is normal in structure. Aortic valve regurgitation is not visualized. No aortic stenosis is present. Pulmonic Valve: The pulmonic valve was normal in structure. Pulmonic valve regurgitation is trivial. No evidence of pulmonic stenosis. Aorta: The aortic root is normal in size and structure. Venous: The inferior vena cava is normal in size with less than 50% respiratory variability, suggesting right atrial pressure of 8 mmHg. IAS/Shunts: No atrial level shunt detected by color flow Doppler.  LEFT VENTRICLE PLAX 2D LVIDd:         3.60 cm LVIDs:         2.70 cm LV PW:         0.80 cm LV IVS:        0.70 cm LVOT diam:     1.80 cm LV SV:         64 LV SV Index:   35 LVOT Area:     2.54 cm  RIGHT VENTRICLE RV S prime:     11.90 cm/s TAPSE (M-mode): 1.2 cm LEFT ATRIUM             Index  RIGHT ATRIUM           Index LA diam:        3.30 cm  1.79 cm/m  RA Area:     14.70 cm LA Vol (A2C):   25.2 ml 13.68 ml/m RA Volume:   34.50 ml  18.73 ml/m LA Vol (A4C):   41.2 ml 22.37 ml/m LA Biplane Vol: 33.3 ml 18.08 ml/m  AORTIC VALVE LVOT Vmax:   122.00 cm/s LVOT Vmean:  87.000 cm/s LVOT VTI:    0.250 m  AORTA Ao Root diam: 2.70 cm TRICUSPID VALVE TR Peak grad:   38.9 mmHg TR Vmax:        312.00 cm/s  SHUNTS Systemic VTI:  0.25 m Systemic Diam: 1.80 cm Adrian Prows MD Electronically signed by Adrian Prows MD Signature Date/Time: 02/28/2020/6:57:35 PM    Final         Scheduled Meds: . atorvastatin  10 mg Oral Daily  . cholecalciferol  5,000 Units Oral Once per day on Sun Mon Wed Fri  . [START ON 03/13/2020] cyanocobalamin  1,000 mcg Intramuscular Q30 days  . diltiazem  60 mg Oral Q6H  . dorzolamide-timolol  1 drop Both Eyes BID  . enoxaparin (LOVENOX) injection  40 mg Subcutaneous Q24H  . feeding supplement (ENSURE ENLIVE)  237 mL Oral BID BM  . furosemide  40 mg Oral Daily  . insulin aspart  0-15 Units Subcutaneous TID WC  . insulin aspart  0-5 Units Subcutaneous QHS  . insulin glargine  10 Units Subcutaneous QHS  . levothyroxine  75 mcg Oral QAC breakfast  . metFORMIN  1,000 mg Oral Q breakfast  . metoprolol succinate  100 mg Oral Daily  . pantoprazole  40 mg Oral Daily  . sodium chloride flush  3 mL Intravenous Q12H  . Travoprost (BAK Free)  1 drop Both Eyes QHS   Continuous Infusions: . sodium chloride    . 0.9 % NaCl with KCl 20 mEq / L Stopped (02/29/20 0037)  . methocarbamol (ROBAXIN) IV            Aline August, MD [facility] 02/29/2020, 10:18 AM

## 2020-02-29 NOTE — Progress Notes (Signed)
Subjective: Patient reports no pain. Had episode of asymptomatic bradycardia this morning.    Objective: Vital signs in last 24 hours: Temp:  [97.6 F (36.4 C)-99.4 F (37.4 C)] 98.2 F (36.8 C) (07/15 1142) Pulse Rate:  [27-148] 79 (07/15 1142) Resp:  [16-47] 20 (07/15 1142) BP: (112-150)/(53-84) 137/80 (07/15 1142) SpO2:  [85 %-100 %] 98 % (07/15 1142)  Intake/Output from previous day: 07/14 0701 - 07/15 0700 In: 3233.5 [I.V.:3233.5] Out: 1250 [Urine:1250] Intake/Output this shift: No intake/output data recorded.  Awake, alert NAD Dressing changed today.  Incisions c/d/i 3/5 L DF, 4/5 PF, 3/5 KE 4-/5 R DF, 4/5 PF, 2/5 KE  Lab Results: No results for input(s): WBC, HGB, HCT, PLT in the last 72 hours. BMET Recent Labs    02/28/20 1508  NA 139  K 3.7  CL 102  CO2 27  GLUCOSE 275*  BUN 15  CREATININE 0.81  CALCIUM 8.2*    Studies/Results: DG Chest 1 View  Result Date: 02/28/2020 CLINICAL DATA:  Shortness of breath. EXAM: CHEST  1 VIEW COMPARISON:  February 07, 2020. FINDINGS: Stable cardiomediastinal silhouette. No pneumothorax or pleural effusion is noted. Both lungs are clear. Status post surgical posterior fusion of lower thoracic and lumbar spine. IMPRESSION: No active disease. Electronically Signed   By: Marijo Conception M.D.   On: 02/28/2020 15:48   ECHOCARDIOGRAM COMPLETE  Result Date: 02/28/2020    ECHOCARDIOGRAM REPORT   Patient Name:   Crystal Ashley Date of Exam: 02/28/2020 Medical Rec #:  [id number]          Height:       60.0 in Accession #:    [id number]         Weight:       193.8 lb Date of Birth:  Nov 14, 1945           BSA:          1.842 m Patient Age:    33 years           BP:           124/74 mmHg Patient Gender: F                  HR:           112 bpm. Exam Location:  Inpatient Procedure: 2D Echo Indications:     Atrial Fibrillation I48.91  History:         Patient has prior history of Echocardiogram examinations, most                  recent  10/19/2018. Risk Factors:Hypertension.  Sonographer:     Mikki Santee RDCS (AE) Referring Phys:  [id number] Lequita Halt Diagnosing Phys: Adrian Prows MD IMPRESSIONS  1. Left ventricular ejection fraction, by estimation, is 65 to 70%. The left ventricle has hyperdynamic function. The left ventricle has no regional wall motion abnormalities. Left ventricular diastolic parameters are indeterminate.  2. Right ventricular systolic function is normal. The right ventricular size is normal. There is moderately elevated pulmonary artery systolic pressure. The estimated right ventricular systolic pressure is 00.1 mmHg.  3. The mitral valve is normal in structure. Trivial mitral valve regurgitation. No evidence of mitral stenosis.  4. The aortic valve is normal in structure. Aortic valve regurgitation is not visualized. No aortic stenosis is present.  5. The inferior vena cava is normal in size with <50% respiratory variability, suggesting right atrial pressure of 8 mmHg. FINDINGS  Left Ventricle: Left  ventricular ejection fraction, by estimation, is 65 to 70%. The left ventricle has hyperdynamic function. The left ventricle has no regional wall motion abnormalities. The left ventricular internal cavity size was small. There is no  left ventricular hypertrophy. Left ventricular diastolic parameters are indeterminate. Right Ventricle: The right ventricular size is normal. No increase in right ventricular wall thickness. Right ventricular systolic function is normal. There is moderately elevated pulmonary artery systolic pressure. The tricuspid regurgitant velocity is 3.12 m/s, and with an assumed right atrial pressure of 8 mmHg, the estimated right ventricular systolic pressure is 99.3 mmHg. Left Atrium: Left atrial size was normal in size. Right Atrium: Right atrial size was normal in size. Pericardium: There is no evidence of pericardial effusion. The pericardial effusion appears to contain Anterior fat pad noted. Mitral Valve:  The mitral valve is normal in structure. Normal mobility of the mitral valve leaflets. Trivial mitral valve regurgitation. No evidence of mitral valve stenosis. Tricuspid Valve: The tricuspid valve is normal in structure. Tricuspid valve regurgitation is mild . No evidence of tricuspid stenosis. Aortic Valve: The aortic valve is normal in structure. Aortic valve regurgitation is not visualized. No aortic stenosis is present. Pulmonic Valve: The pulmonic valve was normal in structure. Pulmonic valve regurgitation is trivial. No evidence of pulmonic stenosis. Aorta: The aortic root is normal in size and structure. Venous: The inferior vena cava is normal in size with less than 50% respiratory variability, suggesting right atrial pressure of 8 mmHg. IAS/Shunts: No atrial level shunt detected by color flow Doppler.  LEFT VENTRICLE PLAX 2D LVIDd:         3.60 cm LVIDs:         2.70 cm LV PW:         0.80 cm LV IVS:        0.70 cm LVOT diam:     1.80 cm LV SV:         64 LV SV Index:   35 LVOT Area:     2.54 cm  RIGHT VENTRICLE RV S prime:     11.90 cm/s TAPSE (M-mode): 1.2 cm LEFT ATRIUM             Index       RIGHT ATRIUM           Index LA diam:        3.30 cm 1.79 cm/m  RA Area:     14.70 cm LA Vol (A2C):   25.2 ml 13.68 ml/m RA Volume:   34.50 ml  18.73 ml/m LA Vol (A4C):   41.2 ml 22.37 ml/m LA Biplane Vol: 33.3 ml 18.08 ml/m  AORTIC VALVE LVOT Vmax:   122.00 cm/s LVOT Vmean:  87.000 cm/s LVOT VTI:    0.250 m  AORTA Ao Root diam: 2.70 cm TRICUSPID VALVE TR Peak grad:   38.9 mmHg TR Vmax:        312.00 cm/s  SHUNTS Systemic VTI:  0.25 m Systemic Diam: 1.80 cm Adrian Prows MD Electronically signed by Adrian Prows MD Signature Date/Time: 02/28/2020/6:57:35 PM    Final     Assessment/Plan: 74 yo F s/p thoracolumbar decompression and fusion, doing well.  - awaiting SNF placement - resume Xarelto 7/19 - cont lovenox for dvt ppx - cont PT/OT - appreciate IM recs/mgmt   [name] 02/29/2020, 1:28  PM

## 2020-03-01 LAB — GLUCOSE, CAPILLARY
Glucose-Capillary: 138 mg/dL — ABNORMAL HIGH (ref 70–99)
Glucose-Capillary: 255 mg/dL — ABNORMAL HIGH (ref 70–99)
Glucose-Capillary: 91 mg/dL (ref 70–99)
Glucose-Capillary: 145 mg/dL — ABNORMAL HIGH (ref 70–99)

## 2020-03-01 LAB — SARS CORONAVIRUS 2 BY RT PCR (HOSPITAL ORDER, PERFORMED IN ~~LOC~~ HOSPITAL LAB): SARS Coronavirus 2: NEGATIVE

## 2020-03-01 MED ORDER — INSULIN GLARGINE 100 UNIT/ML ~~LOC~~ SOLN: 10.0000 [IU] | Freq: Every day | SUBCUTANEOUS | 11 refills | Status: DC

## 2020-03-01 MED ORDER — OXYCODONE-ACETAMINOPHEN 5-325 MG PO TABS: 1.0000 | ORAL_TABLET | Freq: Four times a day (QID) | ORAL | 0 refills | Status: DC | PRN

## 2020-03-01 MED ORDER — PANTOPRAZOLE SODIUM 40 MG PO TBEC: 40.0000 mg | DELAYED_RELEASE_TABLET | Freq: Every day | ORAL | 2 refills | Status: DC

## 2020-03-01 MED ORDER — METHOCARBAMOL 500 MG PO TABS: 500.0000 mg | ORAL_TABLET | Freq: Four times a day (QID) | ORAL | 0 refills | Status: DC | PRN

## 2020-03-01 MED ORDER — INSULIN ASPART 100 UNIT/ML ~~LOC~~ SOLN: 0.0000 [IU] | Freq: Three times a day (TID) | SUBCUTANEOUS | 11 refills | Status: DC

## 2020-03-01 MED ORDER — ENSURE ENLIVE PO LIQD: 237.0000 mL | Freq: Two times a day (BID) | ORAL | 12 refills | Status: DC

## 2020-03-01 MED ORDER — INSULIN ASPART 100 UNIT/ML ~~LOC~~ SOLN: 0.0000 [IU] | Freq: Every day | SUBCUTANEOUS | 11 refills | Status: DC

## 2020-03-01 NOTE — Progress Notes (Signed)
Physical Therapy Treatment Patient Details Name: Crystal Ashley MRN: [id number] DOB: September 26, 1945 Today's Date: 03/01/2020    History of Present Illness 74 y.o. female with fall at home with LLE numbness and syncope, low BP resulting from meds.  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  s/p T8-T12  posterior percutaneous instrumentation with cement augmentation and bilateral medial facetectomy for decompression at T10-T11. PMHx:  Afib, DM, CHF    PT Comments    Pt remains a limited participant in PT intervention. This PT attempts to wait to perform session while family member is present for added encouragement however PT makes multiple passes by room throughout the day however no family members present. Pt continues to decline attempts at functional mobility due to pain and is not open to education on the benefits of gradual mobility for pain reduction. Pt eventually becomes agreeable to minimal LE exercise with max PT encouragement. PT reducing frequency at this time as pt has been a very limited participant with acute services. PT continues to recommend SNF placement as the pt has been unable to stand since admission and is not able to safely discharge home at this time.  Follow Up Recommendations  SNF;Supervision for mobility/OOB     Equipment Recommendations  Wheelchair (measurements PT);Wheelchair cushion (measurements PT);Hospital bed;Other (comment)    Recommendations for Other Services       Precautions / Restrictions Precautions Precautions: Back;Fall Precaution Booklet Issued: No Precaution Comments: pt not receptive to education this session Required Braces or Orthoses: Spinal Brace Spinal Brace: Thoracolumbosacral orthotic Spinal Brace Comments: not utilized as pt refusing bed mobility or upright activity Restrictions Weight Bearing Restrictions: No    Mobility  Bed Mobility Overal bed mobility: Needs  Assistance Bed Mobility: Rolling           General bed mobility comments: PT attempts to initiate a log roll, assisting pt to bend knees however ot refuses to reach across with UE to roll and then straightens both knees each attempt  Transfers                    Ambulation/Gait                 Stairs             Wheelchair Mobility    Modified Rankin (Stroke Patients Only)       Balance                                            Cognition Arousal/Alertness: Awake/alert Behavior During Therapy: Flat affect Overall Cognitive Status: History of cognitive impairments - at baseline                                        Exercises General Exercises - Lower Extremity Ankle Circles/Pumps: AROM;Both;10 reps Quad Sets: AROM;Both;10 reps Gluteal Sets: AROM;Both;10 reps Heel Slides: AAROM;Both;5 reps Hip ABduction/ADduction: AAROM;Both;10 reps    General Comments        Pertinent Vitals/Pain Pain Assessment: 0-10 Pain Score: 7  Pain Location: back Pain Descriptors / Indicators: Aching Pain Intervention(s): Monitored during session    Home Living  Prior Function            PT Goals (current goals can now be found in the care plan section) Acute Rehab PT Goals Patient Stated Goal: to reduce pain Progress towards PT goals: Not progressing toward goals - comment (limited participant)    Frequency    Min 2X/week      PT Plan Frequency needs to be updated    Co-evaluation              AM-PAC PT "6 Clicks" Mobility   Outcome Measure  Help needed turning from your back to your side while in a flat bed without using bedrails?: Total Help needed moving from lying on your back to sitting on the side of a flat bed without using bedrails?: Total Help needed moving to and from a bed to a chair (including a wheelchair)?: Total Help needed standing up from a chair using  your arms (e.g., wheelchair or bedside chair)?: Total Help needed to walk in hospital room?: Total Help needed climbing 3-5 steps with a railing? : Total 6 Click Score: 6    End of Session   Activity Tolerance: Patient limited by pain Patient left: in bed;with call bell/phone within reach;with bed alarm set Nurse Communication: Mobility status;Need for lift equipment PT Visit Diagnosis: Muscle weakness (generalized) (M62.81);Pain Pain - part of body:  (back)     Time: 9509-3267 PT Time Calculation (min) (ACUTE ONLY): 21 min  Charges:  $Therapeutic Exercise: 8-22 mins                     Zenaida Niece, PT, DPT Acute Rehabilitation Pager: [phone]    Zenaida Niece 03/01/2020, 3:02 PM

## 2020-03-01 NOTE — H&P (Signed)
Subjective:  Crystal Ashley is a 74 y.o. female With diabetes, atrial fibrillation who suffered a fall with pelvic fractures and T10-11 fractures.  She was admitted to rehab but developed worsening back pain, as well as leg weakness.   Due to her  Poorly-controlled pain as well as worsened weakness, surgical decompression with fusion was recommended. Appropriate clearances were obtained and her Xarelto was stopped in anticipation of this surgery.    Patient Active Problem List   Diagnosis Date Noted   Elective surgery    Fusion of spine, thoracolumbar region 02/26/2020   Acute blood loss anemia    PAF (paroxysmal atrial fibrillation) (HCC)    Controlled type 2 diabetes mellitus with hyperglycemia, without long-term current use of insulin ([place])    Fracture of vertebra due to osteoporosis ([place])    Acute midline thoracic back pain    Multiple traumatic injuries 02/12/2020   Closed fracture of multiple pubic rami, right, initial encounter ([place]) 02/07/2020   Atrial fibrillation, chronic ([place]) 02/07/2020   Chronic combined systolic (congestive) and diastolic (congestive) heart failure ([place])    Acute respiratory failure with hypoxia ([place]) 10/18/2018   Declining mobility 09/23/2018   B12 deficiency 09/23/2018   Morbid obesity with BMI of 40.0-44.9, adult ([place]) 09/23/2018   Anemia 09/23/2018   History of cardioversion 04/26/2018   Type 2 diabetes mellitus without complication, without long-term current use of insulin ([place]) 03/15/2018   Atrial fibrillation with RVR ([place]) 03/15/2018   Morbid obesity ([place]) 01/04/2017   Hyperlipidemia associated with type 2 diabetes mellitus ([place]) 01/04/2017   Vitamin D deficiency 12/15/2016   Glaucoma    Hypertension associated with diabetes ([place]) 04/19/2014   Hypothyroidism 04/19/2014   History of endometrial cancer 03/30/2014   Past Medical History:  Diagnosis Date   Atrial fibrillation ([place])    Cancer ([place])    Cataract    Fracture 2001   left ankle    Glaucoma    Hypertension    Thyroid disease     Past Surgical History:  Procedure Laterality Date   APPLICATION OF ROBOTIC ASSISTANCE FOR SPINAL PROCEDURE N/A 02/26/2020   Procedure: APPLICATION OF ROBOTIC ASSISTANCE FOR SPINAL PROCEDURE;  Surgeon: [name], MD;  Location: [facility];  Service: Neurosurgery;  Laterality: N/A;   CARDIOVERSION N/A 04/08/2018   Procedure: CARDIOVERSION;  Surgeon: Adrian Prows, MD;  Location: [facility];  Service: Cardiovascular;  Laterality: N/A;   CARDIOVERSION N/A 05/22/2019   Procedure: CARDIOVERSION;  Surgeon: Adrian Prows, MD;  Location: [facility];  Service: Cardiovascular;  Laterality: N/A;   FOOT SURGERY Left    HYSTEROSCOPY     POLYPECTOMY   LUMBAR PERCUTANEOUS PEDICLE SCREW 4 LEVEL N/A 02/26/2020   Procedure: Thoracic eight to thoracic twelve posterior percutaneous instrumentation with cement augmentation and bilateral medial facetectomy for decompression at Thoracic ten-eleven;  Surgeon: [name], MD;  Location: [facility];  Service: Neurosurgery;  Laterality: N/A;    Medications Prior to Admission  Medication Sig Dispense Refill Last Dose   atorvastatin (LIPITOR) 10 MG tablet Take 1 tablet (10 mg total) by mouth daily. 90 tablet 3    cholecalciferol (VITAMIN D) 1000 UNITS tablet Take 5,000 Units by mouth See admin instructions. Sun, Mon, Wed, friday      cyanocobalamin (,VITAMIN B-12,) 1000 MCG/ML injection 1000 mcg (1 mg) injection once per week for four weeks, followed by 1000 mcg injection once per month. (Patient taking differently: Inject 1,000 mcg into the muscle every 30 (thirty) days. ) 30 mL  3    diltiazem (CARDIZEM CD) 180 MG 24 hr capsule Take 1 capsule (180 mg total) by mouth daily. 30 capsule 2    dorzolamide-timolol (COSOPT) 22.3-6.8 MG/ML ophthalmic solution Place 1 drop into both eyes 2 (two) times daily.       furosemide (LASIX) 40 MG tablet Take 1 tablet (40 mg total) by mouth daily. 90 tablet 1    levothyroxine  (SYNTHROID) 75 MCG tablet Take 1 tablet (75 mcg total) by mouth daily before breakfast. 90 tablet 3    metFORMIN (GLUCOPHAGE) 1000 MG tablet Take 1 tablet (1,000 mg total) by mouth daily with breakfast. 90 tablet 3    metoprolol succinate (TOPROL-XL) 100 MG 24 hr tablet Take 1 tablet (100 mg total) by mouth daily. Take with or immediately following a meal. 90 tablet 1    polyethylene glycol (MIRALAX / GLYCOLAX) 17 g packet Take 17 g by mouth daily as needed for mild constipation. 14 each 0    Rivaroxaban (XARELTO) 15 MG TABS tablet Take 1 tablet (15 mg total) by mouth daily with supper. (Patient taking differently: Take 15 mg by mouth daily. ) 90 tablet 2    travoprost, benzalkonium, (TRAVATAN) 0.004 % ophthalmic solution Place 1 drop into both eyes at bedtime.       No Known Allergies  Social History   Tobacco Use   Smoking status: Never Smoker   Smokeless tobacco: Never Used  Substance Use Topics   Alcohol use: No    Family History  Problem Relation Age of Onset   Diabetes Son    Healthy Mother    Healthy Father     Review of Systems Pertinent items are noted in HPI.  Objective:  Obese, NAD Breathing comfortably Pulse irregular Abd soft 1/5 HF, KE, 3/5 DF and PF Ext wwp  Reviewed preop films  Assessment:   T9/T10 osteoporotic fractures with leg weakness   Plan:   Plan for decompression and stabilization to help optimize functional outcome

## 2020-03-01 NOTE — Discharge Instructions (Signed)
Ok to shower Please place gauze dressing over incisions daily OOB with TLSO brace No heavy lifting >10 lbs No excessive bending/twisting at the waist

## 2020-03-01 NOTE — TOC Progression Note (Addendum)
Transition of Care [facility]) - Progression Note    Patient Details  Name: Crystal Ashley MRN: [id number] Date of Birth: April 10, 1946  Transition of Care [facility]) CM/SW [place], [place] Phone Number: 03/01/2020, 2:06 PM  Clinical Narrative:    CSW spoke with [facility] and confirmed patient is able to discharge to the facility today, pending covid and dc summary is received by 4p.  3p Update: CSW contacted by [facility] for an update on dc summary and covid test. CSW informed Crystal Ashley that neither are yet completed. Patient is able to discharge to [facility] Saturday morning.  CSW contacted patient's son Crystal Ashley and provided him with an update.  Expected Discharge Plan: Skilled Nursing Facility Barriers to Discharge: SNF Pending bed offer  Expected Discharge Plan and Services Expected Discharge Plan: [place] In-house Referral: Clinical Social Work                                             Social Determinants of Health (SDOH) Interventions    Readmission Risk Interventions Readmission Risk Prevention Plan 02/08/2020  Transportation Screening Complete  PCP or Specialist Appt within 5-7 Days Complete  Home Care Screening Complete  Medication Review (RN CM) Complete  Some recent data might be hidden

## 2020-03-01 NOTE — Progress Notes (Signed)
Neurosurgery  Pt seen + examined.  Lower extremity exam stable- 2/5 strength KE, 4/5 DF, PF.  Incisions c/d. Abd pad taped on.  - likely SNF transfer today

## 2020-03-01 NOTE — Progress Notes (Signed)
Patient ID: Crystal Ashley, female   DOB: Nov 22, 1945, 74 y.o.   MRN: [id number]  PROGRESS NOTE    Crystal Ashley  WUJ:[id number] DOB: 1946-08-08 DOA: 02/26/2020 PCP: Crystal Coke, PA   Brief Narrative:  74 year old female with history of chronic combined CHF, paroxysmal A. fib on Xarelto, hypertension, diabetes mellitus type 2, recent open reduction of thoracic spine fracture T10-11 by neurosurgery and currently under neurosurgery service.  TRH was consulted on 02/28/2020 for poorly controlled A. fib along with hyperglycemia.  Assessment & Plan:   Paroxysmal A. fib with RVR -Currently rate is better controlled. -Heart rate is better controlled although heart rate drops to the 40s when she sleeps -Continue Cardizem 180 mg daily and metoprolol 100 mg daily.  Outpatient follow-up with Dr. Einar Ashley -Xarelto on hold for now which will be resumed only once clearance has been given by neurosurgery -Patient is currently medically stable for discharge.  Diabetes mellitus type II uncontrolled with hyperglycemia -Continue Lantus with CBGs and SSI.  Continue Metformin  Chronic combined systolic and diastolic heart failure -Echo showed EF of 65 to 70%.  Strict input and output.  Daily weights.  Currently euvolemic.  Continue Lasix  Hypertension -Antihypertensive plan as above.  Monitor  Status post open reduction of thoracic spine fracture T10-11 -Care as per primary neurosurgical team  Subjective: Patient seen and examined at bedside.  Denies worsening shortness breath, chest pain, nausea, vomiting or fever. Objective: Vitals:   02/29/20 1623 02/29/20 1957 02/29/20 2321 03/01/20 0337  BP: 122/73 114/73 116/72 120/73  Pulse: 87 83 77 85  Resp: 20 20 15 19   Temp: 98.3 F (36.8 C) 98.6 F (37 C) 97.6 F (36.4 C) 98.3 F (36.8 C)  TempSrc: Oral Oral Oral Axillary  SpO2: 97% 97% 95% 99%    Intake/Output Summary (Last 24 hours) at 03/01/2020 0721 Last data filed at 03/01/2020  0600 Gross per 24 hour  Intake 600 ml  Output 1200 ml  Net -600 ml   There were no vitals filed for this visit.  Examination:  General exam: No distress.  Poor historian. Respiratory system: Bilateral decreased breath sounds at bases with no wheezing cardiovascular system: Rate controlled, S1-S2 heard Gastrointestinal system: Abdomen is nondistended, soft and nontender.  Bowel sounds are heard  extremities: Bilateral trace lower extremity edema; no clubbing  Data Reviewed: I have personally reviewed following labs and imaging studies  CBC: Recent Labs  Lab 02/24/20 0001 02/25/20 0830 02/26/20 0839  WBC 9.8 10.3  --   HGB 9.8* 10.9* 10.2*  HCT 33.9* 35.8* 30.0*  MCV 89.9 89.9  --   PLT 278 269  --    Basic Metabolic Panel: Recent Labs  Lab 02/25/20 0830 02/26/20 0839 02/28/20 1508  NA  --  139 139  K  --  3.3* 3.7  CL  --   --  102  CO2  --   --  27  GLUCOSE  --   --  275*  BUN  --   --  15  CREATININE 0.85  --  0.81  CALCIUM  --   --  8.2*  MG  --   --  1.8   GFR: Estimated Creatinine Clearance: 60.1 mL/min (by C-G formula based on SCr of 0.81 mg/dL). Liver Function Tests: No results for input(s): AST, ALT, ALKPHOS, BILITOT, PROT, ALBUMIN in the last 168 hours. No results for input(s): LIPASE, AMYLASE in the last 168 hours. No results for input(s): AMMONIA in the last 168  hours. Coagulation Profile: No results for input(s): INR, PROTIME in the last 168 hours. Cardiac Enzymes: No results for input(s): CKTOTAL, CKMB, CKMBINDEX, TROPONINI in the last 168 hours. BNP (last 3 results) No results for input(s): PROBNP in the last 8760 hours. HbA1C: No results for input(s): HGBA1C in the last 72 hours. CBG: Recent Labs  Lab 02/28/20 2057 02/29/20 0759 02/29/20 1141 02/29/20 1622 02/29/20 2153  GLUCAP 175* 142* 149* 149* 173*   Lipid Profile: No results for input(s): CHOL, HDL, LDLCALC, TRIG, CHOLHDL, LDLDIRECT in the last 72 hours. Thyroid Function  Tests: No results for input(s): TSH, T4TOTAL, FREET4, T3FREE, THYROIDAB in the last 72 hours. Anemia Panel: No results for input(s): VITAMINB12, FOLATE, FERRITIN, TIBC, IRON, RETICCTPCT in the last 72 hours. Sepsis Labs: No results for input(s): PROCALCITON, LATICACIDVEN in the last 168 hours.  Recent Results (from the past 240 hour(s))  Culture, Urine     Status: Abnormal   Collection Time: 02/21/20 11:08 AM   Specimen: Urine, Random  Result Value Ref Range Status   Specimen Description URINE, RANDOM  Final   Special Requests   Final    NONE Performed at [facility], [address], [place], [place] [address]    Culture MULTIPLE SPECIES PRESENT, SUGGEST RECOLLECTION (A)  Final   Report Status 02/23/2020 FINAL  Final  Surgical pcr screen     Status: None   Collection Time: 02/25/20 10:53 AM   Specimen: Nasal Mucosa; Nasal Swab  Result Value Ref Range Status   MRSA, PCR NEGATIVE NEGATIVE Final   Staphylococcus aureus NEGATIVE NEGATIVE Final    Comment: (NOTE) The Xpert SA Assay (FDA approved for NASAL specimens in patients 36 years of age and older), is one component of a comprehensive surveillance program. It is not intended to diagnose infection nor to guide or monitor treatment. Performed at [facility], [place] [address] [place], [place] [address]          Radiology Studies: DG Chest 1 View  Result Date: 02/28/2020 CLINICAL DATA:  Shortness of breath. EXAM: CHEST  1 VIEW COMPARISON:  February 07, 2020. FINDINGS: Stable cardiomediastinal silhouette. No pneumothorax or pleural effusion is noted. Both lungs are clear. Status post surgical posterior fusion of lower thoracic and lumbar spine. IMPRESSION: No active disease. Electronically Signed   By: Marijo Conception M.D.   On: 02/28/2020 15:48   ECHOCARDIOGRAM COMPLETE  Result Date: 02/28/2020    ECHOCARDIOGRAM REPORT   Patient Name:   Crystal Ashley Date of Exam: 02/28/2020 Medical Rec #:  [id number]           Height:       60.0 in Accession #:    [id number]         Weight:       193.8 lb Date of Birth:  Jun 09, 1946           BSA:          1.842 m Patient Age:    84 years           BP:           124/74 mmHg Patient Gender: F                  HR:           112 bpm. Exam Location:  Inpatient Procedure: 2D Echo Indications:     Atrial Fibrillation I48.91  History:         Patient has  prior history of Echocardiogram examinations, most                  recent 10/19/2018. Risk Factors:Hypertension.  Sonographer:     Mikki Santee RDCS (AE) Referring Phys:  [id number] Lequita Halt Diagnosing Phys: Adrian Prows MD IMPRESSIONS  1. Left ventricular ejection fraction, by estimation, is 65 to 70%. The left ventricle has hyperdynamic function. The left ventricle has no regional wall motion abnormalities. Left ventricular diastolic parameters are indeterminate.  2. Right ventricular systolic function is normal. The right ventricular size is normal. There is moderately elevated pulmonary artery systolic pressure. The estimated right ventricular systolic pressure is 23.5 mmHg.  3. The mitral valve is normal in structure. Trivial mitral valve regurgitation. No evidence of mitral stenosis.  4. The aortic valve is normal in structure. Aortic valve regurgitation is not visualized. No aortic stenosis is present.  5. The inferior vena cava is normal in size with <50% respiratory variability, suggesting right atrial pressure of 8 mmHg. FINDINGS  Left Ventricle: Left ventricular ejection fraction, by estimation, is 65 to 70%. The left ventricle has hyperdynamic function. The left ventricle has no regional wall motion abnormalities. The left ventricular internal cavity size was small. There is no  left ventricular hypertrophy. Left ventricular diastolic parameters are indeterminate. Right Ventricle: The right ventricular size is normal. No increase in right ventricular wall thickness. Right ventricular systolic function is normal. There is moderately  elevated pulmonary artery systolic pressure. The tricuspid regurgitant velocity is 3.12 m/s, and with an assumed right atrial pressure of 8 mmHg, the estimated right ventricular systolic pressure is 36.1 mmHg. Left Atrium: Left atrial size was normal in size. Right Atrium: Right atrial size was normal in size. Pericardium: There is no evidence of pericardial effusion. The pericardial effusion appears to contain Anterior fat pad noted. Mitral Valve: The mitral valve is normal in structure. Normal mobility of the mitral valve leaflets. Trivial mitral valve regurgitation. No evidence of mitral valve stenosis. Tricuspid Valve: The tricuspid valve is normal in structure. Tricuspid valve regurgitation is mild . No evidence of tricuspid stenosis. Aortic Valve: The aortic valve is normal in structure. Aortic valve regurgitation is not visualized. No aortic stenosis is present. Pulmonic Valve: The pulmonic valve was normal in structure. Pulmonic valve regurgitation is trivial. No evidence of pulmonic stenosis. Aorta: The aortic root is normal in size and structure. Venous: The inferior vena cava is normal in size with less than 50% respiratory variability, suggesting right atrial pressure of 8 mmHg. IAS/Shunts: No atrial level shunt detected by color flow Doppler.  LEFT VENTRICLE PLAX 2D LVIDd:         3.60 cm LVIDs:         2.70 cm LV PW:         0.80 cm LV IVS:        0.70 cm LVOT diam:     1.80 cm LV SV:         64 LV SV Index:   35 LVOT Area:     2.54 cm  RIGHT VENTRICLE RV S prime:     11.90 cm/s TAPSE (M-mode): 1.2 cm LEFT ATRIUM             Index       RIGHT ATRIUM           Index LA diam:        3.30 cm 1.79 cm/m  RA Area:     14.70 cm LA Vol (A2C):  25.2 ml 13.68 ml/m RA Volume:   34.50 ml  18.73 ml/m LA Vol (A4C):   41.2 ml 22.37 ml/m LA Biplane Vol: 33.3 ml 18.08 ml/m  AORTIC VALVE LVOT Vmax:   122.00 cm/s LVOT Vmean:  87.000 cm/s LVOT VTI:    0.250 m  AORTA Ao Root diam: 2.70 cm TRICUSPID VALVE TR Peak  grad:   38.9 mmHg TR Vmax:        312.00 cm/s  SHUNTS Systemic VTI:  0.25 m Systemic Diam: 1.80 cm Adrian Prows MD Electronically signed by Adrian Prows MD Signature Date/Time: 02/28/2020/6:57:35 PM    Final         Scheduled Meds: . atorvastatin  10 mg Oral Daily  . cholecalciferol  5,000 Units Oral Once per day on Sun Mon Wed Fri  . [START ON 03/13/2020] cyanocobalamin  1,000 mcg Intramuscular Q30 days  . diltiazem  180 mg Oral Daily  . dorzolamide-timolol  1 drop Both Eyes BID  . enoxaparin (LOVENOX) injection  40 mg Subcutaneous Q24H  . feeding supplement (ENSURE ENLIVE)  237 mL Oral BID BM  . furosemide  40 mg Oral Daily  . insulin aspart  0-15 Units Subcutaneous TID WC  . insulin aspart  0-5 Units Subcutaneous QHS  . insulin glargine  10 Units Subcutaneous QHS  . levothyroxine  75 mcg Oral QAC breakfast  . metFORMIN  1,000 mg Oral Q breakfast  . metoprolol succinate  100 mg Oral Daily  . pantoprazole  40 mg Oral Daily  . sodium chloride flush  3 mL Intravenous Q12H  . Travoprost (BAK Free)  1 drop Both Eyes QHS   Continuous Infusions: . sodium chloride    . methocarbamol (ROBAXIN) IV            Aline August, MD [facility] 03/01/2020, 7:21 AM

## 2020-03-01 NOTE — Discharge Summary (Addendum)
Physician Discharge Summary  Patient ID: Crystal Ashley MRN: [id number] DOB/AGE: 1946/07/04 74 y.o.  Admit date: 02/26/2020 Discharge date: 03/01/2020  Admission Diagnoses:  T10, T11 fractures  Discharge Diagnoses:  Same Active Problems:   Fusion of spine, thoracolumbar region   Elective surgery   Discharged Condition: Stable  Hospital Course:  Crystal Ashley is a 74 y.o. female With diabetes, atrial fibrillation who suffered a fall with pelvic fractures and T10-11 fractures.  She was admitted to rehab but developed worsening back pain, as well as leg weakness.   Due to her  Poorly-controlled pain as well as worsened weakness, surgical decompression with fusion was recommended.  She underwent the surgery on 02/26/2020 which she tolerated well.  Postoperatively, she had  Significant improvement in her back pain as well assome improvement in her leg strength but still had  Difficulty mobilizing out of bed.  Given the need for longer-term therapies and support, patient's family elected to have the patient transferred to a SNF.  The Internal Medicine Services consult and for some of her fluctuations in her heart rate and for diabetes control.  Treatments: Surgery -   T8-T12  Percutaneous posterior instrumented  Fusion, T10-11 posterior decompression.  Discharge Exam: Blood pressure 116/64, pulse 91, temperature 97.9 F (36.6 C), temperature source Axillary, resp. rate 19, SpO2 98 %. Awake, alert, oriented Speech fluent, appropriate CN grossly intact 5/5 BUE/   2/5 strength in hip flexion, knee extension bilaterally.  4/5 strength in dorsiflexion and plantar flexion bilaterally.  Sensory grossly intact, no sensory level. Wound c/d/i  Disposition: Discharge disposition: 03-Skilled Nursing Facility       Discharge Instructions    Incentive spirometry RT   Complete by: As directed      Allergies as of 03/01/2020   No Known Allergies     Medication List    TAKE these  medications   atorvastatin 10 MG tablet Commonly known as: LIPITOR Take 1 tablet (10 mg total) by mouth daily.   cholecalciferol 1000 units tablet Commonly known as: VITAMIN D Take 5,000 Units by mouth See admin instructions. Sun, Mon, Wed, friday   cyanocobalamin 1000 MCG/ML injection Commonly known as: (VITAMIN B-12) 1000 mcg (1 mg) injection once per week for four weeks, followed by 1000 mcg injection once per month. What changed:   how much to take  how to take this  when to take this  additional instructions   diltiazem 180 MG 24 hr capsule Commonly known as: CARDIZEM CD Take 1 capsule (180 mg total) by mouth daily.   dorzolamide-timolol 22.3-6.8 MG/ML ophthalmic solution Commonly known as: COSOPT Place 1 drop into both eyes 2 (two) times daily.   feeding supplement (ENSURE ENLIVE) Liqd Take 237 mLs by mouth 2 (two) times daily between meals.   furosemide 40 MG tablet Commonly known as: LASIX Take 1 tablet (40 mg total) by mouth daily.   insulin aspart 100 UNIT/ML injection Commonly known as: novoLOG Inject 0-15 Units into the skin 3 (three) times daily with meals.   insulin aspart 100 UNIT/ML injection Commonly known as: novoLOG Inject 0-5 Units into the skin at bedtime.   insulin glargine 100 UNIT/ML injection Commonly known as: LANTUS Inject 0.1 mLs (10 Units total) into the skin at bedtime.   levothyroxine 75 MCG tablet Commonly known as: SYNTHROID Take 1 tablet (75 mcg total) by mouth daily before breakfast.   metFORMIN 1000 MG tablet Commonly known as: GLUCOPHAGE Take 1 tablet (1,000 mg total) by mouth daily  with breakfast.   methocarbamol 500 MG tablet Commonly known as: ROBAXIN Take 1 tablet (500 mg total) by mouth every 6 (six) hours as needed for muscle spasms.   metoprolol succinate 100 MG 24 hr tablet Commonly known as: TOPROL-XL Take 1 tablet (100 mg total) by mouth daily. Take with or immediately following a meal.    oxyCODONE-acetaminophen 5-325 MG tablet Commonly known as: PERCOCET/ROXICET Take 1-2 tablets by mouth every 6 (six) hours as needed for moderate pain or severe pain.   pantoprazole 40 MG tablet Commonly known as: PROTONIX Take 1 tablet (40 mg total) by mouth daily.   polyethylene glycol 17 g packet Commonly known as: MIRALAX / GLYCOLAX Take 17 g by mouth daily as needed for mild constipation.   Rivaroxaban 15 MG Tabs tablet Commonly known as: XARELTO Take 1 tablet (15 mg total) by mouth daily with supper. What changed: when to take this   travoprost (benzalkonium) 0.004 % ophthalmic solution Commonly known as: TRAVATAN Place 1 drop into both eyes at bedtime.       Follow-up Information    [name], [place]. Call in 1 week(s).   Specialty: Physician Assistant Contact information: [place] [address] [phone]        [name], MD Follow up in 2 week(s).   Specialty: Neurosurgery Contact information: [address] [place] [address] [place] [address] [phone]               No acute changes overnight.   Signed: [name] 03/01/2020, 3:37 PM

## 2020-03-01 NOTE — NC FL2 (Signed)
[place] LEVEL OF CARE SCREENING TOOL     IDENTIFICATION  Patient Name: Crystal Ashley Birthdate: June 27, 1946 Sex: female Admission Date (Current Location): 02/26/2020  [facility] and [place] Number:  Herbalist and Address:  [facility] [place]. [facility], [place] [address], [place], [place] [address]      Provider Number: [id number]  Attending Physician Name and Address:  [name], MD  Relative Name and Phone Number:  [name], son    Current Level of Care:   Recommended Level of Care: [place] Prior Approval Number:    Date Approved/Denied:   PASRR Number:    Discharge Plan: SNF    Current Diagnoses: Patient Active Problem List   Diagnosis Date Noted  . Elective surgery   . Fusion of spine, thoracolumbar region 02/26/2020  . Acute blood loss anemia   . PAF (paroxysmal atrial fibrillation) ([place])   . Controlled type 2 diabetes mellitus with hyperglycemia, without long-term current use of insulin ([place])   . Fracture of vertebra due to osteoporosis ([place])   . Acute midline thoracic back pain   . Multiple traumatic injuries 02/12/2020  . Closed fracture of multiple pubic rami, right, initial encounter ([place]) 02/07/2020  . Atrial fibrillation, chronic ([place]) 02/07/2020  . Chronic combined systolic (congestive) and diastolic (congestive) heart failure ([place])   . Acute respiratory failure with hypoxia ([place]) 10/18/2018  . Declining mobility 09/23/2018  . B12 deficiency 09/23/2018  . Morbid obesity with BMI of 40.0-44.9, adult ([place]) 09/23/2018  . Anemia 09/23/2018  . History of cardioversion 04/26/2018  . Type 2 diabetes mellitus without complication, without long-term current use of insulin ([place]) 03/15/2018  . Atrial fibrillation with RVR ([place]) 03/15/2018  . Morbid obesity ([place]) 01/04/2017  . Hyperlipidemia associated with type 2 diabetes mellitus ([place]) 01/04/2017  . Vitamin D deficiency 12/15/2016  . Glaucoma   .  Hypertension associated with diabetes ([place]) 04/19/2014  . Hypothyroidism 04/19/2014  . History of endometrial cancer 03/30/2014    Orientation RESPIRATION BLADDER Height & Weight     Self, Time, Situation, Place  Normal Continent Weight:   Height:     BEHAVIORAL SYMPTOMS/MOOD NEUROLOGICAL BOWEL NUTRITION STATUS      Continent Diet (See discharge summary)  AMBULATORY STATUS COMMUNICATION OF NEEDS Skin   Total Care Verbally Normal                       Personal Care Assistance Level of Assistance  Bathing, Dressing, Feeding   Feeding assistance: Independent Dressing Assistance: Maximum assistance     Functional Limitations Info  Sight, Hearing, Speech Sight Info: Adequate Hearing Info: Adequate Speech Info: Adequate    SPECIAL CARE FACTORS FREQUENCY  PT (By licensed PT), OT (By licensed OT)     PT Frequency: 5x a week OT Frequency: 5x a week            Contractures Contractures Info: Not present    Additional Factors Info  Allergies   Allergies Info: NKA           Current Medications (03/01/2020):  This is the current hospital active medication list Current Facility-Administered Medications  Medication Dose Route Frequency Provider Last Rate Last Admin  . 0.9 %  sodium chloride infusion  250 mL Intravenous Continuous [name], MD      . acetaminophen (TYLENOL) tablet 650 mg  650 mg Oral Q4H PRN [name], MD   650 mg at 02/29/20  1308   Or  . acetaminophen (TYLENOL) suppository 650 mg  650 mg Rectal Q4H PRN [name], MD      . atorvastatin (LIPITOR) tablet 10 mg  10 mg Oral Daily [name], MD   10 mg at 03/01/20 1033  . cholecalciferol (VITAMIN D3) tablet 5,000 Units  5,000 Units Oral Once per day on Sun Mon Wed Fri [name], MD   5,000 Units at 02/28/20 1039  . [START ON 03/13/2020] cyanocobalamin ((VITAMIN B-12)) injection 1,000 mcg  1,000 mcg Intramuscular Q30 days [name], MD      . diltiazem  (CARDIZEM CD) 24 hr capsule 180 mg  180 mg Oral Daily Aline August, MD   180 mg at 03/01/20 1033  . dorzolamide-timolol (COSOPT) 22.3-6.8 MG/ML ophthalmic solution 1 drop  1 drop Both Eyes BID [name], MD   1 drop at 03/01/20 1035  . enoxaparin (LOVENOX) injection 40 mg  40 mg Subcutaneous Q24H [name], MD   40 mg at 03/01/20 1033  . feeding supplement (ENSURE ENLIVE) (ENSURE ENLIVE) liquid 237 mL  237 mL Oral BID BM [name], MD   237 mL at 03/01/20 1033  . furosemide (LASIX) tablet 40 mg  40 mg Oral Daily [name], MD   40 mg at 03/01/20 1033  . guaiFENesin-dextromethorphan (ROBITUSSIN DM) 100-10 MG/5ML syrup 5 mL  5 mL Oral Q4H PRN [name], MD      . HYDROmorphone (DILAUDID) injection 0.5 mg  0.5 mg Intravenous Q2H PRN [name], MD      . insulin aspart (novoLOG) injection 0-15 Units  0-15 Units Subcutaneous TID WC [name], MD   2 Units at 03/01/20 0900  . insulin aspart (novoLOG) injection 0-5 Units  0-5 Units Subcutaneous QHS [name], MD   2 Units at 02/27/20 2146  . insulin glargine (LANTUS) injection 10 Units  10 Units Subcutaneous QHS Lequita Halt, MD   10 Units at 02/29/20 2116  . levothyroxine (SYNTHROID) tablet 75 mcg  75 mcg Oral QAC breakfast [name], MD   75 mcg at 03/01/20 0606  . menthol-cetylpyridinium (CEPACOL) lozenge 3 mg  1 lozenge Oral PRN [name], MD       Or  . phenol [facility]) mouth spray 1 spray  1 spray Mouth/Throat PRN [name], MD      . metFORMIN (GLUCOPHAGE) tablet 1,000 mg  1,000 mg Oral Q breakfast [name], MD   1,000 mg at 03/01/20 1033  . methocarbamol (ROBAXIN) tablet 500 mg  500 mg Oral Q6H PRN [name], MD   500 mg at 02/29/20 6578   Or  . methocarbamol (ROBAXIN) 500 mg in dextrose 5 % 50 mL IVPB  500 mg Intravenous Q6H PRN [name], MD      . metoprolol succinate (TOPROL-XL) 24 hr tablet 100 mg  100 mg Oral  Daily [name], MD   100 mg at 03/01/20 1033  . ondansetron (ZOFRAN) tablet 4 mg  4 mg Oral Q6H PRN [name], MD       Or  . ondansetron [facility]) injection 4 mg  4 mg Intravenous Q6H PRN [name], MD      . oxyCODONE-acetaminophen (PERCOCET/ROXICET) 5-325 MG per tablet 1-2 tablet  1-2 tablet Oral Q6H PRN [name], MD   2 tablet at 02/29/20 2115  . pantoprazole (PROTONIX) EC tablet  40 mg  40 mg Oral Daily [name], MD   40 mg at 03/01/20 1037  . polyethylene glycol (MIRALAX / GLYCOLAX) packet 17 g  17 g Oral Daily PRN [name], MD      . sodium chloride flush (NS) 0.9 % injection 3 mL  3 mL Intravenous Q12H [name], MD   3 mL at 03/01/20 1034  . sodium chloride flush (NS) 0.9 % injection 3 mL  3 mL Intravenous PRN [name], MD      . sodium phosphate (FLEET) 7-19 GM/118ML enema 1 enema  1 enema Rectal Once PRN [name], MD      . Travoprost (BAK Free) (TRAVATAN) 0.004 % ophthalmic solution SOLN 1 drop  1 drop Both Eyes QHS [name], MD   1 drop at 02/29/20 2120     Discharge Medications: Please see discharge summary for a list of discharge medications.  Relevant Imaging Results:  Relevant Lab Results:   Additional Information SSN [id number]  Arvella Merles, [place]

## 2020-03-02 DIAGNOSIS — E1165 Type 2 diabetes mellitus with hyperglycemia: Secondary | ICD-10-CM | POA: Diagnosis not present

## 2020-03-02 DIAGNOSIS — S22089D Unspecified fracture of T11-T12 vertebra, subsequent encounter for fracture with routine healing: Secondary | ICD-10-CM | POA: Diagnosis not present

## 2020-03-02 DIAGNOSIS — I959 Hypotension, unspecified: Secondary | ICD-10-CM | POA: Diagnosis not present

## 2020-03-02 DIAGNOSIS — E039 Hypothyroidism, unspecified: Secondary | ICD-10-CM | POA: Diagnosis not present

## 2020-03-02 DIAGNOSIS — R4020 Unspecified coma: Secondary | ICD-10-CM | POA: Diagnosis not present

## 2020-03-02 DIAGNOSIS — Z833 Family history of diabetes mellitus: Secondary | ICD-10-CM | POA: Diagnosis not present

## 2020-03-02 DIAGNOSIS — S22000A Wedge compression fracture of unspecified thoracic vertebra, initial encounter for closed fracture: Secondary | ICD-10-CM | POA: Diagnosis not present

## 2020-03-02 DIAGNOSIS — Z79899 Other long term (current) drug therapy: Secondary | ICD-10-CM | POA: Diagnosis not present

## 2020-03-02 DIAGNOSIS — H409 Unspecified glaucoma: Secondary | ICD-10-CM | POA: Diagnosis not present

## 2020-03-02 DIAGNOSIS — K5900 Constipation, unspecified: Secondary | ICD-10-CM | POA: Diagnosis not present

## 2020-03-02 DIAGNOSIS — R000 Tachycardia, unspecified: Secondary | ICD-10-CM | POA: Diagnosis not present

## 2020-03-02 DIAGNOSIS — R1312 Dysphagia, oropharyngeal phase: Secondary | ICD-10-CM | POA: Diagnosis not present

## 2020-03-02 DIAGNOSIS — Z9181 History of falling: Secondary | ICD-10-CM | POA: Diagnosis not present

## 2020-03-02 DIAGNOSIS — S32599D Other specified fracture of unspecified pubis, subsequent encounter for fracture with routine healing: Secondary | ICD-10-CM | POA: Diagnosis not present

## 2020-03-02 DIAGNOSIS — S32599A Other specified fracture of unspecified pubis, initial encounter for closed fracture: Secondary | ICD-10-CM | POA: Diagnosis not present

## 2020-03-02 DIAGNOSIS — R41841 Cognitive communication deficit: Secondary | ICD-10-CM | POA: Diagnosis not present

## 2020-03-02 DIAGNOSIS — I469 Cardiac arrest, cause unspecified: Secondary | ICD-10-CM | POA: Diagnosis not present

## 2020-03-02 DIAGNOSIS — I509 Heart failure, unspecified: Secondary | ICD-10-CM | POA: Diagnosis not present

## 2020-03-02 DIAGNOSIS — Z9114 Patient's other noncompliance with medication regimen: Secondary | ICD-10-CM | POA: Diagnosis not present

## 2020-03-02 DIAGNOSIS — E119 Type 2 diabetes mellitus without complications: Secondary | ICD-10-CM | POA: Diagnosis not present

## 2020-03-02 DIAGNOSIS — M62838 Other muscle spasm: Secondary | ICD-10-CM | POA: Diagnosis not present

## 2020-03-02 DIAGNOSIS — M4325 Fusion of spine, thoracolumbar region: Secondary | ICD-10-CM | POA: Diagnosis not present

## 2020-03-02 DIAGNOSIS — I5189 Other ill-defined heart diseases: Secondary | ICD-10-CM | POA: Diagnosis not present

## 2020-03-02 DIAGNOSIS — E1136 Type 2 diabetes mellitus with diabetic cataract: Secondary | ICD-10-CM | POA: Diagnosis not present

## 2020-03-02 DIAGNOSIS — J90 Pleural effusion, not elsewhere classified: Secondary | ICD-10-CM | POA: Diagnosis not present

## 2020-03-02 DIAGNOSIS — R0989 Other specified symptoms and signs involving the circulatory and respiratory systems: Secondary | ICD-10-CM | POA: Diagnosis not present

## 2020-03-02 DIAGNOSIS — D638 Anemia in other chronic diseases classified elsewhere: Secondary | ICD-10-CM | POA: Diagnosis not present

## 2020-03-02 DIAGNOSIS — R0689 Other abnormalities of breathing: Secondary | ICD-10-CM | POA: Diagnosis not present

## 2020-03-02 DIAGNOSIS — Z981 Arthrodesis status: Secondary | ICD-10-CM | POA: Diagnosis not present

## 2020-03-02 DIAGNOSIS — R1110 Vomiting, unspecified: Secondary | ICD-10-CM | POA: Diagnosis not present

## 2020-03-02 DIAGNOSIS — Z20822 Contact with and (suspected) exposure to covid-19: Secondary | ICD-10-CM | POA: Diagnosis not present

## 2020-03-02 DIAGNOSIS — R198 Other specified symptoms and signs involving the digestive system and abdomen: Secondary | ICD-10-CM | POA: Diagnosis not present

## 2020-03-02 DIAGNOSIS — S32519D Fracture of superior rim of unspecified pubis, subsequent encounter for fracture with routine healing: Secondary | ICD-10-CM | POA: Diagnosis not present

## 2020-03-02 DIAGNOSIS — R634 Abnormal weight loss: Secondary | ICD-10-CM | POA: Diagnosis not present

## 2020-03-02 DIAGNOSIS — E118 Type 2 diabetes mellitus with unspecified complications: Secondary | ICD-10-CM | POA: Diagnosis not present

## 2020-03-02 DIAGNOSIS — S22000D Wedge compression fracture of unspecified thoracic vertebra, subsequent encounter for fracture with routine healing: Secondary | ICD-10-CM | POA: Diagnosis not present

## 2020-03-02 DIAGNOSIS — E876 Hypokalemia: Secondary | ICD-10-CM | POA: Diagnosis not present

## 2020-03-02 DIAGNOSIS — I2510 Atherosclerotic heart disease of native coronary artery without angina pectoris: Secondary | ICD-10-CM | POA: Diagnosis not present

## 2020-03-02 DIAGNOSIS — R262 Difficulty in walking, not elsewhere classified: Secondary | ICD-10-CM | POA: Diagnosis not present

## 2020-03-02 DIAGNOSIS — R0602 Shortness of breath: Secondary | ICD-10-CM | POA: Diagnosis not present

## 2020-03-02 DIAGNOSIS — I700 Atherosclerosis of aorta: Secondary | ICD-10-CM | POA: Diagnosis not present

## 2020-03-02 DIAGNOSIS — S22078D Other fracture of T9-T10 vertebra, subsequent encounter for fracture with routine healing: Secondary | ICD-10-CM | POA: Diagnosis not present

## 2020-03-02 DIAGNOSIS — R0902 Hypoxemia: Secondary | ICD-10-CM | POA: Diagnosis not present

## 2020-03-02 DIAGNOSIS — E038 Other specified hypothyroidism: Secondary | ICD-10-CM | POA: Diagnosis not present

## 2020-03-02 DIAGNOSIS — I951 Orthostatic hypotension: Secondary | ICD-10-CM | POA: Diagnosis not present

## 2020-03-02 DIAGNOSIS — Z79890 Hormone replacement therapy (postmenopausal): Secondary | ICD-10-CM | POA: Diagnosis not present

## 2020-03-02 DIAGNOSIS — I480 Paroxysmal atrial fibrillation: Secondary | ICD-10-CM | POA: Diagnosis not present

## 2020-03-02 DIAGNOSIS — D649 Anemia, unspecified: Secondary | ICD-10-CM | POA: Diagnosis not present

## 2020-03-02 DIAGNOSIS — J189 Pneumonia, unspecified organism: Secondary | ICD-10-CM | POA: Diagnosis not present

## 2020-03-02 DIAGNOSIS — G3184 Mild cognitive impairment, so stated: Secondary | ICD-10-CM | POA: Diagnosis not present

## 2020-03-02 DIAGNOSIS — Z7901 Long term (current) use of anticoagulants: Secondary | ICD-10-CM | POA: Diagnosis not present

## 2020-03-02 DIAGNOSIS — H269 Unspecified cataract: Secondary | ICD-10-CM | POA: Diagnosis not present

## 2020-03-02 DIAGNOSIS — Z794 Long term (current) use of insulin: Secondary | ICD-10-CM | POA: Diagnosis not present

## 2020-03-02 DIAGNOSIS — Z66 Do not resuscitate: Secondary | ICD-10-CM | POA: Diagnosis not present

## 2020-03-02 DIAGNOSIS — I10 Essential (primary) hypertension: Secondary | ICD-10-CM | POA: Diagnosis not present

## 2020-03-02 DIAGNOSIS — M6281 Muscle weakness (generalized): Secondary | ICD-10-CM | POA: Diagnosis not present

## 2020-03-02 DIAGNOSIS — I4891 Unspecified atrial fibrillation: Secondary | ICD-10-CM | POA: Diagnosis not present

## 2020-03-02 DIAGNOSIS — R112 Nausea with vomiting, unspecified: Secondary | ICD-10-CM | POA: Diagnosis not present

## 2020-03-02 DIAGNOSIS — R109 Unspecified abdominal pain: Secondary | ICD-10-CM | POA: Diagnosis not present

## 2020-03-02 DIAGNOSIS — R110 Nausea: Secondary | ICD-10-CM | POA: Diagnosis not present

## 2020-03-02 DIAGNOSIS — Z7401 Bed confinement status: Secondary | ICD-10-CM | POA: Diagnosis not present

## 2020-03-02 DIAGNOSIS — S32519A Fracture of superior rim of unspecified pubis, initial encounter for closed fracture: Secondary | ICD-10-CM | POA: Diagnosis not present

## 2020-03-02 DIAGNOSIS — M8088XA Other osteoporosis with current pathological fracture, vertebra(e), initial encounter for fracture: Secondary | ICD-10-CM | POA: Diagnosis not present

## 2020-03-02 DIAGNOSIS — S22009S Unspecified fracture of unspecified thoracic vertebra, sequela: Secondary | ICD-10-CM | POA: Diagnosis not present

## 2020-03-02 DIAGNOSIS — J9811 Atelectasis: Secondary | ICD-10-CM | POA: Diagnosis not present

## 2020-03-02 DIAGNOSIS — Z4789 Encounter for other orthopedic aftercare: Secondary | ICD-10-CM | POA: Diagnosis not present

## 2020-03-02 DIAGNOSIS — Z0189 Encounter for other specified special examinations: Secondary | ICD-10-CM | POA: Diagnosis not present

## 2020-03-02 DIAGNOSIS — K5909 Other constipation: Secondary | ICD-10-CM | POA: Diagnosis not present

## 2020-03-02 DIAGNOSIS — I119 Hypertensive heart disease without heart failure: Secondary | ICD-10-CM | POA: Diagnosis not present

## 2020-03-02 DIAGNOSIS — S32301D Unspecified fracture of right ilium, subsequent encounter for fracture with routine healing: Secondary | ICD-10-CM | POA: Diagnosis not present

## 2020-03-02 DIAGNOSIS — Z8542 Personal history of malignant neoplasm of other parts of uterus: Secondary | ICD-10-CM | POA: Diagnosis not present

## 2020-03-02 DIAGNOSIS — Z90710 Acquired absence of both cervix and uterus: Secondary | ICD-10-CM | POA: Diagnosis not present

## 2020-03-02 DIAGNOSIS — M810 Age-related osteoporosis without current pathological fracture: Secondary | ICD-10-CM | POA: Diagnosis not present

## 2020-03-02 DIAGNOSIS — U071 COVID-19: Secondary | ICD-10-CM | POA: Diagnosis not present

## 2020-03-02 DIAGNOSIS — M2550 Pain in unspecified joint: Secondary | ICD-10-CM | POA: Diagnosis not present

## 2020-03-02 LAB — GLUCOSE, CAPILLARY
Glucose-Capillary: 128 mg/dL — ABNORMAL HIGH (ref 70–99)
Glucose-Capillary: 224 mg/dL — ABNORMAL HIGH (ref 70–99)

## 2020-03-02 NOTE — Plan of Care (Signed)

## 2020-03-02 NOTE — Progress Notes (Signed)
Patient ID: Crystal Ashley, female   DOB: [birth year], 74 y.o.   MRN: [id number] Patient is for discharge to rehab facility as per primary neurosurgical team.  Currently heart rate has remained stable on Cardizem 180 mg daily and metoprolol 100 mg daily.  Continue same regimen on discharge and follow-up with Dr. Einar Gip as an outpatient.  Hospitalist service will sign off.  Please recall [place] if needed.

## 2020-03-02 NOTE — TOC Transition Note (Signed)
Transition of Care [facility]) - CM/SW Discharge Note   Patient Details  Name: [name] MRN: [id number] Date of Birth: 1946/05/27  [facility]) CM/SW Contact:  Oretha Milch, LCSW Phone Number: 03/02/2020, 12:21 PM   Clinical Narrative: CSW confirmed with [place] admission's staff patient may discharge to the facility. CSW requested a PASRR and notes it is:  [id number]. Patient's room at the facility will be 101P. RN number for report is: [phone].     Final next level of care: Skilled Nursing Facility Barriers to Discharge: Barriers Resolved   Patient Goals and CMS Choice        Discharge Placement                       Discharge Plan and Services In-house Referral: Clinical Social Work                                   Social Determinants of Health (SDOH) Interventions     Readmission Risk Interventions Readmission Risk Prevention Plan 02/08/2020  Transportation Screening Complete  PCP or Specialist Appt within 5-7 Days Complete  Home Care Screening Complete  Medication Review (RN CM) Complete  Some recent data might be hidden

## 2020-03-04 ENCOUNTER — Other Ambulatory Visit: Payer: Self-pay | Admitting: *Deleted

## 2020-03-04 DIAGNOSIS — I509 Heart failure, unspecified: Secondary | ICD-10-CM | POA: Diagnosis not present

## 2020-03-04 DIAGNOSIS — S32599A Other specified fracture of unspecified pubis, initial encounter for closed fracture: Secondary | ICD-10-CM | POA: Diagnosis not present

## 2020-03-04 DIAGNOSIS — S22000A Wedge compression fracture of unspecified thoracic vertebra, initial encounter for closed fracture: Secondary | ICD-10-CM | POA: Diagnosis not present

## 2020-03-04 DIAGNOSIS — I4891 Unspecified atrial fibrillation: Secondary | ICD-10-CM | POA: Diagnosis not present

## 2020-03-04 NOTE — Patient Outreach (Addendum)
Member screened for potential [facility] Care Management needs as a benefit of [place] Medicare.  Mrs. Forness is receiving skilled therapy at [facility] and [place].   Will collaborate with [facility] and [place] about transition plans and for potential [facility] Care Management needs.   Sent communication to [facility] to make aware Probation officer following for potential [facility] Care Management needs.   Will continue to follow while member resides in [facility]. Will plan outreach accordingly.   ADDENDUM: Update from [place]. Member from home with son and daughter in [place]. Family meeting scheduled for tomorrow. Transition plan likely to return home with family and possible hired caregivers.    Marthenia Rolling, MSN-Ed, RN,BSN [facility] Post Acute Care Coordinator [phone] [facility]) [phone]  (Toll free office)    Marthenia Rolling, MSN-Ed, RN,BSN [place] Acute Care Coordinator [phone] [facility]) [phone]  (Toll free office)

## 2020-03-05 DIAGNOSIS — I4891 Unspecified atrial fibrillation: Secondary | ICD-10-CM | POA: Diagnosis not present

## 2020-03-05 DIAGNOSIS — S32599A Other specified fracture of unspecified pubis, initial encounter for closed fracture: Secondary | ICD-10-CM | POA: Diagnosis not present

## 2020-03-05 DIAGNOSIS — S32519A Fracture of superior rim of unspecified pubis, initial encounter for closed fracture: Secondary | ICD-10-CM | POA: Diagnosis not present

## 2020-03-05 DIAGNOSIS — S22000A Wedge compression fracture of unspecified thoracic vertebra, initial encounter for closed fracture: Secondary | ICD-10-CM | POA: Diagnosis not present

## 2020-03-07 DIAGNOSIS — Z0189 Encounter for other specified special examinations: Secondary | ICD-10-CM | POA: Diagnosis not present

## 2020-03-07 DIAGNOSIS — R112 Nausea with vomiting, unspecified: Secondary | ICD-10-CM | POA: Diagnosis not present

## 2020-03-07 DIAGNOSIS — M62838 Other muscle spasm: Secondary | ICD-10-CM | POA: Diagnosis not present

## 2020-03-07 DIAGNOSIS — S32301D Unspecified fracture of right ilium, subsequent encounter for fracture with routine healing: Secondary | ICD-10-CM | POA: Diagnosis not present

## 2020-03-11 ENCOUNTER — Ambulatory Visit: Payer: Medicare Other | Admitting: Cardiology

## 2020-03-12 DIAGNOSIS — S22000D Wedge compression fracture of unspecified thoracic vertebra, subsequent encounter for fracture with routine healing: Secondary | ICD-10-CM | POA: Diagnosis not present

## 2020-03-12 DIAGNOSIS — S32599D Other specified fracture of unspecified pubis, subsequent encounter for fracture with routine healing: Secondary | ICD-10-CM | POA: Diagnosis not present

## 2020-03-12 DIAGNOSIS — I480 Paroxysmal atrial fibrillation: Secondary | ICD-10-CM | POA: Diagnosis not present

## 2020-03-12 DIAGNOSIS — I5189 Other ill-defined heart diseases: Secondary | ICD-10-CM | POA: Diagnosis not present

## 2020-03-14 ENCOUNTER — Other Ambulatory Visit: Payer: Self-pay | Admitting: *Deleted

## 2020-03-14 DIAGNOSIS — I951 Orthostatic hypotension: Secondary | ICD-10-CM | POA: Diagnosis not present

## 2020-03-14 DIAGNOSIS — S22000A Wedge compression fracture of unspecified thoracic vertebra, initial encounter for closed fracture: Secondary | ICD-10-CM | POA: Diagnosis not present

## 2020-03-14 DIAGNOSIS — I480 Paroxysmal atrial fibrillation: Secondary | ICD-10-CM | POA: Diagnosis not present

## 2020-03-14 DIAGNOSIS — S32519A Fracture of superior rim of unspecified pubis, initial encounter for closed fracture: Secondary | ICD-10-CM | POA: Diagnosis not present

## 2020-03-14 DIAGNOSIS — S32599D Other specified fracture of unspecified pubis, subsequent encounter for fracture with routine healing: Secondary | ICD-10-CM | POA: Diagnosis not present

## 2020-03-14 NOTE — Patient Outreach (Signed)
[place] Coordinator follow up. Member screened for potential [facility] Care Management needs as a benefit of [place] Medicare.  Mrs. Ortego remains in [facility]. Facility SW previously reported member's transition plan is to return home with family. Member's son Shakisha Abend is primary contact.   Telephone call made to [facility] [phone]. Patient identifiers confirmed. [name] states member is making small progress but is still not able to walk yet. States pain is improving however. [name] confirms member lives with him. States he is not sure when member is discharging as of yet.   Discussed that writer will contact him closer to SNF dc. Will email [place] Management and writer contact information in the interim. Prashant expressed appreciation of the call.   Will continue to collaborate with facility SW while member resides in SNF. Will plan outreach again to family closer to SNF dc.    Marthenia Rolling, MSN-Ed, RN,BSN [place] Acute Care Coordinator [phone] [facility]) [phone]  (Toll free office)

## 2020-03-23 ENCOUNTER — Telehealth: Payer: Self-pay | Admitting: Cardiology

## 2020-03-23 ENCOUNTER — Other Ambulatory Visit: Payer: Self-pay | Admitting: Cardiology

## 2020-03-23 DIAGNOSIS — I4821 Permanent atrial fibrillation: Secondary | ICD-10-CM

## 2020-03-23 MED ORDER — DILTIAZEM HCL ER COATED BEADS 180 MG PO CP24: 180.0000 mg | ORAL_CAPSULE | Freq: Every day | ORAL | 1 refills | Status: DC

## 2020-03-23 NOTE — Telephone Encounter (Signed)
I sent in Rx for Dilt SR 180 mg to [facility] on [address]. I have called and cancelled the Rx.   Adrian Prows, MD, [facility] 03/23/2020, 1:34 PM Office: [phone]

## 2020-03-26 ENCOUNTER — Other Ambulatory Visit: Payer: Self-pay | Admitting: *Deleted

## 2020-03-26 NOTE — Patient Outreach (Addendum)
[place] Coordinator follow up. Member screened for potential [facility] Care Management needs as a benefit of [place] Medicare.  Update received from [facility] and [place] indicating transition plan remains to reurn home with family.  Will continue to follow while member resides in SNF. Will plan to outreach member's son again as appropriate to discuss [place] Management services.   Marthenia Rolling, MSN-Ed, RN,BSN [place] Acute Care Coordinator [phone] [facility]) [phone]  (Toll free office)

## 2020-03-28 DIAGNOSIS — I119 Hypertensive heart disease without heart failure: Secondary | ICD-10-CM | POA: Diagnosis not present

## 2020-03-28 DIAGNOSIS — I480 Paroxysmal atrial fibrillation: Secondary | ICD-10-CM | POA: Diagnosis not present

## 2020-03-28 DIAGNOSIS — S22000D Wedge compression fracture of unspecified thoracic vertebra, subsequent encounter for fracture with routine healing: Secondary | ICD-10-CM | POA: Diagnosis not present

## 2020-03-28 DIAGNOSIS — S32599D Other specified fracture of unspecified pubis, subsequent encounter for fracture with routine healing: Secondary | ICD-10-CM | POA: Diagnosis not present

## 2020-03-28 DIAGNOSIS — M810 Age-related osteoporosis without current pathological fracture: Secondary | ICD-10-CM | POA: Diagnosis not present

## 2020-03-28 DIAGNOSIS — E038 Other specified hypothyroidism: Secondary | ICD-10-CM | POA: Diagnosis not present

## 2020-03-28 DIAGNOSIS — I5189 Other ill-defined heart diseases: Secondary | ICD-10-CM | POA: Diagnosis not present

## 2020-03-28 DIAGNOSIS — D649 Anemia, unspecified: Secondary | ICD-10-CM | POA: Diagnosis not present

## 2020-03-28 DIAGNOSIS — E118 Type 2 diabetes mellitus with unspecified complications: Secondary | ICD-10-CM | POA: Diagnosis not present

## 2020-04-02 DIAGNOSIS — S32599D Other specified fracture of unspecified pubis, subsequent encounter for fracture with routine healing: Secondary | ICD-10-CM | POA: Diagnosis not present

## 2020-04-02 DIAGNOSIS — S32519D Fracture of superior rim of unspecified pubis, subsequent encounter for fracture with routine healing: Secondary | ICD-10-CM | POA: Diagnosis not present

## 2020-04-02 DIAGNOSIS — I480 Paroxysmal atrial fibrillation: Secondary | ICD-10-CM | POA: Diagnosis not present

## 2020-04-02 DIAGNOSIS — S22000D Wedge compression fracture of unspecified thoracic vertebra, subsequent encounter for fracture with routine healing: Secondary | ICD-10-CM | POA: Diagnosis not present

## 2020-04-04 DIAGNOSIS — R198 Other specified symptoms and signs involving the digestive system and abdomen: Secondary | ICD-10-CM | POA: Diagnosis not present

## 2020-04-04 DIAGNOSIS — I480 Paroxysmal atrial fibrillation: Secondary | ICD-10-CM | POA: Diagnosis not present

## 2020-04-04 DIAGNOSIS — K5909 Other constipation: Secondary | ICD-10-CM | POA: Diagnosis not present

## 2020-04-04 DIAGNOSIS — I5189 Other ill-defined heart diseases: Secondary | ICD-10-CM | POA: Diagnosis not present

## 2020-04-08 ENCOUNTER — Other Ambulatory Visit: Payer: Self-pay | Admitting: *Deleted

## 2020-04-08 NOTE — Patient Outreach (Signed)
[facility] Post- Acute Care Coordinator follow up.   Update received from [facility]. Member's transition plan is to return home with family. Has supportive family. Son visits member frequently.   Will continue to follow for potential [facility] Care Management needs while member resides in SNF.  Marthenia Rolling, MSN-Ed, RN,BSN [place] Acute Care Coordinator [phone] [facility]) [phone]  (Toll free office)

## 2020-04-08 NOTE — Patient Outreach (Signed)
Member screened for potential [facility] Care Management needs as a benefit of [place] Medicare.  Per Patient Crystal Ashley member resides in [place] [facility].   Communication sent to [place] to request update on transtion plans.   Will continue to follow while member resides in SNF.   Marthenia Rolling, MSN-Ed, RN,BSN [place] Acute Care Coordinator [phone] [facility]) [phone]  (Toll free office)

## 2020-04-15 ENCOUNTER — Encounter: Payer: Self-pay | Admitting: Physician Assistant

## 2020-04-15 DIAGNOSIS — S22078D Other fracture of T9-T10 vertebra, subsequent encounter for fracture with routine healing: Secondary | ICD-10-CM | POA: Diagnosis not present

## 2020-04-16 ENCOUNTER — Ambulatory Visit: Payer: Medicare Other | Admitting: Cardiology

## 2020-04-17 ENCOUNTER — Other Ambulatory Visit: Payer: Self-pay | Admitting: *Deleted

## 2020-04-17 DIAGNOSIS — S22000A Wedge compression fracture of unspecified thoracic vertebra, initial encounter for closed fracture: Secondary | ICD-10-CM | POA: Diagnosis not present

## 2020-04-17 DIAGNOSIS — R198 Other specified symptoms and signs involving the digestive system and abdomen: Secondary | ICD-10-CM | POA: Diagnosis not present

## 2020-04-17 DIAGNOSIS — S32599D Other specified fracture of unspecified pubis, subsequent encounter for fracture with routine healing: Secondary | ICD-10-CM | POA: Diagnosis not present

## 2020-04-17 DIAGNOSIS — K5909 Other constipation: Secondary | ICD-10-CM | POA: Diagnosis not present

## 2020-04-17 DIAGNOSIS — S32519D Fracture of superior rim of unspecified pubis, subsequent encounter for fracture with routine healing: Secondary | ICD-10-CM | POA: Diagnosis not present

## 2020-04-17 DIAGNOSIS — E118 Type 2 diabetes mellitus with unspecified complications: Secondary | ICD-10-CM | POA: Diagnosis not present

## 2020-04-17 NOTE — Patient Outreach (Signed)
[facility]  Post- Acute Care Coordinator follow up. Member screened for potential [facility] Care Management needs as a benefit of [place] Medicare.  Communication sent to [place] to request an update.  Will continue to follow while member resides in SNF.  Marthenia Rolling, MSN-Ed, RN,BSN [place] Acute Care Coordinator [phone] [facility]) [phone]  (Toll free office)

## 2020-04-18 ENCOUNTER — Other Ambulatory Visit: Payer: Self-pay | Admitting: Physician Assistant

## 2020-04-18 DIAGNOSIS — R1110 Vomiting, unspecified: Secondary | ICD-10-CM | POA: Diagnosis not present

## 2020-04-18 DIAGNOSIS — E2839 Other primary ovarian failure: Secondary | ICD-10-CM

## 2020-04-18 DIAGNOSIS — R634 Abnormal weight loss: Secondary | ICD-10-CM | POA: Diagnosis not present

## 2020-04-18 DIAGNOSIS — Z9114 Patient's other noncompliance with medication regimen: Secondary | ICD-10-CM | POA: Diagnosis not present

## 2020-04-18 DIAGNOSIS — M8008XS Age-related osteoporosis with current pathological fracture, vertebra(e), sequela: Secondary | ICD-10-CM

## 2020-04-18 DIAGNOSIS — Z79899 Other long term (current) drug therapy: Secondary | ICD-10-CM

## 2020-04-18 DIAGNOSIS — E118 Type 2 diabetes mellitus with unspecified complications: Secondary | ICD-10-CM | POA: Diagnosis not present

## 2020-04-24 DIAGNOSIS — E119 Type 2 diabetes mellitus without complications: Secondary | ICD-10-CM | POA: Diagnosis not present

## 2020-04-24 DIAGNOSIS — I509 Heart failure, unspecified: Secondary | ICD-10-CM | POA: Diagnosis not present

## 2020-04-24 DIAGNOSIS — R262 Difficulty in walking, not elsewhere classified: Secondary | ICD-10-CM | POA: Diagnosis not present

## 2020-04-24 DIAGNOSIS — I10 Essential (primary) hypertension: Secondary | ICD-10-CM | POA: Diagnosis not present

## 2020-04-24 DIAGNOSIS — M8088XA Other osteoporosis with current pathological fracture, vertebra(e), initial encounter for fracture: Secondary | ICD-10-CM | POA: Diagnosis not present

## 2020-04-24 DIAGNOSIS — K5900 Constipation, unspecified: Secondary | ICD-10-CM | POA: Diagnosis not present

## 2020-04-24 DIAGNOSIS — I4891 Unspecified atrial fibrillation: Secondary | ICD-10-CM | POA: Diagnosis not present

## 2020-04-24 DIAGNOSIS — R110 Nausea: Secondary | ICD-10-CM | POA: Diagnosis not present

## 2020-04-24 DIAGNOSIS — E876 Hypokalemia: Secondary | ICD-10-CM | POA: Diagnosis not present

## 2020-04-24 DIAGNOSIS — M6281 Muscle weakness (generalized): Secondary | ICD-10-CM | POA: Diagnosis not present

## 2020-04-24 DIAGNOSIS — E039 Hypothyroidism, unspecified: Secondary | ICD-10-CM | POA: Diagnosis not present

## 2020-04-25 DIAGNOSIS — S32519D Fracture of superior rim of unspecified pubis, subsequent encounter for fracture with routine healing: Secondary | ICD-10-CM | POA: Diagnosis not present

## 2020-04-25 DIAGNOSIS — S32599D Other specified fracture of unspecified pubis, subsequent encounter for fracture with routine healing: Secondary | ICD-10-CM | POA: Diagnosis not present

## 2020-04-25 DIAGNOSIS — S22009S Unspecified fracture of unspecified thoracic vertebra, sequela: Secondary | ICD-10-CM | POA: Diagnosis not present

## 2020-04-25 DIAGNOSIS — R262 Difficulty in walking, not elsewhere classified: Secondary | ICD-10-CM | POA: Diagnosis not present

## 2020-04-26 DIAGNOSIS — M6281 Muscle weakness (generalized): Secondary | ICD-10-CM | POA: Diagnosis not present

## 2020-04-26 DIAGNOSIS — E119 Type 2 diabetes mellitus without complications: Secondary | ICD-10-CM | POA: Diagnosis not present

## 2020-04-26 DIAGNOSIS — I509 Heart failure, unspecified: Secondary | ICD-10-CM | POA: Diagnosis not present

## 2020-04-26 DIAGNOSIS — I4891 Unspecified atrial fibrillation: Secondary | ICD-10-CM | POA: Diagnosis not present

## 2020-04-26 DIAGNOSIS — E039 Hypothyroidism, unspecified: Secondary | ICD-10-CM | POA: Diagnosis not present

## 2020-04-26 DIAGNOSIS — I10 Essential (primary) hypertension: Secondary | ICD-10-CM | POA: Diagnosis not present

## 2020-04-26 DIAGNOSIS — R262 Difficulty in walking, not elsewhere classified: Secondary | ICD-10-CM | POA: Diagnosis not present

## 2020-04-26 DIAGNOSIS — M8088XA Other osteoporosis with current pathological fracture, vertebra(e), initial encounter for fracture: Secondary | ICD-10-CM | POA: Diagnosis not present

## 2020-05-01 DIAGNOSIS — S22000D Wedge compression fracture of unspecified thoracic vertebra, subsequent encounter for fracture with routine healing: Secondary | ICD-10-CM | POA: Diagnosis not present

## 2020-05-01 DIAGNOSIS — E119 Type 2 diabetes mellitus without complications: Secondary | ICD-10-CM | POA: Diagnosis not present

## 2020-05-01 DIAGNOSIS — E039 Hypothyroidism, unspecified: Secondary | ICD-10-CM | POA: Diagnosis not present

## 2020-05-01 DIAGNOSIS — M8088XA Other osteoporosis with current pathological fracture, vertebra(e), initial encounter for fracture: Secondary | ICD-10-CM | POA: Diagnosis not present

## 2020-05-01 DIAGNOSIS — I10 Essential (primary) hypertension: Secondary | ICD-10-CM | POA: Diagnosis not present

## 2020-05-01 DIAGNOSIS — E876 Hypokalemia: Secondary | ICD-10-CM | POA: Diagnosis not present

## 2020-05-01 DIAGNOSIS — I509 Heart failure, unspecified: Secondary | ICD-10-CM | POA: Diagnosis not present

## 2020-05-01 DIAGNOSIS — M6281 Muscle weakness (generalized): Secondary | ICD-10-CM | POA: Diagnosis not present

## 2020-05-01 DIAGNOSIS — I4891 Unspecified atrial fibrillation: Secondary | ICD-10-CM | POA: Diagnosis not present

## 2020-05-01 DIAGNOSIS — R262 Difficulty in walking, not elsewhere classified: Secondary | ICD-10-CM | POA: Diagnosis not present

## 2020-05-02 DIAGNOSIS — E876 Hypokalemia: Secondary | ICD-10-CM | POA: Diagnosis not present

## 2020-05-08 ENCOUNTER — Other Ambulatory Visit: Payer: Self-pay | Admitting: *Deleted

## 2020-05-08 DIAGNOSIS — M8088XA Other osteoporosis with current pathological fracture, vertebra(e), initial encounter for fracture: Secondary | ICD-10-CM | POA: Diagnosis not present

## 2020-05-08 DIAGNOSIS — E119 Type 2 diabetes mellitus without complications: Secondary | ICD-10-CM | POA: Diagnosis not present

## 2020-05-08 DIAGNOSIS — E039 Hypothyroidism, unspecified: Secondary | ICD-10-CM | POA: Diagnosis not present

## 2020-05-08 DIAGNOSIS — I4891 Unspecified atrial fibrillation: Secondary | ICD-10-CM | POA: Diagnosis not present

## 2020-05-08 DIAGNOSIS — I10 Essential (primary) hypertension: Secondary | ICD-10-CM | POA: Diagnosis not present

## 2020-05-08 DIAGNOSIS — M6281 Muscle weakness (generalized): Secondary | ICD-10-CM | POA: Diagnosis not present

## 2020-05-08 DIAGNOSIS — R262 Difficulty in walking, not elsewhere classified: Secondary | ICD-10-CM | POA: Diagnosis not present

## 2020-05-08 DIAGNOSIS — I509 Heart failure, unspecified: Secondary | ICD-10-CM | POA: Diagnosis not present

## 2020-05-08 NOTE — Patient Outreach (Signed)
Member screened for potential [facility] Care Management needs as a benefit of [place] Medicare.  Per Patient Pearletha Forge member resides in [place] [facility].   SNF SW indicated member's transition plan is to return home with family.  Will plan outreach to member's son to discuss transition plans.  Marthenia Rolling, MSN-Ed, RN,BSN [place] Acute Care Coordinator [phone] [facility]) [phone]  (Toll free office)

## 2020-05-15 DIAGNOSIS — M6281 Muscle weakness (generalized): Secondary | ICD-10-CM | POA: Diagnosis not present

## 2020-05-15 DIAGNOSIS — R262 Difficulty in walking, not elsewhere classified: Secondary | ICD-10-CM | POA: Diagnosis not present

## 2020-05-15 DIAGNOSIS — E039 Hypothyroidism, unspecified: Secondary | ICD-10-CM | POA: Diagnosis not present

## 2020-05-15 DIAGNOSIS — I4891 Unspecified atrial fibrillation: Secondary | ICD-10-CM | POA: Diagnosis not present

## 2020-05-15 DIAGNOSIS — M8088XA Other osteoporosis with current pathological fracture, vertebra(e), initial encounter for fracture: Secondary | ICD-10-CM | POA: Diagnosis not present

## 2020-05-15 DIAGNOSIS — E119 Type 2 diabetes mellitus without complications: Secondary | ICD-10-CM | POA: Diagnosis not present

## 2020-05-15 DIAGNOSIS — I509 Heart failure, unspecified: Secondary | ICD-10-CM | POA: Diagnosis not present

## 2020-05-15 DIAGNOSIS — I10 Essential (primary) hypertension: Secondary | ICD-10-CM | POA: Diagnosis not present

## 2020-05-21 DIAGNOSIS — I480 Paroxysmal atrial fibrillation: Secondary | ICD-10-CM | POA: Diagnosis not present

## 2020-05-21 DIAGNOSIS — E118 Type 2 diabetes mellitus with unspecified complications: Secondary | ICD-10-CM | POA: Diagnosis not present

## 2020-05-21 DIAGNOSIS — R262 Difficulty in walking, not elsewhere classified: Secondary | ICD-10-CM | POA: Diagnosis not present

## 2020-05-21 DIAGNOSIS — I5189 Other ill-defined heart diseases: Secondary | ICD-10-CM | POA: Diagnosis not present

## 2020-05-21 DIAGNOSIS — E038 Other specified hypothyroidism: Secondary | ICD-10-CM | POA: Diagnosis not present

## 2020-05-21 DIAGNOSIS — E876 Hypokalemia: Secondary | ICD-10-CM | POA: Diagnosis not present

## 2020-05-21 DIAGNOSIS — R0989 Other specified symptoms and signs involving the circulatory and respiratory systems: Secondary | ICD-10-CM | POA: Diagnosis not present

## 2020-05-22 ENCOUNTER — Emergency Department (HOSPITAL_COMMUNITY): Payer: Medicare Other

## 2020-05-22 ENCOUNTER — Inpatient Hospital Stay (HOSPITAL_COMMUNITY)
Admission: EM | Admit: 2020-05-22 | Discharge: 2020-05-25 | DRG: 195 | Disposition: A | Discharge status: Skilled Nursing Facility | Payer: Medicare Other | Source: Skilled Nursing Facility | Attending: Family Medicine | Admitting: Family Medicine

## 2020-05-22 DIAGNOSIS — H269 Unspecified cataract: Secondary | ICD-10-CM | POA: Diagnosis present

## 2020-05-22 DIAGNOSIS — Z90710 Acquired absence of both cervix and uterus: Secondary | ICD-10-CM | POA: Diagnosis not present

## 2020-05-22 DIAGNOSIS — I10 Essential (primary) hypertension: Secondary | ICD-10-CM | POA: Diagnosis not present

## 2020-05-22 DIAGNOSIS — E876 Hypokalemia: Secondary | ICD-10-CM | POA: Diagnosis present

## 2020-05-22 DIAGNOSIS — Z6837 Body mass index (BMI) 37.0-37.9, adult: Secondary | ICD-10-CM

## 2020-05-22 DIAGNOSIS — E1136 Type 2 diabetes mellitus with diabetic cataract: Secondary | ICD-10-CM | POA: Diagnosis present

## 2020-05-22 DIAGNOSIS — R0602 Shortness of breath: Secondary | ICD-10-CM | POA: Diagnosis not present

## 2020-05-22 DIAGNOSIS — E119 Type 2 diabetes mellitus without complications: Secondary | ICD-10-CM | POA: Diagnosis present

## 2020-05-22 DIAGNOSIS — Z9181 History of falling: Secondary | ICD-10-CM | POA: Diagnosis not present

## 2020-05-22 DIAGNOSIS — R262 Difficulty in walking, not elsewhere classified: Secondary | ICD-10-CM | POA: Diagnosis not present

## 2020-05-22 DIAGNOSIS — R000 Tachycardia, unspecified: Secondary | ICD-10-CM | POA: Diagnosis present

## 2020-05-22 DIAGNOSIS — G3184 Mild cognitive impairment, so stated: Secondary | ICD-10-CM | POA: Diagnosis not present

## 2020-05-22 DIAGNOSIS — I517 Cardiomegaly: Secondary | ICD-10-CM | POA: Diagnosis not present

## 2020-05-22 DIAGNOSIS — Z79890 Hormone replacement therapy (postmenopausal): Secondary | ICD-10-CM | POA: Diagnosis not present

## 2020-05-22 DIAGNOSIS — R0902 Hypoxemia: Secondary | ICD-10-CM

## 2020-05-22 DIAGNOSIS — J189 Pneumonia, unspecified organism: Principal | ICD-10-CM

## 2020-05-22 DIAGNOSIS — Z833 Family history of diabetes mellitus: Secondary | ICD-10-CM

## 2020-05-22 DIAGNOSIS — Z8542 Personal history of malignant neoplasm of other parts of uterus: Secondary | ICD-10-CM | POA: Diagnosis not present

## 2020-05-22 DIAGNOSIS — M4325 Fusion of spine, thoracolumbar region: Secondary | ICD-10-CM | POA: Diagnosis not present

## 2020-05-22 DIAGNOSIS — R1312 Dysphagia, oropharyngeal phase: Secondary | ICD-10-CM | POA: Diagnosis not present

## 2020-05-22 DIAGNOSIS — Z794 Long term (current) use of insulin: Secondary | ICD-10-CM

## 2020-05-22 DIAGNOSIS — M2550 Pain in unspecified joint: Secondary | ICD-10-CM | POA: Diagnosis not present

## 2020-05-22 DIAGNOSIS — Z7901 Long term (current) use of anticoagulants: Secondary | ICD-10-CM

## 2020-05-22 DIAGNOSIS — Z981 Arthrodesis status: Secondary | ICD-10-CM

## 2020-05-22 DIAGNOSIS — S22089D Unspecified fracture of T11-T12 vertebra, subsequent encounter for fracture with routine healing: Secondary | ICD-10-CM

## 2020-05-22 DIAGNOSIS — M25551 Pain in right hip: Secondary | ICD-10-CM | POA: Diagnosis present

## 2020-05-22 DIAGNOSIS — R404 Transient alteration of awareness: Secondary | ICD-10-CM | POA: Diagnosis not present

## 2020-05-22 DIAGNOSIS — Z20822 Contact with and (suspected) exposure to covid-19: Secondary | ICD-10-CM | POA: Diagnosis present

## 2020-05-22 DIAGNOSIS — H409 Unspecified glaucoma: Secondary | ICD-10-CM | POA: Diagnosis present

## 2020-05-22 DIAGNOSIS — D638 Anemia in other chronic diseases classified elsewhere: Secondary | ICD-10-CM | POA: Diagnosis present

## 2020-05-22 DIAGNOSIS — R41841 Cognitive communication deficit: Secondary | ICD-10-CM | POA: Diagnosis not present

## 2020-05-22 DIAGNOSIS — Z79899 Other long term (current) drug therapy: Secondary | ICD-10-CM

## 2020-05-22 DIAGNOSIS — Z66 Do not resuscitate: Secondary | ICD-10-CM | POA: Diagnosis present

## 2020-05-22 DIAGNOSIS — I2510 Atherosclerotic heart disease of native coronary artery without angina pectoris: Secondary | ICD-10-CM | POA: Diagnosis not present

## 2020-05-22 DIAGNOSIS — U071 COVID-19: Secondary | ICD-10-CM | POA: Diagnosis not present

## 2020-05-22 DIAGNOSIS — R531 Weakness: Secondary | ICD-10-CM | POA: Diagnosis not present

## 2020-05-22 DIAGNOSIS — R5381 Other malaise: Secondary | ICD-10-CM | POA: Diagnosis not present

## 2020-05-22 DIAGNOSIS — E039 Hypothyroidism, unspecified: Secondary | ICD-10-CM | POA: Diagnosis present

## 2020-05-22 DIAGNOSIS — I4891 Unspecified atrial fibrillation: Secondary | ICD-10-CM | POA: Diagnosis not present

## 2020-05-22 DIAGNOSIS — J9811 Atelectasis: Secondary | ICD-10-CM | POA: Diagnosis not present

## 2020-05-22 DIAGNOSIS — Z4789 Encounter for other orthopedic aftercare: Secondary | ICD-10-CM | POA: Diagnosis not present

## 2020-05-22 DIAGNOSIS — R4020 Unspecified coma: Secondary | ICD-10-CM | POA: Diagnosis not present

## 2020-05-22 DIAGNOSIS — M6281 Muscle weakness (generalized): Secondary | ICD-10-CM | POA: Diagnosis not present

## 2020-05-22 DIAGNOSIS — I480 Paroxysmal atrial fibrillation: Secondary | ICD-10-CM | POA: Diagnosis present

## 2020-05-22 DIAGNOSIS — E669 Obesity, unspecified: Secondary | ICD-10-CM | POA: Diagnosis present

## 2020-05-22 DIAGNOSIS — J90 Pleural effusion, not elsewhere classified: Secondary | ICD-10-CM | POA: Diagnosis not present

## 2020-05-22 DIAGNOSIS — Z7401 Bed confinement status: Secondary | ICD-10-CM | POA: Diagnosis not present

## 2020-05-22 DIAGNOSIS — E1165 Type 2 diabetes mellitus with hyperglycemia: Secondary | ICD-10-CM | POA: Diagnosis not present

## 2020-05-22 DIAGNOSIS — I700 Atherosclerosis of aorta: Secondary | ICD-10-CM | POA: Diagnosis not present

## 2020-05-22 LAB — RESPIRATORY PANEL BY RT PCR (FLU A&B, COVID)
Influenza A by PCR: NEGATIVE
Influenza B by PCR: NEGATIVE
SARS Coronavirus 2 by RT PCR: NEGATIVE

## 2020-05-22 LAB — CBC WITH DIFFERENTIAL/PLATELET
Abs Immature Granulocytes: 0.1 10*3/uL — ABNORMAL HIGH (ref 0.00–0.07)
Basophils Absolute: 0 10*3/uL (ref 0.0–0.1)
Basophils Relative: 0 %
Eosinophils Absolute: 0.1 10*3/uL (ref 0.0–0.5)
Eosinophils Relative: 1 %
HCT: 33.1 % — ABNORMAL LOW (ref 36.0–46.0)
Hemoglobin: 9.9 g/dL — ABNORMAL LOW (ref 12.0–15.0)
Immature Granulocytes: 1 %
Lymphocytes Relative: 11 %
Lymphs Abs: 1.4 10*3/uL (ref 0.7–4.0)
MCH: 27.4 pg (ref 26.0–34.0)
MCHC: 29.9 g/dL — ABNORMAL LOW (ref 30.0–36.0)
MCV: 91.7 fL (ref 80.0–100.0)
Monocytes Absolute: 0.4 10*3/uL (ref 0.1–1.0)
Monocytes Relative: 3 %
Neutro Abs: 10.4 10*3/uL — ABNORMAL HIGH (ref 1.7–7.7)
Neutrophils Relative %: 84 %
Platelets: 382 10*3/uL (ref 150–400)
RBC: 3.61 MIL/uL — ABNORMAL LOW (ref 3.87–5.11)
RDW: 19.1 % — ABNORMAL HIGH (ref 11.5–15.5)
WBC: 12.5 10*3/uL — ABNORMAL HIGH (ref 4.0–10.5)
nRBC: 0 % (ref 0.0–0.2)

## 2020-05-22 LAB — COMPREHENSIVE METABOLIC PANEL
ALT: 11 U/L (ref 0–44)
AST: 17 U/L (ref 15–41)
Albumin: 2.5 g/dL — ABNORMAL LOW (ref 3.5–5.0)
Alkaline Phosphatase: 88 U/L (ref 38–126)
Anion gap: 11 (ref 5–15)
BUN: 10 mg/dL (ref 8–23)
CO2: 34 mmol/L — ABNORMAL HIGH (ref 22–32)
Calcium: 8.6 mg/dL — ABNORMAL LOW (ref 8.9–10.3)
Chloride: 95 mmol/L — ABNORMAL LOW (ref 98–111)
Creatinine, Ser: 0.97 mg/dL (ref 0.44–1.00)
GFR calc non Af Amer: 58 mL/min — ABNORMAL LOW (ref >60)
Glucose, Bld: 199 mg/dL — ABNORMAL HIGH (ref 70–99)
Potassium: 3 mmol/L — ABNORMAL LOW (ref 3.5–5.1)
Sodium: 140 mmol/L (ref 135–145)
Total Bilirubin: 0.6 mg/dL (ref 0.3–1.2)
Total Protein: 6.4 g/dL — ABNORMAL LOW (ref 6.5–8.1)

## 2020-05-22 LAB — TROPONIN I (HIGH SENSITIVITY): Troponin I (High Sensitivity): 11 ng/L (ref <18)

## 2020-05-22 LAB — GLUCOSE, CAPILLARY: Glucose-Capillary: 215 mg/dL — ABNORMAL HIGH (ref 70–99)

## 2020-05-22 LAB — MAGNESIUM: Magnesium: 2.1 mg/dL (ref 1.7–2.4)

## 2020-05-22 LAB — BRAIN NATRIURETIC PEPTIDE: B Natriuretic Peptide: 270.8 pg/mL — ABNORMAL HIGH (ref 0.0–100.0)

## 2020-05-22 IMAGING — CT CT ANGIO CHEST
2 of 6 series · 18 of 46 positions shown · IV contrast (omnipaque)
Comparison: Radiograph earlier this day.

CLINICAL DATA: Chest pain or SOB, pleurisy or effusion suspected

EXAM:
CT ANGIOGRAPHY CHEST WITH CONTRAST
TECHNIQUE: Multidetector CT imaging of the chest was performed using the
standard protocol during bolus administration of intravenous
contrast. Multiplanar CT image reconstructions and MIPs were
obtained to evaluate the vascular anatomy.
CONTRAST:  75mL OMNIPAQUE IOHEXOL 350 MG/ML SOLN

[Series 6: thins · axial · 0.98mm/px · z∈[+1096,+1349]mm · 15 of 279 slices shown]
[im 13/279  lung]
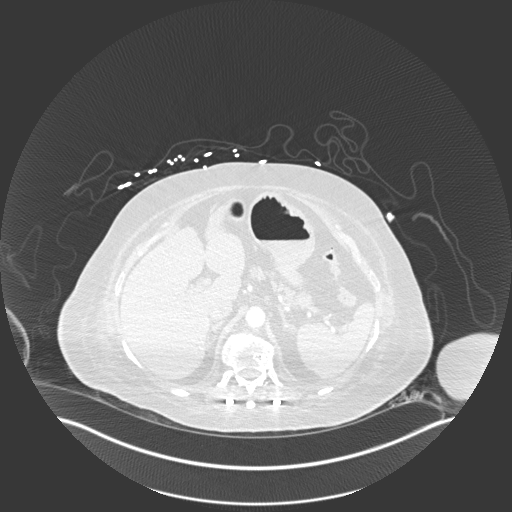
[im 37/279  soft-tissue]
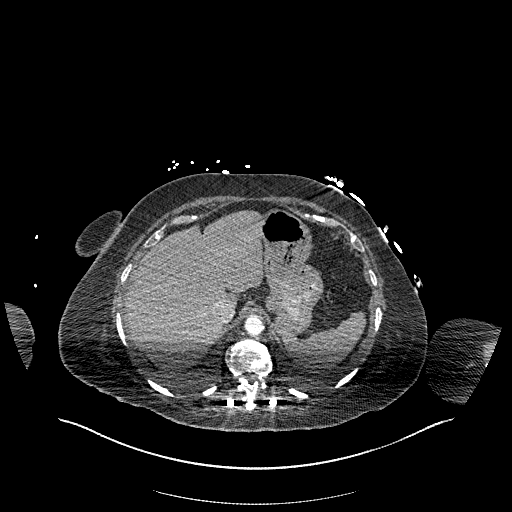
[im 49/279  lung]
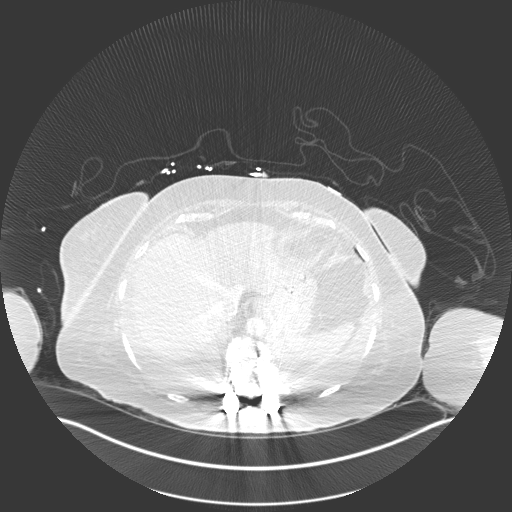
[im 73/279  soft-tissue]
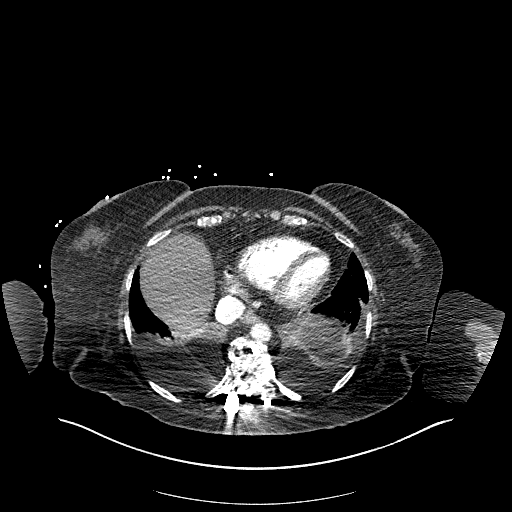
[im 85/279  lung]
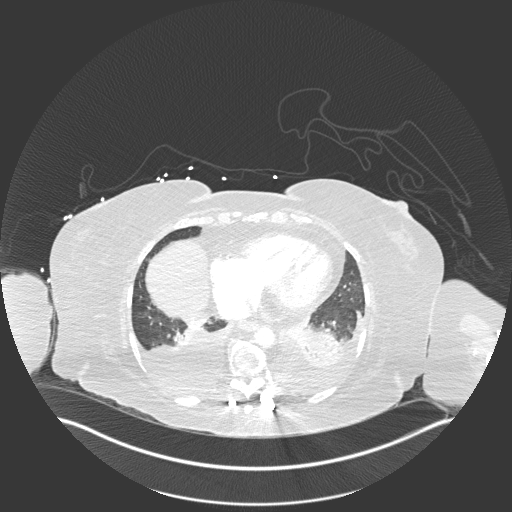
[im 109/279  soft-tissue]
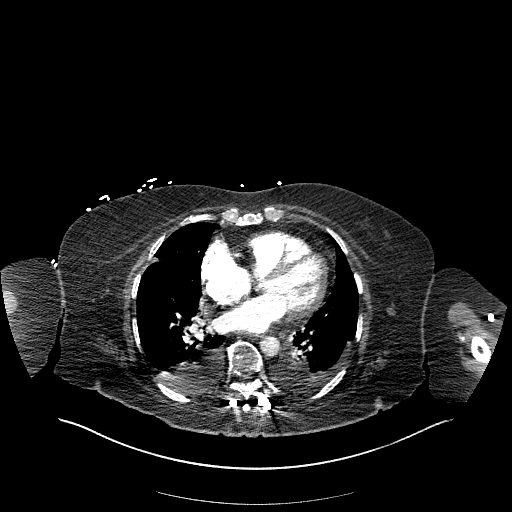
[im 121/279  lung]
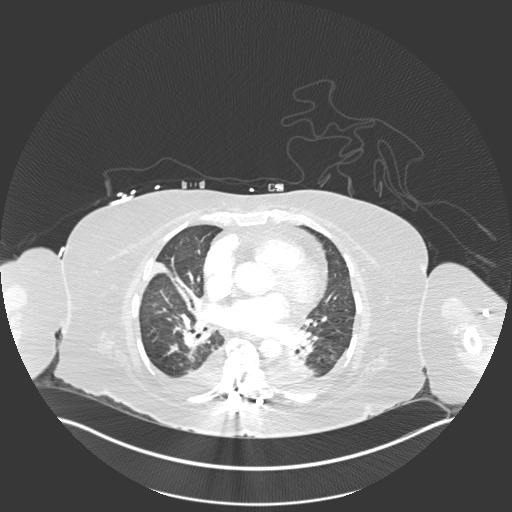
[im 146/279  soft-tissue]
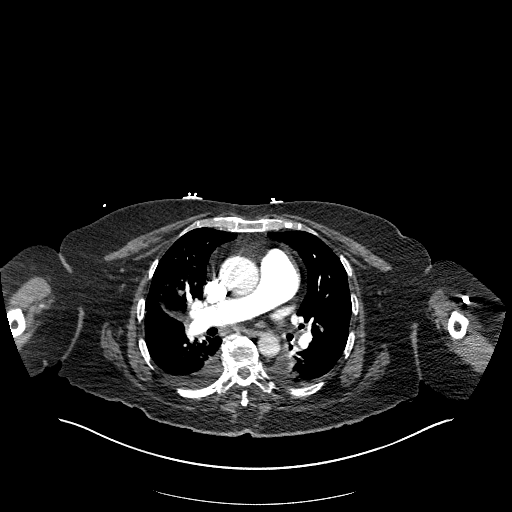
[im 158/279  lung]
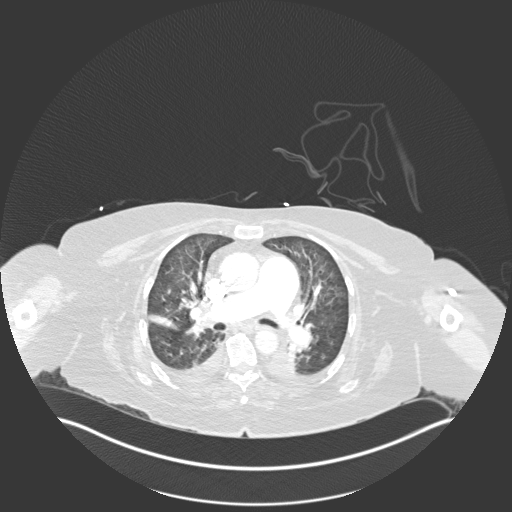
[im 170/279  soft-tissue]
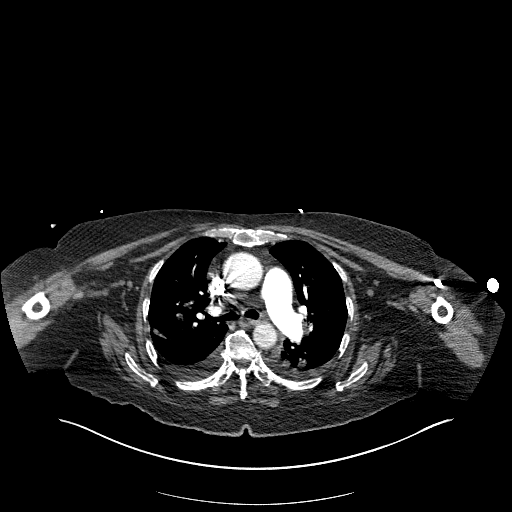
[im 194/279  lung]
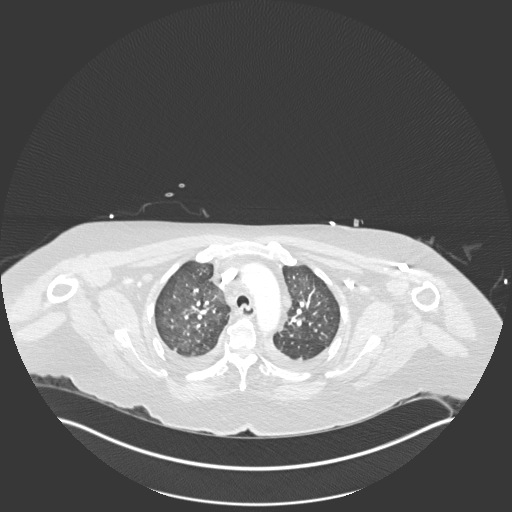
[im 206/279  soft-tissue]
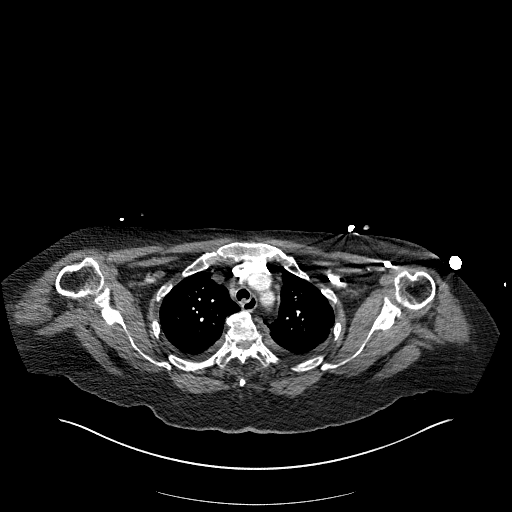
[im 230/279  lung]
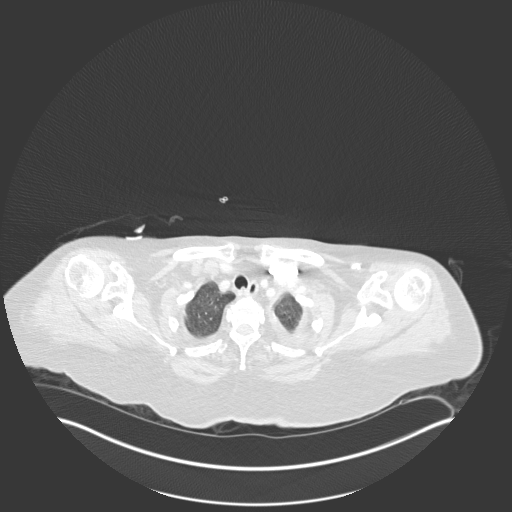
[im 242/279  soft-tissue]
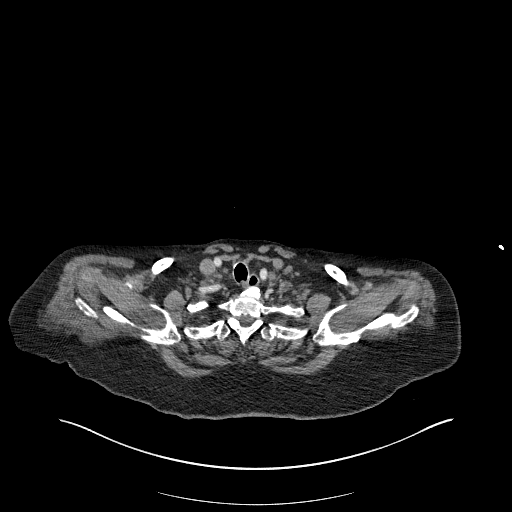
[im 266/279  lung]
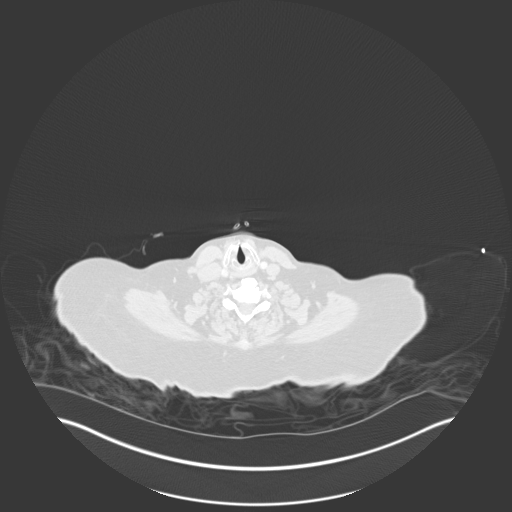

[Series 8: coronal mpr · coronal · 0.59mm/px · 3 of 151 slices shown]
[im 38/151  soft-tissue]
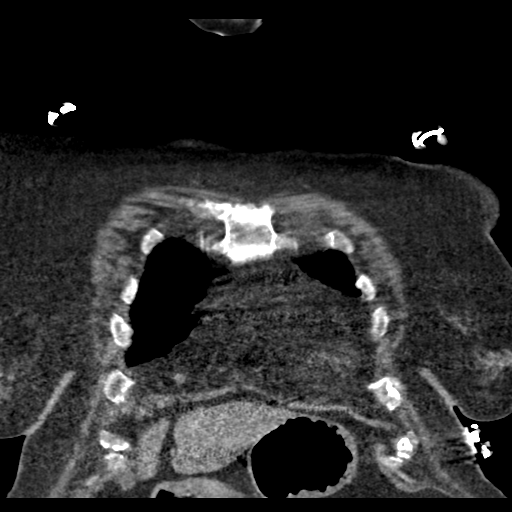
[im 76/151  soft-tissue]
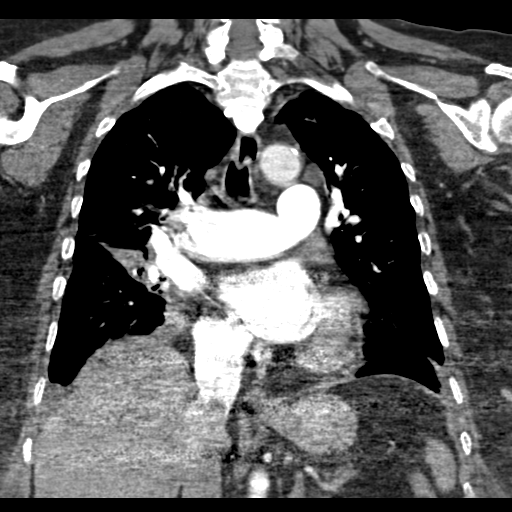
[im 113/151  soft-tissue]
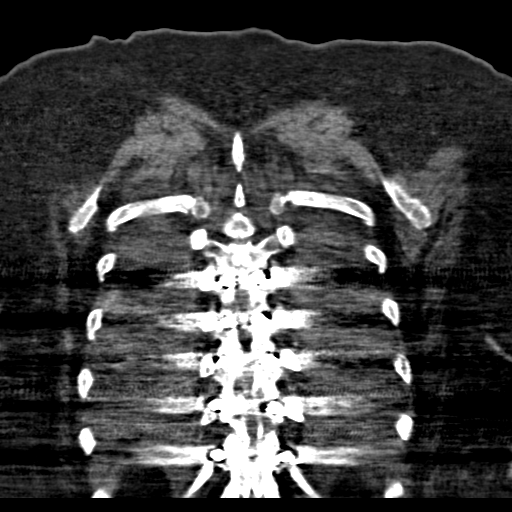

[18 of 46 positions shown; findings below may reference images not displayed]

FINDINGS: Cardiovascular: There are no filling defects within the pulmonary
arteries to suggest pulmonary embolus. Subsegmental branches are not
well assessed. Upper normal heart size. Contrast refluxes minimally
into the hepatic veins. Aortic atherosclerosis without dissection or
acute aortic findings. Conventional branching pattern from the
aortic arch. Occasional coronary artery calcifications. No
pericardial effusion.

Mediastinum/Nodes: Scattered small mediastinal lymph nodes, largest
in the prevascular station measuring 11 mm. Shotty bilateral hilar
nodes. No axillary adenopathy. No visualized thyroid nodule. No
esophageal wall thickening.

Lungs/Pleura: Moderate bilateral pleural effusions with adjacent
compressive atelectasis. Partial atelectasis involving the
perifissural right middle lobe, there is no evidence of central
obstructing lesion. Mild patchy and ground-glass opacities in the
anterior right upper lobe, series 7, image 37, and occasionally in
the left upper lobe, series 7, image 24.

Upper Abdomen: Minimal contrast refluxing into the hepatic veins. No
other acute findings.

Musculoskeletal: Posterior fusion in the lower thoracic spine with
intact hardware. There are no acute or suspicious osseous
abnormalities.

Review of the MIP images confirms the above findings.
IMPRESSION: 1. No pulmonary embolus.
2. Moderate bilateral pleural effusions with adjacent compressive
atelectasis. Partial atelectasis involving the perifissural right
middle lobe, no evidence of central obstructing lesion.
3. Mild patchy and ground-glass opacities in the anterior right
upper lobe and occasionally in the left upper lobe, likely
infectious bronchiolitis or other inflammatory process. Findings are
not typical of pulmonary edema.
4. Borderline cardiomegaly. Contrast refluxing into the hepatic
veins suggesting elevated right heart pressures.
5. Mildly enlarged prevascular node is likely reactive.

Aortic Atherosclerosis ([2Q]-[2Q]).

## 2020-05-22 IMAGING — DX DG CHEST 1V PORT
1 series · 1 of 1 positions shown · non-contrast
Comparison: [DATE].

CLINICAL DATA: Shortness of breath.

EXAM:
PORTABLE CHEST 1 VIEW

[chest ap]
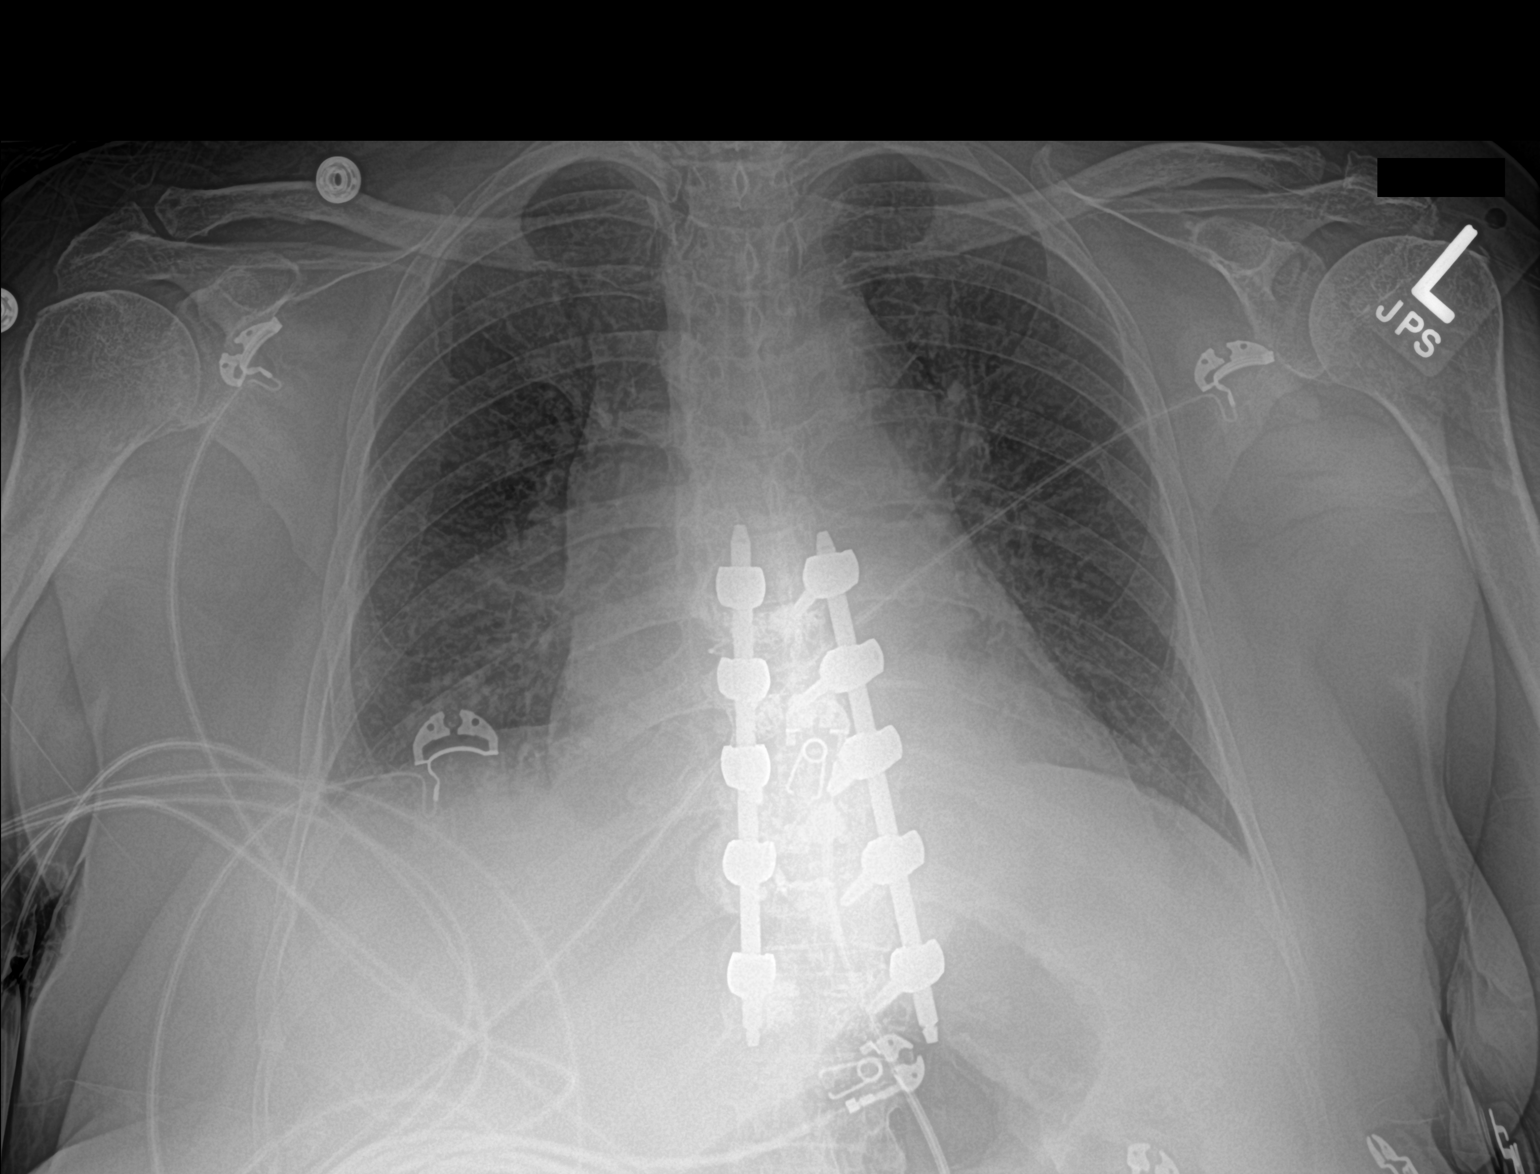

[1 of 1 positions shown; findings below may reference images not displayed]

FINDINGS: Stable cardiomediastinal silhouette. No pneumothorax is noted. Left
lung is clear. Mild right infrahilar opacity is noted which may
represent atelectasis or infiltrate. Surgical posterior fusion of
lower thoracic spine is noted.
IMPRESSION: Mild right infrahilar opacity is noted which may represent
atelectasis or infiltrate.

## 2020-05-22 MED ORDER — SODIUM CHLORIDE 0.9 % IV SOLN
2.0000 g | INTRAVENOUS | Status: DC
  Administered 2020-05-23: 2 g via INTRAVENOUS
  Filled 2020-05-22: qty 2
  Filled 2020-05-22: qty 20

## 2020-05-22 MED ORDER — SENNOSIDES 8.8 MG/5ML PO SYRP
10.0000 mL | ORAL_SOLUTION | Freq: Two times a day (BID) | ORAL | Status: DC
  Administered 2020-05-22 – 2020-05-24 (×5): 10 mL via ORAL
  Filled 2020-05-22 (×9): qty 10

## 2020-05-22 MED ORDER — AZITHROMYCIN 250 MG PO TABS
500.0000 mg | ORAL_TABLET | Freq: Every day | ORAL | Status: DC
  Administered 2020-05-23: 500 mg via ORAL
  Filled 2020-05-22: qty 2

## 2020-05-22 MED ORDER — LEVOTHYROXINE SODIUM 75 MCG PO TABS: 75.0000 ug | ORAL_TABLET | Freq: Every day | ORAL | Status: DC

## 2020-05-22 MED ORDER — DORZOLAMIDE HCL-TIMOLOL MAL 2-0.5 % OP SOLN
1.0000 [drp] | Freq: Two times a day (BID) | OPHTHALMIC | Status: DC
  Administered 2020-05-22 – 2020-05-25 (×6): 1 [drp] via OPHTHALMIC
  Filled 2020-05-22: qty 10

## 2020-05-22 MED ORDER — METOPROLOL SUCCINATE ER 100 MG PO TB24
100.0000 mg | ORAL_TABLET | Freq: Every day | ORAL | Status: DC
  Administered 2020-05-22 – 2020-05-25 (×4): 100 mg via ORAL
  Filled 2020-05-22 (×4): qty 1

## 2020-05-22 MED ORDER — SODIUM CHLORIDE 0.9 % IV SOLN
1.0000 g | Freq: Once | INTRAVENOUS | Status: AC
  Administered 2020-05-22: 1 g via INTRAVENOUS
  Filled 2020-05-22: qty 10

## 2020-05-22 MED ORDER — DILTIAZEM HCL ER COATED BEADS 180 MG PO CP24
180.0000 mg | ORAL_CAPSULE | Freq: Every day | ORAL | Status: DC
  Administered 2020-05-23 – 2020-05-25 (×3): 180 mg via ORAL
  Filled 2020-05-22 (×4): qty 1

## 2020-05-22 MED ORDER — INSULIN GLARGINE 100 UNIT/ML ~~LOC~~ SOLN
10.0000 [IU] | Freq: Every day | SUBCUTANEOUS | Status: DC
  Administered 2020-05-22 – 2020-05-24 (×3): 10 [IU] via SUBCUTANEOUS
  Filled 2020-05-22 (×5): qty 0.1

## 2020-05-22 MED ORDER — POTASSIUM CHLORIDE 10 MEQ/100ML IV SOLN
10.0000 meq | Freq: Once | INTRAVENOUS | Status: AC
  Administered 2020-05-22: 10 meq via INTRAVENOUS
  Filled 2020-05-22: qty 100

## 2020-05-22 MED ORDER — SODIUM CHLORIDE 0.9 % IV SOLN: INTRAVENOUS | Status: DC

## 2020-05-22 MED ORDER — RIVAROXABAN 15 MG PO TABS
15.0000 mg | ORAL_TABLET | Freq: Every day | ORAL | Status: DC
  Administered 2020-05-22 – 2020-05-23 (×2): 15 mg via ORAL
  Filled 2020-05-22 (×2): qty 1

## 2020-05-22 MED ORDER — TRAVOPROST (BAK FREE) 0.004 % OP SOLN
1.0000 [drp] | Freq: Every day | OPHTHALMIC | Status: DC
  Administered 2020-05-22 – 2020-05-24 (×3): 1 [drp] via OPHTHALMIC
  Filled 2020-05-22 (×2): qty 2.5

## 2020-05-22 MED ORDER — OXYCODONE-ACETAMINOPHEN 5-325 MG PO TABS: 1.0000 | ORAL_TABLET | Freq: Four times a day (QID) | ORAL | Status: DC | PRN

## 2020-05-22 MED ORDER — AZITHROMYCIN 250 MG PO TABS
500.0000 mg | ORAL_TABLET | Freq: Once | ORAL | Status: AC
  Administered 2020-05-22: 500 mg via ORAL
  Filled 2020-05-22: qty 2

## 2020-05-22 MED ORDER — ALBUTEROL SULFATE (2.5 MG/3ML) 0.083% IN NEBU
2.5000 mg | INHALATION_SOLUTION | Freq: Four times a day (QID) | RESPIRATORY_TRACT | Status: DC
  Administered 2020-05-22 – 2020-05-23 (×3): 2.5 mg via RESPIRATORY_TRACT
  Filled 2020-05-22 (×4): qty 3

## 2020-05-22 MED ORDER — POTASSIUM CHLORIDE CRYS ER 20 MEQ PO TBCR
40.0000 meq | EXTENDED_RELEASE_TABLET | Freq: Once | ORAL | Status: AC
  Administered 2020-05-22: 40 meq via ORAL
  Filled 2020-05-22: qty 2

## 2020-05-22 MED ORDER — LIDOCAINE 5 % EX PTCH
1.0000 | MEDICATED_PATCH | Freq: Every day | CUTANEOUS | Status: DC
  Administered 2020-05-25: 1 via TRANSDERMAL
  Filled 2020-05-22 (×2): qty 1

## 2020-05-22 MED ORDER — IOHEXOL 350 MG/ML SOLN
75.0000 mL | Freq: Once | INTRAVENOUS | Status: AC | PRN
  Administered 2020-05-22: 75 mL via INTRAVENOUS

## 2020-05-22 MED ORDER — LEVOTHYROXINE SODIUM 75 MCG PO TABS
75.0000 ug | ORAL_TABLET | Freq: Every day | ORAL | Status: DC
  Administered 2020-05-23 – 2020-05-25 (×3): 75 ug via ORAL
  Filled 2020-05-22 (×3): qty 1

## 2020-05-22 MED ORDER — POTASSIUM CHLORIDE CRYS ER 20 MEQ PO TBCR
40.0000 meq | EXTENDED_RELEASE_TABLET | Freq: Every day | ORAL | Status: DC
  Administered 2020-05-23 – 2020-05-24 (×2): 40 meq via ORAL
  Filled 2020-05-22 (×3): qty 2

## 2020-05-22 MED ORDER — PANTOPRAZOLE SODIUM 40 MG PO TBEC
40.0000 mg | DELAYED_RELEASE_TABLET | Freq: Every day | ORAL | Status: DC
  Administered 2020-05-23 – 2020-05-25 (×3): 40 mg via ORAL
  Filled 2020-05-22 (×3): qty 1

## 2020-05-22 MED ORDER — METFORMIN HCL 500 MG PO TABS
1000.0000 mg | ORAL_TABLET | Freq: Every day | ORAL | Status: DC
  Administered 2020-05-23 – 2020-05-25 (×3): 1000 mg via ORAL
  Filled 2020-05-22 (×3): qty 2

## 2020-05-22 NOTE — ED Notes (Addendum)
This nurse unable to obtain IV access on pt- phlebotomy consulted for labs- unable to obtain, IV team consult placed. EDP aware.

## 2020-05-22 NOTE — ED Notes (Signed)
Pt provided with beverage and light snacks.

## 2020-05-22 NOTE — ED Triage Notes (Addendum)
Pt to ED via EMS c/o [facility] from [facility]; Per facility pt had sudden onset [place] [facility], and was 88% on Room air. Pt wheezing, Pt COVID vaccinated.  Pt not voicing any complaints, No Medications given by EMS . [place] form accompanies pt- last VS: 143/91, 100 %4L, P 100 -130 (HX A FIB) RR 22, HX: DM2, CHF.

## 2020-05-22 NOTE — H&P (Signed)
HPI  Crystal Ashley GNF:[id number] DOB: Apr 22, 1946 DOA: 05/22/2020  PCP: [name], PA   Chief Complaint: SOB  HPI:  74 year old female Crystal Ashley Atrial fibrillation CHADS2 score >4 on Xarelto, DM TY 2 Stage I uterine cancer status post hysterectomy hypothyroidism Prior admission 10/2018 for lobar pneumonia Anemia of chronic disease Recent history of fall 01/31/2020 with ongoing right hip pain with pelvic fractures T10/T11 fracture status post surgical decompression with fusion   Presents to [facility] 10/6 with SOB from home--I tried to call son [name] [phone]. This am when he saw her she looked poorly-she was having trouble breathing-the nurse put a pulse ox on and it was 74--she was given oxygen and she was brought to the ED. She hasn't had any illnesses lately and has been well--has been at [facility] supposed to be released this Friday--she had made some progress Her pain had subsided-still some immobile--she gets around with walk on walker--baseline 5-10 steps  baseline 183-185  No cognitive issues really--some mild forgetfulness   Review of Systems:  Difficult to obtain secondary to language barrier and may be some dementia but overall states that her breathing is better she has no chest pain she has no dark stool and she has no burning in the urine  ED Course: Started on broad-spectrum azithromycin ceftriaxone replace potassium x1 with one dose of KCl 10 and given KCl 40 kept on cardiac monitors   Past Medical History:  Diagnosis Date  . Atrial fibrillation ([place])   . Cancer ([place])   . Cataract   . Fracture 2001   left ankle  . Glaucoma   . Hypertension   . Thyroid disease    Past Surgical History:  Procedure Laterality Date  . APPLICATION OF ROBOTIC ASSISTANCE FOR SPINAL PROCEDURE N/A 02/26/2020   Procedure: APPLICATION OF ROBOTIC ASSISTANCE FOR SPINAL PROCEDURE;  Surgeon: [name], MD;  Location: [place];  Service: Neurosurgery;  Laterality:  N/A;  . CARDIOVERSION N/A 04/08/2018   Procedure: CARDIOVERSION;  Surgeon: Adrian Prows, MD;  Location: [facility] ENDOSCOPY;  Service: Cardiovascular;  Laterality: N/A;  . CARDIOVERSION N/A 05/22/2019   Procedure: CARDIOVERSION;  Surgeon: Adrian Prows, MD;  Location: [place];  Service: Cardiovascular;  Laterality: N/A;  . FOOT SURGERY Left   . HYSTEROSCOPY     POLYPECTOMY  . LUMBAR PERCUTANEOUS PEDICLE SCREW 4 LEVEL N/A 02/26/2020   Procedure: Thoracic eight to thoracic twelve posterior percutaneous instrumentation with cement augmentation and bilateral medial facetectomy for decompression at Thoracic ten-eleven;  Surgeon: [name], MD;  Location: [place];  Service: Neurosurgery;  Laterality: N/A;    reports that she has never smoked. She has never used smokeless tobacco. She reports that she does not drink alcohol and does not use drugs.  Mobility: With wheelchair as above  No Known Allergies Family History  Problem Relation Age of Onset  . Diabetes Son   . Healthy Mother   . Healthy Father    Prior to Admission medications   Medication Sig Start Date End Date Taking? Authorizing Provider  acetaminophen (TYLENOL) 500 MG tablet Take 1,000 mg by mouth every 8 (eight) hours.   Yes [provider]  atorvastatin (LIPITOR) 10 MG tablet Take 1 tablet (10 mg total) by mouth daily. Patient taking differently: Take 10 mg by mouth at bedtime.  12/01/19  Yes [name], PA  calcium carbonate (TUMS - DOSED IN MG ELEMENTAL CALCIUM) 500 MG chewable tablet Chew 2 tablets by mouth daily.   Yes [provider]  diltiazem (CARDIZEM) 60 MG tablet Take 60 mg by mouth 3 (three) times daily.   Yes [provider]  dorzolamide-timolol (COSOPT) 22.3-6.8 MG/ML ophthalmic solution Place 1 drop into both eyes 2 (two) times daily.  10/05/17  Yes [provider]  furosemide (LASIX) 20 MG tablet Take 20 mg by mouth daily.   Yes [provider]  insulin aspart  (NOVOLOG) 100 UNIT/ML injection Inject 0-15 Units into the skin 3 (three) times daily with meals. Patient taking differently: Inject 0-12 Units into the skin 3 (three) times daily with meals. Blood Sugar  70-200=0 units 201-250=2 units 251-300=4 units 301-350=6 units 351-400=8 units 401-450=10 units 451-500=12 units BS>500 Call NP/PA 03/01/20  Yes [name], MD  insulin glargine (LANTUS) 100 UNIT/ML injection Inject 0.1 mLs (10 Units total) into the skin at bedtime. Patient taking differently: Inject 14 Units into the skin at bedtime.  03/01/20  Yes [name], MD  levothyroxine (SYNTHROID) 75 MCG tablet Take 1 tablet (75 mcg total) by mouth daily before breakfast. 12/01/19  Yes [name], PA  Lidocaine (ASPERCREME LIDOCAINE) 4 % PTCH Apply 1 patch topically in the morning and at bedtime.   Yes [provider]  Menthol, Topical Analgesic, (BIOFREEZE) 4 % GEL Apply 1 application topically daily.   Yes [provider]  metoprolol succinate (TOPROL-XL) 25 MG 24 hr tablet Take 75 mg by mouth daily.   Yes [provider]  ondansetron (ZOFRAN) 4 MG tablet Take 4 mg by mouth every 8 (eight) hours as needed for nausea or vomiting.   Yes [provider]  pantoprazole (PROTONIX) 40 MG tablet Take 1 tablet (40 mg total) by mouth daily. Patient taking differently: Take 40 mg by mouth 2 (two) times daily.  03/01/20  Yes [name], MD  polyethylene glycol (MIRALAX / GLYCOLAX) 17 g packet Take 17 g by mouth daily as needed for mild constipation. Patient taking differently: Take 17 g by mouth daily.  02/12/20  Yes Irene Pap N, DO  potassium chloride (MICRO-K) 10 MEQ CR capsule Take 10 mEq by mouth daily.   Yes [provider]  Rivaroxaban (XARELTO) 15 MG TABS tablet Take 1 tablet (15 mg total) by mouth daily with supper. Patient taking differently: Take 15 mg by mouth daily.  05/22/19  Yes Adrian Prows, MD  sennosides (SENOKOT) 8.8  MG/5ML syrup Take 10 mLs by mouth 2 (two) times daily.   Yes [provider]  travoprost, benzalkonium, (TRAVATAN) 0.004 % ophthalmic solution Place 1 drop into both eyes at bedtime.    Yes [provider]  vitamin B-12 (CYANOCOBALAMIN) 250 MCG tablet Take 250 mcg by mouth daily.   Yes [provider]  cyanocobalamin (,VITAMIN B-12,) 1000 MCG/ML injection 1000 mcg (1 mg) injection once per week for four weeks, followed by 1000 mcg injection once per month. Patient not taking: Reported on 05/22/2020 09/23/18   Briscoe Deutscher, DO  diltiazem (CARDIZEM CD) 180 MG 24 hr capsule Take 1 capsule (180 mg total) by mouth daily. Patient not taking: Reported on 05/22/2020 03/23/20   Adrian Prows, MD  feeding supplement, ENSURE ENLIVE, (ENSURE ENLIVE) LIQD Take 237 mLs by mouth 2 (two) times daily between meals. Patient not taking: Reported on 05/22/2020 03/01/20   [name], MD  furosemide (LASIX) 40 MG tablet Take 1 tablet (40 mg total) by mouth daily. Patient not taking: Reported on 05/22/2020 11/29/19   [name], PA  insulin aspart (NOVOLOG) 100 UNIT/ML  injection Inject 0-5 Units into the skin at bedtime. Patient not taking: Reported on 05/22/2020 03/01/20   [name], MD  metFORMIN (GLUCOPHAGE) 1000 MG tablet Take 1 tablet (1,000 mg total) by mouth daily with breakfast. Patient not taking: Reported on 05/22/2020 12/01/19   [name], PA  methocarbamol (ROBAXIN) 500 MG tablet Take 1 tablet (500 mg total) by mouth every 6 (six) hours as needed for muscle spasms. Patient not taking: Reported on 05/22/2020 03/01/20   [name], MD  metoprolol succinate (TOPROL-XL) 100 MG 24 hr tablet Take 1 tablet (100 mg total) by mouth daily. Take with or immediately following a meal. Patient not taking: Reported on 05/22/2020 12/01/19   [name], PA  oxyCODONE-acetaminophen (PERCOCET/ROXICET) 5-325 MG tablet Take 1-2 tablets by mouth every 6 (six) hours as needed for  moderate pain or severe pain. Patient not taking: Reported on 05/22/2020 03/01/20   [name], MD    Physical Exam:  Vitals:   05/22/20 1630 05/22/20 1715  BP: (!) 132/110 (!) 154/69  Pulse: (!) 121 (!) 125  Resp: (!) 28 (!) 26  Temp:    SpO2: 100% 98%     Alert oriented  I have personally reviewed following labs and imaging studies  Labs:   Potassium 3.0 BUNs/creatinine 10/0.9  CO2 34  BNP 270  WBC 12.5  Hemoglobin 9.9  Platelet 382  Imaging studies:   No pulmonary embolism on CT moderate effusions adjacent compressive atelectasis no centrally obstructing lesion patchy groundglass opacities anterior right upper occasional left now with infectious bronchiolitis contrast refluxing into hepatic veins  Medical tests:   EKG independently reviewed: A. fib rate controlled with no T wave inversions  Test discussed with performing physician:  Yes  Decision to obtain old records:   Yes  Review and summation of old records:   S  Active Problems:   * No active hospital problems. *   Assessment/Plan Probable pneumonia CT scan shows more likely not pneumonia and given setting of leukocytosis and also shortness of breath with relief with oxygen we will treat the same BNP could be elevated secondary to possible pneumonia in addition and she does not seem volume overloaded and her weight is close to her normal so I do not think this is the culprit as her exam is nonrevealing We will repeat a chest x-ray two-view tomorrow Continue azithromycin and ceftriaxone Start fluids at 50 cc an hour I would hold prior to admission Lasix 20 daily at this time  Atrial fibrillation rate controlled CHADS2 score >4 on Xarelto  Currently is not rate controlled therefore I will restart Cardizem 180 CD in  addition to Toprol-XL 100 daily  Will resume Xarelto as well  Magnesium is pending and will need to be repleted further issues  Mild hypokalemia   Replace orally if  possible patient can have a diet  Hypothyroidism  Replace TSH with thyroxine at usual home dose  Recent fall and fractures  Stays at [place] center may need to return there we'll ask PT OT to see and evaluate  DM TY 2  Can resume Metformin 1000 twice daily as well as 14 units of Lantus daily   Stage I uterine cancer status post hysterectomy  Polypharmacy  Multiple medications on list that are not clear-pharmacy should review the same and reevaluate need for some of them during hospital stay  Severity of Illness: The appropriate patient status for this patient is INPATIENT. Inpatient status is judged to  be reasonable and necessary in order to provide the required intensity of service to ensure the patient's safety. The patient's presenting symptoms, physical exam findings, and initial radiographic and laboratory data in the context of their chronic comorbidities is felt to place them at high risk for further clinical deterioration. Furthermore, it is not anticipated that the patient will be medically stable for discharge from the hospital within 2 midnights of admission. The following factors support the patient status of inpatient.   " The patient's presenting symptoms include pna. " The worrisome physical exam findings includepan. " The initial radiographic and laboratory data are worrisome because of pna. " The chronic co-morbidities include afib.   * I certify that at the point of admission it is my clinical judgment that the patient will require inpatient hospital care spanning beyond 2 midnights from the point of admission due to high intensity of service, high risk for further deterioration and high frequency of surveillance required.*    DVT prophylaxis:xarelto Code Status: DNR confirmed with son Family Communication: son Consults called: none    Time spent: 33 minutes  Verlon Au, MD Jerl Mina my NP partners at night for Care related issues] [facility] --Via  [employer] [facility] , [email or web address]; password [facility]  05/22/2020, 5:22 PM

## 2020-05-22 NOTE — ED Notes (Signed)
This RN assisted this pt in changing her brief. Pt currently clean and dry.

## 2020-05-22 NOTE — ED Provider Notes (Signed)
Seemed care of patient at 3:30 PM from Dr. Rex Kras.  Patient CT PE study came back negative for PE but does show groundglass opacity concerning for most likely infection especially with increasing white count of 12.5 and new oxygen requirement.  Patient was covered with antibiotics for pneumonia.  Will admit for further care.  Covid is negative.   Blanchie Dessert, MD 05/22/20 1724

## 2020-05-22 NOTE — ED Notes (Signed)
Admitting at Bedside.

## 2020-05-22 NOTE — ED Provider Notes (Signed)
[facility] EMERGENCY DEPARTMENT Provider Note   CSN: [id number] Arrival date & time: 05/22/20  2979     History Chief Complaint  Patient presents with  . Shortness of Breath    Crystal Ashley is a 74 y.o. female.  74 year old female with past medical history below including hypertension, atrial fibrillation on anticoagulation who presents with shortness of breath.  Patient was at her nursing facility today where she had a relatively sudden onset of shortness of breath.  She denies any associated chest pain.  No significant cough and denies any nasal congestion, sore throat, fevers, or vomiting.  She has been taking medications as prescribed.  No lower extremity edema.  The history is provided by the patient and a relative.  Shortness of Breath      Past Medical History:  Diagnosis Date  . Atrial fibrillation ([place])   . Cancer ([place])   . Cataract   . Fracture 2001   left ankle  . Glaucoma   . Hypertension   . Thyroid disease     Patient Active Problem List   Diagnosis Date Noted  . Elective surgery   . Fusion of spine, thoracolumbar region 02/26/2020  . Acute blood loss anemia   . PAF (paroxysmal atrial fibrillation) ([place])   . Controlled type 2 diabetes mellitus with hyperglycemia, without long-term current use of insulin ([place])   . Fracture of vertebra due to osteoporosis ([place])   . Acute midline thoracic back pain   . Multiple traumatic injuries 02/12/2020  . Closed fracture of multiple pubic rami, right, initial encounter ([place]) 02/07/2020  . Atrial fibrillation, chronic ([place]) 02/07/2020  . Chronic combined systolic (congestive) and diastolic (congestive) heart failure ([place])   . Acute respiratory failure with hypoxia ([place]) 10/18/2018  . Declining mobility 09/23/2018  . B12 deficiency 09/23/2018  . Morbid obesity with BMI of 40.0-44.9, adult ([place]) 09/23/2018  . Anemia 09/23/2018  . History of cardioversion 04/26/2018  . Type 2 diabetes  mellitus without complication, without long-term current use of insulin ([place]) 03/15/2018  . Atrial fibrillation with RVR ([place]) 03/15/2018  . Morbid obesity ([place]) 01/04/2017  . Hyperlipidemia associated with type 2 diabetes mellitus ([place]) 01/04/2017  . Vitamin D deficiency 12/15/2016  . Glaucoma   . Hypertension associated with diabetes ([place]) 04/19/2014  . Hypothyroidism 04/19/2014  . History of endometrial cancer 03/30/2014    Past Surgical History:  Procedure Laterality Date  . APPLICATION OF ROBOTIC ASSISTANCE FOR SPINAL PROCEDURE N/A 02/26/2020   Procedure: APPLICATION OF ROBOTIC ASSISTANCE FOR SPINAL PROCEDURE;  Surgeon: [name], MD;  Location: [place];  Service: Neurosurgery;  Laterality: N/A;  . CARDIOVERSION N/A 04/08/2018   Procedure: CARDIOVERSION;  Surgeon: Adrian Prows, MD;  Location: [facility] ENDOSCOPY;  Service: Cardiovascular;  Laterality: N/A;  . CARDIOVERSION N/A 05/22/2019   Procedure: CARDIOVERSION;  Surgeon: Adrian Prows, MD;  Location: [place];  Service: Cardiovascular;  Laterality: N/A;  . FOOT SURGERY Left   . HYSTEROSCOPY     POLYPECTOMY  . LUMBAR PERCUTANEOUS PEDICLE SCREW 4 LEVEL N/A 02/26/2020   Procedure: Thoracic eight to thoracic twelve posterior percutaneous instrumentation with cement augmentation and bilateral medial facetectomy for decompression at Thoracic ten-eleven;  Surgeon: [name], MD;  Location: [place];  Service: Neurosurgery;  Laterality: N/A;     OB History    Gravida  0   Para      Term      Preterm      AB  Living        SAB      TAB      Ectopic      Multiple      Live Births              Family History  Problem Relation Age of Onset  . Diabetes Son   . Healthy Mother   . Healthy Father     Social History   Tobacco Use  . Smoking status: Never Smoker  . Smokeless tobacco: Never Used  Vaping Use  . Vaping Use: Never used  Substance Use Topics  . Alcohol use: No  . Drug use: Never     Home Medications Prior to Admission medications   Medication Sig Start Date End Date Taking? Authorizing Provider  atorvastatin (LIPITOR) 10 MG tablet Take 1 tablet (10 mg total) by mouth daily. 12/01/19   [name], PA  cholecalciferol (VITAMIN D) 1000 UNITS tablet Take 5,000 Units by mouth See admin instructions. Dorene Grebe, Wed, friday    [provider]  cyanocobalamin (,VITAMIN B-12,) 1000 MCG/ML injection 1000 mcg (1 mg) injection once per week for four weeks, followed by 1000 mcg injection once per month. Patient taking differently: Inject 1,000 mcg into the muscle every 30 (thirty) days.  09/23/18   Briscoe Deutscher, DO  diltiazem (CARDIZEM CD) 180 MG 24 hr capsule Take 1 capsule (180 mg total) by mouth daily. 03/23/20   Adrian Prows, MD  dorzolamide-timolol (COSOPT) 22.3-6.8 MG/ML ophthalmic solution Place 1 drop into both eyes 2 (two) times daily.  10/05/17   [provider]  feeding supplement, ENSURE ENLIVE, (ENSURE ENLIVE) LIQD Take 237 mLs by mouth 2 (two) times daily between meals. 03/01/20   [name], MD  furosemide (LASIX) 40 MG tablet Take 1 tablet (40 mg total) by mouth daily. 11/29/19   [name], PA  insulin aspart (NOVOLOG) 100 UNIT/ML injection Inject 0-15 Units into the skin 3 (three) times daily with meals. 03/01/20   [name], MD  insulin aspart (NOVOLOG) 100 UNIT/ML injection Inject 0-5 Units into the skin at bedtime. 03/01/20   [name], MD  insulin glargine (LANTUS) 100 UNIT/ML injection Inject 0.1 mLs (10 Units total) into the skin at bedtime. 03/01/20   [name], MD  levothyroxine (SYNTHROID) 75 MCG tablet Take 1 tablet (75 mcg total) by mouth daily before breakfast. 12/01/19   [name], PA  metFORMIN (GLUCOPHAGE) 1000 MG tablet Take 1 tablet (1,000 mg total) by mouth daily with breakfast. 12/01/19   [name], PA  methocarbamol (ROBAXIN) 500 MG tablet Take 1 tablet (500 mg total) by mouth every 6  (six) hours as needed for muscle spasms. 03/01/20   [name], MD  metoprolol succinate (TOPROL-XL) 100 MG 24 hr tablet Take 1 tablet (100 mg total) by mouth daily. Take with or immediately following a meal. 12/01/19   [name], PA  oxyCODONE-acetaminophen (PERCOCET/ROXICET) 5-325 MG tablet Take 1-2 tablets by mouth every 6 (six) hours as needed for moderate pain or severe pain. 03/01/20   [name], MD  pantoprazole (PROTONIX) 40 MG tablet Take 1 tablet (40 mg total) by mouth daily. 03/01/20   [name], MD  polyethylene glycol (MIRALAX / GLYCOLAX) 17 g packet Take 17 g by mouth daily as needed for mild constipation. 02/12/20   Kayleen Memos, DO  Rivaroxaban (XARELTO) 15 MG TABS tablet Take 1 tablet (15 mg total) by mouth daily with supper.  Patient taking differently: Take 15 mg by mouth daily.  05/22/19   Adrian Prows, MD  travoprost, benzalkonium, (TRAVATAN) 0.004 % ophthalmic solution Place 1 drop into both eyes at bedtime.     [provider]    Allergies    Patient has no known allergies.  Review of Systems   Review of Systems  Respiratory: Positive for shortness of breath.    All other systems reviewed and are negative except that which was mentioned in HPI  Physical Exam Updated Vital Signs BP (!) 147/63   Pulse (!) 111   Temp 98.7 F (37.1 C) (Oral)   Resp (!) 28   Ht 5' (1.524 m)   Wt 85.3 kg   SpO2 100%   BMI 36.72 kg/m   Physical Exam Vitals and nursing note reviewed.  Constitutional:      General: She is not in acute distress.    Appearance: She is well-developed.  HENT:     Head: Normocephalic and atraumatic.  Eyes:     Conjunctiva/sclera: Conjunctivae normal.  Cardiovascular:     Rate and Rhythm: Normal rate. Rhythm irregular.     Heart sounds: Normal heart sounds. No murmur heard.   Pulmonary:     Effort: Tachypnea present. No respiratory distress.     Comments: Diminished BS b/l Abdominal:     General: Bowel sounds  are normal. There is no distension.     Palpations: Abdomen is soft.     Tenderness: There is no abdominal tenderness.  Musculoskeletal:     Cervical back: Neck supple.     Right lower leg: No edema.     Left lower leg: No edema.  Skin:    General: Skin is warm and dry.  Neurological:     Mental Status: She is alert and oriented to person, place, and time.     Comments: Fluent speech  Psychiatric:        Mood and Affect: Mood normal.        Judgment: Judgment normal.     ED Results / Procedures / Treatments   Labs (all labs ordered are listed, but only abnormal results are displayed) Labs Reviewed  COMPREHENSIVE METABOLIC PANEL - Abnormal; Notable for the following components:      Result Value   Potassium 3.0 (*)    Chloride 95 (*)    CO2 34 (*)    Glucose, Bld 199 (*)    Calcium 8.6 (*)    Total Protein 6.4 (*)    Albumin 2.5 (*)    GFR calc non Af Amer 58 (*)    All other components within normal limits  CBC WITH DIFFERENTIAL/PLATELET - Abnormal; Notable for the following components:   WBC 12.5 (*)    RBC 3.61 (*)    Hemoglobin 9.9 (*)    HCT 33.1 (*)    MCHC 29.9 (*)    RDW 19.1 (*)    Neutro Abs 10.4 (*)    Abs Immature Granulocytes 0.10 (*)    All other components within normal limits  BRAIN NATRIURETIC PEPTIDE - Abnormal; Notable for the following components:   B Natriuretic Peptide 270.8 (*)    All other components within normal limits  RESPIRATORY PANEL BY RT PCR (FLU A&B, COVID)  MAGNESIUM  TROPONIN I (HIGH SENSITIVITY)    EKG EKG Interpretation  Date/Time:  Wednesday May 22 2020 10:07:15 EDT Ventricular Rate:  113 PR Interval:    QRS Duration: 79 QT Interval:  386 QTC Calculation: 530  R Axis:   35 Text Interpretation: Atrial fibrillation Paired ventricular premature complexes Low voltage, extremity leads Nonspecific repol abnormality, diffuse leads Prolonged QT interval similar to previous Confirmed by Theotis Burrow [phone]) on 05/22/2020  10:22:44 AM   Radiology DG Chest Port 1 View  Result Date: 05/22/2020 CLINICAL DATA:  Shortness of breath. EXAM: PORTABLE CHEST 1 VIEW COMPARISON:  February 28, 2020. FINDINGS: Stable cardiomediastinal silhouette. No pneumothorax is noted. Left lung is clear. Mild right infrahilar opacity is noted which may represent atelectasis or infiltrate. Surgical posterior fusion of lower thoracic spine is noted. IMPRESSION: Mild right infrahilar opacity is noted which may represent atelectasis or infiltrate. Electronically Signed   By: Marijo Conception M.D.   On: 05/22/2020 11:02    Procedures Procedures (including critical care time)  Medications Ordered in ED Medications  potassium chloride 10 mEq in 100 mL IVPB (10 mEq Intravenous New Bag/Given 05/22/20 1447)  potassium chloride SA (KLOR-CON) CR tablet 40 mEq (40 mEq Oral Given 05/22/20 1447)    ED Course  I have reviewed the triage vital signs and the nursing notes.  Pertinent labs & imaging results that were available during my care of the patient were reviewed by me and considered in my medical decision making (see chart for details).    MDM Rules/Calculators/A&P                          Pt alert, tachypneic and mildly hypoxic requiring 2-3L O2 which is new for her. No respiratory distress. DDx includes pneumonia, PTX, heart failure, or PE although less likely given anticoagulation.   BNP 270, trop normal, COVID negative, WBC 12.5, Hgb 9.9, potassium 3, glucose 199, normal creatinine. CXR w/ ? R perihilar infiltrate.  I have ordered CTA of chest to rule out PE or evidence of pneumonia.  Patient signed out to oncoming provider Dr. Maryan Rued pending CTA results. I anticipate admission due to new hypoxia.  Final Cl inical Impression(s) / ED Diagnoses Final diagnoses:  None    Rx / DC Orders ED Discharge Orders    None       Oscar Forman, Wenda [place], MD 05/22/20 1547

## 2020-05-23 ENCOUNTER — Inpatient Hospital Stay (HOSPITAL_COMMUNITY): Payer: Medicare Other

## 2020-05-23 LAB — CBC
HCT: 30.2 % — ABNORMAL LOW (ref 36.0–46.0)
Hemoglobin: 8.5 g/dL — ABNORMAL LOW (ref 12.0–15.0)
MCH: 26.6 pg (ref 26.0–34.0)
MCHC: 28.1 g/dL — ABNORMAL LOW (ref 30.0–36.0)
MCV: 94.7 fL (ref 80.0–100.0)
Platelets: 337 10*3/uL (ref 150–400)
RBC: 3.19 MIL/uL — ABNORMAL LOW (ref 3.87–5.11)
RDW: 19.1 % — ABNORMAL HIGH (ref 11.5–15.5)
WBC: 7.3 10*3/uL (ref 4.0–10.5)
nRBC: 0 % (ref 0.0–0.2)

## 2020-05-23 LAB — COMPREHENSIVE METABOLIC PANEL
ALT: 9 U/L (ref 0–44)
AST: 13 U/L — ABNORMAL LOW (ref 15–41)
Albumin: 2.1 g/dL — ABNORMAL LOW (ref 3.5–5.0)
Alkaline Phosphatase: 74 U/L (ref 38–126)
Anion gap: 9 (ref 5–15)
BUN: 10 mg/dL (ref 8–23)
CO2: 33 mmol/L — ABNORMAL HIGH (ref 22–32)
Calcium: 8.4 mg/dL — ABNORMAL LOW (ref 8.9–10.3)
Chloride: 100 mmol/L (ref 98–111)
Creatinine, Ser: 0.94 mg/dL (ref 0.44–1.00)
GFR calc non Af Amer: 60 mL/min — ABNORMAL LOW (ref >60)
Glucose, Bld: 128 mg/dL — ABNORMAL HIGH (ref 70–99)
Potassium: 3.8 mmol/L (ref 3.5–5.1)
Sodium: 142 mmol/L (ref 135–145)
Total Bilirubin: 0.4 mg/dL (ref 0.3–1.2)
Total Protein: 5.6 g/dL — ABNORMAL LOW (ref 6.5–8.1)

## 2020-05-23 LAB — GLUCOSE, CAPILLARY
Glucose-Capillary: 111 mg/dL — ABNORMAL HIGH (ref 70–99)
Glucose-Capillary: 178 mg/dL — ABNORMAL HIGH (ref 70–99)
Glucose-Capillary: 112 mg/dL — ABNORMAL HIGH (ref 70–99)

## 2020-05-23 LAB — HIV ANTIBODY (ROUTINE TESTING W REFLEX): HIV Screen 4th Generation wRfx: NONREACTIVE

## 2020-05-23 IMAGING — CR DG CHEST 2V
2 series · 2 of 2 positions shown · non-contrast
Comparison: Chest CTA [DATE] and earlier.

CLINICAL DATA: 74-year-old female with pneumonia, weakness.

EXAM:
CHEST - 2 VIEW

[chest lat]
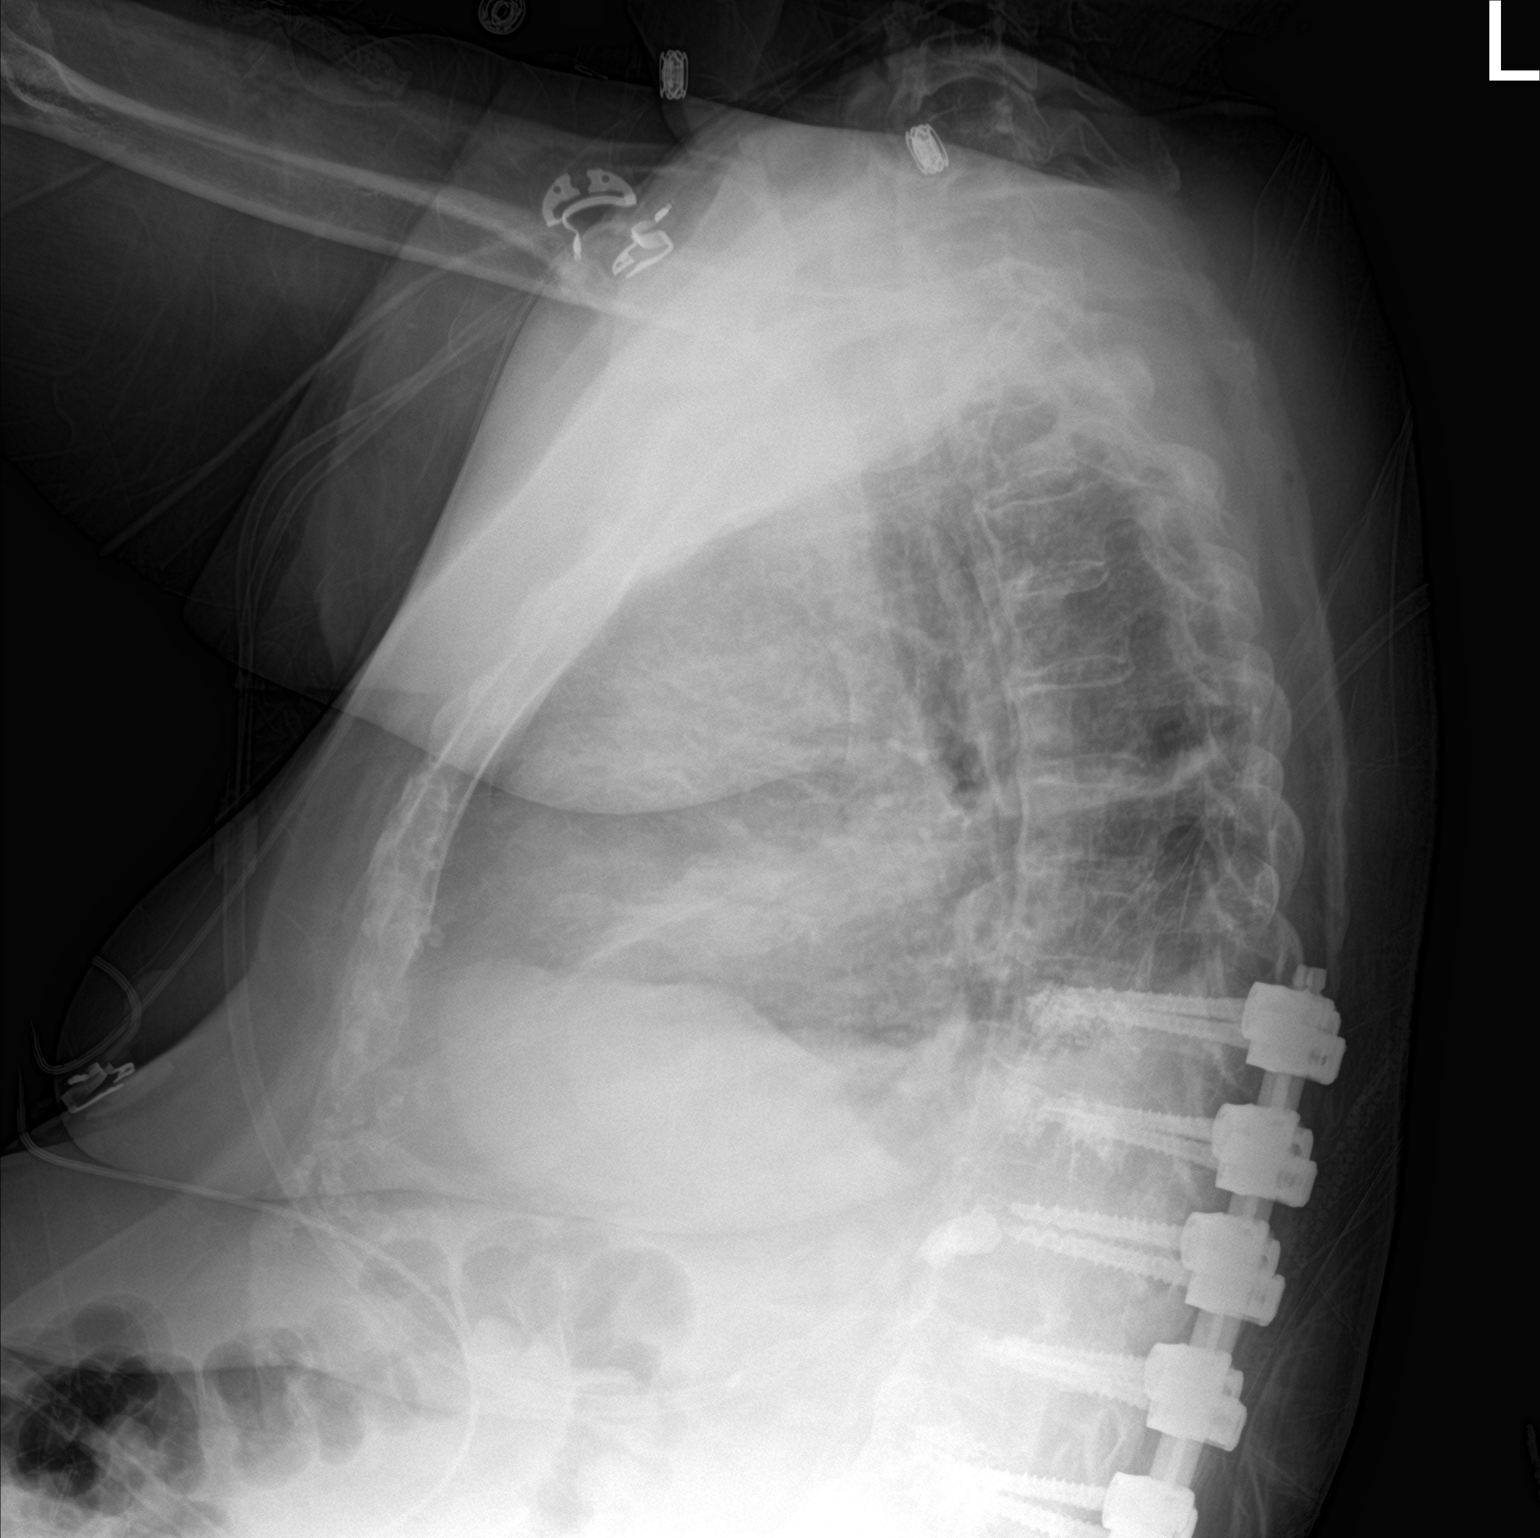

[chest ap]
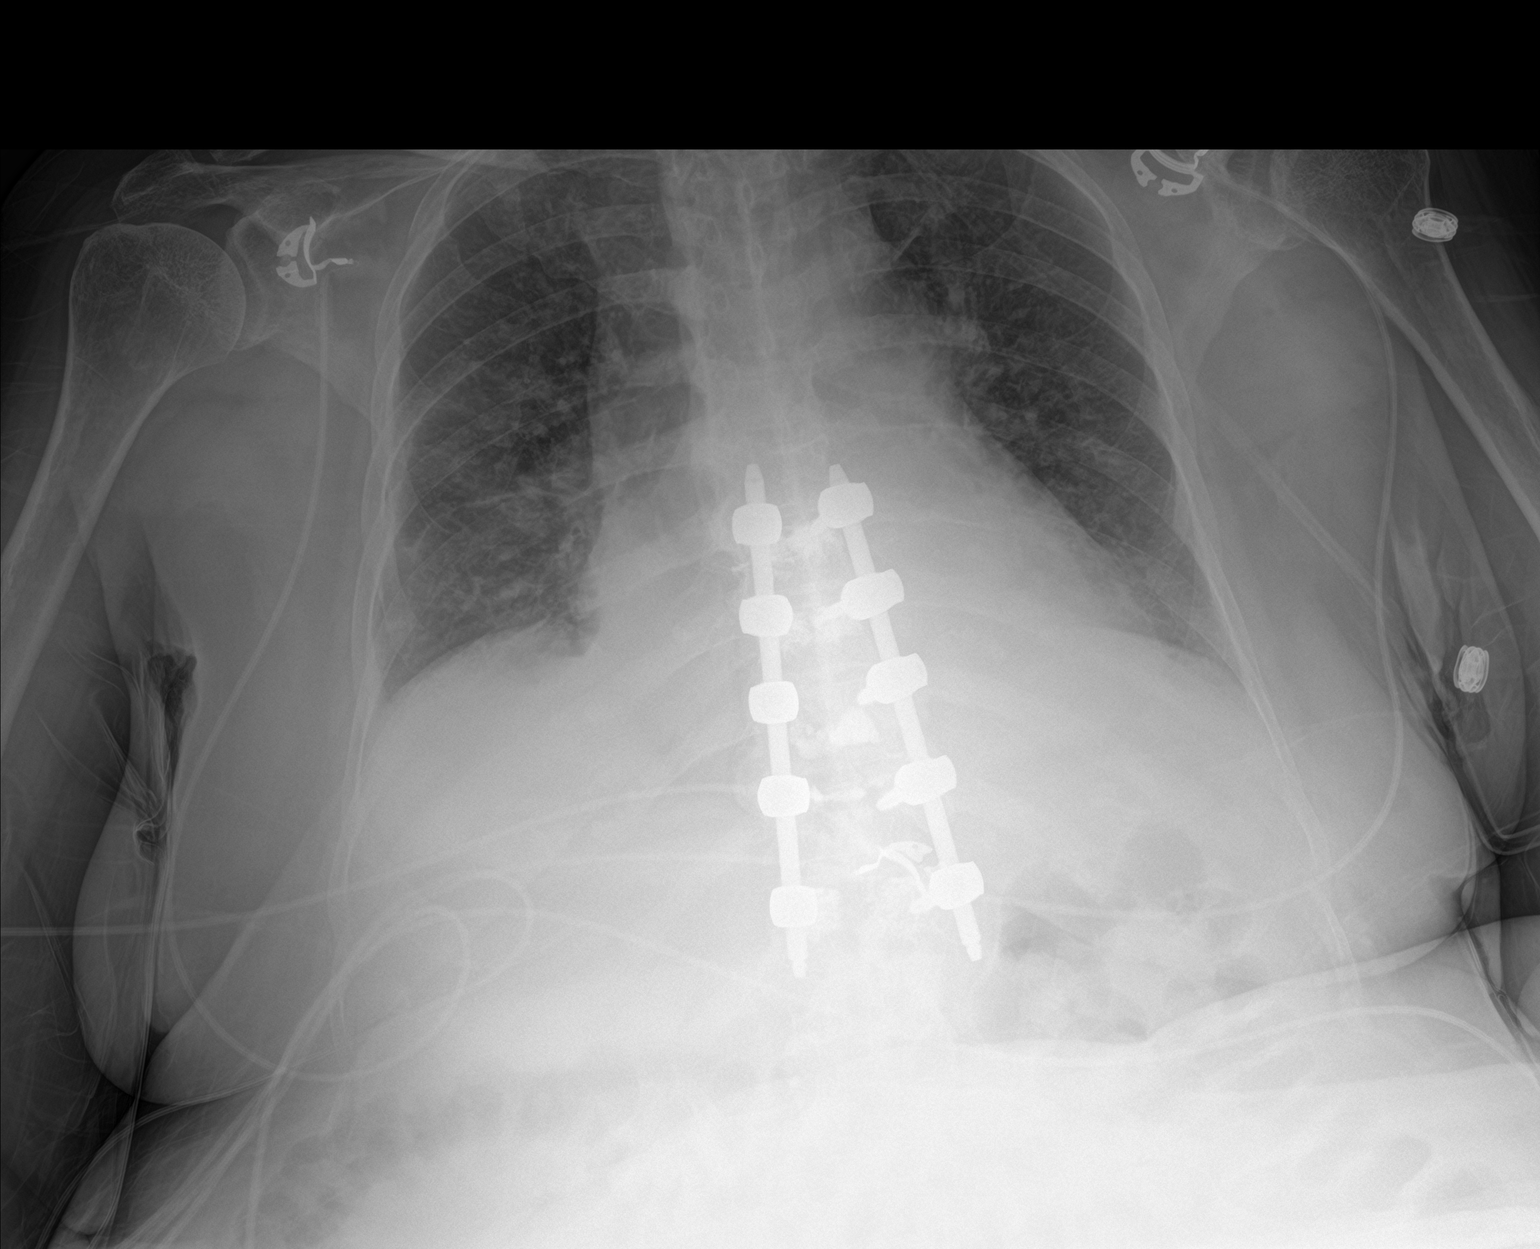

[2 of 2 positions shown; findings below may reference images not displayed]

FINDINGS: AP and lateral views of the chest. Continued evidence of bilateral
pleural effusions on the lateral view, small. Mild generalized
increased interstitial opacity and mild streaky opacity at the lung
bases and along the fissures. No pneumothorax. Stable mild
cardiomegaly. Visualized tracheal air column is within normal
limits.

Multilevel lower thoracic cemented pedicle screws and connecting
rods again noted. Osteopenia. No acute osseous abnormality
identified. Negative visible bowel gas pattern.
IMPRESSION: 1. Small bilateral pleural effusions with atelectasis.
2. Mild generalized increased interstitial opacity which could be
interstitial edema or viral/atypical infection.

## 2020-05-23 MED ORDER — ALBUTEROL SULFATE (2.5 MG/3ML) 0.083% IN NEBU: 2.5000 mg | INHALATION_SOLUTION | Freq: Four times a day (QID) | RESPIRATORY_TRACT | Status: DC | PRN

## 2020-05-23 NOTE — Plan of Care (Signed)

## 2020-05-23 NOTE — Evaluation (Signed)
Physical Therapy Evaluation Patient Details Name: Crystal Ashley MRN: [id number] DOB: [birth year] Today's Date: 05/23/2020   History of Present Illness  74 yo female admitted to [facility] on 10/6 from [place] with PNA. PMH includes afib, uterine cancer, hypothyroidism, fall with pelvic fracture and T10/11 fx s/p fusion 01/2020.  Clinical Impression   Pt presents with generalized weakness, impaired sitting and standing balance, difficulty performing all mobility tasks, and decreased activity tolerance vs baseline. Pt to benefit from acute PT to address deficits. Pt required mod-max assist +2 for bed mobility and transfer to recliner at bedside, pt with heavy posterior leaning and significant unsteadiness in standing. PT recommending return to SNF level of care post-acutely, family is supportive of this plan. PT to progress mobility as tolerated, and will continue to follow acutely.    SpO2 100% on 4LO2 via [place]    Follow Up Recommendations SNF;Supervision/Assistance - 24 hour    Equipment Recommendations  None recommended by PT    Recommendations for Other Services       Precautions / Restrictions Precautions Precautions: Fall Restrictions Weight Bearing Restrictions: No      Mobility  Bed Mobility Overal bed mobility: Needs Assistance Bed Mobility: Rolling Rolling: Mod assist;+2 for physical assistance         General bed mobility comments: Mod-max +2 for rolling bilaterally, improved pt involvement when given cues to reach for bedrails. Total care for pericare, loose stool.  Transfers Overall transfer level: Needs assistance Equipment used: Rolling walker (2 wheeled);2 person hand held assist Transfers: Sit to/from [facility] Sit to Stand: Max assist;+2 physical assistance;From elevated surface Stand pivot transfers: Max assist;+2 physical assistance;From elevated surface       General transfer comment: max +2 for power up, hip extension, steadying, and  right posture as pt with posterior leaning upon standing. Tactile cuing at pelvis to lean forward. Pt unable to take steps with use of RW, transitioned to HHA +2 for transfer to recliner with max +2 for LE blocking, hip translation, steadying, and slow eccentric lower into recliner.  Ambulation/Gait             General Gait Details: unable  Stairs            Wheelchair Mobility    Modified Rankin (Stroke Patients Only)       Balance Overall balance assessment: Needs assistance Sitting-balance support: Feet supported Sitting balance-Leahy Scale: Poor Sitting balance - Comments: periods of supervision level of assist sitting EOB, posterior leaning with fatigue requiring min assist to correct   Standing balance support: During functional activity Standing balance-Leahy Scale: Zero Standing balance comment: max +2 for stand and transfer                             Pertinent Vitals/Pain Pain Assessment: Faces Faces Pain Scale: Hurts little more Pain Location: LEs and buttocks, during pericare Pain Descriptors / Indicators: Grimacing;Moaning Pain Intervention(s): Limited activity within patient's tolerance;Monitored during session;Repositioned    Home Living Family/patient expects to be discharged to:: Skilled nursing facility                 Additional Comments: to [facility] for additional rehab and then home from there    Prior Function Level of Independence: Needs assistance   Gait / Transfers Assistance Needed: Per pt's son, pt ambulatory with RW for short distances (5-10 steps)  ADL's / Homemaking Assistance Needed: Pt reports assist for dressing, bathing,  pericare.        Hand Dominance   Dominant Hand: Right    Extremity/Trunk Assessment   Upper Extremity Assessment Upper Extremity Assessment: Defer to OT evaluation    Lower Extremity Assessment Lower Extremity Assessment: Generalized weakness (able to perform ankle pumps, limited  ROM heel slide, quad set bilaterally)       Communication   Communication: Other (comment);Prefers language other than English (speaks some english, first language is Hindi (son in room requesting to interpret as needed))  Cognition Arousal/Alertness: Awake/alert Behavior During Therapy: WFL for tasks assessed/performed Overall Cognitive Status: Impaired/Different from baseline Area of Impairment: Following commands;Problem solving                       Following Commands: Follows one step commands with increased time     Problem Solving: Slow processing;Difficulty sequencing;Requires tactile cues;Requires verbal cues;Decreased initiation General Comments: Unsure of pt baseline, follows commands well with increased time and at times repeated multimodal cuing. fear of falling when sitting EOB.      General Comments General comments (skin integrity, edema, etc.): BP 114/62, HR 117 bpm sitting EOB    Exercises     Assessment/Plan    PT Assessment Patient needs continued PT services  PT Problem List Decreased strength;Decreased mobility;Decreased activity tolerance;Decreased balance;Decreased knowledge of use of DME;Pain;Decreased range of motion;Decreased safety awareness;Obesity       PT Treatment Interventions DME instruction;Therapeutic activities;Gait training;Therapeutic exercise;Patient/family education;Balance training;Stair training;Functional mobility training;Neuromuscular re-education    PT Goals (Current goals can be found in the Care Plan section)  Acute Rehab PT Goals Patient Stated Goal: back to [place], then home PT Goal Formulation: With patient Time For Goal Achievement: 06/06/20 Potential to Achieve Goals: Good    Frequency Min 2X/week   Barriers to discharge        Co-evaluation   Reason for Co-Treatment: Complexity of the patient's impairments (multi-system involvement);To address functional/ADL transfers           AM-PAC PT "6 Clicks"  Mobility  Outcome Measure Help needed turning from your back to your side while in a flat bed without using bedrails?: A Lot Help needed moving from lying on your back to sitting on the side of a flat bed without using bedrails?: Total Help needed moving to and from a bed to a chair (including a wheelchair)?: Total Help needed standing up from a chair using your arms (e.g., wheelchair or bedside chair)?: Total Help needed to walk in hospital room?: Total Help needed climbing 3-5 steps with a railing? : Total 6 Click Score: 7    End of Session Equipment Utilized During Treatment: Gait belt Activity Tolerance: Patient limited by fatigue Patient left: in chair;with call bell/phone within reach;with chair alarm set;with family/visitor present Nurse Communication: Mobility status;Need for lift equipment (bari stedy for back to bed) PT Visit Diagnosis: Other abnormalities of gait and mobility (R26.89);Unsteadiness on feet (R26.81)    Time: 1010-1037 PT Time Calculation (min) (ACUTE ONLY): 27 min   Charges:   PT Evaluation $PT Eval Low Complexity: 1 Low          Brennen Gardiner E, PT Acute Rehabilitation Services Pager [phone]  Office [phone]    Jones Viviani D Jonel Sick 05/23/2020, 11:04 AM

## 2020-05-23 NOTE — Progress Notes (Signed)
PROGRESS NOTE    Crystal Ashley  MWN:[id number] DOB: [birth year] DOA: 05/22/2020 PCP: [name], PA  Brief Narrative:  74 year old female [place] resdinet Atrial fibrillation CHADS2 score >4 on Xarelto, DM TY 2 Stage I uterine cancer status post hysterectomy hypothyroidism Prior admission 10/2018 for lobar pneumonia Anemia of chronic disease Recent history of fall 01/31/2020 with ongoing right hip pain with pelvic fractures T10/T11 fracture status post surgical decompression with fusion  Mild cognitive dysfunction  Presented from [facility] 10/6 difficulty breathing pulse ox read to be 74-emergency work-up revealed probable pneumonia on CT scan  Presents to [facility] 10/6-ancillary history as per son [name] [phone].    Assessment & Plan:   Active Problems:   PNA (pneumonia)   1. Healthcare associated pneumonia versus aspiration a. Will continue IV Abx-Sputum cult ordered and pending b. Narrow once approp although CXR seems to show to my read fluid?PNA c. White count is down d. Needs desat screen to be done ad per PT likely will need SNF 2. Atrial fibrillation CHADS2 score >4 on Xarelto  a. Rate controlled now -continuing Cardizem 180 Cd and Toprol XL 100 mg daily 3. DM TY 2 a. Mod control-cont Metformin 100 bid--sugar ranges 111-178 4. Prior stage I uterine cancer status post hysterectomy 5. Anemia chronic disease 6. Recent fall 01/31/2020 with hip pain status post fusion a. Will need SNF stay again on d/c will elicit TOC help 7. Mild cognitive dysfunction  DVT prophylaxis: Xarelto Code Status: DNR confirmed Family Communication: Discussed with son on phone Disposition: Inpatient  Status is: Inpatient  Remains inpatient appropriate because:Hemodynamically unstable, Ongoing diagnostic testing needed not appropriate for outpatient work up, Unsafe d/c plan and IV treatments appropriate due to intensity of illness or inability to take PO   Dispo: The patient  is from: SNF              Anticipated d/c is to: SNF              Anticipated d/c date is: 2 days              Patient currently is not medically stable to d/c.       Consultants:   none  Procedures: no  Antimicrobials: ceftriaxone Azithro    Subjective: Awake alert  "I feel better" Thinks want sto get back to bed No cp no fever Some sputum  Objective: Vitals:   05/22/20 2302 05/22/20 2311 05/23/20 0133 05/23/20 0201  BP:  116/65    Pulse:  94    Resp:  18  18  Temp:  99.2 F (37.3 C)    TempSrc:  Axillary    SpO2:  100%  100%  Weight: 88.9 kg  88.9 kg   Height:        Intake/Output Summary (Last 24 hours) at 05/23/2020 0740 Last data filed at 05/23/2020 0600 Gross per 24 hour  Intake 966.5 ml  Output 200 ml  Net 766.5 ml   Filed Weights   05/22/20 1006 05/22/20 2302 05/23/20 0133  Weight: 85.3 kg 88.9 kg 88.9 kg    Examination:  General exam: obese pleasant in nad no focal deficit Respiratory system: cta b no added sound Cardiovascular system: s1 s 2no m/r/g Gastrointestinal system: abd soft nt nd no rebound no gaurd. Central nervous system: intact no focal defici Extremities: no le edema Skin:  intact Psychiatry: euthymic congruent  Data Reviewed: I have personally reviewed following labs and imaging studies   Radiology Studies: CT  Angio Chest PE W/Cm &/Or Wo Cm  Result Date: 05/22/2020 CLINICAL DATA:  Chest pain or SOB, pleurisy or effusion suspected EXAM: CT ANGIOGRAPHY CHEST WITH CONTRAST TECHNIQUE: Multidetector CT imaging of the chest was performed using the standard protocol during bolus administration of intravenous contrast. Multiplanar CT image reconstructions and MIPs were obtained to evaluate the vascular anatomy. CONTRAST:  42mL OMNIPAQUE IOHEXOL 350 MG/ML SOLN COMPARISON:  Radiograph earlier this day. FINDINGS: Cardiovascular: There are no filling defects within the pulmonary arteries to suggest pulmonary embolus. Subsegmental branches  are not well assessed. Upper normal heart size. Contrast refluxes minimally into the hepatic veins. Aortic atherosclerosis without dissection or acute aortic findings. Conventional branching pattern from the aortic arch. Occasional coronary artery calcifications. No pericardial effusion. Mediastinum/Nodes: Scattered small mediastinal lymph nodes, largest in the prevascular station measuring 11 mm. Shotty bilateral hilar nodes. No axillary adenopathy. No visualized thyroid nodule. No esophageal wall thickening. Lungs/Pleura: Moderate bilateral pleural effusions with adjacent compressive atelectasis. Partial atelectasis involving the perifissural right middle lobe, there is no evidence of central obstructing lesion. Mild patchy and ground-glass opacities in the anterior right upper lobe, series 7, image 37, and occasionally in the left upper lobe, series 7, image 24. Upper Abdomen: Minimal contrast refluxing into the hepatic veins. No other acute findings. Musculoskeletal: Posterior fusion in the lower thoracic spine with intact hardware. There are no acute or suspicious osseous abnormalities. Review of the MIP images confirms the above findings. IMPRESSION: 1. No pulmonary embolus. 2. Moderate bilateral pleural effusions with adjacent compressive atelectasis. Partial atelectasis involving the perifissural right middle lobe, no evidence of central obstructing lesion. 3. Mild patchy and ground-glass opacities in the anterior right upper lobe and occasionally in the left upper lobe, likely infectious bronchiolitis or other inflammatory process. Findings are not typical of pulmonary edema. 4. Borderline cardiomegaly. Contrast refluxing into the hepatic veins suggesting elevated right heart pressures. 5. Mildly enlarged prevascular node is likely reactive. Aortic Atherosclerosis (ICD10-I70.0). Electronically Signed   By: Keith Rake M.D.   On: 05/22/2020 17:10   DG Chest Port 1 View  Result Date:  05/22/2020 CLINICAL DATA:  Shortness of breath. EXAM: PORTABLE CHEST 1 VIEW COMPARISON:  February 28, 2020. FINDINGS: Stable cardiomediastinal silhouette. No pneumothorax is noted. Left lung is clear. Mild right infrahilar opacity is noted which may represent atelectasis or infiltrate. Surgical posterior fusion of lower thoracic spine is noted. IMPRESSION: Mild right infrahilar opacity is noted which may represent atelectasis or infiltrate. Electronically Signed   By: Marijo Conception M.D.   On: 05/22/2020 11:02     Scheduled Meds: . albuterol  2.5 mg Nebulization Q6H  . azithromycin  500 mg Oral Q supper  . diltiazem  180 mg Oral Daily  . dorzolamide-timolol  1 drop Both Eyes BID  . insulin glargine  10 Units Subcutaneous QHS  . levothyroxine  75 mcg Oral Q0600  . lidocaine  1 patch Transdermal Daily  . metFORMIN  1,000 mg Oral Q breakfast  . metoprolol succinate  100 mg Oral Daily  . pantoprazole  40 mg Oral Daily  . potassium chloride  40 mEq Oral Daily  . Rivaroxaban  15 mg Oral Q supper  . sennosides  10 mL Oral BID  . Travoprost (BAK Free)  1 drop Both Eyes QHS   Continuous Infusions: . sodium chloride 50 mL/hr at 05/22/20 1921  . cefTRIAXone (ROCEPHIN)  IV       LOS: 1 day    Time spent: 87  Nita Sells, MD [facility] To contact the attending provider between 7A-7P or the covering provider during after hours 7P-7A, please log into the web site [email or web address] and access using universal [place] password for that web site. If you do not have the password, please call the hospital operator.  05/23/2020, 7:40 AM

## 2020-05-23 NOTE — Progress Notes (Signed)
Occupational Therapy Evaluation Patient Details Name: Crystal Ashley MRN: [id number] DOB: 1945/12/22 Today's Date: 05/23/2020    History of Present Illness 74 yo female admitted to [facility] on 10/6 from [place] with PNA. PMH includes afib, uterine cancer, hypothyroidism, fall with pelvic fracture and T10/11 fx s/p fusion 01/2020.   Clinical Impression   PTA pt receiving rehab services at [facility]. Pt admitted for the reasons listed above and is limited by generalized weakness, decreased sitting/standing balance, decreased independence with ADLs vs baseline, and decreased activity tolerance. Pt will benefit from skilled OT to address limitations. Pt required mod-max assist +2 for bed mobility, max assist and HHA +2 for transfer to recliner at bedside, total assist for peri care, and set up with supervision for grooming tasks with min verbal cueing due to slow processing and decreased initiation with pt seated in recliner. Recommend return to SNF, family supportive of plan. Will continue to follow.    Follow Up Recommendations  SNF    Equipment Recommendations  None recommended by OT    Recommendations for Other Services       Precautions / Restrictions Precautions Precautions: Fall Restrictions Weight Bearing Restrictions: No      Mobility Bed Mobility Overal bed mobility: Needs Assistance Bed Mobility: Rolling Rolling: Mod assist;+2 for physical assistance         General bed mobility comments: Mod-max +2 for rolling bilaterally, improved pt involvement when given cues to reach for bedrails. Total care for pericare, loose stool.  Transfers Overall transfer level: Needs assistance Equipment used: Rolling walker (2 wheeled);2 person hand held assist Transfers: Sit to/from [facility] Sit to Stand: Max assist;+2 physical assistance;From elevated surface Stand pivot transfers: Max assist;+2 physical assistance;From elevated surface       General transfer comment:  max +2 for power up, hip extension, steadying, and right posture as pt with posterior leaning upon standing. Tactile cuing at pelvis to lean forward. Pt unable to take steps with use of RW, transitioned to HHA +2 for transfer to recliner with max +2 for LE blocking, hip translation, steadying, and slow eccentric lower into recliner.    Balance Overall balance assessment: Needs assistance Sitting-balance support: Feet supported Sitting balance-Leahy Scale: Poor Sitting balance - Comments: periods of supervision level of assist sitting EOB, posterior leaning with fatigue requiring min assist to correct   Standing balance support: During functional activity Standing balance-Leahy Scale: Zero Standing balance comment: max +2 for stand and transfer                           ADL either performed or assessed with clinical judgement   ADL Overall ADL's : Needs assistance/impaired     Grooming: Wash/dry face;Set up;Sitting;Supervision/safety;Cueing for sequencing Grooming Details (indicate cue type and reason): min verbal cueing needed due to slow processing and decreased initiation             Lower Body Dressing: Total assistance;Bed level Lower Body Dressing Details (indicate cue type and reason): required total assist to don socks. pt's son reports staff at [facility] dresses and bathes her 2-3 times a week Toilet Transfer: +2 for physical assistance;Maximal assistance;Stand-pivot Toilet Transfer Details (indicate cue type and reason): simulated in room to chair. see transfers below for more details Toileting- Clothing Manipulation and Hygiene: Total assistance;Bed level;+2 for physical assistance Toileting - Clothing Manipulation Details (indicate cue type and reason): mod assist +2 for physical assistance for rolling in bed. pt had BM  in bed and total assist for peri care     Functional mobility during ADLs: Maximal assistance;+2 for physical assistance;Rolling walker (unable to  take steps with RW max assist +2 ) General ADL Comments: Pt requires max to total assist for most ADLs at this time. Mod to max assist +2 for rolling in bed. Total assist for peri care. Max assist +2 for transfers. can likely complete most grooming tasks when seated with set up to min assist      Vision         Perception     Praxis      Pertinent Vitals/Pain Pain Assessment: Faces Faces Pain Scale: Hurts little more Pain Location: LEs and buttocks, during pericare Pain Descriptors / Indicators: Grimacing;Moaning Pain Intervention(s): Limited activity within patient's tolerance;Monitored during session;Repositioned     Hand Dominance Right   Extremity/Trunk Assessment Upper Extremity Assessment Upper Extremity Assessment: Generalized weakness   Lower Extremity Assessment Lower Extremity Assessment: Defer to PT evaluation       Communication Communication Communication: Other (comment);Prefers language other than English (speaks some english, first language is Hindi (son in room requesting to interpret as needed))   Cognition Arousal/Alertness: Awake/alert Behavior During Therapy: WFL for tasks assessed/performed Overall Cognitive Status: Impaired/Different from baseline Area of Impairment: Following commands;Problem solving                       Following Commands: Follows one step commands with increased time     Problem Solving: Slow processing;Difficulty sequencing;Requires tactile cues;Requires verbal cues;Decreased initiation General Comments: Unsure of pt baseline, follows commands well with increased time and at times repeated multimodal cuing. fear of falling when sitting EOB.   General Comments  BP 114/62, HR 117 bpm sitting EOB, potenial hemorrhoids reported to nursing    Exercises     Shoulder Instructions      Home Living Family/patient expects to be discharged to:: Skilled nursing facility                                  Additional Comments: to [facility] for additional rehab and then home from there      Prior Functioning/Environment Level of Independence: Needs assistance  Gait / Transfers Assistance Needed: Per pt's son, pt ambulatory with RW for short distances (5-10 steps) ADL's / Homemaking Assistance Needed: Pt reports assist for dressing, bathing, pericare.            OT Problem List: Decreased strength;Decreased activity tolerance;Impaired balance (sitting and/or standing);Decreased safety awareness;Decreased knowledge of use of DME or AE;Decreased knowledge of precautions      OT Treatment/Interventions: Self-care/ADL training;Therapeutic exercise;DME and/or AE instruction;Therapeutic activities;Patient/family education    OT Goals(Current goals can be found in the care plan section) Acute Rehab OT Goals Patient Stated Goal: back to [place], then home OT Goal Formulation: With patient/family Time For Goal Achievement: 06/06/20 Potential to Achieve Goals: Fair ADL Goals Pt Will Perform Upper Body Bathing: with min assist;sitting Pt Will Transfer to Toilet: with max assist;bedside commode Additional ADL Goal #1: Pt will demonstrate ability to complete bed mobility with min assist and verbal cueing as needed in preparation for participation in BADLs. Additional ADL Goal #2: Pt will demonstrate ability to follow multistep commands 75% of the time for carryover into completing ADL routine.  OT Frequency: Min 2X/week   Barriers to D/C:  Co-evaluation PT/OT/SLP Co-Evaluation/Treatment: Yes Reason for Co-Treatment: Complexity of the patient's impairments (multi-system involvement);To address functional/ADL transfers          AM-PAC OT "6 Clicks" Daily Activity     Outcome Measure Help from another person eating meals?: A Little Help from another person taking care of personal grooming?: A Lot Help from another person toileting, which includes using toliet, bedpan, or urinal?:  Total Help from another person bathing (including washing, rinsing, drying)?: Total Help from another person to put on and taking off regular upper body clothing?: A Lot Help from another person to put on and taking off regular lower body clothing?: Total 6 Click Score: 10   End of Session Equipment Utilized During Treatment: Gait belt;Rolling walker;Oxygen Nurse Communication: Mobility status;Other (comment) (report potential hemorrhoids)  Activity Tolerance: Patient tolerated treatment well;Patient limited by fatigue Patient left: in chair;with call bell/phone within reach;with family/visitor present  OT Visit Diagnosis: Muscle weakness (generalized) (M62.81);Unsteadiness on feet (R26.81);Other abnormalities of gait and mobility (R26.89);History of falling (Z91.81)                Time: 1010-1037 OT Time Calculation (min): 27 min Charges:  OT General Charges $OT Visit: 1 Visit OT Evaluation $OT Eval Moderate Complexity: [place], MOTR/L Acute Rehabilitation Services - Relief OT Office: [place] 05/23/2020, 12:45 PM

## 2020-05-24 ENCOUNTER — Inpatient Hospital Stay (HOSPITAL_COMMUNITY): Payer: Medicare Other

## 2020-05-24 LAB — GLUCOSE, CAPILLARY: Glucose-Capillary: 94 mg/dL (ref 70–99)

## 2020-05-24 LAB — SARS CORONAVIRUS 2 BY RT PCR (HOSPITAL ORDER, PERFORMED IN ~~LOC~~ HOSPITAL LAB): SARS Coronavirus 2: NEGATIVE

## 2020-05-24 MED ORDER — CEFDINIR 300 MG PO CAPS
300.0000 mg | ORAL_CAPSULE | Freq: Two times a day (BID) | ORAL | Status: DC
  Administered 2020-05-24 – 2020-05-25 (×3): 300 mg via ORAL
  Filled 2020-05-24 (×4): qty 1

## 2020-05-24 MED ORDER — RIVAROXABAN 20 MG PO TABS
20.0000 mg | ORAL_TABLET | Freq: Every day | ORAL | Status: DC
  Administered 2020-05-24: 20 mg via ORAL
  Filled 2020-05-24: qty 1

## 2020-05-24 NOTE — Progress Notes (Signed)
PROGRESS NOTE    Crystal Ashley  DXA:[id number] DOB: 09-Dec-1945 DOA: 05/22/2020 PCP: Crystal Coke, PA  Brief Narrative:  74 year old female [place] resident Atrial fibrillation CHADS2 score >4 on Xarelto, DM TY 2 Stage I uterine cancer status post hysterectomy hypothyroidism Prior admission 10/2018 for lobar pneumonia Anemia of chronic disease Recent history of fall 01/31/2020 with ongoing right hip pain with pelvic fractures T10/T11 fracture status post surgical decompression with fusion  Mild cognitive dysfunction  Presented from [facility] 10/6 difficulty breathing pulse ox read to be 74-emergency work-up revealed probable pneumonia on CT scan  Presents to [facility] 10/6-ancillary history as per son Crystal Ashley [phone].   Assessment & Plan:   Active Problems:   PNA (pneumonia)   1. Healthcare associated pneumonia versus aspiration a. Follow-up sputum cult, blood culture b. Have asked for speech therapy input secondary to patient having difficulty tolerating p.o. pills and water and may need modification of the same c. Transitioned to Ola 300 twice daily end date 10/12 d. Needs desat screen to be done ad per PT likely will need SNF 2. Atrial fibrillation CHADS2 score >4 on Xarelto  a. Rate controlled now -continuing Cardizem 180 Cd and Toprol XL 100 mg daily b. Adjust as needed-slightly tachycardic this morning therefore may need to increase dose of Cardizem on discharge 3. DM TY 2 a. Mod control-cont Metformin 100 bid--sugar ranges 94-1 78 4. Prior stage I uterine cancer status post hysterectomy 5. Anemia chronic disease 6. Recent fall 01/31/2020 with hip pain status post fusion a. Will need SNF stay again on d/c will elicit TOC help 7. Mild cognitive dysfunction  DVT prophylaxis: Xarelto Code Status: DNR confirmed Family Communication: Discussed with son on phone Disposition: Inpatient  Status is: Inpatient  Remains inpatient appropriate  because:Hemodynamically unstable, Ongoing diagnostic testing needed not appropriate for outpatient work up, Unsafe d/c plan and IV treatments appropriate due to intensity of illness or inability to take PO   Dispo: The patient is from: SNF              Anticipated d/c is to: SNF              Anticipated d/c date is: 1 day              Patient currently is not medically stable to d/c.       Consultants:   none  Procedures: no  Antimicrobials: ceftriaxone Azithro    Subjective: Nursing reports was having difficulty with water and pills earlier today and was coughing No chest pain Eating okay otherwise   Objective: Vitals:   05/23/20 1036 05/23/20 1625 05/23/20 2330 05/24/20 0825  BP: 114/62 (!) 114/54 100/66 119/62  Pulse:  84 80 69  Resp:   15 20  Temp:  98.3 F (36.8 C) 97.8 F (36.6 C) 97.8 F (36.6 C)  TempSrc:  Oral    SpO2:  100% 100% 91%  Weight:      Height:        Intake/Output Summary (Last 24 hours) at 05/24/2020 0959 Last data filed at 05/24/2020 0529 Gross per 24 hour  Intake 262.36 ml  Output 500 ml  Net -237.64 ml   Filed Weights   05/22/20 1006 05/22/20 2302 05/23/20 0133  Weight: 85.3 kg 88.9 kg 88.9 kg    Examination:  General exam: Pleasant coherent no distress Respiratory system: cta b no added sound no rales no rhonchi Cardiovascular system: S1-S2 tachycardic slightly no murmur [name] Gastrointestinal  system: abd soft nt nd no rebound no gaurd. Central nervous system: intact no focal defici Extremities: no le edema no swelling or rash Skin:  intact Psychiatry: euthymic congruent  Data Reviewed: I have personally reviewed following labs and imaging studies  No labs performed today  Radiology Studies: DG Chest 2 View  Result Date: 05/23/2020 CLINICAL DATA:  74 year old female with pneumonia, weakness. EXAM: CHEST - 2 VIEW COMPARISON:  Chest CTA 05/22/2020 and earlier. FINDINGS: AP and lateral views of the chest.  Continued evidence of bilateral pleural effusions on the lateral view, small. Mild generalized increased interstitial opacity and mild streaky opacity at the lung bases and along the fissures. No pneumothorax. Stable mild cardiomegaly. Visualized tracheal air column is within normal limits. Multilevel lower thoracic cemented pedicle screws and connecting rods again noted. Osteopenia. No acute osseous abnormality identified. Negative visible bowel gas pattern. IMPRESSION: 1. Small bilateral pleural effusions with atelectasis. 2. Mild generalized increased interstitial opacity which could be interstitial edema or viral/atypical infection. Electronically Signed   By: Genevie Ann M.D.   On: 05/23/2020 08:12   CT Angio Chest PE W/Cm &/Or Wo Cm  Result Date: 05/22/2020 CLINICAL DATA:  Chest pain or SOB, pleurisy or effusion suspected EXAM: CT ANGIOGRAPHY CHEST WITH CONTRAST TECHNIQUE: Multidetector CT imaging of the chest was performed using the standard protocol during bolus administration of intravenous contrast. Multiplanar CT image reconstructions and MIPs were obtained to evaluate the vascular anatomy. CONTRAST:  33mL OMNIPAQUE IOHEXOL 350 MG/ML SOLN COMPARISON:  Radiograph earlier this day. FINDINGS: Cardiovascular: There are no filling defects within the pulmonary arteries to suggest pulmonary embolus. Subsegmental branches are not well assessed. Upper normal heart size. Contrast refluxes minimally into the hepatic veins. Aortic atherosclerosis without dissection or acute aortic findings. Conventional branching pattern from the aortic arch. Occasional coronary artery calcifications. No pericardial effusion. Mediastinum/Nodes: Scattered small mediastinal lymph nodes, largest in the prevascular station measuring 11 mm. Shotty bilateral hilar nodes. No axillary adenopathy. No visualized thyroid nodule. No esophageal wall thickening. Lungs/Pleura: Moderate bilateral pleural effusions with adjacent compressive  atelectasis. Partial atelectasis involving the perifissural right middle lobe, there is no evidence of central obstructing lesion. Mild patchy and ground-glass opacities in the anterior right upper lobe, series 7, image 37, and occasionally in the left upper lobe, series 7, image 24. Upper Abdomen: Minimal contrast refluxing into the hepatic veins. No other acute findings. Musculoskeletal: Posterior fusion in the lower thoracic spine with intact hardware. There are no acute or suspicious osseous abnormalities. Review of the MIP images confirms the above findings. IMPRESSION: 1. No pulmonary embolus. 2. Moderate bilateral pleural effusions with adjacent compressive atelectasis. Partial atelectasis involving the perifissural right middle lobe, no evidence of central obstructing lesion. 3. Mild patchy and ground-glass opacities in the anterior right upper lobe and occasionally in the left upper lobe, likely infectious bronchiolitis or other inflammatory process. Findings are not typical of pulmonary edema. 4. Borderline cardiomegaly. Contrast refluxing into the hepatic veins suggesting elevated right heart pressures. 5. Mildly enlarged prevascular node is likely reactive. Aortic Atherosclerosis (ICD10-I70.0). Electronically Signed   By: Keith Rake M.D.   On: 05/22/2020 17:10   DG Chest Port 1 View  Result Date: 05/22/2020 CLINICAL DATA:  Shortness of breath. EXAM: PORTABLE CHEST 1 VIEW COMPARISON:  February 28, 2020. FINDINGS: Stable cardiomediastinal silhouette. No pneumothorax is noted. Left lung is clear. Mild right infrahilar opacity is noted which may represent atelectasis or infiltrate. Surgical posterior fusion of lower thoracic spine is noted. IMPRESSION:  Mild right infrahilar opacity is noted which may represent atelectasis or infiltrate. Electronically Signed   By: Marijo Conception M.D.   On: 05/22/2020 11:02     Scheduled Meds: . cefdinir  300 mg Oral Q12H  . diltiazem  180 mg Oral Daily  .  dorzolamide-timolol  1 drop Both Eyes BID  . insulin glargine  10 Units Subcutaneous QHS  . levothyroxine  75 mcg Oral Q0600  . lidocaine  1 patch Transdermal Daily  . metFORMIN  1,000 mg Oral Q breakfast  . metoprolol succinate  100 mg Oral Daily  . pantoprazole  40 mg Oral Daily  . potassium chloride  40 mEq Oral Daily  . Rivaroxaban  20 mg Oral Q supper  . sennosides  10 mL Oral BID  . Travoprost (BAK Free)  1 drop Both Eyes QHS   Continuous Infusions:    LOS: 2 days    Time spent: [place], MD [facility] To contact the attending provider between 7A-7P or the covering provider during after hours 7P-7A, please log into the web site [email or web address] and access using universal [place] password for that web site. If you do not have the password, please call the hospital operator.  05/24/2020, 9:59 AM

## 2020-05-24 NOTE — Progress Notes (Signed)
Modified Barium Swallow Progress Note  Patient Details  Name: Crystal Ashley MRN: [id number] Date of Birth: 09-May-1946  Today's Date: 05/24/2020  Modified Barium Swallow completed.  Full report located under Chart Review in the Imaging Section.  Brief recommendations include the following:  Clinical Impression   A mild oropharyngeal dysphagia was exhibited with suspected component of esophageal dysphagia. Oral deficits included reduced bolus cohesion of solid PO, prolonged mastication and mild oral residuals across POs; second swallows assisted clearance. Pt with difficulty propeling barium tablet from oral cavity to hypoharynx requiring multiple liquid boluses to assist.  Pharyngeal deficits included isolated instance of reduced timing and efficiency of laryngeal vestibule closure during barium tablet series only allowing for pre and during the swallow penetration of thin liquids. Penetrates spontaneously cleared post swallow. Pt appeared to have bolus retrograde from esophagus to pharynx during barium tablet series with associated coughing, however her larger body habitus limited full viewing of eosphagus and trachea. No aspiration evidenced. Mild vallecular residuals were exhibited with POs secondary to reduced base of tongue retraction, secondary swallows reduced residuals. Upon esophageal sweep 13 mm tablet noted to pass into stomach; distal barium bolus retention was exhibited. Pt may benefit from esophageal workup per MD discretion.   Swallow Evaluation Recommendations   Recommended Consults: Consider esophageal assessment   SLP Diet Recommendations: Thin liquid;Regular solids   Liquid Administration via: Cup   Medication Administration: Crushed with puree   Supervision: Full supervision/cueing for compensatory strategies;Staff to assist with self feeding   Compensations: Slow rate;Small sips/bites;Minimize environmental distractions   Postural Changes: Seated upright at 90  degrees;Remain semi-upright after after feeds/meals (Comment)   Oral Care Recommendations: Oral care BID        Wilford [place]ryfield E Cherilyn Sautter MA, CCC-SLP 05/24/2020,1:38 PM

## 2020-05-24 NOTE — Plan of Care (Signed)

## 2020-05-24 NOTE — Evaluation (Signed)
Clinical/Bedside Swallow Evaluation Patient Details  Name: Crystal Ashley MRN: [id number] Date of Birth: 11-04-1945  Today's Date: 05/24/2020 Time: SLP Start Time (ACUTE ONLY): 79 SLP Stop Time (ACUTE ONLY): 1100 SLP Time Calculation (min) (ACUTE ONLY): 17 min  Past Medical History:  Past Medical History:  Diagnosis Date  . Atrial fibrillation ([place])   . Cancer ([place])   . Cataract   . Fracture 2001   left ankle  . Glaucoma   . Hypertension   . Thyroid disease    Past Surgical History:  Past Surgical History:  Procedure Laterality Date  . APPLICATION OF ROBOTIC ASSISTANCE FOR SPINAL PROCEDURE N/A 02/26/2020   Procedure: APPLICATION OF ROBOTIC ASSISTANCE FOR SPINAL PROCEDURE;  Surgeon: [name], MD;  Location: [place];  Service: Neurosurgery;  Laterality: N/A;  . CARDIOVERSION N/A 04/08/2018   Procedure: CARDIOVERSION;  Surgeon: Adrian Prows, MD;  Location: [facility] ENDOSCOPY;  Service: Cardiovascular;  Laterality: N/A;  . CARDIOVERSION N/A 05/22/2019   Procedure: CARDIOVERSION;  Surgeon: Adrian Prows, MD;  Location: [place];  Service: Cardiovascular;  Laterality: N/A;  . FOOT SURGERY Left   . HYSTEROSCOPY     POLYPECTOMY  . LUMBAR PERCUTANEOUS PEDICLE SCREW 4 LEVEL N/A 02/26/2020   Procedure: Thoracic eight to thoracic twelve posterior percutaneous instrumentation with cement augmentation and bilateral medial facetectomy for decompression at Thoracic ten-eleven;  Surgeon: [name], MD;  Location: [place];  Service: Neurosurgery;  Laterality: N/A;   HPI:  74 yo female admitted to [facility] on 10/6 from [place] with PNA. PMH includes afib, uterine cancer, hypothyroidism, fall with pelvic fracture and T10/11 fx s/p fusion 01/2020.    Assessment / Plan / Recommendation Clinical Impression  Pt with reported overt swallowing difficutly during staff interaction with nursing including coughing with thin liquids and during medicine administration. During clinical swallow evaluation no  overt difficutly exhibited with POs. However given active PNA and episodic difficulty with staff and per family report, will proceed with objective swallow study. Pts son states pt has had instances consistent with globus sensation with PO medicines and intermittent emesis. Will attempt AP view on MBSS as body habitus allows. Pt may need additional esophageal workup if MBSS is unremarkable. MBSS planned this date.   SLP Visit Diagnosis: Dysphagia, unspecified (R13.10)    Aspiration Risk  Mild aspiration risk;Moderate aspiration risk    Diet Recommendation   Regular thin liquids  Medication Administration: Whole meds with puree    Other  Recommendations Oral Care Recommendations: Oral care BID   Follow up Recommendations Skilled Nursing facility      Frequency and Duration Other (Comment) (pending MBSS results)          Prognosis Prognosis for Safe Diet Advancement: Good      Swallow Study   General Date of Onset: 05/24/20 HPI: 74 yo female admitted to [facility] on 10/6 from [place] with PNA. PMH includes afib, uterine cancer, hypothyroidism, fall with pelvic fracture and T10/11 fx s/p fusion 01/2020.  Type of Study: Bedside Swallow Evaluation Previous Swallow Assessment: none on file Diet Prior to this Study: Regular;Thin liquids Temperature Spikes Noted: No Respiratory Status: Room air History of Recent Intubation: No Behavior/Cognition: Alert;Cooperative Oral Cavity Assessment: Within Functional Limits Oral Care Completed by SLP: Recent completion by staff Oral Cavity - Dentition: Adequate natural dentition Vision: Functional for self-feeding Self-Feeding Abilities: Able to feed self Patient Positioning: Upright in bed Baseline Vocal Quality: Normal Volitional Swallow: Able to elicit    Oral/Motor/Sensory Function Overall Oral Motor/Sensory  Function: Within functional limits   Ice Chips Ice chips: Within functional limits   Thin Liquid Thin Liquid: Impaired Presentation:  Cup;Straw Pharyngeal  Phase Impairments: Suspected delayed Swallow;Multiple swallows    Nectar Thick Nectar Thick Liquid: Not tested   Honey Thick Honey Thick Liquid: Not tested   Puree Puree: Within functional limits   Solid     Solid: Within functional limits      Crystal Pinela E Charles Andringa MA, CCC-SLP  Acute Rehabilitation Services   05/24/2020,11:18 AM

## 2020-05-24 NOTE — Plan of Care (Signed)
  Problem: Education: Goal: Knowledge of General Education information will improve Description: Including pain rating scale, medication(s)/side effects and non-pharmacologic comfort measures 05/24/2020 0529 by Burnice Logan, LPN Outcome: Progressing 05/24/2020 0323 by Burnice Logan, LPN Outcome: Progressing   Problem: Health Behavior/Discharge Planning: Goal: Ability to manage health-related needs will improve 05/24/2020 0529 by Burnice Logan, LPN Outcome: Progressing 05/24/2020 0323 by Burnice Logan, LPN Outcome: Progressing   Problem: Clinical Measurements: Goal: Ability to maintain clinical measurements within normal limits will improve 05/24/2020 0529 by Burnice Logan, LPN Outcome: Progressing 05/24/2020 0323 by Burnice Logan, LPN Outcome: Progressing Goal: Will remain free from infection 05/24/2020 0529 by Burnice Logan, LPN Outcome: Progressing 05/24/2020 0323 by Burnice Logan, LPN Outcome: Progressing Goal: Diagnostic test results will improve 05/24/2020 0529 by Burnice Logan, LPN Outcome: Progressing 05/24/2020 0323 by Burnice Logan, LPN Outcome: Progressing Goal: Respiratory complications will improve 05/24/2020 0529 by Burnice Logan, LPN Outcome: Progressing 05/24/2020 0323 by Burnice Logan, LPN Outcome: Progressing Goal: Cardiovascular complication will be avoided 05/24/2020 0529 by Burnice Logan, LPN Outcome: Progressing 05/24/2020 0323 by Burnice Logan, LPN Outcome: Progressing   Problem: Activity: Goal: Risk for activity intolerance will decrease 05/24/2020 0529 by Burnice Logan, LPN Outcome: Progressing 05/24/2020 0323 by Burnice Logan, LPN Outcome: Progressing   Problem: Nutrition: Goal: Adequate nutrition will be maintained 05/24/2020 0529 by Burnice Logan, LPN Outcome: Progressing 05/24/2020 0323 by Burnice Logan, LPN Outcome: Progressing   Problem:  Coping: Goal: Level of anxiety will decrease 05/24/2020 0529 by Burnice Logan, LPN Outcome: Progressing 05/24/2020 0323 by Burnice Logan, LPN Outcome: Progressing   Problem: Elimination: Goal: Will not experience complications related to bowel motility 05/24/2020 0529 by Burnice Logan, LPN Outcome: Progressing 05/24/2020 0323 by Burnice Logan, LPN Outcome: Progressing Goal: Will not experience complications related to urinary retention 05/24/2020 0529 by Burnice Logan, LPN Outcome: Progressing 05/24/2020 0323 by Burnice Logan, LPN Outcome: Progressing   Problem: Pain Managment: Goal: General experience of comfort will improve 05/24/2020 0529 by Burnice Logan, LPN Outcome: Progressing 05/24/2020 0323 by Burnice Logan, LPN Outcome: Progressing   Problem: Safety: Goal: Ability to remain free from injury will improve 05/24/2020 0529 by Burnice Logan, LPN Outcome: Progressing 05/24/2020 0323 by Burnice Logan, LPN Outcome: Progressing   Problem: Skin Integrity: Goal: Risk for impaired skin integrity will decrease 05/24/2020 0529 by Burnice Logan, LPN Outcome: Progressing 05/24/2020 0323 by Burnice Logan, LPN Outcome: Progressing

## 2020-05-24 NOTE — TOC Progression Note (Signed)
Transition of Care [facility]) - Progression Note    Patient Details  Name: Crystal Ashley MRN: [id number] Date of Birth: 1946-08-02  Transition of Care [facility]) CM/SW [place], RN Phone Number: [phone]  05/24/2020, 10:22 AM  Clinical Narrative:    MD inquired if patient will be able to return to [facility] on 10/9. CM called [facility] and spoke with [place] who confirms that patient can return. All that is needed is a new covid test and discharge summary. MD has been updated.         Expected Discharge Plan and Services                          Patient will be able to return                        Social Determinants of Health (SDOH) Interventions    Readmission Risk Interventions Readmission Risk Prevention Plan 02/08/2020  Transportation Screening Complete  PCP or Specialist Appt within 5-7 Days Complete  Home Care Screening Complete  Medication Review (RN CM) Complete  Some recent data might be hidden

## 2020-05-25 DIAGNOSIS — Z8542 Personal history of malignant neoplasm of other parts of uterus: Secondary | ICD-10-CM | POA: Diagnosis not present

## 2020-05-25 DIAGNOSIS — I509 Heart failure, unspecified: Secondary | ICD-10-CM | POA: Diagnosis not present

## 2020-05-25 DIAGNOSIS — M2550 Pain in unspecified joint: Secondary | ICD-10-CM | POA: Diagnosis not present

## 2020-05-25 DIAGNOSIS — R41841 Cognitive communication deficit: Secondary | ICD-10-CM | POA: Diagnosis not present

## 2020-05-25 DIAGNOSIS — Z7401 Bed confinement status: Secondary | ICD-10-CM | POA: Diagnosis not present

## 2020-05-25 DIAGNOSIS — E1159 Type 2 diabetes mellitus with other circulatory complications: Secondary | ICD-10-CM | POA: Diagnosis not present

## 2020-05-25 DIAGNOSIS — E1165 Type 2 diabetes mellitus with hyperglycemia: Secondary | ICD-10-CM | POA: Diagnosis not present

## 2020-05-25 DIAGNOSIS — Z4789 Encounter for other orthopedic aftercare: Secondary | ICD-10-CM | POA: Diagnosis not present

## 2020-05-25 DIAGNOSIS — M4325 Fusion of spine, thoracolumbar region: Secondary | ICD-10-CM | POA: Diagnosis not present

## 2020-05-25 DIAGNOSIS — Z23 Encounter for immunization: Secondary | ICD-10-CM | POA: Diagnosis not present

## 2020-05-25 DIAGNOSIS — E039 Hypothyroidism, unspecified: Secondary | ICD-10-CM | POA: Diagnosis not present

## 2020-05-25 DIAGNOSIS — R1312 Dysphagia, oropharyngeal phase: Secondary | ICD-10-CM | POA: Diagnosis not present

## 2020-05-25 DIAGNOSIS — Z79899 Other long term (current) drug therapy: Secondary | ICD-10-CM | POA: Diagnosis not present

## 2020-05-25 DIAGNOSIS — J9811 Atelectasis: Secondary | ICD-10-CM | POA: Diagnosis not present

## 2020-05-25 DIAGNOSIS — E1169 Type 2 diabetes mellitus with other specified complication: Secondary | ICD-10-CM | POA: Diagnosis not present

## 2020-05-25 DIAGNOSIS — I110 Hypertensive heart disease with heart failure: Secondary | ICD-10-CM | POA: Diagnosis not present

## 2020-05-25 DIAGNOSIS — R0902 Hypoxemia: Secondary | ICD-10-CM | POA: Diagnosis not present

## 2020-05-25 DIAGNOSIS — Z79890 Hormone replacement therapy (postmenopausal): Secondary | ICD-10-CM | POA: Diagnosis not present

## 2020-05-25 DIAGNOSIS — R0602 Shortness of breath: Secondary | ICD-10-CM | POA: Diagnosis not present

## 2020-05-25 DIAGNOSIS — I482 Chronic atrial fibrillation, unspecified: Secondary | ICD-10-CM | POA: Diagnosis not present

## 2020-05-25 DIAGNOSIS — Y95 Nosocomial condition: Secondary | ICD-10-CM | POA: Diagnosis present

## 2020-05-25 DIAGNOSIS — J9601 Acute respiratory failure with hypoxia: Secondary | ICD-10-CM | POA: Diagnosis not present

## 2020-05-25 DIAGNOSIS — R262 Difficulty in walking, not elsewhere classified: Secondary | ICD-10-CM | POA: Diagnosis not present

## 2020-05-25 DIAGNOSIS — U071 COVID-19: Secondary | ICD-10-CM | POA: Diagnosis not present

## 2020-05-25 DIAGNOSIS — I152 Hypertension secondary to endocrine disorders: Secondary | ICD-10-CM | POA: Diagnosis not present

## 2020-05-25 DIAGNOSIS — R5381 Other malaise: Secondary | ICD-10-CM | POA: Diagnosis not present

## 2020-05-25 DIAGNOSIS — Z66 Do not resuscitate: Secondary | ICD-10-CM | POA: Diagnosis not present

## 2020-05-25 DIAGNOSIS — Z7901 Long term (current) use of anticoagulants: Secondary | ICD-10-CM | POA: Diagnosis not present

## 2020-05-25 DIAGNOSIS — R404 Transient alteration of awareness: Secondary | ICD-10-CM | POA: Diagnosis not present

## 2020-05-25 DIAGNOSIS — I4891 Unspecified atrial fibrillation: Secondary | ICD-10-CM | POA: Diagnosis not present

## 2020-05-25 DIAGNOSIS — Z6841 Body Mass Index (BMI) 40.0 and over, adult: Secondary | ICD-10-CM | POA: Diagnosis not present

## 2020-05-25 DIAGNOSIS — I5043 Acute on chronic combined systolic (congestive) and diastolic (congestive) heart failure: Secondary | ICD-10-CM | POA: Diagnosis not present

## 2020-05-25 DIAGNOSIS — E785 Hyperlipidemia, unspecified: Secondary | ICD-10-CM | POA: Diagnosis not present

## 2020-05-25 DIAGNOSIS — L89151 Pressure ulcer of sacral region, stage 1: Secondary | ICD-10-CM | POA: Diagnosis not present

## 2020-05-25 DIAGNOSIS — Z20822 Contact with and (suspected) exposure to covid-19: Secondary | ICD-10-CM | POA: Diagnosis not present

## 2020-05-25 DIAGNOSIS — M6281 Muscle weakness (generalized): Secondary | ICD-10-CM | POA: Diagnosis not present

## 2020-05-25 DIAGNOSIS — J80 Acute respiratory distress syndrome: Secondary | ICD-10-CM | POA: Diagnosis not present

## 2020-05-25 DIAGNOSIS — J189 Pneumonia, unspecified organism: Secondary | ICD-10-CM | POA: Diagnosis not present

## 2020-05-25 DIAGNOSIS — Z794 Long term (current) use of insulin: Secondary | ICD-10-CM | POA: Diagnosis not present

## 2020-05-25 DIAGNOSIS — Z833 Family history of diabetes mellitus: Secondary | ICD-10-CM | POA: Diagnosis not present

## 2020-05-25 DIAGNOSIS — R4182 Altered mental status, unspecified: Secondary | ICD-10-CM | POA: Diagnosis not present

## 2020-05-25 LAB — COMPREHENSIVE METABOLIC PANEL
ALT: 7 U/L (ref 0–44)
AST: 21 U/L (ref 15–41)
Albumin: 2.2 g/dL — ABNORMAL LOW (ref 3.5–5.0)
Alkaline Phosphatase: 70 U/L (ref 38–126)
Anion gap: 8 (ref 5–15)
BUN: 11 mg/dL (ref 8–23)
CO2: 31 mmol/L (ref 22–32)
Calcium: 8.4 mg/dL — ABNORMAL LOW (ref 8.9–10.3)
Chloride: 102 mmol/L (ref 98–111)
Creatinine, Ser: 0.94 mg/dL (ref 0.44–1.00)
GFR, Estimated: 60 mL/min — ABNORMAL LOW (ref >60)
Glucose, Bld: 135 mg/dL — ABNORMAL HIGH (ref 70–99)
Potassium: 4.3 mmol/L (ref 3.5–5.1)
Sodium: 141 mmol/L (ref 135–145)
Total Bilirubin: 0.7 mg/dL (ref 0.3–1.2)
Total Protein: 5.9 g/dL — ABNORMAL LOW (ref 6.5–8.1)

## 2020-05-25 LAB — CBC WITH DIFFERENTIAL/PLATELET
Abs Immature Granulocytes: 0.11 10*3/uL — ABNORMAL HIGH (ref 0.00–0.07)
Basophils Absolute: 0 10*3/uL (ref 0.0–0.1)
Basophils Relative: 0 %
Eosinophils Absolute: 0.5 10*3/uL (ref 0.0–0.5)
Eosinophils Relative: 5 %
HCT: 29.9 % — ABNORMAL LOW (ref 36.0–46.0)
Hemoglobin: 8.3 g/dL — ABNORMAL LOW (ref 12.0–15.0)
Immature Granulocytes: 1 %
Lymphocytes Relative: 24 %
Lymphs Abs: 2.2 10*3/uL (ref 0.7–4.0)
MCH: 26.3 pg (ref 26.0–34.0)
MCHC: 27.8 g/dL — ABNORMAL LOW (ref 30.0–36.0)
MCV: 94.6 fL (ref 80.0–100.0)
Monocytes Absolute: 0.6 10*3/uL (ref 0.1–1.0)
Monocytes Relative: 6 %
Neutro Abs: 5.9 10*3/uL (ref 1.7–7.7)
Neutrophils Relative %: 64 %
Platelets: 359 10*3/uL (ref 150–400)
RBC: 3.16 MIL/uL — ABNORMAL LOW (ref 3.87–5.11)
RDW: 19.8 % — ABNORMAL HIGH (ref 11.5–15.5)
WBC: 9.3 10*3/uL (ref 4.0–10.5)
nRBC: 0 % (ref 0.0–0.2)

## 2020-05-25 MED ORDER — CEFDINIR 300 MG PO CAPS
300.0000 mg | ORAL_CAPSULE | Freq: Two times a day (BID) | ORAL | 0 refills | Status: DC
Start: 2020-05-25 — End: 2020-05-29

## 2020-05-25 MED ORDER — RIVAROXABAN 20 MG PO TABS
20.0000 mg | ORAL_TABLET | Freq: Every day | ORAL | 0 refills | Status: DC
Start: 2020-05-25 — End: 2020-07-05

## 2020-05-25 NOTE — TOC Transition Note (Signed)
Transition of Care [facility]) - CM/SW Discharge Note   Patient Details  Name: [place] Condon MRN: [id number] Date of Birth: 1945/09/20  [facility]) CM/SW Contact:  Elliot Gurney [place], [place] Phone Number: [phone] 05/25/2020, 10:37 AM   Clinical Narrative:    Phone call from [facility]. Patient to return to [facility] today. Transport to be provided by  [employer]. Patient will be going to room [facility]. Report to be called to [phone]. Please ask for the [place] nurse.  [place], LCSW Transitions of Care [phone]          Patient Goals and CMS Choice        Discharge Placement                       Discharge Plan and Services                                     Social Determinants of Health (SDOH) Interventions     Readmission Risk Interventions Readmission Risk Prevention Plan 02/08/2020  Transportation Screening Complete  PCP or Specialist Appt within 5-7 Days Complete  Home Care Screening Complete  Medication Review (RN CM) Complete  Some recent data might be hidden

## 2020-05-25 NOTE — Progress Notes (Signed)
Attempted to call report in [facility], but no one picking up the phone from [place]. To try again later.

## 2020-05-25 NOTE — Plan of Care (Signed)

## 2020-05-25 NOTE — Progress Notes (Signed)
Desats to 85 on room air. Placed on o2 2l [place] , puse ox -93 %

## 2020-05-25 NOTE — Discharge Summary (Addendum)
Physician Discharge Summary  Crystal Ashley WGN:[id number] DOB: 01-11-1946 DOA: 05/22/2020  PCP: [name], PA  Admit date: 05/22/2020 Discharge date: 05/25/2020  Time spent: 25 minutes  Recommendations for Outpatient Follow-up:  1. Recommend completion of Omnicef on 05/28/2020 for HCAP 2. Requires CBC/Chem-12 1 week 3. Adjust rate controlling medications outpatient setting as needed 4. Ordered 2l oxygen at all times on d/c given desaturated prior to d/c  Discharge Diagnoses:  Active Problems:   PNA (pneumonia)   Discharge Condition: Improved  Diet recommendation: Heart healthy diabetic with no change consistency  Filed Weights   05/22/20 2302 05/23/20 0133 05/25/20 0500  Weight: 88.9 kg 88.9 kg 88.1 kg    [place] of present illness:  74 [place] Atrial fibrillation CHADS2 score >4 on Xarelto, DM TY 2 Stage I uterine cancer status post hysterectomy hypothyroidism Prior admission 10/2018 for lobar pneumonia Anemia of chronic disease Recent history of fall 01/31/2020 with ongoing right hip pain with pelvic fractures T10/T11 fracture status post surgical decompression with fusion Mild cognitive dysfunction  Presented from [facility] 10/6 difficulty breathing pulse ox read to be 74-emergency work-up revealed probable pneumonia on CT scan  Presents to [facility] 10/6-ancillary history as per son [name] [phone].   Hospital Course:   1. Healthcare associated pneumonia versus aspiration a. No findings on cultures although somewhat not done on admission b. Speech therapy saw the patient recommended regular diet although patient does have some risks for aspiration with slow transit time on MBS c. Transitioned to [place] 300 twice daily end date 10/12 d. Needs desat screen to be done ad per PT likely will need SNF 2. Atrial fibrillation CHADS2 score >4 on Xarelto            a. Rate controlled now although intermittently  tachycardic-continuing Cardizem 180 Cd and Toprol XL 100 mg daily b. Adjust as needed as an outpatient in their service get labs including magnesium in a week 3. DM TY 2 a. Mod control-cont Metformin 100 bid--sugar ranges 94-1 78 b. Her sliding scale was held as she was not really eating much during hospital stay-treatment not requires much insulin going forward and may only require in the outpatient setting long-acting in addition to her Metformin 4. Prior stage I uterine cancer status post hysterectomy 5. Anemia chronic disease 6. Recent fall 01/31/2020 with hip pain status post fusion a. Will need SNF stay based on therapy evaluation Covid was negative she can transfer there today 7. Mild cognitive dysfunction  Procedures:  CT scan  MBS   Consultations:  None  Discharge Exam: Vitals:   05/24/20 2134 05/25/20 0803  BP: 130/72 (!) 140/98  Pulse: 87 (!) 105  Resp: 20 18  Temp: 98.5 F (36.9 C) 97.7 F (36.5 C)  SpO2: 92% 96%    General: Awake alert eating breakfast at the bedside with her son no distress seems comfortable although oxygen has been replaced Cardiovascular: S1-S2 no murmur no rub no gallop Respiratory: Clear no rales no rhonchi no wheeze Abdomen soft no rebound no guarding No lower extremity edema  Discharge Instructions   Discharge Instructions    Diet - low sodium heart healthy   Complete by: As directed    Increase activity slowly   Complete by: As directed      Allergies as of 05/25/2020      Reactions   Meat [alpha-gal] Other (See Comments)   Pt preference- No meat (beef/chicken/pork/turkey/etc.) with the exception of seafood.  Medication List    STOP taking these medications   cyanocobalamin 1000 MCG/ML injection Commonly known as: (VITAMIN B-12)   diltiazem 60 MG tablet Commonly known as: CARDIZEM   oxyCODONE-acetaminophen 5-325 MG tablet Commonly known as: PERCOCET/ROXICET   vitamin B-12 250 MCG tablet Commonly known as:  CYANOCOBALAMIN     TAKE these medications   acetaminophen 500 MG tablet Commonly known as: TYLENOL Take 1,000 mg by mouth every 8 (eight) hours.   Aspercreme Lidocaine 4 % Ptch Generic drug: Lidocaine Apply 1 patch topically in the morning and at bedtime.   atorvastatin 10 MG tablet Commonly known as: LIPITOR Take 1 tablet (10 mg total) by mouth daily. What changed: when to take this   Biofreeze 4 % Gel Generic drug: Menthol (Topical Analgesic) Apply 1 application topically daily.   calcium carbonate 500 MG chewable tablet Commonly known as: TUMS - dosed in mg elemental calcium Chew 2 tablets by mouth daily.   cefdinir 300 MG capsule Commonly known as: OMNICEF Take 1 capsule (300 mg total) by mouth every 12 (twelve) hours for 3 days.   diltiazem 180 MG 24 hr capsule Commonly known as: CARDIZEM CD Take 1 capsule (180 mg total) by mouth daily.   dorzolamide-timolol 22.3-6.8 MG/ML ophthalmic solution Commonly known as: COSOPT Place 1 drop into both eyes 2 (two) times daily.   feeding supplement (ENSURE ENLIVE) Liqd Take 237 mLs by mouth 2 (two) times daily between meals.   furosemide 40 MG tablet Commonly known as: LASIX Take 1 tablet (40 mg total) by mouth daily. What changed: Another medication with the same name was removed. Continue taking this medication, and follow the directions you see here.   insulin aspart 100 UNIT/ML injection Commonly known as: novoLOG Inject 0-15 Units into the skin 3 (three) times daily with meals. What changed:   how much to take  additional instructions   insulin aspart 100 UNIT/ML injection Commonly known as: novoLOG Inject 0-5 Units into the skin at bedtime. What changed: Another medication with the same name was changed. Make sure you understand how and when to take each.   insulin glargine 100 UNIT/ML injection Commonly known as: LANTUS Inject 0.1 mLs (10 Units total) into the skin at bedtime. What changed: how much to  take   levothyroxine 75 MCG tablet Commonly known as: SYNTHROID Take 1 tablet (75 mcg total) by mouth daily before breakfast.   metFORMIN 1000 MG tablet Commonly known as: GLUCOPHAGE Take 1 tablet (1,000 mg total) by mouth daily with breakfast.   methocarbamol 500 MG tablet Commonly known as: ROBAXIN Take 1 tablet (500 mg total) by mouth every 6 (six) hours as needed for muscle spasms.   metoprolol succinate 100 MG 24 hr tablet Commonly known as: TOPROL-XL Take 1 tablet (100 mg total) by mouth daily. Take with or immediately following a meal. What changed: Another medication with the same name was removed. Continue taking this medication, and follow the directions you see here.   ondansetron 4 MG tablet Commonly known as: ZOFRAN Take 4 mg by mouth every 8 (eight) hours as needed for nausea or vomiting.   pantoprazole 40 MG tablet Commonly known as: PROTONIX Take 1 tablet (40 mg total) by mouth daily. What changed: when to take this   polyethylene glycol 17 g packet Commonly known as: MIRALAX / GLYCOLAX Take 17 g by mouth daily as needed for mild constipation. What changed: when to take this   potassium chloride 10 MEQ CR capsule Commonly known  as: MICRO-K Take 10 mEq by mouth daily.   rivaroxaban 20 MG Tabs tablet Commonly known as: XARELTO Take 1 tablet (20 mg total) by mouth daily with supper. What changed:   medication strength  how much to take   sennosides 8.8 MG/5ML syrup Commonly known as: SENOKOT Take 10 mLs by mouth 2 (two) times daily.   travoprost (benzalkonium) 0.004 % ophthalmic solution Commonly known as: TRAVATAN Place 1 drop into both eyes at bedtime.      Allergies  Allergen Reactions  . 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      The results of significant diagnostics from this hospitalization (including imaging, microbiology, ancillary and  laboratory) are listed below for reference.    Significant Diagnostic Studies: DG Chest 2 View  Result Date: 05/23/2020 CLINICAL DATA:  74 year old female with pneumonia, weakness. EXAM: CHEST - 2 VIEW COMPARISON:  Chest CTA 05/22/2020 and earlier. FINDINGS: AP and lateral views of the chest. Continued evidence of bilateral pleural effusions on the lateral view, small. Mild generalized increased interstitial opacity and mild streaky opacity at the lung bases and along the fissures. No pneumothorax. Stable mild cardiomegaly. Visualized tracheal air column is within normal limits. Multilevel lower thoracic cemented pedicle screws and connecting rods again noted. Osteopenia. No acute osseous abnormality identified. Negative visible bowel gas pattern. IMPRESSION: 1. Small bilateral pleural effusions with atelectasis. 2. Mild generalized increased interstitial opacity which could be interstitial edema or viral/atypical infection. Electronically Signed   By: Genevie Ann M.D.   On: 05/23/2020 08:12   CT Angio Chest PE W/Cm &/Or Wo Cm  Result Date: 05/22/2020 CLINICAL DATA:  Chest pain or SOB, pleurisy or effusion suspected EXAM: CT ANGIOGRAPHY CHEST WITH CONTRAST TECHNIQUE: Multidetector CT imaging of the chest was performed using the standard protocol during bolus administration of intravenous contrast. Multiplanar CT image reconstructions and MIPs were obtained to evaluate the vascular anatomy. CONTRAST:  97mL OMNIPAQUE IOHEXOL 350 MG/ML SOLN COMPARISON:  Radiograph earlier this day. FINDINGS: Cardiovascular: There are no filling defects within the pulmonary arteries to suggest pulmonary embolus. Subsegmental branches are not well assessed. Upper normal heart size. Contrast refluxes minimally into the hepatic veins. Aortic atherosclerosis without dissection or acute aortic findings. Conventional branching pattern from the aortic arch. Occasional coronary artery calcifications. No pericardial effusion.  Mediastinum/Nodes: Scattered small mediastinal lymph nodes, largest in the prevascular station measuring 11 mm. Shotty bilateral hilar nodes. No axillary adenopathy. No visualized thyroid nodule. No esophageal wall thickening. Lungs/Pleura: Moderate bilateral pleural effusions with adjacent compressive atelectasis. Partial atelectasis involving the perifissural right middle lobe, there is no evidence of central obstructing lesion. Mild patchy and ground-glass opacities in the anterior right upper lobe, series 7, image 37, and occasionally in the left upper lobe, series 7, image 24. Upper Abdomen: Minimal contrast refluxing into the hepatic veins. No other acute findings. Musculoskeletal: Posterior fusion in the lower thoracic spine with intact hardware. There are no acute or suspicious osseous abnormalities. Review of the MIP images confirms the above findings. IMPRESSION: 1. No pulmonary embolus. 2. Moderate bilateral pleural effusions with adjacent compressive atelectasis. Partial atelectasis involving the perifissural right middle lobe, no evidence of central obstructing lesion. 3. Mild patchy and ground-glass opacities in the anterior right upper lobe and occasionally in the left upper lobe, likely infectious bronchiolitis or other inflammatory process. Findings are not typical of pulmonary edema. 4. Borderline cardiomegaly. Contrast refluxing into the hepatic veins suggesting elevated  right heart pressures. 5. Mildly enlarged prevascular node is likely reactive. Aortic Atherosclerosis (ICD10-I70.0). Electronically Signed   By: Keith Rake M.D.   On: 05/22/2020 17:10   DG Chest Port 1 View  Result Date: 05/22/2020 CLINICAL DATA:  Shortness of breath. EXAM: PORTABLE CHEST 1 VIEW COMPARISON:  February 28, 2020. FINDINGS: Stable cardiomediastinal silhouette. No pneumothorax is noted. Left lung is clear. Mild right infrahilar opacity is noted which may represent atelectasis or infiltrate. Surgical posterior  fusion of lower thoracic spine is noted. IMPRESSION: Mild right infrahilar opacity is noted which may represent atelectasis or infiltrate. Electronically Signed   By: Marijo Conception M.D.   On: 05/22/2020 11:02   DG Swallowing Func-Speech Pathology  Result Date: 05/24/2020 Objective Swallowing Evaluation: Type of Study: MBS-Modified Barium Swallow Study  Patient Details Name: Crystal Ashley MRN: [id number] Date of Birth: 02/01/1946 Today's Date: 05/24/2020 Time: SLP Start Time (ACUTE ONLY): 1205 -SLP Stop Time (ACUTE ONLY): 1215 SLP Time Calculation (min) (ACUTE ONLY): 10 min Past Medical History: Past Medical History: Diagnosis Date . Atrial fibrillation ([place])  . Cancer ([place])  . Cataract  . Fracture 2001  left ankle . Glaucoma  . Hypertension  . Thyroid disease  Past Surgical History: Past Surgical History: Procedure Laterality Date . APPLICATION OF ROBOTIC ASSISTANCE FOR SPINAL PROCEDURE N/A 5/78/4696  Procedure: APPLICATION OF ROBOTIC ASSISTANCE FOR SPINAL PROCEDURE;  Surgeon: [name], MD;  Location: [place];  Service: Neurosurgery;  Laterality: N/A; . CARDIOVERSION N/A 04/08/2018  Procedure: CARDIOVERSION;  Surgeon: Adrian Prows, MD;  Location: [facility] END[facility];  Service: Cardiovascular;  Laterality: N/A; . CARDIOVERSION N/A 05/22/2019  Procedure: CARDIOVERSION;  Surgeon: Adrian Prows, MD;  Location: [place];  Service: Cardiovascular;  Laterality: N/A; . FOOT SURGERY Left  . HYSTEROSCOPY    POLYPECTOMY . LUMBAR PERCUTANEOUS PEDICLE SCREW 4 LEVEL N/A 02/26/2020  Procedure: Thoracic eight to thoracic twelve posterior percutaneous instrumentation with cement augmentation and bilateral medial facetectomy for decompression at Thoracic ten-eleven;  Surgeon: [name], MD;  Location: [place];  Service: Neurosurgery;  Laterality: N/A; HPI: 74 yo female admitted [facility] 10/6 from [place] with PNA. PMH includes afib, uterine cancer, hypothyroidism, fall with pelvic fracture and T10/11 fx s/p fusion 01/2020.   Subjective: alert, upright in chair for procedure Assessment / Plan / Recommendation CHL IP CLINICAL IMPRESSIONS 05/24/2020 Clinical Impression  A mild oropharyngeal dysphagia was exhibited with suspected component of esophageal dysphagia. Oral deficits included reduced bolus cohesion of solid PO, prolonged mastication and mild oral residuals across POs; second swallows assisted clearance. Pt with difficulty propeling barium tablet from oral cavity to hypoharynx requiring multiple liquid boluses to assist.  Pharyngeal deficits included isolated instance of reduced timing and efficiency of laryngeal vestibule closure during barium tablet series only allowing for pre and during the swallow penetration of thin liquids. Penetrates spontaneously cleared post swallow. Pt appeared to have bolus retrograde from esophagus to pharynx during barium tablet series with associated coughing, however her larger body habitus limited full viewing of eosphagus and trachea. No aspiration evidenced. Mild vallecular residuals were exhibited with POs secondary to reduced base of tongue retraction, secondary swallows reduced residuals. Upon esophageal sweep 13 mm tablet noted to pass into stomach; distal barium bolus retention was exhibited. Pt may benefit from esophageal workup per MD discretion.  SLP Visit Diagnosis Dysphagia, oropharyngeal phase (R13.12) Attention and concentration deficit following -- Frontal lobe and executive function deficit following -- Impact on safety and function Mild aspiration risk  CHL IP TREATMENT RECOMMENDATION 05/24/2020 Treatment Recommendations Therapy as outlined in treatment plan below   Prognosis 05/24/2020 Prognosis for Safe Diet Advancement Good Barriers to Reach Goals -- Barriers/Prognosis Comment -- CHL IP DIET RECOMMENDATION 05/24/2020 SLP Diet Recommendations Thin liquid;Regular solids Liquid Administration via Cup Medication Administration Crushed with puree Compensations Slow rate;Small  sips/bites;Minimize environmental distractions Postural Changes Seated upright at 90 degrees;Remain semi-upright after after feeds/meals (Comment)   CHL IP OTHER RECOMMENDATIONS 05/24/2020 Recommended Consults Consider esophageal assessment Oral Care Recommendations Oral care BID Other Recommendations --   CHL IP FOLLOW UP RECOMMENDATIONS 05/24/2020 Follow up Recommendations Skilled Nursing facility   [facility] IP FREQUENCY AND DURATION 05/24/2020 Speech Therapy Frequency (ACUTE ONLY) min 1 x/week Treatment Duration 1 week      CHL IP ORAL PHASE 05/24/2020 Oral Phase Impaired Oral - Pudding Teaspoon -- Oral - Pudding Cup -- Oral - Honey Teaspoon -- Oral - Honey Cup -- Oral - Nectar Teaspoon -- Oral - Nectar Cup Delayed oral transit Oral - Nectar Straw -- Oral - Thin Teaspoon -- Oral - Thin Cup Delayed oral transit;Lingual/palatal residue Oral - Thin Straw Lingual/palatal residue;Delayed oral transit Oral - Puree Delayed oral transit;Lingual/palatal residue;Reduced posterior propulsion Oral - Mech Soft -- Oral - Regular Decreased bolus cohesion;Delayed oral transit;Lingual/palatal residue Oral - Multi-Consistency -- Oral - Pill Reduced posterior propulsion;Incomplete tongue to palate contact;Lingual pumping;Delayed oral transit;Lingual/palatal residue Oral Phase - Comment --  CHL IP PHARYNGEAL PHASE 05/24/2020 Pharyngeal Phase Impaired Pharyngeal- Pudding Teaspoon -- Pharyngeal -- Pharyngeal- Pudding Cup -- Pharyngeal -- Pharyngeal- Honey Teaspoon -- Pharyngeal -- Pharyngeal- Honey Cup -- Pharyngeal -- Pharyngeal- Nectar Teaspoon -- Pharyngeal -- Pharyngeal- Nectar Cup Pharyngeal residue - valleculae;Reduced tongue base retraction;Delayed swallow initiation-vallecula Pharyngeal -- Pharyngeal- Nectar Straw -- Pharyngeal -- Pharyngeal- Thin Teaspoon -- Pharyngeal -- Pharyngeal- Thin Cup Delayed swallow initiation-pyriform sinuses;Reduced tongue base retraction;Pharyngeal residue - valleculae Pharyngeal -- Pharyngeal- Thin Straw  Delayed swallow initiation-vallecula;Pharyngeal residue - valleculae;Reduced tongue base retraction Pharyngeal -- Pharyngeal- Puree Pharyngeal residue - valleculae;Delayed swallow initiation-vallecula;Reduced tongue base retraction Pharyngeal -- Pharyngeal- Mechanical Soft -- Pharyngeal -- Pharyngeal- Regular Reduced tongue base retraction;Pharyngeal residue - valleculae;Delayed swallow initiation-vallecula Pharyngeal -- Pharyngeal- Multi-consistency -- Pharyngeal -- Pharyngeal- Pill Reduced epiglottic inversion;Penetration/Aspiration during swallow;Penetration/Aspiration before swallow;Pharyngeal residue - valleculae Pharyngeal Material enters airway, remains ABOVE vocal cords then ejected out;Material enters airway, remains ABOVE vocal cords and not ejected out Pharyngeal Comment --  CHL IP CERVICAL ESOPHAGEAL PHASE 05/24/2020 Cervical Esophageal Phase Impaired Pudding Teaspoon -- Pudding Cup -- Honey Teaspoon -- Honey Cup -- Nectar Teaspoon -- Nectar Cup -- Nectar Straw -- Thin Teaspoon -- Thin Cup -- Thin Straw -- Puree -- Mechanical Soft -- Regular -- Multi-consistency -- Pill Esophageal backflow into the pharynx Cervical Esophageal Comment distal esophageal barium retention upon esophageal sweep  Chelsea E Hartness MA, CCC-SLP Acute Rehabilitation Services 05/24/2020, 12:57 PM               Microbiology: Recent Results (from the past 240 hour(s))  Respiratory Panel by RT PCR (Flu A&B, Covid) - Nasopharyngeal Swab     Status: None   Collection Time: 05/22/20 10:49 AM   Specimen: Nasopharyngeal Swab  Result Value Ref Range Status   SARS Coronavirus 2 by RT PCR NEGATIVE NEGATIVE Final    Comment: (NOTE) SARS-CoV-2 target nucleic acids are NOT DETECTED.  The SARS-CoV-2 RNA is generally detectable in upper respiratoy specimens during the acute phase of infection. The lowest concentration of SARS-CoV-2 viral copies this assay can detect is 131  copies/mL. A negative result does not preclude  SARS-Cov-2 infection and should not be used as the sole basis for treatment or other patient management decisions. A negative result may occur with  improper specimen collection/handling, submission of specimen other than nasopharyngeal swab, presence of viral mutation(s) within the areas targeted by this assay, and inadequate number of viral copies (<131 copies/mL). A negative result must be combined with clinical observations, patient history, and epidemiological information. The expected result is Negative.  Fact Sheet for Patients:  PinkCheek.be  Fact Sheet for Healthcare Providers:  GravelBags.it  This test is no t yet approved or cleared by the [place] FDA and  has been authorized for detection and/or diagnosis of SARS-CoV-2 by FDA under an Emergency Use Authorization (EUA). This EUA will remain  in effect (meaning this test can be used) for the duration of the COVID-19 declaration under Section 564(b)(1) of the Act, 21 U.S.C. section 360bbb-3(b)(1), unless the authorization is terminated or revoked sooner.     Influenza A by PCR NEGATIVE NEGATIVE Final   Influenza B by PCR NEGATIVE NEGATIVE Final    Comment: (NOTE) The Xpert Xpress SARS-CoV-2/FLU/RSV assay is intended as an aid in  the diagnosis of influenza from Nasopharyngeal swab specimens and  should not be used as a sole basis for treatment. Nasal washings and  aspirates are unacceptable for Xpert Xpress SARS-CoV-2/FLU/RSV  testing.  Fact Sheet for Patients: PinkCheek.be  Fact Sheet for Healthcare Providers: GravelBags.it  This test is not yet approved or cleared by the [place] FDA and  has been authorized for detection and/or diagnosis of SARS-CoV-2 by  FDA under an Emergency Use Authorization (EUA). This EUA will remain  in effect (meaning this test can be used) for the duration of the   Covid-19 declaration under Section 564(b)(1) of the Act, 21  U.S.C. section 360bbb-3(b)(1), unless the authorization is  terminated or revoked. Performed at [facility], [place] [address] [place], [place] [address]   SARS Coronavirus 2 by RT PCR (hospital order, performed in [facility] hospital lab) Nasopharyngeal Nasopharyngeal Swab     Status: None   Collection Time: 05/24/20 10:04 AM   Specimen: Nasopharyngeal Swab  Result Value Ref Range Status   SARS Coronavirus 2 NEGATIVE NEGATIVE Final    Comment: (NOTE) SARS-CoV-2 target nucleic acids are NOT DETECTED.  The SARS-CoV-2 RNA is generally detectable in upper and lower respiratory specimens during the acute phase of infection. The lowest concentration of SARS-CoV-2 viral copies this assay can detect is 250 copies / mL. A negative result does not preclude SARS-CoV-2 infection and should not be used as the sole basis for treatment or other patient management decisions.  A negative result may occur with improper specimen collection / handling, submission of specimen other than nasopharyngeal swab, presence of viral mutation(s) within the areas targeted by this assay, and inadequate number of viral copies (<250 copies / mL). A negative result must be combined with clinical observations, patient history, and epidemiological information.  Fact Sheet for Patients:   StrictlyIdeas.no  Fact Sheet for Healthcare Providers: BankingDealers.co.za  This test is not yet approved or  cleared by the [place] FDA and has been authorized for detection and/or diagnosis of SARS-CoV-2 by FDA under an Emergency Use Authorization (EUA).  This EUA will remain in effect (meaning this test can be used) for the duration of the COVID-19 declaration under Section 564(b)(1) of the Act, 21 U.S.C. section 360bbb-3(b)(1), unless the authorization is terminated or revoked  sooner.  Performed at [facility], [place] [address], [place], [place] [address]      Labs: Basic Metabolic Panel: Recent Labs  Lab 05/22/20 1128 05/22/20 2022 05/23/20 0748 05/25/20 0212  NA 140  --  142 141  K 3.0*  --  3.8 4.3  CL 95*  --  100 102  CO2 34*  --  33* 31  GLUCOSE 199*  --  128* 135*  BUN 10  --  10 11  CREATININE 0.97  --  0.94 0.94  CALCIUM 8.6*  --  8.4* 8.4*  MG  --  2.1  --   --    Liver Function Tests: Recent Labs  Lab 05/22/20 1128 05/23/20 0748 05/25/20 0212  AST 17 13* 21  ALT 11 9 7   ALKPHOS 88 74 70  BILITOT 0.6 0.4 0.7  PROT 6.4* 5.6* 5.9*  ALBUMIN 2.5* 2.1* 2.2*   No results for input(s): LIPASE, AMYLASE in the last 168 hours. No results for input(s): AMMONIA in the last 168 hours. CBC: Recent Labs  Lab 05/22/20 1128 05/23/20 0748 05/25/20 0212  WBC 12.5* 7.3 9.3  NEUTROABS 10.4*  --  5.9  HGB 9.9* 8.5* 8.3*  HCT 33.1* 30.2* 29.9*  MCV 91.7 94.7 94.6  PLT 382 337 359   Cardiac Enzymes: No results for input(s): CKTOTAL, CKMB, CKMBINDEX, TROPONINI in the last 168 hours. BNP: BNP (last 3 results) Recent Labs    02/07/20 0620 05/22/20 1128  BNP 171.1* 270.8*    ProBNP (last 3 results) No results for input(s): PROBNP in the last 8760 hours.  CBG: Recent Labs  Lab 05/22/20 2142 05/23/20 0751 05/23/20 1129 05/23/20 1624 05/24/20 0618  GLUCAP 215* 111* 178* 112* 94       Signed:  Nita Sells MD   [facility] 05/25/2020, 8:52 AM

## 2020-05-26 ENCOUNTER — Inpatient Hospital Stay (HOSPITAL_COMMUNITY)
Admission: EM | Admit: 2020-05-26 | Discharge: 2020-05-29 | DRG: 291 | Disposition: A | Discharge status: Skilled Nursing Facility | Payer: Medicare Other | Source: Skilled Nursing Facility | Attending: Internal Medicine | Admitting: Internal Medicine

## 2020-05-26 ENCOUNTER — Emergency Department (HOSPITAL_COMMUNITY): Payer: Medicare Other

## 2020-05-26 ENCOUNTER — Encounter (HOSPITAL_COMMUNITY): Payer: Self-pay | Admitting: Student

## 2020-05-26 ENCOUNTER — Other Ambulatory Visit: Payer: Self-pay

## 2020-05-26 DIAGNOSIS — Z20822 Contact with and (suspected) exposure to covid-19: Secondary | ICD-10-CM | POA: Diagnosis present

## 2020-05-26 DIAGNOSIS — Z7901 Long term (current) use of anticoagulants: Secondary | ICD-10-CM | POA: Diagnosis not present

## 2020-05-26 DIAGNOSIS — J189 Pneumonia, unspecified organism: Secondary | ICD-10-CM | POA: Diagnosis not present

## 2020-05-26 DIAGNOSIS — Z66 Do not resuscitate: Secondary | ICD-10-CM | POA: Diagnosis present

## 2020-05-26 DIAGNOSIS — E1169 Type 2 diabetes mellitus with other specified complication: Secondary | ICD-10-CM | POA: Diagnosis present

## 2020-05-26 DIAGNOSIS — I509 Heart failure, unspecified: Secondary | ICD-10-CM | POA: Diagnosis not present

## 2020-05-26 DIAGNOSIS — L8990 Pressure ulcer of unspecified site, unspecified stage: Secondary | ICD-10-CM | POA: Insufficient documentation

## 2020-05-26 DIAGNOSIS — Z8542 Personal history of malignant neoplasm of other parts of uterus: Secondary | ICD-10-CM

## 2020-05-26 DIAGNOSIS — I152 Hypertension secondary to endocrine disorders: Secondary | ICD-10-CM | POA: Diagnosis not present

## 2020-05-26 DIAGNOSIS — Z833 Family history of diabetes mellitus: Secondary | ICD-10-CM | POA: Diagnosis not present

## 2020-05-26 DIAGNOSIS — Z79899 Other long term (current) drug therapy: Secondary | ICD-10-CM | POA: Diagnosis not present

## 2020-05-26 DIAGNOSIS — J9811 Atelectasis: Secondary | ICD-10-CM | POA: Diagnosis not present

## 2020-05-26 DIAGNOSIS — E785 Hyperlipidemia, unspecified: Secondary | ICD-10-CM | POA: Diagnosis present

## 2020-05-26 DIAGNOSIS — Z23 Encounter for immunization: Secondary | ICD-10-CM | POA: Diagnosis not present

## 2020-05-26 DIAGNOSIS — E119 Type 2 diabetes mellitus without complications: Secondary | ICD-10-CM | POA: Diagnosis present

## 2020-05-26 DIAGNOSIS — M2550 Pain in unspecified joint: Secondary | ICD-10-CM | POA: Diagnosis not present

## 2020-05-26 DIAGNOSIS — J9601 Acute respiratory failure with hypoxia: Secondary | ICD-10-CM | POA: Diagnosis present

## 2020-05-26 DIAGNOSIS — M6281 Muscle weakness (generalized): Secondary | ICD-10-CM | POA: Diagnosis not present

## 2020-05-26 DIAGNOSIS — I482 Chronic atrial fibrillation, unspecified: Secondary | ICD-10-CM | POA: Diagnosis not present

## 2020-05-26 DIAGNOSIS — R0602 Shortness of breath: Secondary | ICD-10-CM | POA: Diagnosis not present

## 2020-05-26 DIAGNOSIS — I5043 Acute on chronic combined systolic (congestive) and diastolic (congestive) heart failure: Secondary | ICD-10-CM | POA: Diagnosis present

## 2020-05-26 DIAGNOSIS — R4182 Altered mental status, unspecified: Secondary | ICD-10-CM | POA: Diagnosis not present

## 2020-05-26 DIAGNOSIS — I5042 Chronic combined systolic (congestive) and diastolic (congestive) heart failure: Secondary | ICD-10-CM | POA: Diagnosis present

## 2020-05-26 DIAGNOSIS — Z794 Long term (current) use of insulin: Secondary | ICD-10-CM | POA: Diagnosis not present

## 2020-05-26 DIAGNOSIS — Z4789 Encounter for other orthopedic aftercare: Secondary | ICD-10-CM | POA: Diagnosis not present

## 2020-05-26 DIAGNOSIS — I110 Hypertensive heart disease with heart failure: Principal | ICD-10-CM | POA: Diagnosis present

## 2020-05-26 DIAGNOSIS — U071 COVID-19: Secondary | ICD-10-CM | POA: Diagnosis not present

## 2020-05-26 DIAGNOSIS — R0902 Hypoxemia: Secondary | ICD-10-CM | POA: Diagnosis not present

## 2020-05-26 DIAGNOSIS — E1159 Type 2 diabetes mellitus with other circulatory complications: Secondary | ICD-10-CM | POA: Diagnosis present

## 2020-05-26 DIAGNOSIS — Z79890 Hormone replacement therapy (postmenopausal): Secondary | ICD-10-CM

## 2020-05-26 DIAGNOSIS — L89151 Pressure ulcer of sacral region, stage 1: Secondary | ICD-10-CM | POA: Diagnosis present

## 2020-05-26 DIAGNOSIS — Y95 Nosocomial condition: Secondary | ICD-10-CM | POA: Diagnosis present

## 2020-05-26 DIAGNOSIS — R404 Transient alteration of awareness: Secondary | ICD-10-CM | POA: Diagnosis not present

## 2020-05-26 DIAGNOSIS — R262 Difficulty in walking, not elsewhere classified: Secondary | ICD-10-CM | POA: Diagnosis not present

## 2020-05-26 DIAGNOSIS — R4189 Other symptoms and signs involving cognitive functions and awareness: Secondary | ICD-10-CM

## 2020-05-26 DIAGNOSIS — Z6841 Body Mass Index (BMI) 40.0 and over, adult: Secondary | ICD-10-CM

## 2020-05-26 DIAGNOSIS — R1312 Dysphagia, oropharyngeal phase: Secondary | ICD-10-CM | POA: Diagnosis not present

## 2020-05-26 DIAGNOSIS — J80 Acute respiratory distress syndrome: Secondary | ICD-10-CM | POA: Diagnosis not present

## 2020-05-26 DIAGNOSIS — E1165 Type 2 diabetes mellitus with hyperglycemia: Secondary | ICD-10-CM | POA: Diagnosis not present

## 2020-05-26 DIAGNOSIS — R41841 Cognitive communication deficit: Secondary | ICD-10-CM | POA: Diagnosis not present

## 2020-05-26 DIAGNOSIS — R2689 Other abnormalities of gait and mobility: Secondary | ICD-10-CM | POA: Diagnosis not present

## 2020-05-26 DIAGNOSIS — E039 Hypothyroidism, unspecified: Secondary | ICD-10-CM | POA: Diagnosis not present

## 2020-05-26 DIAGNOSIS — R410 Disorientation, unspecified: Secondary | ICD-10-CM | POA: Diagnosis not present

## 2020-05-26 DIAGNOSIS — Z7401 Bed confinement status: Secondary | ICD-10-CM | POA: Diagnosis not present

## 2020-05-26 DIAGNOSIS — I4891 Unspecified atrial fibrillation: Secondary | ICD-10-CM | POA: Diagnosis not present

## 2020-05-26 DIAGNOSIS — M4325 Fusion of spine, thoracolumbar region: Secondary | ICD-10-CM | POA: Diagnosis not present

## 2020-05-26 DIAGNOSIS — M6259 Muscle wasting and atrophy, not elsewhere classified, multiple sites: Secondary | ICD-10-CM | POA: Diagnosis not present

## 2020-05-26 HISTORY — DX: Type 2 diabetes mellitus without complications: E11.9

## 2020-05-26 LAB — BRAIN NATRIURETIC PEPTIDE: B Natriuretic Peptide: 563.2 pg/mL — ABNORMAL HIGH (ref 0.0–100.0)

## 2020-05-26 LAB — CBC WITH DIFFERENTIAL/PLATELET
Abs Immature Granulocytes: 0.14 10*3/uL — ABNORMAL HIGH (ref 0.00–0.07)
Basophils Absolute: 0 10*3/uL (ref 0.0–0.1)
Basophils Relative: 0 %
Eosinophils Absolute: 0.1 10*3/uL (ref 0.0–0.5)
Eosinophils Relative: 1 %
HCT: 32.9 % — ABNORMAL LOW (ref 36.0–46.0)
Hemoglobin: 8.9 g/dL — ABNORMAL LOW (ref 12.0–15.0)
Immature Granulocytes: 1 %
Lymphocytes Relative: 15 %
Lymphs Abs: 1.8 10*3/uL (ref 0.7–4.0)
MCH: 26.6 pg (ref 26.0–34.0)
MCHC: 27.1 g/dL — ABNORMAL LOW (ref 30.0–36.0)
MCV: 98.2 fL (ref 80.0–100.0)
Monocytes Absolute: 0.5 10*3/uL (ref 0.1–1.0)
Monocytes Relative: 4 %
Neutro Abs: 9.7 10*3/uL — ABNORMAL HIGH (ref 1.7–7.7)
Neutrophils Relative %: 79 %
Platelets: 383 10*3/uL (ref 150–400)
RBC: 3.35 MIL/uL — ABNORMAL LOW (ref 3.87–5.11)
RDW: 19.7 % — ABNORMAL HIGH (ref 11.5–15.5)
WBC: 12.3 10*3/uL — ABNORMAL HIGH (ref 4.0–10.5)
nRBC: 0.2 % (ref 0.0–0.2)

## 2020-05-26 LAB — I-STAT ARTERIAL BLOOD GAS, ED
Acid-Base Excess: 7 mmol/L — ABNORMAL HIGH (ref 0.0–2.0)
Bicarbonate: 35 mmol/L — ABNORMAL HIGH (ref 20.0–28.0)
Calcium, Ion: 1.21 mmol/L (ref 1.15–1.40)
HCT: 28 % — ABNORMAL LOW (ref 36.0–46.0)
Hemoglobin: 9.5 g/dL — ABNORMAL LOW (ref 12.0–15.0)
O2 Saturation: 28 %
Potassium: 4.5 mmol/L (ref 3.5–5.1)
Sodium: 140 mmol/L (ref 135–145)
TCO2: 37 mmol/L — ABNORMAL HIGH (ref 22–32)
pCO2 arterial: 74.3 mmHg (ref 32.0–48.0)
pH, Arterial: 7.282 — ABNORMAL LOW (ref 7.350–7.450)
pO2, Arterial: 22 mmHg — CL (ref 83.0–108.0)

## 2020-05-26 LAB — GLUCOSE, CAPILLARY: Glucose-Capillary: 135 mg/dL — ABNORMAL HIGH (ref 70–99)

## 2020-05-26 LAB — COMPREHENSIVE METABOLIC PANEL
ALT: 12 U/L (ref 0–44)
AST: 22 U/L (ref 15–41)
Albumin: 2.5 g/dL — ABNORMAL LOW (ref 3.5–5.0)
Alkaline Phosphatase: 76 U/L (ref 38–126)
Anion gap: 11 (ref 5–15)
BUN: 13 mg/dL (ref 8–23)
CO2: 26 mmol/L (ref 22–32)
Calcium: 8.5 mg/dL — ABNORMAL LOW (ref 8.9–10.3)
Chloride: 104 mmol/L (ref 98–111)
Creatinine, Ser: 1.03 mg/dL — ABNORMAL HIGH (ref 0.44–1.00)
GFR, Estimated: 54 mL/min — ABNORMAL LOW (ref >60)
Glucose, Bld: 122 mg/dL — ABNORMAL HIGH (ref 70–99)
Potassium: 4.8 mmol/L (ref 3.5–5.1)
Sodium: 141 mmol/L (ref 135–145)
Total Bilirubin: 0.5 mg/dL (ref 0.3–1.2)
Total Protein: 6.3 g/dL — ABNORMAL LOW (ref 6.5–8.1)

## 2020-05-26 LAB — TSH: TSH: 5.001 u[IU]/mL — ABNORMAL HIGH (ref 0.350–4.500)

## 2020-05-26 LAB — HEMOGLOBIN A1C
Hgb A1c MFr Bld: 6.1 % — ABNORMAL HIGH (ref 4.8–5.6)
Mean Plasma Glucose: 128.37 mg/dL

## 2020-05-26 LAB — TROPONIN I (HIGH SENSITIVITY)
Troponin I (High Sensitivity): 41 ng/L — ABNORMAL HIGH (ref <18)
Troponin I (High Sensitivity): 40 ng/L — ABNORMAL HIGH (ref <18)

## 2020-05-26 LAB — RESPIRATORY PANEL BY RT PCR (FLU A&B, COVID)
Influenza A by PCR: NEGATIVE
Influenza B by PCR: NEGATIVE
SARS Coronavirus 2 by RT PCR: NEGATIVE

## 2020-05-26 LAB — LACTIC ACID, PLASMA: Lactic Acid, Venous: 1.5 mmol/L (ref 0.5–1.9)

## 2020-05-26 IMAGING — CT CT CHEST W/O CM
2 of 4 series · 15 of 36 positions shown, 18 images · IV contrast (APPLIED)
Comparison: Chest radiograph earlier same day; CT a chest
[DATE]

CLINICAL DATA: Chest pain and shortness of breath.

EXAM:
CT CHEST WITHOUT CONTRAST
TECHNIQUE: Multidetector CT imaging of the chest was performed following the
standard protocol without IV contrast.

[Series 5: thorax 2.0 · axial · 0.60mm/px · z∈[-262,-40]mm · 12 of 125 slices shown, 15 images]
[im 7/125  mediastinal]
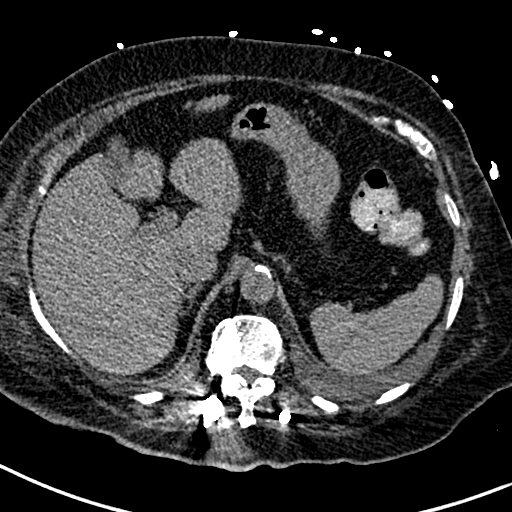
[im 7/125  lung]
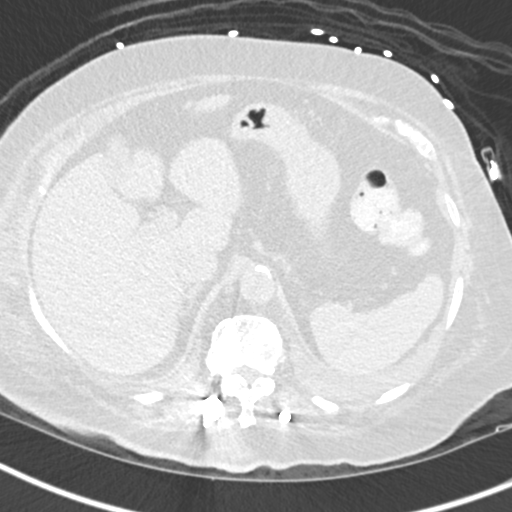
[im 20/125  lung]
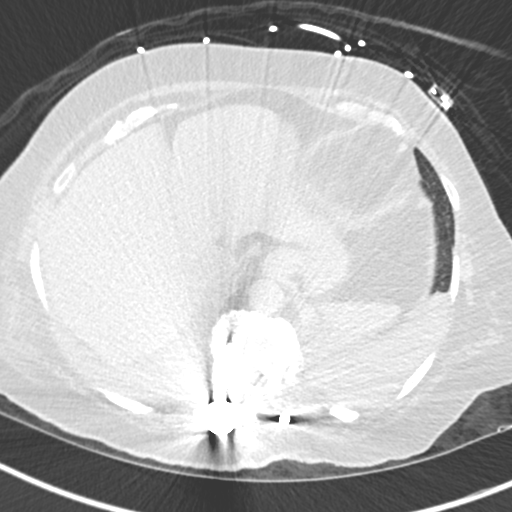
[im 27/125  lung]
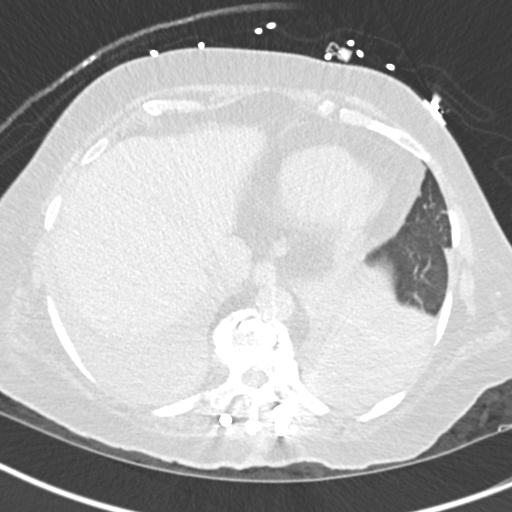
[im 40/125  lung]
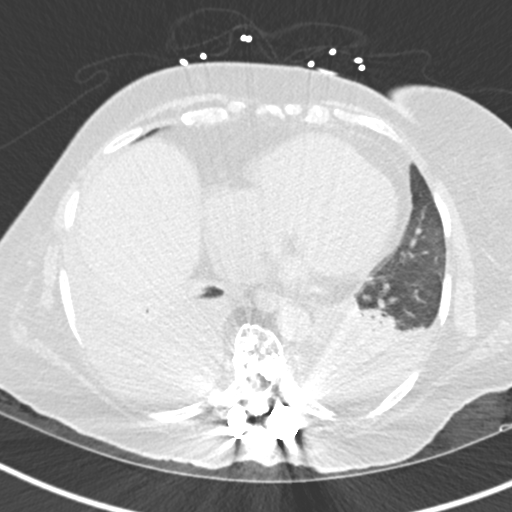
[im 46/125  mediastinal]
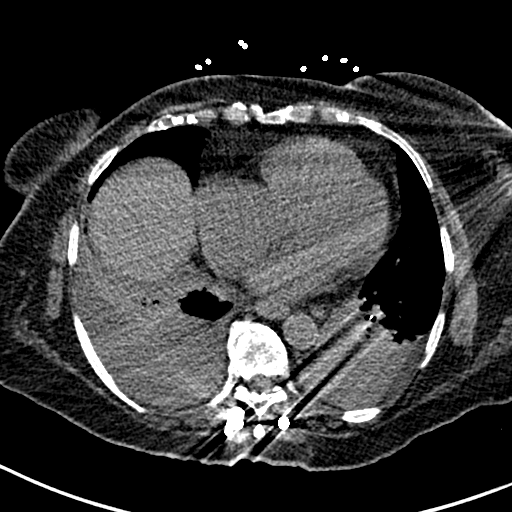
[im 46/125  lung]
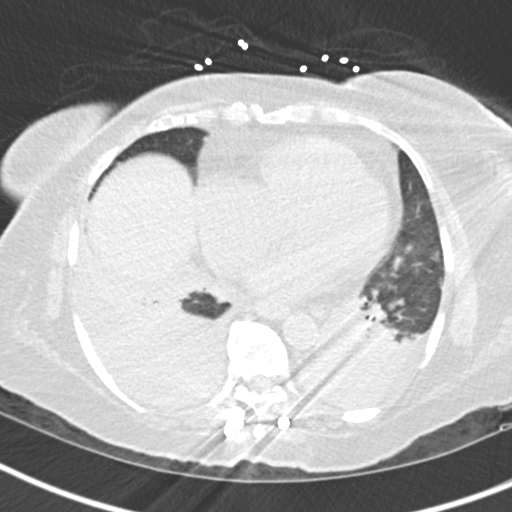
[im 59/125  lung]
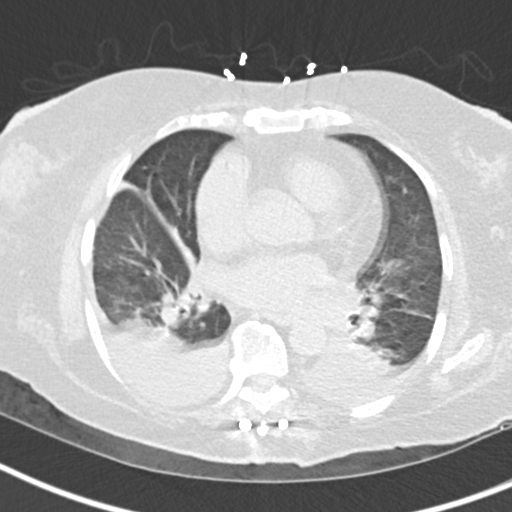
[im 66/125  lung]
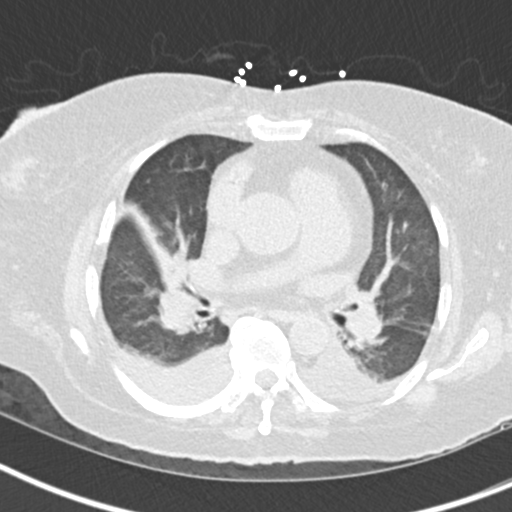
[im 79/125  lung]
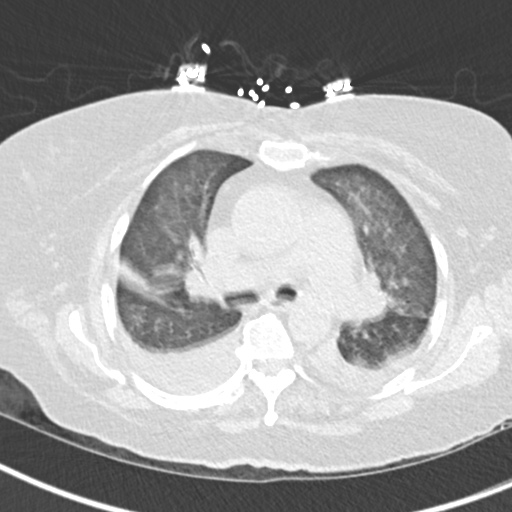
[im 85/125  mediastinal]
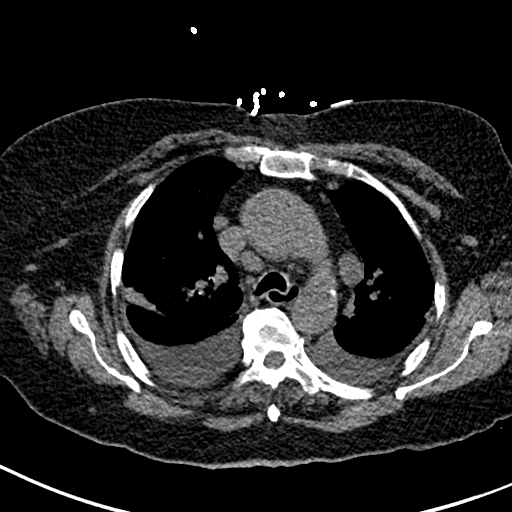
[im 85/125  lung]
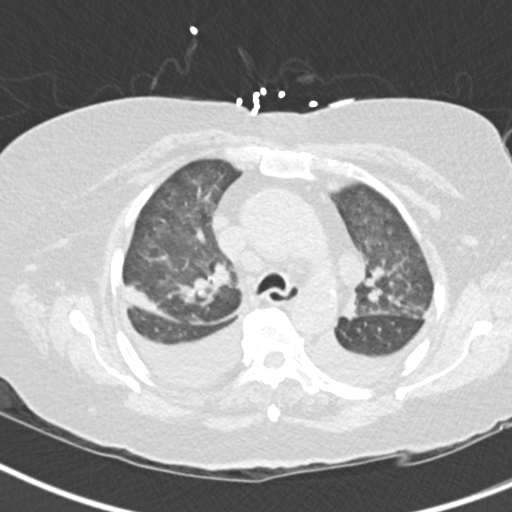
[im 98/125  lung]
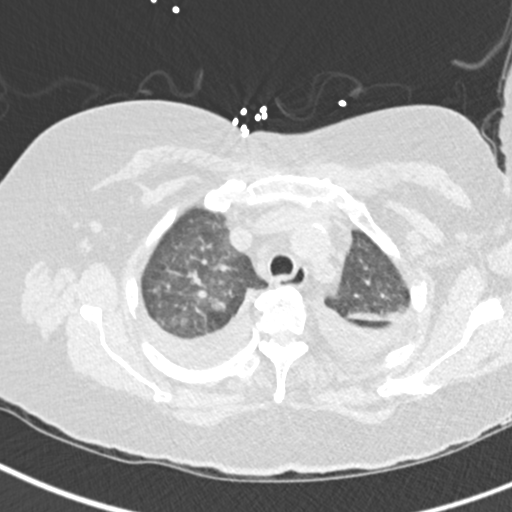
[im 105/125  lung]
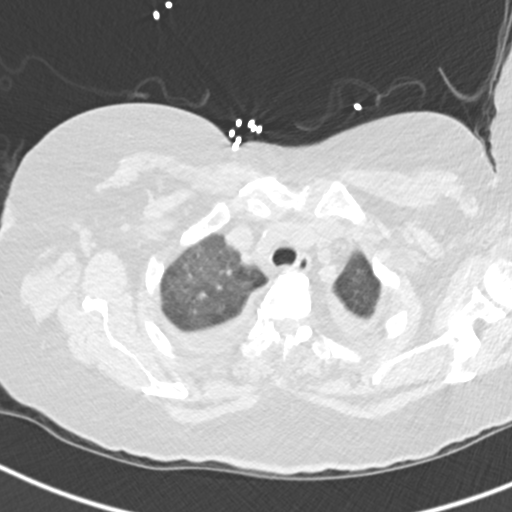
[im 118/125  lung]
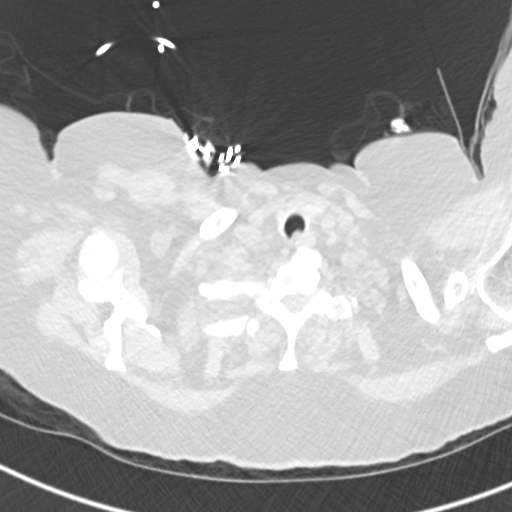

[Series 7: coronal · coronal · 0.50mm/px · 3 of 101 slices shown]
[im 21/101  lung]
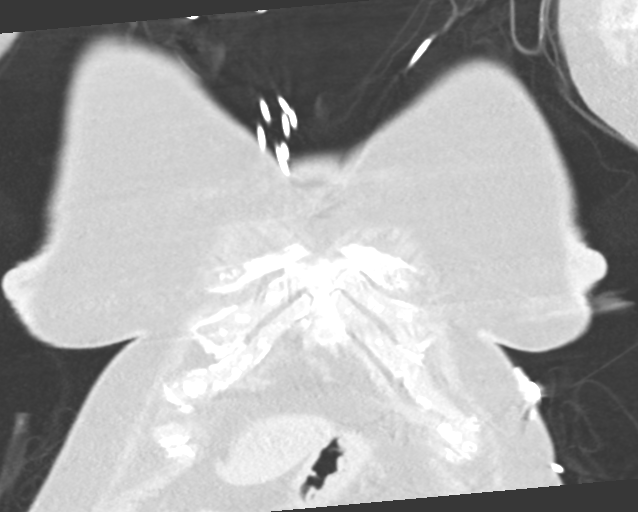
[im 41/101  lung]
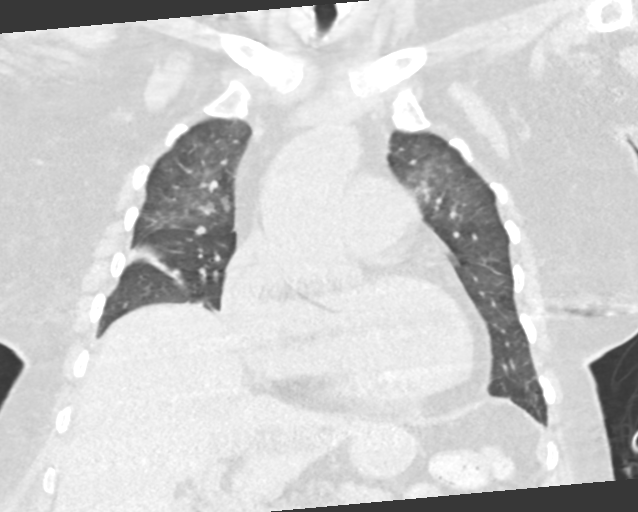
[im 61/101  lung]
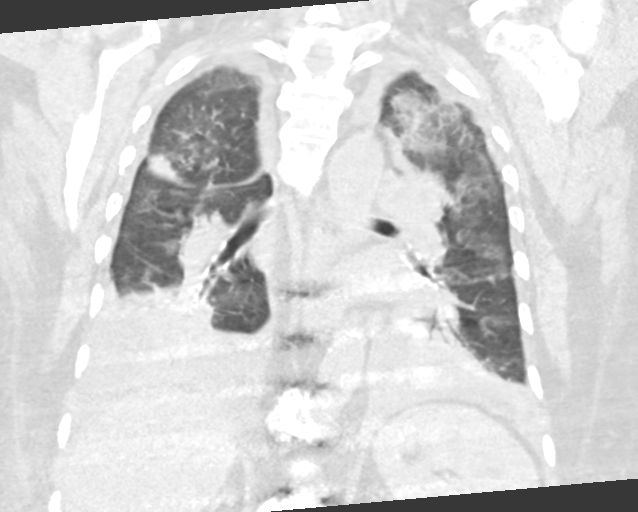

[15 of 36 positions shown; findings below may reference images not displayed]

FINDINGS: Cardiovascular: Normal heart size. Dilated pulmonary artery
measuring 4.1 cm. No pericardial effusion.

Mediastinum/Nodes: No axillary adenopathy. Similar-appearing 1.3 cm
prevascular node (image 17; series 4). Similar-appearing 10 mm right
paratracheal node (image 17; series 4). Normal appearance of the
esophagus.

Lungs/Pleura: Expiratory phase imaging. Moderate bilateral pleural
effusions, increased on the right. Bilateral lower lobe
consolidative opacities likely atelectasis. Partial atelectasis
involving the perifissural right middle lobe. Interval increase in
bilateral upper lobe patchy ground-glass opacities. No pneumothorax.

Upper Abdomen: No acute process.

Musculoskeletal: Redemonstrated posterior fusion hardware involving
the thoracic spine. No aggressive or acute appearing osseous
lesions.
IMPRESSION: 1. Moderate bilateral pleural effusions, increased on the right.
Bilateral lower lobe consolidative opacities likely atelectasis.
2. Interval increase in bilateral upper lobe patchy ground-glass
opacities which may represent atypical infectious process or
inflammatory process.
3. Dilated pulmonary artery as can be seen with pulmonary arterial
hypertension.
4. Similar-appearing mediastinal adenopathy, likely reactive in
etiology.

## 2020-05-26 IMAGING — CT CT HEAD W/O CM
4 series · 17 of 47 positions shown, 19 images · non-contrast
Comparison: Brain CT [DATE].

CLINICAL DATA: Altered mental status.

EXAM:
CT HEAD WITHOUT CONTRAST
TECHNIQUE: Contiguous axial images were obtained from the base of the skull
through the vertex without intravenous contrast.

[Series 3: head without · axial · non-contrast · 0.42mm/px · z∈[-102,+18]mm · 7 of 34 slices shown, 9 images]
[im 5/34  brain]
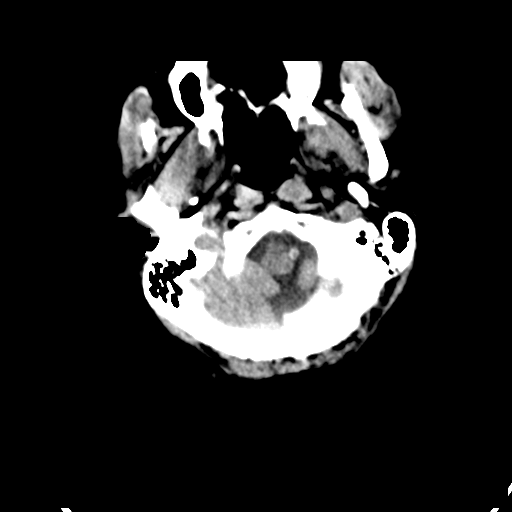
[im 5/34  bone]
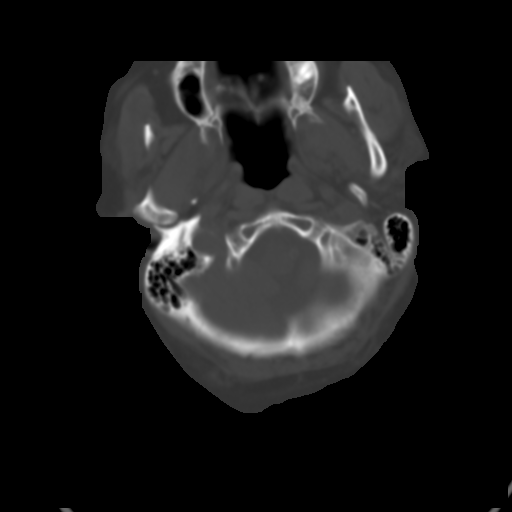
[im 9/34  brain]
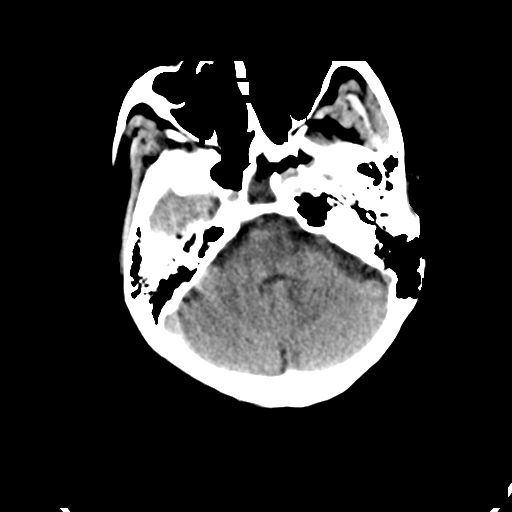
[im 13/34  brain]
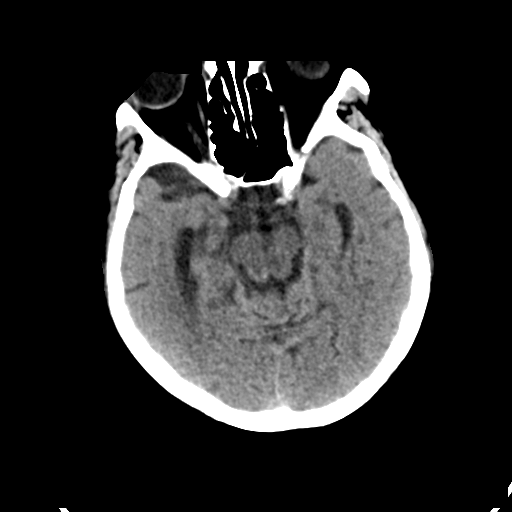
[im 17/34  brain]
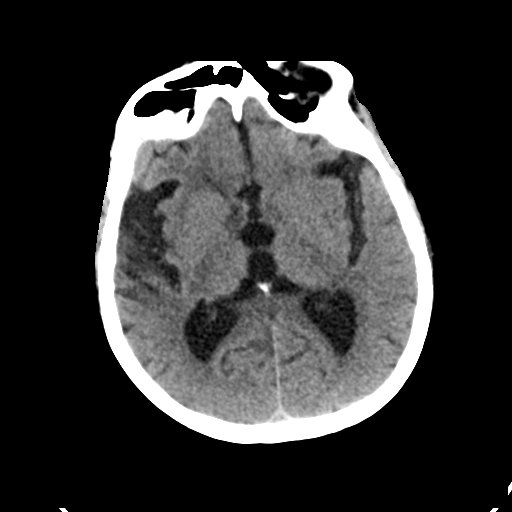
[im 21/34  brain]
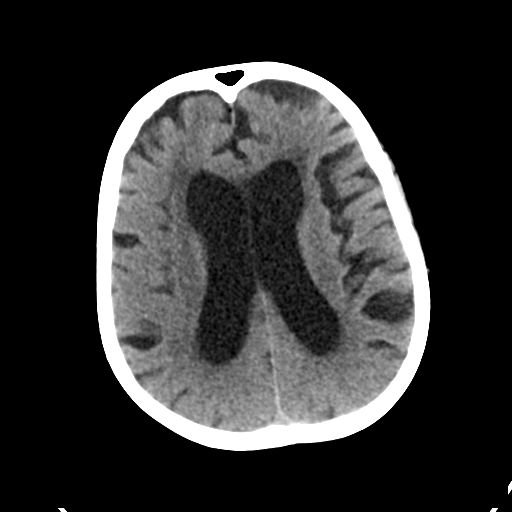
[im 21/34  bone]
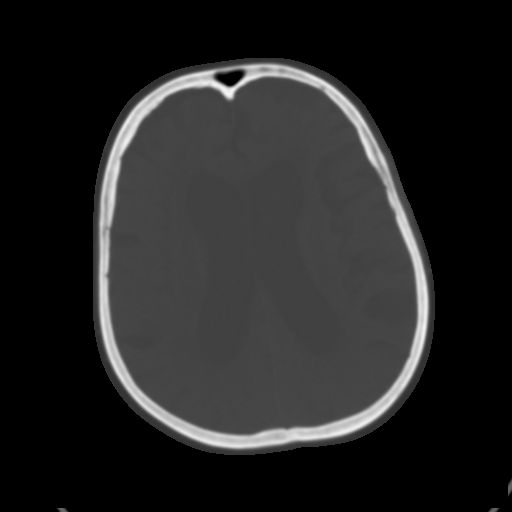
[im 25/34  brain]
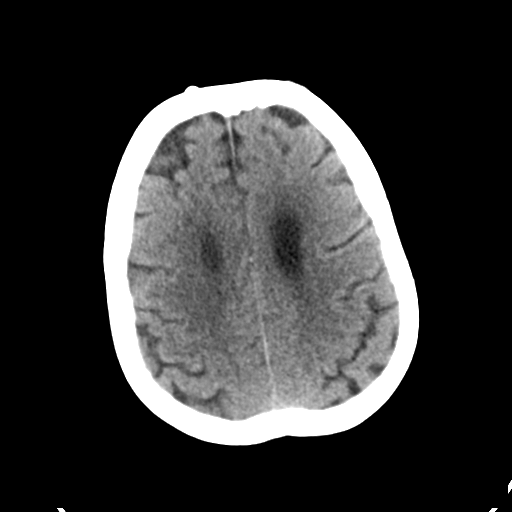
[im 29/34  brain]
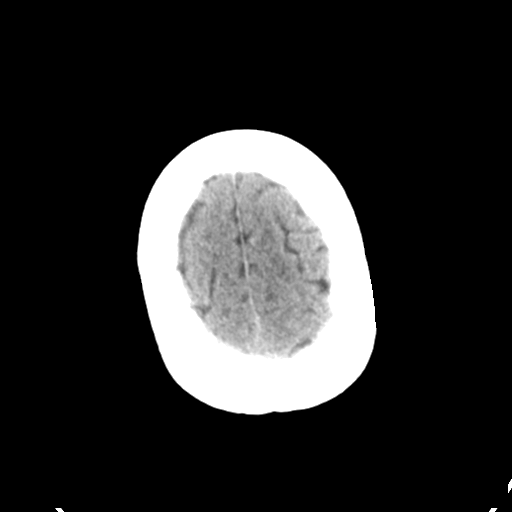

[Series 4: head bone · axial · 0.42mm/px · z∈[-106,-48]mm · 4 of 85 slices shown]
[im 9/85  bone]
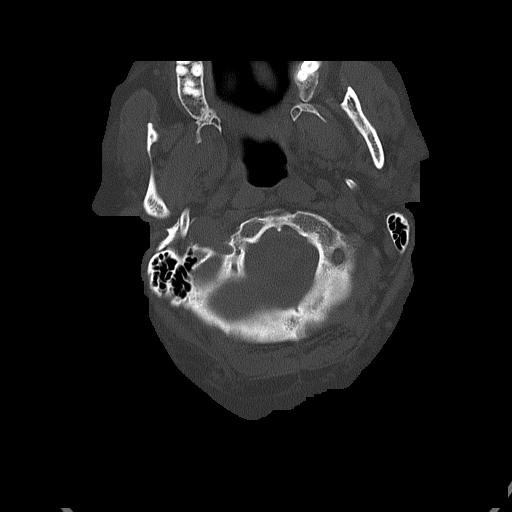
[im 17/85  bone]
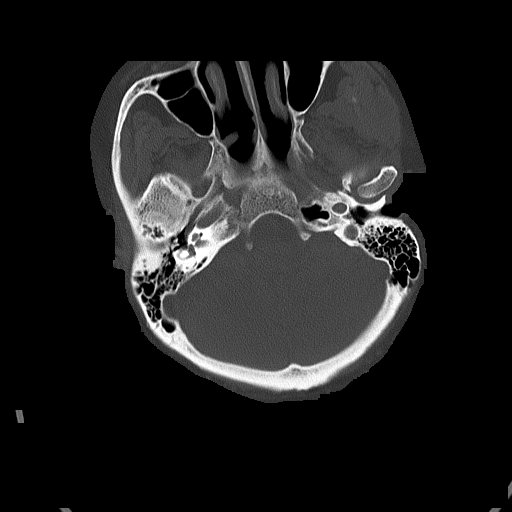
[im 26/85  bone]
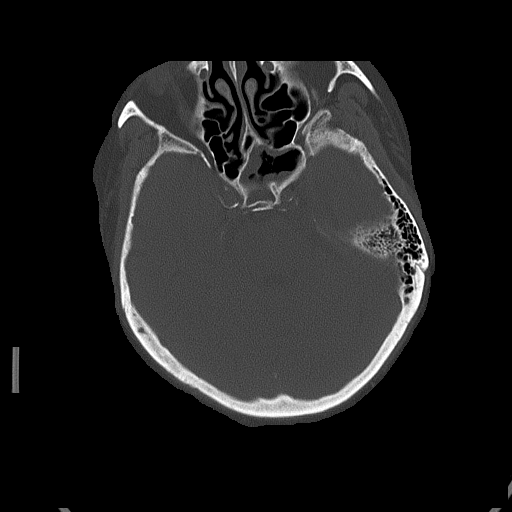
[im 38/85  bone]
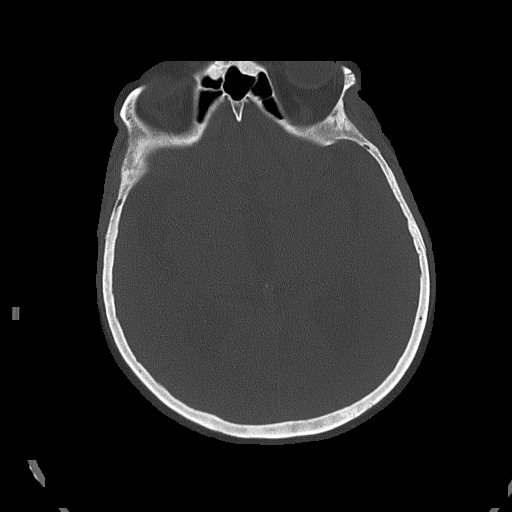

[Series 5: head without cor · coronal · non-contrast · 0.32mm/px · 3 of 67 slices shown]
[im 23/67  brain]
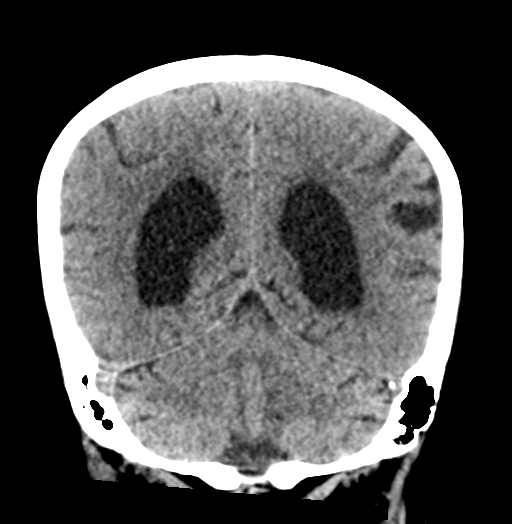
[im 30/67  brain]
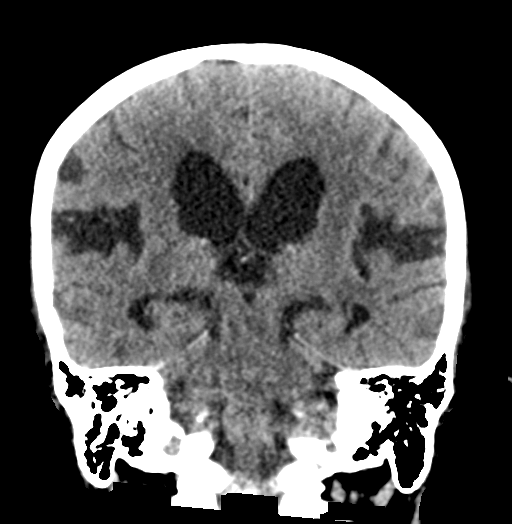
[im 37/67  brain]
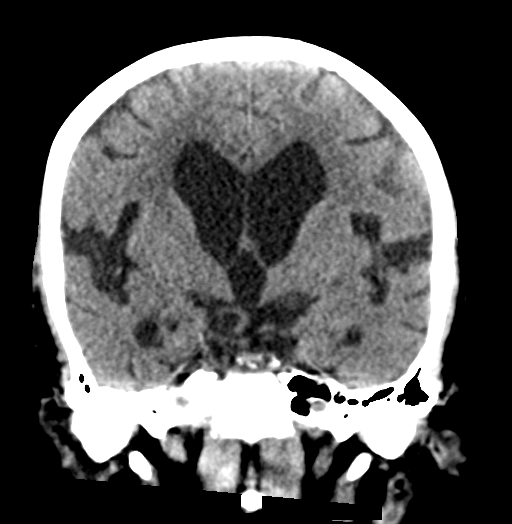

[Series 6: head without sag · sagittal · non-contrast · 0.33mm/px · 3 of 55 slices shown]
[im 19/55  brain]
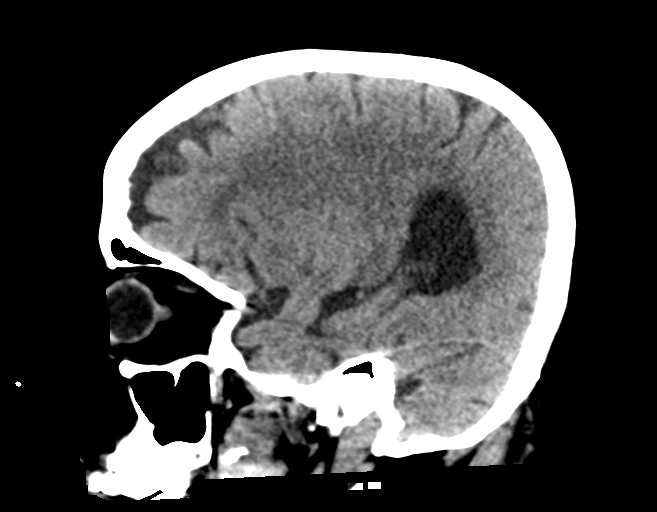
[im 28/55  brain]
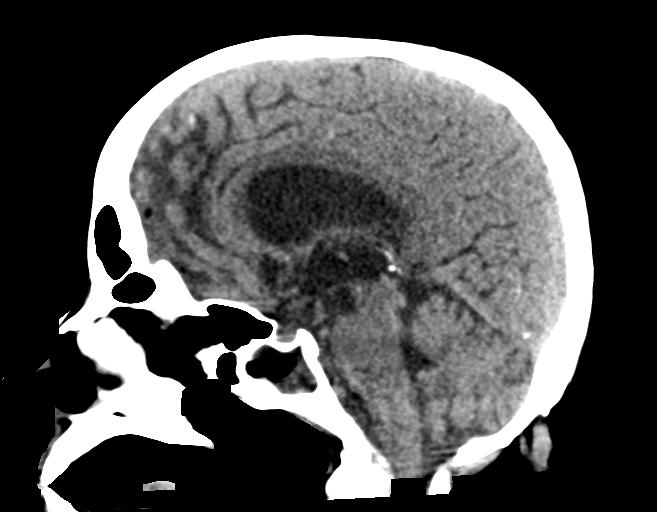
[im 36/55  brain]
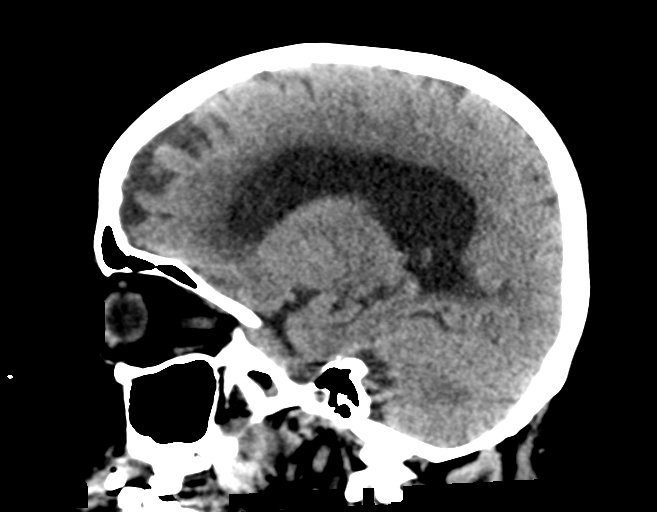

[17 of 47 positions shown; findings below may reference images not displayed]

FINDINGS: Brain: Ventricles and sulci are prominent compatible with atrophy.
Periventricular and subcortical white matter hypodensities
compatible with chronic microvascular ischemic changes. No evidence
for acute cortically based infarct, intracranial hemorrhage, mass
lesion or mass-effect.

Vascular: Unremarkable

Skull: Intact.

Sinuses/Orbits: Fluid and mucosal thickening involving the sphenoid
sinus. Remainder paranasal sinuses unremarkable. Mastoid air cells
unremarkable. Orbits unremarkable.

Other: None.
IMPRESSION: 1. No acute intracranial process.
2. Atrophy and chronic microvascular ischemic changes.

## 2020-05-26 IMAGING — DX DG CHEST 1V PORT
1 series · 1 of 1 positions shown · non-contrast
Comparison: Chest radiograph [DATE].

CLINICAL DATA: Patient with hypoxia.

EXAM:
PORTABLE CHEST 1 VIEW

[chest]
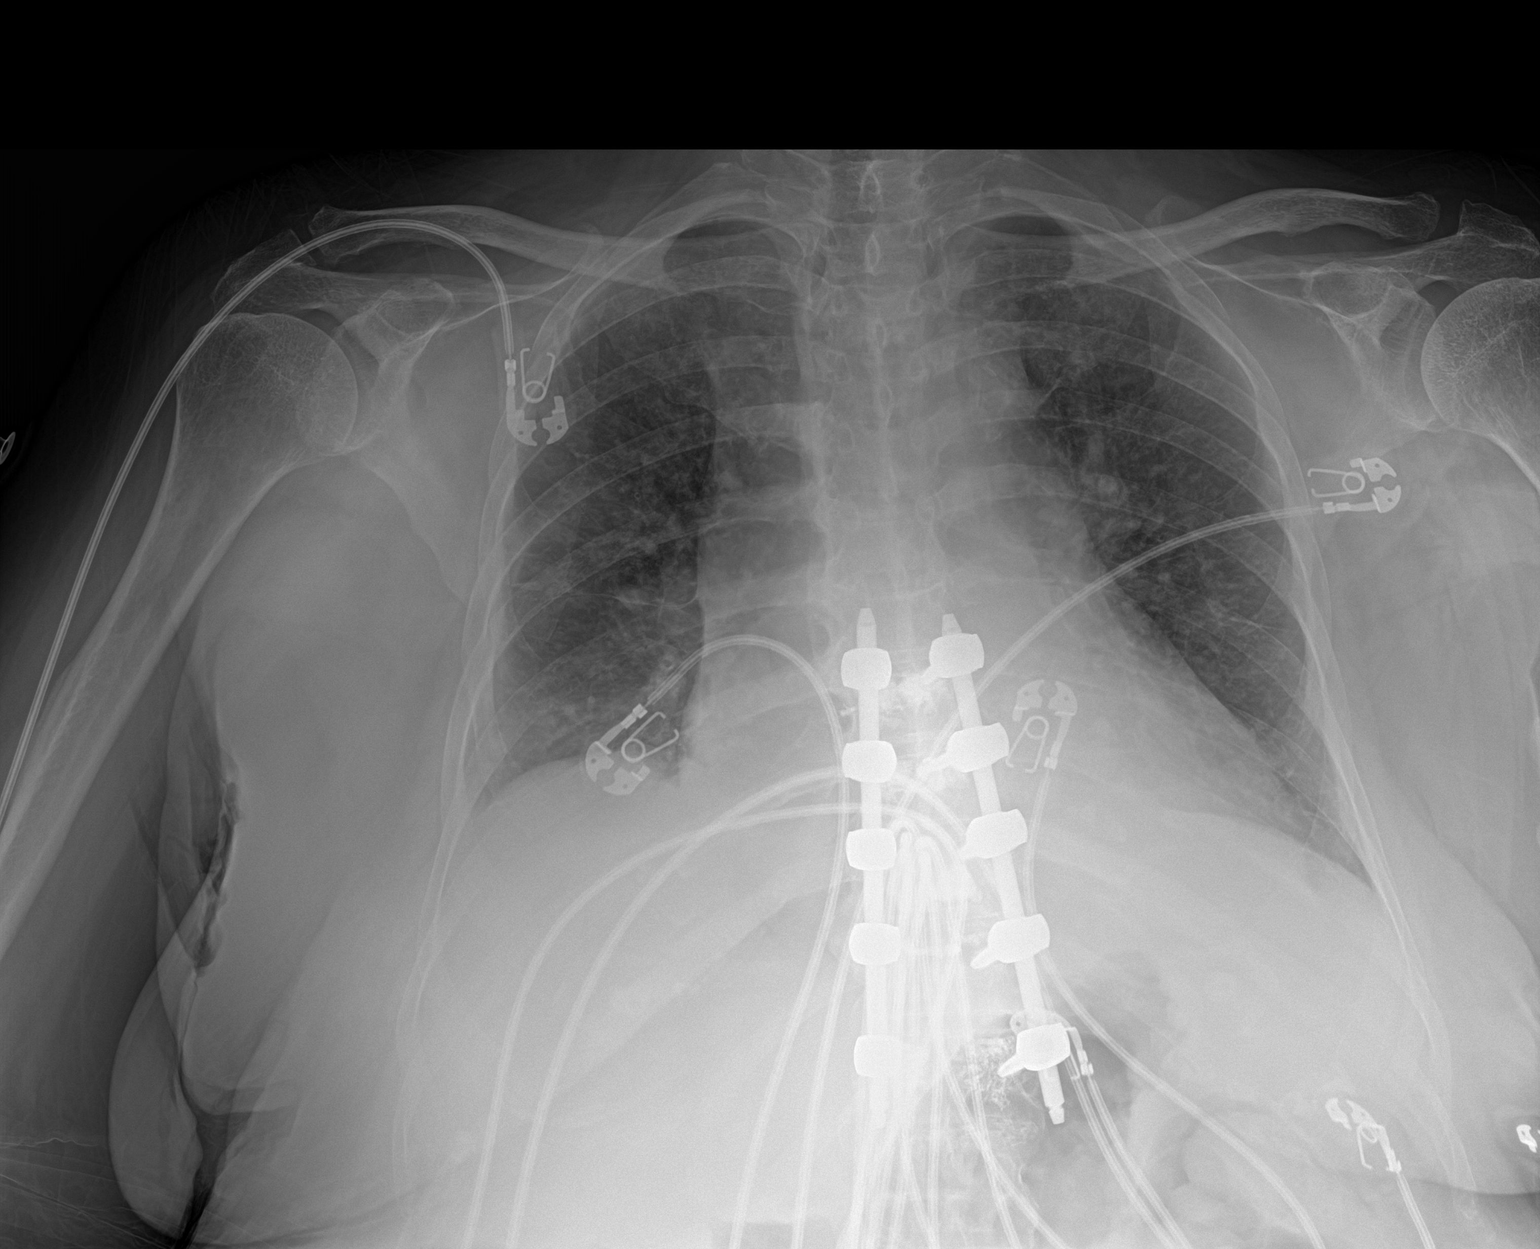

[1 of 1 positions shown; findings below may reference images not displayed]

FINDINGS: Monitoring leads overlie the patient. Stable cardiac and mediastinal
contours. Minimal bibasilar heterogeneous opacities. No pleural
effusion or pneumothorax. Thoracic spinal fusion hardware.
IMPRESSION: Bibasilar opacities favored to represent atelectasis.

## 2020-05-26 MED ORDER — ATORVASTATIN CALCIUM 10 MG PO TABS
10.0000 mg | ORAL_TABLET | Freq: Every evening | ORAL | Status: DC
  Administered 2020-05-26 – 2020-05-28 (×3): 10 mg via ORAL
  Filled 2020-05-26 (×3): qty 1

## 2020-05-26 MED ORDER — MUSCLE RUB 10-15 % EX CREA
1.0000 "application " | TOPICAL_CREAM | Freq: Every day | CUTANEOUS | Status: DC
  Administered 2020-05-27: 1 via TOPICAL
  Filled 2020-05-26: qty 85

## 2020-05-26 MED ORDER — POTASSIUM CHLORIDE CRYS ER 20 MEQ PO TBCR: 20.0000 meq | EXTENDED_RELEASE_TABLET | Freq: Once | ORAL | Status: DC

## 2020-05-26 MED ORDER — FUROSEMIDE 40 MG PO TABS
40.0000 mg | ORAL_TABLET | Freq: Every day | ORAL | Status: DC
  Filled 2020-05-26: qty 1

## 2020-05-26 MED ORDER — POTASSIUM CHLORIDE CRYS ER 10 MEQ PO TBCR
10.0000 meq | EXTENDED_RELEASE_TABLET | Freq: Every day | ORAL | Status: DC
  Administered 2020-05-26 – 2020-05-29 (×4): 10 meq via ORAL
  Filled 2020-05-26 (×4): qty 1

## 2020-05-26 MED ORDER — LEVOTHYROXINE SODIUM 75 MCG PO TABS
75.0000 ug | ORAL_TABLET | Freq: Every day | ORAL | Status: DC
  Administered 2020-05-27 – 2020-05-29 (×3): 75 ug via ORAL
  Filled 2020-05-26 (×3): qty 1

## 2020-05-26 MED ORDER — LIDOCAINE 4 % EX PTCH: 1.0000 | MEDICATED_PATCH | CUTANEOUS | Status: DC

## 2020-05-26 MED ORDER — ONDANSETRON HCL 4 MG PO TABS: 4.0000 mg | ORAL_TABLET | Freq: Three times a day (TID) | ORAL | Status: DC | PRN

## 2020-05-26 MED ORDER — SENNA 8.6 MG PO TABS
2.0000 | ORAL_TABLET | Freq: Two times a day (BID) | ORAL | Status: DC
  Administered 2020-05-27: 17.2 mg via ORAL
  Filled 2020-05-26 (×2): qty 2

## 2020-05-26 MED ORDER — POLYETHYLENE GLYCOL 3350 17 G PO PACK: 17.0000 g | PACK | Freq: Every day | ORAL | Status: DC | PRN

## 2020-05-26 MED ORDER — PIPERACILLIN-TAZOBACTAM 3.375 G IVPB: 3.3750 g | Freq: Three times a day (TID) | INTRAVENOUS | Status: DC

## 2020-05-26 MED ORDER — FUROSEMIDE 10 MG/ML IJ SOLN
40.0000 mg | Freq: Once | INTRAMUSCULAR | Status: AC
  Administered 2020-05-26: 40 mg via INTRAVENOUS
  Filled 2020-05-26: qty 4

## 2020-05-26 MED ORDER — PANTOPRAZOLE SODIUM 40 MG PO TBEC
40.0000 mg | DELAYED_RELEASE_TABLET | Freq: Two times a day (BID) | ORAL | Status: DC
  Administered 2020-05-26 – 2020-05-29 (×6): 40 mg via ORAL
  Filled 2020-05-26 (×7): qty 1

## 2020-05-26 MED ORDER — INSULIN ASPART 100 UNIT/ML ~~LOC~~ SOLN
0.0000 [IU] | Freq: Three times a day (TID) | SUBCUTANEOUS | Status: DC
  Administered 2020-05-27 – 2020-05-28 (×3): 4 [IU] via SUBCUTANEOUS
  Administered 2020-05-28: 3 [IU] via SUBCUTANEOUS
  Administered 2020-05-28: 4 [IU] via SUBCUTANEOUS
  Administered 2020-05-29: 3 [IU] via SUBCUTANEOUS
  Administered 2020-05-29: 4 [IU] via SUBCUTANEOUS

## 2020-05-26 MED ORDER — PIPERACILLIN-TAZOBACTAM 3.375 G IVPB
3.3750 g | Freq: Three times a day (TID) | INTRAVENOUS | Status: DC
  Administered 2020-05-27: 3.375 g via INTRAVENOUS

## 2020-05-26 MED ORDER — ENSURE ENLIVE PO LIQD
237.0000 mL | Freq: Two times a day (BID) | ORAL | Status: DC
  Administered 2020-05-27 – 2020-05-29 (×4): 237 mL via ORAL
  Filled 2020-05-26: qty 237

## 2020-05-26 MED ORDER — METOPROLOL SUCCINATE ER 100 MG PO TB24
100.0000 mg | ORAL_TABLET | Freq: Every day | ORAL | Status: DC
  Administered 2020-05-27 – 2020-05-29 (×3): 100 mg via ORAL
  Filled 2020-05-26 (×3): qty 1

## 2020-05-26 MED ORDER — LATANOPROST 0.005 % OP SOLN
1.0000 [drp] | Freq: Every day | OPHTHALMIC | Status: DC
  Administered 2020-05-26 – 2020-05-28 (×3): 1 [drp] via OPHTHALMIC
  Filled 2020-05-26: qty 2.5

## 2020-05-26 MED ORDER — RIVAROXABAN 20 MG PO TABS
20.0000 mg | ORAL_TABLET | Freq: Every day | ORAL | Status: DC
  Administered 2020-05-26 – 2020-05-28 (×3): 20 mg via ORAL
  Filled 2020-05-26 (×4): qty 1

## 2020-05-26 MED ORDER — DORZOLAMIDE HCL-TIMOLOL MAL 2-0.5 % OP SOLN
1.0000 [drp] | Freq: Two times a day (BID) | OPHTHALMIC | Status: DC
  Administered 2020-05-26 – 2020-05-29 (×6): 1 [drp] via OPHTHALMIC
  Filled 2020-05-26: qty 10

## 2020-05-26 MED ORDER — SENNOSIDES 8.8 MG/5ML PO SYRP
10.0000 mL | ORAL_SOLUTION | Freq: Two times a day (BID) | ORAL | Status: DC
  Filled 2020-05-26: qty 10

## 2020-05-26 MED ORDER — PIPERACILLIN-TAZOBACTAM 3.375 G IVPB 30 MIN
3.3750 g | INTRAVENOUS | Status: AC
  Administered 2020-05-26: 3.375 g via INTRAVENOUS
  Filled 2020-05-26: qty 50

## 2020-05-26 MED ORDER — DILTIAZEM HCL ER COATED BEADS 180 MG PO CP24
180.0000 mg | ORAL_CAPSULE | Freq: Every day | ORAL | Status: DC
  Administered 2020-05-27 – 2020-05-29 (×3): 180 mg via ORAL
  Filled 2020-05-26 (×3): qty 1

## 2020-05-26 MED ORDER — TRAVOPROST 0.004 % OP SOLN: 1.0000 [drp] | Freq: Every day | OPHTHALMIC | Status: DC

## 2020-05-26 MED ORDER — LIDOCAINE 5 % EX PTCH
1.0000 | MEDICATED_PATCH | CUTANEOUS | Status: DC
  Administered 2020-05-27 – 2020-05-29 (×3): 1 via TRANSDERMAL
  Filled 2020-05-26 (×3): qty 1

## 2020-05-26 MED ORDER — FUROSEMIDE 10 MG/ML IJ SOLN: 40.0000 mg | Freq: Once | INTRAMUSCULAR | Status: DC

## 2020-05-26 NOTE — ED Triage Notes (Signed)
Pt hypoxic, found obtunded in rehab, oxygen saturations in the 50s.lOC lost during incident and has been slowly gaining loc.

## 2020-05-26 NOTE — Progress Notes (Signed)
Pharmacy Antibiotic Note  Crystal Ashley is a 74 y.o. female admitted on 05/26/2020 with asp pna.  Pharmacy has been consulted for Zosyn dosing. Noted discharged back to rehab yesterday on [place] for pneumonia, HCAP versus aspiration initially treated with ceftriaxone and azithromycin but quickly transitioned to p.o. Omnicef.  Plan: Zosyn 3.375gm IV every 8 hours. Follow-up micro data, labs, vitals.   Weight: 88.1 kg (194 lb 3.6 oz)  Temp (24hrs), Avg:99 F (37.2 C), Min:99 F (37.2 C), Max:99 F (37.2 C)  Recent Labs  Lab 05/22/20 1128 05/23/20 0748 05/25/20 0212 05/26/20 1142  WBC 12.5* 7.3 9.3 12.3*  CREATININE 0.97 0.94 0.94 1.03*  LATICACIDVEN  --   --   --  1.5    Estimated Creatinine Clearance: 47.3 mL/min (A) (by C-G formula based on SCr of 1.03 mg/dL (H)).    Allergies  Allergen Reactions  . 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    Antimicrobials this admission: 10/10 Zosyn >>   Microbiology results: 10/10 BCx:   Thank you for allowing pharmacy to be a part of this patient's care.  Sherlon Handing, PharmD, BCPS Please see amion for complete clinical pharmacist phone list 05/26/2020 5:55 PM

## 2020-05-26 NOTE — H&P (Signed)
History and Physical:    Crystal Ashley   GYJ:[id number] DOB: May 12, 1946 DOA: 05/26/2020  Referring MD/provider: PA Petrocelli PCP: [name], PA   Patient coming from: Home  Chief Complaint: Obtundation secondary to hypoxia  History of Present Illness:   Crystal Ashley is an 74 y.o. female with PMH significant for atrial fibrillation, DM 2, uterine cancer status post hysterectomy, poor functional status since pelvic fracture status post fall fall June 2021 who was discharged back to rehab yesterday on Omnicef after 3-day treatment for pneumonia, HCAP versus aspiration initially treated with ceftriaxone and azithromycin but quickly transitioned to p.o. Omnicef in house.  Patient had been weaned down to 1 L oxygen and was doing well upon discharge.  Patient apparently did okay for 1 day but this morning patient was found to be obtunded in acute respiratory distress with O2 saturations in the mid 50s.    EMS was called and patient was placed on 4 L oxygen by face max sat with improvement in oxygenation.  Apparently on route patient improved and started answering questions.  Apparently by the time she arrived in the ED she was back to her baseline per her son.  To my exam patient was smiling and would answer yes/no questions appropriately.  She seemed to have a little bit of perseveration however when I pressed her she was in fact able to answer independently and in short sentences.  Patient son states this is her baseline.  Since she lost her husband of 54 years December 2021, patient has had progressive decline both in mobility as well as in functionality and apparent decline and how much she is interested in talking to people.  She has not seen a psychiatrist.  ED Course:  The patient was noted to have increased oxygen requirement again now needing 4-5 l whereas she had been on 1 L upon discharge yesterday.  Chest CT shows increased bilateral upper lobe groundglass opacities  worse than prior.  She also has some effusions and possibly increase interstitial markings suggestive of pulmonary edema.  Patient with treated with Lasix 40 mg IV times once.  ROS:   ROS   Review of Systems: Patient seems unable to give a complete review of systems.  She keeps on saying that she feels fine every time I ask her these questions.  Past Medical History:   Past Medical History:  Diagnosis Date  . Atrial fibrillation ([place])   . Cancer ([place])   . Cataract   . Fracture 2001   left ankle  . Glaucoma   . Hypertension   . Thyroid disease     Past Surgical History:   Past Surgical History:  Procedure Laterality Date  . APPLICATION OF ROBOTIC ASSISTANCE FOR SPINAL PROCEDURE N/A 02/26/2020   Procedure: APPLICATION OF ROBOTIC ASSISTANCE FOR SPINAL PROCEDURE;  Surgeon: [name], MD;  Location: [place];  Service: Neurosurgery;  Laterality: N/A;  . CARDIOVERSION N/A 04/08/2018   Procedure: CARDIOVERSION;  Surgeon: Adrian Prows, MD;  Location[facility] ENDOSCOPY;  Service: Cardiovascular;  Laterality: N/A;  . CARDIOVERSION N/A 05/22/2019   Procedure: CARDIOVERSION;  Surgeon: Adrian Prows, MD;  Location: [place];  Service: Cardiovascular;  Laterality: N/A;  . FOOT SURGERY Left   . HYSTEROSCOPY     POLYPECTOMY  . LUMBAR PERCUTANEOUS PEDICLE SCREW 4 LEVEL N/A 02/26/2020   Procedure: Thoracic eight to thoracic twelve posterior percutaneous instrumentation with cement augmentation and bilateral medial facetectomy for decompression at Thoracic ten-eleven;  Surgeon: Marcello Moores,  Dorcas Carrow, MD;  Location: [place];  Service: Neurosurgery;  Laterality: N/A;    Social History:   Social History   Socioeconomic History  . Marital status: Widowed    Spouse name: Not on file  . Number of children: 0  . Years of education: Not on file  . Highest education level: Not on file  Occupational History  . Not on file  Tobacco Use  . Smoking status: Never Smoker  . Smokeless tobacco: Never Used   Vaping Use  . Vaping Use: Never used  Substance and Sexual Activity  . Alcohol use: No  . Drug use: Never  . Sexual activity: Yes  Other Topics Concern  . 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 Difficulty of Paying Living Expenses: Not on file  Food Insecurity:   . Worried About Charity fundraiser in the Last Year: Not on file  . Ran Out of Food in the Last Year: Not on file  Transportation Needs:   . Lack of Transportation (Medical): Not on file  . Lack of Transportation (Non-Medical): Not on file  Physical Activity:   . Days of Exercise per Week: Not on file  . Minutes of Exercise per Session: Not on file  Stress:   . Feeling of Stress : Not on file  Social Connections:   . Frequency of Communication with Friends and Family: Not on file  . Frequency of Social Gatherings with Friends and Family: Not on file  . Attends Religious Services: Not on file  . Active Member of Clubs or Organizations: Not on file  . Attends Archivist Meetings: Not on file  . Marital Status: Not on file  Intimate Partner Violence:   . Fear of Current or Ex-Partner: Not on file  . Emotionally Abused: Not on file  . Physically Abused: Not on file  . Sexually Abused: Not on file    Allergies   Meat [alpha-gal]  Family history:   Family History  Problem Relation Age of Onset  . Diabetes Son   . Healthy Mother   . Healthy Father     Current Medications:   Prior to Admission medications   Medication Sig Start Date End Date Taking? Authorizing Provider  acetaminophen (TYLENOL) 500 MG tablet Take 1,000 mg by mouth every 8 (eight) hours.   Yes [provider]  atorvastatin (LIPITOR) 10 MG tablet Take 1 tablet (10 mg total) by mouth daily. Patient taking differently: Take 10 mg by mouth every evening.  12/01/19  Yes [name], PA  cefdinir  (OMNICEF) 300 MG capsule Take 1 capsule (300 mg total) by mouth every 12 (twelve) hours for 3 days. 05/25/20 05/28/20 Yes Nita Sells, MD  diltiazem (CARDIZEM CD) 180 MG 24 hr capsule Take 1 capsule (180 mg total) by mouth daily. 03/23/20  Yes Adrian Prows, MD  dorzolamide-timolol (COSOPT) 22.3-6.8 MG/ML ophthalmic solution Place 1 drop into both eyes 2 (two) times daily.  10/05/17  Yes [provider]  feeding supplement, ENSURE ENLIVE, (ENSURE ENLIVE) LIQD Take 237 mLs by mouth 2 (two) times daily between meals. 03/01/20  Yes [name], MD  furosemide (LASIX) 40 MG tablet Take 1 tablet (40 mg total) by mouth daily. 11/29/19  Yes [name], PA  insulin aspart (NOVOLOG) 100 UNIT/ML injection Inject 0-15 Units into the skin 3 (  three) times daily with meals. Patient taking differently: Inject 2-12 Units into the skin See admin instructions. Inject 2-12 units subcutaneously four times daily per sliding scale:  CBG 201-250 2 units, 251-300 4 units, 301-350 6 units, 351-400 8 units, 401-450 10 units, 451-500 12 units 03/01/20  Yes [name], MD  insulin glargine (LANTUS) 100 UNIT/ML injection Inject 0.1 mLs (10 Units total) into the skin at bedtime. 03/01/20  Yes [name], MD  levothyroxine (SYNTHROID) 75 MCG tablet Take 1 tablet (75 mcg total) by mouth daily before breakfast. 12/01/19  Yes [name], PA  Lidocaine (ASPERCREME LIDOCAINE) 4 % PTCH Place 1 patch onto the skin See admin instructions. Apply 1 patch to right mid back and right hip every morning - remove at night   Yes [provider]  Menthol, Topical Analgesic, (BIOFREEZE) 4 % GEL Apply 1 application topically daily. Apply to right side rib cage and lateral thigh   Yes [provider]  metFORMIN (GLUCOPHAGE) 1000 MG tablet Take 1 tablet (1,000 mg total) by mouth daily with breakfast. 12/01/19  Yes [name], PA  metoprolol succinate (TOPROL-XL) 100 MG 24 hr tablet Take 1  tablet (100 mg total) by mouth daily. Take with or immediately following a meal. Patient taking differently: Take 100 mg by mouth daily after breakfast. Take with or immediately following a meal. 12/01/19  Yes [name], PA  ondansetron (ZOFRAN) 4 MG tablet Take 4 mg by mouth every 8 (eight) hours as needed for nausea or vomiting.   Yes [provider]  pantoprazole (PROTONIX) 40 MG tablet Take 1 tablet (40 mg total) by mouth daily. Patient taking differently: Take 40 mg by mouth 2 (two) times daily.  03/01/20  Yes [name], MD  polyethylene glycol (MIRALAX / GLYCOLAX) 17 g packet Take 17 g by mouth daily as needed for mild constipation. Patient taking differently: Take 17 g by mouth daily as needed (constipation).  02/12/20  Yes Irene Pap N, DO  potassium chloride (MICRO-K) 10 MEQ CR capsule Take 10 mEq by mouth daily.   Yes [provider]  rivaroxaban (XARELTO) 20 MG TABS tablet Take 1 tablet (20 mg total) by mouth daily with supper. 05/25/20  Yes Nita Sells, MD  sennosides (SENOKOT) 8.8 MG/5ML syrup Take 10 mLs by mouth 2 (two) times daily.   Yes [provider]  travoprost, benzalkonium, (TRAVATAN) 0.004 % ophthalmic solution Place 1 drop into both eyes at bedtime.    Yes [provider]  vitamin B-12 (CYANOCOBALAMIN) 250 MCG tablet Take 500 mcg by mouth daily.   Yes [provider]  methocarbamol (ROBAXIN) 500 MG tablet Take 1 tablet (500 mg total) by mouth every 6 (six) hours as needed for muscle spasms. Patient not taking: Reported on 05/22/2020 03/01/20   [name], MD    Physical Exam:   Vitals:   05/26/20 1315 05/26/20 1330 05/26/20 1345 05/26/20 1400  BP: 97/72 113/67 127/87 115/73  Pulse: (!) 42 76  88  Resp: (!) 31 (!) 22 (!) 24 17  Temp:      TempSrc:      SpO2: 100% 100%  100%     Physical Exam: Blood pressure 115/73, pulse 88, temperature 99 F (37.2 C), temperature source Rectal, resp. rate  17, SpO2 100 %. Gen: Tired appearing female lying flat in bed with facemask in place in no respiratory distress.  She is smiling and conversant and answers in short sentences with no apparent  respiratory difficulty. Eyes: sclera anicteric, conjuctiva mildly injected bilaterally CVS: S1-S2,irregular, no gallops Respiratory:  decreased air entry likely secondary to decreased inspiratory effort GI: Obese, NABS, soft, NT  LE: Trace edema bilaterally Neuro: Seems to have some slowness of thought and speech and I initially thought she was perseverating some but after talking with her for a while she was able to speak in short sentences without perseveration and be appropriate. Psych: Affect is a bit odd, she seems to be very smiley.  She admits to feeling sad and lonely.   Skin: no rashes or lesions or ulcers,    Data Review:    Labs: Basic Metabolic Panel: Recent Labs  Lab 05/22/20 1128 05/22/20 2022 05/23/20 0748 05/25/20 0212 05/26/20 1027 05/26/20 1142  NA 140  --  142 141 140 141  K 3.0*  --  3.8 4.3 4.5 4.8  CL 95*  --  100 102  --  104  CO2 34*  --  33* 31  --  26  GLUCOSE 199*  --  128* 135*  --  122*  BUN 10  --  10 11  --  13  CREATININE 0.97  --  0.94 0.94  --  1.03*  CALCIUM 8.6*  --  8.4* 8.4*  --  8.5*  MG  --  2.1  --   --   --   --    Liver Function Tests: Recent Labs  Lab 05/22/20 1128 05/23/20 0748 05/25/20 0212 05/26/20 1142  AST 17 13* 21 22  ALT 11 9 7 12   ALKPHOS 88 74 70 76  BILITOT 0.6 0.4 0.7 0.5  PROT 6.4* 5.6* 5.9* 6.3*  ALBUMIN 2.5* 2.1* 2.2* 2.5*   No results for input(s): LIPASE, AMYLASE in the last 168 hours. No results for input(s): AMMONIA in the last 168 hours. CBC: Recent Labs  Lab 05/22/20 1128 05/23/20 0748 05/25/20 0212 05/26/20 1027 05/26/20 1142  WBC 12.5* 7.3 9.3  --  12.3*  NEUTROABS 10.4*  --  5.9  --  9.7*  HGB 9.9* 8.5* 8.3* 9.5* 8.9*  HCT 33.1* 30.2* 29.9* 28.0* 32.9*  MCV 91.7 94.7 94.6  --  98.2  PLT 382 337 359   --  383   Cardiac Enzymes: No results for input(s): CKTOTAL, CKMB, CKMBINDEX, TROPONINI in the last 168 hours.  BNP (last 3 results) No results for input(s): PROBNP in the last 8760 hours. CBG: Recent Labs  Lab 05/22/20 2142 05/23/20 0751 05/23/20 1129 05/23/20 1624 05/24/20 0618  GLUCAP 215* 111* 178* 112* 94    Urinalysis    Component Value Date/Time   COLORURINE YELLOW 02/22/2020 1108   APPEARANCEUR HAZY (A) 02/22/2020 1108   LABSPEC 1.010 02/22/2020 1108   PHURINE 6.0 02/22/2020 1108   GLUCOSEU NEGATIVE 02/22/2020 1108   HGBUR LARGE (A) 02/22/2020 1108   BILIRUBINUR NEGATIVE 02/22/2020 1108   KETONESUR NEGATIVE 02/22/2020 1108   PROTEINUR NEGATIVE 02/22/2020 1108   NITRITE POSITIVE (A) 02/22/2020 1108   LEUKOCYTESUR TRACE (A) 02/22/2020 1108      Radiographic Studies: CT Head Wo Contrast  Result Date: 05/26/2020 CLINICAL DATA:  Altered mental status. EXAM: CT HEAD WITHOUT CONTRAST TECHNIQUE: Contiguous axial images were obtained from the base of the skull through the vertex without intravenous contrast. COMPARISON:  Brain CT February 21, 2020. FINDINGS: Brain: Ventricles and sulci are prominent compatible with atrophy. Periventricular and subcortical white matter hypodensities compatible with chronic microvascular ischemic changes. No evidence for acute cortically based infarct, intracranial hemorrhage,  mass lesion or mass-effect. Vascular: Unremarkable Skull: Intact. Sinuses/Orbits: Fluid and mucosal thickening involving the sphenoid sinus. Remainder paranasal sinuses unremarkable. Mastoid air cells unremarkable. Orbits unremarkable. Other: None. IMPRESSION: 1. No acute intracranial process. 2. Atrophy and chronic microvascular ischemic changes. Electronically Signed   By: Lovey Newcomer M.D.   On: 05/26/2020 10:57   CT Chest Wo Contrast  Result Date: 05/26/2020 CLINICAL DATA:  Chest pain and shortness of breath. EXAM: CT CHEST WITHOUT CONTRAST TECHNIQUE: Multidetector CT  imaging of the chest was performed following the standard protocol without IV contrast. COMPARISON:  Chest radiograph earlier same day; CT a chest 05/22/2020 FINDINGS: Cardiovascular: Normal heart size. Dilated pulmonary artery measuring 4.1 cm. No pericardial effusion. Mediastinum/Nodes: No axillary adenopathy. Similar-appearing 1.3 cm prevascular node (image 17; series 4). Similar-appearing 10 mm right paratracheal node (image 17; series 4). Normal appearance of the esophagus. Lungs/Pleura: Expiratory phase imaging. Moderate bilateral pleural effusions, increased on the right. Bilateral lower lobe consolidative opacities likely atelectasis. Partial atelectasis involving the perifissural right middle lobe. Interval increase in bilateral upper lobe patchy ground-glass opacities. No pneumothorax. Upper Abdomen: No acute process. Musculoskeletal: Redemonstrated posterior fusion hardware involving the thoracic spine. No aggressive or acute appearing osseous lesions. IMPRESSION: 1. Moderate bilateral pleural effusions, increased on the right. Bilateral lower lobe consolidative opacities likely atelectasis. 2. Interval increase in bilateral upper lobe patchy ground-glass opacities which may represent atypical infectious process or inflammatory process. 3. Dilated pulmonary artery as can be seen with pulmonary arterial hypertension. 4. Similar-appearing mediastinal adenopathy, likely reactive in etiology. Electronically Signed   By: Lovey Newcomer M.D.   On: 05/26/2020 15:04   DG Chest Portable 1 View  Result Date: 05/26/2020 CLINICAL DATA:  Patient with hypoxia. EXAM: PORTABLE CHEST 1 VIEW COMPARISON:  Chest radiograph 05/23/2020. FINDINGS: Monitoring leads overlie the patient. Stable cardiac and mediastinal contours. Minimal bibasilar heterogeneous opacities. No pleural effusion or pneumothorax. Thoracic spinal fusion hardware. IMPRESSION: Bibasilar opacities favored to represent atelectasis. Electronically Signed    By: Lovey Newcomer M.D.   On: 05/26/2020 11:01    EKG: Independently reviewed.  Low voltage.  Atrial fibrillation, rate variable from 50-100.  Axis 30.  T wave inversions V1 through V4.  Otherwise diffusely flattened T waves in the limb leads.   Assessment/Plan:   Principal Problem:   Acute respiratory failure with hypoxia (HCC) Active Problems:   Hypertension associated with diabetes ([place])   Hypothyroidism   Morbid obesity with BMI of 40.0-44.9, adult (HCC)   Chronic combined systolic (congestive) and diastolic (congestive) heart failure (HCC)   Atrial fibrillation, chronic (HCC)   Controlled type 2 diabetes mellitus with hyperglycemia, without long-term current use of insulin (HCC)   PNA (pneumonia)  Acute hypoxic respiratory failure likely secondary to HCAP and decompensated heart failure I suspect patient may well have aspirated causing acute hypoxic respiratory failure We will place patient on Zosyn I do not think patient this is fluid overload so we will hold off on further Lasix Echocardiogram July 2021 showed normal EF with no diastolic dysfunction. However we will continue home doses of Lasix 40 daily  T wave inversions anteriorly on EKG Patient denies any chest pain Initial troponin was 41, possibly demand ischemia from episode of hypoxia earlier today Will repeat troponin and repeat EKG in the morning  Atrial fibrillation Patient is rate controlled on Toprol-XL 100 and diltiazem 180 We will continue both although both of them are AV nodal blockers, meds may need to be reviewed by outpatient cardiologist Continue  Xarelto  DM2 SSI AC at bedtime, follow CBG Holding off on patient's baseline glargine for now Hold Metformin  Hypothyroidism Continue Synthroid    Other information:   DVT prophylaxis: Xarelto full dose ordered. Code Status: DNR Family Communication: Patient's son was at bedside throughout Disposition Plan: TBD Consults called: None Admission  status: Inpatient  Hugh Kamara Tublu Kamisha Ell [facility]  If 7PM-7AM, please contact night-coverage [email or web address] Password [email or web address] 05/26/2020, 5:16 PM

## 2020-05-26 NOTE — ED Provider Notes (Signed)
[facility] EMERGENCY DEPARTMENT Provider Note   CSN: [id number] Arrival date & time: 05/26/20  2542     History Chief Complaint  Patient presents with  . Shortness of Breath    Crystal Ashley is a 74 y.o. female with a history of  afib on xarelto, T2DM, hypothyroidism, hyperlipidemia, hypertension, obesity, & CHF w/ last EF 65-70% who presents to the ED from rehab facility for AMS with hypoxia shortly PTA.  History is primarily provided by EMS as patient is not consistently answering questions, level 5 caveat applies.  Per EMS patient's son went to visit her today, saw her yesterday and she appeared at her baseline, today he found her obtunded off of her oxygen (which was previously set to be at 1L via [place] at baseline), nonverbal and in respiratory distress.  Initially SPO2 in the 50s, facemask applied with 4 L of supplemental oxygen with improvement into the upper 90s to 100%.  Per EMS initially patient was nonverbal, throughout transport she started to come back to, she is answering some questions.  Per chart review she had a recent hospital admission 05/22/2020 through 05/25/2020 for HCAP with new oxygen requirement, she was discharged to rehab on 2 L of oxygen via nasal cannula- seems she was titrated to 1L without issue. She currently denies pain.   HPI     Past Medical History:  Diagnosis Date  . Atrial fibrillation ([place])   . Cancer ([place])   . Cataract   . Fracture 2001   left ankle  . Glaucoma   . Hypertension   . Thyroid disease     Patient Active Problem List   Diagnosis Date Noted  . PNA (pneumonia) 05/22/2020  . Elective surgery   . Fusion of spine, thoracolumbar region 02/26/2020  . Acute blood loss anemia   . PAF (paroxysmal atrial fibrillation) ([place])   . Controlled type 2 diabetes mellitus with hyperglycemia, without long-term current use of insulin ([place])   . Fracture of vertebra due to osteoporosis ([place])   . Acute midline thoracic back pain    . Multiple traumatic injuries 02/12/2020  . Closed fracture of multiple pubic rami, right, initial encounter ([place]) 02/07/2020  . Atrial fibrillation, chronic ([place]) 02/07/2020  . Chronic combined systolic (congestive) and diastolic (congestive) heart failure ([place])   . Acute respiratory failure with hypoxia ([place]) 10/18/2018  . Declining mobility 09/23/2018  . B12 deficiency 09/23/2018  . Morbid obesity with BMI of 40.0-44.9, adult ([place]) 09/23/2018  . Anemia 09/23/2018  . History of cardioversion 04/26/2018  . Type 2 diabetes mellitus without complication, without long-term current use of insulin ([place]) 03/15/2018  . Atrial fibrillation with RVR ([place]) 03/15/2018  . Morbid obesity ([place]) 01/04/2017  . Hyperlipidemia associated with type 2 diabetes mellitus ([place]) 01/04/2017  . Vitamin D deficiency 12/15/2016  . Glaucoma   . Hypertension associated with diabetes ([place]) 04/19/2014  . Hypothyroidism 04/19/2014  . History of endometrial cancer 03/30/2014    Past Surgical History:  Procedure Laterality Date  . APPLICATION OF ROBOTIC ASSISTANCE FOR SPINAL PROCEDURE N/A 02/26/2020   Procedure: APPLICATION OF ROBOTIC ASSISTANCE FOR SPINAL PROCEDURE;  Surgeon: [name], MD;  Location: [facility];  Service: Neurosurgery;  Laterality: N/A;  . CARDIOVERSION N/A 04/08/2018   Procedure: CARDIOVERSION;  Surgeon: Adrian Prows, MD;  Location: [facility] ENDOSCOPY;  Service: Cardiovascular;  Laterality: N/A;  . CARDIOVERSION N/A 05/22/2019   Procedure: CARDIOVERSION;  Surgeon: Adrian Prows, MD;  Location: [facility];  Service: Cardiovascular;  Laterality: N/A;  . FOOT SURGERY Left   . HYSTEROSCOPY     POLYPECTOMY  . LUMBAR PERCUTANEOUS PEDICLE SCREW 4 LEVEL N/A 02/26/2020   Procedure: Thoracic eight to thoracic twelve posterior percutaneous instrumentation with cement augmentation and bilateral medial facetectomy for decompression at Thoracic ten-eleven;  Surgeon: [name], MD;  Location: [facility];  Service:  Neurosurgery;  Laterality: N/A;     OB History    Gravida  0   Para      Term      Preterm      AB      Living        SAB      TAB      Ectopic      Multiple      Live Births              Family History  Problem Relation Age of Onset  . Diabetes Son   . Healthy Mother   . Healthy Father     Social History   Tobacco Use  . Smoking status: Never Smoker  . Smokeless tobacco: Never Used  Vaping Use  . Vaping Use: Never used  Substance Use Topics  . Alcohol use: No  . Drug use: Never    Home Medications Prior to Admission medications   Medication Sig Start Date End Date Taking? Authorizing Provider  acetaminophen (TYLENOL) 500 MG tablet Take 1,000 mg by mouth every 8 (eight) hours.    [provider]  atorvastatin (LIPITOR) 10 MG tablet Take 1 tablet (10 mg total) by mouth daily. Patient taking differently: Take 10 mg by mouth at bedtime.  12/01/19   [name], PA  calcium carbonate (TUMS - DOSED IN MG ELEMENTAL CALCIUM) 500 MG chewable tablet Chew 2 tablets by mouth daily.    [provider]  cefdinir (OMNICEF) 300 MG capsule Take 1 capsule (300 mg total) by mouth every 12 (twelve) hours for 3 days. 05/25/20 05/28/20  Nita Sells, MD  diltiazem (CARDIZEM CD) 180 MG 24 hr capsule Take 1 capsule (180 mg total) by mouth daily. Patient not taking: Reported on 05/22/2020 03/23/20   Adrian Prows, MD  dorzolamide-timolol (COSOPT) 22.3-6.8 MG/ML ophthalmic solution Place 1 drop into both eyes 2 (two) times daily.  10/05/17   [provider]  feeding supplement, ENSURE ENLIVE, (ENSURE ENLIVE) LIQD Take 237 mLs by mouth 2 (two) times daily between meals. Patient not taking: Reported on 05/22/2020 03/01/20   [name], MD  furosemide (LASIX) 40 MG tablet Take 1 tablet (40 mg total) by mouth daily. Patient not taking: Reported on 05/22/2020 11/29/19   [name], PA  insulin aspart (NOVOLOG) 100 UNIT/ML injection Inject  0-15 Units into the skin 3 (three) times daily with meals. Patient taking differently: Inject 0-12 Units into the skin 3 (three) times daily with meals. Blood Sugar  70-200=0 units 201-250=2 units 251-300=4 units 301-350=6 units 351-400=8 units 401-450=10 units 451-500=12 units BS>500 Call NP/PA 03/01/20   [name], MD  insulin aspart (NOVOLOG) 100 UNIT/ML injection Inject 0-5 Units into the skin at bedtime. Patient not taking: Reported on 05/22/2020 03/01/20   [name], MD  insulin glargine (LANTUS) 100 UNIT/ML injection Inject 0.1 mLs (10 Units total) into the skin at bedtime. Patient taking differently: Inject 14 Units into the skin at bedtime.  03/01/20   [name], MD  levothyroxine (SYNTHROID) 75 MCG tablet Take 1 tablet (75 mcg total) by mouth daily before  breakfast. 12/01/19   [name], PA  Lidocaine (ASPERCREME LIDOCAINE) 4 % PTCH Apply 1 patch topically in the morning and at bedtime.    [provider]  Menthol, Topical Analgesic, (BIOFREEZE) 4 % GEL Apply 1 application topically daily.    [provider]  metFORMIN (GLUCOPHAGE) 1000 MG tablet Take 1 tablet (1,000 mg total) by mouth daily with breakfast. Patient not taking: Reported on 05/22/2020 12/01/19   [name], PA  methocarbamol (ROBAXIN) 500 MG tablet Take 1 tablet (500 mg total) by mouth every 6 (six) hours as needed for muscle spasms. Patient not taking: Reported on 05/22/2020 03/01/20   [name], MD  metoprolol succinate (TOPROL-XL) 100 MG 24 hr tablet Take 1 tablet (100 mg total) by mouth daily. Take with or immediately following a meal. Patient not taking: Reported on 05/22/2020 12/01/19   [name], PA  ondansetron (ZOFRAN) 4 MG tablet Take 4 mg by mouth every 8 (eight) hours as needed for nausea or vomiting.    [provider]  pantoprazole (PROTONIX) 40 MG tablet Take 1 tablet (40 mg total) by mouth daily. Patient taking differently: Take  40 mg by mouth 2 (two) times daily.  03/01/20   [name], MD  polyethylene glycol (MIRALAX / GLYCOLAX) 17 g packet Take 17 g by mouth daily as needed for mild constipation. Patient taking differently: Take 17 g by mouth daily.  02/12/20   Kayleen Memos, DO  potassium chloride (MICRO-K) 10 MEQ CR capsule Take 10 mEq by mouth daily.    [provider]  rivaroxaban (XARELTO) 20 MG TABS tablet Take 1 tablet (20 mg total) by mouth daily with supper. 05/25/20   Nita Sells, MD  sennosides (SENOKOT) 8.8 MG/5ML syrup Take 10 mLs by mouth 2 (two) times daily.    [provider]  travoprost, benzalkonium, (TRAVATAN) 0.004 % ophthalmic solution Place 1 drop into both eyes at bedtime.     [provider]    Allergies    Meat [alpha-gal]  Review of Systems   Review of Systems  Unable to perform ROS: Mental status change    Physical Exam Updated Vital Signs BP 101/84 (BP Location: Left Arm)   Pulse 89   Temp 99 F (37.2 C) (Rectal)   Resp (!) 25   SpO2 100%   Physical Exam Vitals and nursing note reviewed.  Constitutional:      Appearance: She is not toxic-appearing.  HENT:     Head: Normocephalic and atraumatic.  Eyes:     Pupils: Pupils are equal, round, and reactive to light.  Cardiovascular:     Comments: Irregularly irregular.  Pulmonary:     Effort: Tachypnea (mild intermittent) present.     Breath sounds: Decreased breath sounds (@ the bases) present. No wheezing.     Comments: SpO2 100% on 3L via face mask.  Abdominal:     Tenderness: There is no abdominal tenderness. There is no guarding or rebound.     Comments: Mild distension noted.   Musculoskeletal:     Cervical back: Neck supple. No rigidity.     Right lower leg: Edema present.     Left lower leg: Edema present.     Comments: Symmetric bilateral LE 2+ pitting edema.   Skin:    General: Skin is dry.  Neurological:     Mental Status: She is alert.     Comments: Alert.  Oriented to person. Answers some yes & no questions.  ED Results / Procedures / Treatments   Labs (all labs ordered are listed, but only abnormal results are displayed) Labs Reviewed  COMPREHENSIVE METABOLIC PANEL - Abnormal; Notable for the following components:      Result Value   Glucose, Bld 122 (*)    Creatinine, Ser 1.03 (*)    Calcium 8.5 (*)    Total Protein 6.3 (*)    Albumin 2.5 (*)    GFR, Estimated 54 (*)    All other components within normal limits  CBC WITH DIFFERENTIAL/PLATELET - Abnormal; Notable for the following components:   WBC 12.3 (*)    RBC 3.35 (*)    Hemoglobin 8.9 (*)    HCT 32.9 (*)    MCHC 27.1 (*)    RDW 19.7 (*)    Neutro Abs 9.7 (*)    Abs Immature Granulocytes 0.14 (*)    All other components within normal limits  TSH - Abnormal; Notable for the following components:   TSH 5.001 (*)    All other components within normal limits  BRAIN NATRIURETIC PEPTIDE - Abnormal; Notable for the following components:   B Natriuretic Peptide 563.2 (*)    All other components within normal limits  I-STAT ARTERIAL BLOOD GAS, ED - Abnormal; Notable for the following components:   pH, Arterial 7.282 (*)    pCO2 arterial 74.3 (*)    pO2, Arterial 22 (*)    Bicarbonate 35.0 (*)    TCO2 37 (*)    Acid-Base Excess 7.0 (*)    HCT 28.0 (*)    Hemoglobin 9.5 (*)    All other components within normal limits  TROPONIN I (HIGH SENSITIVITY) - Abnormal; Notable for the following components:   Troponin I (High Sensitivity) 41 (*)    All other components within normal limits  CULTURE, BLOOD (ROUTINE X 2)  RESPIRATORY PANEL BY RT PCR (FLU A&B, COVID)  CULTURE, BLOOD (ROUTINE X 2)  LACTIC ACID, PLASMA  CBG MONITORING, ED  TROPONIN I (HIGH SENSITIVITY)    EKG EKG Interpretation  Date/Time:  Sunday May 26 2020 09:57:08 EDT Ventricular Rate:  94 PR Interval:    QRS Duration: 97 QT Interval:  376 QTC Calculation: 461 R Axis:   33 Text  Interpretation: Atrial fibrillation Ventricular premature complex Low voltage, extremity leads Abnormal T, consider ischemia, anterior leads Confirmed by Dene Gentry [phone]) on 05/26/2020 9:59:00 AM   Radiology CT Head Wo Contrast  Result Date: 05/26/2020 CLINICAL DATA:  Altered mental status. EXAM: CT HEAD WITHOUT CONTRAST TECHNIQUE: Contiguous axial images were obtained from the base of the skull through the vertex without intravenous contrast. COMPARISON:  Brain CT February 21, 2020. FINDINGS: Brain: Ventricles and sulci are prominent compatible with atrophy. Periventricular and subcortical white matter hypodensities compatible with chronic microvascular ischemic changes. No evidence for acute cortically based infarct, intracranial hemorrhage, mass lesion or mass-effect. Vascular: Unremarkable Skull: Intact. Sinuses/Orbits: Fluid and mucosal thickening involving the sphenoid sinus. Remainder paranasal sinuses unremarkable. Mastoid air cells unremarkable. Orbits unremarkable. Other: None. IMPRESSION: 1. No acute intracranial process. 2. Atrophy and chronic microvascular ischemic changes. Electronically Signed   By: Lovey Newcomer M.D.   On: 05/26/2020 10:57   DG Chest Portable 1 View  Result Date: 05/26/2020 CLINICAL DATA:  Patient with hypoxia. EXAM: PORTABLE CHEST 1 VIEW COMPARISON:  Chest radiograph 05/23/2020. FINDINGS: Monitoring leads overlie the patient. Stable cardiac and mediastinal contours. Minimal bibasilar heterogeneous opacities. No pleural effusion or pneumothorax. Thoracic spinal fusion hardware. IMPRESSION: Bibasilar opacities favored to  represent atelectasis. Electronically Signed   By: Lovey Newcomer M.D.   On: 05/26/2020 11:01    Procedures Procedures (including critical care time)  Medications Ordered in ED Medications - No data to display  ED Course  I have reviewed the triage vital signs and the nursing notes.  Pertinent labs & imaging results that were available during my  care of the patient were reviewed by me and considered in my medical decision making (see chart for details).  MDM Rules/Calculators/A&P                         Patient presents to the ED with from rehab facility for episode of altered mental status with hypoxia which seems to be improving with supplemental oxygen.  Patient is nontoxic appearing, she does not appear to be in respiratory distress at this time, mild intermittent tachypnea, lungs with some decreased breath sounds bibasilarly, maintaining SpO2 on 3L via face mask. Vitals otherwise WNL. Peripheral edema noted.   Additional history obtained:  Additional history obtained from EMS as well as nursing note and chart review.  Patient with recent admission 10/6 through 10/9 for HCAP w/ new oxygen requirement, discharged yesterday  ED EKG: Atrial fibrillation Ventricular premature complex Low voltage, extremity leads Abnormal T, consider ischemia, anterior leads  Lab Tests:  I Ordered, reviewed, and interpreted labs, which included:  Resulted ABG is a venous sample, unable to get arterial sample per RT- Elevated CO2 @ 74.3, pH 7.282 CBC: Mild leukocytosis.  Anemia which is similar to patient's baseline. CMP: Very mildly worsened renal function compared to prior. No significant electrolyte derangement.  Lactic acid: WNL Troponin: Elevated some at 41 BNP: Elevated more so than previous in the 500s.  TSH: Pending  Imaging Studies ordered:  I ordered imaging studies which included CT head & CXR, I independently visualized and interpreted imaging which showed:  CT head: 1. No acute intracranial process. 2. Atrophy and chronic microvascular ischemic changes. CXR: Bibasilar opacities favored to represent atelectasis.   Suspect patient's episode of altered mental status was related to her hypoxia, per family she is back to her mental status baseline, maintaining her SPO2 still on her face mask oxygen delivery.  Her hypoxic event's cause is not  definitively clear, with her elevated BNP notable peripheral edema with history of CHF concerned her acute hypoxia may have been related to HF- troponin mildly elevated- suspect secondary to demand. She was recently admitted for pneumonia, however no focal infiltrate on CXR- does have mild leukocytosis, currently suppose to be on oral omnicef, defer abx to hospitalist service.  With her currently being on Xarelto feel that a pulmonary embolism would be less likely, but has been considered. Patient back to mental status baseline per family, saturating well on 3L.  Will give IV lasix and consult with medicine for admission- patient's family is in agreement.   13:17: CONSULT: Discussed with hospitalist Dr. Jamse Arn- accepts admission, requesting non contrast chest CT for further assessment which has been ordered. Appreciate consultation.   This is a shared visit with supervising physician Dr. Francia Greaves who has independently evaluated patient & provided guidance in evaluation/management/disposition, in agreement with care   Portions of this note were generated with Dragon dictation software. Dictation errors may occur despite best attempts at proofreading.  Final Clinical Impression(s) / ED Diagnoses Final diagnoses:  Hypoxic episode  Episode of altered cognition  Acute congestive heart failure, unspecified heart failure type ([place])    Rx /  DC Orders ED Discharge Orders    None       Leafy Kindle 05/26/20 1509    Valarie Merino, MD 05/30/20 1126

## 2020-05-26 NOTE — Progress Notes (Signed)
Attempted to obtain ABG x's 2 with doppler and was unsuccessful.

## 2020-05-27 ENCOUNTER — Encounter (HOSPITAL_COMMUNITY): Payer: Self-pay | Admitting: Internal Medicine

## 2020-05-27 DIAGNOSIS — I509 Heart failure, unspecified: Secondary | ICD-10-CM | POA: Diagnosis not present

## 2020-05-27 DIAGNOSIS — L8990 Pressure ulcer of unspecified site, unspecified stage: Secondary | ICD-10-CM | POA: Insufficient documentation

## 2020-05-27 DIAGNOSIS — E1165 Type 2 diabetes mellitus with hyperglycemia: Secondary | ICD-10-CM | POA: Diagnosis not present

## 2020-05-27 DIAGNOSIS — I482 Chronic atrial fibrillation, unspecified: Secondary | ICD-10-CM

## 2020-05-27 DIAGNOSIS — E1159 Type 2 diabetes mellitus with other circulatory complications: Secondary | ICD-10-CM

## 2020-05-27 DIAGNOSIS — I152 Hypertension secondary to endocrine disorders: Secondary | ICD-10-CM

## 2020-05-27 DIAGNOSIS — J9601 Acute respiratory failure with hypoxia: Secondary | ICD-10-CM | POA: Diagnosis not present

## 2020-05-27 LAB — GLUCOSE, CAPILLARY
Glucose-Capillary: 114 mg/dL — ABNORMAL HIGH (ref 70–99)
Glucose-Capillary: 152 mg/dL — ABNORMAL HIGH (ref 70–99)
Glucose-Capillary: 179 mg/dL — ABNORMAL HIGH (ref 70–99)

## 2020-05-27 LAB — CBC
HCT: 28.4 % — ABNORMAL LOW (ref 36.0–46.0)
Hemoglobin: 8 g/dL — ABNORMAL LOW (ref 12.0–15.0)
MCH: 26.9 pg (ref 26.0–34.0)
MCHC: 28.2 g/dL — ABNORMAL LOW (ref 30.0–36.0)
MCV: 95.6 fL (ref 80.0–100.0)
Platelets: 364 10*3/uL (ref 150–400)
RBC: 2.97 MIL/uL — ABNORMAL LOW (ref 3.87–5.11)
RDW: 19.9 % — ABNORMAL HIGH (ref 11.5–15.5)
WBC: 9.1 10*3/uL (ref 4.0–10.5)
nRBC: 0 % (ref 0.0–0.2)

## 2020-05-27 LAB — BASIC METABOLIC PANEL
Anion gap: 9 (ref 5–15)
BUN: 14 mg/dL (ref 8–23)
CO2: 32 mmol/L (ref 22–32)
Calcium: 8.4 mg/dL — ABNORMAL LOW (ref 8.9–10.3)
Chloride: 101 mmol/L (ref 98–111)
Creatinine, Ser: 1.13 mg/dL — ABNORMAL HIGH (ref 0.44–1.00)
GFR, Estimated: 48 mL/min — ABNORMAL LOW (ref >60)
Glucose, Bld: 102 mg/dL — ABNORMAL HIGH (ref 70–99)
Potassium: 4.5 mmol/L (ref 3.5–5.1)
Sodium: 142 mmol/L (ref 135–145)

## 2020-05-27 LAB — PROCALCITONIN: Procalcitonin: 0.16 ng/mL

## 2020-05-27 MED ORDER — SODIUM CHLORIDE 0.9 % IV SOLN
1.5000 g | Freq: Four times a day (QID) | INTRAVENOUS | Status: DC
  Administered 2020-05-27 – 2020-05-28 (×4): 1.5 g via INTRAVENOUS
  Filled 2020-05-27 (×7): qty 4

## 2020-05-27 MED ORDER — FUROSEMIDE 10 MG/ML IJ SOLN
40.0000 mg | Freq: Two times a day (BID) | INTRAMUSCULAR | Status: DC
  Administered 2020-05-27 – 2020-05-28 (×3): 40 mg via INTRAVENOUS
  Filled 2020-05-27 (×3): qty 4

## 2020-05-27 MED ORDER — INFLUENZA VAC A&B SA ADJ QUAD 0.5 ML IM PRSY
0.5000 mL | PREFILLED_SYRINGE | INTRAMUSCULAR | Status: AC
  Administered 2020-05-28: 0.5 mL via INTRAMUSCULAR
  Filled 2020-05-27: qty 0.5

## 2020-05-27 NOTE — Progress Notes (Signed)
[facility] PROGRESS NOTE    Progress Note  Caroleen Stoermer  HEN:[id number] DOB: November 09, 1945 DOA: 05/26/2020 PCP: [name], PA     Brief Narrative:   Crystal Ashley is an 74 y.o. female past medical history significant for atrial fibrillation on Xarelto history of uterine cancer status post hysterectomy, with poor functional status since pelvic fracture in 2021 was discharged to rehab on the day prior to admission on Omnicef for 3 days community-acquired pneumonia.  The patient has been weaned down to 1 L upon discharge apparently was found obtunded in acute respiratory failure in the mid 50s he was placed on 4 L EMS was called and transferred to the ED CT showed increased bilateral lower lobe groundglass opacity worse than prior and some worsening effusion and possible interstitial markings she was given IV Lasix  Assessment/Plan:   Acute respiratory failure with hypoxia multifactorial in the setting of HCAP and acute decompensated diastolic heart failure: Discontinue IV Zosyn and start IV Unasyn. Her BNP is 563, with a leukocytosis of 12 with a left shift.  Her baseline BNP is around 1 70-200. Check Pro-calcitonin. She appears fluid overloaded on physical exam with positive JVD and lower extremity edema. We will increase her Lasix to IV twice daily placed TED hose. Speech evaluated her prior to discharge recommended regular thin liquid diet.  T wave inversion anteriorly on EKG: The patient denies any chest pain cardiac biomarkers have remained flat likely demand ischemia in the setting of hypoxia.  Chronic Atrial fibrillation: Rate control on diltiazem and metoprolol continue current meds and Xarelto.  Uncontrolled diabetes mellitus type 2: Holding Metformin, agree with sliding scale insulin.  Hypothyroidism: Continue Synthroid.  Sacral decubitus ulcer stage I:  RN Pressure Injury Documentation: Pressure Injury 05/26/20 Coccyx Medial Stage 1 -  Intact skin  with non-blanchable redness of a localized area usually over a bony prominence. (Active)  05/26/20 2158  Location: Coccyx  Location Orientation: Medial  Staging: Stage 1 -  Intact skin with non-blanchable redness of a localized area usually over a bony prominence.  Wound Description (Comments):   Present on Admission: Yes    Estimated body mass index is 37.2 kg/m as calculated from the following:   Height as of this encounter: 5' (1.524 m).   Weight as of this encounter: 86.4 kg.   DVT prophylaxis: Xarelto Family Communication:none Status is: Inpatient  Remains inpatient appropriate because:Hemodynamically unstable   Dispo: The patient is from: Home              Anticipated d/c is to: Home              Anticipated d/c date is: 2 days              Patient currently is not medically stable to d/c.   Code Status:     Code Status Orders  (From admission, onward)         Start     Ordered   05/26/20 1753  Do not attempt resuscitation (DNR)  Continuous       Question Answer Comment  In the event of cardiac or respiratory ARREST Do not call a "code blue"   In the event of cardiac or respiratory ARREST Do not perform Intubation, CPR, defibrillation or ACLS   In the event of cardiac or respiratory ARREST Use medication by any route, position, wound care, and other measures to relive pain and suffering. May use oxygen, suction and manual treatment of airway obstruction  as needed for comfort.      05/26/20 1753        Code Status History    Date Active Date Inactive Code Status Order ID Comments User Context   05/26/2020 1753 05/26/2020 1753 DNR [id number]  Vashti Hey, MD ED   05/22/2020 1810 05/25/2020 1945 DNR [id number]  Nita Sells, MD ED   02/26/2020 1533 03/02/2020 2132 DNR [id number]  [name], MD Inpatient   02/12/2020 1756 02/26/2020 0622 DNR [id number]  Bary Leriche, PA-C Inpatient   02/12/2020 1756 02/12/2020 1756 DNR [id number]  Bary Leriche, PA-C Inpatient   02/07/2020 1004 02/12/2020 1751 DNR [id number]  Karmen Bongo, MD ED   10/19/2018 0009 10/21/2018 1355 Full Code [id number]  Ivor Costa, MD ED   Advance Care Planning Activity    Advance Directive Documentation     Most Recent Value  Type of Advance Directive Healthcare Power of Attorney  Pre-existing out of facility DNR order (yellow form or pink MOST form) --  "MOST" Form in Place? --        IV Access:    Peripheral IV   Procedures and diagnostic studies:   CT Head Wo Contrast  Result Date: 05/26/2020 CLINICAL DATA:  Altered mental status. EXAM: CT HEAD WITHOUT CONTRAST TECHNIQUE: Contiguous axial images were obtained from the base of the skull through the vertex without intravenous contrast. COMPARISON:  Brain CT February 21, 2020. FINDINGS: Brain: Ventricles and sulci are prominent compatible with atrophy. Periventricular and subcortical white matter hypodensities compatible with chronic microvascular ischemic changes. No evidence for acute cortically based infarct, intracranial hemorrhage, mass lesion or mass-effect. Vascular: Unremarkable Skull: Intact. Sinuses/Orbits: Fluid and mucosal thickening involving the sphenoid sinus. Remainder paranasal sinuses unremarkable. Mastoid air cells unremarkable. Orbits unremarkable. Other: None. IMPRESSION: 1. No acute intracranial process. 2. Atrophy and chronic microvascular ischemic changes. Electronically Signed   By: Lovey Newcomer M.D.   On: 05/26/2020 10:57   CT Chest Wo Contrast  Result Date: 05/26/2020 CLINICAL DATA:  Chest pain and shortness of breath. EXAM: CT CHEST WITHOUT CONTRAST TECHNIQUE: Multidetector CT imaging of the chest was performed following the standard protocol without IV contrast. COMPARISON:  Chest radiograph earlier same day; CT a chest 05/22/2020 FINDINGS: Cardiovascular: Normal heart size. Dilated pulmonary artery measuring 4.1 cm. No pericardial effusion. Mediastinum/Nodes: No axillary adenopathy.  Similar-appearing 1.3 cm prevascular node (image 17; series 4). Similar-appearing 10 mm right paratracheal node (image 17; series 4). Normal appearance of the esophagus. Lungs/Pleura: Expiratory phase imaging. Moderate bilateral pleural effusions, increased on the right. Bilateral lower lobe consolidative opacities likely atelectasis. Partial atelectasis involving the perifissural right middle lobe. Interval increase in bilateral upper lobe patchy ground-glass opacities. No pneumothorax. Upper Abdomen: No acute process. Musculoskeletal: Redemonstrated posterior fusion hardware involving the thoracic spine. No aggressive or acute appearing osseous lesions. IMPRESSION: 1. Moderate bilateral pleural effusions, increased on the right. Bilateral lower lobe consolidative opacities likely atelectasis. 2. Interval increase in bilateral upper lobe patchy ground-glass opacities which may represent atypical infectious process or inflammatory process. 3. Dilated pulmonary artery as can be seen with pulmonary arterial hypertension. 4. Similar-appearing mediastinal adenopathy, likely reactive in etiology. Electronically Signed   By: Lovey Newcomer M.D.   On: 05/26/2020 15:04   DG Chest Portable 1 View  Result Date: 05/26/2020 CLINICAL DATA:  Patient with hypoxia. EXAM: PORTABLE CHEST 1 VIEW COMPARISON:  Chest radiograph 05/23/2020. FINDINGS: Monitoring leads overlie the patient. Stable cardiac and mediastinal contours. Minimal bibasilar heterogeneous opacities. No pleural  effusion or pneumothorax. Thoracic spinal fusion hardware. IMPRESSION: Bibasilar opacities favored to represent atelectasis. Electronically Signed   By: Lovey Newcomer M.D.   On: 05/26/2020 11:01     Medical Consultants:    None.  Anti-Infectives:   Augmentin  Subjective:    [place] she relates her breathing is about the same, she relates some orthopnea.  Objective:    Vitals:   05/27/20 0004 05/27/20 0007 05/27/20 0350 05/27/20  0400  BP: 102/79  105/65 (!) 95/50  Pulse: 85  97   Resp: 13  18 14   Temp: (!) 97.5 F (36.4 C)  97.8 F (36.6 C)   TempSrc: Oral  Oral   SpO2: 92%  100% 100%  Weight:  86.4 kg    Height:       SpO2: 100 % O2 Flow Rate (L/min): 3 L/min   Intake/Output Summary (Last 24 hours) at 05/27/2020 0759 Last data filed at 05/27/2020 0400 Gross per 24 hour  Intake 52.3 ml  Output 1575 ml  Net -1522.7 ml   Filed Weights   05/26/20 1735 05/26/20 1849 05/27/20 0007  Weight: 88.1 kg 85.9 kg 86.4 kg    Exam: General exam: In no acute distress. Respiratory system: Good air movement with diffuse crackles at bases bilaterally. Cardiovascular system: S1 & S2 heard, RRR.+ JVD Gastrointestinal system: Abdomen is nondistended, soft and nontender.  Extremities: No pedal edema. Skin: No rashes, lesions or ulcers  Data Reviewed:    Labs: Basic Metabolic Panel: Recent Labs  Lab 05/22/20 1128 05/22/20 1128 05/22/20 2022 05/23/20 0748 05/23/20 0748 05/25/20 0212 05/25/20 0212 05/26/20 1027 05/26/20 1142  NA 140  --   --  142  --  141  --  140 141  K 3.0*   < >  --  3.8   < > 4.3   < > 4.5 4.8  CL 95*  --   --  100  --  102  --   --  104  CO2 34*  --   --  33*  --  31  --   --  26  GLUCOSE 199*  --   --  128*  --  135*  --   --  122*  BUN 10  --   --  10  --  11  --   --  13  CREATININE 0.97  --   --  0.94  --  0.94  --   --  1.03*  CALCIUM 8.6*  --   --  8.4*  --  8.4*  --   --  8.5*  MG  --   --  2.1  --   --   --   --   --   --    < > = values in this interval not displayed.   GFR Estimated Creatinine Clearance: 46.8 mL/min (A) (by C-G formula based on SCr of 1.03 mg/dL (H)). Liver Function Tests: Recent Labs  Lab 05/22/20 1128 05/23/20 0748 05/25/20 0212 05/26/20 1142  AST 17 13* 21 22  ALT 11 9 7 12   ALKPHOS 88 74 70 76  BILITOT 0.6 0.4 0.7 0.5  PROT 6.4* 5.6* 5.9* 6.3*  ALBUMIN 2.5* 2.1* 2.2* 2.5*   No results for input(s): LIPASE, AMYLASE in the last 168  hours. No results for input(s): AMMONIA in the last 168 hours. Coagulation profile No results for input(s): INR, PROTIME in the last 168 hours. COVID-19 Labs  No results for input(s): DDIMER,  FERRITIN, LDH, CRP in the last 72 hours.  Lab Results  Component Value Date   SARSCOV2NAA NEGATIVE 05/26/2020   [place] NEGATIVE 05/24/2020   [place] NEGATIVE 05/22/2020   [place] NEGATIVE 03/01/2020    CBC: Recent Labs  Lab 05/22/20 1128 05/22/20 1128 05/23/20 0748 05/25/20 0212 05/26/20 1027 05/26/20 1142 05/27/20 0716  WBC 12.5*  --  7.3 9.3  --  12.3* 9.1  NEUTROABS 10.4*  --   --  5.9  --  9.7*  --   HGB 9.9*   < > 8.5* 8.3* 9.5* 8.9* 8.0*  HCT 33.1*   < > 30.2* 29.9* 28.0* 32.9* 28.4*  MCV 91.7  --  94.7 94.6  --  98.2 95.6  PLT 382  --  337 359  --  383 364   < > = values in this interval not displayed.   Cardiac Enzymes: No results for input(s): CKTOTAL, CKMB, CKMBINDEX, TROPONINI in the last 168 hours. BNP (last 3 results) No results for input(s): PROBNP in the last 8760 hours. CBG: Recent Labs  Lab 05/23/20 1129 05/23/20 1624 05/24/20 0618 05/26/20 2148 05/27/20 0541  GLUCAP 178* 112* 94 135* 114*   D-Dimer: No results for input(s): DDIMER in the last 72 hours. Hgb A1c: Recent Labs    05/26/20 2017  HGBA1C 6.1*   Lipid Profile: No results for input(s): CHOL, HDL, LDLCALC, TRIG, CHOLHDL, LDLDIRECT in the last 72 hours. Thyroid function studies: Recent Labs    05/26/20 1142  TSH 5.001*   Anemia work up: No results for input(s): VITAMINB12, FOLATE, FERRITIN, TIBC, IRON, RETICCTPCT in the last 72 hours. Sepsis Labs: Recent Labs  Lab 05/23/20 0748 05/25/20 0212 05/26/20 1142 05/27/20 0716  WBC 7.3 9.3 12.3* 9.1  LATICACIDVEN  --   --  1.5  --    Microbiology Recent Results (from the past 240 hour(s))  Respiratory Panel by RT PCR (Flu A&B, Covid) - Nasopharyngeal Swab     Status: None   Collection Time: 05/22/20 10:49 AM   Specimen:  Nasopharyngeal Swab  Result Value Ref Range Status   SARS Coronavirus 2 by RT PCR NEGATIVE NEGATIVE Final    Comment: (NOTE) SARS-CoV-2 target nucleic acids are NOT DETECTED.  The SARS-CoV-2 RNA is generally detectable in upper respiratoy specimens during the acute phase of infection. The lowest concentration of SARS-CoV-2 viral copies this assay can detect is 131 copies/mL. A negative result does not preclude SARS-Cov-2 infection and should not be used as the sole basis for treatment or other patient management decisions. A negative result may occur with  improper specimen collection/handling, submission of specimen other than nasopharyngeal swab, presence of viral mutation(s) within the areas targeted by this assay, and inadequate number of viral copies (<131 copies/mL). A negative result must be combined with clinical observations, patient history, and epidemiological information. The expected result is Negative.  Fact Sheet for Patients:  PinkCheek.be  Fact Sheet for Healthcare Providers:  GravelBags.it  This test is no t yet approved or cleared by the [place] FDA and  has been authorized for detection and/or diagnosis of SARS-CoV-2 by FDA under an Emergency Use Authorization (EUA). This EUA will remain  in effect (meaning this test can be used) for the duration of the COVID-19 declaration under Section 564(b)(1) of the Act, 21 U.S.C. section 360bbb-3(b)(1), unless the authorization is terminated or revoked sooner.     Influenza A by PCR NEGATIVE NEGATIVE Final   Influenza B by PCR NEGATIVE NEGATIVE Final  Comment: (NOTE) The Xpert Xpress SARS-CoV-2/FLU/RSV assay is intended as an aid in  the diagnosis of influenza from Nasopharyngeal swab specimens and  should not be used as a sole basis for treatment. Nasal washings and  aspirates are unacceptable for Xpert Xpress SARS-CoV-2/FLU/RSV  testing.  Fact Sheet  for Patients: PinkCheek.be  Fact Sheet for Healthcare Providers: GravelBags.it  This test is not yet approved or cleared by the [place] FDA and  has been authorized for detection and/or diagnosis of SARS-CoV-2 by  FDA under an Emergency Use Authorization (EUA). This EUA will remain  in effect (meaning this test can be used) for the duration of the  Covid-19 declaration under Section 564(b)(1) of the Act, 21  U.S.C. section 360bbb-3(b)(1), unless the authorization is  terminated or revoked. Performed at [facility], [place] [address] [place], [place] [address]   SARS Coronavirus 2 by RT PCR (hospital order, performed in [facility] hospital lab) Nasopharyngeal Nasopharyngeal Swab     Status: None   Collection Time: 05/24/20 10:04 AM   Specimen: Nasopharyngeal Swab  Result Value Ref Range Status   SARS Coronavirus 2 NEGATIVE NEGATIVE Final    Comment: (NOTE) SARS-CoV-2 target nucleic acids are NOT DETECTED.  The SARS-CoV-2 RNA is generally detectable in upper and lower respiratory specimens during the acute phase of infection. The lowest concentration of SARS-CoV-2 viral copies this assay can detect is 250 copies / mL. A negative result does not preclude SARS-CoV-2 infection and should not be used as the sole basis for treatment or other patient management decisions.  A negative result may occur with improper specimen collection / handling, submission of specimen other than nasopharyngeal swab, presence of viral mutation(s) within the areas targeted by this assay, and inadequate number of viral copies (<250 copies / mL). A negative result must be combined with clinical observations, patient history, and epidemiological information.  Fact Sheet for Patients:   StrictlyIdeas.no  Fact Sheet for Healthcare Providers: BankingDealers.co.za  This test is not yet approved  or  cleared by the [place] FDA and has been authorized for detection and/or diagnosis of SARS-CoV-2 by FDA under an Emergency Use Authorization (EUA).  This EUA will remain in effect (meaning this test can be used) for the duration of the COVID-19 declaration under Section 564(b)(1) of the Act, 21 U.S.C. section 360bbb-3(b)(1), unless the authorization is terminated or revoked sooner.  Performed at [facility], [place] [address], [place], [place] [address]   Respiratory Panel by RT PCR (Flu A&B, Covid) - Nasopharyngeal Swab     Status: None   Collection Time: 05/26/20 10:24 AM   Specimen: Nasopharyngeal Swab  Result Value Ref Range Status   SARS Coronavirus 2 by RT PCR NEGATIVE NEGATIVE Final    Comment: (NOTE) SARS-CoV-2 target nucleic acids are NOT DETECTED.  The SARS-CoV-2 RNA is generally detectable in upper respiratoy specimens during the acute phase of infection. The lowest concentration of SARS-CoV-2 viral copies this assay can detect is 131 copies/mL. A negative result does not preclude SARS-Cov-2 infection and should not be used as the sole basis for treatment or other patient management decisions. A negative result may occur with  improper specimen collection/handling, submission of specimen other than nasopharyngeal swab, presence of viral mutation(s) within the areas targeted by this assay, and inadequate number of viral copies (<131 copies/mL). A negative result must be combined with clinical observations, patient history, and epidemiological information. The expected result is Negative.  Fact Sheet for Patients:  PinkCheek.be  Fact Sheet for Healthcare Providers:  GravelBags.it  This test is no t yet approved or cleared by the [place] FDA and  has been authorized for detection and/or diagnosis of SARS-CoV-2 by FDA under an Emergency Use Authorization (EUA). This EUA will remain  in effect  (meaning this test can be used) for the duration of the COVID-19 declaration under Section 564(b)(1) of the Act, 21 U.S.C. section 360bbb-3(b)(1), unless the authorization is terminated or revoked sooner.     Influenza A by PCR NEGATIVE NEGATIVE Final   Influenza B by PCR NEGATIVE NEGATIVE Final    Comment: (NOTE) The Xpert Xpress SARS-CoV-2/FLU/RSV assay is intended as an aid in  the diagnosis of influenza from Nasopharyngeal swab specimens and  should not be used as a sole basis for treatment. Nasal washings and  aspirates are unacceptable for Xpert Xpress SARS-CoV-2/FLU/RSV  testing.  Fact Sheet for Patients: PinkCheek.be  Fact Sheet for Healthcare Providers: GravelBags.it  This test is not yet approved or cleared by the [place] FDA and  has been authorized for detection and/or diagnosis of SARS-CoV-2 by  FDA under an Emergency Use Authorization (EUA). This EUA will remain  in effect (meaning this test can be used) for the duration of the  Covid-19 declaration under Section 564(b)(1) of the Act, 21  U.S.C. section 360bbb-3(b)(1), unless the authorization is  terminated or revoked. Performed at [facility], [place] [address] [place], [place] [address]   Blood culture (routine x 2)     Status: None (Preliminary result)   Collection Time: 05/26/20 11:57 AM   Specimen: BLOOD LEFT HAND  Result Value Ref Range Status   Specimen Description BLOOD LEFT HAND  Final   Special Requests   Final    BOTTLES DRAWN AEROBIC AND ANAEROBIC Blood Culture results may not be optimal due to an inadequate volume of blood received in culture bottles   Culture   Final    NO GROWTH < 24 HOURS Performed at [facility], [place] [address], [place], [place] [address]    Report Status PENDING  Incomplete  Blood culture (routine x 2)     Status: None (Preliminary result)   Collection Time: 05/26/20  8:28 PM   Specimen: BLOOD    Result Value Ref Range Status   Specimen Description BLOOD SITE NOT SPECIFIED  Final   Special Requests   Final    BOTTLES DRAWN AEROBIC ONLY Blood Culture results may not be optimal due to an inadequate volume of blood received in culture bottles   Culture   Final    NO GROWTH < 12 HOURS Performed at [facility], [place] [address], [place], [place] [address]    Report Status PENDING  Incomplete     Medications:   . atorvastatin  10 mg Oral QPM  . diltiazem  180 mg Oral Daily  . dorzolamide-timolol  1 drop Both Eyes BID  . feeding supplement (ENSURE ENLIVE)  237 mL Oral BID BM  . furosemide  40 mg Oral Daily  . insulin aspart  0-20 Units Subcutaneous TID WC  . latanoprost  1 drop Both Eyes QHS  . levothyroxine  75 mcg Oral QAC breakfast  . lidocaine  1 patch Transdermal Q24H  . metoprolol succinate  100 mg Oral QPC breakfast  . Muscle Rub  1 application Topical Daily  . pantoprazole  40 mg Oral BID  . potassium chloride  10 mEq Oral Daily  . rivaroxaban  20 mg Oral Q supper  . senna  2 tablet Oral BID   Continuous Infusions: . piperacillin-tazobactam (ZOSYN)  IV 12.5 mL/hr at 05/27/20 0400      LOS: 1 day   Charlynne Cousins  [facility]  05/27/2020, 7:59 AM

## 2020-05-27 NOTE — Progress Notes (Signed)
Yellow mews with current tx of infection with ABT IVPB patient tachy HR teens to 63 MD notified. No distress noted with patient.   05/27/20 1110  Vitals  Temp 97.8 F (36.6 C)  Temp Source Oral  BP 100/60  Pulse Rate 100  ECG Heart Rate (!) 102  Resp 20  Level of Consciousness  Level of Consciousness Alert  MEWS COLOR  MEWS Score Color Yellow  Oxygen Therapy  SpO2 100 %  Pain Assessment  Pain Scale 0-10  Pain Score 0  PCA/Epidural/Spinal Assessment  Respiratory Pattern Regular  Glasgow Coma Scale  Eye Opening 4  Best Verbal Response (NON-intubated) 5  Modified Verbal Response (INTUBATED) 5  Best Motor Response 6  Glasgow Coma Scale Score (!) 20  MEWS Score  MEWS Temp 0  MEWS Systolic 1  MEWS Pulse 1  MEWS RR 0  MEWS LOC 0  MEWS Score 2  Provider Notification  Provider Name/Title Dr.Ortiz  Date Provider Notified 05/27/20  Time Provider Notified 1219  Notification Type Page  Notification Reason Other (Comment) (yellow mews due to tachycardia hr teens to 124.)  Response  (no new orders patient being treated with ABT's.)  Date of Provider Response 05/27/20  Time of Provider Response 1220  Rapid Response Notification  Name of Rapid Response RN Notified  (not this time patient stable,has been tachycardic.)  Note  Observations  (continue supportive tx for infection in hopes hr will decrea)

## 2020-05-27 NOTE — Progress Notes (Signed)
Occupational Therapy Evaluation Patient Details Name: Crystal Ashley MRN: [id number] DOB: 1946-06-01 Today's Date: 05/27/2020    History of Present Illness 74 yo female admitted to [facility] on 10/6 from [place] with PNA. PMH includes afib, uterine cancer, hypothyroidism, fall with pelvic fracture and T10/11 fx s/p fusion 01/2020.   Clinical Impression   Patient is familiar to rehab from previous admissions. Patient currently functioning below baseline level requiring +2 assist for bed mobility, toileting/hygiene/clothing management at bed level and functional transfers. Patient also limited by decreased activity tolerance, generalized deconditioning, and increased coaxing/encouragement for participation with OOB activity. Unable to progress beyond bed level activity this date 2/2 abdominal discomfort and fatigue. Patient would benefit from continued acute OT services to maximize safety and independence with self-care tasks in prep for safe d/c to next level of care.     Follow Up Recommendations  SNF    Equipment Recommendations  None recommended by OT    Recommendations for Other Services       Precautions / Restrictions Precautions Precautions: Fall Precaution Comments: watch sats/HR Restrictions Weight Bearing Restrictions: No      Mobility Bed Mobility Overal bed mobility: Needs Assistance Bed Mobility: Rolling Rolling: Mod assist;+2 for physical assistance         General bed mobility comments: MaxAx2 for rolling side to side, increaesd to totalAX2 as fatigue increaesd and paitent did not initiate as much. totalA for pericare.  Transfers Overall transfer level: Needs assistance Equipment used: Rolling walker (2 wheeled);2 person hand held assist Transfers: Sit to/from [facility] Sit to Stand: Max assist;+2 physical assistance;From elevated surface Stand pivot transfers: Max assist;+2 physical assistance;From elevated surface       General transfer  comment: refused    Balance Overall balance assessment: History of Falls Sitting-balance support: Feet supported Sitting balance-Leahy Scale: Poor Sitting balance - Comments: periods of supervision level of assist sitting EOB, posterior leaning with fatigue requiring min assist to correct   Standing balance support: During functional activity   Standing balance comment: max +2 for stand and transfer                           ADL either performed or assessed with clinical judgement   ADL Overall ADL's : Needs assistance/impaired     Grooming: Wash/dry face;Set up;Sitting;Supervision/safety;Cueing for sequencing Grooming Details (indicate cue type and reason): min verbal cueing needed due to slow processing and decreased initiation             Lower Body Dressing: Total assistance;Bed level Lower Body Dressing Details (indicate cue type and reason): required total assist to don socks. pt's son reports staff at [facility] dresses and bathes her 2-3 times a week Toilet Transfer: +2 for physical assistance;Maximal assistance;Stand-pivot Toilet Transfer Details (indicate cue type and reason): simulated in room to chair. see transfers below for more details Toileting- Clothing Manipulation and Hygiene: Total assistance;Bed level;+2 for physical assistance Toileting - Clothing Manipulation Details (indicate cue type and reason): mod assist +2 for physical assistance for rolling in bed. pt had BM in bed and total assist for peri care     Functional mobility during ADLs:  (unable to take steps with RW max assist +2 ) General ADL Comments: Unable to progress beyond bed level this date 2/2 fatigue and stomach discomfort.      Vision Baseline Vision/History: Wears glasses Wears Glasses: Reading only Patient Visual Report: Other (comment) (Difficult to assess 2/2 decreased cognition. )  Perception     Praxis      Pertinent Vitals/Pain Pain Assessment: Faces Faces Pain  Scale: Hurts little more Pain Location: LEs and buttocks, during pericare Pain Descriptors / Indicators: Grimacing;Moaning Pain Intervention(s): Limited activity within patient's tolerance;Monitored during session;Repositioned     Hand Dominance Right   Extremity/Trunk Assessment Upper Extremity Assessment Upper Extremity Assessment: Generalized weakness   Lower Extremity Assessment Lower Extremity Assessment: Defer to PT evaluation   Cervical / Trunk Assessment Cervical / Trunk Assessment: Kyphotic   Communication Communication Communication: Prefers language other than English;Other (comment) (speaks some english, first language is Hindi (son in room re)   Cognition Arousal/Alertness: Awake/alert Behavior During Therapy: Flat affect Overall Cognitive Status: Impaired/Different from baseline Area of Impairment: Following commands;Problem solving;Orientation;Attention;Memory;Safety/judgement;Awareness                 Orientation Level: Disoriented to;Time;Situation Current Attention Level: Sustained   Following Commands: Follows one step commands with increased time Safety/Judgement: Decreased awareness of safety;Decreased awareness of deficits Awareness: Intellectual Problem Solving: Slow processing;Difficulty sequencing;Requires tactile cues;Requires verbal cues;Decreased initiation General Comments: Unsure of pt baseline, follows commands well with increased time and at times repeated multimodal cuing. fear of falling when sitting EOB.   General Comments  refused EOB/OOB activity today, HR up to 124BPM but SPO2 stable on 3LPM    Exercises     Shoulder Instructions      Home Living Family/patient expects to be discharged to:: Skilled nursing facility Living Arrangements: Children                               Additional Comments: [facility]      Prior Functioning/Environment Level of Independence: Needs assistance  Gait / Transfers  Assistance Needed: Per pt's son, pt ambulatory with RW for short distances (5-10 steps) ADL's / Homemaking Assistance Needed: Pt reports assist for dressing, bathing, pericare.   Comments: Dressing/bathing self. prior to fall, did not walk much but independent with bed mobility, transfers and short household distances, Pt recently in CIR from 6-28-7/12 and mainly tolerated bed level exercises due to pain.        OT Problem List: Decreased strength;Decreased activity tolerance;Impaired balance (sitting and/or standing);Decreased safety awareness;Decreased knowledge of use of DME or AE;Decreased knowledge of precautions      OT Treatment/Interventions: Self-care/ADL training;Therapeutic exercise;DME and/or AE instruction;Therapeutic activities;Patient/family education    OT Goals(Current goals can be found in the care plan section) Acute Rehab OT Goals Patient Stated Goal: back to [place], then home OT Goal Formulation: With patient/family Time For Goal Achievement: 06/06/20 Potential to Achieve Goals: Fair ADL Goals Pt Will Perform Grooming: with set-up;sitting Pt Will Perform Upper Body Dressing: with set-up;sitting Pt Will Perform Lower Body Dressing: with min assist;with adaptive equipment;sitting/lateral leans Pt Will Transfer to Toilet: with min assist;stand pivot transfer;bedside commode Pt Will Perform Toileting - Clothing Manipulation and hygiene: with min assist;sitting/lateral leans  OT Frequency: Min 2X/week   Barriers to D/C:            Co-evaluation PT/OT/SLP Co-Evaluation/Treatment: Yes Reason for Co-Treatment: Complexity of the patient's impairments (multi-system involvement);For patient/therapist safety PT goals addressed during session: Mobility/safety with mobility;Strengthening/ROM        AM-PAC OT "6 Clicks" Daily Activity     Outcome Measure Help from another person eating meals?: A Little Help from another person taking care of personal grooming?: A  Lot Help from another person toileting, which includes using  toliet, bedpan, or urinal?: Total Help from another person bathing (including washing, rinsing, drying)?: Total Help from another person to put on and taking off regular upper body clothing?: A Lot Help from another person to put on and taking off regular lower body clothing?: Total 6 Click Score: 10   End of Session Equipment Utilized During Treatment: Gait belt;Rolling walker;Oxygen Nurse Communication: Mobility status;Other (comment) (RN made aware of sores between buttocks)  Activity Tolerance: Patient limited by fatigue Patient left: with call bell/phone within reach;with family/visitor present;in bed;with bed alarm set  OT Visit Diagnosis: Muscle weakness (generalized) (M62.81);Unsteadiness on feet (R26.81);Other abnormalities of gait and mobility (R26.89);History of falling (Z91.81)                Time: 1552-0802 OT Time Calculation (min): 51 min Charges:  OT General Charges $OT Visit: 1 Visit OT Treatments $Self Care/Home Management : 8-22 mins  Summer Parthasarathy H. OTR/L Supplemental OT, Department of rehab services [phone]  Patience Nuzzo R H. 05/27/2020, 3:40 PM

## 2020-05-27 NOTE — Plan of Care (Signed)

## 2020-05-27 NOTE — Evaluation (Signed)
Physical Therapy Evaluation Patient Details Name: Crystal Ashley MRN: [id number] DOB: [birth year] Today's Date: 05/27/2020   History of Present Illness  74yo female found in obtunded state at her SNF with SPO2 in the 50s, needed 4lpm to recover back to 90s. Admitted with acute respiratory failure in setting of HCAP, acute decompensated heart failure. PMH HTN, DM, cataracts, A-fib, CA, lumbar surgery, hx cardioversion  Clinical Impression   Patient received in bed, pleasant and cooperative with therapy; note slow processing and some confusion present during this session. Noted she had had incontinent BM and performed pericare with totalA, however right after we finished the first round of pericare she then urinated and had another liquid BM. At first needed maxA for rolling but this increased to [place] as fatigue increased/initiation decreased. Refused EOB/OOB today. Left positioned to comfort in bed with all needs met, bed alarm active and son present. Will need to return to SNF prior to return home.     Follow Up Recommendations SNF;Supervision/Assistance - 24 hour    Equipment Recommendations  None recommended by PT (defer to next venue)    Recommendations for Other Services       Precautions / Restrictions Precautions Precautions: Fall Precaution Comments: watch sats/HR Restrictions Weight Bearing Restrictions: No      Mobility  Bed Mobility Overal bed mobility: Needs Assistance Bed Mobility: Rolling Rolling: Max assist;+2 for physical assistance         General bed mobility comments: MaxAx2 for rolling side to side, increaesd to totalAX2 as fatigue increaesd and paitent did not initiate as much. totalA for pericare.  Transfers Overall transfer level: Needs assistance Equipment used: Rolling walker (2 wheeled);2 person hand held assist Transfers: Sit to/from [facility] Sit to Stand: Max assist;+2 physical assistance;From elevated surface Stand pivot  transfers: Max assist;+2 physical assistance;From elevated surface       General transfer comment: refused  Ambulation/Gait             General Gait Details: refused  Stairs            Wheelchair Mobility    Modified Rankin (Stroke Patients Only)       Balance Overall balance assessment: History of Falls Sitting-balance support: Feet supported Sitting balance-Leahy Scale: Poor Sitting balance - Comments: periods of supervision level of assist sitting EOB, posterior leaning with fatigue requiring min assist to correct   Standing balance support: During functional activity   Standing balance comment: max +2 for stand and transfer                             Pertinent Vitals/Pain Pain Assessment: Faces Faces Pain Scale: Hurts little more Pain Location: LEs and buttocks, during pericare Pain Descriptors / Indicators: Grimacing;Moaning Pain Intervention(s): Limited activity within patient's tolerance;Monitored during session;Repositioned    Home Living Family/patient expects to be discharged to:: Skilled nursing facility Living Arrangements: Children               Additional Comments: to [facility] for additional rehab and then home from there    Prior Function Level of Independence: Needs assistance   Gait / Transfers Assistance Needed: Per pt's son, pt ambulatory with RW for short distances (5-10 steps)  ADL's / Homemaking Assistance Needed: Pt reports assist for dressing, bathing, pericare.  Comments: Dressing/bathing self. prior to fall, did not walk much but independent with bed mobility, transfers and short household distances, Pt recently in CIR from 6-28-7/12  and mainly tolerated bed level exercises due to pain.     Hand Dominance   Dominant Hand: Right    Extremity/Trunk Assessment   Upper Extremity Assessment Upper Extremity Assessment: Defer to OT evaluation    Lower Extremity Assessment Lower Extremity Assessment:  Generalized weakness    Cervical / Trunk Assessment Cervical / Trunk Assessment: Kyphotic  Communication   Communication: Prefers language other than [place];Other (comment) (first lanuage is Hindi)  Cognition Arousal/Alertness: Awake/alert Behavior During Therapy: Flat affect Overall Cognitive Status: Impaired/Different from baseline Area of Impairment: Orientation;Attention;Memory;Following commands;Safety/judgement;Problem solving;Awareness                 Orientation Level: Disoriented to;Time;Situation Current Attention Level: Sustained   Following Commands: Follows one step commands consistently;Follows one step commands with increased time Safety/Judgement: Decreased awareness of safety;Decreased awareness of deficits Awareness: Intellectual Problem Solving: Slow processing;Difficulty sequencing;Requires tactile cues;Requires verbal cues;Decreased initiation General Comments: Unsure of pt baseline, follows commands well with increased time and at times repeated multimodal cuing. fear of falling when rolled on side at EOB. Very slow processing.      General Comments General comments (skin integrity, edema, etc.): refused EOB/OOB activity today, HR up to 124BPM but SPO2 stable on 3LPM    Exercises     Assessment/Plan    PT Assessment Patient needs continued PT services  PT Problem List Decreased strength;Decreased mobility;Decreased activity tolerance;Decreased balance;Decreased knowledge of use of DME;Pain;Decreased range of motion;Decreased safety awareness;Obesity;Decreased cognition;Cardiopulmonary status limiting activity;Decreased coordination       PT Treatment Interventions DME instruction;Therapeutic activities;Gait training;Therapeutic exercise;Patient/family education;Balance training;Stair training;Functional mobility training;Neuromuscular re-education    PT Goals (Current goals can be found in the Care Plan section)  Acute Rehab PT Goals Patient Stated  Goal: back to [place], then home PT Goal Formulation: With patient Time For Goal Achievement: 06/10/20 Potential to Achieve Goals: Fair    Frequency Min 2X/week   Barriers to discharge        Co-evaluation PT/OT/SLP Co-Evaluation/Treatment: Yes Reason for Co-Treatment: Complexity of the patient's impairments (multi-system involvement);For patient/therapist safety;To address functional/ADL transfers;Necessary to address cognition/behavior during functional activity PT goals addressed during session: Mobility/safety with mobility;Strengthening/ROM         AM-PAC PT "6 Clicks" Mobility  Outcome Measure Help needed turning from your back to your side while in a flat bed without using bedrails?: A Lot Help needed moving from lying on your back to sitting on the side of a flat bed without using bedrails?: Total Help needed moving to and from a bed to a chair (including a wheelchair)?: Total Help needed standing up from a chair using your arms (e.g., wheelchair or bedside chair)?: Total Help needed to walk in hospital room?: Total Help needed climbing 3-5 steps with a railing? : Total 6 Click Score: 7    End of Session Equipment Utilized During Treatment: Oxygen Activity Tolerance: Patient limited by fatigue Patient left: in bed;with call bell/phone within reach;with bed alarm set;with family/visitor present Nurse Communication: Mobility status;Need for lift equipment;Other (comment) (purewick canister full) PT Visit Diagnosis: Other abnormalities of gait and mobility (R26.89);Unsteadiness on feet (R26.81);Muscle weakness (generalized) (M62.81)    Time: 0086-7619 PT Time Calculation (min) (ACUTE ONLY): 51 min   Charges:   PT Evaluation $PT Eval Moderate Complexity: 1 Mod (co-eval with OT) PT Treatments $Therapeutic Activity: 8-22 mins        Windell Norfolk, DPT, PN1   Supplemental Physical Therapist [place]    Pager [phone] Acute Rehab Office  [phone]

## 2020-05-28 DIAGNOSIS — I482 Chronic atrial fibrillation, unspecified: Secondary | ICD-10-CM | POA: Diagnosis not present

## 2020-05-28 DIAGNOSIS — J9601 Acute respiratory failure with hypoxia: Secondary | ICD-10-CM | POA: Diagnosis not present

## 2020-05-28 DIAGNOSIS — E1165 Type 2 diabetes mellitus with hyperglycemia: Secondary | ICD-10-CM | POA: Diagnosis not present

## 2020-05-28 DIAGNOSIS — I509 Heart failure, unspecified: Secondary | ICD-10-CM | POA: Diagnosis not present

## 2020-05-28 LAB — BASIC METABOLIC PANEL
Anion gap: 11 (ref 5–15)
BUN: 12 mg/dL (ref 8–23)
CO2: 35 mmol/L — ABNORMAL HIGH (ref 22–32)
Calcium: 8.2 mg/dL — ABNORMAL LOW (ref 8.9–10.3)
Chloride: 97 mmol/L — ABNORMAL LOW (ref 98–111)
Creatinine, Ser: 1.1 mg/dL — ABNORMAL HIGH (ref 0.44–1.00)
GFR, Estimated: 49 mL/min — ABNORMAL LOW (ref >60)
Glucose, Bld: 131 mg/dL — ABNORMAL HIGH (ref 70–99)
Potassium: 3.3 mmol/L — ABNORMAL LOW (ref 3.5–5.1)
Sodium: 143 mmol/L (ref 135–145)

## 2020-05-28 LAB — PROCALCITONIN: Procalcitonin: 0.12 ng/mL

## 2020-05-28 LAB — GLUCOSE, CAPILLARY
Glucose-Capillary: 127 mg/dL — ABNORMAL HIGH (ref 70–99)
Glucose-Capillary: 194 mg/dL — ABNORMAL HIGH (ref 70–99)
Glucose-Capillary: 185 mg/dL — ABNORMAL HIGH (ref 70–99)
Glucose-Capillary: 163 mg/dL — ABNORMAL HIGH (ref 70–99)

## 2020-05-28 MED ORDER — SODIUM CHLORIDE 0.9 % IV SOLN
INTRAVENOUS | Status: DC | PRN
  Administered 2020-05-28: 250 mL via INTRAVENOUS

## 2020-05-28 MED ORDER — FUROSEMIDE 40 MG PO TABS
40.0000 mg | ORAL_TABLET | Freq: Every day | ORAL | Status: DC
  Administered 2020-05-29: 40 mg via ORAL
  Filled 2020-05-28: qty 1

## 2020-05-28 NOTE — Progress Notes (Signed)
[facility] PROGRESS NOTE    Progress Note  Crystal Ashley  OZH:[id number] DOB: February 12, 1946 DOA: 05/26/2020 PCP: [name], PA     Brief Narrative:   Crystal Ashley is an 74 y.o. female past medical history significant for atrial fibrillation on Xarelto history of uterine cancer status post hysterectomy, with poor functional status since pelvic fracture in 2021 was discharged to rehab on the day prior to admission on Omnicef for 3 days community-acquired pneumonia.  The patient has been weaned down to 1 L upon discharge apparently was found obtunded in acute respiratory failure in the mid 50s he was placed on 4 L EMS was called and transferred to the ED CT showed increased bilateral lower lobe groundglass opacity worse than prior and some worsening effusion and possible interstitial markings she was given IV Lasix  Assessment/Plan:   Acute respiratory failure with hypoxia /acute decompensated diastolic heart failure: Procalcitonin is low yield discontinue IV empiric antibiotics. Due to chest x-ray showing bilateral infiltrates with elevated BNP and fluid overload inferior physical exam she was started on IV Lasix he has brisk diuresis of about 3 L negative today. Continue regular thin liquid diet. Physical therapy evaluated the patient and recommended skilled nursing facility.  T wave inversion anteriorly on EKG: The patient denies any chest pain cardiac biomarkers have remained flat likely demand ischemia in the setting of hypoxia.  Chronic Atrial fibrillation: Rate control on diltiazem and metoprolol continue current meds and Xarelto.  Uncontrolled diabetes mellitus type 2: Holding Metformin, agree with sliding scale insulin.  Hypothyroidism: Continue Synthroid.  Sacral decubitus ulcer stage I:  RN Pressure Injury Documentation: Pressure Injury 05/26/20 Coccyx Medial Stage 1 -  Intact skin with non-blanchable redness of a localized area usually over a bony  prominence. (Active)  05/26/20 2158  Location: Coccyx  Location Orientation: Medial  Staging: Stage 1 -  Intact skin with non-blanchable redness of a localized area usually over a bony prominence.  Wound Description (Comments):   Present on Admission: Yes    Estimated body mass index is 36.55 kg/m as calculated from the following:   Height as of this encounter: 5' (1.524 m).   Weight as of this encounter: 84.9 kg.   DVT prophylaxis: Xarelto Family Communication:none Status is: Inpatient  Remains inpatient appropriate because:Hemodynamically unstable   Dispo: The patient is from: Home              Anticipated d/c is to: Home              Anticipated d/c date is: 2 days              Patient currently is not medically stable to d/c.   Code Status:     Code Status Orders  (From admission, onward)         Start     Ordered   05/26/20 1753  Do not attempt resuscitation (DNR)  Continuous       Question Answer Comment  In the event of cardiac or respiratory ARREST Do not call a "code blue"   In the event of cardiac or respiratory ARREST Do not perform Intubation, CPR, defibrillation or ACLS   In the event of cardiac or respiratory ARREST Use medication by any route, position, wound care, and other measures to relive pain and suffering. May use oxygen, suction and manual treatment of airway obstruction as needed for comfort.      05/26/20 1753        Code  Status History    Date Active Date Inactive Code Status Order ID Comments User Context   05/26/2020 1753 05/26/2020 1753 DNR [id number]  Vashti Hey, MD ED   05/22/2020 1810 05/25/2020 1945 DNR [id number]  Nita Sells, MD ED   02/26/2020 1533 03/02/2020 2132 DNR [id number]  [name], MD Inpatient   02/12/2020 1756 02/26/2020 0622 DNR [id number]  Bary Leriche, PA-C Inpatient   02/12/2020 1756 02/12/2020 1756 DNR [id number]  Bary Leriche, PA-C Inpatient   02/07/2020 1004 02/12/2020 1751 DNR [id number]   Karmen Bongo, MD ED   10/19/2018 0009 10/21/2018 1355 Full Code [id number]  Ivor Costa, MD ED   Advance Care Planning Activity    Advance Directive Documentation     Most Recent Value  Type of Advance Directive Healthcare Power of Attorney  Pre-existing out of facility DNR order (yellow form or pink MOST form) --  "MOST" Form in Place? --        IV Access:    Peripheral IV   Procedures and diagnostic studies:   CT Head Wo Contrast  Result Date: 05/26/2020 CLINICAL DATA:  Altered mental status. EXAM: CT HEAD WITHOUT CONTRAST TECHNIQUE: Contiguous axial images were obtained from the base of the skull through the vertex without intravenous contrast. COMPARISON:  Brain CT February 21, 2020. FINDINGS: Brain: Ventricles and sulci are prominent compatible with atrophy. Periventricular and subcortical white matter hypodensities compatible with chronic microvascular ischemic changes. No evidence for acute cortically based infarct, intracranial hemorrhage, mass lesion or mass-effect. Vascular: Unremarkable Skull: Intact. Sinuses/Orbits: Fluid and mucosal thickening involving the sphenoid sinus. Remainder paranasal sinuses unremarkable. Mastoid air cells unremarkable. Orbits unremarkable. Other: None. IMPRESSION: 1. No acute intracranial process. 2. Atrophy and chronic microvascular ischemic changes. Electronically Signed   By: Lovey Newcomer M.D.   On: 05/26/2020 10:57   CT Chest Wo Contrast  Result Date: 05/26/2020 CLINICAL DATA:  Chest pain and shortness of breath. EXAM: CT CHEST WITHOUT CONTRAST TECHNIQUE: Multidetector CT imaging of the chest was performed following the standard protocol without IV contrast. COMPARISON:  Chest radiograph earlier same day; CT a chest 05/22/2020 FINDINGS: Cardiovascular: Normal heart size. Dilated pulmonary artery measuring 4.1 cm. No pericardial effusion. Mediastinum/Nodes: No axillary adenopathy. Similar-appearing 1.3 cm prevascular node (image 17; series 4).  Similar-appearing 10 mm right paratracheal node (image 17; series 4). Normal appearance of the esophagus. Lungs/Pleura: Expiratory phase imaging. Moderate bilateral pleural effusions, increased on the right. Bilateral lower lobe consolidative opacities likely atelectasis. Partial atelectasis involving the perifissural right middle lobe. Interval increase in bilateral upper lobe patchy ground-glass opacities. No pneumothorax. Upper Abdomen: No acute process. Musculoskeletal: Redemonstrated posterior fusion hardware involving the thoracic spine. No aggressive or acute appearing osseous lesions. IMPRESSION: 1. Moderate bilateral pleural effusions, increased on the right. Bilateral lower lobe consolidative opacities likely atelectasis. 2. Interval increase in bilateral upper lobe patchy ground-glass opacities which may represent atypical infectious process or inflammatory process. 3. Dilated pulmonary artery as can be seen with pulmonary arterial hypertension. 4. Similar-appearing mediastinal adenopathy, likely reactive in etiology. Electronically Signed   By: Lovey Newcomer M.D.   On: 05/26/2020 15:04   DG Chest Portable 1 View  Result Date: 05/26/2020 CLINICAL DATA:  Patient with hypoxia. EXAM: PORTABLE CHEST 1 VIEW COMPARISON:  Chest radiograph 05/23/2020. FINDINGS: Monitoring leads overlie the patient. Stable cardiac and mediastinal contours. Minimal bibasilar heterogeneous opacities. No pleural effusion or pneumothorax. Thoracic spinal fusion hardware. IMPRESSION: Bibasilar opacities favored to represent atelectasis. Electronically Signed   By:  Lovey Newcomer M.D.   On: 05/26/2020 11:01     Medical Consultants:    None.  Anti-Infectives:   Augmentin  Subjective:    Crystal Ashley relates her orthopnea has resolved her breathing is better.  Objective:    Vitals:   05/27/20 2200 05/28/20 0000 05/28/20 0307 05/28/20 0400  BP: (!) 101/49 103/68 117/85 101/72  Pulse:   (!) 106   Resp: 19 19  19 20   Temp: 98.3 F (36.8 C)  98.5 F (36.9 C) 98 F (36.7 C)  TempSrc: Oral  Oral   SpO2: 99% 100% 100% 100%  Weight:   84.9 kg 84.9 kg  Height:       SpO2: 100 % O2 Flow Rate (L/min): 3 L/min   Intake/Output Summary (Last 24 hours) at 05/28/2020 0915 Last data filed at 05/28/2020 0309 Gross per 24 hour  Intake 1360.09 ml  Output 2800 ml  Net -1439.91 ml   Filed Weights   05/27/20 0007 05/28/20 0307 05/28/20 0400  Weight: 86.4 kg 84.9 kg 84.9 kg    Exam: General exam: In no acute distress. Respiratory system: Good air movement and clear to auscultation Cardiovascular system: S1 & S2 heard, RRR.  No further JVD Gastrointestinal system: Abdomen is nondistended, soft and nontender.  Extremities: No pedal edema. Skin: No rashes, lesions or ulcers  Data Reviewed:    Labs: Basic Metabolic Panel: Recent Labs  Lab 05/22/20 1128 05/22/20 2022 05/23/20 0748 05/23/20 0748 05/25/20 0212 05/25/20 0212 05/26/20 1027 05/26/20 1027 05/26/20 1142 05/26/20 1142 05/27/20 0716 05/28/20 0605  NA   < >  --  142   < > 141  --  140  --  141  --  142 143  K   < >  --  3.8   < > 4.3   < > 4.5   < > 4.8   < > 4.5 3.3*  CL   < >  --  100  --  102  --   --   --  104  --  101 97*  CO2   < >  --  33*  --  31  --   --   --  26  --  32 35*  GLUCOSE   < >  --  128*  --  135*  --   --   --  122*  --  102* 131*  BUN   < >  --  10  --  11  --   --   --  13  --  14 12  CREATININE   < >  --  0.94  --  0.94  --   --   --  1.03*  --  1.13* 1.10*  CALCIUM   < >  --  8.4*  --  8.4*  --   --   --  8.5*  --  8.4* 8.2*  MG  --  2.1  --   --   --   --   --   --   --   --   --   --    < > = values in this interval not displayed.   GFR Estimated Creatinine Clearance: 43.4 mL/min (A) (by C-G formula based on SCr of 1.1 mg/dL (H)). Liver Function Tests: Recent Labs  Lab 05/22/20 1128 05/23/20 0748 05/25/20 0212 05/26/20 1142  AST 17 13* 21 22  ALT 11 9 7 12   ALKPHOS 88 74 70  76  BILITOT  0.6 0.4 0.7 0.5  PROT 6.4* 5.6* 5.9* 6.3*  ALBUMIN 2.5* 2.1* 2.2* 2.5*   No results for input(s): LIPASE, AMYLASE in the last 168 hours. No results for input(s): AMMONIA in the last 168 hours. Coagulation profile No results for input(s): INR, PROTIME in the last 168 hours. COVID-19 Labs  No results for input(s): DDIMER, FERRITIN, LDH, CRP in the last 72 hours.  Lab Results  Component Value Date   SARSCOV2NAA NEGATIVE 05/26/2020   [place] NEGATIVE 05/24/2020   [place] NEGATIVE 05/22/2020   [place] NEGATIVE 03/01/2020    CBC: Recent Labs  Lab 05/22/20 1128 05/22/20 1128 05/23/20 0748 05/25/20 0212 05/26/20 1027 05/26/20 1142 05/27/20 0716  WBC 12.5*  --  7.3 9.3  --  12.3* 9.1  NEUTROABS 10.4*  --   --  5.9  --  9.7*  --   HGB 9.9*   < > 8.5* 8.3* 9.5* 8.9* 8.0*  HCT 33.1*   < > 30.2* 29.9* 28.0* 32.9* 28.4*  MCV 91.7  --  94.7 94.6  --  98.2 95.6  PLT 382  --  337 359  --  383 364   < > = values in this interval not displayed.   Cardiac Enzymes: No results for input(s): CKTOTAL, CKMB, CKMBINDEX, TROPONINI in the last 168 hours. BNP (last 3 results) No results for input(s): PROBNP in the last 8760 hours. CBG: Recent Labs  Lab 05/26/20 2148 05/27/20 0541 05/27/20 1202 05/27/20 2146 05/28/20 0613  GLUCAP 135* 114* 152* 179* 127*   D-Dimer: No results for input(s): DDIMER in the last 72 hours. Hgb A1c: Recent Labs    05/26/20 2017  HGBA1C 6.1*   Lipid Profile: No results for input(s): CHOL, HDL, LDLCALC, TRIG, CHOLHDL, LDLDIRECT in the last 72 hours. Thyroid function studies: Recent Labs    05/26/20 1142  TSH 5.001*   Anemia work up: No results for input(s): VITAMINB12, FOLATE, FERRITIN, TIBC, IRON, RETICCTPCT in the last 72 hours. Sepsis Labs: Recent Labs  Lab 05/23/20 0748 05/25/20 0212 05/26/20 1142 05/27/20 0716 05/27/20 0809  PROCALCITON  --   --   --   --  0.16  WBC 7.3 9.3 12.3* 9.1  --   LATICACIDVEN  --   --  1.5  --   --     Microbiology Recent Results (from the past 240 hour(s))  Respiratory Panel by RT PCR (Flu A&B, Covid) - Nasopharyngeal Swab     Status: None   Collection Time: 05/22/20 10:49 AM   Specimen: Nasopharyngeal Swab  Result Value Ref Range Status   SARS Coronavirus 2 by RT PCR NEGATIVE NEGATIVE Final    Comment: (NOTE) SARS-CoV-2 target nucleic acids are NOT DETECTED.  The SARS-CoV-2 RNA is generally detectable in upper respiratoy specimens during the acute phase of infection. The lowest concentration of SARS-CoV-2 viral copies this assay can detect is 131 copies/mL. A negative result does not preclude SARS-Cov-2 infection and should not be used as the sole basis for treatment or other patient management decisions. A negative result may occur with  improper specimen collection/handling, submission of specimen other than nasopharyngeal swab, presence of viral mutation(s) within the areas targeted by this assay, and inadequate number of viral copies (<131 copies/mL). A negative result must be combined with clinical observations, patient history, and epidemiological information. The expected result is Negative.  Fact Sheet for Patients:  PinkCheek.be  Fact Sheet for Healthcare Providers:  GravelBags.it  This test is no t yet approved  or cleared by the [place] and  has been authorized for detection and/or diagnosis of SARS-CoV-2 by FDA under an Emergency Use Authorization (EUA). This EUA will remain  in effect (meaning this test can be used) for the duration of the COVID-19 declaration under Section 564(b)(1) of the Act, 21 U.S.C. section 360bbb-3(b)(1), unless the authorization is terminated or revoked sooner.     Influenza A by PCR NEGATIVE NEGATIVE Final   Influenza B by PCR NEGATIVE NEGATIVE Final    Comment: (NOTE) The Xpert Xpress SARS-CoV-2/FLU/RSV assay is intended as an aid in  the diagnosis of influenza  from Nasopharyngeal swab specimens and  should not be used as a sole basis for treatment. Nasal washings and  aspirates are unacceptable for Xpert Xpress SARS-CoV-2/FLU/RSV  testing.  Fact Sheet for Patients: PinkCheek.be  Fact Sheet for Healthcare Providers: GravelBags.it  This test is not yet approved or cleared by the [place] FDA and  has been authorized for detection and/or diagnosis of SARS-CoV-2 by  FDA under an Emergency Use Authorization (EUA). This EUA will remain  in effect (meaning this test can be used) for the duration of the  Covid-19 declaration under Section 564(b)(1) of the Act, 21  U.S.C. section 360bbb-3(b)(1), unless the authorization is  terminated or revoked. Performed at [facility], [place] [address], [place], [place] [address]   SARS Coronavirus 2 by RT PCR (hospital order, performed in [facility] hospital lab) Nasopharyngeal Nasopharyngeal Swab     Status: None   Collection Time: 05/24/20 10:04 AM   Specimen: Nasopharyngeal Swab  Result Value Ref Range Status   SARS Coronavirus 2 NEGATIVE NEGATIVE Final    Comment: (NOTE) SARS-CoV-2 target nucleic acids are NOT DETECTED.  The SARS-CoV-2 RNA is generally detectable in upper and lower respiratory specimens during the acute phase of infection. The lowest concentration of SARS-CoV-2 viral copies this assay can detect is 250 copies / mL. A negative result does not preclude SARS-CoV-2 infection and should not be used as the sole basis for treatment or other patient management decisions.  A negative result may occur with improper specimen collection / handling, submission of specimen other than nasopharyngeal swab, presence of viral mutation(s) within the areas targeted by this assay, and inadequate number of viral copies (<250 copies / mL). A negative result must be combined with clinical observations, patient history, and epidemiological  information.  Fact Sheet for Patients:   StrictlyIdeas.no  Fact Sheet for Healthcare Providers: BankingDealers.co.za  This test is not yet approved or  cleared by the [place] FDA and has been authorized for detection and/or diagnosis of SARS-CoV-2 by FDA under an Emergency Use Authorization (EUA).  This EUA will remain in effect (meaning this test can be used) for the duration of the COVID-19 declaration under Section 564(b)(1) of the Act, 21 U.S.C. section 360bbb-3(b)(1), unless the authorization is terminated or revoked sooner.  Performed at [facility], [place] [address], [place],  [address]   Respiratory Panel by RT PCR (Flu A&B, Covid) - Nasopharyngeal Swab     Status: None   Collection Time: 05/26/20 10:24 AM   Specimen: Nasopharyngeal Swab  Result Value Ref Range Status   SARS Coronavirus 2 by RT PCR NEGATIVE NEGATIVE Final    Comment: (NOTE) SARS-CoV-2 target nucleic acids are NOT DETECTED.  The SARS-CoV-2 RNA is generally detectable in upper respiratoy specimens during the acute phase of infection. The lowest concentration of SARS-CoV-2 viral copies this assay can detect  is 131 copies/mL. A negative result does not preclude SARS-Cov-2 infection and should not be used as the sole basis for treatment or other patient management decisions. A negative result may occur with  improper specimen collection/handling, submission of specimen other than nasopharyngeal swab, presence of viral mutation(s) within the areas targeted by this assay, and inadequate number of viral copies (<131 copies/mL). A negative result must be combined with clinical observations, patient history, and epidemiological information. The expected result is Negative.  Fact Sheet for Patients:  PinkCheek.be  Fact Sheet for Healthcare Providers:  GravelBags.it  This test is no t yet  approved or cleared by the [place] FDA and  has been authorized for detection and/or diagnosis of SARS-CoV-2 by FDA under an Emergency Use Authorization (EUA). This EUA will remain  in effect (meaning this test can be used) for the duration of the COVID-19 declaration under Section 564(b)(1) of the Act, 21 U.S.C. section 360bbb-3(b)(1), unless the authorization is terminated or revoked sooner.     Influenza A by PCR NEGATIVE NEGATIVE Final   Influenza B by PCR NEGATIVE NEGATIVE Final    Comment: (NOTE) The Xpert Xpress SARS-CoV-2/FLU/RSV assay is intended as an aid in  the diagnosis of influenza from Nasopharyngeal swab specimens and  should not be used as a sole basis for treatment. Nasal washings and  aspirates are unacceptable for Xpert Xpress SARS-CoV-2/FLU/RSV  testing.  Fact Sheet for Patients: PinkCheek.be  Fact Sheet for Healthcare Providers: GravelBags.it  This test is not yet approved or cleared by the [place] FDA and  has been authorized for detection and/or diagnosis of SARS-CoV-2 by  FDA under an Emergency Use Authorization (EUA). This EUA will remain  in effect (meaning this test can be used) for the duration of the  Covid-19 declaration under Section 564(b)(1) of the Act, 21  U.S.C. section 360bbb-3(b)(1), unless the authorization is  terminated or revoked. Performed at [facility], [place] [address] [place], [place] [address]   Blood culture (routine x 2)     Status: None (Preliminary result)   Collection Time: 05/26/20 11:57 AM   Specimen: BLOOD LEFT HAND  Result Value Ref Range Status   Specimen Description BLOOD LEFT HAND  Final   Special Requests   Final    BOTTLES DRAWN AEROBIC AND ANAEROBIC Blood Culture results may not be optimal due to an inadequate volume of blood received in culture bottles   Culture   Final    NO GROWTH 2 DAYS Performed at [facility], [place]  [address] [place], [place] [address]    Report Status PENDING  Incomplete  Blood culture (routine x 2)     Status: None (Preliminary result)   Collection Time: 05/26/20  8:28 PM   Specimen: BLOOD  Result Value Ref Range Status   Specimen Description BLOOD SITE NOT SPECIFIED  Final   Special Requests   Final    BOTTLES DRAWN AEROBIC ONLY Blood Culture results may not be optimal due to an inadequate volume of blood received in culture bottles   Culture   Final    NO GROWTH 2 DAYS Performed at [facility], [place] [address] [place], [place] [address]    Report Status PENDING  Incomplete     Medications:   . atorvastatin  10 mg Oral QPM  . diltiazem  180 mg Oral Daily  . dorzolamide-timolol  1 drop Both Eyes BID  . feeding supplement (ENSURE ENLIVE)  237 mL Oral  BID BM  . furosemide  40 mg Intravenous Q12H  . influenza vaccine adjuvanted  0.5 mL Intramuscular Tomorrow-1000  . insulin aspart  0-20 Units Subcutaneous TID WC  . latanoprost  1 drop Both Eyes QHS  . levothyroxine  75 mcg Oral QAC breakfast  . lidocaine  1 patch Transdermal Q24H  . metoprolol succinate  100 mg Oral QPC breakfast  . Muscle Rub  1 application Topical Daily  . pantoprazole  40 mg Oral BID  . potassium chloride  10 mEq Oral Daily  . rivaroxaban  20 mg Oral Q supper  . senna  2 tablet Oral BID   Continuous Infusions: . sodium chloride Stopped (05/28/20 0309)  . ampicillin-sulbactam (UNASYN) IV 1.5 g (05/28/20 0309)      LOS: 2 days   Charlynne Cousins  [facility]  05/28/2020, 9:15 AM

## 2020-05-28 NOTE — Plan of Care (Signed)

## 2020-05-29 DIAGNOSIS — E118 Type 2 diabetes mellitus with unspecified complications: Secondary | ICD-10-CM | POA: Diagnosis not present

## 2020-05-29 DIAGNOSIS — S32599D Other specified fracture of unspecified pubis, subsequent encounter for fracture with routine healing: Secondary | ICD-10-CM | POA: Diagnosis not present

## 2020-05-29 DIAGNOSIS — Y95 Nosocomial condition: Secondary | ICD-10-CM | POA: Diagnosis not present

## 2020-05-29 DIAGNOSIS — R1319 Other dysphagia: Secondary | ICD-10-CM | POA: Diagnosis not present

## 2020-05-29 DIAGNOSIS — I4891 Unspecified atrial fibrillation: Secondary | ICD-10-CM | POA: Diagnosis not present

## 2020-05-29 DIAGNOSIS — M6281 Muscle weakness (generalized): Secondary | ICD-10-CM | POA: Diagnosis not present

## 2020-05-29 DIAGNOSIS — E876 Hypokalemia: Secondary | ICD-10-CM | POA: Diagnosis not present

## 2020-05-29 DIAGNOSIS — R1312 Dysphagia, oropharyngeal phase: Secondary | ICD-10-CM | POA: Diagnosis not present

## 2020-05-29 DIAGNOSIS — Z794 Long term (current) use of insulin: Secondary | ICD-10-CM | POA: Diagnosis not present

## 2020-05-29 DIAGNOSIS — I152 Hypertension secondary to endocrine disorders: Secondary | ICD-10-CM | POA: Diagnosis not present

## 2020-05-29 DIAGNOSIS — R5381 Other malaise: Secondary | ICD-10-CM | POA: Diagnosis not present

## 2020-05-29 DIAGNOSIS — E038 Other specified hypothyroidism: Secondary | ICD-10-CM | POA: Diagnosis not present

## 2020-05-29 DIAGNOSIS — J690 Pneumonitis due to inhalation of food and vomit: Secondary | ICD-10-CM | POA: Diagnosis not present

## 2020-05-29 DIAGNOSIS — R41841 Cognitive communication deficit: Secondary | ICD-10-CM | POA: Diagnosis not present

## 2020-05-29 DIAGNOSIS — E039 Hypothyroidism, unspecified: Secondary | ICD-10-CM | POA: Diagnosis not present

## 2020-05-29 DIAGNOSIS — I5189 Other ill-defined heart diseases: Secondary | ICD-10-CM | POA: Diagnosis not present

## 2020-05-29 DIAGNOSIS — R410 Disorientation, unspecified: Secondary | ICD-10-CM | POA: Diagnosis not present

## 2020-05-29 DIAGNOSIS — Z7401 Bed confinement status: Secondary | ICD-10-CM | POA: Diagnosis not present

## 2020-05-29 DIAGNOSIS — E119 Type 2 diabetes mellitus without complications: Secondary | ICD-10-CM | POA: Diagnosis not present

## 2020-05-29 DIAGNOSIS — S32519D Fracture of superior rim of unspecified pubis, subsequent encounter for fracture with routine healing: Secondary | ICD-10-CM | POA: Diagnosis not present

## 2020-05-29 DIAGNOSIS — M810 Age-related osteoporosis without current pathological fracture: Secondary | ICD-10-CM | POA: Diagnosis not present

## 2020-05-29 DIAGNOSIS — E1159 Type 2 diabetes mellitus with other circulatory complications: Secondary | ICD-10-CM | POA: Diagnosis not present

## 2020-05-29 DIAGNOSIS — M8088XA Other osteoporosis with current pathological fracture, vertebra(e), initial encounter for fracture: Secondary | ICD-10-CM | POA: Diagnosis not present

## 2020-05-29 DIAGNOSIS — I482 Chronic atrial fibrillation, unspecified: Secondary | ICD-10-CM | POA: Diagnosis not present

## 2020-05-29 DIAGNOSIS — E1165 Type 2 diabetes mellitus with hyperglycemia: Secondary | ICD-10-CM | POA: Diagnosis not present

## 2020-05-29 DIAGNOSIS — I480 Paroxysmal atrial fibrillation: Secondary | ICD-10-CM | POA: Diagnosis not present

## 2020-05-29 DIAGNOSIS — J9601 Acute respiratory failure with hypoxia: Secondary | ICD-10-CM | POA: Diagnosis not present

## 2020-05-29 DIAGNOSIS — M6259 Muscle wasting and atrophy, not elsewhere classified, multiple sites: Secondary | ICD-10-CM | POA: Diagnosis not present

## 2020-05-29 DIAGNOSIS — U071 COVID-19: Secondary | ICD-10-CM | POA: Diagnosis not present

## 2020-05-29 DIAGNOSIS — M2550 Pain in unspecified joint: Secondary | ICD-10-CM | POA: Diagnosis not present

## 2020-05-29 DIAGNOSIS — Z23 Encounter for immunization: Secondary | ICD-10-CM | POA: Diagnosis not present

## 2020-05-29 DIAGNOSIS — R2689 Other abnormalities of gait and mobility: Secondary | ICD-10-CM | POA: Diagnosis not present

## 2020-05-29 DIAGNOSIS — I10 Essential (primary) hypertension: Secondary | ICD-10-CM | POA: Diagnosis not present

## 2020-05-29 DIAGNOSIS — I509 Heart failure, unspecified: Secondary | ICD-10-CM | POA: Diagnosis not present

## 2020-05-29 DIAGNOSIS — S22000D Wedge compression fracture of unspecified thoracic vertebra, subsequent encounter for fracture with routine healing: Secondary | ICD-10-CM | POA: Diagnosis not present

## 2020-05-29 DIAGNOSIS — R262 Difficulty in walking, not elsewhere classified: Secondary | ICD-10-CM | POA: Diagnosis not present

## 2020-05-29 LAB — BASIC METABOLIC PANEL
Anion gap: 9 (ref 5–15)
BUN: 13 mg/dL (ref 8–23)
CO2: 34 mmol/L — ABNORMAL HIGH (ref 22–32)
Calcium: 8.6 mg/dL — ABNORMAL LOW (ref 8.9–10.3)
Chloride: 96 mmol/L — ABNORMAL LOW (ref 98–111)
Creatinine, Ser: 1.01 mg/dL — ABNORMAL HIGH (ref 0.44–1.00)
GFR, Estimated: 55 mL/min — ABNORMAL LOW (ref >60)
Glucose, Bld: 136 mg/dL — ABNORMAL HIGH (ref 70–99)
Potassium: 3.2 mmol/L — ABNORMAL LOW (ref 3.5–5.1)
Sodium: 139 mmol/L (ref 135–145)

## 2020-05-29 LAB — GLUCOSE, CAPILLARY
Glucose-Capillary: 126 mg/dL — ABNORMAL HIGH (ref 70–99)
Glucose-Capillary: 188 mg/dL — ABNORMAL HIGH (ref 70–99)

## 2020-05-29 LAB — PROCALCITONIN: Procalcitonin: <0.1 ng/mL

## 2020-05-29 LAB — SARS CORONAVIRUS 2 BY RT PCR (HOSPITAL ORDER, PERFORMED IN ~~LOC~~ HOSPITAL LAB): SARS Coronavirus 2: NEGATIVE

## 2020-05-29 MED ORDER — POTASSIUM CHLORIDE CRYS ER 20 MEQ PO TBCR: 40.0000 meq | EXTENDED_RELEASE_TABLET | Freq: Two times a day (BID) | ORAL | Status: DC

## 2020-05-29 NOTE — TOC Transition Note (Signed)
Transition of Care [facility]) - CM/SW Discharge Note   Patient Details  Name: Crystal Ashley MRN: [id number] Date of Birth: Nov 13, 1945  [facility] of [facility]) CM/SW Contact:  Loreta Ave, [place] Phone Number: 05/29/2020, 11:02 AM   Clinical Narrative:    Patient will DC to: [facility] Anticipated DC date: 05/29/20 Family notified: Hargens,Prashant  Transport by: Corey Harold   Per MD patient ready for DC to [facility] . RN to call report prior to discharge [phone]. RN, patient, patient's family, and facility notified of DC. Discharge Summary and FL2 sent to facility. DC packet on chart. Ambulance transport requested for patient.   CSW will sign off for now as social work intervention is no longer needed. Please consult [place] again if new needs arise.     Final next level of care: Skilled Nursing Facility Barriers to Discharge: Barriers Resolved   Patient Goals and CMS Choice Patient states their goals for this hospitalization and ongoing recovery are:: Get better soon CMS Medicare.gov Compare Post Acute Care list provided to:: Patient Choice offered to / list presented to : Patient  Discharge Placement              Patient chooses bed at: [facility] Patient to be transferred to facility by: [place] Name of family member notified: Triggs,Prashant Patient and family notified of of transfer: 05/29/20  Discharge Plan and Services                                     Social Determinants of Health ([place]) Interventions     Readmission Risk Interventions Readmission Risk Prevention Plan 02/08/2020  Transportation Screening Complete  PCP or Specialist Appt within 5-7 Days Complete  Home Care Screening Complete  Medication Review (RN CM) Complete  Some recent data might be hidden

## 2020-05-29 NOTE — Consult Note (Signed)
   [facility] CM Inpatient Consult   05/29/2020  Crystal Ashley 01-27-1946 [id number]   [facility] [ACO] Patient:  Medicare NextGen  Patient screened for extreme high risk score for unplanned readmission score and for hospitalizations to check if potential [place] Management service needs.  Review of patient's medical record reveals patient is transitioning to a skilled facility.  Primary Care Provider is listed as [name], PA this provider is listed to provide the transition of care [TOC] for post hospital follow up.  Plan: Will alert [facility] RN PAC of disposition planned for [facility] at transition. Continue to follow progress and disposition to assess for post hospital care management needs.    Please place a [facility] Care Management consult as appropriate and for questions contact:   Natividad Brood, RN BSN [facility] Liaison  [phone] business mobile phone Toll free office [phone]  Fax number: [phone] [email or web address]

## 2020-05-29 NOTE — NC FL2 (Signed)
[place] LEVEL OF CARE SCREENING TOOL     IDENTIFICATION  Patient Name: Crystal Ashley Birthdate: 07-07-1946 Sex: female Admission Date (Current Location): 05/26/2020  [facility] and [place] Number:  Herbalist and Address:  [facility] [place], [place] [address] [place], [place] [address]      Provider Number: [id number]  Attending Physician Name and Address:  Charlynne Cousins, MD  Relative Name and Phone Number:  Ellsie, Violette (Son) [phone] [facility])    Current Level of Care: Hospital Recommended Level of Care: [place] Prior Approval Number:    Date Approved/Denied:   PASRR Number: [id number]  Discharge Plan: SNF    Current Diagnoses: Patient Active Problem List   Diagnosis Date Noted  . Pressure injury of skin 05/27/2020  . SOB (shortness of breath) 05/26/2020  . PNA (pneumonia) 05/22/2020  . Elective surgery   . Fusion of spine, thoracolumbar region 02/26/2020  . Acute blood loss anemia   . PAF (paroxysmal atrial fibrillation) ([place])   . Controlled type 2 diabetes mellitus with hyperglycemia, without long-term current use of insulin ([place])   . Fracture of vertebra due to osteoporosis ([place])   . Acute midline thoracic back pain   . Multiple traumatic injuries 02/12/2020  . Closed fracture of multiple pubic rami, right, initial encounter ([place]) 02/07/2020  . Atrial fibrillation, chronic ([place]) 02/07/2020  . Chronic combined systolic (congestive) and diastolic (congestive) heart failure ([place])   . Acute respiratory failure with hypoxia ([place]) 10/18/2018  . Declining mobility 09/23/2018  . B12 deficiency 09/23/2018  . Morbid obesity with BMI of 40.0-44.9, adult ([place]) 09/23/2018  . Anemia 09/23/2018  . History of cardioversion 04/26/2018  . Type 2 diabetes mellitus without complication, without long-term current use of insulin ([place]) 03/15/2018  . Atrial fibrillation with RVR ([place]) 03/15/2018  . Morbid  obesity ([place]) 01/04/2017  . Hyperlipidemia associated with type 2 diabetes mellitus ([place]) 01/04/2017  . Vitamin D deficiency 12/15/2016  . Glaucoma   . Hypertension associated with diabetes ([place]) 04/19/2014  . Hypothyroidism 04/19/2014  . History of endometrial cancer 03/30/2014    Orientation RESPIRATION BLADDER Height & Weight     Self, Time, Place, Situation  Normal Continent, External catheter Weight: 186 lb 8.2 oz (84.6 kg) Height:  5' (152.4 cm)  BEHAVIORAL SYMPTOMS/MOOD NEUROLOGICAL BOWEL NUTRITION STATUS      Continent Diet (see dc summary)  AMBULATORY STATUS COMMUNICATION OF NEEDS Skin   Extensive Assist Verbally Other (Comment) (Pressure Injury 05/26/20 Coccyx medial stage 1)                       Personal Care Assistance Level of Assistance  Bathing, Feeding, Dressing Bathing Assistance: Limited assistance Feeding assistance: Independent Dressing Assistance: Limited assistance     Functional Limitations Info  Sight, Hearing, Speech Sight Info: Adequate Hearing Info: Adequate Speech Info: Adequate    SPECIAL CARE FACTORS FREQUENCY  PT (By licensed PT), OT (By licensed OT)     PT Frequency: 5x week OT Frequency: 5x week            Contractures Contractures Info: Not present    Additional Factors Info  Code Status, Allergies, Insulin Sliding Scale Code Status Info: DNR Allergies Info: Meat (Alpha-gal)   Insulin Sliding Scale Info: insulin aspart (novoLOG) injection 0-20 Units 3X daily with meals,       Current Medications (05/29/2020):  This is the current hospital active medication list Current Facility-Administered Medications  Medication Dose Route Frequency Provider Last Rate Last Admin  . 0.9 %  sodium chloride infusion   Intravenous PRN Charlynne Cousins, MD   Stopped at 05/28/20 0309  . atorvastatin (LIPITOR) tablet 10 mg  10 mg Oral QPM Bonnell Public Tublu, MD   10 mg at 05/28/20 1702  . diltiazem (CARDIZEM CD) 24 hr capsule  180 mg  180 mg Oral Daily Bonnell Public Tublu, MD   180 mg at 05/29/20 0833  . dorzolamide-timolol (COSOPT) 22.3-6.8 MG/ML ophthalmic solution 1 drop  1 drop Both Eyes BID Vashti Hey, MD   1 drop at 05/29/20 0834  . feeding supplement (ENSURE ENLIVE) (ENSURE ENLIVE) liquid 237 mL  237 mL Oral BID BM Bonnell Public Tublu, MD   237 mL at 05/29/20 0831  . furosemide (LASIX) tablet 40 mg  40 mg Oral Daily Charlynne Cousins, MD   40 mg at 05/29/20 4696  . insulin aspart (novoLOG) injection 0-20 Units  0-20 Units Subcutaneous TID WC Vashti Hey, MD   3 Units at 05/29/20 [phone]  . latanoprost (XALATAN) 0.005 % ophthalmic solution 1 drop  1 drop Both Eyes QHS Vashti Hey, MD   1 drop at 05/28/20 2242  . levothyroxine (SYNTHROID) tablet 75 mcg  75 mcg Oral QAC breakfast Vashti Hey, MD   75 mcg at 05/29/20 0523  . lidocaine (LIDODERM) 5 % 1 patch  1 patch Transdermal Q24H Vashti Hey, MD   1 patch at 05/29/20 [phone]  . metoprolol succinate (TOPROL-XL) 24 hr tablet 100 mg  100 mg Oral QPC breakfast Bonnell Public Tublu, MD   100 mg at 05/29/20 0832  . Muscle Rub CREA 1 application  1 application Topical Daily Vashti Hey, MD   1 application at 24/40/10 0956  . ondansetron (ZOFRAN) tablet 4 mg  4 mg Oral Q8H PRN Bonnell Public Tublu, MD      . pantoprazole (PROTONIX) EC tablet 40 mg  40 mg Oral BID Bonnell Public Tublu, MD   40 mg at 05/29/20 0832  . polyethylene glycol (MIRALAX / GLYCOLAX) packet 17 g  17 g Oral Daily PRN Bonnell Public Tublu, MD      . potassium chloride SA (KLOR-CON) CR tablet 10 mEq  10 mEq Oral Daily Bonnell Public Tublu, MD   10 mEq at 05/29/20 0832  . rivaroxaban (XARELTO) tablet 20 mg  20 mg Oral Q supper Bonnell Public Tublu, MD   20 mg at 05/28/20 1702  . senna (SENOKOT) tablet 17.2 mg  2 tablet Oral BID Bonnell Public Tublu, MD   17.2 mg at 05/27/20 2725      Discharge Medications: Please see discharge summary for a list of discharge medications.  Relevant Imaging Results:  Relevant Lab Results:   Additional Information    Suyash Amory B Sadiq Mccauley, LCSWA

## 2020-05-29 NOTE — Progress Notes (Signed)
Called and provided report to [facility].

## 2020-05-29 NOTE — Progress Notes (Signed)
[place] facility to give report for the transfer, it was unsuccessful. Will try again in 15 mins.

## 2020-05-29 NOTE — Discharge Summary (Signed)
Physician Discharge Summary  Crystal Ashley YNW:[id number] DOB: June 19, 1946 DOA: 05/26/2020  PCP: [name], PA  Admit date: 05/26/2020 Discharge date: 05/29/2020  Admitted From: SNF Disposition:  SNF  Recommendations for Outpatient Follow-up:  1. Follow up with PCP in 1-2 weeks 2. Please obtain BMP/CBC in one week   Home Health:No EQuipment/Devices:None  Discharge Condition:Stable CODE STATUS:DNR Diet recommendation: Heart Healthy   Brief/Interim Summary: 75 y.o. female past medical history significant for atrial fibrillation on Xarelto history of uterine cancer status post hysterectomy, with poor functional status since pelvic fracture in 2021 was discharged to rehab on the day prior to admission on Omnicef for 3 days community-acquired pneumonia.  The patient has been weaned down to 1 L upon discharge apparently was found obtunded in acute respiratory failure in the mid 50s he was placed on 4 L EMS was called and transferred to the ED CT showed increased bilateral lower lobe groundglass opacity worse than prior   Discharge Diagnoses:  Principal Problem:   Acute respiratory failure with hypoxia ([place]) Active Problems:   Hypertension associated with diabetes ([place])   Hypothyroidism   Morbid obesity with BMI of 40.0-44.9, adult ([place])   Chronic combined systolic (congestive) and diastolic (congestive) heart failure (HCC)   Atrial fibrillation, chronic ([place])   Controlled type 2 diabetes mellitus with hyperglycemia, without long-term current use of insulin (HCC)   PNA (pneumonia)   Pressure injury of skin  Acute respiratory failure with hypoxia secondary to acute decompensated heart failure: She was started on admission on IV antibiotics as her chest x-ray showed bilateral infiltrates, with an elevated BNP she appeared fluid overloaded on physical exam. Antibiotics were discontinued within 24 hours she was started on IV Lasix with brisk diuresis she diuresed about 4 L. She  was changed to her home dose of oral Lasix her diet was resumed she will continue strict I's and O's and daily weights she was discharged on Lasix 40 mg daily to her skilled nursing facility. Physical therapy evaluated the patient the recommended skilled nursing facility.  T wave inversion in anterior leads Patient cardiac biomarkers have remained flat he denies any chest pain no hypoxia.  Chronic atrial fibrillation: Rate controlled metoprolol and diltiazem continue Xarelto no changes made to medication.  Uncontrolled diabetes mellitus type 2: No changes made to her medication continue current regimen.  Hypothyroidism: Continue Synthroid.  Stage I sacral decubitus ulcer present on admission:   Discharge Instructions  Discharge Instructions    Diet - low sodium heart healthy   Complete by: As directed    Increase activity slowly   Complete by: As directed    No wound care   Complete by: As directed      Allergies as of 05/29/2020      Reactions   Meat [alpha-gal] Other (See Comments)   Pt preference- No meat (beef/chicken/pork/turkey/etc.) with the exception of seafood.      Medication List    STOP taking these medications   cefdinir 300 MG capsule Commonly known as: OMNICEF   ondansetron 4 MG tablet Commonly known as: ZOFRAN     TAKE these medications   acetaminophen 500 MG tablet Commonly known as: TYLENOL Take 1,000 mg by mouth every 8 (eight) hours.   Aspercreme Lidocaine 4 % Ptch Generic drug: Lidocaine Place 1 patch onto the skin See admin instructions. Apply 1 patch to right mid back and right hip every morning - remove at night   atorvastatin 10 MG tablet Commonly known as: LIPITOR  Take 1 tablet (10 mg total) by mouth daily. What changed: when to take this   Biofreeze 4 % Gel Generic drug: Menthol (Topical Analgesic) Apply 1 application topically daily. Apply to right side rib cage and lateral thigh   diltiazem 180 MG 24 hr capsule Commonly  known as: CARDIZEM CD Take 1 capsule (180 mg total) by mouth daily.   dorzolamide-timolol 22.3-6.8 MG/ML ophthalmic solution Commonly known as: COSOPT Place 1 drop into both eyes 2 (two) times daily.   feeding supplement (ENSURE ENLIVE) Liqd Take 237 mLs by mouth 2 (two) times daily between meals.   furosemide 40 MG tablet Commonly known as: LASIX Take 1 tablet (40 mg total) by mouth daily.   insulin aspart 100 UNIT/ML injection Commonly known as: novoLOG Inject 0-15 Units into the skin 3 (three) times daily with meals. What changed:   how much to take  when to take this  additional instructions   insulin glargine 100 UNIT/ML injection Commonly known as: LANTUS Inject 0.1 mLs (10 Units total) into the skin at bedtime.   levothyroxine 75 MCG tablet Commonly known as: SYNTHROID Take 1 tablet (75 mcg total) by mouth daily before breakfast.   metFORMIN 1000 MG tablet Commonly known as: GLUCOPHAGE Take 1 tablet (1,000 mg total) by mouth daily with breakfast.   methocarbamol 500 MG tablet Commonly known as: ROBAXIN Take 1 tablet (500 mg total) by mouth every 6 (six) hours as needed for muscle spasms.   metoprolol succinate 100 MG 24 hr tablet Commonly known as: TOPROL-XL Take 1 tablet (100 mg total) by mouth daily. Take with or immediately following a meal. What changed: when to take this   pantoprazole 40 MG tablet Commonly known as: PROTONIX Take 1 tablet (40 mg total) by mouth daily. What changed: when to take this   polyethylene glycol 17 g packet Commonly known as: MIRALAX / GLYCOLAX Take 17 g by mouth daily as needed for mild constipation. What changed: reasons to take this   potassium chloride 10 MEQ CR capsule Commonly known as: MICRO-K Take 10 mEq by mouth daily.   rivaroxaban 20 MG Tabs tablet Commonly known as: XARELTO Take 1 tablet (20 mg total) by mouth daily with supper.   sennosides 8.8 MG/5ML syrup Commonly known as: SENOKOT Take 10 mLs by  mouth 2 (two) times daily.   travoprost (benzalkonium) 0.004 % ophthalmic solution Commonly known as: TRAVATAN Place 1 drop into both eyes at bedtime.   vitamin B-12 250 MCG tablet Commonly known as: CYANOCOBALAMIN Take 500 mcg by mouth daily.       Allergies  Allergen Reactions  . 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    Consultations:  None   Procedures/Studies: DG Chest 2 View  Result Date: 05/23/2020 CLINICAL DATA:  74 year old female with pneumonia, weakness. EXAM: CHEST - 2 VIEW COMPARISON:  Chest CTA 05/22/2020 and earlier. FINDINGS: AP and lateral views of the chest. Continued evidence of bilateral pleural effusions on the lateral view, small. Mild generalized increased interstitial opacity and mild streaky opacity at the lung bases and along the fissures. No pneumothorax. Stable mild cardiomegaly. Visualized tracheal air column is within normal limits. Multilevel lower thoracic cemented pedicle screws and connecting rods again noted. Osteopenia. No acute osseous abnormality identified. Negative visible bowel gas pattern. IMPRESSION: 1. Small bilateral pleural effusions with atelectasis. 2. Mild generalized increased interstitial opacity which could be interstitial edema or viral/atypical infection. Electronically Signed  By: Genevie Ann M.D.   On: 05/23/2020 08:12   CT Head Wo Contrast  Result Date: 05/26/2020 CLINICAL DATA:  Altered mental status. EXAM: CT HEAD WITHOUT CONTRAST TECHNIQUE: Contiguous axial images were obtained from the base of the skull through the vertex without intravenous contrast. COMPARISON:  Brain CT February 21, 2020. FINDINGS: Brain: Ventricles and sulci are prominent compatible with atrophy. Periventricular and subcortical white matter hypodensities compatible with chronic microvascular ischemic changes. No evidence for acute cortically based infarct, intracranial  hemorrhage, mass lesion or mass-effect. Vascular: Unremarkable Skull: Intact. Sinuses/Orbits: Fluid and mucosal thickening involving the sphenoid sinus. Remainder paranasal sinuses unremarkable. Mastoid air cells unremarkable. Orbits unremarkable. Other: None. IMPRESSION: 1. No acute intracranial process. 2. Atrophy and chronic microvascular ischemic changes. Electronically Signed   By: Lovey Newcomer M.D.   On: 05/26/2020 10:57   CT Chest Wo Contrast  Result Date: 05/26/2020 CLINICAL DATA:  Chest pain and shortness of breath. EXAM: CT CHEST WITHOUT CONTRAST TECHNIQUE: Multidetector CT imaging of the chest was performed following the standard protocol without IV contrast. COMPARISON:  Chest radiograph earlier same day; CT a chest 05/22/2020 FINDINGS: Cardiovascular: Normal heart size. Dilated pulmonary artery measuring 4.1 cm. No pericardial effusion. Mediastinum/Nodes: No axillary adenopathy. Similar-appearing 1.3 cm prevascular node (image 17; series 4). Similar-appearing 10 mm right paratracheal node (image 17; series 4). Normal appearance of the esophagus. Lungs/Pleura: Expiratory phase imaging. Moderate bilateral pleural effusions, increased on the right. Bilateral lower lobe consolidative opacities likely atelectasis. Partial atelectasis involving the perifissural right middle lobe. Interval increase in bilateral upper lobe patchy ground-glass opacities. No pneumothorax. Upper Abdomen: No acute process. Musculoskeletal: Redemonstrated posterior fusion hardware involving the thoracic spine. No aggressive or acute appearing osseous lesions. IMPRESSION: 1. Moderate bilateral pleural effusions, increased on the right. Bilateral lower lobe consolidative opacities likely atelectasis. 2. Interval increase in bilateral upper lobe patchy ground-glass opacities which may represent atypical infectious process or inflammatory process. 3. Dilated pulmonary artery as can be seen with pulmonary arterial hypertension. 4.  Similar-appearing mediastinal adenopathy, likely reactive in etiology. Electronically Signed   By: Lovey Newcomer M.D.   On: 05/26/2020 15:04   CT Angio Chest PE W/Cm &/Or Wo Cm  Result Date: 05/22/2020 CLINICAL DATA:  Chest pain or SOB, pleurisy or effusion suspected EXAM: CT ANGIOGRAPHY CHEST WITH CONTRAST TECHNIQUE: Multidetector CT imaging of the chest was performed using the standard protocol during bolus administration of intravenous contrast. Multiplanar CT image reconstructions and MIPs were obtained to evaluate the vascular anatomy. CONTRAST:  50mL OMNIPAQUE IOHEXOL 350 MG/ML SOLN COMPARISON:  Radiograph earlier this day. FINDINGS: Cardiovascular: There are no filling defects within the pulmonary arteries to suggest pulmonary embolus. Subsegmental branches are not well assessed. Upper normal heart size. Contrast refluxes minimally into the hepatic veins. Aortic atherosclerosis without dissection or acute aortic findings. Conventional branching pattern from the aortic arch. Occasional coronary artery calcifications. No pericardial effusion. Mediastinum/Nodes: Scattered small mediastinal lymph nodes, largest in the prevascular station measuring 11 mm. Shotty bilateral hilar nodes. No axillary adenopathy. No visualized thyroid nodule. No esophageal wall thickening. Lungs/Pleura: Moderate bilateral pleural effusions with adjacent compressive atelectasis. Partial atelectasis involving the perifissural right middle lobe, there is no evidence of central obstructing lesion. Mild patchy and ground-glass opacities in the anterior right upper lobe, series 7, image 37, and occasionally in the left upper lobe, series 7, image 24. Upper Abdomen: Minimal contrast refluxing into the hepatic veins. No other acute findings. Musculoskeletal: Posterior fusion in the lower thoracic spine with  intact hardware. There are no acute or suspicious osseous abnormalities. Review of the MIP images confirms the above findings.  IMPRESSION: 1. No pulmonary embolus. 2. Moderate bilateral pleural effusions with adjacent compressive atelectasis. Partial atelectasis involving the perifissural right middle lobe, no evidence of central obstructing lesion. 3. Mild patchy and ground-glass opacities in the anterior right upper lobe and occasionally in the left upper lobe, likely infectious bronchiolitis or other inflammatory process. Findings are not typical of pulmonary edema. 4. Borderline cardiomegaly. Contrast refluxing into the hepatic veins suggesting elevated right heart pressures. 5. Mildly enlarged prevascular node is likely reactive. Aortic Atherosclerosis (ICD10-I70.0). Electronically Signed   By: Keith Rake M.D.   On: 05/22/2020 17:10   DG Chest Portable 1 View  Result Date: 05/26/2020 CLINICAL DATA:  Patient with hypoxia. EXAM: PORTABLE CHEST 1 VIEW COMPARISON:  Chest radiograph 05/23/2020. FINDINGS: Monitoring leads overlie the patient. Stable cardiac and mediastinal contours. Minimal bibasilar heterogeneous opacities. No pleural effusion or pneumothorax. Thoracic spinal fusion hardware. IMPRESSION: Bibasilar opacities favored to represent atelectasis. Electronically Signed   By: Lovey Newcomer M.D.   On: 05/26/2020 11:01   DG Chest Port 1 View  Result Date: 05/22/2020 CLINICAL DATA:  Shortness of breath. EXAM: PORTABLE CHEST 1 VIEW COMPARISON:  February 28, 2020. FINDINGS: Stable cardiomediastinal silhouette. No pneumothorax is noted. Left lung is clear. Mild right infrahilar opacity is noted which may represent atelectasis or infiltrate. Surgical posterior fusion of lower thoracic spine is noted. IMPRESSION: Mild right infrahilar opacity is noted which may represent atelectasis or infiltrate. Electronically Signed   By: Marijo Conception M.D.   On: 05/22/2020 11:02   DG Swallowing Func-Speech Pathology  Result Date: 05/24/2020 Objective Swallowing Evaluation: Type of Study: MBS-Modified Barium Swallow Study  Patient Details  Name: Crystal Ashley MRN: [id number] Date of Birth: 06-24-1946 Today's Date: 05/24/2020 Time: SLP Start Time (ACUTE ONLY): 1205 -SLP Stop Time (ACUTE ONLY): 1215 SLP Time Calculation (min) (ACUTE ONLY): 10 min Past Medical History: Past Medical History: Diagnosis Date . Atrial fibrillation ([place])  . Cancer ([place])  . Cataract  . Fracture 2001  left ankle . Glaucoma  . Hypertension  . Thyroid disease  Past Surgical History: Past Surgical History: Procedure Laterality Date . APPLICATION OF ROBOTIC ASSISTANCE FOR SPINAL PROCEDURE N/A 11/17/4740  Procedure: APPLICATION OF ROBOTIC ASSISTANCE FOR SPINAL PROCEDURE;  Surgeon: [name], MD;  Location: [facility];  Service: Neurosurgery;  Laterality: N/A; . CARDIOVERSION N/A 04/08/2018  Procedure: CARDIOVERSION;  Surgeon: Adrian Prows, MD;  Location: [facility] ENDOSCOPY;  Service: Cardiovascular;  Laterality: N/A; . CARDIOVERSION N/A 05/22/2019  Procedure: CARDIOVERSION;  Surgeon: Adrian Prows, MD;  Location: [place];  Service: Cardiovascular;  Laterality: N/A; . FOOT SURGERY Left  . HYSTEROSCOPY    POLYPECTOMY . LUMBAR PERCUTANEOUS PEDICLE SCREW 4 LEVEL N/A 02/26/2020  Procedure: Thoracic eight to thoracic twelve posterior percutaneous instrumentation with cement augmentation and bilateral medial facetectomy for decompression at Thoracic ten-eleven;  Surgeon: [name], MD;  Location: [place];  Service: Neurosurgery;  Laterality: N/A; HPI: 74 yo female admitted to [facility] on 10/6 from [place] with PNA. PMH includes afib, uterine cancer, hypothyroidism, fall with pelvic fracture and T10/11 fx s/p fusion 01/2020.  Subjective: alert, upright in chair for procedure Assessment / Plan / Recommendation CHL IP CLINICAL IMPRESSIONS 05/24/2020 Clinical Impression  A mild oropharyngeal dysphagia was exhibited with suspected component of esophageal dysphagia. Oral deficits included reduced bolus cohesion of solid PO, prolonged mastication and mild oral residuals across POs; second  swallows  assisted clearance. Pt with difficulty propeling barium tablet from oral cavity to hypoharynx requiring multiple liquid boluses to assist.  Pharyngeal deficits included isolated instance of reduced timing and efficiency of laryngeal vestibule closure during barium tablet series only allowing for pre and during the swallow penetration of thin liquids. Penetrates spontaneously cleared post swallow. Pt appeared to have bolus retrograde from esophagus to pharynx during barium tablet series with associated coughing, however her larger body habitus limited full viewing of eosphagus and trachea. No aspiration evidenced. Mild vallecular residuals were exhibited with POs secondary to reduced base of tongue retraction, secondary swallows reduced residuals. Upon esophageal sweep 13 mm tablet noted to pass into stomach; distal barium bolus retention was exhibited. Pt may benefit from esophageal workup per MD discretion.  SLP Visit Diagnosis Dysphagia, oropharyngeal phase (R13.12) Attention and concentration deficit following -- Frontal lobe and executive function deficit following -- Impact on safety and function Mild aspiration risk   CHL IP TREATMENT RECOMMENDATION 05/24/2020 Treatment Recommendations Therapy as outlined in treatment plan below   Prognosis 05/24/2020 Prognosis for Safe Diet Advancement Good Barriers to Reach Goals -- Barriers/Prognosis Comment -- CHL IP DIET RECOMMENDATION 05/24/2020 SLP Diet Recommendations Thin liquid;Regular solids Liquid Administration via Cup Medication Administration Crushed with puree Compensations Slow rate;Small sips/bites;Minimize environmental distractions Postural Changes Seated upright at 90 degrees;Remain semi-upright after after feeds/meals (Comment)   CHL IP OTHER RECOMMENDATIONS 05/24/2020 Recommended Consults Consider esophageal assessment Oral Care Recommendations Oral care BID Other Recommendations --   CHL IP FOLLOW UP RECOMMENDATIONS 05/24/2020 Follow up Recommendations  Skilled Nursing facility   [facility] IP FREQUENCY AND DURATION 05/24/2020 Speech Therapy Frequency (ACUTE ONLY) min 1 x/week Treatment Duration 1 week      CHL IP ORAL PHASE 05/24/2020 Oral Phase Impaired Oral - Pudding Teaspoon -- Oral - Pudding Cup -- Oral - Honey Teaspoon -- Oral - Honey Cup -- Oral - Nectar Teaspoon -- Oral - Nectar Cup Delayed oral transit Oral - Nectar Straw -- Oral - Thin Teaspoon -- Oral - Thin Cup Delayed oral transit;Lingual/palatal residue Oral - Thin Straw Lingual/palatal residue;Delayed oral transit Oral - Puree Delayed oral transit;Lingual/palatal residue;Reduced posterior propulsion Oral - Mech Soft -- Oral - Regular Decreased bolus cohesion;Delayed oral transit;Lingual/palatal residue Oral - Multi-Consistency -- Oral - Pill Reduced posterior propulsion;Incomplete tongue to palate contact;Lingual pumping;Delayed oral transit;Lingual/palatal residue Oral Phase - Comment --  CHL IP PHARYNGEAL PHASE 05/24/2020 Pharyngeal Phase Impaired Pharyngeal- Pudding Teaspoon -- Pharyngeal -- Pharyngeal- Pudding Cup -- Pharyngeal -- Pharyngeal- Honey Teaspoon -- Pharyngeal -- Pharyngeal- Honey Cup -- Pharyngeal -- Pharyngeal- Nectar Teaspoon -- Pharyngeal -- Pharyngeal- Nectar Cup Pharyngeal residue - valleculae;Reduced tongue base retraction;Delayed swallow initiation-vallecula Pharyngeal -- Pharyngeal- Nectar Straw -- Pharyngeal -- Pharyngeal- Thin Teaspoon -- Pharyngeal -- Pharyngeal- Thin Cup Delayed swallow initiation-pyriform sinuses;Reduced tongue base retraction;Pharyngeal residue - valleculae Pharyngeal -- Pharyngeal- Thin Straw Delayed swallow initiation-vallecula;Pharyngeal residue - valleculae;Reduced tongue base retraction Pharyngeal -- Pharyngeal- Puree Pharyngeal residue - valleculae;Delayed swallow initiation-vallecula;Reduced tongue base retraction Pharyngeal -- Pharyngeal- Mechanical Soft -- Pharyngeal -- Pharyngeal- Regular Reduced tongue base retraction;Pharyngeal residue -  valleculae;Delayed swallow initiation-vallecula Pharyngeal -- Pharyngeal- Multi-consistency -- Pharyngeal -- Pharyngeal- Pill Reduced epiglottic inversion;Penetration/Aspiration during swallow;Penetration/Aspiration before swallow;Pharyngeal residue - valleculae Pharyngeal Material enters airway, remains ABOVE vocal cords then ejected out;Material enters airway, remains ABOVE vocal cords and not ejected out Pharyngeal Comment --  CHL IP CERVICAL ESOPHAGEAL PHASE 05/24/2020 Cervical Esophageal Phase Impaired Pudding Teaspoon -- Pudding Cup -- Honey Teaspoon -- Honey Cup -- [employer]  Teaspoon -- Nectar Cup -- Nectar Straw -- Thin Teaspoon -- Thin Cup -- Thin Straw -- Puree -- Mechanical Soft -- Regular -- Multi-consistency -- Pill Esophageal backflow into the pharynx Cervical Esophageal Comment distal esophageal barium retention upon esophageal sweep  Chelsea E Hartness MA, CCC-SLP Acute Rehabilitation Services 05/24/2020, 12:57 PM               (Echo, Carotid, EGD, Colonoscopy, ERCP)    Subjective: No new complaints feels great.  Discharge Exam: Vitals:   05/29/20 0442 05/29/20 0729  BP: (!) 102/57 110/65  Pulse: 86   Resp: 18 14  Temp: 97.8 F (36.6 C) 97.9 F (36.6 C)  SpO2: 100% 100%   Vitals:   05/28/20 2044 05/29/20 0000 05/29/20 0442 05/29/20 0729  BP:  (!) 95/58 (!) 102/57 110/65  Pulse:  99 86   Resp: 19 20 18 14   Temp:   97.8 F (36.6 C) 97.9 F (36.6 C)  TempSrc:   Oral Oral  SpO2: 91% 100% 100% 100%  Weight:   84.6 kg   Height:        General: Pt is alert, awake, not in acute distress Cardiovascular: RRR, S1/S2 +, no rubs, no gallops Respiratory: CTA bilaterally, no wheezing, no rhonchi Abdominal: Soft, NT, ND, bowel sounds + Extremities: no edema, no cyanosis    The results of significant diagnostics from this hospitalization (including imaging, microbiology, ancillary and laboratory) are listed below for reference.     Microbiology: Recent Results (from the past  240 hour(s))  Respiratory Panel by RT PCR (Flu A&B, Covid) - Nasopharyngeal Swab     Status: None   Collection Time: 05/22/20 10:49 AM   Specimen: Nasopharyngeal Swab  Result Value Ref Range Status   SARS Coronavirus 2 by RT PCR NEGATIVE NEGATIVE Final    Comment: (NOTE) SARS-CoV-2 target nucleic acids are NOT DETECTED.  The SARS-CoV-2 RNA is generally detectable in upper respiratoy specimens during the acute phase of infection. The lowest concentration of SARS-CoV-2 viral copies this assay can detect is 131 copies/mL. A negative result does not preclude SARS-Cov-2 infection and should not be used as the sole basis for treatment or other patient management decisions. A negative result may occur with  improper specimen collection/handling, submission of specimen other than nasopharyngeal swab, presence of viral mutation(s) within the areas targeted by this assay, and inadequate number of viral copies (<131 copies/mL). A negative result must be combined with clinical observations, patient history, and epidemiological information. The expected result is Negative.  Fact Sheet for Patients:  PinkCheek.be  Fact Sheet for Healthcare Providers:  GravelBags.it  This test is no t yet approved or cleared by the [place] FDA and  has been authorized for detection and/or diagnosis of SARS-CoV-2 by FDA under an Emergency Use Authorization (EUA). This EUA will remain  in effect (meaning this test can be used) for the duration of the COVID-19 declaration under Section 564(b)(1) of the Act, 21 U.S.C. section 360bbb-3(b)(1), unless the authorization is terminated or revoked sooner.     Influenza A by PCR NEGATIVE NEGATIVE Final   Influenza B by PCR NEGATIVE NEGATIVE Final    Comment: (NOTE) The Xpert Xpress SARS-CoV-2/FLU/RSV assay is intended as an aid in  the diagnosis of influenza from Nasopharyngeal swab specimens and  should  not be used as a sole basis for treatment. Nasal washings and  aspirates are unacceptable for Xpert Xpress SARS-CoV-2/FLU/RSV  testing.  Fact Sheet for Patients: PinkCheek.be  Fact Sheet for  Healthcare Providers: GravelBags.it  This test is not yet approved or cleared by the [place] and  has been authorized for detection and/or diagnosis of SARS-CoV-2 by  FDA under an Emergency Use Authorization (EUA). This EUA will remain  in effect (meaning this test can be used) for the duration of the  Covid-19 declaration under Section 564(b)(1) of the Act, 21  U.S.C. section 360bbb-3(b)(1), unless the authorization is  terminated or revoked. Performed at [facility], [place] [address], [place], [place] [address]   SARS Coronavirus 2 by RT PCR (hospital order, performed in Center [facility]) Nasopharyngeal Nasopharyngeal Swab     Status: None   Collection Time: 05/24/20 10:04 AM   Specimen: Nasopharyngeal Swab  Result Value Ref Range Status   SARS Coronavirus 2 NEGATIVE NEGATIVE Final    Comment: (NOTE) SARS-CoV-2 target nucleic acids are NOT DETECTED.  The SARS-CoV-2 RNA is generally detectable in upper and lower respiratory specimens during the acute phase of infection. The lowest concentration of SARS-CoV-2 viral copies this assay can detect is 250 copies / mL. A negative result does not preclude SARS-CoV-2 infection and should not be used as the sole basis for treatment or other patient management decisions.  A negative result may occur with improper specimen collection / handling, submission of specimen other than nasopharyngeal swab, presence of viral mutation(s) within the areas targeted by this assay, and inadequate number of viral copies (<250 copies / mL). A negative result must be combined with clinical observations, patient history, and epidemiological information.  Fact Sheet for Patients:    StrictlyIdeas.no  Fact Sheet for Healthcare Providers: BankingDealers.co.za  This test is not yet approved or  cleared by the [place] FDA and has been authorized for detection and/or diagnosis of SARS-CoV-2 by FDA under an Emergency Use Authorization (EUA).  This EUA will remain in effect (meaning this test can be used) for the duration of the COVID-19 declaration under Section 564(b)(1) of the Act, 21 U.S.C. section 360bbb-3(b)(1), unless the authorization is terminated or revoked sooner.  Performed at [facility], [place] [address], [place], [place] [address]   Respiratory Panel by RT PCR (Flu A&B, Covid) - Nasopharyngeal Swab     Status: None   Collection Time: 05/26/20 10:24 AM   Specimen: Nasopharyngeal Swab  Result Value Ref Range Status   SARS Coronavirus 2 by RT PCR NEGATIVE NEGATIVE Final    Comment: (NOTE) SARS-CoV-2 target nucleic acids are NOT DETECTED.  The SARS-CoV-2 RNA is generally detectable in upper respiratoy specimens during the acute phase of infection. The lowest concentration of SARS-CoV-2 viral copies this assay can detect is 131 copies/mL. A negative result does not preclude SARS-Cov-2 infection and should not be used as the sole basis for treatment or other patient management decisions. A negative result may occur with  improper specimen collection/handling, submission of specimen other than nasopharyngeal swab, presence of viral mutation(s) within the areas targeted by this assay, and inadequate number of viral copies (<131 copies/mL). A negative result must be combined with clinical observations, patient history, and epidemiological information. The expected result is Negative.  Fact Sheet for Patients:  PinkCheek.be  Fact Sheet for Healthcare Providers:  GravelBags.it  This test is no t yet approved or cleared by the [place]  FDA and  has been authorized for detection and/or diagnosis of SARS-CoV-2 by FDA under an Emergency Use Authorization (EUA). This EUA will remain  in effect (meaning this test can be used)  for the duration of the COVID-19 declaration under Section 564(b)(1) of the Act, 21 U.S.C. section 360bbb-3(b)(1), unless the authorization is terminated or revoked sooner.     Influenza A by PCR NEGATIVE NEGATIVE Final   Influenza B by PCR NEGATIVE NEGATIVE Final    Comment: (NOTE) The Xpert Xpress SARS-CoV-2/FLU/RSV assay is intended as an aid in  the diagnosis of influenza from Nasopharyngeal swab specimens and  should not be used as a sole basis for treatment. Nasal washings and  aspirates are unacceptable for Xpert Xpress SARS-CoV-2/FLU/RSV  testing.  Fact Sheet for Patients: PinkCheek.be  Fact Sheet for Healthcare Providers: GravelBags.it  This test is not yet approved or cleared by the [place] FDA and  has been authorized for detection and/or diagnosis of SARS-CoV-2 by  FDA under an Emergency Use Authorization (EUA). This EUA will remain  in effect (meaning this test can be used) for the duration of the  Covid-19 declaration under Section 564(b)(1) of the Act, 21  U.S.C. section 360bbb-3(b)(1), unless the authorization is  terminated or revoked. Performed at [facility], [place] [address] [place], [place] [address]   Blood culture (routine x 2)     Status: None (Preliminary result)   Collection Time: 05/26/20 11:57 AM   Specimen: BLOOD LEFT HAND  Result Value Ref Range Status   Specimen Description BLOOD LEFT HAND  Final   Special Requests   Final    BOTTLES DRAWN AEROBIC AND ANAEROBIC Blood Culture results may not be optimal due to an inadequate volume of blood received in culture bottles   Culture   Final    NO GROWTH 3 DAYS Performed at [facility], [place] [address] [place], [place] [address]    Report  Status PENDING  Incomplete  Blood culture (routine x 2)     Status: None (Preliminary result)   Collection Time: 05/26/20  8:28 PM   Specimen: BLOOD  Result Value Ref Range Status   Specimen Description BLOOD SITE NOT SPECIFIED  Final   Special Requests   Final    BOTTLES DRAWN AEROBIC ONLY Blood Culture results may not be optimal due to an inadequate volume of blood received in culture bottles   Culture   Final    NO GROWTH 3 DAYS Performed at [facility], [place] [address] [place], [place] [address]    Report Status PENDING  Incomplete     Labs: BNP (last 3 results) Recent Labs    02/07/20 0620 05/22/20 1128 05/26/20 1142  BNP 171.1* 270.8* 716.9*   Basic Metabolic Panel: Recent Labs  Lab 05/22/20 2022 05/23/20 0748 05/25/20 0212 05/25/20 0212 05/26/20 1027 05/26/20 1142 05/27/20 0716 05/28/20 0605 05/29/20 0131  NA  --    < > 141   < > 140 141 142 143 139  K  --    < > 4.3   < > 4.5 4.8 4.5 3.3* 3.2*  CL  --    < > 102  --   --  104 101 97* 96*  CO2  --    < > 31  --   --  26 32 35* 34*  GLUCOSE  --    < > 135*  --   --  122* 102* 131* 136*  BUN  --    < > 11  --   --  13 14 12 13   CREATININE  --    < > 0.94  --   --  1.03* 1.13* 1.10* 1.01*  CALCIUM  --    < > 8.4*  --   --  8.5* 8.4* 8.2* 8.6*  MG 2.1  --   --   --   --   --   --   --   --    < > = values in this interval not displayed.   Liver Function Tests: Recent Labs  Lab 05/22/20 1128 05/23/20 0748 05/25/20 0212 05/26/20 1142  AST 17 13* 21 22  ALT 11 9 7 12   ALKPHOS 88 74 70 76  BILITOT 0.6 0.4 0.7 0.5  PROT 6.4* 5.6* 5.9* 6.3*  ALBUMIN 2.5* 2.1* 2.2* 2.5*   No results for input(s): LIPASE, AMYLASE in the last 168 hours. No results for input(s): AMMONIA in the last 168 hours. CBC: Recent Labs  Lab 05/22/20 1128 05/22/20 1128 05/23/20 0748 05/25/20 0212 05/26/20 1027 05/26/20 1142 05/27/20 0716  WBC 12.5*  --  7.3 9.3  --  12.3* 9.1  NEUTROABS 10.4*  --   --  5.9  --  9.7*  --    HGB 9.9*   < > 8.5* 8.3* 9.5* 8.9* 8.0*  HCT 33.1*   < > 30.2* 29.9* 28.0* 32.9* 28.4*  MCV 91.7  --  94.7 94.6  --  98.2 95.6  PLT 382  --  337 359  --  383 364   < > = values in this interval not displayed.   Cardiac Enzymes: No results for input(s): CKTOTAL, CKMB, CKMBINDEX, TROPONINI in the last 168 hours. BNP: Invalid input(s): POCBNP CBG: Recent Labs  Lab 05/28/20 0613 05/28/20 1123 05/28/20 1611 05/28/20 2105 05/29/20 0605  GLUCAP 127* 194* 185* 163* 126*   D-Dimer No results for input(s): DDIMER in the last 72 hours. Hgb A1c Recent Labs    05/26/20 2017  HGBA1C 6.1*   Lipid Profile No results for input(s): CHOL, HDL, LDLCALC, TRIG, CHOLHDL, LDLDIRECT in the last 72 hours. Thyroid function studies Recent Labs    05/26/20 1142  TSH 5.001*   Anemia work up No results for input(s): VITAMINB12, FOLATE, FERRITIN, TIBC, IRON, RETICCTPCT in the last 72 hours. Urinalysis    Component Value Date/Time   COLORURINE YELLOW 02/22/2020 1108   APPEARANCEUR HAZY (A) 02/22/2020 1108   LABSPEC 1.010 02/22/2020 1108   PHURINE 6.0 02/22/2020 1108   GLUCOSEU NEGATIVE 02/22/2020 1108   HGBUR LARGE (A) 02/22/2020 1108   BILIRUBINUR NEGATIVE 02/22/2020 1108   KETONESUR NEGATIVE 02/22/2020 1108   PROTEINUR NEGATIVE 02/22/2020 1108   NITRITE POSITIVE (A) 02/22/2020 1108   LEUKOCYTESUR TRACE (A) 02/22/2020 1108   Sepsis Labs Invalid input(s): PROCALCITONIN,  WBC,  LACTICIDVEN Microbiology Recent Results (from the past 240 hour(s))  Respiratory Panel by RT PCR (Flu A&B, Covid) - Nasopharyngeal Swab     Status: None   Collection Time: 05/22/20 10:49 AM   Specimen: Nasopharyngeal Swab  Result Value Ref Range Status   SARS Coronavirus 2 by RT PCR NEGATIVE NEGATIVE Final    Comment: (NOTE) SARS-CoV-2 target nucleic acids are NOT DETECTED.  The SARS-CoV-2 RNA is generally detectable in upper respiratoy specimens during the acute phase of infection. The lowest concentration  of SARS-CoV-2 viral copies this assay can detect is 131 copies/mL. A negative result does not preclude SARS-Cov-2 infection and should not be used as the sole basis for treatment or other patient management decisions. A negative result may occur with  improper specimen collection/handling, submission of specimen other than nasopharyngeal swab, presence of viral  mutation(s) within the areas targeted by this assay, and inadequate number of viral copies (<131 copies/mL). A negative result must be combined with clinical observations, patient history, and epidemiological information. The expected result is Negative.  Fact Sheet for Patients:  PinkCheek.be  Fact Sheet for Healthcare Providers:  GravelBags.it  This test is no t yet approved or cleared by the [place] FDA and  has been authorized for detection and/or diagnosis of SARS-CoV-2 by FDA under an Emergency Use Authorization (EUA). This EUA will remain  in effect (meaning this test can be used) for the duration of the COVID-19 declaration under Section 564(b)(1) of the Act, 21 U.S.C. section 360bbb-3(b)(1), unless the authorization is terminated or revoked sooner.     Influenza A by PCR NEGATIVE NEGATIVE Final   Influenza B by PCR NEGATIVE NEGATIVE Final    Comment: (NOTE) The Xpert Xpress SARS-CoV-2/FLU/RSV assay is intended as an aid in  the diagnosis of influenza from Nasopharyngeal swab specimens and  should not be used as a sole basis for treatment. Nasal washings and  aspirates are unacceptable for Xpert Xpress SARS-CoV-2/FLU/RSV  testing.  Fact Sheet for Patients: PinkCheek.be  Fact Sheet for Healthcare Providers: GravelBags.it  This test is not yet approved or cleared by the [place] FDA and  has been authorized for detection and/or diagnosis of SARS-CoV-2 by  FDA under an Emergency Use  Authorization (EUA). This EUA will remain  in effect (meaning this test can be used) for the duration of the  Covid-19 declaration under Section 564(b)(1) of the Act, 21  U.S.C. section 360bbb-3(b)(1), unless the authorization is  terminated or revoked. Performed at [facility], [place] [address] [place], [place] [address]   SARS Coronavirus 2 by RT PCR (hospital order, performed in [facility] hospital lab) Nasopharyngeal Nasopharyngeal Swab     Status: None   Collection Time: 05/24/20 10:04 AM   Specimen: Nasopharyngeal Swab  Result Value Ref Range Status   SARS Coronavirus 2 NEGATIVE NEGATIVE Final    Comment: (NOTE) SARS-CoV-2 target nucleic acids are NOT DETECTED.  The SARS-CoV-2 RNA is generally detectable in upper and lower respiratory specimens during the acute phase of infection. The lowest concentration of SARS-CoV-2 viral copies this assay can detect is 250 copies / mL. A negative result does not preclude SARS-CoV-2 infection and should not be used as the sole basis for treatment or other patient management decisions.  A negative result may occur with improper specimen collection / handling, submission of specimen other than nasopharyngeal swab, presence of viral mutation(s) within the areas targeted by this assay, and inadequate number of viral copies (<250 copies / mL). A negative result must be combined with clinical observations, patient history, and epidemiological information.  Fact Sheet for Patients:   StrictlyIdeas.no  Fact Sheet for Healthcare Providers: BankingDealers.co.za  This test is not yet approved or  cleared by the [place] FDA and has been authorized for detection and/or diagnosis of SARS-CoV-2 by FDA under an Emergency Use Authorization (EUA).  This EUA will remain in effect (meaning this test can be used) for the duration of the COVID-19 declaration under Section 564(b)(1) of the Act, 21  U.S.C. section 360bbb-3(b)(1), unless the authorization is terminated or revoked sooner.  Performed at [facility], [place] [address], [place], [place] [address]   Respiratory Panel by RT PCR (Flu A&B, Covid) - Nasopharyngeal Swab     Status: None   Collection Time: 05/26/20 10:24 AM   Specimen: Nasopharyngeal  Swab  Result Value Ref Range Status   SARS Coronavirus 2 by RT PCR NEGATIVE NEGATIVE Final    Comment: (NOTE) SARS-CoV-2 target nucleic acids are NOT DETECTED.  The SARS-CoV-2 RNA is generally detectable in upper respiratoy specimens during the acute phase of infection. The lowest concentration of SARS-CoV-2 viral copies this assay can detect is 131 copies/mL. A negative result does not preclude SARS-Cov-2 infection and should not be used as the sole basis for treatment or other patient management decisions. A negative result may occur with  improper specimen collection/handling, submission of specimen other than nasopharyngeal swab, presence of viral mutation(s) within the areas targeted by this assay, and inadequate number of viral copies (<131 copies/mL). A negative result must be combined with clinical observations, patient history, and epidemiological information. The expected result is Negative.  Fact Sheet for Patients:  PinkCheek.be  Fact Sheet for Healthcare Providers:  GravelBags.it  This test is no t yet approved or cleared by the [place] FDA and  has been authorized for detection and/or diagnosis of SARS-CoV-2 by FDA under an Emergency Use Authorization (EUA). This EUA will remain  in effect (meaning this test can be used) for the duration of the COVID-19 declaration under Section 564(b)(1) of the Act, 21 U.S.C. section 360bbb-3(b)(1), unless the authorization is terminated or revoked sooner.     Influenza A by PCR NEGATIVE NEGATIVE Final   Influenza B by PCR NEGATIVE NEGATIVE Final     Comment: (NOTE) The Xpert Xpress SARS-CoV-2/FLU/RSV assay is intended as an aid in  the diagnosis of influenza from Nasopharyngeal swab specimens and  should not be used as a sole basis for treatment. Nasal washings and  aspirates are unacceptable for Xpert Xpress SARS-CoV-2/FLU/RSV  testing.  Fact Sheet for Patients: PinkCheek.be  Fact Sheet for Healthcare Providers: GravelBags.it  This test is not yet approved or cleared by the [place] FDA and  has been authorized for detection and/or diagnosis of SARS-CoV-2 by  FDA under an Emergency Use Authorization (EUA). This EUA will remain  in effect (meaning this test can be used) for the duration of the  Covid-19 declaration under Section 564(b)(1) of the Act, 21  U.S.C. section 360bbb-3(b)(1), unless the authorization is  terminated or revoked. Performed at [facility], [place] [address], [place], [place] [address]   Blood culture (routine x 2)     Status: None (Preliminary result)   Collection Time: 05/26/20 11:57 AM   Specimen: BLOOD LEFT HAND  Result Value Ref Range Status   Specimen Description BLOOD LEFT HAND  Final   Special Requests   Final    BOTTLES DRAWN AEROBIC AND ANAEROBIC Blood Culture results may not be optimal due to an inadequate volume of blood received in culture bottles   Culture   Final    NO GROWTH 3 DAYS Performed at [facility], [place] [address], [place], [place] [address]    Report Status PENDING  Incomplete  Blood culture (routine x 2)     Status: None (Preliminary result)   Collection Time: 05/26/20  8:28 PM   Specimen: BLOOD  Result Value Ref Range Status   Specimen Description BLOOD SITE NOT SPECIFIED  Final   Special Requests   Final    BOTTLES DRAWN AEROBIC ONLY Blood Culture results may not be optimal due to an inadequate volume of blood received in culture bottles   Culture   Final    NO GROWTH 3 DAYS Performed at [facility], [place] [address] [place], [place] [address]    Report Status PENDING  Incomplete     Time coordinating discharge: Over 30 minutes  SIGNED:   Charlynne Cousins, MD  [facility] 05/29/2020, 8:02 AM Pager   If 7PM-7AM, please contact night-coverage [email or web address] Password [email or web address]

## 2020-05-30 ENCOUNTER — Ambulatory Visit: Payer: Medicare Other | Admitting: Cardiology

## 2020-05-30 DIAGNOSIS — E039 Hypothyroidism, unspecified: Secondary | ICD-10-CM | POA: Diagnosis not present

## 2020-05-30 DIAGNOSIS — R5381 Other malaise: Secondary | ICD-10-CM | POA: Diagnosis not present

## 2020-05-30 DIAGNOSIS — E038 Other specified hypothyroidism: Secondary | ICD-10-CM | POA: Diagnosis not present

## 2020-05-30 DIAGNOSIS — J9601 Acute respiratory failure with hypoxia: Secondary | ICD-10-CM | POA: Diagnosis not present

## 2020-05-30 DIAGNOSIS — R262 Difficulty in walking, not elsewhere classified: Secondary | ICD-10-CM | POA: Diagnosis not present

## 2020-05-30 DIAGNOSIS — I10 Essential (primary) hypertension: Secondary | ICD-10-CM | POA: Diagnosis not present

## 2020-05-30 DIAGNOSIS — M8088XA Other osteoporosis with current pathological fracture, vertebra(e), initial encounter for fracture: Secondary | ICD-10-CM | POA: Diagnosis not present

## 2020-05-30 DIAGNOSIS — M6281 Muscle weakness (generalized): Secondary | ICD-10-CM | POA: Diagnosis not present

## 2020-05-30 DIAGNOSIS — Z794 Long term (current) use of insulin: Secondary | ICD-10-CM | POA: Diagnosis not present

## 2020-05-30 DIAGNOSIS — R1319 Other dysphagia: Secondary | ICD-10-CM | POA: Diagnosis not present

## 2020-05-30 DIAGNOSIS — E876 Hypokalemia: Secondary | ICD-10-CM | POA: Diagnosis not present

## 2020-05-30 DIAGNOSIS — I4891 Unspecified atrial fibrillation: Secondary | ICD-10-CM | POA: Diagnosis not present

## 2020-05-30 DIAGNOSIS — J690 Pneumonitis due to inhalation of food and vomit: Secondary | ICD-10-CM | POA: Diagnosis not present

## 2020-05-30 DIAGNOSIS — E118 Type 2 diabetes mellitus with unspecified complications: Secondary | ICD-10-CM | POA: Diagnosis not present

## 2020-05-30 DIAGNOSIS — I509 Heart failure, unspecified: Secondary | ICD-10-CM | POA: Diagnosis not present

## 2020-05-30 DIAGNOSIS — E119 Type 2 diabetes mellitus without complications: Secondary | ICD-10-CM | POA: Diagnosis not present

## 2020-05-30 DIAGNOSIS — I480 Paroxysmal atrial fibrillation: Secondary | ICD-10-CM | POA: Diagnosis not present

## 2020-05-30 DIAGNOSIS — I5189 Other ill-defined heart diseases: Secondary | ICD-10-CM | POA: Diagnosis not present

## 2020-05-31 DIAGNOSIS — I4891 Unspecified atrial fibrillation: Secondary | ICD-10-CM | POA: Diagnosis not present

## 2020-05-31 DIAGNOSIS — I10 Essential (primary) hypertension: Secondary | ICD-10-CM | POA: Diagnosis not present

## 2020-05-31 DIAGNOSIS — M6281 Muscle weakness (generalized): Secondary | ICD-10-CM | POA: Diagnosis not present

## 2020-05-31 DIAGNOSIS — M8088XA Other osteoporosis with current pathological fracture, vertebra(e), initial encounter for fracture: Secondary | ICD-10-CM | POA: Diagnosis not present

## 2020-05-31 DIAGNOSIS — I509 Heart failure, unspecified: Secondary | ICD-10-CM | POA: Diagnosis not present

## 2020-05-31 DIAGNOSIS — E039 Hypothyroidism, unspecified: Secondary | ICD-10-CM | POA: Diagnosis not present

## 2020-05-31 DIAGNOSIS — E119 Type 2 diabetes mellitus without complications: Secondary | ICD-10-CM | POA: Diagnosis not present

## 2020-05-31 DIAGNOSIS — R262 Difficulty in walking, not elsewhere classified: Secondary | ICD-10-CM | POA: Diagnosis not present

## 2020-05-31 LAB — CULTURE, BLOOD (ROUTINE X 2)
Culture: NO GROWTH
Culture: NO GROWTH

## 2020-06-03 ENCOUNTER — Ambulatory Visit: Payer: Medicare Other | Admitting: Physician Assistant

## 2020-06-05 DIAGNOSIS — S32599D Other specified fracture of unspecified pubis, subsequent encounter for fracture with routine healing: Secondary | ICD-10-CM | POA: Diagnosis not present

## 2020-06-05 DIAGNOSIS — J9601 Acute respiratory failure with hypoxia: Secondary | ICD-10-CM | POA: Diagnosis not present

## 2020-06-05 DIAGNOSIS — S22000D Wedge compression fracture of unspecified thoracic vertebra, subsequent encounter for fracture with routine healing: Secondary | ICD-10-CM | POA: Diagnosis not present

## 2020-06-05 DIAGNOSIS — M810 Age-related osteoporosis without current pathological fracture: Secondary | ICD-10-CM | POA: Diagnosis not present

## 2020-06-05 DIAGNOSIS — I509 Heart failure, unspecified: Secondary | ICD-10-CM | POA: Diagnosis not present

## 2020-06-05 DIAGNOSIS — I10 Essential (primary) hypertension: Secondary | ICD-10-CM | POA: Diagnosis not present

## 2020-06-05 DIAGNOSIS — I4891 Unspecified atrial fibrillation: Secondary | ICD-10-CM | POA: Diagnosis not present

## 2020-06-05 DIAGNOSIS — E039 Hypothyroidism, unspecified: Secondary | ICD-10-CM | POA: Diagnosis not present

## 2020-06-05 DIAGNOSIS — R262 Difficulty in walking, not elsewhere classified: Secondary | ICD-10-CM | POA: Diagnosis not present

## 2020-06-05 DIAGNOSIS — M8088XA Other osteoporosis with current pathological fracture, vertebra(e), initial encounter for fracture: Secondary | ICD-10-CM | POA: Diagnosis not present

## 2020-06-05 DIAGNOSIS — Z794 Long term (current) use of insulin: Secondary | ICD-10-CM | POA: Diagnosis not present

## 2020-06-05 DIAGNOSIS — E118 Type 2 diabetes mellitus with unspecified complications: Secondary | ICD-10-CM | POA: Diagnosis not present

## 2020-06-05 DIAGNOSIS — E876 Hypokalemia: Secondary | ICD-10-CM | POA: Diagnosis not present

## 2020-06-05 DIAGNOSIS — E119 Type 2 diabetes mellitus without complications: Secondary | ICD-10-CM | POA: Diagnosis not present

## 2020-06-05 DIAGNOSIS — I5189 Other ill-defined heart diseases: Secondary | ICD-10-CM | POA: Diagnosis not present

## 2020-06-05 DIAGNOSIS — M6281 Muscle weakness (generalized): Secondary | ICD-10-CM | POA: Diagnosis not present

## 2020-06-05 DIAGNOSIS — S32519D Fracture of superior rim of unspecified pubis, subsequent encounter for fracture with routine healing: Secondary | ICD-10-CM | POA: Diagnosis not present

## 2020-06-05 DIAGNOSIS — I480 Paroxysmal atrial fibrillation: Secondary | ICD-10-CM | POA: Diagnosis not present

## 2020-06-05 DIAGNOSIS — E038 Other specified hypothyroidism: Secondary | ICD-10-CM | POA: Diagnosis not present

## 2020-06-06 ENCOUNTER — Telehealth: Payer: Self-pay

## 2020-06-06 NOTE — Telephone Encounter (Signed)
Crystal Ashley with [facility] called requesting orders to be signed by [name]. Pt is being discharged from SNF, [facility]. Orders are being requested for home health. Requesting PT, OT, and [place]. Please advise.

## 2020-06-07 ENCOUNTER — Telehealth: Payer: Self-pay

## 2020-06-07 NOTE — Telephone Encounter (Signed)
[place] and spoke to [place], she wanted to know if she was our patient and if she had a f/u with [place]. Told her yes she is our pt but is not scheduled for an appt. She said Home Health was ordered in the hospital after pt discharged. Requesting orders for  requesting orders for home health. Requesting PT, OT, and [place]. Verbal orders given for PT, OT and [place] for pt okay per [name], PA. Cindy verbalized understanding and said PT will probably be next week will let us know. Told her that is fine.

## 2020-06-07 NOTE — Telephone Encounter (Signed)
Duplicate

## 2020-06-07 NOTE — Telephone Encounter (Signed)
Patient received a blood sugar meter and the testing strips in rehab and patients son is stating she is need another one

## 2020-06-07 NOTE — Telephone Encounter (Signed)
Please call and get more information

## 2020-06-10 ENCOUNTER — Telehealth: Payer: Self-pay

## 2020-06-10 DIAGNOSIS — M80059D Age-related osteoporosis with current pathological fracture, unspecified femur, subsequent encounter for fracture with routine healing: Secondary | ICD-10-CM | POA: Diagnosis not present

## 2020-06-10 DIAGNOSIS — Z7984 Long term (current) use of oral hypoglycemic drugs: Secondary | ICD-10-CM | POA: Diagnosis not present

## 2020-06-10 DIAGNOSIS — D6489 Other specified anemias: Secondary | ICD-10-CM | POA: Diagnosis not present

## 2020-06-10 DIAGNOSIS — Z794 Long term (current) use of insulin: Secondary | ICD-10-CM | POA: Diagnosis not present

## 2020-06-10 DIAGNOSIS — R1312 Dysphagia, oropharyngeal phase: Secondary | ICD-10-CM | POA: Diagnosis not present

## 2020-06-10 DIAGNOSIS — R32 Unspecified urinary incontinence: Secondary | ICD-10-CM | POA: Diagnosis not present

## 2020-06-10 DIAGNOSIS — E1136 Type 2 diabetes mellitus with diabetic cataract: Secondary | ICD-10-CM | POA: Diagnosis not present

## 2020-06-10 DIAGNOSIS — I5042 Chronic combined systolic (congestive) and diastolic (congestive) heart failure: Secondary | ICD-10-CM | POA: Diagnosis not present

## 2020-06-10 DIAGNOSIS — J189 Pneumonia, unspecified organism: Secondary | ICD-10-CM | POA: Diagnosis not present

## 2020-06-10 DIAGNOSIS — I152 Hypertension secondary to endocrine disorders: Secondary | ICD-10-CM | POA: Diagnosis not present

## 2020-06-10 DIAGNOSIS — J9601 Acute respiratory failure with hypoxia: Secondary | ICD-10-CM | POA: Diagnosis not present

## 2020-06-10 DIAGNOSIS — Z4789 Encounter for other orthopedic aftercare: Secondary | ICD-10-CM | POA: Diagnosis not present

## 2020-06-10 DIAGNOSIS — H409 Unspecified glaucoma: Secondary | ICD-10-CM | POA: Diagnosis not present

## 2020-06-10 DIAGNOSIS — M8008XD Age-related osteoporosis with current pathological fracture, vertebra(e), subsequent encounter for fracture with routine healing: Secondary | ICD-10-CM | POA: Diagnosis not present

## 2020-06-10 DIAGNOSIS — E1165 Type 2 diabetes mellitus with hyperglycemia: Secondary | ICD-10-CM | POA: Diagnosis not present

## 2020-06-10 DIAGNOSIS — Z8542 Personal history of malignant neoplasm of other parts of uterus: Secondary | ICD-10-CM | POA: Diagnosis not present

## 2020-06-10 DIAGNOSIS — J188 Other pneumonia, unspecified organism: Secondary | ICD-10-CM | POA: Diagnosis not present

## 2020-06-10 DIAGNOSIS — E1159 Type 2 diabetes mellitus with other circulatory complications: Secondary | ICD-10-CM | POA: Diagnosis not present

## 2020-06-10 DIAGNOSIS — D649 Anemia, unspecified: Secondary | ICD-10-CM | POA: Diagnosis not present

## 2020-06-10 DIAGNOSIS — E030 Congenital hypothyroidism with diffuse goiter: Secondary | ICD-10-CM | POA: Diagnosis not present

## 2020-06-10 DIAGNOSIS — Z7901 Long term (current) use of anticoagulants: Secondary | ICD-10-CM | POA: Diagnosis not present

## 2020-06-10 MED ORDER — LANCET DEVICE WITH EJECTOR MISC: 1 refills | Status: DC

## 2020-06-10 MED ORDER — LANCETS 33G MISC: 3 refills | Status: DC

## 2020-06-10 NOTE — Addendum Note (Signed)
Addended by: Marian Sorrow on: 06/10/2020 02:42 PM   Modules accepted: Orders

## 2020-06-10 NOTE — Telephone Encounter (Signed)
Spoke to pt's son asked him what glucometer does she have? He said Assure Prism. Told him okay will send in generic lancet device and lancets to the pharmacy. Mr. Marcott verbalized understanding. Rx's sent.

## 2020-06-10 NOTE — Telephone Encounter (Signed)
Noted  

## 2020-06-10 NOTE — Telephone Encounter (Signed)
Clinical Case Management from [facility] called stating pt Start of Care date was today, 06/10/2020.

## 2020-06-10 NOTE — Telephone Encounter (Signed)
Pt son called stating pt was sent home with glucometer and test strips from [facility], but they do not have the equipment to prick pts finger (needle & injector). Son is asking if script can be sent in to pharmacy. Please advise.

## 2020-06-10 NOTE — Telephone Encounter (Signed)
[facility]  Verbal orders request  2x a week for 5 weeks 1x a week for 4 weeks  [phone] Crystal Ashley Ok to leave voicemail

## 2020-06-11 NOTE — Telephone Encounter (Signed)
[facility] called back, Verbal orders given for PT 2 x's a week for 5 weeks, 1 x a week for 4 weeks for pt okay per [name]. Gracee verbalized understanding.

## 2020-06-11 NOTE — Telephone Encounter (Signed)
Left message on voicemail to call office. Did not leave message due to voicemail did not say a name.

## 2020-06-13 DIAGNOSIS — M8008XD Age-related osteoporosis with current pathological fracture, vertebra(e), subsequent encounter for fracture with routine healing: Secondary | ICD-10-CM | POA: Diagnosis not present

## 2020-06-13 DIAGNOSIS — I152 Hypertension secondary to endocrine disorders: Secondary | ICD-10-CM | POA: Diagnosis not present

## 2020-06-13 DIAGNOSIS — E1159 Type 2 diabetes mellitus with other circulatory complications: Secondary | ICD-10-CM | POA: Diagnosis not present

## 2020-06-13 DIAGNOSIS — J9601 Acute respiratory failure with hypoxia: Secondary | ICD-10-CM | POA: Diagnosis not present

## 2020-06-13 DIAGNOSIS — J189 Pneumonia, unspecified organism: Secondary | ICD-10-CM | POA: Diagnosis not present

## 2020-06-13 DIAGNOSIS — M80059D Age-related osteoporosis with current pathological fracture, unspecified femur, subsequent encounter for fracture with routine healing: Secondary | ICD-10-CM | POA: Diagnosis not present

## 2020-06-14 DIAGNOSIS — J9601 Acute respiratory failure with hypoxia: Secondary | ICD-10-CM | POA: Diagnosis not present

## 2020-06-14 DIAGNOSIS — I152 Hypertension secondary to endocrine disorders: Secondary | ICD-10-CM | POA: Diagnosis not present

## 2020-06-14 DIAGNOSIS — E1159 Type 2 diabetes mellitus with other circulatory complications: Secondary | ICD-10-CM | POA: Diagnosis not present

## 2020-06-14 DIAGNOSIS — M8008XD Age-related osteoporosis with current pathological fracture, vertebra(e), subsequent encounter for fracture with routine healing: Secondary | ICD-10-CM | POA: Diagnosis not present

## 2020-06-14 DIAGNOSIS — M80059D Age-related osteoporosis with current pathological fracture, unspecified femur, subsequent encounter for fracture with routine healing: Secondary | ICD-10-CM | POA: Diagnosis not present

## 2020-06-14 DIAGNOSIS — J189 Pneumonia, unspecified organism: Secondary | ICD-10-CM | POA: Diagnosis not present

## 2020-06-17 DIAGNOSIS — J9601 Acute respiratory failure with hypoxia: Secondary | ICD-10-CM | POA: Diagnosis not present

## 2020-06-17 DIAGNOSIS — J189 Pneumonia, unspecified organism: Secondary | ICD-10-CM | POA: Diagnosis not present

## 2020-06-17 DIAGNOSIS — M8008XD Age-related osteoporosis with current pathological fracture, vertebra(e), subsequent encounter for fracture with routine healing: Secondary | ICD-10-CM | POA: Diagnosis not present

## 2020-06-17 DIAGNOSIS — M80059D Age-related osteoporosis with current pathological fracture, unspecified femur, subsequent encounter for fracture with routine healing: Secondary | ICD-10-CM | POA: Diagnosis not present

## 2020-06-17 DIAGNOSIS — E1159 Type 2 diabetes mellitus with other circulatory complications: Secondary | ICD-10-CM | POA: Diagnosis not present

## 2020-06-17 DIAGNOSIS — I152 Hypertension secondary to endocrine disorders: Secondary | ICD-10-CM | POA: Diagnosis not present

## 2020-06-20 DIAGNOSIS — E1159 Type 2 diabetes mellitus with other circulatory complications: Secondary | ICD-10-CM | POA: Diagnosis not present

## 2020-06-20 DIAGNOSIS — J9601 Acute respiratory failure with hypoxia: Secondary | ICD-10-CM | POA: Diagnosis not present

## 2020-06-20 DIAGNOSIS — J189 Pneumonia, unspecified organism: Secondary | ICD-10-CM | POA: Diagnosis not present

## 2020-06-20 DIAGNOSIS — M80059D Age-related osteoporosis with current pathological fracture, unspecified femur, subsequent encounter for fracture with routine healing: Secondary | ICD-10-CM | POA: Diagnosis not present

## 2020-06-20 DIAGNOSIS — I152 Hypertension secondary to endocrine disorders: Secondary | ICD-10-CM | POA: Diagnosis not present

## 2020-06-20 DIAGNOSIS — M8008XD Age-related osteoporosis with current pathological fracture, vertebra(e), subsequent encounter for fracture with routine healing: Secondary | ICD-10-CM | POA: Diagnosis not present

## 2020-06-21 ENCOUNTER — Encounter: Payer: Self-pay | Admitting: Physician Assistant

## 2020-06-21 ENCOUNTER — Other Ambulatory Visit: Payer: Self-pay

## 2020-06-21 ENCOUNTER — Ambulatory Visit (INDEPENDENT_AMBULATORY_CARE_PROVIDER_SITE_OTHER): Payer: Medicare Other | Admitting: Physician Assistant

## 2020-06-21 VITALS — BP 100/70 | HR 118 | Temp 98.0°F

## 2020-06-21 DIAGNOSIS — L89229 Pressure ulcer of left hip, unspecified stage: Secondary | ICD-10-CM

## 2020-06-21 DIAGNOSIS — E119 Type 2 diabetes mellitus without complications: Secondary | ICD-10-CM | POA: Diagnosis not present

## 2020-06-21 DIAGNOSIS — R5381 Other malaise: Secondary | ICD-10-CM

## 2020-06-21 DIAGNOSIS — R030 Elevated blood-pressure reading, without diagnosis of hypertension: Secondary | ICD-10-CM | POA: Diagnosis not present

## 2020-06-21 DIAGNOSIS — S22078D Other fracture of T9-T10 vertebra, subsequent encounter for fracture with routine healing: Secondary | ICD-10-CM | POA: Diagnosis not present

## 2020-06-21 NOTE — Patient Instructions (Addendum)
It was great to see you!  Caltrate 600mg  + D twice daily- please start this.  Please schedule her DEXA (bone density scan) on the way out.  Stop insulin.   Preventing Pressure Injuries A pressure injury, sometimes called a bedsore or a pressure ulcer, is an injury to the skin and underlying tissue caused by pressure. A pressure injury can happen when your skin presses against a surface, such as a mattress or wheelchair seat, for too long. The pressure on the blood vessels causes reduced blood flow to your skin. This can eventually cause the skin tissue to die and break down into a wound. Pressure injuries usually develop:  Over bony parts of the body, such as the tailbone, shoulders, elbows, hips, and heels.  Under medical devices, such as respiratory equipment, stockings, tubes, and splints. How can this condition affect me? Pressure injuries are caused by a lack of blood supply to an area of skin. These injuries begin as a reddened area on the skin and can become an open sore. They can result from intense pressure over a short period of time or from less pressure over a long period of time. Pressure injuries can vary in severity. They can cause pain, muscle damage, and infection. What can increase my risk? This condition is more likely to develop in people who:  Are in the hospital or an extended care facility.  Are bedridden or in a wheelchair.  Have an injury or disease that keeps them from: ? Moving normally. ? Feeling pain or pressure. ? Communicating if they feel pain or pressure.  Have a condition that: ? Makes them sleepy or less alert. ? Causes poor blood flow.  Need to wear a medical device.  Have poor control of their bladder or bowel functions (incontinence).  Have poor nutrition (malnutrition).  Have had this condition before.  Are of certain ethnicities. People of African American, Latino, or Hispanic descent are at higher risk compared to other ethnic  groups. What actions can I take to prevent pressure injuries? Reducing and redistributing pressure  Do not lie or sit in one position for a long time. Move or change position: ? Every hour when out of bed in a chair. ? Every two hours when in bed. ? As often as told by your health care provider.  Use pillows, wedges, or cushions to redistribute pressure. Ask your health care provider to recommend a mattress, cushions, or pads for you.  Use medical devices that do not rub your skin. Tell your health care provider if one of your medical devices is causing pain or irritation. Skin care If you are in the hospital, your health care providers:  Will inspect your skin, including areas under or around medical devices, at least twice a day.  May recommend that you use certain types of bedding to help prevent pressure injuries. These may include a pad, mattress, or chair cushion that is filled with gel, air, water, or foam.  Will evaluate your nutrition and consult a dietitian if needed.  Will inspect and change any wound dressings regularly.  May help you move into different positions every few hours.  Will adjust any medical devices and braces as needed to limit pressure on your skin.  Will keep your skin clean and dry.  May use gentle cleansers and skin protectants if you are incontinent.  Will moisturize any dry skin. In general, at home:  Keep your skin clean and dry. Gently pat your skin dry.  Do  not rub or massage bony areas of your skin.  Moisturize dry skin.  Use gentle cleansers and skin protectants routinely if you are incontinent.  Check your skin at least once a day for any changes in color and for any new blisters or sores. Make sure to check under and around any medical devices and between skin folds. Have a caregiver do this for you if you are not able.  Lifestyle  Be as active as you can every day. Ask your health care provider to suggest safe exercises or  activities.  Do not abuse drugs or alcohol.  Do not use any products that contain nicotine or tobacco, such as cigarettes, e-cigarettes, and chewing tobacco. If you need help quitting, ask your health care provider. General instructions   Take over-the-counter and prescription medicines only as told by your health care provider.  Work with your health care provider to manage any chronic health conditions.  Eat a healthy diet that includes protein, vitamins, and minerals. Ask your health care provider what types of food you should eat.  Drink enough fluid to keep your urine pale yellow.  Keep all follow-up visits as told by your health care provider. This is important. Contact a health care provider if you:  Feel or see any changes in your skin. Summary  A pressure injury, sometimes called a bedsore or a pressure ulcer, is an injury to the skin and underlying tissue caused by pressure.  Do not lie or sit in one position for a long time.  Check your skin at least once a day for any changes in color and for any new blisters or sores.  Make sure to check under and around any medical devices and between skin folds. Have a caregiver do this for you if you are not able.  Eat a healthy diet that includes protein, vitamins, and minerals. Ask your health care provider what types of food you should eat. This information is not intended to replace advice given to you by your health care provider. Make sure you discuss any questions you have with your health care provider. Document Revised: 11/25/2018 Document Reviewed: 04/26/2018 Elsevier Patient Education  [place].

## 2020-06-21 NOTE — Progress Notes (Signed)
Crystal Ashley is a 74 y.o. female is here for follow up.  I acted as a Education administrator for [employer], PA-C Anselmo Pickler, LPN   History of Present Illness:   Chief Complaint  Patient presents with   Follow-up    HPI   Hospital Follow-Up Pt is here for follow up on Pneumonia, pt was admitted on 05/26/20 and discharged on 05/29/20 to SNF. She was admitted for acute respiratory failure with hypoxia secondary to acute decompensated heart failure. She was treated with IV antibiotics and diuresed. She was discharged on 40 mg lasix daily.  She is here with her son. She has been home from SNF for about 8 days now. She has an electric-sit-to-stand chair at home. Has a partial hospital bed and bedside commode. He has ordered a transport chair to help get her into the bathroom.  Has home health aide 3-4 hours a day to help with bathing/dressing.  PT and OT are coming regularly; yesterday they came together. She is very slowly getting her activity level back up.  Diabetes She is currently on 1000 mg metformin daily. She was prescribed lantus and novolog at her SNF, but her son has not been administering this. He has not been checking her blood sugars regularly because he was unable to use her glucometer. Denies: hypoglycemic or hyperglycemic episodes or symptoms.   L hip pressure ulcer Son reports patient has a stage I-II pressure ulcer on L hip. He denies concerns with this. She lays on her L hip at night and he has tried to get her to lay on a different side. He has supplemental cushions for her. He uses barrier cream on this. Denies: fever, chills, surrounding erythema, drainage.  Lab Results  Component Value Date   HGBA1C 6.1 (H) 05/26/2020       Health Maintenance Due  Topic Date Due   URINE MICROALBUMIN  Never done   Fecal DNA (Cologuard)  Never done   FOOT EXAM  06/15/2019    Past Medical History:  Diagnosis Date   Atrial fibrillation ([place])    Cancer ([place])     Cataract    Diabetes mellitus without complication ([place])    Fracture 2001   left ankle   Glaucoma    Hypertension    Thyroid disease      Social History   Tobacco Use   Smoking status: Never Smoker   Smokeless tobacco: Never Used  Vaping Use   Vaping Use: Never used  Substance Use Topics   Alcohol use: No   Drug use: Never    Past Surgical History:  Procedure Laterality Date   APPLICATION OF ROBOTIC ASSISTANCE FOR SPINAL PROCEDURE N/A 02/26/2020   Procedure: APPLICATION OF ROBOTIC ASSISTANCE FOR SPINAL PROCEDURE;  Surgeon: [name], MD;  Location: [facility];  Service: Neurosurgery;  Laterality: N/A;   CARDIOVERSION N/A 04/08/2018   Procedure: CARDIOVERSION;  Surgeon: Adrian Prows, MD;  [facility] ENDOSCOPY;  Service: Cardiovascular;  Laterality: N/A;   CARDIOVERSION N/A 05/22/2019   Procedure: CARDIOVERSION;  Surgeon: Adrian Prows, MD;  Location: [facility];  Service: Cardiovascular;  Laterality: N/A;   FOOT SURGERY Left    HYSTEROSCOPY     POLYPECTOMY   LUMBAR PERCUTANEOUS PEDICLE SCREW 4 LEVEL N/A 02/26/2020   Procedure: Thoracic eight to thoracic twelve posterior percutaneous instrumentation with cement augmentation and bilateral medial facetectomy for decompression at Thoracic ten-eleven;  Surgeon: [name], MD;  Location: [facility];  Service: Neurosurgery;  Laterality: N/A;  Family History  Problem Relation Age of Onset   Diabetes Son    Healthy Mother    Healthy Father     PMHx, SurgHx, SocialHx, FamHx, Medications, and Allergies were reviewed in the Visit Navigator and updated as appropriate.   Patient Active Problem List   Diagnosis Date Noted   Pressure injury of skin 05/27/2020   SOB (shortness of breath) 05/26/2020   PNA (pneumonia) 05/22/2020   Elective surgery    Fusion of spine, thoracolumbar region 02/26/2020   Acute blood loss anemia    PAF (paroxysmal atrial fibrillation) (HCC)    Controlled type 2 diabetes  mellitus with hyperglycemia, without long-term current use of insulin ([place])    Fracture of vertebra due to osteoporosis ([place])    Acute midline thoracic back pain    Multiple traumatic injuries 02/12/2020   Closed fracture of multiple pubic rami, right, initial encounter ([place]) 02/07/2020   Atrial fibrillation, chronic ([place]) 02/07/2020   Chronic combined systolic (congestive) and diastolic (congestive) heart failure ([place])    Acute respiratory failure with hypoxia ([place]) 10/18/2018   Declining mobility 09/23/2018   B12 deficiency 09/23/2018   Morbid obesity with BMI of 40.0-44.9, adult ([place]) 09/23/2018   Anemia 09/23/2018   History of cardioversion 04/26/2018   Type 2 diabetes mellitus without complication, without long-term current use of insulin ([place]) 03/15/2018   Atrial fibrillation with RVR ([place]) 03/15/2018   Morbid obesity ([place]) 01/04/2017   Hyperlipidemia associated with type 2 diabetes mellitus ([place]) 01/04/2017   Vitamin D deficiency 12/15/2016   Glaucoma    Hypertension associated with diabetes ([place]) 04/19/2014   Hypothyroidism 04/19/2014   History of endometrial cancer 03/30/2014    Social History   Tobacco Use   Smoking status: Never Smoker   Smokeless tobacco: Never Used  Vaping Use   Vaping Use: Never used  Substance Use Topics   Alcohol use: No   Drug use: Never    Current Medications and Allergies:    Current Outpatient Medications:    acetaminophen (TYLENOL) 500 MG tablet, Take 1,000 mg by mouth every 8 (eight) hours., Disp: , Rfl:    atorvastatin (LIPITOR) 10 MG tablet, Take 1 tablet (10 mg total) by mouth daily. (Patient taking differently: Take 10 mg by mouth every evening. ), Disp: 90 tablet, Rfl: 3   diltiazem (CARDIZEM CD) 180 MG 24 hr capsule, Take 1 capsule (180 mg total) by mouth daily., Disp: 90 capsule, Rfl: 1   dorzolamide-timolol (COSOPT) 22.3-6.8 MG/ML ophthalmic solution, Place 1 drop into both eyes 2 (two) times  daily. , Disp: , Rfl:    furosemide (LASIX) 40 MG tablet, Take 1 tablet (40 mg total) by mouth daily., Disp: 90 tablet, Rfl: 1   Lancet Devices (LANCET DEVICE WITH EJECTOR) MISC, Use to check blood sugar daily and prn, Disp: 1 each, Rfl: 1   Lancets 33G MISC, Use to check blood sugar daily and prn, Disp: 100 each, Rfl: 3   levothyroxine (SYNTHROID) 75 MCG tablet, Take 1 tablet (75 mcg total) by mouth daily before breakfast., Disp: 90 tablet, Rfl: 3   Lidocaine (ASPERCREME LIDOCAINE) 4 % PTCH, Place 1 patch onto the skin See admin instructions. Apply 1 patch to right mid back and right hip every morning - remove at night, Disp: , Rfl:    Menthol, Topical Analgesic, (BIOFREEZE) 4 % GEL, Apply 1 application topically daily. Apply to right side rib cage and lateral thigh, Disp: , Rfl:    metFORMIN (GLUCOPHAGE) 1000 MG tablet, Take  1 tablet (1,000 mg total) by mouth daily with breakfast., Disp: 90 tablet, Rfl: 3   methocarbamol (ROBAXIN) 500 MG tablet, Take 1 tablet (500 mg total) by mouth every 6 (six) hours as needed for muscle spasms., Disp: 60 tablet, Rfl: 0   metoprolol succinate (TOPROL-XL) 100 MG 24 hr tablet, Take 1 tablet (100 mg total) by mouth daily. Take with or immediately following a meal. (Patient taking differently: Take 100 mg by mouth daily after breakfast. Take with or immediately following a meal.), Disp: 90 tablet, Rfl: 1   pantoprazole (PROTONIX) 40 MG tablet, Take 1 tablet (40 mg total) by mouth daily. (Patient taking differently: Take 40 mg by mouth 2 (two) times daily. ), Disp: 30 tablet, Rfl: 2   polyethylene glycol (MIRALAX / GLYCOLAX) 17 g packet, Take 17 g by mouth daily as needed for mild constipation. (Patient taking differently: Take 17 g by mouth daily as needed (constipation). ), Disp: 14 each, Rfl: 0   potassium chloride (MICRO-K) 10 MEQ CR capsule, Take 10 mEq by mouth daily., Disp: , Rfl:    rivaroxaban (XARELTO) 20 MG TABS tablet, Take 1 tablet (20 mg total)  by mouth daily with supper., Disp: 30 tablet, Rfl: 0   sennosides (SENOKOT) 8.8 MG/5ML syrup, Take 10 mLs by mouth 2 (two) times daily., Disp: , Rfl:    travoprost, benzalkonium, (TRAVATAN) 0.004 % ophthalmic solution, Place 1 drop into both eyes at bedtime. , Disp: , Rfl:    vitamin B-12 (CYANOCOBALAMIN) 250 MCG tablet, Take 500 mcg by mouth daily., Disp: , Rfl:    Allergies  Allergen Reactions   Meat [Alpha-Gal] Other (See Comments)    Pt preference- No meat (beef/chicken/pork/turkey/etc.) with the exception of seafood.    Review of Systems   ROS Negative unless otherwise specified per HPI.  Vitals:   Vitals:   06/21/20 1537  BP: 100/70  Pulse: (!) 118  Temp: 98 F (36.7 C)  TempSrc: Temporal  SpO2: 96%     There is no height or weight on file to calculate BMI.   Physical Exam:    Physical Exam Vitals and nursing note reviewed.  Constitutional:      General: She is not in acute distress.    Appearance: She is well-developed. She is not ill-appearing or toxic-appearing.  Cardiovascular:     Rate and Rhythm: Normal rate and regular rhythm.     Pulses: Normal pulses.     Heart sounds: Normal heart sounds, S1 normal and S2 normal.     Comments: No LE edema Pulmonary:     Effort: Pulmonary effort is normal.     Breath sounds: Normal breath sounds.  Skin:    General: Skin is warm and dry.  Neurological:     Mental Status: She is alert.     GCS: GCS eye subscore is 4. GCS verbal subscore is 5. GCS motor subscore is 6.  Psychiatric:        Speech: Speech normal.        Behavior: Behavior normal. Behavior is cooperative.      Assessment and Plan:    Minie was seen today for follow-up.  Diagnoses and all orders for this visit:  Type 2 diabetes mellitus without complication, without long-term current use of insulin (HCC) Last HgbA1c in hospital was 6.1%. Will update CMP to check renal function. She is not a good candidate for long-term insulin given her  current HgbA1c level. Concern for hypoglycemia with this -- discontinue at this  time. Continue Metformin 1000 mg daily and continue to check blood sugars regularly (patient's son shown how to use in office today.) If blood sugars consistently <70 or >200, or if symptomatic, patient recommended to call our office. -     Comprehensive metabolic panel; Future -     Comprehensive metabolic panel  Pressure injury of skin of left hip, unspecified injury stage Son and patient declined evaluation of this today. Provided information on wound care. Worsening precautions advised.  Physical deconditioning Continue PT and OT per orders.  CMA or LPN served as scribe during this visit. History, Physical, and Plan performed by medical provider. The above documentation has been reviewed and is accurate and complete.   [name], PA-C [place], [place] 06/21/2020  Follow-up: No follow-ups on file.

## 2020-06-22 LAB — COMPREHENSIVE METABOLIC PANEL
AG Ratio: 1.2 (calc) (ref 1.0–2.5)
ALT: 7 U/L (ref 6–29)
AST: 12 U/L (ref 10–35)
Albumin: 3.4 g/dL — ABNORMAL LOW (ref 3.6–5.1)
Alkaline phosphatase (APISO): 77 U/L (ref 37–153)
BUN: 13 mg/dL (ref 7–25)
CO2: 28 mmol/L (ref 20–32)
Calcium: 8.6 mg/dL (ref 8.6–10.4)
Chloride: 100 mmol/L (ref 98–110)
Creat: 0.91 mg/dL (ref 0.60–0.93)
Globulin: 2.9 g/dL (calc) (ref 1.9–3.7)
Glucose, Bld: 159 mg/dL — ABNORMAL HIGH (ref 65–99)
Potassium: 3.8 mmol/L (ref 3.5–5.3)
Sodium: 139 mmol/L (ref 135–146)
Total Bilirubin: 0.6 mg/dL (ref 0.2–1.2)
Total Protein: 6.3 g/dL (ref 6.1–8.1)

## 2020-06-25 ENCOUNTER — Other Ambulatory Visit: Payer: Self-pay

## 2020-06-25 ENCOUNTER — Encounter: Payer: Self-pay | Admitting: Cardiology

## 2020-06-25 ENCOUNTER — Ambulatory Visit: Payer: Medicare Other | Admitting: Cardiology

## 2020-06-25 VITALS — BP 91/45 | HR 61 | Resp 16 | Ht 60.0 in

## 2020-06-25 DIAGNOSIS — E1159 Type 2 diabetes mellitus with other circulatory complications: Secondary | ICD-10-CM | POA: Diagnosis not present

## 2020-06-25 DIAGNOSIS — I10 Essential (primary) hypertension: Secondary | ICD-10-CM | POA: Diagnosis not present

## 2020-06-25 DIAGNOSIS — I5032 Chronic diastolic (congestive) heart failure: Secondary | ICD-10-CM | POA: Diagnosis not present

## 2020-06-25 DIAGNOSIS — M80059D Age-related osteoporosis with current pathological fracture, unspecified femur, subsequent encounter for fracture with routine healing: Secondary | ICD-10-CM | POA: Diagnosis not present

## 2020-06-25 DIAGNOSIS — J189 Pneumonia, unspecified organism: Secondary | ICD-10-CM | POA: Diagnosis not present

## 2020-06-25 DIAGNOSIS — I152 Hypertension secondary to endocrine disorders: Secondary | ICD-10-CM | POA: Diagnosis not present

## 2020-06-25 DIAGNOSIS — J9601 Acute respiratory failure with hypoxia: Secondary | ICD-10-CM | POA: Diagnosis not present

## 2020-06-25 DIAGNOSIS — I4821 Permanent atrial fibrillation: Secondary | ICD-10-CM

## 2020-06-25 DIAGNOSIS — M8008XD Age-related osteoporosis with current pathological fracture, vertebra(e), subsequent encounter for fracture with routine healing: Secondary | ICD-10-CM | POA: Diagnosis not present

## 2020-06-25 MED ORDER — DILTIAZEM HCL ER COATED BEADS 240 MG PO CP24: 240.0000 mg | ORAL_CAPSULE | Freq: Every day | ORAL | 2 refills | Status: DC

## 2020-06-25 NOTE — Progress Notes (Signed)
Primary Physician/Referring:  [name], PA  Patient ID: Crystal Ashley, female    DOB: Apr 25, 1946, 74 y.o.   MRN: [id number]  Chief Complaint  Patient presents with  . Atrial Fibrillation  . Follow-up    6 week   HPI:    Crystal Ashley  is a 74 y.o. [place] female with type 2 diabetes, hyperlipidemia, hypertension, hypothyroidism, history of endometrial cancer in 2015, permanent atrial fibrillation on Xarelto history of uterine cancer status post hysterectomy, with poor functional status since pelvic fracture in June 2021 was discharged to rehab, has had inpatient and outpatient rehab, also underwent fusion of T10 and T11 fractures after the fall on 02/26/2020, last admission to the hospital on 05/26/2020 with bilateral pneumonia.  She now presents to the office for follow-up of permanent atrial fibrillation, chronic diastolic heart failure and dyspnea on exertion.  She is presently undergoing home health physical therapy.  Has essentially been wheelchair-bound.  Denies dyspnea or chest pain or palpitations or leg edema.  Accompanied by her son.   Past Medical History:  Diagnosis Date  . Atrial fibrillation ([place])   . Cancer ([place])   . Cataract   . Diabetes mellitus without complication ([place])   . Fracture 2001   left ankle  . Glaucoma   . Hypertension   . Thyroid disease    Past Surgical History:  Procedure Laterality Date  . APPLICATION OF ROBOTIC ASSISTANCE FOR SPINAL PROCEDURE N/A 02/26/2020   Procedure: APPLICATION OF ROBOTIC ASSISTANCE FOR SPINAL PROCEDURE;  Surgeon: [name], MD;  Location: [facility];  Service: Neurosurgery;  Laterality: N/A;  . CARDIOVERSION N/A 04/08/2018   Procedure: CARDIOVERSION;  Surgeon: Adrian Prows, MD;  Location[facility] ENDOSCOPY;  Service: Cardiovascular;  Laterality: N/A;  . CARDIOVERSION N/A 05/22/2019   Procedure: CARDIOVERSION;  Surgeon: Adrian Prows, MD;  Location: [facility];  Service: Cardiovascular;  Laterality: N/A;  .  FOOT SURGERY Left   . HYSTEROSCOPY     POLYPECTOMY  . LUMBAR PERCUTANEOUS PEDICLE SCREW 4 LEVEL N/A 02/26/2020   Procedure: Thoracic eight to thoracic twelve posterior percutaneous instrumentation with cement augmentation and bilateral medial facetectomy for decompression at Thoracic ten-eleven;  Surgeon: [name], MD;  Location: [facility];  Service: Neurosurgery;  Laterality: N/A;   Social History   Tobacco Use  . Smoking status: Never Smoker  . Smokeless tobacco: Never Used  Substance Use Topics  . Alcohol use: No   Marital Status: Married   ROS  Review of Systems  Cardiovascular: Negative for chest pain, dyspnea on exertion and leg swelling.  Musculoskeletal: Positive for arthritis, back pain and muscle weakness.  Gastrointestinal: Negative for melena.   Objective    Vitals with BMI 06/25/2020 06/21/2020 05/29/2020  Height 5\' 0"  - -  Weight - (No Data) -  BMI - - -  Systolic 91 563 149  Diastolic 45 70 81  Pulse 61 118 94    Physical Exam Constitutional:      General: She is not in acute distress.    Appearance: She is obese.     Comments: Moderately built and moderately obese in no acute distress.  Neck:     Thyroid: No thyromegaly.     Vascular: No JVD.  Cardiovascular:     Rate and Rhythm: Normal rate. Rhythm irregular.     Pulses: Normal pulses and intact distal pulses.     Heart sounds: No murmur heard.  No gallop. No S3 or S4 sounds.  Comments: S1 is variable, S2 is normal.  No leg edema.  Pulmonary:     Effort: Pulmonary effort is normal.     Breath sounds: Normal breath sounds.  Abdominal:     General: Bowel sounds are normal.     Palpations: Abdomen is soft.  Musculoskeletal:     Cervical back: Normal range of motion.  Neurological:     Mental Status: She is alert.    Laboratory examination:   Recent Labs    02/19/20 0659 02/19/20 0659 02/25/20 0830 02/26/20 0839 02/28/20 1508 05/22/20 1128 05/27/20 0716 05/27/20 0716  05/28/20 0605 05/29/20 0131 06/21/20 1617  NA 141  --   --    < > 139   < > 142   < > 143 139 139  K 3.8  --   --    < > 3.7   < > 4.5   < > 3.3* 3.2* 3.8  CL 103   < >  --   --  102   < > 101   < > 97* 96* 100  CO2 28   < >  --   --  27   < > 32   < > 35* 34* 28  GLUCOSE 146*   < >  --   --  275*   < > 102*   < > 131* 136* 159*  BUN 14   < >  --   --  15   < > 14   < > 12 13 13   CREATININE 0.93   < > 0.85  --  0.81   < > 1.13*   < > 1.10* 1.01* 0.91  CALCIUM 8.5*   < >  --   --  8.2*   < > 8.4*   < > 8.2* 8.6* 8.6  GFRNONAA >60   < > >60  --  >60   < > 48*  --  49* 55*  --   GFRAA >60  --  >60  --  >60  --   --   --   --   --   --    < > = values in this interval not displayed.   CMP Latest Ref Rng & Units 06/21/2020 05/29/2020 05/28/2020  Glucose 65 - 99 mg/dL 159(H) 136(H) 131(H)  BUN 7 - 25 mg/dL 13 13 12   Creatinine 0.60 - 0.93 mg/dL 0.91 1.01(H) 1.10(H)  Sodium 135 - 146 mmol/L 139 139 143  Potassium 3.5 - 5.3 mmol/L 3.8 3.2(L) 3.3(L)  Chloride 98 - 110 mmol/L 100 96(L) 97(L)  CO2 20 - 32 mmol/L 28 34(H) 35(H)  Calcium 8.6 - 10.4 mg/dL 8.6 8.6(L) 8.2(L)  Total Protein 6.1 - 8.1 g/dL 6.3 - -  Total Bilirubin 0.2 - 1.2 mg/dL 0.6 - -  Alkaline Phos 38 - 126 U/L - - -  AST 10 - 35 U/L 12 - -  ALT 6 - 29 U/L 7 - -   CBC Latest Ref Rng & Units 05/27/2020 05/26/2020 05/26/2020  WBC 4.0 - 10.5 K/uL 9.1 12.3(H) -  Hemoglobin 12.0 - 15.0 g/dL 8.0(L) 8.9(L) 9.5(L)  Hematocrit 36 - 46 % 28.4(L) 32.9(L) 28.0(L)  Platelets 150 - 400 K/uL 364 383 -   Lipid Panel     Component Value Date/Time   CHOL 129 11/29/2019 0944   TRIG 142.0 11/29/2019 0944   HDL 38.00 (L) 11/29/2019 0944   CHOLHDL 3 11/29/2019 0944   VLDL 28.4 11/29/2019 0944  LDLCALC 63 11/29/2019 0944   HEMOGLOBIN A1C Lab Results  Component Value Date   HGBA1C 6.1 (H) 05/26/2020   MPG 128.37 05/26/2020   TSH Recent Labs    11/29/19 0944 01/31/20 1701 05/26/20 1142  TSH 1.69 1.231 5.001*   Medications     Current Outpatient Medications  Medication Instructions  . acetaminophen (TYLENOL) 1,000 mg, Oral, Every 8 hours  . atorvastatin (LIPITOR) 10 mg, Oral, Daily  . diltiazem (CARDIZEM CD) 240 mg, Oral, Daily  . dorzolamide-timolol (COSOPT) 22.3-6.8 MG/ML ophthalmic solution 1 drop, Both Eyes, 2 times daily  . furosemide (LASIX) 40 mg, Oral, Daily  . Lancet Devices (LANCET DEVICE WITH EJECTOR) MISC Use to check blood sugar daily and prn  . Lancets 33G MISC Use to check blood sugar daily and prn  . levothyroxine (SYNTHROID) 75 mcg, Oral, Daily before breakfast  . Lidocaine (ASPERCREME LIDOCAINE) 4 % PTCH 1 patch, Transdermal, See admin instructions, Apply 1 patch to right mid back and right hip every morning - remove at night  . Menthol, Topical Analgesic, (BIOFREEZE) 4 % GEL 1 application, Apply externally, Daily, Apply to right side rib cage and lateral thigh  . metFORMIN (GLUCOPHAGE) 1,000 mg, Oral, Daily with breakfast  . methocarbamol (ROBAXIN) 500 mg, Oral, Every 6 hours PRN  . pantoprazole (PROTONIX) 40 mg, Oral, Daily  . polyethylene glycol (MIRALAX / GLYCOLAX) 17 g, Oral, Daily PRN  . potassium chloride (MICRO-K) 10 MEQ CR capsule 10 mEq, Oral, Daily  . rivaroxaban (XARELTO) 20 mg, Oral, Daily with supper  . sennosides (SENOKOT) 8.8 MG/5ML syrup 10 mLs, Oral, 2 times daily  . travoprost, benzalkonium, (TRAVATAN) 0.004 % ophthalmic solution 1 drop, Both Eyes, Daily at bedtime  . vitamin B-12 (CYANOCOBALAMIN) 500 mcg, Oral, Daily    Cardiac Studies:   Lexiscan myoview stress test 04/01/2018: 1. Lexiscan stress test was performed. Exercise capacity was not assessed. No stress symptoms reported. Normal blood pressure. The resting and stress electrocardiogram demonstrated atrial fibrillation with rapid ventricular rate, low voltage, and normal rest repolarization. Stress EKG is non diagnostic for ischemia as it is a pharmacologic stress. 2. The overall quality of the study is fair.  Review of the raw data in a rotational cine format reveals breast attenuation, with imaging performed in sitting position. Gated SPECT images reveal normal myocardial thickening and wall motion. The left ventricular ejection fraction was calculated or visually estimated to be 47%. REST and STRESS images demonstrate decreased tracer uptake in the mid inferoseptal, mid inferior, apical septal and apical inferior segments of the left ventricle, worse on rest images. While defect likely represents breast attenuation, ischemia in this region cannot be excluded. Clinical correlation recommended. 3. Low to intermediate risk study.  Echocardiogram 10/19/2018 :   1. The left ventricle has normal systolic function, with an ejection fraction of 55-60%. The cavity size was normal. Left ventricular diastolic Doppler parameters are indeterminate No evidence of left ventricular regional wall motion abnormalities.  2. Left atrial size was moderately dilated.  3. The mitral valve is degenerative. Mild thickening of the mitral valve leaflet. Mild calcification of the mitral valve leaflet.  4. The tricuspid valve is normal in structure.  5. The aortic valve is tricuspid Mild calcification of the aortic valve.  6. Pulmonary hypertension is mild. PA pressure 40 mm Hg (CVP 15)  EKG:  EKG 06/25/2020: Atrial fibrillation with controlled ventricular response at the rate of 70 bpm, normal axis, T wave abnormality, anteroseptal ischemia.  EKG 01/29/2020: Atrial fibrillation with  rapid ventricular response at rate of 111 bpm, normal axis, nonspecific T wave abnormality.  Low-voltage complexes.  Compared to 05/31/2019, rapid ventricular response is new.  Assessment     ICD-10-CM   1. Permanent atrial fibrillation (HCC)  I48.21 EKG 12-Lead  2. Chronic diastolic (congestive) heart failure (HCC)  I50.32   3. Essential hypertension  I10          Recommendations:   Crystal Ashley  is a 74 y.o. [place] female  with type 2 diabetes, hyperlipidemia, hypertension, hypothyroidism, history of endometrial cancer in 2015, permanent atrial fibrillation on Xarelto history of uterine cancer status post hysterectomy, with poor functional status since pelvic fracture in June 2021 was discharged to rehab, has had inpatient and outpatient rehab, also underwent fusion of T10 and T11 fractures after the fall on 02/26/2020, last admission to the hospital on 05/26/2020 with bilateral pneumonia.  She now presents to the office for follow-up of permanent atrial fibrillation, chronic diastolic heart failure and dyspnea on exertion.  Her main complaint is hair loss, could have been related to underlying stress from recent multiple medical comorbidity and also recent multiple hospitalization and deconditioning.  However beta-blocker could be contributing as well.  We will discontinue metoprolol succinate.  Although patient was supposed to be on long-acting diltiazem, she is presently taking diltiazem 60 mg and instead of taking it 3 times a day just once a day.  We will change it to diltiazem CD 240 mg daily.  Otherwise blood pressure is well controlled today, atrial fibrillation is rate controlled, no clinical evidence of acute decompensated heart failure.  I would like to see her back in 1 month for follow-up of A. fib and hypertension as have made changes to her medications.   Adrian Prows, MD, [facility] 06/25/2020, 5:29 PM Office: [phone] Pager: [phone]

## 2020-06-26 DIAGNOSIS — J189 Pneumonia, unspecified organism: Secondary | ICD-10-CM | POA: Diagnosis not present

## 2020-06-26 DIAGNOSIS — E1159 Type 2 diabetes mellitus with other circulatory complications: Secondary | ICD-10-CM | POA: Diagnosis not present

## 2020-06-26 DIAGNOSIS — M80059D Age-related osteoporosis with current pathological fracture, unspecified femur, subsequent encounter for fracture with routine healing: Secondary | ICD-10-CM | POA: Diagnosis not present

## 2020-06-26 DIAGNOSIS — M8008XD Age-related osteoporosis with current pathological fracture, vertebra(e), subsequent encounter for fracture with routine healing: Secondary | ICD-10-CM | POA: Diagnosis not present

## 2020-06-26 DIAGNOSIS — I152 Hypertension secondary to endocrine disorders: Secondary | ICD-10-CM | POA: Diagnosis not present

## 2020-06-26 DIAGNOSIS — J9601 Acute respiratory failure with hypoxia: Secondary | ICD-10-CM | POA: Diagnosis not present

## 2020-06-28 DIAGNOSIS — E1159 Type 2 diabetes mellitus with other circulatory complications: Secondary | ICD-10-CM | POA: Diagnosis not present

## 2020-06-28 DIAGNOSIS — M8008XD Age-related osteoporosis with current pathological fracture, vertebra(e), subsequent encounter for fracture with routine healing: Secondary | ICD-10-CM | POA: Diagnosis not present

## 2020-06-28 DIAGNOSIS — J9601 Acute respiratory failure with hypoxia: Secondary | ICD-10-CM | POA: Diagnosis not present

## 2020-06-28 DIAGNOSIS — M80059D Age-related osteoporosis with current pathological fracture, unspecified femur, subsequent encounter for fracture with routine healing: Secondary | ICD-10-CM | POA: Diagnosis not present

## 2020-06-28 DIAGNOSIS — I152 Hypertension secondary to endocrine disorders: Secondary | ICD-10-CM | POA: Diagnosis not present

## 2020-06-28 DIAGNOSIS — J189 Pneumonia, unspecified organism: Secondary | ICD-10-CM | POA: Diagnosis not present

## 2020-06-29 DIAGNOSIS — J9601 Acute respiratory failure with hypoxia: Secondary | ICD-10-CM | POA: Diagnosis not present

## 2020-06-29 DIAGNOSIS — M80059D Age-related osteoporosis with current pathological fracture, unspecified femur, subsequent encounter for fracture with routine healing: Secondary | ICD-10-CM | POA: Diagnosis not present

## 2020-06-29 DIAGNOSIS — I152 Hypertension secondary to endocrine disorders: Secondary | ICD-10-CM | POA: Diagnosis not present

## 2020-06-29 DIAGNOSIS — E1159 Type 2 diabetes mellitus with other circulatory complications: Secondary | ICD-10-CM | POA: Diagnosis not present

## 2020-06-29 DIAGNOSIS — J189 Pneumonia, unspecified organism: Secondary | ICD-10-CM | POA: Diagnosis not present

## 2020-06-29 DIAGNOSIS — M8008XD Age-related osteoporosis with current pathological fracture, vertebra(e), subsequent encounter for fracture with routine healing: Secondary | ICD-10-CM | POA: Diagnosis not present

## 2020-07-02 DIAGNOSIS — I152 Hypertension secondary to endocrine disorders: Secondary | ICD-10-CM | POA: Diagnosis not present

## 2020-07-02 DIAGNOSIS — E1159 Type 2 diabetes mellitus with other circulatory complications: Secondary | ICD-10-CM | POA: Diagnosis not present

## 2020-07-02 DIAGNOSIS — M80059D Age-related osteoporosis with current pathological fracture, unspecified femur, subsequent encounter for fracture with routine healing: Secondary | ICD-10-CM | POA: Diagnosis not present

## 2020-07-02 DIAGNOSIS — M8008XD Age-related osteoporosis with current pathological fracture, vertebra(e), subsequent encounter for fracture with routine healing: Secondary | ICD-10-CM | POA: Diagnosis not present

## 2020-07-02 DIAGNOSIS — J189 Pneumonia, unspecified organism: Secondary | ICD-10-CM | POA: Diagnosis not present

## 2020-07-02 DIAGNOSIS — J9601 Acute respiratory failure with hypoxia: Secondary | ICD-10-CM | POA: Diagnosis not present

## 2020-07-03 ENCOUNTER — Telehealth: Payer: Self-pay

## 2020-07-03 DIAGNOSIS — I152 Hypertension secondary to endocrine disorders: Secondary | ICD-10-CM | POA: Diagnosis not present

## 2020-07-03 DIAGNOSIS — J9601 Acute respiratory failure with hypoxia: Secondary | ICD-10-CM | POA: Diagnosis not present

## 2020-07-03 DIAGNOSIS — J189 Pneumonia, unspecified organism: Secondary | ICD-10-CM | POA: Diagnosis not present

## 2020-07-03 DIAGNOSIS — M80059D Age-related osteoporosis with current pathological fracture, unspecified femur, subsequent encounter for fracture with routine healing: Secondary | ICD-10-CM | POA: Diagnosis not present

## 2020-07-03 DIAGNOSIS — M8008XD Age-related osteoporosis with current pathological fracture, vertebra(e), subsequent encounter for fracture with routine healing: Secondary | ICD-10-CM | POA: Diagnosis not present

## 2020-07-03 DIAGNOSIS — E1159 Type 2 diabetes mellitus with other circulatory complications: Secondary | ICD-10-CM | POA: Diagnosis not present

## 2020-07-03 NOTE — Telephone Encounter (Signed)
   11/15 pt slid off bed at 8:20 am. Son could not pick her back up on the bed, so EMS was called and assisted. There were no injuries and no pain.    Info relayed to our office by Clare Gandy with [facility] Physical Therapist Assistant.  [phone]

## 2020-07-03 NOTE — Telephone Encounter (Signed)
FYI, see message. 

## 2020-07-05 ENCOUNTER — Other Ambulatory Visit: Payer: Self-pay

## 2020-07-05 DIAGNOSIS — M80059D Age-related osteoporosis with current pathological fracture, unspecified femur, subsequent encounter for fracture with routine healing: Secondary | ICD-10-CM | POA: Diagnosis not present

## 2020-07-05 DIAGNOSIS — M8008XD Age-related osteoporosis with current pathological fracture, vertebra(e), subsequent encounter for fracture with routine healing: Secondary | ICD-10-CM | POA: Diagnosis not present

## 2020-07-05 DIAGNOSIS — E1159 Type 2 diabetes mellitus with other circulatory complications: Secondary | ICD-10-CM | POA: Diagnosis not present

## 2020-07-05 DIAGNOSIS — I152 Hypertension secondary to endocrine disorders: Secondary | ICD-10-CM | POA: Diagnosis not present

## 2020-07-05 DIAGNOSIS — J189 Pneumonia, unspecified organism: Secondary | ICD-10-CM | POA: Diagnosis not present

## 2020-07-05 DIAGNOSIS — J9601 Acute respiratory failure with hypoxia: Secondary | ICD-10-CM | POA: Diagnosis not present

## 2020-07-05 MED ORDER — RIVAROXABAN 20 MG PO TABS
20.0000 mg | ORAL_TABLET | Freq: Every day | ORAL | 1 refills | Status: DC
Start: 2020-07-05 — End: 2021-01-02

## 2020-07-08 DIAGNOSIS — E1159 Type 2 diabetes mellitus with other circulatory complications: Secondary | ICD-10-CM | POA: Diagnosis not present

## 2020-07-08 DIAGNOSIS — I152 Hypertension secondary to endocrine disorders: Secondary | ICD-10-CM | POA: Diagnosis not present

## 2020-07-08 DIAGNOSIS — J9601 Acute respiratory failure with hypoxia: Secondary | ICD-10-CM | POA: Diagnosis not present

## 2020-07-08 DIAGNOSIS — M8008XD Age-related osteoporosis with current pathological fracture, vertebra(e), subsequent encounter for fracture with routine healing: Secondary | ICD-10-CM | POA: Diagnosis not present

## 2020-07-08 DIAGNOSIS — J189 Pneumonia, unspecified organism: Secondary | ICD-10-CM | POA: Diagnosis not present

## 2020-07-08 DIAGNOSIS — M80059D Age-related osteoporosis with current pathological fracture, unspecified femur, subsequent encounter for fracture with routine healing: Secondary | ICD-10-CM | POA: Diagnosis not present

## 2020-07-09 DIAGNOSIS — I152 Hypertension secondary to endocrine disorders: Secondary | ICD-10-CM | POA: Diagnosis not present

## 2020-07-09 DIAGNOSIS — M80059D Age-related osteoporosis with current pathological fracture, unspecified femur, subsequent encounter for fracture with routine healing: Secondary | ICD-10-CM | POA: Diagnosis not present

## 2020-07-09 DIAGNOSIS — M8008XD Age-related osteoporosis with current pathological fracture, vertebra(e), subsequent encounter for fracture with routine healing: Secondary | ICD-10-CM | POA: Diagnosis not present

## 2020-07-09 DIAGNOSIS — J189 Pneumonia, unspecified organism: Secondary | ICD-10-CM | POA: Diagnosis not present

## 2020-07-09 DIAGNOSIS — E1159 Type 2 diabetes mellitus with other circulatory complications: Secondary | ICD-10-CM | POA: Diagnosis not present

## 2020-07-09 DIAGNOSIS — J9601 Acute respiratory failure with hypoxia: Secondary | ICD-10-CM | POA: Diagnosis not present

## 2020-07-10 DIAGNOSIS — H409 Unspecified glaucoma: Secondary | ICD-10-CM | POA: Diagnosis not present

## 2020-07-10 DIAGNOSIS — I152 Hypertension secondary to endocrine disorders: Secondary | ICD-10-CM | POA: Diagnosis not present

## 2020-07-10 DIAGNOSIS — D649 Anemia, unspecified: Secondary | ICD-10-CM | POA: Diagnosis not present

## 2020-07-10 DIAGNOSIS — J189 Pneumonia, unspecified organism: Secondary | ICD-10-CM | POA: Diagnosis not present

## 2020-07-10 DIAGNOSIS — M80059D Age-related osteoporosis with current pathological fracture, unspecified femur, subsequent encounter for fracture with routine healing: Secondary | ICD-10-CM | POA: Diagnosis not present

## 2020-07-10 DIAGNOSIS — J9601 Acute respiratory failure with hypoxia: Secondary | ICD-10-CM | POA: Diagnosis not present

## 2020-07-10 DIAGNOSIS — Z7901 Long term (current) use of anticoagulants: Secondary | ICD-10-CM | POA: Diagnosis not present

## 2020-07-10 DIAGNOSIS — M8008XD Age-related osteoporosis with current pathological fracture, vertebra(e), subsequent encounter for fracture with routine healing: Secondary | ICD-10-CM | POA: Diagnosis not present

## 2020-07-10 DIAGNOSIS — E1136 Type 2 diabetes mellitus with diabetic cataract: Secondary | ICD-10-CM | POA: Diagnosis not present

## 2020-07-10 DIAGNOSIS — I5042 Chronic combined systolic (congestive) and diastolic (congestive) heart failure: Secondary | ICD-10-CM | POA: Diagnosis not present

## 2020-07-10 DIAGNOSIS — Z4789 Encounter for other orthopedic aftercare: Secondary | ICD-10-CM | POA: Diagnosis not present

## 2020-07-10 DIAGNOSIS — E1159 Type 2 diabetes mellitus with other circulatory complications: Secondary | ICD-10-CM | POA: Diagnosis not present

## 2020-07-10 DIAGNOSIS — Z7984 Long term (current) use of oral hypoglycemic drugs: Secondary | ICD-10-CM | POA: Diagnosis not present

## 2020-07-10 DIAGNOSIS — Z794 Long term (current) use of insulin: Secondary | ICD-10-CM | POA: Diagnosis not present

## 2020-07-10 DIAGNOSIS — E1165 Type 2 diabetes mellitus with hyperglycemia: Secondary | ICD-10-CM | POA: Diagnosis not present

## 2020-07-10 DIAGNOSIS — R1312 Dysphagia, oropharyngeal phase: Secondary | ICD-10-CM | POA: Diagnosis not present

## 2020-07-10 DIAGNOSIS — E030 Congenital hypothyroidism with diffuse goiter: Secondary | ICD-10-CM | POA: Diagnosis not present

## 2020-07-10 DIAGNOSIS — R32 Unspecified urinary incontinence: Secondary | ICD-10-CM | POA: Diagnosis not present

## 2020-07-10 DIAGNOSIS — Z8542 Personal history of malignant neoplasm of other parts of uterus: Secondary | ICD-10-CM | POA: Diagnosis not present

## 2020-07-16 DIAGNOSIS — M80059D Age-related osteoporosis with current pathological fracture, unspecified femur, subsequent encounter for fracture with routine healing: Secondary | ICD-10-CM | POA: Diagnosis not present

## 2020-07-16 DIAGNOSIS — J189 Pneumonia, unspecified organism: Secondary | ICD-10-CM | POA: Diagnosis not present

## 2020-07-16 DIAGNOSIS — J9601 Acute respiratory failure with hypoxia: Secondary | ICD-10-CM | POA: Diagnosis not present

## 2020-07-16 DIAGNOSIS — I152 Hypertension secondary to endocrine disorders: Secondary | ICD-10-CM | POA: Diagnosis not present

## 2020-07-16 DIAGNOSIS — M8008XD Age-related osteoporosis with current pathological fracture, vertebra(e), subsequent encounter for fracture with routine healing: Secondary | ICD-10-CM | POA: Diagnosis not present

## 2020-07-16 DIAGNOSIS — E1159 Type 2 diabetes mellitus with other circulatory complications: Secondary | ICD-10-CM | POA: Diagnosis not present

## 2020-07-17 DIAGNOSIS — J9601 Acute respiratory failure with hypoxia: Secondary | ICD-10-CM | POA: Diagnosis not present

## 2020-07-17 DIAGNOSIS — I152 Hypertension secondary to endocrine disorders: Secondary | ICD-10-CM | POA: Diagnosis not present

## 2020-07-17 DIAGNOSIS — M8008XD Age-related osteoporosis with current pathological fracture, vertebra(e), subsequent encounter for fracture with routine healing: Secondary | ICD-10-CM | POA: Diagnosis not present

## 2020-07-17 DIAGNOSIS — M80059D Age-related osteoporosis with current pathological fracture, unspecified femur, subsequent encounter for fracture with routine healing: Secondary | ICD-10-CM | POA: Diagnosis not present

## 2020-07-17 DIAGNOSIS — J189 Pneumonia, unspecified organism: Secondary | ICD-10-CM | POA: Diagnosis not present

## 2020-07-17 DIAGNOSIS — E1159 Type 2 diabetes mellitus with other circulatory complications: Secondary | ICD-10-CM | POA: Diagnosis not present

## 2020-07-19 ENCOUNTER — Other Ambulatory Visit: Payer: Self-pay

## 2020-07-19 MED ORDER — DILTIAZEM HCL ER COATED BEADS 240 MG PO CP24: 240.0000 mg | ORAL_CAPSULE | Freq: Every day | ORAL | 0 refills | Status: DC

## 2020-07-20 ENCOUNTER — Inpatient Hospital Stay (HOSPITAL_COMMUNITY)
Admission: EM | Admit: 2020-07-20 | Discharge: 2020-07-26 | DRG: 291 | Disposition: A | Discharge status: Home Health | Payer: Medicare Other | Attending: Internal Medicine | Admitting: Internal Medicine

## 2020-07-20 DIAGNOSIS — I4821 Permanent atrial fibrillation: Secondary | ICD-10-CM | POA: Diagnosis present

## 2020-07-20 DIAGNOSIS — J9601 Acute respiratory failure with hypoxia: Secondary | ICD-10-CM | POA: Diagnosis not present

## 2020-07-20 DIAGNOSIS — R609 Edema, unspecified: Secondary | ICD-10-CM | POA: Diagnosis present

## 2020-07-20 DIAGNOSIS — R000 Tachycardia, unspecified: Secondary | ICD-10-CM | POA: Diagnosis not present

## 2020-07-20 DIAGNOSIS — Z95828 Presence of other vascular implants and grafts: Secondary | ICD-10-CM

## 2020-07-20 DIAGNOSIS — J90 Pleural effusion, not elsewhere classified: Secondary | ICD-10-CM | POA: Diagnosis not present

## 2020-07-20 DIAGNOSIS — K219 Gastro-esophageal reflux disease without esophagitis: Secondary | ICD-10-CM | POA: Diagnosis present

## 2020-07-20 DIAGNOSIS — E1136 Type 2 diabetes mellitus with diabetic cataract: Secondary | ICD-10-CM | POA: Diagnosis present

## 2020-07-20 DIAGNOSIS — R0902 Hypoxemia: Secondary | ICD-10-CM | POA: Diagnosis not present

## 2020-07-20 DIAGNOSIS — R5381 Other malaise: Secondary | ICD-10-CM | POA: Diagnosis not present

## 2020-07-20 DIAGNOSIS — Z6836 Body mass index (BMI) 36.0-36.9, adult: Secondary | ICD-10-CM

## 2020-07-20 DIAGNOSIS — Z7401 Bed confinement status: Secondary | ICD-10-CM | POA: Diagnosis not present

## 2020-07-20 DIAGNOSIS — Z8542 Personal history of malignant neoplasm of other parts of uterus: Secondary | ICD-10-CM

## 2020-07-20 DIAGNOSIS — I959 Hypotension, unspecified: Secondary | ICD-10-CM | POA: Diagnosis present

## 2020-07-20 DIAGNOSIS — Z90710 Acquired absence of both cervix and uterus: Secondary | ICD-10-CM

## 2020-07-20 DIAGNOSIS — E1165 Type 2 diabetes mellitus with hyperglycemia: Secondary | ICD-10-CM | POA: Diagnosis not present

## 2020-07-20 DIAGNOSIS — I517 Cardiomegaly: Secondary | ICD-10-CM | POA: Diagnosis not present

## 2020-07-20 DIAGNOSIS — H409 Unspecified glaucoma: Secondary | ICD-10-CM | POA: Diagnosis present

## 2020-07-20 DIAGNOSIS — I5031 Acute diastolic (congestive) heart failure: Secondary | ICD-10-CM | POA: Diagnosis not present

## 2020-07-20 DIAGNOSIS — R234 Changes in skin texture: Secondary | ICD-10-CM | POA: Diagnosis present

## 2020-07-20 DIAGNOSIS — R627 Adult failure to thrive: Secondary | ICD-10-CM | POA: Diagnosis present

## 2020-07-20 DIAGNOSIS — D62 Acute posthemorrhagic anemia: Secondary | ICD-10-CM | POA: Diagnosis present

## 2020-07-20 DIAGNOSIS — I4891 Unspecified atrial fibrillation: Secondary | ICD-10-CM | POA: Diagnosis not present

## 2020-07-20 DIAGNOSIS — I110 Hypertensive heart disease with heart failure: Secondary | ICD-10-CM | POA: Diagnosis not present

## 2020-07-20 DIAGNOSIS — E669 Obesity, unspecified: Secondary | ICD-10-CM | POA: Diagnosis present

## 2020-07-20 DIAGNOSIS — Z20822 Contact with and (suspected) exposure to covid-19: Secondary | ICD-10-CM | POA: Diagnosis present

## 2020-07-20 DIAGNOSIS — E785 Hyperlipidemia, unspecified: Secondary | ICD-10-CM | POA: Diagnosis present

## 2020-07-20 DIAGNOSIS — D638 Anemia in other chronic diseases classified elsewhere: Secondary | ICD-10-CM | POA: Diagnosis present

## 2020-07-20 DIAGNOSIS — R062 Wheezing: Secondary | ICD-10-CM | POA: Diagnosis not present

## 2020-07-20 DIAGNOSIS — Z66 Do not resuscitate: Secondary | ICD-10-CM | POA: Diagnosis present

## 2020-07-20 DIAGNOSIS — I5033 Acute on chronic diastolic (congestive) heart failure: Secondary | ICD-10-CM | POA: Diagnosis present

## 2020-07-20 DIAGNOSIS — J811 Chronic pulmonary edema: Secondary | ICD-10-CM | POA: Diagnosis not present

## 2020-07-20 DIAGNOSIS — J810 Acute pulmonary edema: Secondary | ICD-10-CM | POA: Diagnosis not present

## 2020-07-20 DIAGNOSIS — Y95 Nosocomial condition: Secondary | ICD-10-CM | POA: Diagnosis present

## 2020-07-20 DIAGNOSIS — M2550 Pain in unspecified joint: Secondary | ICD-10-CM | POA: Diagnosis not present

## 2020-07-20 DIAGNOSIS — Z7901 Long term (current) use of anticoagulants: Secondary | ICD-10-CM

## 2020-07-20 DIAGNOSIS — R0602 Shortness of breath: Secondary | ICD-10-CM | POA: Diagnosis not present

## 2020-07-20 DIAGNOSIS — Z993 Dependence on wheelchair: Secondary | ICD-10-CM

## 2020-07-20 DIAGNOSIS — D500 Iron deficiency anemia secondary to blood loss (chronic): Secondary | ICD-10-CM | POA: Diagnosis present

## 2020-07-20 DIAGNOSIS — Z79890 Hormone replacement therapy (postmenopausal): Secondary | ICD-10-CM

## 2020-07-20 DIAGNOSIS — Z79899 Other long term (current) drug therapy: Secondary | ICD-10-CM

## 2020-07-20 DIAGNOSIS — E119 Type 2 diabetes mellitus without complications: Secondary | ICD-10-CM | POA: Diagnosis present

## 2020-07-20 DIAGNOSIS — Z7984 Long term (current) use of oral hypoglycemic drugs: Secondary | ICD-10-CM

## 2020-07-20 DIAGNOSIS — J189 Pneumonia, unspecified organism: Secondary | ICD-10-CM | POA: Diagnosis present

## 2020-07-20 DIAGNOSIS — Z91018 Allergy to other foods: Secondary | ICD-10-CM

## 2020-07-20 DIAGNOSIS — E039 Hypothyroidism, unspecified: Secondary | ICD-10-CM | POA: Diagnosis present

## 2020-07-20 DIAGNOSIS — Z452 Encounter for adjustment and management of vascular access device: Secondary | ICD-10-CM | POA: Diagnosis not present

## 2020-07-21 ENCOUNTER — Other Ambulatory Visit: Payer: Self-pay

## 2020-07-21 ENCOUNTER — Encounter (HOSPITAL_COMMUNITY): Payer: Self-pay | Admitting: Internal Medicine

## 2020-07-21 ENCOUNTER — Emergency Department (HOSPITAL_COMMUNITY): Payer: Medicare Other

## 2020-07-21 DIAGNOSIS — E039 Hypothyroidism, unspecified: Secondary | ICD-10-CM | POA: Diagnosis present

## 2020-07-21 DIAGNOSIS — K219 Gastro-esophageal reflux disease without esophagitis: Secondary | ICD-10-CM | POA: Diagnosis present

## 2020-07-21 DIAGNOSIS — Z8542 Personal history of malignant neoplasm of other parts of uterus: Secondary | ICD-10-CM | POA: Diagnosis not present

## 2020-07-21 DIAGNOSIS — I4891 Unspecified atrial fibrillation: Secondary | ICD-10-CM | POA: Diagnosis not present

## 2020-07-21 DIAGNOSIS — J811 Chronic pulmonary edema: Secondary | ICD-10-CM | POA: Diagnosis not present

## 2020-07-21 DIAGNOSIS — E785 Hyperlipidemia, unspecified: Secondary | ICD-10-CM | POA: Diagnosis present

## 2020-07-21 DIAGNOSIS — R627 Adult failure to thrive: Secondary | ICD-10-CM | POA: Diagnosis present

## 2020-07-21 DIAGNOSIS — I4821 Permanent atrial fibrillation: Secondary | ICD-10-CM | POA: Diagnosis present

## 2020-07-21 DIAGNOSIS — J90 Pleural effusion, not elsewhere classified: Secondary | ICD-10-CM | POA: Diagnosis present

## 2020-07-21 DIAGNOSIS — I5033 Acute on chronic diastolic (congestive) heart failure: Secondary | ICD-10-CM | POA: Diagnosis present

## 2020-07-21 DIAGNOSIS — E1165 Type 2 diabetes mellitus with hyperglycemia: Secondary | ICD-10-CM | POA: Diagnosis present

## 2020-07-21 DIAGNOSIS — Z452 Encounter for adjustment and management of vascular access device: Secondary | ICD-10-CM | POA: Diagnosis not present

## 2020-07-21 DIAGNOSIS — D500 Iron deficiency anemia secondary to blood loss (chronic): Secondary | ICD-10-CM | POA: Diagnosis not present

## 2020-07-21 DIAGNOSIS — Z90710 Acquired absence of both cervix and uterus: Secondary | ICD-10-CM | POA: Diagnosis not present

## 2020-07-21 DIAGNOSIS — Y95 Nosocomial condition: Secondary | ICD-10-CM | POA: Diagnosis present

## 2020-07-21 DIAGNOSIS — I517 Cardiomegaly: Secondary | ICD-10-CM | POA: Diagnosis not present

## 2020-07-21 DIAGNOSIS — Z20822 Contact with and (suspected) exposure to covid-19: Secondary | ICD-10-CM | POA: Diagnosis present

## 2020-07-21 DIAGNOSIS — E669 Obesity, unspecified: Secondary | ICD-10-CM | POA: Diagnosis present

## 2020-07-21 DIAGNOSIS — I5031 Acute diastolic (congestive) heart failure: Secondary | ICD-10-CM | POA: Diagnosis not present

## 2020-07-21 DIAGNOSIS — Z7401 Bed confinement status: Secondary | ICD-10-CM | POA: Diagnosis not present

## 2020-07-21 DIAGNOSIS — Z6836 Body mass index (BMI) 36.0-36.9, adult: Secondary | ICD-10-CM | POA: Diagnosis not present

## 2020-07-21 DIAGNOSIS — E119 Type 2 diabetes mellitus without complications: Secondary | ICD-10-CM | POA: Diagnosis present

## 2020-07-21 DIAGNOSIS — R5381 Other malaise: Secondary | ICD-10-CM | POA: Diagnosis present

## 2020-07-21 DIAGNOSIS — J9601 Acute respiratory failure with hypoxia: Secondary | ICD-10-CM | POA: Diagnosis present

## 2020-07-21 DIAGNOSIS — J810 Acute pulmonary edema: Secondary | ICD-10-CM | POA: Diagnosis not present

## 2020-07-21 DIAGNOSIS — J189 Pneumonia, unspecified organism: Secondary | ICD-10-CM | POA: Diagnosis present

## 2020-07-21 DIAGNOSIS — R609 Edema, unspecified: Secondary | ICD-10-CM | POA: Diagnosis present

## 2020-07-21 DIAGNOSIS — R234 Changes in skin texture: Secondary | ICD-10-CM | POA: Diagnosis present

## 2020-07-21 DIAGNOSIS — I110 Hypertensive heart disease with heart failure: Secondary | ICD-10-CM | POA: Diagnosis present

## 2020-07-21 DIAGNOSIS — I959 Hypotension, unspecified: Secondary | ICD-10-CM | POA: Diagnosis present

## 2020-07-21 DIAGNOSIS — R0602 Shortness of breath: Secondary | ICD-10-CM | POA: Diagnosis present

## 2020-07-21 DIAGNOSIS — H409 Unspecified glaucoma: Secondary | ICD-10-CM | POA: Diagnosis present

## 2020-07-21 DIAGNOSIS — Z66 Do not resuscitate: Secondary | ICD-10-CM | POA: Diagnosis present

## 2020-07-21 DIAGNOSIS — D638 Anemia in other chronic diseases classified elsewhere: Secondary | ICD-10-CM | POA: Diagnosis present

## 2020-07-21 DIAGNOSIS — Z993 Dependence on wheelchair: Secondary | ICD-10-CM | POA: Diagnosis not present

## 2020-07-21 DIAGNOSIS — M2550 Pain in unspecified joint: Secondary | ICD-10-CM | POA: Diagnosis not present

## 2020-07-21 DIAGNOSIS — D62 Acute posthemorrhagic anemia: Secondary | ICD-10-CM | POA: Diagnosis present

## 2020-07-21 LAB — URINALYSIS, ROUTINE W REFLEX MICROSCOPIC
Bacteria, UA: NONE SEEN
Bilirubin Urine: NEGATIVE
Glucose, UA: NEGATIVE mg/dL
Ketones, ur: NEGATIVE mg/dL
Nitrite: NEGATIVE
Protein, ur: 100 mg/dL — AB
Specific Gravity, Urine: >1.046 — ABNORMAL HIGH (ref 1.005–1.030)
pH: 8 (ref 5.0–8.0)

## 2020-07-21 LAB — CBC
HCT: 28.1 % — ABNORMAL LOW (ref 36.0–46.0)
Hemoglobin: 7.9 g/dL — ABNORMAL LOW (ref 12.0–15.0)
MCH: 25.6 pg — ABNORMAL LOW (ref 26.0–34.0)
MCHC: 28.1 g/dL — ABNORMAL LOW (ref 30.0–36.0)
MCV: 91.2 fL (ref 80.0–100.0)
Platelets: 416 10*3/uL — ABNORMAL HIGH (ref 150–400)
RBC: 3.08 MIL/uL — ABNORMAL LOW (ref 3.87–5.11)
RDW: 18.7 % — ABNORMAL HIGH (ref 11.5–15.5)
WBC: 11.1 10*3/uL — ABNORMAL HIGH (ref 4.0–10.5)
nRBC: 0 % (ref 0.0–0.2)

## 2020-07-21 LAB — IRON AND TIBC
Iron: 38 ug/dL (ref 28–170)
Saturation Ratios: 11 % (ref 10.4–31.8)
TIBC: 332 ug/dL (ref 250–450)
UIBC: 294 ug/dL

## 2020-07-21 LAB — COMPREHENSIVE METABOLIC PANEL
ALT: 8 U/L (ref 0–44)
AST: 19 U/L (ref 15–41)
Albumin: 2.8 g/dL — ABNORMAL LOW (ref 3.5–5.0)
Alkaline Phosphatase: 73 U/L (ref 38–126)
Anion gap: 12 (ref 5–15)
BUN: 15 mg/dL (ref 8–23)
CO2: 29 mmol/L (ref 22–32)
Calcium: 8.9 mg/dL (ref 8.9–10.3)
Chloride: 95 mmol/L — ABNORMAL LOW (ref 98–111)
Creatinine, Ser: 0.96 mg/dL (ref 0.44–1.00)
GFR, Estimated: >60 mL/min (ref >60)
Glucose, Bld: 250 mg/dL — ABNORMAL HIGH (ref 70–99)
Potassium: 3.5 mmol/L (ref 3.5–5.1)
Sodium: 136 mmol/L (ref 135–145)
Total Bilirubin: 1.2 mg/dL (ref 0.3–1.2)
Total Protein: 6.6 g/dL (ref 6.5–8.1)

## 2020-07-21 LAB — LACTIC ACID, PLASMA
Lactic Acid, Venous: 1 mmol/L (ref 0.5–1.9)
Lactic Acid, Venous: 1 mmol/L (ref 0.5–1.9)

## 2020-07-21 LAB — CBC WITH DIFFERENTIAL/PLATELET
Abs Immature Granulocytes: 0.13 10*3/uL — ABNORMAL HIGH (ref 0.00–0.07)
Basophils Absolute: 0.1 10*3/uL (ref 0.0–0.1)
Basophils Relative: 0 %
Eosinophils Absolute: 2.2 10*3/uL — ABNORMAL HIGH (ref 0.0–0.5)
Eosinophils Relative: 12 %
HCT: 24.5 % — ABNORMAL LOW (ref 36.0–46.0)
Hemoglobin: 7.1 g/dL — ABNORMAL LOW (ref 12.0–15.0)
Immature Granulocytes: 1 %
Lymphocytes Relative: 10 %
Lymphs Abs: 1.8 10*3/uL (ref 0.7–4.0)
MCH: 26.9 pg (ref 26.0–34.0)
MCHC: 29 g/dL — ABNORMAL LOW (ref 30.0–36.0)
MCV: 92.8 fL (ref 80.0–100.0)
Monocytes Absolute: 0.7 10*3/uL (ref 0.1–1.0)
Monocytes Relative: 4 %
Neutro Abs: 12.9 10*3/uL — ABNORMAL HIGH (ref 1.7–7.7)
Neutrophils Relative %: 73 %
Platelets: 503 10*3/uL — ABNORMAL HIGH (ref 150–400)
RBC: 2.64 MIL/uL — ABNORMAL LOW (ref 3.87–5.11)
RDW: 18.8 % — ABNORMAL HIGH (ref 11.5–15.5)
WBC: 17.7 10*3/uL — ABNORMAL HIGH (ref 4.0–10.5)
nRBC: 0 % (ref 0.0–0.2)

## 2020-07-21 LAB — FOLATE: Folate: 16.4 ng/mL

## 2020-07-21 LAB — BASIC METABOLIC PANEL
Anion gap: 11 (ref 5–15)
BUN: 11 mg/dL (ref 8–23)
CO2: 31 mmol/L (ref 22–32)
Calcium: 8.6 mg/dL — ABNORMAL LOW (ref 8.9–10.3)
Chloride: 97 mmol/L — ABNORMAL LOW (ref 98–111)
Creatinine, Ser: 0.89 mg/dL (ref 0.44–1.00)
GFR, Estimated: >60 mL/min (ref >60)
Glucose, Bld: 166 mg/dL — ABNORMAL HIGH (ref 70–99)
Potassium: 3.5 mmol/L (ref 3.5–5.1)
Sodium: 139 mmol/L (ref 135–145)

## 2020-07-21 LAB — HEMOGLOBIN AND HEMATOCRIT, BLOOD
HCT: 27.5 % — ABNORMAL LOW (ref 36.0–46.0)
Hemoglobin: 7.6 g/dL — ABNORMAL LOW (ref 12.0–15.0)

## 2020-07-21 LAB — MAGNESIUM: Magnesium: 1.8 mg/dL (ref 1.7–2.4)

## 2020-07-21 LAB — BRAIN NATRIURETIC PEPTIDE: B Natriuretic Peptide: 115 pg/mL — ABNORMAL HIGH (ref 0.0–100.0)

## 2020-07-21 LAB — GLUCOSE, CAPILLARY
Glucose-Capillary: 149 mg/dL — ABNORMAL HIGH (ref 70–99)
Glucose-Capillary: 193 mg/dL — ABNORMAL HIGH (ref 70–99)
Glucose-Capillary: 164 mg/dL — ABNORMAL HIGH (ref 70–99)

## 2020-07-21 LAB — PHOSPHORUS: Phosphorus: 3.2 mg/dL (ref 2.5–4.6)

## 2020-07-21 LAB — RESP PANEL BY RT-PCR (FLU A&B, COVID) ARPGX2
Influenza A by PCR: NEGATIVE
Influenza B by PCR: NEGATIVE
SARS Coronavirus 2 by RT PCR: NEGATIVE

## 2020-07-21 LAB — TSH: TSH: 2.412 u[IU]/mL (ref 0.350–4.500)

## 2020-07-21 LAB — LACTATE DEHYDROGENASE: LDH: 140 U/L (ref 98–192)

## 2020-07-21 LAB — PROCALCITONIN: Procalcitonin: 0.1 ng/mL

## 2020-07-21 LAB — TROPONIN I (HIGH SENSITIVITY)
Troponin I (High Sensitivity): 6 ng/L (ref <18)
Troponin I (High Sensitivity): 6 ng/L (ref <18)

## 2020-07-21 LAB — VITAMIN B12: Vitamin B-12: 4077 pg/mL — ABNORMAL HIGH (ref 180–914)

## 2020-07-21 LAB — CBG MONITORING, ED: Glucose-Capillary: 111 mg/dL — ABNORMAL HIGH (ref 70–99)

## 2020-07-21 LAB — FERRITIN: Ferritin: 17 ng/mL (ref 11–307)

## 2020-07-21 IMAGING — CR DG CHEST 2V
2 series · 2 of 2 positions shown · non-contrast
Comparison: None.

CLINICAL DATA: Shortness of breath

EXAM:
CHEST - 2 VIEW

[chest lat]
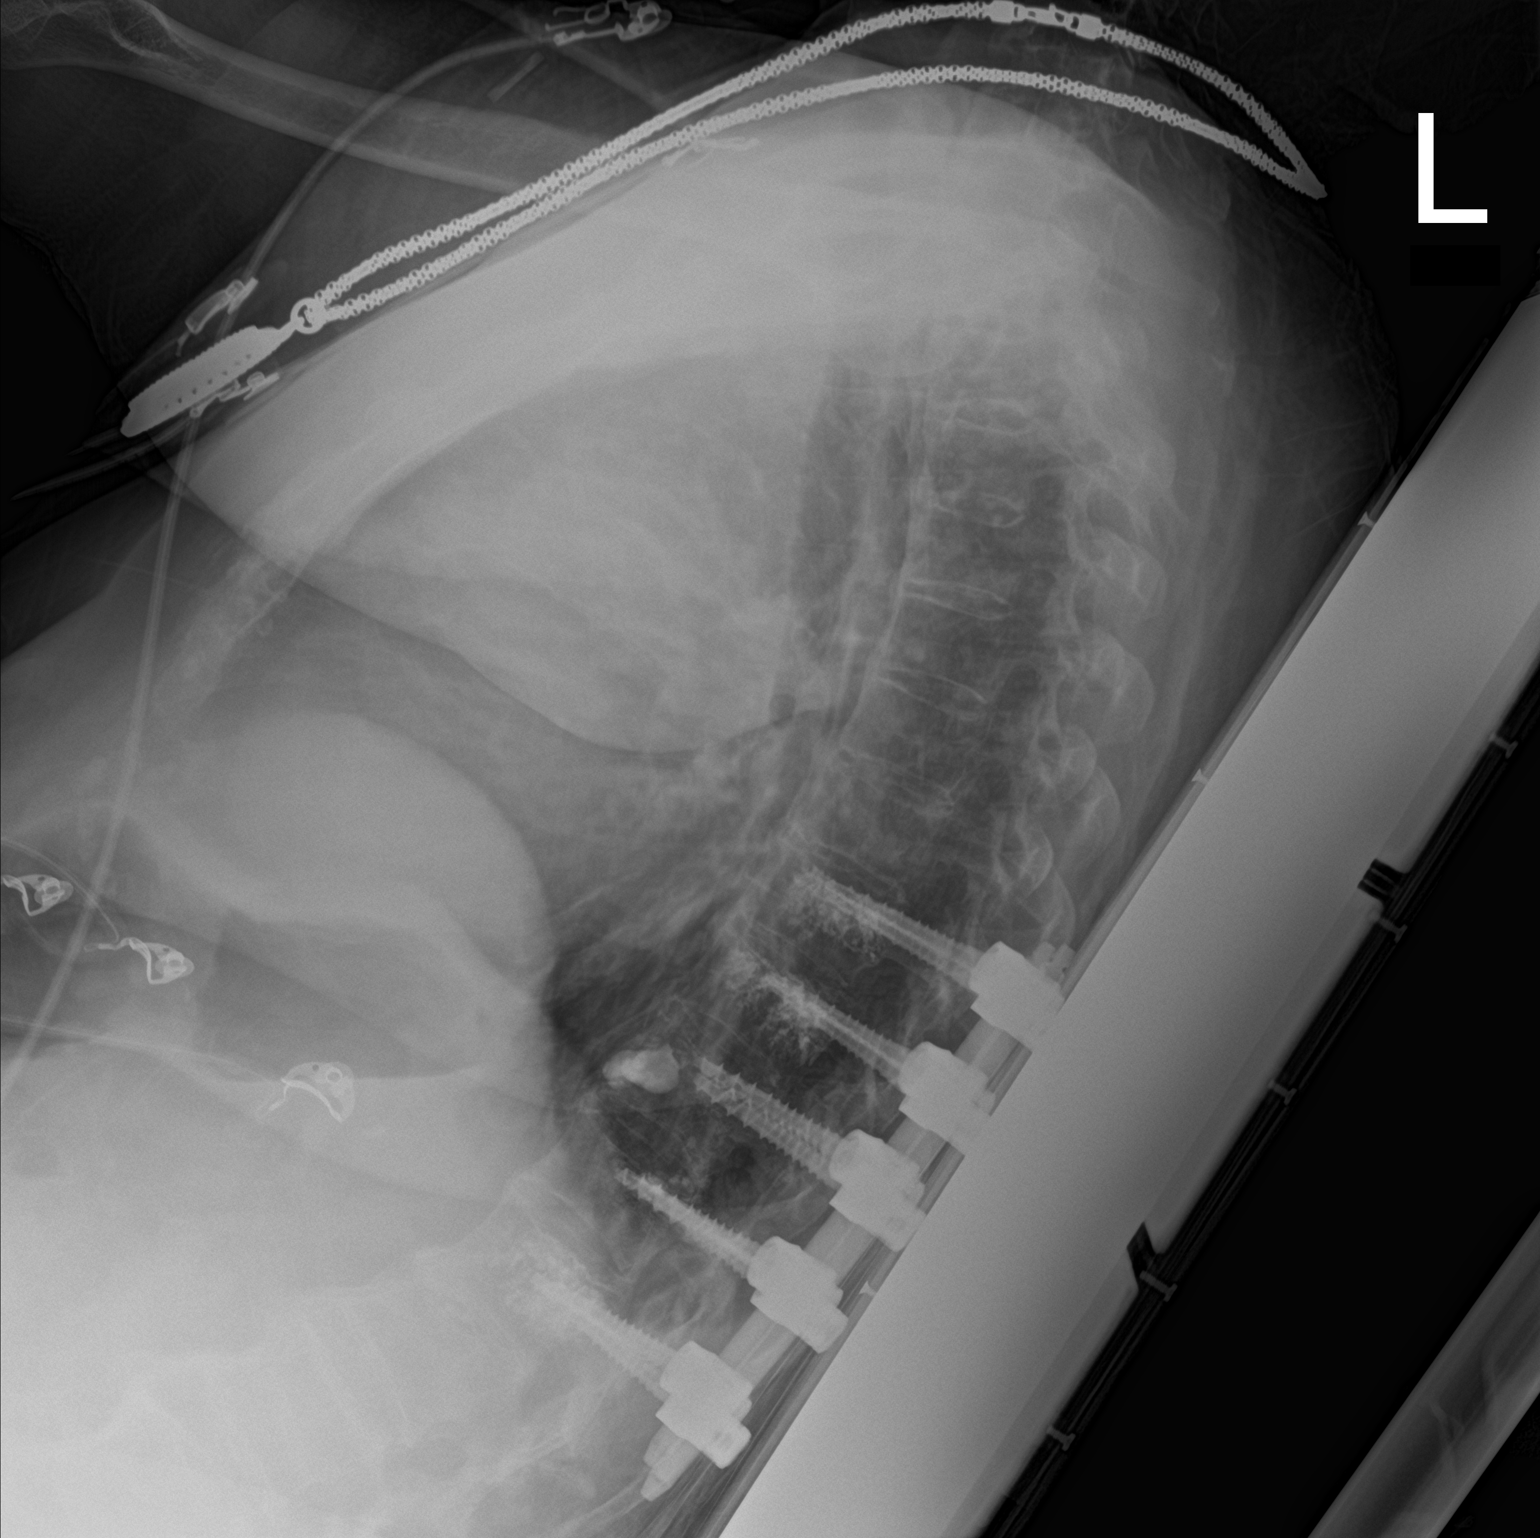

[chest ap]
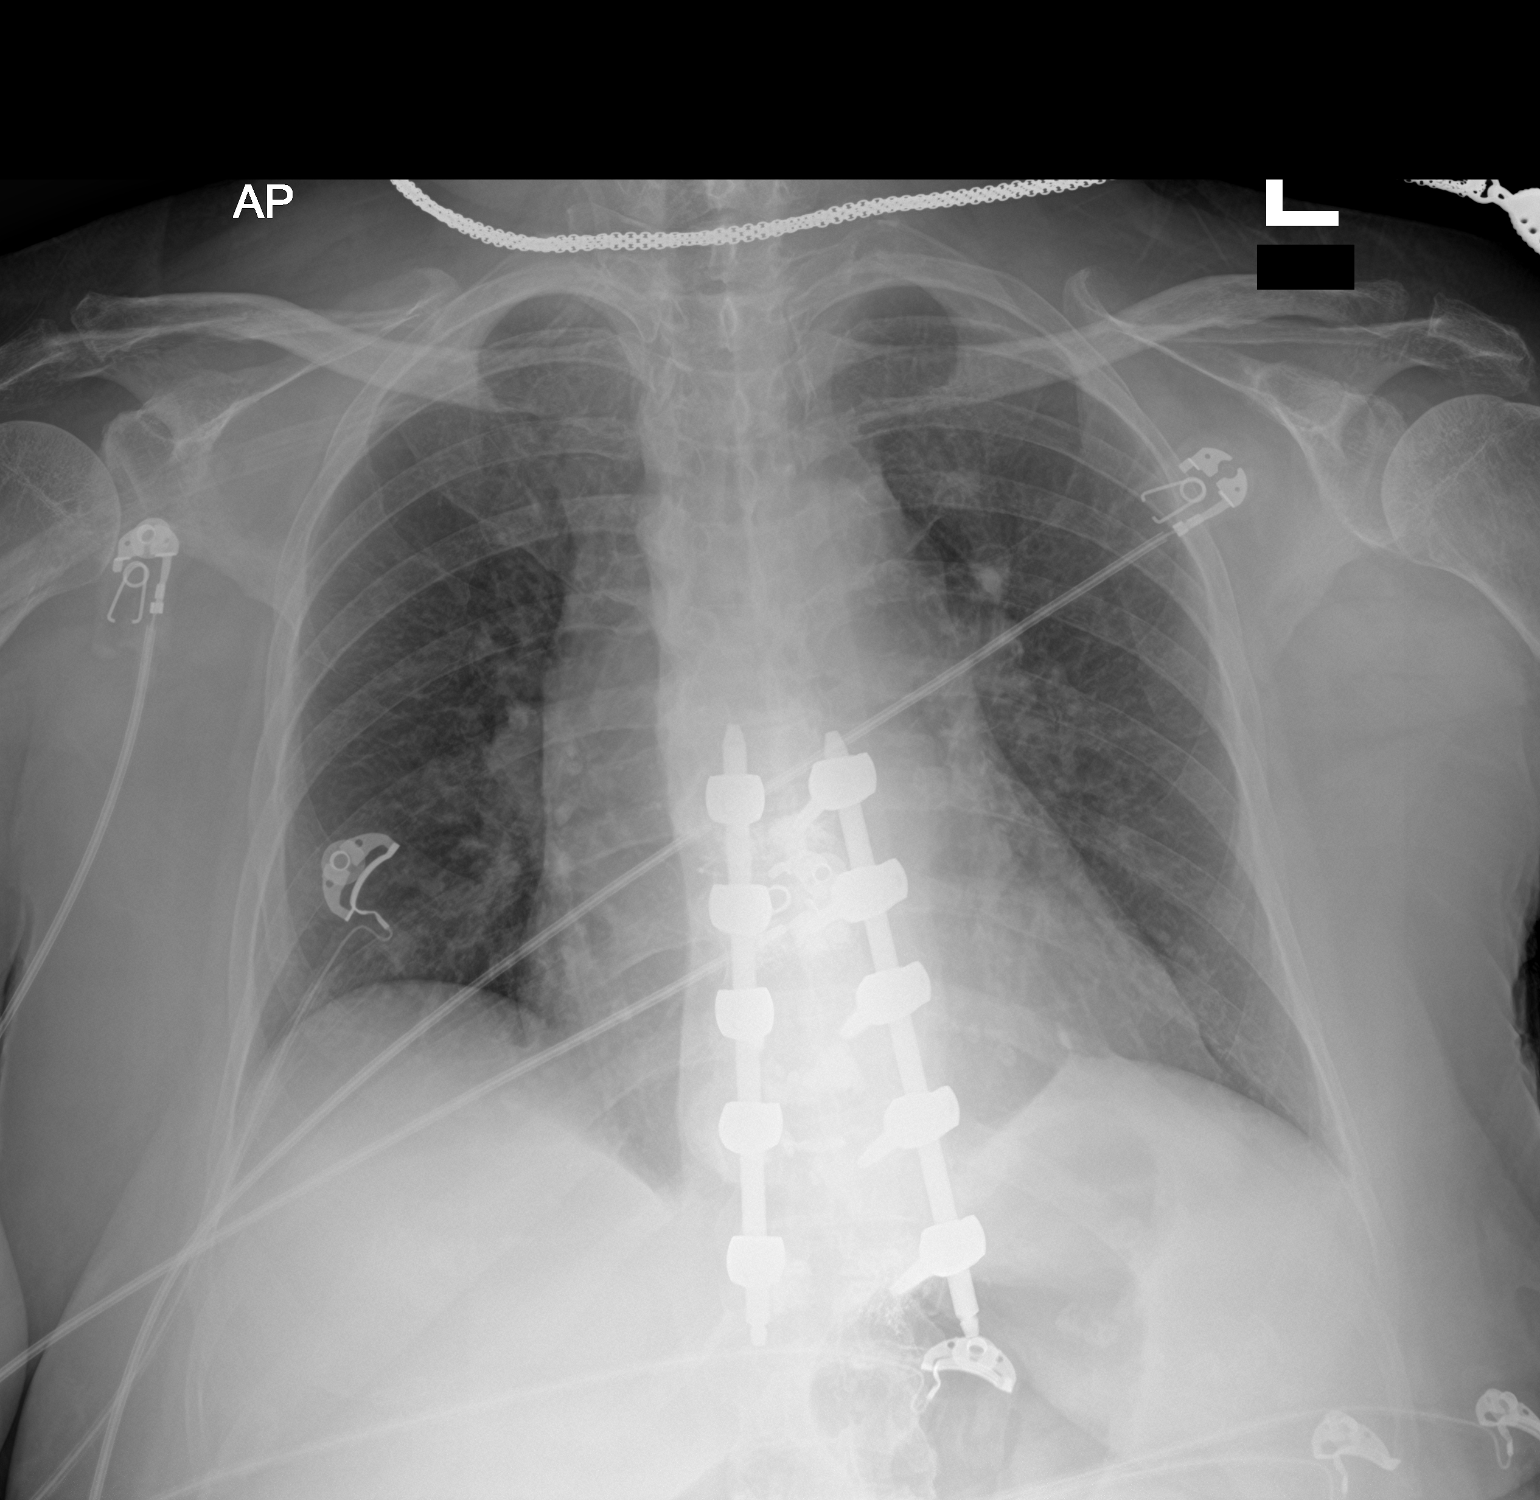

[2 of 2 positions shown; findings below may reference images not displayed]

FINDINGS: The heart size and mediastinal contours are within normal limits.
Both lungs are clear. Posterior spinal fixation hardware is seen.
IMPRESSION: No active cardiopulmonary disease.

## 2020-07-21 IMAGING — CT CT ANGIO CHEST
2 of 8 series · 17 of 46 positions shown · IV contrast (APPLIED)
Comparison: Chest CT dated [DATE]

CLINICAL DATA: Shortness of breath, lower extremity edema.

EXAM:
CT ANGIOGRAPHY CHEST WITH CONTRAST
TECHNIQUE: Multidetector CT imaging of the chest was performed using the
standard protocol during bolus administration of intravenous
contrast. Multiplanar CT image reconstructions and MIPs were
obtained to evaluate the vascular anatomy.
CONTRAST:  100mL OMNIPAQUE IOHEXOL 350 MG/ML SOLN

[Series 7: thins · axial · 0.77mm/px · z∈[+1229,+1462]mm · 14 of 385 slices shown]
[im 26/385  lung]
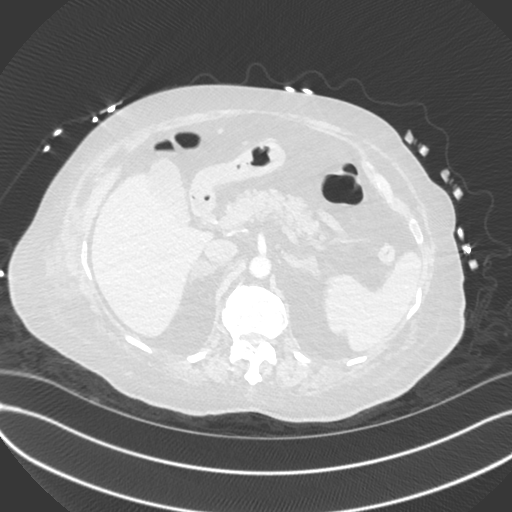
[im 52/385  soft-tissue]
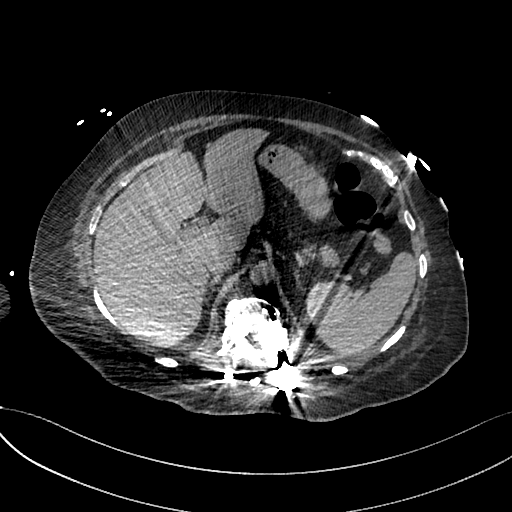
[im 77/385  lung]
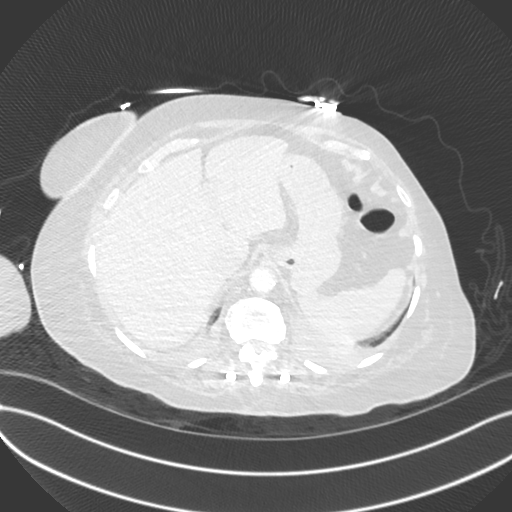
[im 103/385  soft-tissue]
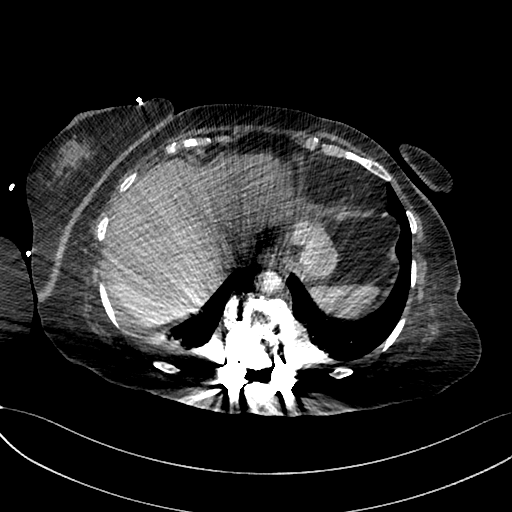
[im 129/385  lung]
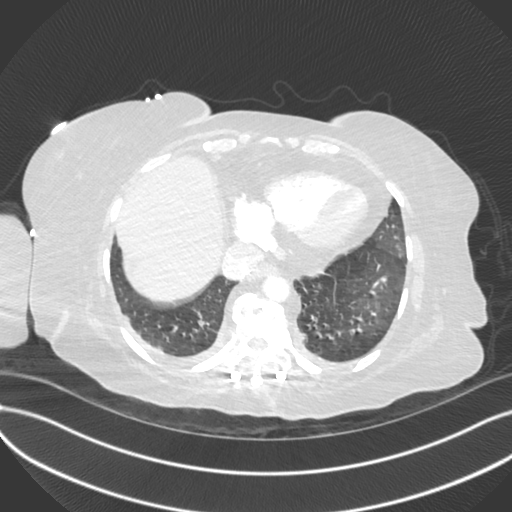
[im 154/385  soft-tissue]
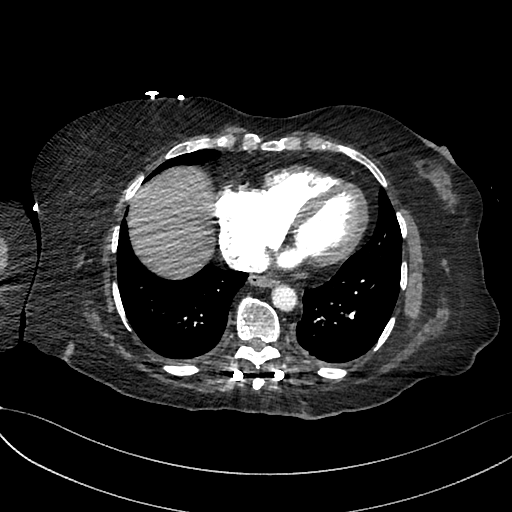
[im 180/385  lung]
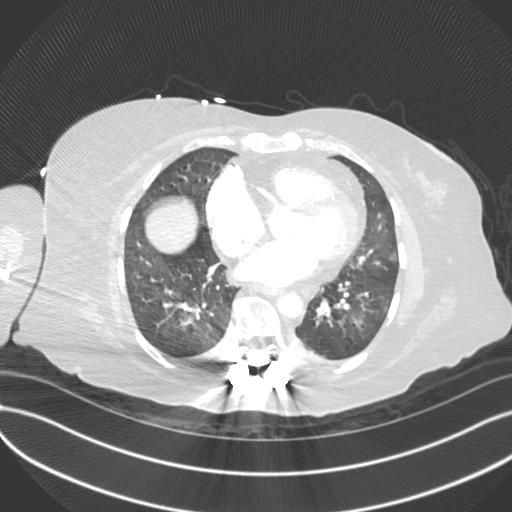
[im 205/385  soft-tissue]
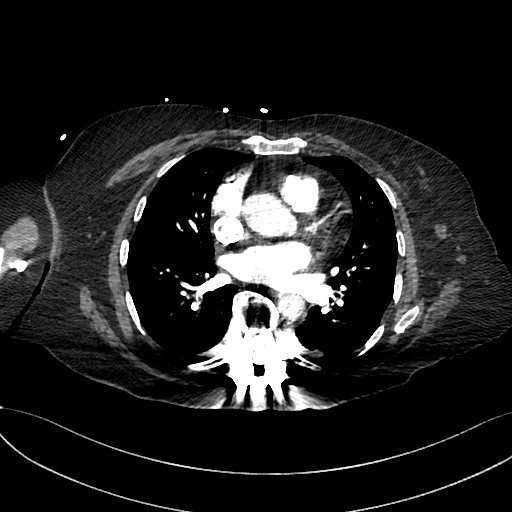
[im 231/385  lung]
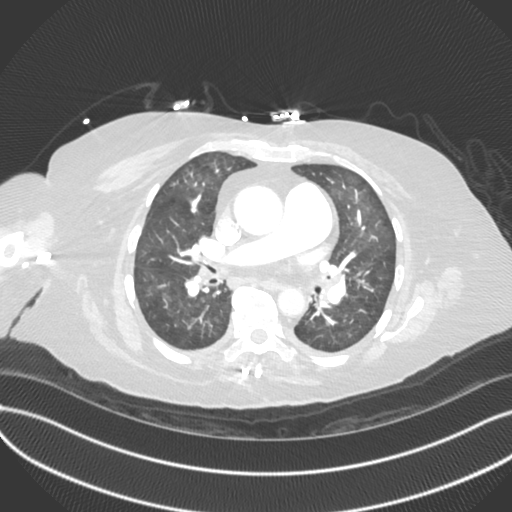
[im 257/385  soft-tissue]
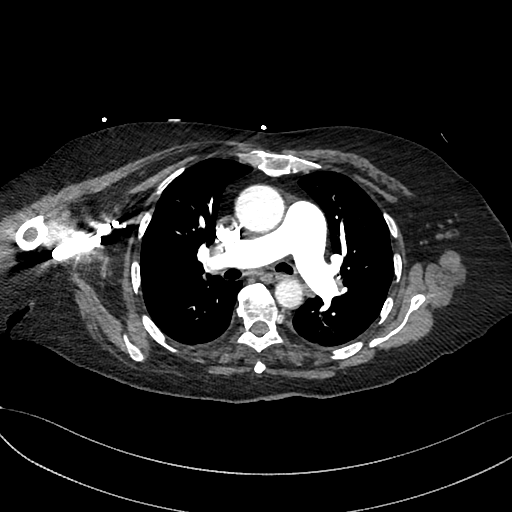
[im 282/385  lung]
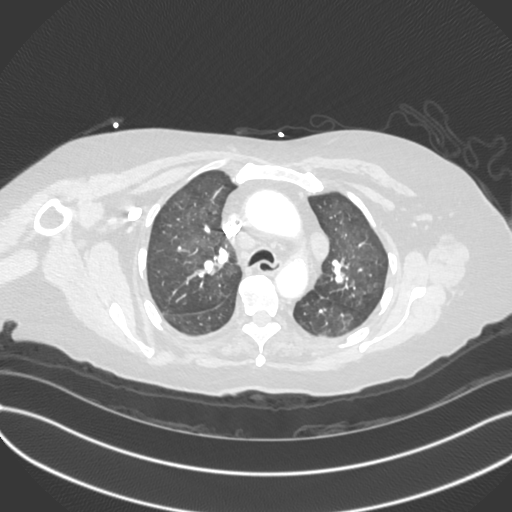
[im 308/385  soft-tissue]
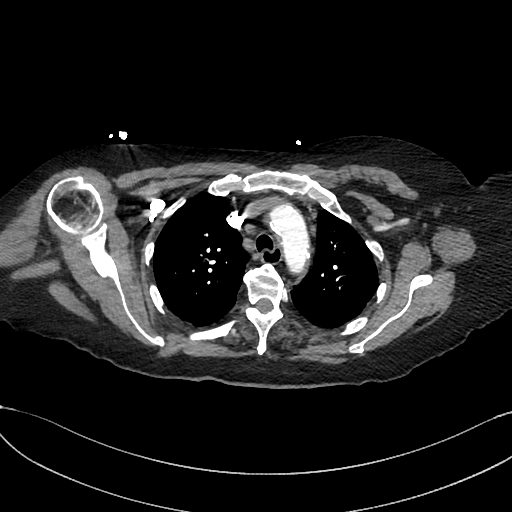
[im 333/385  lung]
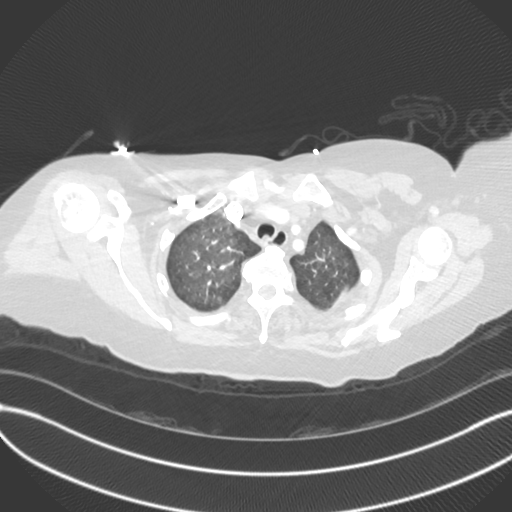
[im 359/385  soft-tissue]
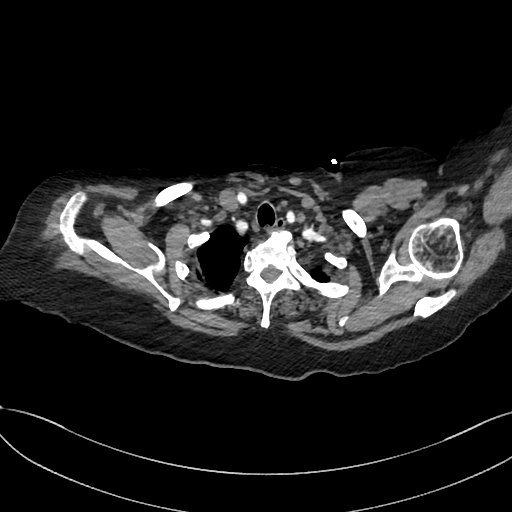

[Series 8: cor · coronal · 0.52mm/px · 3 of 144 slices shown]
[im 36/144  soft-tissue]
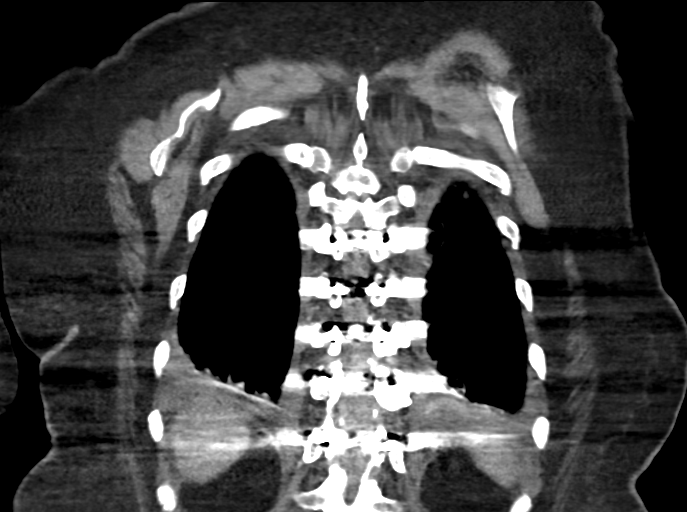
[im 72/144  soft-tissue]
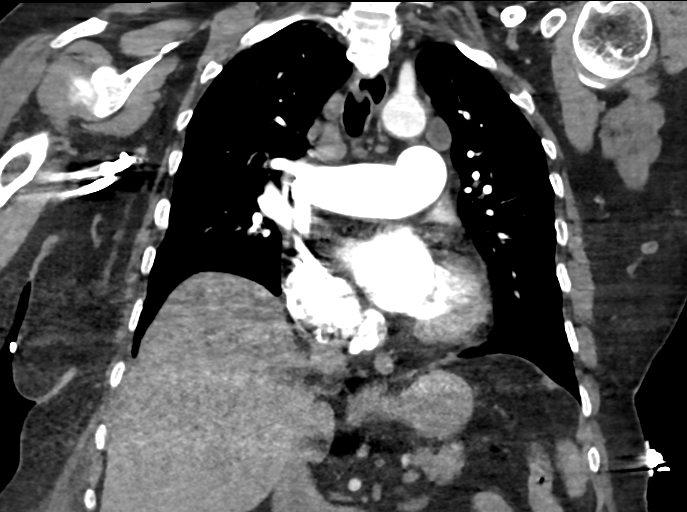
[im 108/144  soft-tissue]
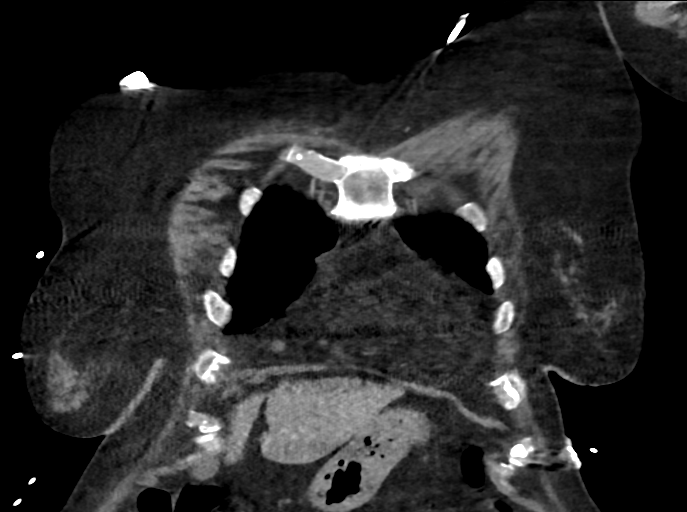

[17 of 46 positions shown; findings below may reference images not displayed]

FINDINGS: Cardiovascular: Some of the most peripheral segmental and
subsegmental pulmonary artery branches are difficult to definitively
characterize due to patient breathing motion artifact, however,
there is no pulmonary embolism identified within the main, lobar or
central segmental pulmonary artery branches bilaterally.

There is mild aneurysmal ectasia of the ascending thoracic aorta,
measuring 3.6 cm diameter. Mild aortic atherosclerosis. No aortic
dissection. No pericardial effusion. Again noted is prominence of
the main pulmonary artery trunk, measuring 3.6 cm diameter.

Mediastinum/Nodes: Stable mildly prominent lymph node within the
aortopulmonary window region, measuring 1.3 cm diameter, stable
compared to the earlier chest CT. Also, stable mildly prominent
lymph nodes within the anterior mediastinum and RIGHT lower
paratracheal space, largest again measuring 1 cm short axis
dimension. No new lymph nodes appreciated.

Esophagus is unremarkable.  Trachea is unremarkable.

Lungs/Pleura: Mosaic pattern throughout both lungs, indicating some
degree of air trapping. Additional hazy ground-glass opacities
scattered throughout both lungs. No pleural effusion or w
pneumothorax.

Upper Abdomen: Stable mass within the RIGHT adrenal gland, measuring
1.3 cm, with CT density measurement compatible with benign adenoma.
Limited images of the upper abdomen are otherwise unremarkable.

Musculoskeletal: No acute or suspicious osseous finding. Fixation
hardware appears intact and appropriately positioned within the
lower thoracic spine.

Review of the MIP images confirms the above findings.
IMPRESSION: 1. Mosaic pattern throughout both lungs, indicating some degree of
air trapping, and additional hazy ground-glass opacities scattered
throughout both lungs. Differential for the ground-glass opacities
includes atypical pneumonias such as viral or fungal, interstitial
pneumonias, edema related to volume overload/CHF, chronic
interstitial diseases, hypersensitivity pneumonitis, and respiratory
bronchiolitis. Early [[place]] pneumonia can have this appearance.
Favor pulmonary edema related to volume overload/CHF.
2. No pulmonary embolism seen, with mild study limitations detailed
above.
3. Stable prominence of the main pulmonary artery trunk, measuring
3.6 cm diameter, suggesting chronic pulmonary artery hypertension.
4. Stable mildly prominent lymph nodes within the mediastinum, the
largest again measuring 1.3 cm short axis dimension. Consider
additional follow-up chest CT in 12 months to ensure 2 year
stability.
5. Benign RIGHT adrenal adenoma.

Aortic Atherosclerosis ([[place]]-[[place]]).

## 2020-07-21 MED ORDER — ALBUMIN HUMAN 25 % IV SOLN
25.0000 g | Freq: Once | INTRAVENOUS | Status: AC
  Administered 2020-07-21: 25 g via INTRAVENOUS
  Filled 2020-07-21: qty 100

## 2020-07-21 MED ORDER — INSULIN ASPART 100 UNIT/ML ~~LOC~~ SOLN: 0.0000 [IU] | Freq: Every day | SUBCUTANEOUS | Status: DC

## 2020-07-21 MED ORDER — SODIUM CHLORIDE 0.9 % IV SOLN
1.0000 g | Freq: Once | INTRAVENOUS | Status: AC
  Administered 2020-07-21: 1 g via INTRAVENOUS
  Filled 2020-07-21: qty 10

## 2020-07-21 MED ORDER — VITAMIN B-12 100 MCG PO TABS
500.0000 ug | ORAL_TABLET | Freq: Every day | ORAL | Status: DC
  Administered 2020-07-23 – 2020-07-26 (×4): 500 ug via ORAL
  Filled 2020-07-21 (×4): qty 5

## 2020-07-21 MED ORDER — TRAVOPROST (BAK FREE) 0.004 % OP SOLN
1.0000 [drp] | Freq: Every day | OPHTHALMIC | Status: DC
  Administered 2020-07-21 – 2020-07-25 (×5): 1 [drp] via OPHTHALMIC
  Filled 2020-07-21 (×2): qty 2.5

## 2020-07-21 MED ORDER — IOHEXOL 350 MG/ML SOLN
100.0000 mL | Freq: Once | INTRAVENOUS | Status: AC | PRN
  Administered 2020-07-21: 100 mL via INTRAVENOUS

## 2020-07-21 MED ORDER — AMIODARONE HCL IN DEXTROSE 360-4.14 MG/200ML-% IV SOLN
60.0000 mg/h | INTRAVENOUS | Status: AC
  Administered 2020-07-21: 60 mg/h via INTRAVENOUS

## 2020-07-21 MED ORDER — ATORVASTATIN CALCIUM 10 MG PO TABS
10.0000 mg | ORAL_TABLET | Freq: Every day | ORAL | Status: DC
  Administered 2020-07-21 – 2020-07-26 (×6): 10 mg via ORAL
  Filled 2020-07-21 (×7): qty 1

## 2020-07-21 MED ORDER — LEVOTHYROXINE SODIUM 75 MCG PO TABS
75.0000 ug | ORAL_TABLET | Freq: Every day | ORAL | Status: DC
  Administered 2020-07-21 – 2020-07-26 (×6): 75 ug via ORAL
  Filled 2020-07-21 (×6): qty 1

## 2020-07-21 MED ORDER — ALBUTEROL SULFATE (2.5 MG/3ML) 0.083% IN NEBU: 5.0000 mg | INHALATION_SOLUTION | Freq: Once | RESPIRATORY_TRACT | Status: DC

## 2020-07-21 MED ORDER — INSULIN ASPART 100 UNIT/ML ~~LOC~~ SOLN
0.0000 [IU] | Freq: Three times a day (TID) | SUBCUTANEOUS | Status: DC
  Administered 2020-07-21: 1 [IU] via SUBCUTANEOUS
  Administered 2020-07-21 – 2020-07-22 (×2): 2 [IU] via SUBCUTANEOUS
  Administered 2020-07-22 (×2): 1 [IU] via SUBCUTANEOUS
  Administered 2020-07-23: 2 [IU] via SUBCUTANEOUS
  Administered 2020-07-23: 1 [IU] via SUBCUTANEOUS
  Administered 2020-07-23: 3 [IU] via SUBCUTANEOUS
  Administered 2020-07-24: 5 [IU] via SUBCUTANEOUS
  Administered 2020-07-24: 2 [IU] via SUBCUTANEOUS
  Administered 2020-07-24: 1 [IU] via SUBCUTANEOUS
  Administered 2020-07-25 (×2): 3 [IU] via SUBCUTANEOUS
  Administered 2020-07-25 – 2020-07-26 (×2): 2 [IU] via SUBCUTANEOUS
  Administered 2020-07-26: 3 [IU] via SUBCUTANEOUS
  Administered 2020-07-26: 1 [IU] via SUBCUTANEOUS

## 2020-07-21 MED ORDER — RIVAROXABAN 20 MG PO TABS
20.0000 mg | ORAL_TABLET | Freq: Every day | ORAL | Status: DC
  Administered 2020-07-21 – 2020-07-26 (×6): 20 mg via ORAL
  Filled 2020-07-21 (×6): qty 1

## 2020-07-21 MED ORDER — FUROSEMIDE 10 MG/ML IJ SOLN
20.0000 mg | Freq: Two times a day (BID) | INTRAMUSCULAR | Status: AC
  Administered 2020-07-21 – 2020-07-22 (×3): 20 mg via INTRAVENOUS
  Filled 2020-07-21 (×3): qty 2

## 2020-07-21 MED ORDER — POTASSIUM CHLORIDE CRYS ER 20 MEQ PO TBCR
20.0000 meq | EXTENDED_RELEASE_TABLET | Freq: Every day | ORAL | Status: DC
  Administered 2020-07-21 – 2020-07-22 (×2): 20 meq via ORAL
  Filled 2020-07-21 (×2): qty 1

## 2020-07-21 MED ORDER — SODIUM CHLORIDE 0.9 % IV SOLN
1.0000 g | INTRAVENOUS | Status: DC
  Administered 2020-07-22: 1 g via INTRAVENOUS
  Filled 2020-07-21: qty 10

## 2020-07-21 MED ORDER — PANTOPRAZOLE SODIUM 40 MG PO TBEC
40.0000 mg | DELAYED_RELEASE_TABLET | Freq: Two times a day (BID) | ORAL | Status: DC
  Administered 2020-07-21 – 2020-07-26 (×12): 40 mg via ORAL
  Filled 2020-07-21 (×12): qty 1

## 2020-07-21 MED ORDER — AMIODARONE HCL IN DEXTROSE 360-4.14 MG/200ML-% IV SOLN
30.0000 mg/h | INTRAVENOUS | Status: DC
  Administered 2020-07-21 – 2020-07-23 (×3): 30 mg/h via INTRAVENOUS
  Filled 2020-07-21 (×5): qty 200

## 2020-07-21 NOTE — Progress Notes (Signed)
PROGRESS NOTE    Crystal Ashley  OIZ:[id number] DOB: [birth year] DOA: 07/20/2020 PCP: [name], PA     Brief Narrative:  74 y.o. Asian female PMHx Permanent A. fib on Xarelto, Chronic Diastolic CHF , Hypothyroidism, GERD, DM type II type controlled with hyperglycemia , obesity, HLD, bed-bound with pressure ulcer.  Presented to [facility] ED with complaints of gradually worsening shortness of breath of 2 days duration.  Associated with orthopnea and hypoxia.  She has been compliant with her home diuretic.  EMS was activated, found to be hypoxic with O2 saturation in the 80's on room air, improved on 2L [place].  She denies chest pain, fevers, cough, or GI symptoms.  Volume overload on exam, imaging consistent with pulmonary edema, bilateral pleural effusions, and concern for acute on chronic diastolic CHF.  Due to leukocytosis, EDP started Rocephin for possible CAP. TRH, hospitalist team, asked to admit.  ED Course: Lab studies remarkable for WBC 17.7K, hemoglobin 7.1K, neutrophil count 12.9, platelet count 503K.  Repeat H&H is pending.      Subjective: A/O x4, negative CP, negative abdominal pain.   Assessment & Plan: Covid vaccination; vaccinated   Active Problems:   Atrial fibrillation with RVR (HCC)   Acute respiratory failure with hypoxia (HCC)   Bilateral pleural effusion   Pulmonary edema   Acute on chronic diastolic CHF (congestive heart failure) (HCC)   Chronic blood loss anemia   Controlled type 2 diabetes mellitus with hyperglycemia (HCC)   Hypothyroidism, adult   Physical deconditioning   Acute respiratory failure with hypoxia -Multifactorial acute on chronic CHF, A. fib RVR, pleural effusion chronic vs acute blood loss.  Treat underlying causes -Presented with O2 saturation in 80s on room air improved on 2 L O2 -Per patient and son not on home O2 -CTA chest negative PE -Titrate O2 to maintain SPO2> 92%   Pulmonary edema/Bilateral pleural effusion -Most likely  cardiogenic in nature -See CHF  Acute on Chronic Diastolic CHF -Presented with dyspnea, hypoxia, orthopnea, B/L LE pitting edema -Chest x-ray consistent with pulmonary edema and bilateral pleural effusions -Last 2D echo done on 02/28/2020 showed LVEF 55 to 70% with no regional wall motion abnormalities. -Echocardiogram pending -Albumin 25 g x 1 dose -Lasix IV 20 mg BID.  Once A. fib RVR under control will titrate up -Amiodarone drip -Strict in and out -Daily weight -Transfuse for hemoglobin<7  A. fib RVR -on Xarelto -Normally on diltiazem p.o. for rate control however (hold) lowers BP more than amiodarone. -Currently not rate controlled -See CHF  Hypotension -Multifactorial A. fib RVR, anemia -Treat underlying problems   Chronic Blood Loss Anemia -Patient has been slowly trending down in hemoglobin since July 2021. -02/26/2020 Hemoglobin= 10.2 -Per patient and son no obvious signs of blood in stool or urine. -Trend hemoglobin -Anemia panel pending -Fecal occult blood pending Lab Results  Component Value Date   HGB 7.9 (L) 07/21/2020   HGB 7.6 (L) 07/21/2020   HGB 7.1 (L) 07/21/2020   HGB 8.0 (L) 05/27/2020   HGB 8.9 (L) 05/26/2020   Leukocytosis -Reactive?,  -Obtain UA, urine culture, blood cultures, and procalcitonin level -Presented with WBC 17K. -Started on Rocephin in the ED, continue for now until active infective process is ruled out -Use procalcitonin to narrow antibiotics  DM type II controlled with hyperglycemia -10/10 hemoglobin A1c = 6.1  -Sensitive SSI   Hypothyroidism -Obtain TSH -Synthroid 75 mcg daily   GERD Stable Resume PPI  Physical deconditioning -PT OT to assess -Fall  precautions  Bed-bound with pressure ulcers, POA -Wound care specialist consult -Local wound care    DVT prophylaxis: SCD acute blood loss anemia Code Status: DNR Family Communication: 12/5 son at bedside for discussion of plan of care answered all  questions Status is: Inpatient    Dispo: The patient is from: Home              Anticipated d/c is to: Home              Anticipated d/c date is: 12/9              Patient currently unstable      Consultants:    Procedures/Significant Events:    I have personally reviewed and interpreted all radiology studies and my findings are as above.  VENTILATOR SETTINGS: Nasal cannula 12/5 Flow; 2 L/min SPO2 100%   Cultures   Antimicrobials: Anti-infectives (From admission, onward)   Start     Ordered Stop   07/22/20 0800  cefTRIAXone (ROCEPHIN) 1 g in sodium chloride 0.9 % 100 mL IVPB        07/21/20 0613     07/21/20 0315  cefTRIAXone (ROCEPHIN) 1 g in sodium chloride 0.9 % 100 mL IVPB        07/21/20 0309 07/21/20 0606       Devices    LINES / TUBES:      Continuous Infusions: . amiodarone 60 mg/hr (07/21/20 1607)  . amiodarone    . [START ON 07/22/2020] cefTRIAXone (ROCEPHIN)  IV       Objective: Vitals:   07/21/20 0715 07/21/20 0745 07/21/20 0756 07/21/20 0800  BP: 106/73 (!) 108/51  117/61  Pulse: 86 86  88  Resp: 17 (!) 26  20  Temp:   97.8 F (36.6 C)   TempSrc:      SpO2: 100% 100%  100%  Weight:      Height:        Intake/Output Summary (Last 24 hours) at 07/21/2020 1743 Last data filed at 07/21/2020 7096 Gross per 24 hour  Intake 98.2 ml  Output --  Net 98.2 ml   Filed Weights   07/21/20 0007  Weight: 84.6 kg    Examination:  General: A/O x4, positive acute respiratory distress Eyes: negative scleral hemorrhage, negative anisocoria, negative icterus ENT: Negative Runny nose, negative gingival bleeding, Neck:  Negative scars, masses, torticollis, lymphadenopathy, JVD Lungs: Clear to auscultation bilaterally without wheezes or crackles Cardiovascular: Regular rate and rhythm without murmur gallop or rub normal S1 and S2 Abdomen: OBESE, negative abdominal pain, nondistended, positive soft, bowel sounds, no rebound, no ascites, no  appreciable mass Extremities: No significant cyanosis, clubbing.  2-3+ pedal edema to mid thigh Skin: Negative rashes, lesions, ulcers Psychiatric:  Negative depression, negative anxiety, negative fatigue, negative mania  Central nervous system:  Cranial nerves II through XII intact, tongue/uvula midline, all extremities muscle strength 5/5, sensation intact throughout, negative dysarthria, negative expressive aphasia, negative receptive aphasia.  .     Data Reviewed: Care during the described time interval was provided by me .  I have reviewed this patient's available data, including medical history, events of note, physical examination, and all test results as part of my evaluation.  CBC: Recent Labs  Lab 07/21/20 0018 07/21/20 0455 07/21/20 1600  WBC 17.7*  --  11.1*  NEUTROABS 12.9*  --   --   HGB 7.1* 7.6* 7.9*  HCT 24.5* 27.5* 28.1*  MCV 92.8  --  91.2  PLT  503*  --  101*   Basic Metabolic Panel: Recent Labs  Lab 07/21/20 0018 07/21/20 1600  NA 136 139  K 3.5 3.5  CL 95* 97*  CO2 29 31  GLUCOSE 250* 166*  BUN 15 11  CREATININE 0.96 0.89  CALCIUM 8.9 8.6*  MG  --  1.8  PHOS  --  3.2   GFR: Estimated Creatinine Clearance: 53.5 mL/min (by C-G formula based on SCr of 0.89 mg/dL). Liver Function Tests: Recent Labs  Lab 07/21/20 0018  AST 19  ALT 8  ALKPHOS 73  BILITOT 1.2  PROT 6.6  ALBUMIN 2.8*   No results for input(s): LIPASE, AMYLASE in the last 168 hours. No results for input(s): AMMONIA in the last 168 hours. Coagulation Profile: No results for input(s): INR, PROTIME in the last 168 hours. Cardiac Enzymes: No results for input(s): CKTOTAL, CKMB, CKMBINDEX, TROPONINI in the last 168 hours. BNP (last 3 results) No results for input(s): PROBNP in the last 8760 hours. HbA1C: No results for input(s): HGBA1C in the last 72 hours. CBG: Recent Labs  Lab 07/21/20 0726 07/21/20 1143 07/21/20 1626  GLUCAP 111* 149* 193*   Lipid Profile: No results  for input(s): CHOL, HDL, LDLCALC, TRIG, CHOLHDL, LDLDIRECT in the last 72 hours. Thyroid Function Tests: Recent Labs    07/21/20 0812  TSH 2.412   Anemia Panel: Recent Labs    07/21/20 0811 07/21/20 0812  VITAMINB12 4,077*  --   FOLATE  --  16.4  FERRITIN 17  --   TIBC 332  --   IRON 38  --    Sepsis Labs: Recent Labs  Lab 07/21/20 0310 07/21/20 0542 07/21/20 0811  PROCALCITON  --   --  0.10  LATICACIDVEN 1.0 1.0  --     Recent Results (from the past 240 hour(s))  Resp Panel by RT-PCR (Flu A&B, Covid) Nasopharyngeal Swab     Status: None   Collection Time: 07/21/20  1:36 AM   Specimen: Nasopharyngeal Swab; Nasopharyngeal(NP) swabs in vial transport medium  Result Value Ref Range Status   SARS Coronavirus 2 by RT PCR NEGATIVE NEGATIVE Final    Comment: (NOTE) SARS-CoV-2 target nucleic acids are NOT DETECTED.  The SARS-CoV-2 RNA is generally detectable in upper respiratory specimens during the acute phase of infection. The lowest concentration of SARS-CoV-2 viral copies this assay can detect is 138 copies/mL. A negative result does not preclude SARS-Cov-2 infection and should not be used as the sole basis for treatment or other patient management decisions. A negative result may occur with  improper specimen collection/handling, submission of specimen other than nasopharyngeal swab, presence of viral mutation(s) within the areas targeted by this assay, and inadequate number of viral copies(<138 copies/mL). A negative result must be combined with clinical observations, patient history, and epidemiological information. The expected result is Negative.  Fact Sheet for Patients:  EntrepreneurPulse.com.au  Fact Sheet for Healthcare Providers:  IncredibleEmployment.be  This test is no t yet approved or cleared by the [place] FDA and  has been authorized for detection and/or diagnosis of SARS-CoV-2 by FDA under an Emergency Use  Authorization (EUA). This EUA will remain  in effect (meaning this test can be used) for the duration of the COVID-19 declaration under Section 564(b)(1) of the Act, 21 U.S.C.section 360bbb-3(b)(1), unless the authorization is terminated  or revoked sooner.       Influenza A by PCR NEGATIVE NEGATIVE Final   Influenza B by PCR NEGATIVE NEGATIVE Final  Comment: (NOTE) The Xpert Xpress SARS-CoV-2/FLU/RSV plus assay is intended as an aid in the diagnosis of influenza from Nasopharyngeal swab specimens and should not be used as a sole basis for treatment. Nasal washings and aspirates are unacceptable for Xpert Xpress SARS-CoV-2/FLU/RSV testing.  Fact Sheet for Patients: EntrepreneurPulse.com.au  Fact Sheet for Healthcare Providers: IncredibleEmployment.be  This test is not yet approved or cleared by the [place] FDA and has been authorized for detection and/or diagnosis of SARS-CoV-2 by FDA under an Emergency Use Authorization (EUA). This EUA will remain in effect (meaning this test can be used) for the duration of the COVID-19 declaration under Section 564(b)(1) of the Act, 21 U.S.C. section 360bbb-3(b)(1), unless the authorization is terminated or revoked.  Performed at [facility], [place] [address], [place], [place] [address]   Blood culture (routine x 2)     Status: None (Preliminary result)   Collection Time: 07/21/20  9:36 AM   Specimen: BLOOD RIGHT HAND  Result Value Ref Range Status   Specimen Description BLOOD RIGHT HAND  Final   Special Requests   Final    BOTTLES DRAWN AEROBIC ONLY Blood Culture adequate volume Performed at [facility], [place] [address] [place], [place] [address]    Culture PENDING  Incomplete   Report Status PENDING  Incomplete         Radiology Studies: DG Chest 2 View  Result Date: 07/21/2020 CLINICAL DATA:  Shortness of breath EXAM: CHEST - 2 VIEW COMPARISON:  None. FINDINGS: The  heart size and mediastinal contours are within normal limits. Both lungs are clear. Posterior spinal fixation hardware is seen. IMPRESSION: No active cardiopulmonary disease. Electronically Signed   By: Prudencio Pair M.D.   On: 07/21/2020 00:49   CT Angio Chest PE W and/or Wo Contrast  Result Date: 07/21/2020 CLINICAL DATA:  Shortness of breath, lower extremity edema. EXAM: CT ANGIOGRAPHY CHEST WITH CONTRAST TECHNIQUE: Multidetector CT imaging of the chest was performed using the standard protocol during bolus administration of intravenous contrast. Multiplanar CT image reconstructions and MIPs were obtained to evaluate the vascular anatomy. CONTRAST:  169mL OMNIPAQUE IOHEXOL 350 MG/ML SOLN COMPARISON:  Chest CT dated 05/26/2020 FINDINGS: Cardiovascular: Some of the most peripheral segmental and subsegmental pulmonary artery branches are difficult to definitively characterize due to patient breathing motion artifact, however, there is no pulmonary embolism identified within the main, lobar or central segmental pulmonary artery branches bilaterally. 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 Mild aortic atherosclerosis. No aortic dissection. No pericardial effusion. Again noted is prominence of the main pulmonary artery trunk, measuring 3.6 cm diameter. Mediastinum/Nodes: Stable mildly prominent lymph node within the aortopulmonary window region, measuring 1.3 cm diameter, stable compared to the earlier chest CT. Also, stable mildly prominent lymph nodes within the anterior mediastinum and RIGHT lower paratracheal space, largest again measuring 1 cm short axis dimension. No new lymph nodes appreciated. Esophagus is unremarkable.  Trachea is unremarkable. Lungs/Pleura: Mosaic pattern throughout both lungs, indicating some degree of air trapping. Additional hazy ground-glass opacities scattered throughout both lungs. No pleural effusion or w pneumothorax. Upper  Abdomen: Stable mass within the RIGHT adrenal gland, measuring 1.3 cm, with CT density measurement compatible with benign adenoma. Limited images of the upper abdomen are otherwise unremarkable. Musculoskeletal: No acute or suspicious osseous finding. Fixation hardware appears intact and appropriately positioned within the lower thoracic spine. Review of the MIP images confirms the above findings. IMPRESSION: 1. Mosaic pattern  throughout both lungs, indicating some degree of air trapping, and additional hazy ground-glass opacities scattered throughout both lungs. Differential for the ground-glass opacities includes atypical pneumonias such as viral or fungal, interstitial pneumonias, edema related to volume overload/CHF, chronic interstitial diseases, hypersensitivity pneumonitis, and respiratory bronchiolitis. Early COVID-19 pneumonia can have this appearance. Crystal pulmonary edema related to volume overload/CHF. 2. No pulmonary embolism seen, with mild study limitations detailed above. 3. Stable prominence of the main pulmonary artery trunk, measuring 3.6 cm diameter, suggesting chronic pulmonary artery hypertension. 4. Stable mildly prominent lymph nodes within the mediastinum, the largest again measuring 1.3 cm short axis dimension. Consider additional follow-up chest CT in 12 months to ensure 2 year stability. 5. Benign RIGHT adrenal adenoma. Aortic Atherosclerosis (ICD10-I70.0). Electronically Signed   By: Franki Cabot M.D.   On: 07/21/2020 04:19        Scheduled Meds: . albuterol  5 mg Nebulization Once  . atorvastatin  10 mg Oral Daily  . furosemide  20 mg Intravenous BID  . insulin aspart  0-5 Units Subcutaneous QHS  . insulin aspart  0-9 Units Subcutaneous TID WC  . levothyroxine  75 mcg Oral QAC breakfast  . pantoprazole  40 mg Oral BID  . potassium chloride  20 mEq Oral Daily  . rivaroxaban  20 mg Oral Q supper  . Travoprost (BAK Free)  1 drop Both Eyes QHS  . vitamin B-12  500 mcg  Oral Daily   Continuous Infusions: . amiodarone 60 mg/hr (07/21/20 1607)  . amiodarone    . [START ON 07/22/2020] cefTRIAXone (ROCEPHIN)  IV       LOS: 0 days    Time spent:40 min    Theresa Wedel, Geraldo Docker, MD [facility] Pager [phone]  If 7PM-7AM, please contact night-coverage [email or web address] Password [facility] 07/21/2020, 5:43 PM

## 2020-07-21 NOTE — ED Notes (Signed)
Attempted report x1. 

## 2020-07-21 NOTE — ED Notes (Signed)
Transported to CT 

## 2020-07-21 NOTE — Progress Notes (Signed)
PT Cancellation Note  Patient Details Name: Crystal Ashley MRN: [id number] DOB: 1946-07-02   Cancelled Treatment:    Reason Eval/Treat Not Completed: Medical issues which prohibited therapy. PT attempted to see pt however while taking history from son pt appears to choke on some water when taking medications. Pt able to speak to RN however sats lingering in mid 70s while on supplemental oxygen. PT attempts to return later and sats in mid 80s. PT will attempt to return at a later time when respiratory status is more stable.   Zenaida Niece 07/21/2020, 5:31 PM

## 2020-07-21 NOTE — ED Provider Notes (Addendum)
[facility] EMERGENCY DEPARTMENT Provider Note  CSN: [id number] Arrival date & time: 07/20/20 2345  Chief Complaint(s) Shortness of Breath  HPI Crystal Ashley is a 74 y.o. female with a past medical history listed below including atrial fibrillation on Xarelto, diastolic dysfunction who presents to the emergency department with 2 days of gradually worsening shortness of breath that was exacerbated tonight upon laying down.  Patient was found to be hypoxic with sats in the eighties on room air by EMS.  He denies any recent fevers or infections.  No coughing or congestion.  Her peripheral edema is similar to what she normally is.  They report that she is compliant with her diuretic.  Shortness of breath has improved after being placed on supplemental oxygen.  Of note patient's son did report that he was diagnosed with pneumonia yesterday.  HPI  Past Medical History Past Medical History:  Diagnosis Date  . Atrial fibrillation ([place])   . Cancer ([place])   . Cataract   . Diabetes mellitus without complication ([place])   . Fracture 2001   left ankle  . Glaucoma   . Hypertension   . Thyroid disease    Patient Active Problem List   Diagnosis Date Noted  . Pressure injury of skin 05/27/2020  . SOB (shortness of breath) 05/26/2020  . PNA (pneumonia) 05/22/2020  . Elective surgery   . Fusion of spine, thoracolumbar region 02/26/2020  . Acute blood loss anemia   . PAF (paroxysmal atrial fibrillation) ([place])   . Controlled type 2 diabetes mellitus with hyperglycemia, without long-term current use of insulin ([place])   . Fracture of vertebra due to osteoporosis ([place])   . Acute midline thoracic back pain   . Multiple traumatic injuries 02/12/2020  . Closed fracture of multiple pubic rami, right, initial encounter ([place]) 02/07/2020  . Atrial fibrillation, chronic ([place]) 02/07/2020  . Chronic combined systolic (congestive) and diastolic (congestive) heart failure ([place])   . Acute  respiratory failure with hypoxia ([place]) 10/18/2018  . Declining mobility 09/23/2018  . B12 deficiency 09/23/2018  . Morbid obesity with BMI of 40.0-44.9, adult ([place]) 09/23/2018  . Anemia 09/23/2018  . History of cardioversion 04/26/2018  . Type 2 diabetes mellitus without complication, without long-term current use of insulin ([place]) 03/15/2018  . Atrial fibrillation with RVR ([place]) 03/15/2018  . Morbid obesity ([place]) 01/04/2017  . Hyperlipidemia associated with type 2 diabetes mellitus ([place]) 01/04/2017  . Vitamin D deficiency 12/15/2016  . Glaucoma   . Hypertension associated with diabetes ([place]) 04/19/2014  . Hypothyroidism 04/19/2014  . History of endometrial cancer 03/30/2014   Home Medication(s) Prior to Admission medications   Medication Sig Start Date End Date Taking? Authorizing Provider  acetaminophen (TYLENOL) 500 MG tablet Take 1,000 mg by mouth every 8 (eight) hours.    [provider]  atorvastatin (LIPITOR) 10 MG tablet Take 1 tablet (10 mg total) by mouth daily. Patient taking differently: Take 10 mg by mouth every evening.  12/01/19   [name], PA  diltiazem (CARDIZEM CD) 240 MG 24 hr capsule Take 1 capsule (240 mg total) by mouth daily. 07/19/20 10/17/20  Adrian Prows, MD  dorzolamide-timolol (COSOPT) 22.3-6.8 MG/ML ophthalmic solution Place 1 drop into both eyes 2 (two) times daily.  10/05/17   [provider]  furosemide (LASIX) 40 MG tablet Take 1 tablet (40 mg total) by mouth daily. 11/29/19   [name], PA  Lancet Devices (LANCET DEVICE WITH EJECTOR) MISC Use to check blood sugar daily  and prn 06/10/20   [name], PA  Lancets 33G MISC Use to check blood sugar daily and prn 06/10/20   [name], PA  levothyroxine (SYNTHROID) 75 MCG tablet Take 1 tablet (75 mcg total) by mouth daily before breakfast. 12/01/19   [name], PA  Lidocaine (ASPERCREME LIDOCAINE) 4 % PTCH Place 1 patch onto the skin See admin instructions. Apply 1  patch to right mid back and right hip every morning - remove at night    [provider]  Menthol, Topical Analgesic, (BIOFREEZE) 4 % GEL Apply 1 application topically daily. Apply to right side rib cage and lateral thigh    [provider]  metFORMIN (GLUCOPHAGE) 1000 MG tablet Take 1 tablet (1,000 mg total) by mouth daily with breakfast. 12/01/19   [name], PA  methocarbamol (ROBAXIN) 500 MG tablet Take 1 tablet (500 mg total) by mouth every 6 (six) hours as needed for muscle spasms. 03/01/20   [name], MD  pantoprazole (PROTONIX) 40 MG tablet Take 1 tablet (40 mg total) by mouth daily. Patient taking differently: Take 40 mg by mouth 2 (two) times daily.  03/01/20   [name], MD  polyethylene glycol (MIRALAX / GLYCOLAX) 17 g packet Take 17 g by mouth daily as needed for mild constipation. Patient taking differently: Take 17 g by mouth daily as needed (constipation).  02/12/20   Kayleen Memos, DO  potassium chloride (MICRO-K) 10 MEQ CR capsule Take 10 mEq by mouth daily.    [provider]  rivaroxaban (XARELTO) 20 MG TABS tablet Take 1 tablet (20 mg total) by mouth daily with supper. 07/05/20   Adrian Prows, MD  sennosides (SENOKOT) 8.8 MG/5ML syrup Take 10 mLs by mouth 2 (two) times daily.    [provider]  travoprost, benzalkonium, (TRAVATAN) 0.004 % ophthalmic solution Place 1 drop into both eyes at bedtime.     [provider]  vitamin B-12 (CYANOCOBALAMIN) 250 MCG tablet Take 500 mcg by mouth daily.    [provider]                                                                                                                                    Past Surgical History Past Surgical History:  Procedure Laterality Date  . APPLICATION OF ROBOTIC ASSISTANCE FOR SPINAL PROCEDURE N/A 02/26/2020   Procedure: APPLICATION OF ROBOTIC ASSISTANCE FOR SPINAL PROCEDURE;  Surgeon: [name], MD;  Location: [facility];   Service: Neurosurgery;  Laterality: N/A;  . CARDIOVERSION N/A 04/08/2018   Procedure: CARDIOVERSION;  Surgeon: Adrian Prows, MD;  Location: [facility] ENDOSCOPY;  Service: Cardiovascular;  Laterality: N/A;  . CARDIOVERSION N/A 05/22/2019   Procedure: CARDIOVERSION;  Surgeon: Adrian Prows, MD;  Location: [facility];  Service: Cardiovascular;  Laterality: N/A;  . FOOT SURGERY Left   . HYSTEROSCOPY     POLYPECTOMY  . LUMBAR PERCUTANEOUS PEDICLE SCREW 4 LEVEL  N/A 02/26/2020   Procedure: Thoracic eight to thoracic twelve posterior percutaneous instrumentation with cement augmentation and bilateral medial facetectomy for decompression at Thoracic ten-eleven;  Surgeon: [name], MD;  Location: [facility];  Service: Neurosurgery;  Laterality: N/A;   Family History Family History  Problem Relation Age of Onset  . Diabetes Son   . Healthy Mother   . Healthy Father     Social History Social History   Tobacco Use  . Smoking status: Never Smoker  . Smokeless tobacco: Never Used  Vaping Use  . Vaping Use: Never used  Substance Use Topics  . Alcohol use: No  . Drug use: Never   Allergies Meat [alpha-gal]  Review of Systems Review of Systems All other systems are reviewed and are negative for acute change except as noted in the HPI  Physical Exam Vital Signs  I have reviewed the triage vital signs BP 115/69   Pulse (!) 52   Temp 98 F (36.7 C) (Oral)   Resp 17   Ht 5' (1.524 m)   Wt 84.6 kg   SpO2 88%   BMI 36.43 kg/m   Physical Exam Vitals reviewed.  Constitutional:      General: She is not in acute distress.    Appearance: She is well-developed. She is not diaphoretic.  HENT:     Head: Normocephalic and atraumatic.     Nose: Nose normal.  Eyes:     General: No scleral icterus.       Right eye: No discharge.        Left eye: No discharge.     Conjunctiva/sclera: Conjunctivae normal.     Pupils: Pupils are equal, round, and reactive to light.  Cardiovascular:     Rate and  Rhythm: Regular rhythm. Tachycardia present.     Heart sounds: No murmur heard.  No friction rub. No gallop.   Pulmonary:     Effort: Pulmonary effort is normal. Tachypnea present. No respiratory distress.     Breath sounds: No stridor. Examination of the right-middle field reveals rales. Examination of the left-middle field reveals rales. Examination of the right-lower field reveals rales. Examination of the left-lower field reveals rales. Rales (fine) present.  Abdominal:     General: There is no distension.     Palpations: Abdomen is soft.     Tenderness: There is no abdominal tenderness.  Musculoskeletal:        General: No tenderness.     Cervical back: Normal range of motion and neck supple.     Right lower leg: 1+ Pitting Edema present.     Left lower leg: 1+ Pitting Edema present.  Skin:    General: Skin is warm and dry.     Findings: No erythema or rash.  Neurological:     Mental Status: She is alert and oriented to person, place, and time.     ED Results and Treatments Labs (all labs ordered are listed, but only abnormal results are displayed) Labs Reviewed  CBC WITH DIFFERENTIAL/PLATELET - Abnormal; Notable for the following components:      Result Value   WBC 17.7 (*)    RBC 2.64 (*)    Hemoglobin 7.1 (*)    HCT 24.5 (*)    MCHC 29.0 (*)    RDW 18.8 (*)    Platelets 503 (*)    Neutro Abs 12.9 (*)    Eosinophils Absolute 2.2 (*)    Abs Immature Granulocytes 0.13 (*)    All  other components within normal limits  COMPREHENSIVE METABOLIC PANEL - Abnormal; Notable for the following components:   Chloride 95 (*)    Glucose, Bld 250 (*)    Albumin 2.8 (*)    All other components within normal limits  BRAIN NATRIURETIC PEPTIDE - Abnormal; Notable for the following components:   B Natriuretic Peptide 115.0 (*)    All other components within normal limits  RESP PANEL BY RT-PCR (FLU A&B, COVID) ARPGX2  CULTURE, BLOOD (ROUTINE X 2)  CULTURE, BLOOD (ROUTINE X 2)   LACTIC ACID, PLASMA  PATHOLOGIST SMEAR REVIEW  LACTIC ACID, PLASMA  TROPONIN I (HIGH SENSITIVITY)  TROPONIN I (HIGH SENSITIVITY)                                                                                                                         EKG  EKG Interpretation  Date/Time:  Sunday July 21 2020 01:26:30 EST Ventricular Rate:  104 PR Interval:    QRS Duration: 77 QT Interval:  342 QTC Calculation: 450 R Axis:   36 Text Interpretation: Atrial fibrillation Ventricular premature complex Low voltage, extremity leads Confirmed by Addison Lank [phone]) on 07/21/2020 3:27:26 AM      Radiology DG Chest 2 View  Result Date: 07/21/2020 CLINICAL DATA:  Shortness of breath EXAM: CHEST - 2 VIEW COMPARISON:  None. FINDINGS: The heart size and mediastinal contours are within normal limits. Both lungs are clear. Posterior spinal fixation hardware is seen. IMPRESSION: No active cardiopulmonary disease. Electronically Signed   By: Prudencio Pair M.D.   On: 07/21/2020 00:49   CT Angio Chest PE W and/or Wo Contrast  Result Date: 07/21/2020 CLINICAL DATA:  Shortness of breath, lower extremity edema. EXAM: CT ANGIOGRAPHY CHEST WITH CONTRAST TECHNIQUE: Multidetector CT imaging of the chest was performed using the standard protocol during bolus administration of intravenous contrast. Multiplanar CT image reconstructions and MIPs were obtained to evaluate the vascular anatomy. CONTRAST:  157mL OMNIPAQUE IOHEXOL 350 MG/ML SOLN COMPARISON:  Chest CT dated 05/26/2020 FINDINGS: Cardiovascular: Some of the most peripheral segmental and subsegmental pulmonary artery branches are difficult to definitively characterize due to patient breathing motion artifact, however, there is no pulmonary embolism identified within the main, lobar or central segmental pulmonary artery branches bilaterally. 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 Mild aortic  atherosclerosis. No aortic dissection. No pericardial effusion. Again noted is prominence of the main pulmonary artery trunk, measuring 3.6 cm diameter. Mediastinum/Nodes: Stable mildly prominent lymph node within the aortopulmonary window region, measuring 1.3 cm diameter, stable compared to the earlier chest CT. Also, stable mildly prominent lymph nodes within the anterior mediastinum and RIGHT lower paratracheal space, largest again measuring 1 cm short axis dimension. No new lymph nodes appreciated. Esophagus is unremarkable.  Trachea is unremarkable. Lungs/Pleura: Mosaic pattern throughout both lungs, indicating some degree of air trapping. Additional hazy ground-glass opacities scattered throughout both lungs. No pleural effusion or w pneumothorax. Upper Abdomen: Stable  mass within the RIGHT adrenal gland, measuring 1.3 cm, with CT density measurement compatible with benign adenoma. Limited images of the upper abdomen are otherwise unremarkable. Musculoskeletal: No acute or suspicious osseous finding. Fixation hardware appears intact and appropriately positioned within the lower thoracic spine. Review of the MIP images confirms the above findings. IMPRESSION: 1. Mosaic pattern throughout both lungs, indicating some degree of air trapping, and additional hazy ground-glass opacities scattered throughout both lungs. Differential for the ground-glass opacities includes atypical pneumonias such as viral or fungal, interstitial pneumonias, edema related to volume overload/CHF, chronic interstitial diseases, hypersensitivity pneumonitis, and respiratory bronchiolitis. Early COVID-19 pneumonia can have this appearance. Favor pulmonary edema related to volume overload/CHF. 2. No pulmonary embolism seen, with mild study limitations detailed above. 3. Stable prominence of the main pulmonary artery trunk, measuring 3.6 cm diameter, suggesting chronic pulmonary artery hypertension. 4. Stable mildly prominent lymph nodes  within the mediastinum, the largest again measuring 1.3 cm short axis dimension. Consider additional follow-up chest CT in 12 months to ensure 2 year stability. 5. Benign RIGHT adrenal adenoma. Aortic Atherosclerosis (ICD10-I70.0). Electronically Signed   By: Franki Cabot M.D.   On: 07/21/2020 04:19    Pertinent labs & imaging results that were available during my care of the patient were reviewed by me and considered in my medical decision making (see chart for details).  Medications Ordered in ED Medications  albuterol (PROVENTIL) (2.5 MG/3ML) 0.083% nebulizer solution 5 mg (has no administration in time range)  cefTRIAXone (ROCEPHIN) 1 g in sodium chloride 0.9 % 100 mL IVPB (has no administration in time range)  iohexol (OMNIPAQUE) 350 MG/ML injection 100 mL (100 mLs Intravenous Contrast Given 07/21/20 0343)                                                                                                                                    Procedures Procedures  (including critical care time)  Medical Decision Making / ED Course I have reviewed the nursing notes for this encounter and the patient's prior records (if available in EHR or on provided paperwork).   Laparis Jonet Mathies was evaluated in Emergency Department on 07/21/2020 for the symptoms described in the history of present illness. She was evaluated in the context of the global COVID-19 pandemic, which necessitated consideration that the patient might be at risk for infection with the SARS-CoV-2 virus that causes COVID-19. Institutional protocols and algorithms that pertain to the evaluation of patients at risk for COVID-19 are in a state of rapid change based on information released by regulatory bodies including the CDC and federal and state organizations. These policies and algorithms were followed during the patient's care in the ED.    Clinical Course as of Jul 21 450  Sun Jul 21, 2020  [id number] Patient presented with 2 days of  gradually worsening shortness of breath. Lungs with bibasilar rales. Chest x-ray without evidence of pneumonia or pulmonary edema.  However patient does appear to be volume overloaded, and given rales I favor CHF exacerbation.  She is afebrile.  Satting in the eighties on room air, requiring supplemental oxygen.  Labs notable for leukocytosis.  Will cover with Rocephin.  We will obtain a CTA to better characterize lung sounds and rule out PE.  CTA most consistent with pulmonary edema though atypical pneumonia was also possible.  She is Covid and flu negative.  Patient was given Lasix and admitted to medicine for further work-up and management.   [PC]    Clinical Course User Index [PC] Cass Edinger, Grayce Sessions, MD     Final Clinical Impression(s) / ED Diagnoses Final diagnoses:  Acute on chronic diastolic congestive heart failure ([place])  Acute hypoxemic respiratory failure ([place])      This chart was dictated using voice recognition software.  Despite best efforts to proofread,  errors can occur which can change the documentation meaning.       Fatima Blank, MD 07/21/20 [phone]

## 2020-07-21 NOTE — ED Triage Notes (Signed)
Pt bib [facility] ems for sob present for the pastn couple of days. Per ems patient has hx of chf and leg edema present. Patient was in the 80s on ra and was placed on 15l nrb. Patient is currently on 2Lnc and 100% spo2. Language barrier present, but family is coming up. Patient has bilateral wheezing present.

## 2020-07-21 NOTE — ED Notes (Signed)
Breakfast Ordered 

## 2020-07-21 NOTE — H&P (Addendum)
History and Physical  Ellise Kovack YHC:[id number] DOB: 1945/09/17 DOA: 07/20/2020  Referring physician: Dr. Leonette Monarch, [place] PCP: [name], [place]  Outpatient Specialists: Cardiology. Patient coming from: Home.  Chief Complaint: Shortness of breath.  HPI: Crystal Ashley is a 74 y.o. female with medical history significant for permanent A. fib on Xarelto, hypothyroidism, GERD, type 2 diabetes, obesity, chronic diastolic CHF, hyperlipidemia, bed-bound with pressure ulcer, who presented to [facility] ED with complaints of gradually worsening shortness of breath of 2 days duration.  Associated with orthopnea and hypoxia.  She has been compliant with her home diuretic.  EMS was activated, found to be hypoxic with O2 saturation in the 80's on room air, improved on 2L [place].  She denies chest pain, fevers, cough, or GI symptoms.  Volume overload on exam, imaging consistent with pulmonary edema, bilateral pleural effusions, and concern for acute on chronic diastolic CHF.  Due to leukocytosis, EDP started Rocephin for possible CAP. TRH, hospitalist team, asked to admit.  ED Course: Lab studies remarkable for WBC 17.7K, hemoglobin 7.1K, neutrophil count 12.9, platelet count 503K.  Repeat H&H is pending.    Review of Systems: Review of systems as noted in the HPI. All other systems reviewed and are negative.   Past Medical History:  Diagnosis Date  . Atrial fibrillation ([place])   . Cancer ([place])   . Cataract   . Diabetes mellitus without complication ([place])   . Fracture 2001   left ankle  . Glaucoma   . Hypertension   . Thyroid disease    Past Surgical History:  Procedure Laterality Date  . APPLICATION OF ROBOTIC ASSISTANCE FOR SPINAL PROCEDURE N/A 02/26/2020   Procedure: APPLICATION OF ROBOTIC ASSISTANCE FOR SPINAL PROCEDURE;  Surgeon: [name], MD;  Location: [place];  Service: Neurosurgery;  Laterality: N/A;  . CARDIOVERSION N/A 04/08/2018   Procedure: CARDIOVERSION;  Surgeon: Adrian Prows,  MD;  Location: [place] [facility];  Service: Cardiovascular;  Laterality: N/A;  . CARDIOVERSION N/A 05/22/2019   Procedure: CARDIOVERSION;  Surgeon: Adrian Prows, MD;  Location: [place];  Service: Cardiovascular;  Laterality: N/A;  . FOOT SURGERY Left   . HYSTEROSCOPY     POLYPECTOMY  . LUMBAR PERCUTANEOUS PEDICLE SCREW 4 LEVEL N/A 02/26/2020   Procedure: Thoracic eight to thoracic twelve posterior percutaneous instrumentation with cement augmentation and bilateral medial facetectomy for decompression at Thoracic ten-eleven;  Surgeon: [name], MD;  Location: [place];  Service: Neurosurgery;  Laterality: N/A;    Social History:  reports that she has never smoked. She has never used smokeless tobacco. She reports that she does not drink alcohol and does not use drugs.   Allergies  Allergen Reactions  . 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    Family History  Problem Relation Age of Onset  . Diabetes Son   . Healthy Mother   . Healthy Father       Prior to Admission medications   Medication Sig Start Date End Date Taking? Authorizing Provider  acetaminophen (TYLENOL) 500 MG tablet Take 1,000 mg by mouth every 8 (eight) hours.    [provider]  atorvastatin (LIPITOR) 10 MG tablet Take 1 tablet (10 mg total) by mouth daily. Patient taking differently: Take 10 mg by mouth every evening.  12/01/19   [name], PA  diltiazem (CARDIZEM CD) 240 MG 24 hr capsule Take 1 capsule (240 mg total) by mouth daily. 07/19/20 10/17/20  Adrian Prows, MD  dorzolamide-timolol (COSOPT) 22.3-6.8 MG/ML ophthalmic solution Place 1 drop into both eyes 2 (two) times daily.  10/05/17   [provider]  furosemide (LASIX) 40 MG tablet Take 1 tablet (40 mg total) by mouth daily. 11/29/19   [name], PA  Lancet Devices (LANCET DEVICE WITH EJECTOR) MISC Use to check blood sugar daily and prn  06/10/20   [name], PA  Lancets 33G MISC Use to check blood sugar daily and prn 06/10/20   [name], PA  levothyroxine (SYNTHROID) 75 MCG tablet Take 1 tablet (75 mcg total) by mouth daily before breakfast. 12/01/19   [name], PA  Lidocaine (ASPERCREME LIDOCAINE) 4 % PTCH Place 1 patch onto the skin See admin instructions. Apply 1 patch to right mid back and right hip every morning - remove at night    [provider]  Menthol, Topical Analgesic, (BIOFREEZE) 4 % GEL Apply 1 application topically daily. Apply to right side rib cage and lateral thigh    [provider]  metFORMIN (GLUCOPHAGE) 1000 MG tablet Take 1 tablet (1,000 mg total) by mouth daily with breakfast. 12/01/19   [name], PA  methocarbamol (ROBAXIN) 500 MG tablet Take 1 tablet (500 mg total) by mouth every 6 (six) hours as needed for muscle spasms. 03/01/20   [name], MD  pantoprazole (PROTONIX) 40 MG tablet Take 1 tablet (40 mg total) by mouth daily. Patient taking differently: Take 40 mg by mouth 2 (two) times daily.  03/01/20   [name], MD  polyethylene glycol (MIRALAX / GLYCOLAX) 17 g packet Take 17 g by mouth daily as needed for mild constipation. Patient taking differently: Take 17 g by mouth daily as needed (constipation).  02/12/20   Kayleen Memos, DO  potassium chloride (MICRO-K) 10 MEQ CR capsule Take 10 mEq by mouth daily.    [provider]  rivaroxaban (XARELTO) 20 MG TABS tablet Take 1 tablet (20 mg total) by mouth daily with supper. 07/05/20   Adrian Prows, MD  sennosides (SENOKOT) 8.8 MG/5ML syrup Take 10 mLs by mouth 2 (two) times daily.    [provider]  travoprost, benzalkonium, (TRAVATAN) 0.004 % ophthalmic solution Place 1 drop into both eyes at bedtime.     [provider]  vitamin B-12 (CYANOCOBALAMIN) 250 MCG tablet Take 500 mcg by mouth daily.    [provider]    Physical Exam: BP 115/69   Pulse (!)  52   Temp 98 F (36.7 C) (Oral)   Resp 17   Ht 5' (1.524 m)   Wt 84.6 kg   SpO2 97%   BMI 36.43 kg/m   . General: 74 y.o. year-old female well developed well nourished in no acute distress.  Alert and interactive. . Cardiovascular: Regular rate and rhythm with no rubs or gallops.  No thyromegaly or JVD noted.  2+ pitting lower extremity edema in lower extremities. Marland Kitchen Respiratory: Mild rales at bases, no wheezes.  Poor inspiratory effort. . Abdomen: Soft nontender nondistended with normal bowel sounds x4 quadrants. . Muskuloskeletal: No cyanosis or clubbing. 2+ pitting edema noted bilaterally in LE. Marland Kitchen Neuro: CN II-XII intact, strength, sensation, reflexes . Skin: Pressure ulcers, POA . Psychiatry: Judgement and insight appear normal. Mood is appropriate for condition and setting          Labs on Admission:  Basic Metabolic Panel: Recent Labs  Lab 07/21/20 0018  NA 136  K 3.5  CL 95*  CO2  29  GLUCOSE 250*  BUN 15  CREATININE 0.96  CALCIUM 8.9   Liver Function Tests: Recent Labs  Lab 07/21/20 0018  AST 19  ALT 8  ALKPHOS 73  BILITOT 1.2  PROT 6.6  ALBUMIN 2.8*   No results for input(s): LIPASE, AMYLASE in the last 168 hours. No results for input(s): AMMONIA in the last 168 hours. CBC: Recent Labs  Lab 07/21/20 0018  WBC 17.7*  NEUTROABS 12.9*  HGB 7.1*  HCT 24.5*  MCV 92.8  PLT 503*   Cardiac Enzymes: No results for input(s): CKTOTAL, CKMB, CKMBINDEX, TROPONINI in the last 168 hours.  BNP (last 3 results) Recent Labs    05/22/20 1128 05/26/20 1142 07/21/20 0125  BNP 270.8* 563.2* 115.0*    ProBNP (last 3 results) No results for input(s): PROBNP in the last 8760 hours.  CBG: No results for input(s): GLUCAP in the last 168 hours.  Radiological Exams on Admission: DG Chest 2 View  Result Date: 07/21/2020 CLINICAL DATA:  Shortness of breath EXAM: CHEST - 2 VIEW COMPARISON:  None. FINDINGS: The heart size and mediastinal contours are within  normal limits. Both lungs are clear. Posterior spinal fixation hardware is seen. IMPRESSION: No active cardiopulmonary disease. Electronically Signed   By: Prudencio Pair M.D.   On: 07/21/2020 00:49   CT Angio Chest PE W and/or Wo Contrast  Result Date: 07/21/2020 CLINICAL DATA:  Shortness of breath, lower extremity edema. EXAM: CT ANGIOGRAPHY CHEST WITH CONTRAST TECHNIQUE: Multidetector CT imaging of the chest was performed using the standard protocol during bolus administration of intravenous contrast. Multiplanar CT image reconstructions and MIPs were obtained to evaluate the vascular anatomy. CONTRAST:  142mL OMNIPAQUE IOHEXOL 350 MG/ML SOLN COMPARISON:  Chest CT dated 05/26/2020 FINDINGS: Cardiovascular: Some of the most peripheral segmental and subsegmental pulmonary artery branches are difficult to definitively characterize due to patient breathing motion artifact, however, there is no pulmonary embolism identified within the main, lobar or central segmental pulmonary artery branches bilaterally. 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 Mild aortic atherosclerosis. No aortic dissection. No pericardial effusion. Again noted is prominence of the main pulmonary artery trunk, measuring 3.6 cm diameter. Mediastinum/Nodes: Stable mildly prominent lymph node within the aortopulmonary window region, measuring 1.3 cm diameter, stable compared to the earlier chest CT. Also, stable mildly prominent lymph nodes within the anterior mediastinum and RIGHT lower paratracheal space, largest again measuring 1 cm short axis dimension. No new lymph nodes appreciated. Esophagus is unremarkable.  Trachea is unremarkable. Lungs/Pleura: Mosaic pattern throughout both lungs, indicating some degree of air trapping. Additional hazy ground-glass opacities scattered throughout both lungs. No pleural effusion or w pneumothorax. Upper Abdomen: Stable mass within the RIGHT adrenal gland,  measuring 1.3 cm, with CT density measurement compatible with benign adenoma. Limited images of the upper abdomen are otherwise unremarkable. Musculoskeletal: No acute or suspicious osseous finding. Fixation hardware appears intact and appropriately positioned within the lower thoracic spine. Review of the MIP images confirms the above findings. IMPRESSION: 1. Mosaic pattern throughout both lungs, indicating some degree of air trapping, and additional hazy ground-glass opacities scattered throughout both lungs. Differential for the ground-glass opacities includes atypical pneumonias such as viral or fungal, interstitial pneumonias, edema related to volume overload/CHF, chronic interstitial diseases, hypersensitivity pneumonitis, and respiratory bronchiolitis. Early COVID-19 pneumonia can have this appearance. Favor pulmonary edema related to volume overload/CHF. 2. No pulmonary embolism seen, with mild study limitations detailed above. 3. Stable prominence of the  main pulmonary artery trunk, measuring 3.6 cm diameter, suggesting chronic pulmonary artery hypertension. 4. Stable mildly prominent lymph nodes within the mediastinum, the largest again measuring 1.3 cm short axis dimension. Consider additional follow-up chest CT in 12 months to ensure 2 year stability. 5. Benign RIGHT adrenal adenoma. Aortic Atherosclerosis (ICD10-I70.0). Electronically Signed   By: Franki Cabot M.D.   On: 07/21/2020 04:19    EKG: I independently viewed the EKG done and my findings are as followed: Afib rate 104.  No specific ST T changes.   Assessment/Plan Present on Admission: . Acute respiratory failure with hypoxia (HCC)  Active Problems:   Acute respiratory failure with hypoxia (HCC)   Acute hypoxic respiratory failure secondary to pulmonary edema, bilateral pleural effusion Presented with O2 saturation in the 80s on room air, improved on 2L  Independently reviewed chest x-ray done on admission which showed  cardiomegaly, increase in pulmonary vascularity and bilateral pleural effusions CTA chest negative for PE Currently on 2 L with O2 saturation in the mid 90s Maintain O2 saturation greater than 92%  Leukocytosis, possibly reactive Obtain UA, urine culture, blood cultures, and procalcitonin level Presented with WBC 17K. Started on Rocephin in the ED, continue for now until active infective process is ruled out  Acute on chronic diastolic CHF Presented with dyspnea, hypoxia, orthopnea, B/L LE pitting edema Chest x-ray consistent with pulmonary edema and bilateral pleural effusions Last 2D echo done on 02/28/2020 showed LVEF 55 to 70% with no regional wall motion abnormalities. Start low-dose IV Lasix 20 mg twice daily due to soft blood pressures Increase IV lasix dose as BP tolerates. Strict I's and O's and daily weight Obtain 2D echo  Bilateral pleural effusions, suspect cardiogenic Ongoing diuresing with IV lasix Start incentive spirometer and flutter valve Will need home O2 evaluation prior to DC  Acute blood loss anemia Baseline hemoglobin appears to be 9 No prior GI evaluation Presented with hemoglobin of 7.1, MCV 92 No overt bleeding Obtain FOBT and iron studies May consider GI referral for anemia workup  Type 2 diabetes with hyperglycemia Obtain hemoglobin A1c Start insulin sliding scale  Permanent A. fib on Xarelto She is on p.o. diltiazem for rate control On Xarelto for CVA prevention Continue to monitor on telemetry  Hypothyroidism Obtain TSH Resume home levothyroxine  GERD Stable Resume PPI  Physical deconditioning PT OT to assess Fall precautions  Bed-bound with pressure ulcers, POA Wound care specialist consult Local wound care   DVT prophylaxis: Xarelto  Code Status: DNR per her son Prashant via phone   Family Communication: Updated her son via phone.  Disposition Plan: Admit to telemetry cardiac  Consults called: None.  Admission status:  Inpatient status.  Patient will require at least 2 midnight for further evaluation and treatment of present condition.   Status is: Inpatient    Dispo:  Patient From: Home  Planned Disposition: Home with Health Care Svc  Expected discharge date: 07/23/20  Medically stable for discharge: No, ongoing diuresing, management of acute hypoxic respiratory failure, acute on chronic diastolic CHF, uncontrolled diabetes.        Kayleen Memos MD [facility] Pager [phone]  If 7PM-7AM, please contact night-coverage [email or web address] Password [email or web address]  07/21/2020, 5:00 AM

## 2020-07-21 NOTE — Plan of Care (Signed)
Educated patient and son on above information

## 2020-07-22 ENCOUNTER — Inpatient Hospital Stay: Payer: Self-pay

## 2020-07-22 ENCOUNTER — Inpatient Hospital Stay (HOSPITAL_COMMUNITY): Payer: Medicare Other

## 2020-07-22 DIAGNOSIS — I5031 Acute diastolic (congestive) heart failure: Secondary | ICD-10-CM

## 2020-07-22 LAB — PHOSPHORUS: Phosphorus: 3.6 mg/dL (ref 2.5–4.6)

## 2020-07-22 LAB — ECHOCARDIOGRAM COMPLETE
Height: 60 in
S' Lateral: 2.6 cm
Weight: 2984.15 oz

## 2020-07-22 LAB — COMPREHENSIVE METABOLIC PANEL
ALT: 10 U/L (ref 0–44)
AST: 12 U/L — ABNORMAL LOW (ref 15–41)
Albumin: 2.9 g/dL — ABNORMAL LOW (ref 3.5–5.0)
Alkaline Phosphatase: 55 U/L (ref 38–126)
Anion gap: 9 (ref 5–15)
BUN: 10 mg/dL (ref 8–23)
CO2: 33 mmol/L — ABNORMAL HIGH (ref 22–32)
Calcium: 8.9 mg/dL (ref 8.9–10.3)
Chloride: 98 mmol/L (ref 98–111)
Creatinine, Ser: 0.85 mg/dL (ref 0.44–1.00)
GFR, Estimated: >60 mL/min (ref >60)
Glucose, Bld: 162 mg/dL — ABNORMAL HIGH (ref 70–99)
Potassium: 3.5 mmol/L (ref 3.5–5.1)
Sodium: 140 mmol/L (ref 135–145)
Total Bilirubin: 1 mg/dL (ref 0.3–1.2)
Total Protein: 6.1 g/dL — ABNORMAL LOW (ref 6.5–8.1)

## 2020-07-22 LAB — URINE CULTURE

## 2020-07-22 LAB — HEMOGLOBIN A1C
Hgb A1c MFr Bld: 6 % — ABNORMAL HIGH (ref 4.8–5.6)
Mean Plasma Glucose: 125.5 mg/dL

## 2020-07-22 LAB — GLUCOSE, CAPILLARY
Glucose-Capillary: 130 mg/dL — ABNORMAL HIGH (ref 70–99)
Glucose-Capillary: 147 mg/dL — ABNORMAL HIGH (ref 70–99)
Glucose-Capillary: 159 mg/dL — ABNORMAL HIGH (ref 70–99)
Glucose-Capillary: 197 mg/dL — ABNORMAL HIGH (ref 70–99)

## 2020-07-22 LAB — PATHOLOGIST SMEAR REVIEW: Path Review: 12062021

## 2020-07-22 LAB — CBC WITH DIFFERENTIAL/PLATELET
Abs Immature Granulocytes: 0.06 10*3/uL (ref 0.00–0.07)
Basophils Absolute: 0 10*3/uL (ref 0.0–0.1)
Basophils Relative: 0 %
Eosinophils Absolute: 1.5 10*3/uL — ABNORMAL HIGH (ref 0.0–0.5)
Eosinophils Relative: 14 %
HCT: 24 % — ABNORMAL LOW (ref 36.0–46.0)
Hemoglobin: 7.1 g/dL — ABNORMAL LOW (ref 12.0–15.0)
Immature Granulocytes: 1 %
Lymphocytes Relative: 14 %
Lymphs Abs: 1.4 10*3/uL (ref 0.7–4.0)
MCH: 26.7 pg (ref 26.0–34.0)
MCHC: 29.6 g/dL — ABNORMAL LOW (ref 30.0–36.0)
MCV: 90.2 fL (ref 80.0–100.0)
Monocytes Absolute: 0.4 10*3/uL (ref 0.1–1.0)
Monocytes Relative: 4 %
Neutro Abs: 7 10*3/uL (ref 1.7–7.7)
Neutrophils Relative %: 67 %
Platelets: 325 10*3/uL (ref 150–400)
RBC: 2.66 MIL/uL — ABNORMAL LOW (ref 3.87–5.11)
RDW: 18.7 % — ABNORMAL HIGH (ref 11.5–15.5)
WBC: 10.5 10*3/uL (ref 4.0–10.5)
nRBC: 0 % (ref 0.0–0.2)

## 2020-07-22 LAB — LIPID PANEL
Cholesterol: 98 mg/dL (ref 0–200)
HDL: 30 mg/dL — ABNORMAL LOW (ref >40)
LDL Cholesterol: 51 mg/dL (ref 0–99)
Total CHOL/HDL Ratio: 3.3 RATIO
Triglycerides: 87 mg/dL (ref <150)
VLDL: 17 mg/dL (ref 0–40)

## 2020-07-22 LAB — TSH: TSH: 2.717 u[IU]/mL (ref 0.350–4.500)

## 2020-07-22 LAB — MAGNESIUM: Magnesium: 1.8 mg/dL (ref 1.7–2.4)

## 2020-07-22 LAB — OCCULT BLOOD X 1 CARD TO LAB, STOOL: Fecal Occult Bld: NEGATIVE

## 2020-07-22 LAB — PREPARE RBC (CROSSMATCH)

## 2020-07-22 IMAGING — DX DG CHEST 1V PORT
1 series · 1 of 1 positions shown · non-contrast
Comparison: [DATE]

CLINICAL DATA: Status post PICC line placement.

EXAM:
PORTABLE CHEST 1 VIEW

[chest ap]
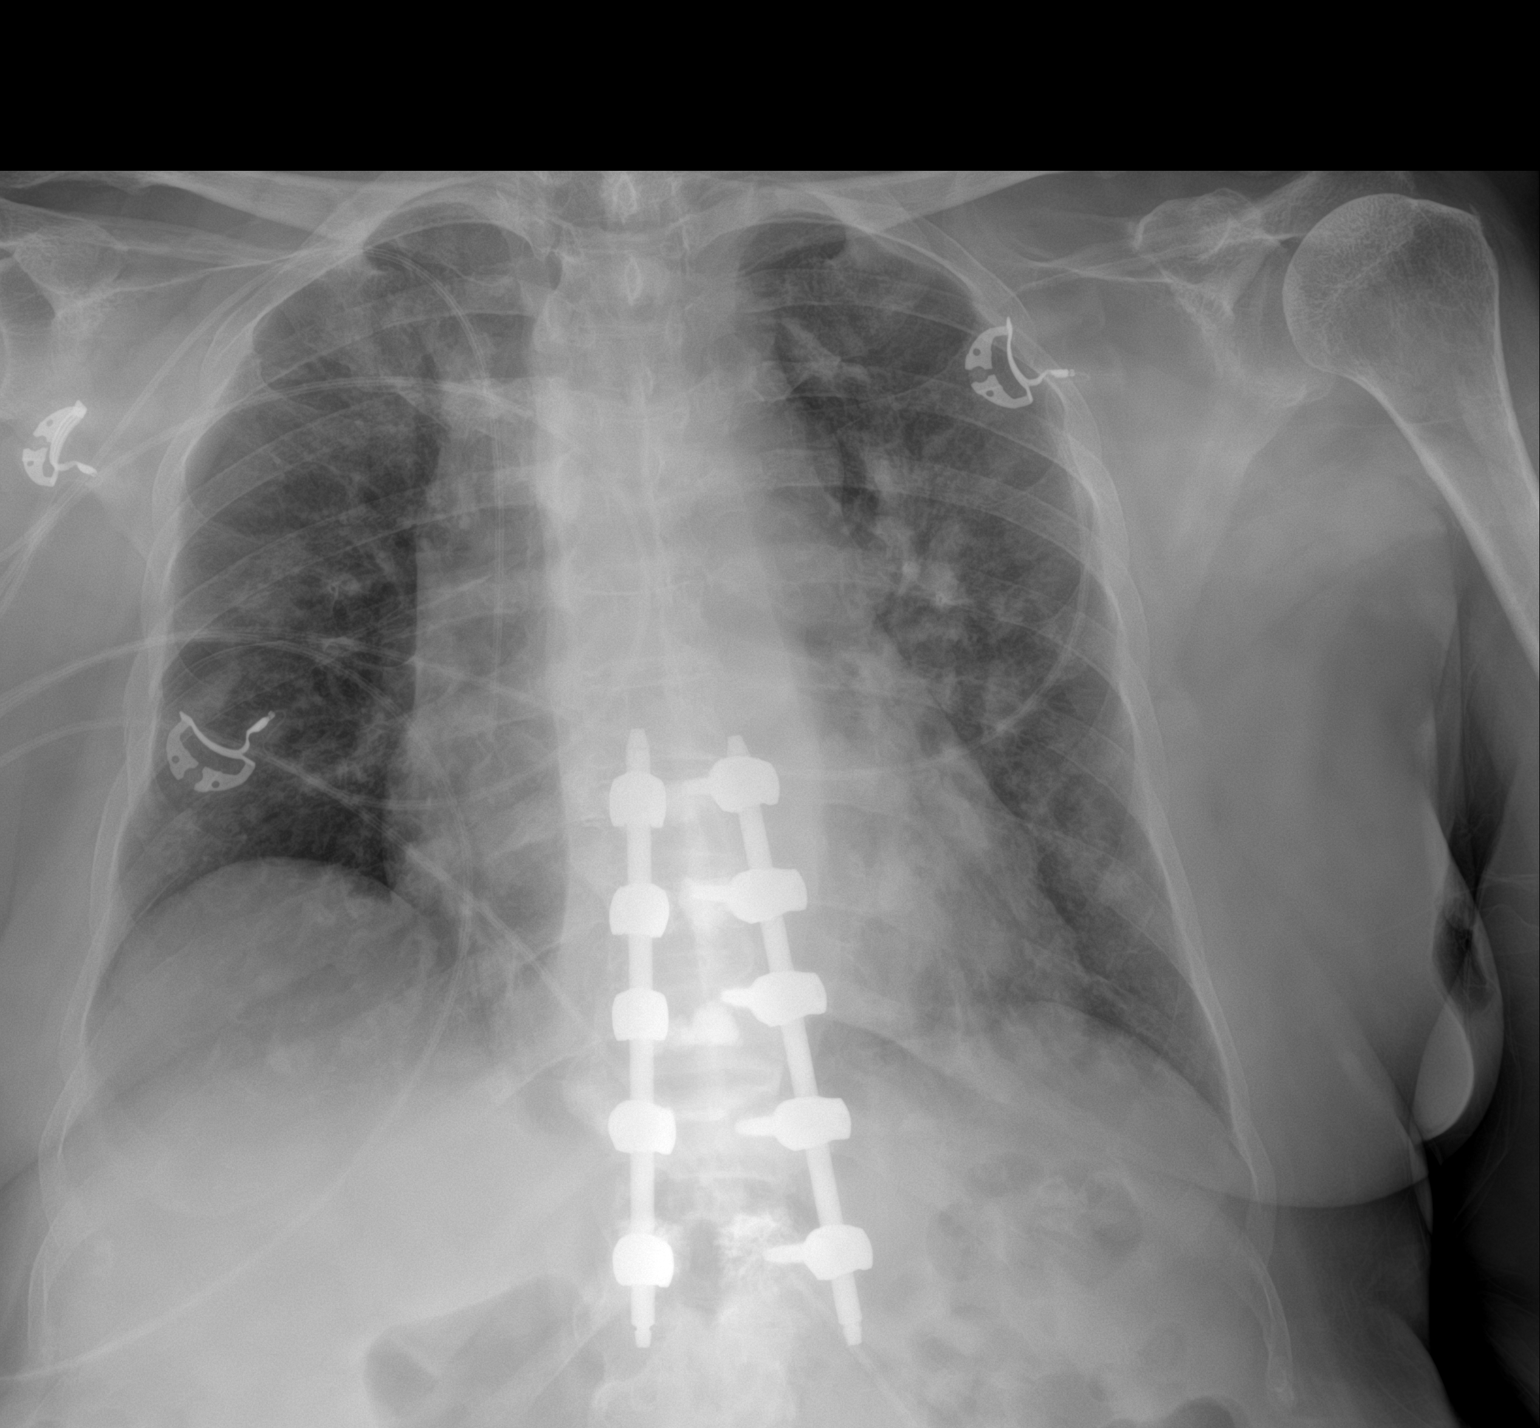

[1 of 1 positions shown; findings below may reference images not displayed]

FINDINGS: A right-sided PICC line is seen with its distal tip noted just
beyond the junction of the superior vena cava and right atrium. Mild
diffuse hazy areas of atelectasis and/or infiltrate are seen
throughout both lungs. There is no evidence of a pleural effusion or
pneumothorax. The cardiac silhouette is moderately enlarged.
Bilateral pedicle screws are seen within the lower thoracic spine.
IMPRESSION: Right-sided PICC line with its distal tip noted just beyond the
junction of the superior vena cava and right atrium.

## 2020-07-22 MED ORDER — SODIUM CHLORIDE 0.9% IV SOLUTION: Freq: Once | INTRAVENOUS | Status: AC

## 2020-07-22 MED ORDER — CHLORHEXIDINE GLUCONATE CLOTH 2 % EX PADS
6.0000 | MEDICATED_PAD | Freq: Every day | CUTANEOUS | Status: DC
  Administered 2020-07-22 – 2020-07-24 (×3): 6 via TOPICAL

## 2020-07-22 MED ORDER — FUROSEMIDE 10 MG/ML IJ SOLN
20.0000 mg | Freq: Once | INTRAMUSCULAR | Status: AC
  Administered 2020-07-23: 20 mg via INTRAVENOUS
  Filled 2020-07-22: qty 2

## 2020-07-22 MED ORDER — DIPHENHYDRAMINE HCL 25 MG PO CAPS
25.0000 mg | ORAL_CAPSULE | Freq: Once | ORAL | Status: AC
  Administered 2020-07-23: 25 mg via ORAL
  Filled 2020-07-22: qty 1

## 2020-07-22 MED ORDER — SODIUM CHLORIDE 0.9% FLUSH
10.0000 mL | Freq: Two times a day (BID) | INTRAVENOUS | Status: DC
  Administered 2020-07-22: 10 mL
  Administered 2020-07-23: 20 mL
  Administered 2020-07-23 – 2020-07-26 (×6): 10 mL

## 2020-07-22 MED ORDER — DILTIAZEM HCL ER COATED BEADS 120 MG PO CP24
120.0000 mg | ORAL_CAPSULE | Freq: Every day | ORAL | Status: DC
  Administered 2020-07-22 – 2020-07-26 (×5): 120 mg via ORAL
  Filled 2020-07-22 (×5): qty 1

## 2020-07-22 MED ORDER — ACETAMINOPHEN 325 MG PO TABS
650.0000 mg | ORAL_TABLET | Freq: Once | ORAL | Status: AC
  Administered 2020-07-23: 650 mg via ORAL
  Filled 2020-07-22: qty 2

## 2020-07-22 MED ORDER — SODIUM CHLORIDE 0.9% FLUSH: 10.0000 mL | INTRAVENOUS | Status: DC | PRN

## 2020-07-22 NOTE — Progress Notes (Signed)
PROGRESS NOTE   Crystal Ashley  NOM:[id number] DOB: June 26, 1946 DOA: 07/20/2020 PCP: [name], PA  Brief Narrative:  74 year old [place] resident A. fib CHADS2 score >4 DM TY 2, stage I uterine cancer status post hysterectomy, prior admissions for pneumonia, pelvic fracture 05/22/2020 mild cognitive issues Last admitted 10/10 through 05/29/2020 with acute respiratory failure secondary to volume overload  Readmit with S OB orthopnea hypoxia-O2 sat 80% improved on 2 L-CXR = pulmonary edema   Assessment & Plan:   Active Problems:   Atrial fibrillation with RVR (HCC)   Acute respiratory failure with hypoxia (HCC)   Bilateral pleural effusion   Pulmonary edema   Acute on chronic diastolic CHF (congestive heart failure) (HCC)   Chronic blood loss anemia   Controlled type 2 diabetes mellitus with hyperglycemia (HCC)   Hypothyroidism, adult   Physical deconditioning   1. Hypoxic respiratory failure secondary to pulmonary edema and acute decompensated HFpEF a. Is slightly wheezy and is requiring oxygen, is -1.3 L since admission b. Repeat CXR in a.m. 2. Bilateral pleural effusions 3. Blood loss anemia a. Etiology unclear-probably from chronic disease as hemoglobin trends between 7 and 8 b. Get iron studies c. Transfused 1 unit of blood today 4. Permanent A. fib on Xarelto in RVR a. On amiodarone gtt. at 16-we will add Cardizem 120 CD (home dose is 240) and we will attempt to control the same b. If further difficulties may need to discuss with cardiology c. Given no dark or tarry stool would continue Xarelto at this time 5. Hypothyroid a. Get TSH in the next 2 weeks continue Synthroid 75 mcg 6. Bedbound state a. Will return to skilled?  Therapy eval's are pending 7. DM TY 2 A1c 6.1 a. CBGs ranging 1 30-1 47 on sliding scale b. Metformin 1000 twice a.m. from prior to admission on hold 8. Reflux a. Continue Protonix 40 daily 9. Probable leukocytosis from reactive  process  DVT prophylaxis: Xarelto Code Status: DNR Family Communication:  Disposition:  Status is: Inpatient  Remains inpatient appropriate because:Hemodynamically unstable, Ongoing active pain requiring inpatient pain management and Altered mental status   Dispo:  Patient From: Home  Planned Disposition: Home with Health Care Svc  Expected discharge date: 07/23/20  Medically stable for discharge: No        Consultants:   None  Procedures: No  Antimicrobials: None   Subjective: Awake coherent in nad no focal deficit but language remains a barrier No fever RN reports stool was not bloody when examined and hemoccult sent  Objective: Vitals:   07/22/20 0100 07/22/20 0550 07/22/20 0746 07/22/20 1137  BP: 106/86 118/62 (!) 112/59 (!) 147/93  Pulse: 95 91 79 (!) 104  Resp: 18 18 18 18   Temp:  (!) 97.4 F (36.3 C) 98.2 F (36.8 C) 98.4 F (36.9 C)  TempSrc:  Oral Oral Oral  SpO2:   100% 98%  Weight:      Height:        Intake/Output Summary (Last 24 hours) at 07/22/2020 1232 Last data filed at 07/22/2020 1139 Gross per 24 hour  Intake --  Output 1300 ml  Net -1300 ml   Filed Weights   07/21/20 0007  Weight: 84.6 kg    Examination:  eomi ncat s1 s2 no m r gallop in Afib chest has some rale sand rhonchi abd soft nt nd no rebound Le not swelling Neuro intact moving all 4 limbs equally   Data Reviewed: I have personally reviewed following labs  and imaging studies Sodium 148 potassium 3.5 BUNs/creatinine 10/0.8 WBC 10.5 hemoglobin 7.1 platelet 325  Radiology Studies: DG Chest 2 View  Result Date: 07/21/2020 CLINICAL DATA:  Shortness of breath EXAM: CHEST - 2 VIEW COMPARISON:  None. FINDINGS: The heart size and mediastinal contours are within normal limits. Both lungs are clear. Posterior spinal fixation hardware is seen. IMPRESSION: No active cardiopulmonary disease. Electronically Signed   By: Prudencio Pair M.D.   On: 07/21/2020 00:49   CT Angio  Chest PE W and/or Wo Contrast  Result Date: 07/21/2020 CLINICAL DATA:  Shortness of breath, lower extremity edema. EXAM: CT ANGIOGRAPHY CHEST WITH CONTRAST TECHNIQUE: Multidetector CT imaging of the chest was performed using the standard protocol during bolus administration of intravenous contrast. Multiplanar CT image reconstructions and MIPs were obtained to evaluate the vascular anatomy. CONTRAST:  142mL OMNIPAQUE IOHEXOL 350 MG/ML SOLN COMPARISON:  Chest CT dated 05/26/2020 FINDINGS: Cardiovascular: Some of the most peripheral segmental and subsegmental pulmonary artery branches are difficult to definitively characterize due to patient breathing motion artifact, however, there is no pulmonary embolism identified within the main, lobar or central segmental pulmonary artery branches bilaterally. 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 Mild aortic atherosclerosis. No aortic dissection. No pericardial effusion. Again noted is prominence of the main pulmonary artery trunk, measuring 3.6 cm diameter. Mediastinum/Nodes: Stable mildly prominent lymph node within the aortopulmonary window region, measuring 1.3 cm diameter, stable compared to the earlier chest CT. Also, stable mildly prominent lymph nodes within the anterior mediastinum and RIGHT lower paratracheal space, largest again measuring 1 cm short axis dimension. No new lymph nodes appreciated. Esophagus is unremarkable.  Trachea is unremarkable. Lungs/Pleura: Mosaic pattern throughout both lungs, indicating some degree of air trapping. Additional hazy ground-glass opacities scattered throughout both lungs. No pleural effusion or w pneumothorax. Upper Abdomen: Stable mass within the RIGHT adrenal gland, measuring 1.3 cm, with CT density measurement compatible with benign adenoma. Limited images of the upper abdomen are otherwise unremarkable. Musculoskeletal: No acute or suspicious osseous finding. Fixation  hardware appears intact and appropriately positioned within the lower thoracic spine. Review of the MIP images confirms the above findings. IMPRESSION: 1. Mosaic pattern throughout both lungs, indicating some degree of air trapping, and additional hazy ground-glass opacities scattered throughout both lungs. Differential for the ground-glass opacities includes atypical pneumonias such as viral or fungal, interstitial pneumonias, edema related to volume overload/CHF, chronic interstitial diseases, hypersensitivity pneumonitis, and respiratory bronchiolitis. Early COVID-19 pneumonia can have this appearance. Favor pulmonary edema related to volume overload/CHF. 2. No pulmonary embolism seen, with mild study limitations detailed above. 3. Stable prominence of the main pulmonary artery trunk, measuring 3.6 cm diameter, suggesting chronic pulmonary artery hypertension. 4. Stable mildly prominent lymph nodes within the mediastinum, the largest again measuring 1.3 cm short axis dimension. Consider additional follow-up chest CT in 12 months to ensure 2 year stability. 5. Benign RIGHT adrenal adenoma. Aortic Atherosclerosis (ICD10-I70.0). Electronically Signed   By: Franki Cabot M.D.   On: 07/21/2020 04:19     Scheduled Meds: . albuterol  5 mg Nebulization Once  . atorvastatin  10 mg Oral Daily  . furosemide  20 mg Intravenous BID  . insulin aspart  0-5 Units Subcutaneous QHS  . insulin aspart  0-9 Units Subcutaneous TID WC  . levothyroxine  75 mcg Oral QAC breakfast  . pantoprazole  40 mg Oral BID  . potassium chloride  20 mEq Oral Daily  . rivaroxaban  20 mg Oral Q supper  . Travoprost (BAK Free)  1 drop Both Eyes QHS  . vitamin B-12  500 mcg Oral Daily   Continuous Infusions: . amiodarone 30 mg/hr (07/21/20 2226)  . cefTRIAXone (ROCEPHIN)  IV 1 g (07/22/20 1013)     LOS: 1 day    Time spent: [place], MD [facility] To contact the attending provider between 7A-7P or the  covering provider during after hours 7P-7A, please log into the web site [email or web address] and access using universal [place] password for that web site. If you do not have the password, please call the hospital operator.  07/22/2020, 12:32 PM

## 2020-07-22 NOTE — Evaluation (Signed)
Physical Therapy Evaluation Patient Details Name: Crystal Ashley MRN: [id number] DOB: 1946-05-09 Today's Date: 07/22/2020   History of Present Illness  Pt is a 74 year old woman admitted on 07/21/20 with SOB with hypoxia x 2 days. + volume overload, B pleural effusions. 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  Clinical Impression  Pt admitted with above diagnosis and presents to PT with functional limitations due to deficits listed below (See PT problem list). Pt needs skilled PT to maximize independence and safety to allow discharge back to home with supportive family. Has extensive equipment already at home.      Follow Up Recommendations Home health PT;Supervision/Assistance - 24 hour    Equipment Recommendations  None recommended by PT    Recommendations for Other Services       Precautions / Restrictions Precautions Precautions: Fall Precaution Comments: watch HR and 02      Mobility  Bed Mobility Overal bed mobility: Needs Assistance Bed Mobility: Rolling;Sidelying to Sit;Sit to Supine Rolling: Mod assist Sidelying to sit: +2 for physical assistance;Max assist   Sit to supine: +2 for physical assistance;Total assist   General bed mobility comments: Assist to bring legs off of bed, elevate trunk into sitting and bring hips to EOB. Assist for all aspects to return to supine    Transfers Overall transfer level: Needs assistance   Transfers: Sit to/from Stand Sit to Stand: +2 physical assistance;Mod assist;From elevated surface         General transfer comment: with use of gait belt and bed pad under hips, pt stood with increased time and encouragement  Ambulation/Gait                Stairs            Wheelchair Mobility    Modified Rankin (Stroke Patients Only)       Balance Overall balance assessment: Needs assistance Sitting-balance  support: Single extremity supported Sitting balance-Leahy Scale: Poor Sitting balance - Comments: Sat EOB x 15 minutes with min to min guard assist and frequent verbal cues to bring weight anteriorly Postural control: Posterior lean Standing balance support: Bilateral upper extremity supported Standing balance-Leahy Scale: Poor                               Pertinent Vitals/Pain Pain Assessment: Faces Faces Pain Scale: Hurts even more Pain Location: L arm at IV site Pain Descriptors / Indicators: Grimacing;Guarding;Discomfort Pain Intervention(s): Monitored during session;Repositioned    Home Living Family/patient expects to be discharged to:: Private residence Living Arrangements: Children;Non-relatives/Friends;Other relatives;Other (Comment) (son, son's girlfriend, grandson) Available Help at Discharge: Family;Available 24 hours/day;Personal care attendant (aide comes daily) Type of Home: House Home Access: Ramped entrance     Home Layout: Two level;Able to live on main level with bedroom/bathroom Home Equipment: Shower seat;Cane - single point;Walker - standard;Walker - 4 wheels;Hospital bed;Wheelchair - Education officer, community - power;Other (comment) (hoyer lift) Additional Comments: Sit to stand lift    Prior Function Level of Independence: Needs assistance   Gait / Transfers Assistance Needed: working on standing and taking up to 10 steps with HHPT  ADL's / Homemaking Assistance Needed: self feeds and participates in grooming, otherwise dependent        Hand Dominance   Dominant Hand: Right    Extremity/Trunk Assessment   Upper Extremity Assessment Upper Extremity Assessment: Defer to  OT evaluation    Lower Extremity Assessment Lower Extremity Assessment: RLE deficits/detail;LLE deficits/detail RLE Deficits / Details: Strength grossly 3-/5 LLE Deficits / Details: Strength grossly 3-/5    Cervical / Trunk Assessment Cervical / Trunk Assessment: Other  exceptions Cervical / Trunk Exceptions: obesity, hx of spinal sx  Communication   Communication: Prefers language other than English (grandson in room, pt speaks some [place], but prefers Hindi)  Cognition Arousal/Alertness: Awake/alert Behavior During Therapy: Anxious Overall Cognitive Status: History of cognitive impairments - at baseline                                        General Comments General comments (skin integrity, edema, etc.): SpO2 100% with 2L. On RA 87-88%. Pt with c/o's of dizziness with sitting BP 140/92    Exercises     Assessment/Plan    PT Assessment Patient needs continued PT services  PT Problem List Decreased strength;Decreased activity tolerance;Decreased balance;Decreased mobility;Obesity       PT Treatment Interventions DME instruction;Gait training;Functional mobility training;Therapeutic activities;Therapeutic exercise;Balance training;Patient/family education    PT Goals (Current goals can be found in the Care Plan section)  Acute Rehab PT Goals Patient Stated Goal: return home PT Goal Formulation: With patient Time For Goal Achievement: 08/05/20 Potential to Achieve Goals: Fair    Frequency Min 2X/week   Barriers to discharge        Co-evaluation PT/OT/SLP Co-Evaluation/Treatment: Yes Reason for Co-Treatment: For patient/therapist safety PT goals addressed during session: Mobility/safety with mobility;Balance OT goals addressed during session: ADL's and self-care;Strengthening/ROM       AM-PAC PT "6 Clicks" Mobility  Outcome Measure Help needed turning from your back to your side while in a flat bed without using bedrails?: A Lot Help needed moving from lying on your back to sitting on the side of a flat bed without using bedrails?: Total Help needed moving to and from a bed to a chair (including a wheelchair)?: Total Help needed standing up from a chair using your arms (e.g., wheelchair or bedside chair)?: A  Lot Help needed to walk in hospital room?: Total Help needed climbing 3-5 steps with a railing? : Total 6 Click Score: 8    End of Session Equipment Utilized During Treatment: Gait belt;Oxygen Activity Tolerance: Patient limited by fatigue Patient left: in bed;with call bell/phone within reach;with family/visitor present Nurse Communication: Mobility status;Need for lift equipment PT Visit Diagnosis: Other abnormalities of gait and mobility (R26.89);Muscle weakness (generalized) (M62.81);History of falling (Z91.81)    Time: 1100-1130 PT Time Calculation (min) (ACUTE ONLY): 30 min   Charges:   PT Evaluation $PT Eval Moderate Complexity: [place] Pager [phone] Office [place] 07/22/2020, 12:26 PM

## 2020-07-22 NOTE — Progress Notes (Signed)
Peripherally Inserted Central Catheter Placement  The IV Nurse has discussed with the patient and/or persons authorized to consent for the patient, the purpose of this procedure and the potential benefits and risks involved with this procedure.  The benefits include less needle sticks, lab draws from the catheter, and the patient may be discharged home with the catheter. Risks include, but not limited to, infection, bleeding, blood clot (thrombus formation), and puncture of an artery; nerve damage and irregular heartbeat and possibility to perform a PICC exchange if needed/ordered by physician.  Alternatives to this procedure were also discussed.  Bard Power PICC patient education guide, fact sheet on infection prevention and patient information card has been provided to patient /or left at bedside.  Procedure explained to son on the phone while discussing with patient.  Both are in agreement, questions answered.  PICC Placement Documentation  PICC Triple Lumen 38/88/75 PICC Right Basilic 38 cm 0 cm (Active)  Indication for Insertion or Continuance of Line Vasoactive infusions;Limited venous access - need for IV therapy >5 days (PICC only);Poor Vasculature-patient has had multiple peripheral attempts or PIVs lasting less than 24 hours 07/22/20 1639  Exposed Catheter (cm) 0 cm 07/22/20 1639  Site Assessment Clean;Dry;Intact 07/22/20 1639  Lumen #1 Status Flushed;Saline locked;Blood return noted 07/22/20 1639  Lumen #2 Status Flushed;Saline locked;Blood return noted 07/22/20 1639  Lumen #3 Status Flushed;Saline locked;Blood return noted 07/22/20 1639  Dressing Type Transparent 07/22/20 1639  Dressing Status Clean;Dry;Intact 07/22/20 1639  Antimicrobial disc in place? Yes 07/22/20 1639  Safety Lock Not Applicable 79/72/82 0601  Line Care Connections checked and tightened 07/22/20 1639  Line Adjustment (NICU/IV Team Only) No 07/22/20 1639  Dressing Intervention New dressing 07/22/20 5615  Dressing  Change Due 07/29/20 07/22/20 [place], Nicolette Bang 07/22/2020, 4:40 PM

## 2020-07-22 NOTE — Consult Note (Signed)
[place] Nurse Consult Note: Patient receiving care in [place].  Primary RN present at time of my assessment. Reason for Consult: "pressure ulcers" Wound type: superficial fissure to apex of gluteal fold due to MASD both ITD and IAD Measurement: approximately 1.5 cm length and 0.3 cm width, no depth Wound bed: pinkish Drainage (amount, consistency, odor) none Periwound: intact Dressing procedure/placement/frequency: Place a narrow strip of Aquacel Kellie Simmering [phone]) into and over the fissure in the gluteal fold at the apex. Cover with a foam dressing.  Change the strip of Aquacel daily.  The foam can be used up to 5 days. I have also added an order for bilateral Prevalon heel lift boots and a standard size bed with low air loss feature. Monitor the wound area(s) for worsening of condition such as: Signs/symptoms of infection,  Increase in size,  Development of or worsening of odor, Development of pain, or increased pain at the affected locations.  Notify the medical team if any of these develop.  Thank you for the consult.  Discussed plan of care with the bedside nurse.  [place] nurse will not follow at this time.  Please re-consult the Center team if needed.  Val Riles, RN, MSN, CWOCN, CNS-BC, pager [phone]

## 2020-07-22 NOTE — Progress Notes (Signed)
2 attempts to start PIV, [place] used once, both unsuccessful. Spoke to RN about getting a PICC line due to ordered medications and possible extravasation of infusing non-cyotoxic vesicant. Medication paused by RN. [place]

## 2020-07-22 NOTE — Evaluation (Signed)
Occupational Therapy Evaluation and Discharge Patient Details Name: Crystal Ashley MRN: [id number] DOB: 1946-04-22 Today's Date: 07/22/2020    History of Present Illness Pt is a 74 year old woman admitted on 07/21/20 with SOB with hypoxia x 2 days. + volume overload, B pleural effusions. 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   Clinical Impression   Pt lives at home with her supportive family and an aide who comes daily to assist pt with ADL. Pt participates in standing and taking steps with HHPT, but otherwise is lifted to her w/c with a hoyer lift. Pt is dependent in all ADL with exception of self feeding and grooming. Pt presents with anxiety with regard to falling. She required two person assist for mobility and use of stedy to stand from EOB. Pt + for dizziness with Sp02 noted to drop to 87% on RA, replaced 2L with pt rebounding to 100%. Pt with HR to 150, non sustained with exertion. No acute OT needs.     Follow Up Recommendations  No OT follow up    Equipment Recommendations  None recommended by OT    Recommendations for Other Services       Precautions / Restrictions Precautions Precautions: Fall Precaution Comments: watch HR and 02      Mobility Bed Mobility Overal bed mobility: Needs Assistance Bed Mobility: Rolling;Sidelying to Sit;Sit to Supine Rolling: Mod assist Sidelying to sit: +2 for physical assistance;Max assist   Sit to supine: +2 for physical assistance;Total assist        Transfers Overall transfer level: Needs assistance   Transfers: Sit to/from Stand Sit to Stand: +2 physical assistance;Mod assist;From elevated surface         General transfer comment: with use of gait belt and bed pad under hips, pt stood with increased time and encouragement    Balance Overall balance assessment: Needs assistance Sitting-balance support: Single  extremity supported Sitting balance-Leahy Scale: Poor   Postural control: Posterior lean Standing balance support: Bilateral upper extremity supported Standing balance-Leahy Scale: Poor                             ADL either performed or assessed with clinical judgement   ADL Overall ADL's : At baseline                                             Vision Patient Visual Report: No change from baseline       Perception     Praxis      Pertinent Vitals/Pain Pain Assessment: Faces Faces Pain Scale: Hurts even more Pain Location: L arm at IV site Pain Descriptors / Indicators: Grimacing;Guarding;Discomfort Pain Intervention(s): Monitored during session;Repositioned     Hand Dominance Right   Extremity/Trunk Assessment Upper Extremity Assessment Upper Extremity Assessment: Generalized weakness   Lower Extremity Assessment Lower Extremity Assessment: Defer to PT evaluation   Cervical / Trunk Assessment Cervical / Trunk Assessment: Other exceptions Cervical / Trunk Exceptions: obesity, hx of spinal sx   Communication Communication Communication: Prefers language other than English (grandson in room, pt speaks some [place], but prefers Hindi)   Cognition Arousal/Alertness: Awake/alert Behavior During Therapy: Anxious Overall Cognitive Status: History of cognitive impairments - at baseline  General Comments       Exercises     Shoulder Instructions      Home Living Family/patient expects to be discharged to:: Private residence Living Arrangements: Children;Non-relatives/Friends;Other relatives;Other (Comment) (son, son's girlfriend, grandson) Available Help at Discharge: Family;Available 24 hours/day;Personal care attendant (aide comes daily) Type of Home: House Home Access: Ramped entrance     Home Layout: Two level;Able to live on main level with bedroom/bathroom                Home Equipment: Shower seat;Cane - single point;Walker - standard;Walker - 4 wheels;Hospital bed;Wheelchair - Education officer, community - power;Other (comment) (hoyer lift)          Prior Functioning/Environment Level of Independence: Needs assistance  Gait / Transfers Assistance Needed: working on standing and taking up to 10 steps with HHPT ADL's / Homemaking Assistance Needed: self feeds and participates in grooming, otherwise dependent            OT Problem List:        OT Treatment/Interventions:      OT Goals(Current goals can be found in the care plan section) Acute Rehab OT Goals Patient Stated Goal: return home OT Goal Formulation: With patient  OT Frequency:     Barriers to D/C:            Co-evaluation PT/OT/SLP Co-Evaluation/Treatment: Yes Reason for Co-Treatment: For patient/therapist safety   OT goals addressed during session: ADL's and self-care;Strengthening/ROM      AM-PAC OT "6 Clicks" Daily Activity     Outcome Measure Help from another person eating meals?: A Little Help from another person taking care of personal grooming?: A Little Help from another person toileting, which includes using toliet, bedpan, or urinal?: Total Help from another person bathing (including washing, rinsing, drying)?: Total Help from another person to put on and taking off regular upper body clothing?: A Lot Help from another person to put on and taking off regular lower body clothing?: Total 6 Click Score: 11   End of Session Equipment Utilized During Treatment: Gait belt;Oxygen (2L) Nurse Communication: Mobility status;Other (comment) (aware IV is out from L arm)  Activity Tolerance: Patient tolerated treatment well Patient left: in bed;with call bell/phone within reach;with family/visitor present  OT Visit Diagnosis: Pain;History of falling (Z91.81);Muscle weakness (generalized) (M62.81);Unsteadiness on feet (R26.81)                Time: 0973-5329 OT Time  Calculation (min): 38 min Charges:  OT General Charges $OT Visit: 1 Visit OT Evaluation $OT Eval Moderate Complexity: 1 Mod OT Treatments $Therapeutic Activity: 8-22 mins  Nestor Lewandowsky, OTR/L Acute Rehabilitation Services Pager: [phone] Office: [phone]  Malka So 07/22/2020, 11:44 AM

## 2020-07-22 NOTE — Progress Notes (Signed)
Echocardiogram 2D Echocardiogram has been performed.  Crystal Ashley 07/22/2020, 12:44 PM

## 2020-07-22 NOTE — Progress Notes (Signed)
   07/22/20 1724  Assess: MEWS Score  Temp 98.3 F (36.8 C)  BP (!) 139/126  Pulse Rate (!) 112  ECG Heart Rate (!) 113  Resp 18  SpO2 95 %  Assess: MEWS Score  MEWS Temp 0  MEWS Systolic 0  MEWS Pulse 2  MEWS RR 0  MEWS LOC 0  MEWS Score 2  MEWS Score Color Yellow  Assess: if the MEWS score is Yellow or Red  Were vital signs taken at a resting state? Yes  Focused Assessment Change from prior assessment (see assessment flowsheet)  Early Detection of Sepsis Score *See Row Information* Low  MEWS guidelines implemented *See Row Information* No, previously yellow, continue vital signs every 4 hours  Treat  MEWS Interventions Administered scheduled meds/treatments  Pain Scale 0-10  Pain Score 0  Patient has no IV access and unable to infuse amiodarone. Orders for PICC line obtained and patient given diltizem po. Will notify provider when access available to restart amiodarone infusion. Admitted with Afib RVR and was in this rhythm prior to starting amiodarone infusion. Will continue q 4 VS. Patient asymptomatic with rhythm.

## 2020-07-23 ENCOUNTER — Inpatient Hospital Stay (HOSPITAL_COMMUNITY): Payer: Medicare Other

## 2020-07-23 LAB — CBC WITH DIFFERENTIAL/PLATELET
Abs Immature Granulocytes: 0.04 10*3/uL (ref 0.00–0.07)
Basophils Absolute: 0 10*3/uL (ref 0.0–0.1)
Basophils Relative: 0 %
Eosinophils Absolute: 1.5 10*3/uL — ABNORMAL HIGH (ref 0.0–0.5)
Eosinophils Relative: 14 %
HCT: 27.9 % — ABNORMAL LOW (ref 36.0–46.0)
Hemoglobin: 8.5 g/dL — ABNORMAL LOW (ref 12.0–15.0)
Immature Granulocytes: 0 %
Lymphocytes Relative: 17 %
Lymphs Abs: 1.8 10*3/uL (ref 0.7–4.0)
MCH: 26.6 pg (ref 26.0–34.0)
MCHC: 30.5 g/dL (ref 30.0–36.0)
MCV: 87.2 fL (ref 80.0–100.0)
Monocytes Absolute: 0.5 10*3/uL (ref 0.1–1.0)
Monocytes Relative: 4 %
Neutro Abs: 6.9 10*3/uL (ref 1.7–7.7)
Neutrophils Relative %: 65 %
Platelets: 347 10*3/uL (ref 150–400)
RBC: 3.2 MIL/uL — ABNORMAL LOW (ref 3.87–5.11)
RDW: 19.7 % — ABNORMAL HIGH (ref 11.5–15.5)
WBC: 10.7 10*3/uL — ABNORMAL HIGH (ref 4.0–10.5)
nRBC: 0 % (ref 0.0–0.2)

## 2020-07-23 LAB — COMPREHENSIVE METABOLIC PANEL
ALT: 13 U/L (ref 0–44)
AST: 16 U/L (ref 15–41)
Albumin: 2.8 g/dL — ABNORMAL LOW (ref 3.5–5.0)
Alkaline Phosphatase: 69 U/L (ref 38–126)
Anion gap: 12 (ref 5–15)
BUN: 12 mg/dL (ref 8–23)
CO2: 33 mmol/L — ABNORMAL HIGH (ref 22–32)
Calcium: 8.6 mg/dL — ABNORMAL LOW (ref 8.9–10.3)
Chloride: 96 mmol/L — ABNORMAL LOW (ref 98–111)
Creatinine, Ser: 0.8 mg/dL (ref 0.44–1.00)
GFR, Estimated: >60 mL/min (ref >60)
Glucose, Bld: 162 mg/dL — ABNORMAL HIGH (ref 70–99)
Potassium: 2.9 mmol/L — ABNORMAL LOW (ref 3.5–5.1)
Sodium: 141 mmol/L (ref 135–145)
Total Bilirubin: 0.7 mg/dL (ref 0.3–1.2)
Total Protein: 6.3 g/dL — ABNORMAL LOW (ref 6.5–8.1)

## 2020-07-23 LAB — TYPE AND SCREEN
ABO/RH(D): O POS
Antibody Screen: NEGATIVE
Unit division: 0

## 2020-07-23 LAB — PHOSPHORUS: Phosphorus: 3.3 mg/dL (ref 2.5–4.6)

## 2020-07-23 LAB — GLUCOSE, CAPILLARY
Glucose-Capillary: 126 mg/dL — ABNORMAL HIGH (ref 70–99)
Glucose-Capillary: 208 mg/dL — ABNORMAL HIGH (ref 70–99)
Glucose-Capillary: 180 mg/dL — ABNORMAL HIGH (ref 70–99)
Glucose-Capillary: 119 mg/dL — ABNORMAL HIGH (ref 70–99)

## 2020-07-23 LAB — PROCALCITONIN: Procalcitonin: 0.35 ng/mL

## 2020-07-23 LAB — BPAM RBC
Blood Product Expiration Date: 202112302359
ISSUE DATE / TIME: 202112062354
Unit Type and Rh: 5100

## 2020-07-23 LAB — MAGNESIUM: Magnesium: 1.8 mg/dL (ref 1.7–2.4)

## 2020-07-23 LAB — HAPTOGLOBIN: Haptoglobin: 259 mg/dL (ref 42–346)

## 2020-07-23 IMAGING — DX DG CHEST 2V
2 series · 2 of 2 positions shown · non-contrast
Comparison: [DATE]

CLINICAL DATA: Pleural effusion

EXAM:
CHEST - 2 VIEW

[chest lat]
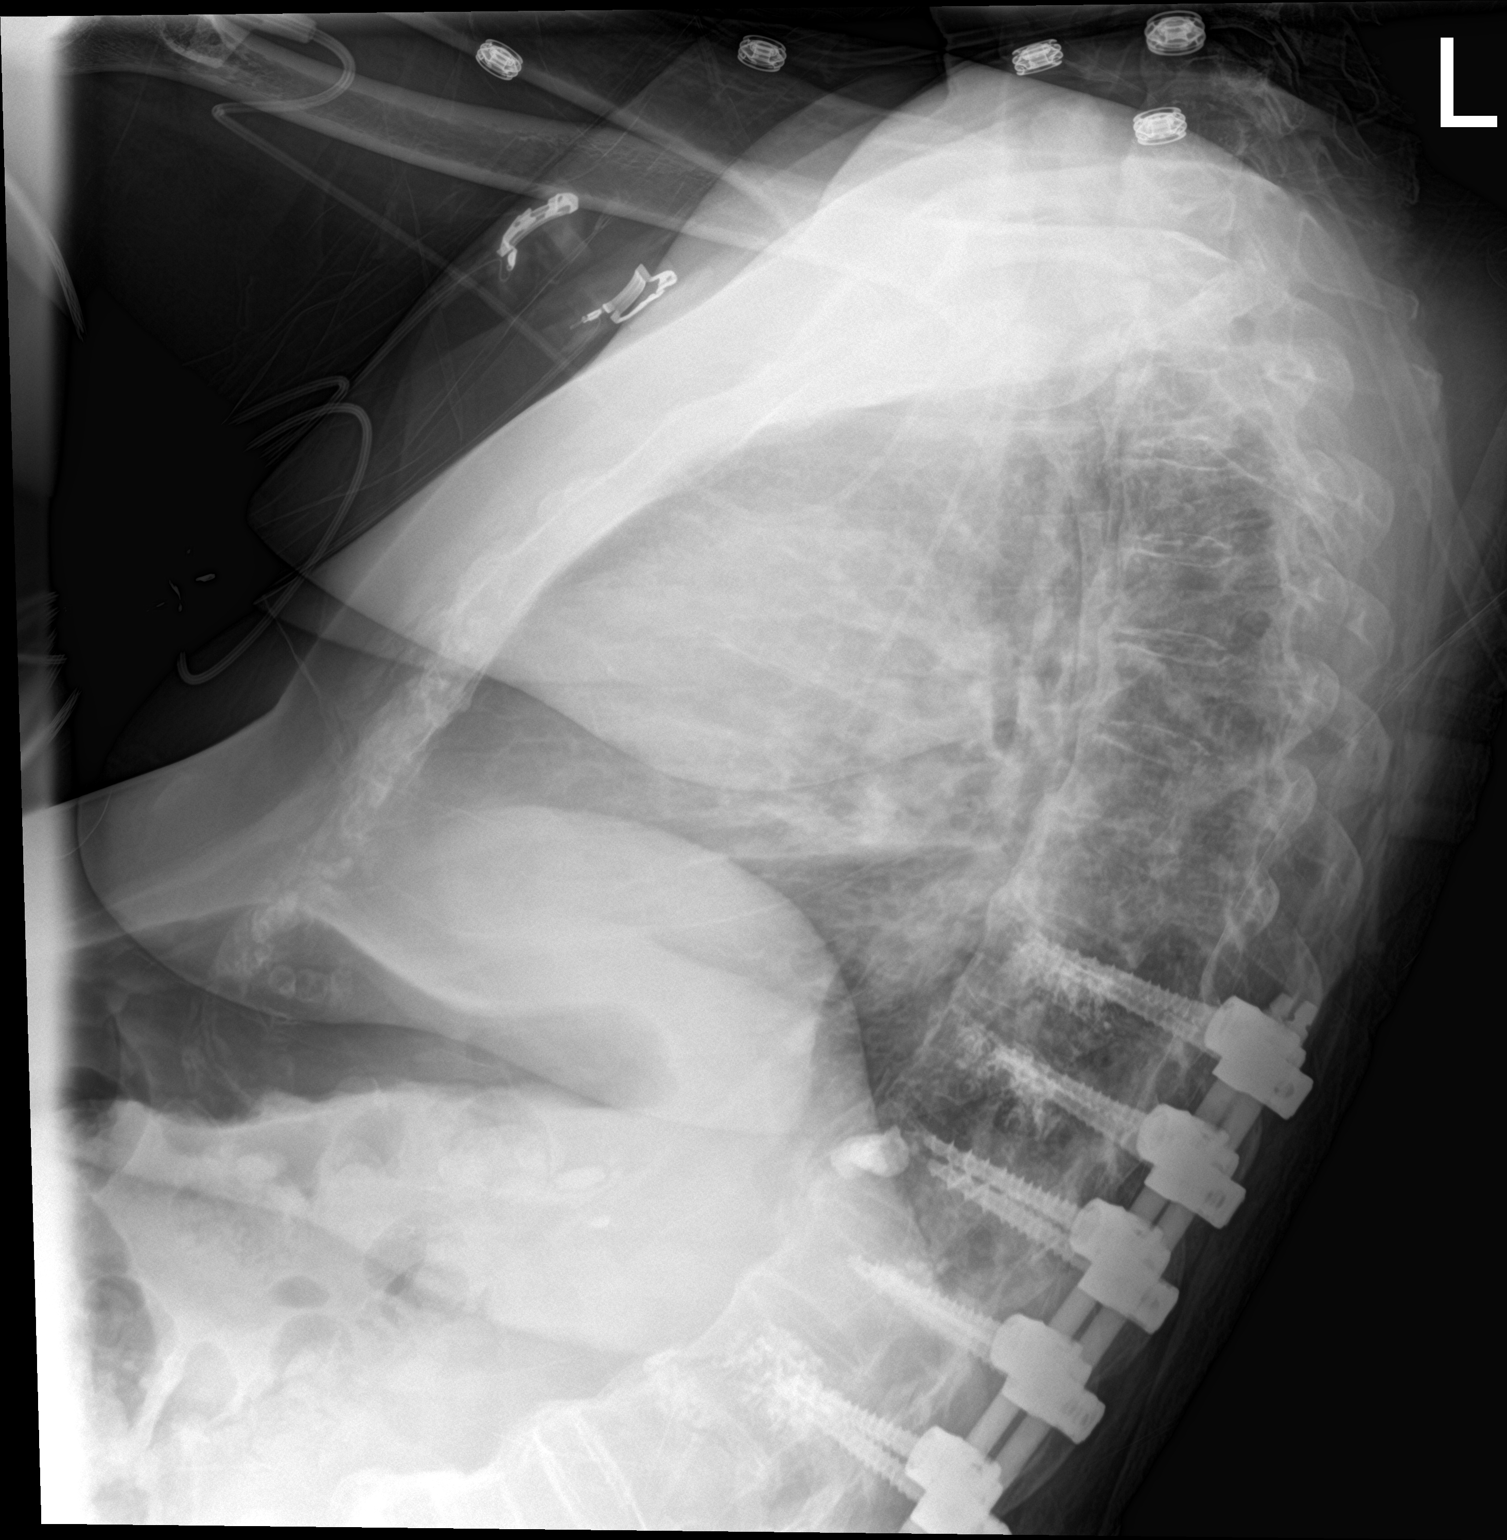

[chest ap]
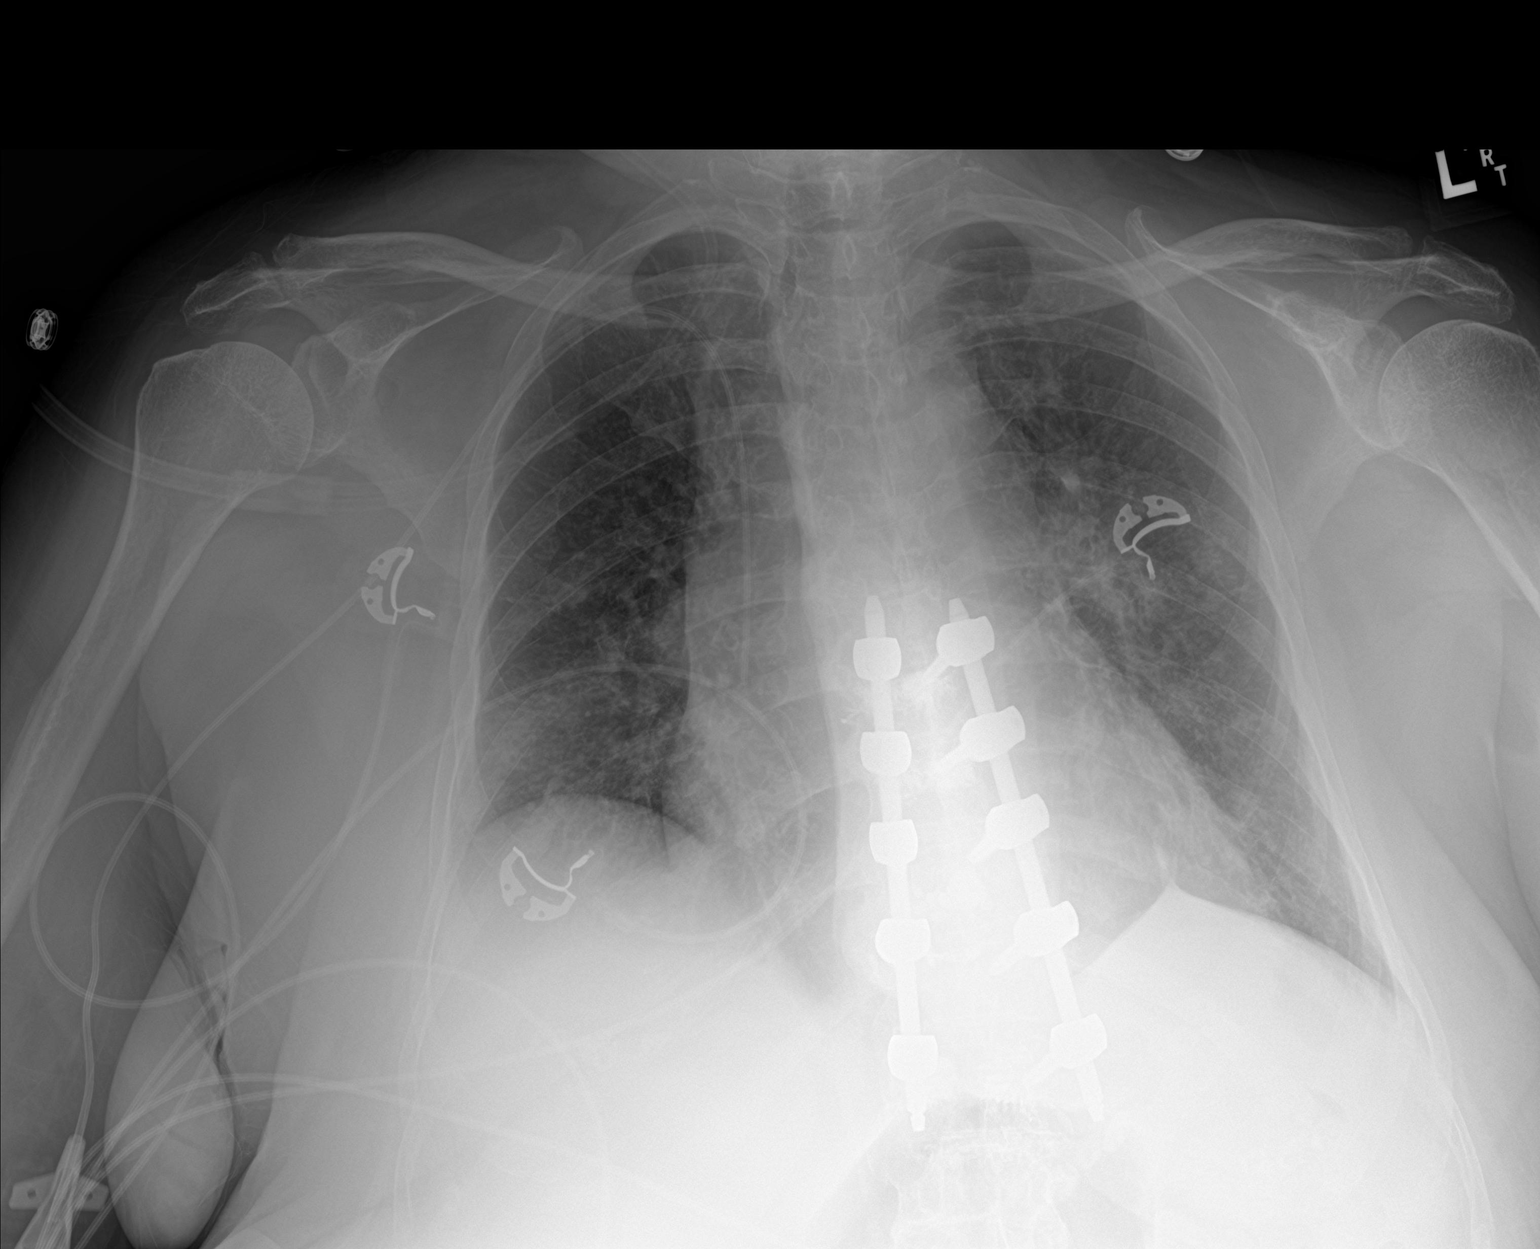

[2 of 2 positions shown; findings below may reference images not displayed]

FINDINGS: No visible pleural effusion or pneumothorax. Patchy opacity in the
left perihilar region. Right lung clear. Heart is mildly enlarged.
Right PICC line is in place with the tip at the cavoatrial junction.
IMPRESSION: Mild cardiomegaly.

Patchy opacity in the left perihilar region concerning for
infiltrate/pneumonia.

No effusion visualized.

## 2020-07-23 MED ORDER — AMIODARONE HCL 200 MG PO TABS
200.0000 mg | ORAL_TABLET | Freq: Two times a day (BID) | ORAL | Status: DC
  Administered 2020-07-23 – 2020-07-26 (×8): 200 mg via ORAL
  Filled 2020-07-23 (×8): qty 1

## 2020-07-23 MED ORDER — POTASSIUM CHLORIDE CRYS ER 10 MEQ PO TBCR
40.0000 meq | EXTENDED_RELEASE_TABLET | Freq: Two times a day (BID) | ORAL | Status: DC
  Administered 2020-07-23 – 2020-07-24 (×3): 40 meq via ORAL
  Filled 2020-07-23 (×3): qty 4

## 2020-07-23 MED ORDER — METOPROLOL TARTRATE 12.5 MG HALF TABLET
12.5000 mg | ORAL_TABLET | Freq: Two times a day (BID) | ORAL | Status: DC
  Administered 2020-07-23 – 2020-07-26 (×8): 12.5 mg via ORAL
  Filled 2020-07-23 (×8): qty 1

## 2020-07-23 MED ORDER — FUROSEMIDE 10 MG/ML IJ SOLN
40.0000 mg | Freq: Two times a day (BID) | INTRAMUSCULAR | Status: AC
  Administered 2020-07-23 – 2020-07-25 (×4): 40 mg via INTRAVENOUS
  Filled 2020-07-23 (×4): qty 4

## 2020-07-23 MED ORDER — POTASSIUM CHLORIDE CRYS ER 20 MEQ PO TBCR
40.0000 meq | EXTENDED_RELEASE_TABLET | Freq: Two times a day (BID) | ORAL | Status: DC
  Administered 2020-07-23: 40 meq via ORAL
  Filled 2020-07-23: qty 2

## 2020-07-23 MED ORDER — SPIRONOLACTONE 25 MG PO TABS
25.0000 mg | ORAL_TABLET | Freq: Every day | ORAL | Status: DC
  Administered 2020-07-23 – 2020-07-26 (×4): 25 mg via ORAL
  Filled 2020-07-23 (×4): qty 1

## 2020-07-23 NOTE — Plan of Care (Signed)
  Problem: Coping: Goal: Level of anxiety will decrease Outcome: Progressing   Problem: Elimination: Goal: Will not experience complications related to urinary retention Outcome: Progressing   

## 2020-07-23 NOTE — Plan of Care (Signed)
  Problem: Coping: Goal: Level of anxiety will decrease Outcome: Progressing   Problem: Elimination: Goal: Will not experience complications related to urinary retention Outcome: Progressing   Problem: Pain Managment: Goal: General experience of comfort will improve Outcome: Progressing   

## 2020-07-23 NOTE — Consult Note (Signed)
CARDIOLOGY CONSULT NOTE  Patient ID: Crystal Ashley MRN: [id number] DOB/AGE: [birth year] 74 y.o.  Admit date: 07/20/2020 Referring Physician  Verneita Griffes, MD Primary Physician:  [name], [place] Reason for Consultation  CHF AND A. FIB  Patient ID: Crystal Ashley, female    DOB: 1945-12-26, 74 y.o.   MRN: [id number]  Chief Complaint  Patient presents with  . Shortness of Breath   HOPI:  Crystal Ashley is a 74 y.o. Asian [place] female with type 2 diabetes, hyperlipidemia, hypertension, hypothyroidism, history of endometrial cancer in 2015, permanent atrial fibrillation on Xarelto history of uterine cancer status post hysterectomy, with poor functional status since pelvic fracture in June 2021 was discharged to rehab, has had inpatient and outpatient rehab, also underwent fusion of T10 and T11 fractures after the fall on 02/26/2020.  She has been bedridden/wheelchair bound since hospital discharge.  Does very minimal activity.  Over the last 4 to 5 days son has noticed that she is getting increasingly short of breath and unable to lay down in bed.  EMS was activated, she was found to be severely hypoxemic and hence brought to the emergency room where she was found to be in acute decompensated diastolic heart failure with bilateral pleural effusion and congestive lungs on chest x-ray.  She was admitted for further evaluation and management.  I was consulted for evaluation and management of heart failure and also A. fib with RVR.  She was also found to be severely anemic, baseline hemoglobin had decreased from around 9+ to around 8+ and hence received 1 unit of packed RBCs on 07/22/2020.  No obvious dark stools per patient, no bloody stool.  She has been tolerating anticoagulation.  Denies any recent fall. Presently states that she is doing okay but still short of breath.  Denies fever or chills.  She states that she has not been able to do much, essentially stays in bed all day or sits on a  chair.    Past Medical History:  Diagnosis Date  . Atrial fibrillation ([place])   . Cancer ([place])   . Cataract   . Diabetes mellitus without complication ([place])   . Fracture 2001   left ankle  . Glaucoma   . Hypertension   . Thyroid disease    Past Surgical History:  Procedure Laterality Date  . APPLICATION OF ROBOTIC ASSISTANCE FOR SPINAL PROCEDURE N/A 02/26/2020   Procedure: APPLICATION OF ROBOTIC ASSISTANCE FOR SPINAL PROCEDURE;  Surgeon: [name], MD;  Location: [facility];  Service: Neurosurgery;  Laterality: N/A;  . CARDIOVERSION N/A 04/08/2018   Procedure: CARDIOVERSION;  Surgeon: Adrian Prows, MD;  Location: [facility] ENDOSCOPY;  Service: Cardiovascular;  Laterality: N/A;  . CARDIOVERSION N/A 05/22/2019   Procedure: CARDIOVERSION;  Surgeon: Adrian Prows, MD;  Location: [facility];  Service: Cardiovascular;  Laterality: N/A;  . FOOT SURGERY Left   . HYSTEROSCOPY     POLYPECTOMY  . LUMBAR PERCUTANEOUS PEDICLE SCREW 4 LEVEL N/A 02/26/2020   Procedure: Thoracic eight to thoracic twelve posterior percutaneous instrumentation with cement augmentation and bilateral medial facetectomy for decompression at Thoracic ten-eleven;  Surgeon: [name], MD;  Location: [facility];  Service: Neurosurgery;  Laterality: N/A;   Social History   Tobacco Use  . Smoking status: Never Smoker  . Smokeless tobacco: Never Used  Substance Use Topics  . Alcohol use: No    Family History  Problem Relation Age of Onset  . Diabetes Son   . Healthy Mother   .  Healthy Father     Marital Sttus: Widowed  ROS  Review of Systems  Cardiovascular: Positive for dyspnea on exertion and orthopnea. Negative for chest pain and leg swelling.  Respiratory: Negative for cough.   Musculoskeletal: Positive for back pain and muscle weakness.  Gastrointestinal: Negative for melena.  All other systems reviewed and are negative.  Objective   Vitals with BMI 07/23/2020 07/23/2020 07/23/2020  Height - - -  Weight - 186  lbs -  BMI - 65.68 -  Systolic 127 - 517  Diastolic 65 - 62  Pulse 82 - 95    Blood pressure 132/65, pulse 82, temperature 97.9 F (36.6 C), temperature source Oral, resp. rate 18, height 5' (1.524 m), weight 84.4 kg, SpO2 100 %.    Physical Exam Constitutional:      General: She is in acute distress.     Appearance: She is obese.  HENT:     Head: Atraumatic.  Eyes:     Conjunctiva/sclera: Conjunctivae normal.  Neck:     Thyroid: No thyromegaly.  Cardiovascular:     Rate and Rhythm: Rhythm irregular.     Pulses:          Carotid pulses are 2+ on the right side and 2+ on the left side.      Dorsalis pedis pulses are 2+ on the right side and 2+ on the left side.       Posterior tibial pulses are 2+ on the right side and 2+ on the left side.     Heart sounds: Normal heart sounds. No murmur heard.  No gallop. No S3 sounds.      Comments: Femoral and popliteal pulse difficult to feel due to patient's body habitus.  JVD elvated up to jaw. No pedal edema Pulmonary:     Effort: Pulmonary effort is normal.     Breath sounds: Rales (bilateral bases, left > right) present.  Abdominal:     General: Bowel sounds are normal.     Palpations: Abdomen is soft.     Comments: obese  Musculoskeletal:        General: Normal range of motion.     Cervical back: Neck supple.  Skin:    General: Skin is warm and dry.  Neurological:     General: No focal deficit present.     Mental Status: She is alert and oriented to person, place, and time.  Psychiatric:        Attention and Perception: Attention and perception normal.        Mood and Affect: Mood is depressed. Affect is flat.    Laboratory examination:   Recent Labs    02/19/20 0659 02/19/20 0659 02/25/20 0830 02/26/20 0839 02/28/20 1508 05/22/20 1128 07/21/20 1600 07/22/20 0439 07/23/20 0640  NA 141  --   --    < > 139   < > 139 140 141  K 3.8  --   --    < > 3.7   < > 3.5 3.5 2.9*  CL 103   < >  --   --  102   < > 97* 98 96*   CO2 28   < >  --   --  27   < > 31 33* 33*  GLUCOSE 146*   < >  --   --  275*   < > 166* 162* 162*  BUN 14   < >  --   --  15   < >  11 10 12   CREATININE 0.93   < > 0.85  --  0.81   < > 0.89 0.85 0.80  CALCIUM 8.5*   < >  --   --  8.2*   < > 8.6* 8.9 8.6*  GFRNONAA >60   < > >60  --  >60   < > >60 >60 >60  GFRAA >60  --  >60  --  >60  --   --   --   --    < > = values in this interval not displayed.   estimated creatinine clearance is 59.5 mL/min (by C-G formula based on SCr of 0.8 mg/dL).  CMP Latest Ref Rng & Units 07/23/2020 07/22/2020 07/21/2020  Glucose 70 - 99 mg/dL 162(H) 162(H) 166(H)  BUN 8 - 23 mg/dL 12 10 11   Creatinine 0.44 - 1.00 mg/dL 0.80 0.85 0.89  Sodium 135 - 145 mmol/L 141 140 139  Potassium 3.5 - 5.1 mmol/L 2.9(L) 3.5 3.5  Chloride 98 - 111 mmol/L 96(L) 98 97(L)  CO2 22 - 32 mmol/L 33(H) 33(H) 31  Calcium 8.9 - 10.3 mg/dL 8.6(L) 8.9 8.6(L)  Total Protein 6.5 - 8.1 g/dL 6.3(L) 6.1(L) -  Total Bilirubin 0.3 - 1.2 mg/dL 0.7 1.0 -  Alkaline Phos 38 - 126 U/L 69 55 -  AST 15 - 41 U/L 16 12(L) -  ALT 0 - 44 U/L 13 10 -   CBC Latest Ref Rng & Units 07/23/2020 07/22/2020 07/21/2020  WBC 4.0 - 10.5 K/uL 10.7(H) 10.5 11.1(H)  Hemoglobin 12.0 - 15.0 g/dL 8.5(L) 7.1(L) 7.9(L)  Hematocrit 36 - 46 % 27.9(L) 24.0(L) 28.1(L)  Platelets 150 - 400 K/uL 347 325 416(H)   Lipid Panel Recent Labs    11/29/19 0944 07/22/20 0439  CHOL 129 98  TRIG 142.0 87  LDLCALC 63 51  VLDL 28.4 17  HDL 38.00* 30*  CHOLHDL 3 3.3    HEMOGLOBIN A1C Lab Results  Component Value Date   HGBA1C 6.0 (H) 07/22/2020   MPG 125.5 07/22/2020   TSH Recent Labs    05/26/20 1142 07/21/20 0812 07/22/20 0439  TSH 5.001* 2.412 2.717   BNP (last 3 results) Recent Labs    05/22/20 1128 05/26/20 1142 07/21/20 0125  BNP 270.8* 563.2* 115.0*    Ref Range & Units 2 d ago  (07/21/20) 2 d ago  (07/21/20) 1 mo ago  (05/26/20) 1 mo ago  (05/26/20) 2 mo ago  (05/22/20)  Troponin I (High Sensitivity)  <18 ng/L 6  6 CM  40High CM  41High CM        Medications and allergies   Allergies  Allergen Reactions  . 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     Current Meds  Medication Sig  . atorvastatin (LIPITOR) 10 MG tablet Take 1 tablet (10 mg total) by mouth daily.  . Calcium-Phosphorus-Vitamin D (CITRACAL +D3 PO) Take 1 tablet by mouth daily.  Marland Kitchen diltiazem (CARDIZEM CD) 240 MG 24 hr capsule Take 1 capsule (240 mg total) by mouth daily.  . dorzolamide-timolol (COSOPT) 22.3-6.8 MG/ML ophthalmic solution Place 1 drop into both eyes 2 (two) times daily.   . furosemide (LASIX) 40 MG tablet Take 1 tablet (40 mg total) by mouth daily.  Marland Kitchen levothyroxine (SYNTHROID) 75 MCG tablet Take 1 tablet (75 mcg total) by mouth daily before breakfast.  . Menthol, Topical Analgesic, (BIOFREEZE) 4 % GEL Apply 1 application topically daily  as needed (pain). Apply to right side rib cage and lateral thigh  . metFORMIN (GLUCOPHAGE) 1000 MG tablet Take 1 tablet (1,000 mg total) by mouth daily with breakfast.  . pantoprazole (PROTONIX) 40 MG tablet Take 1 tablet (40 mg total) by mouth daily.  . potassium chloride (MICRO-K) 10 MEQ CR capsule Take 10 mEq by mouth daily.  . rivaroxaban (XARELTO) 20 MG TABS tablet Take 1 tablet (20 mg total) by mouth daily with supper. (Patient taking differently: Take 20 mg by mouth daily. )  . sennosides (SENOKOT) 8.8 MG/5ML syrup Take 10 mLs by mouth daily as needed for mild constipation.   . travoprost, benzalkonium, (TRAVATAN) 0.004 % ophthalmic solution Place 1 drop into both eyes at bedtime.   . vitamin B-12 (CYANOCOBALAMIN) 500 MCG tablet Take 500 mcg by mouth daily.     Scheduled Meds: . albuterol  5 mg Nebulization Once  . atorvastatin  10 mg Oral Daily  . Chlorhexidine Gluconate Cloth  6 each Topical Daily  . diltiazem  120 mg Oral Daily  . insulin aspart  0-5 Units Subcutaneous QHS   . insulin aspart  0-9 Units Subcutaneous TID WC  . levothyroxine  75 mcg Oral QAC breakfast  . pantoprazole  40 mg Oral BID  . potassium chloride  40 mEq Oral BID  . rivaroxaban  20 mg Oral Q supper  . sodium chloride flush  10-40 mL Intracatheter Q12H  . Travoprost (BAK Free)  1 drop Both Eyes QHS  . vitamin B-12  500 mcg Oral Daily   Continuous Infusions: . amiodarone 30 mg/hr (07/23/20 0657)   PRN Meds:.sodium chloride flush   I/O last 3 completed shifts: In: 1059 [P.O.:120; I.V.:624; Blood:315] Out: 1800 [Urine:1800] Total I/O In: 380 [P.O.:360; I.V.:20] Out: -     Radiology:    DG CHEST PORT 1 VIEW  Result Date: 07/22/2020 CLINICAL DATA:  Status post PICC line placement. EXAM: PORTABLE CHEST 1 VIEW COMPARISON:  July 21, 2020 FINDINGS: A right-sided PICC line is seen with its distal tip noted just beyond the junction of the superior vena cava and right atrium. Mild diffuse hazy areas of atelectasis and/or infiltrate are seen throughout both lungs. There is no evidence of a pleural effusion or pneumothorax. The cardiac silhouette is moderately enlarged. Bilateral pedicle screws are seen within the lower thoracic spine. IMPRESSION: Right-sided PICC line with its distal tip noted just beyond the junction of the superior vena cava and right atrium.  Cardiac Studies:   Lexiscan myoview stress test 04/01/2018: 1. Lexiscan stress test was performed. Exercise capacity was not assessed. No stress symptoms reported. Normal blood pressure. The resting and stress electrocardiogram demonstrated atrial fibrillation with rapid ventricular rate, low voltage, and normal rest repolarization. Stress EKG is non diagnostic for ischemia as it is a pharmacologic stress. 2. The overall quality of the study is fair. Review of the raw data in a rotational cine format reveals breast attenuation, with imaging performed in sitting position. Gated SPECT images reveal normal myocardial thickening and  wall motion. The left ventricular ejection fraction was calculated or visually estimated to be 47%. REST and STRESS images demonstrate decreased tracer uptake in the mid inferoseptal, mid inferior, apical septal and apical inferior segments of the left ventricle, worse on rest images. While defect likely represents breast attenuation, ischemia in this region cannot be excluded. Clinical correlation recommended. 3. Low to intermediate risk study.  Echocardiogram 07/22/2020 Comparison: 10/19/2018 :   1. Left ventricular ejection fraction, by estimation, is  60 to 65%. The left ventricle has normal function. Left ventricular endocardial border not optimally defined to evaluate regional wall motion. Left ventricular diastolic function could not be evaluated.   2. Right ventricular systolic function is normal. The right ventricular size is normal. There is mildly elevated pulmonary artery systolic pressure. The estimated right ventricular systolic pressure is 93.7 mmHg.   3. The mitral valve is grossly normal. No evidence of mitral valve regurgitation. No evidence of mitral stenosis.   4. The aortic valve is grossly normal. Aortic valve regurgitation is not visualized. No aortic stenosis is present.   5. The inferior vena cava is dilated in size with <50% respiratory variability, suggesting right atrial pressure of 15 mmHg. Comparison(s): No significant change from prior study.   EKG:  EKG 07/22/2020: Atrial fibrillation with rapid ventricular response at rate of 104 bpm, normal axis, low-voltage complexes.  Normal QT interval, no evidence of ischemia.  Assessment   1.  Acute on chronic diastolic heart failure 2.  Atrial fibrillation with rapid ventricular response, permanent atrial fibrillation. 3.  Anemia of chronic disease, B12, folate and transferrin levels and ferritin levels normal.  Received 1 unit of packed RBCs, no obvious signs of bleed, stool Hemoccult negative. 4.  Failure to thrive, in an  adult, decub ulcers, appears withdrawn and depressed. Recommendations:   I would add metoprolol tartrate 12.5 mg twice daily, previously has had low blood pressure with beta-blocker therapy along with calcium channel blocker therapy combination.  Transition to oral amiodarone 200 mg p.o. twice daily.  Transition to IV furosemide for diuresis.  I have reordered furosemide 40 mg instead of 20 mg every 12, also added Aldactone 25 mg daily.  I have ordered BMP daily basis, x3.  Follow-up on serum creatinine.  May need work-up on anemia further.  With regard to anticoagulation, tolerating Xarelto.  No obvious signs of bleeding.  Continue the same in view of cardioembolic risk.   Adrian Prows, MD, [facility] 07/23/2020, 9:45 AM Office: [phone]

## 2020-07-23 NOTE — Progress Notes (Signed)
PROGRESS NOTE   Crystal Ashley  PNT:[id number] DOB: [birth year] DOA: 07/20/2020 PCP: [name], PA  Brief Narrative:  74 year old [place] resident A. fib CHADS2 score >4 DM TY 2, stage I uterine cancer status post hysterectomy, prior admissions for pneumonia, pelvic fracture 05/22/2020 mild cognitive issues Last admitted 10/10 through 05/29/2020 with acute respiratory failure secondary to volume overload  Readmit with S OB orthopnea hypoxia-O2 sat 80% improved on 2 L-CXR = pulmonary edema   Assessment & Plan:   Active Problems:   Atrial fibrillation with RVR (HCC)   Acute respiratory failure with hypoxia (HCC)   Bilateral pleural effusion   Pulmonary edema   Acute on chronic diastolic CHF (congestive heart failure) (HCC)   Chronic blood loss anemia   Controlled type 2 diabetes mellitus with hyperglycemia (HCC)   Hypothyroidism, adult   Physical deconditioning   1. Hypoxic respiratory failure secondary to pulmonary edema and acute decompensated HFpEF a. Cardiologist Dr. Einar Gip consulted b. Diuretics changed to Lasix 40 IV twice daily but only net 200 - today c. Aldactone 25 added in addition 12/7 d. Monitor trends 2. Bilateral pleural effusions a. CXR left perihilar region?  Pneumonia b. No fever no chills only observe at this time c. Procalcitonin ordered for a.m. if not elevated would disregard this is being pneumonia if elevated consider starting antibiotics 3. Blood loss anemia a. Etiology unclear-probably from chronic disease as hemoglobin trends between 7 and 8 b. Get iron studies c. Transfused 1 unit of blood 12/6 with acceptable rise 4. Permanent A. fib on Xarelto in RVR a. Amiodarone converted by cardiology 07/7199 3 times daily b. Continue Cardizem 120-also added 12/7 metoprolol 12.5 twice daily c. Given no dark or tarry stool would continue Xarelto at this time 5. Hypothyroid a. Get TSH in the next 2 weeks continue Synthroid 75 mcg 6. Bedbound  state a. Will return to skilled?  Therapy recommending home health 7. DM TY 2 A1c 6.1 a. CBGs ranging 126-208 b. Metformin 1000 twice a.m. from prior to admission on hold c. Continue only sliding scale at this time 8. Reflux a. Continue Protonix 40 daily 9. Probable leukocytosis from reactive process  DVT prophylaxis: Xarelto Code Status: DNR Family Communication:  Disposition:  Status is: Inpatient  Remains inpatient appropriate because:Hemodynamically unstable, Ongoing active pain requiring inpatient pain management and Altered mental status   Dispo:  Patient From: Home  Planned Disposition: Home with Health Care Svc  Expected discharge date: 07/26/20  Medically stable for discharge: No        Consultants:   None  Procedures: No  Antimicrobials: None   Subjective:  Awake coherent no distress on oxygen nursing reports that when at rest can use 2 L but using as high as 4 L with activity No chest pain no fever Passing urine  Objective: Vitals:   07/23/20 0313 07/23/20 0500 07/23/20 0728 07/23/20 1129  BP: 134/62  132/65 118/61  Pulse: 95  82 96  Resp: 16  18 18   Temp: 97.7 F (36.5 C)  97.9 F (36.6 C) 98.1 F (36.7 C)  TempSrc: Oral  Oral Oral  SpO2: 100%  100% 98%  Weight:  84.4 kg    Height:        Intake/Output Summary (Last 24 hours) at 07/23/2020 1405 Last data filed at 07/23/2020 0900 Gross per 24 hour  Intake 1439.01 ml  Output 500 ml  Net 939.01 ml   Filed Weights   07/21/20 0007 07/23/20 0500  Weight: 84.6  kg 84.4 kg    Examination:  eomi ncat s1 s2 no m r gallop in Afib chest has some rale sand rhonchi abd soft nt nd no rebound Le not swelling Neuro intact moving all 4 limbs equally   Data Reviewed: I have personally reviewed following labs and imaging studies Sodium 148-->141 Potassium 3.5-->2.9 Magnesium 1.8 LFTs normal WBC 10.7, hemoglobin 8.5, platelet 347  Radiology Studies: DG Chest 2 View  Result Date:  07/23/2020 CLINICAL DATA:  Pleural effusion EXAM: CHEST - 2 VIEW COMPARISON:  07/22/2020 FINDINGS: No visible pleural effusion or pneumothorax. Patchy opacity in the left perihilar region. Right lung clear. Heart is mildly enlarged. Right PICC line is in place with the tip at the cavoatrial junction. IMPRESSION: Mild cardiomegaly. Patchy opacity in the left perihilar region concerning for infiltrate/pneumonia. No effusion visualized. Electronically Signed   By: Rolm Baptise M.D.   On: 07/23/2020 12:27   DG CHEST PORT 1 VIEW  Result Date: 07/22/2020 CLINICAL DATA:  Status post PICC line placement. EXAM: PORTABLE CHEST 1 VIEW COMPARISON:  July 21, 2020 FINDINGS: A right-sided PICC line is seen with its distal tip noted just beyond the junction of the superior vena cava and right atrium. Mild diffuse hazy areas of atelectasis and/or infiltrate are seen throughout both lungs. There is no evidence of a pleural effusion or pneumothorax. The cardiac silhouette is moderately enlarged. Bilateral pedicle screws are seen within the lower thoracic spine. IMPRESSION: Right-sided PICC line with its distal tip noted just beyond the junction of the superior vena cava and right atrium. Electronically Signed   By: Virgina Norfolk M.D.   On: 07/22/2020 19:33   ECHOCARDIOGRAM COMPLETE  Result Date: 07/22/2020    ECHOCARDIOGRAM REPORT   Patient Name:   Crystal Ashley Date of Exam: 07/22/2020 Medical Rec #:  [id number]          Height:       60.0 in Accession #:    [id number]         Weight:       186.5 lb Date of Birth:  Sep 26, 1945           BSA:          1.812 m Patient Age:    24 years           BP:           147/93 mmHg Patient Gender: F                  HR:           117 bpm. Exam Location:  Inpatient Procedure: 2D Echo, Color Doppler and Cardiac Doppler Indications:    J67.34 Acute diastolic (congestive) heart failure  History:        Patient has prior history of Echocardiogram examinations, most                  recent 02/28/2020. CHF; Risk Factors:Hypertension, Diabetes and                 Dyslipidemia.  Sonographer:    Raquel Sarna Senior RDCS Referring Phys: [id number] [place]  Sonographer Comments: Technically difficult study due to poor echo windows. Technically difficult due to positioning, scanned upright due to dyspnea. IMPRESSIONS  1. Left ventricular ejection fraction, by estimation, is 60 to 65%. The left ventricle has normal function. Left ventricular endocardial border not optimally defined to evaluate regional wall motion. Left ventricular diastolic function could not be evaluated.  2. Right ventricular systolic function is normal. The right ventricular size is normal. There is mildly elevated pulmonary artery systolic pressure. The estimated right ventricular systolic pressure is 56.2 mmHg.  3. The mitral valve is grossly normal. No evidence of mitral valve regurgitation. No evidence of mitral stenosis.  4. The aortic valve is grossly normal. Aortic valve regurgitation is not visualized. No aortic stenosis is present.  5. The inferior vena cava is dilated in size with <50% respiratory variability, suggesting right atrial pressure of 15 mmHg. Comparison(s): No significant change from prior study. FINDINGS  Left Ventricle: Left ventricular ejection fraction, by estimation, is 60 to 65%. The left ventricle has normal function. Left ventricular endocardial border not optimally defined to evaluate regional wall motion. The left ventricular internal cavity size was normal in size. There is no left ventricular hypertrophy. Left ventricular diastolic function could not be evaluated due to atrial fibrillation. Left ventricular diastolic function could not be evaluated. Right Ventricle: The right ventricular size is normal. No increase in right ventricular wall thickness. Right ventricular systolic function is normal. There is mildly elevated pulmonary artery systolic pressure. The tricuspid regurgitant velocity is 2.78   m/s, and with an assumed right atrial pressure of 15 mmHg, the estimated right ventricular systolic pressure is 56.3 mmHg. Left Atrium: Left atrial size was normal in size. Right Atrium: Right atrial size was normal in size. Pericardium: There is no evidence of pericardial effusion. Presence of pericardial fat pad. Mitral Valve: The mitral valve is grossly normal. No evidence of mitral valve regurgitation. No evidence of mitral valve stenosis. Tricuspid Valve: The tricuspid valve is grossly normal. Tricuspid valve regurgitation is trivial. No evidence of tricuspid stenosis. Aortic Valve: The aortic valve is grossly normal. Aortic valve regurgitation is not visualized. No aortic stenosis is present. Pulmonic Valve: The pulmonic valve was grossly normal. Pulmonic valve regurgitation is trivial. No evidence of pulmonic stenosis. Aorta: The aortic root and ascending aorta are structurally normal, with no evidence of dilitation. Venous: The inferior vena cava is dilated in size with less than 50% respiratory variability, suggesting right atrial pressure of 15 mmHg. IAS/Shunts: The atrial septum is grossly normal.  LEFT VENTRICLE PLAX 2D LVIDd:         4.40 cm LVIDs:         2.60 cm LV PW:         1.40 cm LV IVS:        1.00 cm LVOT diam:     1.80 cm LV SV:         55 LV SV Index:   31 LVOT Area:     2.54 cm  RIGHT VENTRICLE RV S prime:     6.09 cm/s LEFT ATRIUM           Index       RIGHT ATRIUM           Index LA diam:      3.30 cm 1.82 cm/m  RA Area:     16.90 cm LA Vol (A4C): 47.7 ml 26.32 ml/m RA Volume:   40.20 ml  22.19 ml/m  AORTIC VALVE LVOT Vmax:   133.00 cm/s LVOT Vmean:  85.300 cm/s LVOT VTI:    0.218 m  AORTA Ao Root diam: 3.20 cm Ao Asc diam:  3.50 cm TRICUSPID VALVE TR Peak grad:   30.9 mmHg TR Vmax:        278.00 cm/s  SHUNTS Systemic VTI:  0.22 m Systemic Diam: 1.80 cm [place]  O'Neal MD Electronically signed by Eleonore Chiquito MD Signature Date/Time: 07/22/2020/2:49:09 PM    Final    [place] EKG SITE  RITE  Result Date: 07/22/2020 If Site Rite image not attached, placement could not be confirmed due to current cardiac rhythm.    Scheduled Meds: . albuterol  5 mg Nebulization Once  . amiodarone  200 mg Oral BID  . atorvastatin  10 mg Oral Daily  . Chlorhexidine Gluconate Cloth  6 each Topical Daily  . diltiazem  120 mg Oral Daily  . furosemide  40 mg Intravenous Q12H  . insulin aspart  0-5 Units Subcutaneous QHS  . insulin aspart  0-9 Units Subcutaneous TID WC  . levothyroxine  75 mcg Oral QAC breakfast  . metoprolol tartrate  12.5 mg Oral BID  . pantoprazole  40 mg Oral BID  . potassium chloride  40 mEq Oral BID  . rivaroxaban  20 mg Oral Q supper  . sodium chloride flush  10-40 mL Intracatheter Q12H  . spironolactone  25 mg Oral Daily  . Travoprost (BAK Free)  1 drop Both Eyes QHS  . vitamin B-12  500 mcg Oral Daily   Continuous Infusions:    LOS: 2 days    Time spent: O[place], MD [facility] To contact the attending provider between 7A-7P or the covering provider during after hours 7P-7A, please log into the web site [email or web address] and access using universal [place] password for that web site. If you do not have the password, please call the hospital operator.  07/23/2020, 2:05 PM

## 2020-07-24 DIAGNOSIS — R5381 Other malaise: Secondary | ICD-10-CM

## 2020-07-24 LAB — CBC WITH DIFFERENTIAL/PLATELET
Abs Immature Granulocytes: 0.06 10*3/uL (ref 0.00–0.07)
Basophils Absolute: 0 10*3/uL (ref 0.0–0.1)
Basophils Relative: 0 %
Eosinophils Absolute: 2.2 10*3/uL — ABNORMAL HIGH (ref 0.0–0.5)
Eosinophils Relative: 20 %
HCT: 29.4 % — ABNORMAL LOW (ref 36.0–46.0)
Hemoglobin: 8.4 g/dL — ABNORMAL LOW (ref 12.0–15.0)
Immature Granulocytes: 1 %
Lymphocytes Relative: 18 %
Lymphs Abs: 2 10*3/uL (ref 0.7–4.0)
MCH: 25.8 pg — ABNORMAL LOW (ref 26.0–34.0)
MCHC: 28.6 g/dL — ABNORMAL LOW (ref 30.0–36.0)
MCV: 90.2 fL (ref 80.0–100.0)
Monocytes Absolute: 0.5 10*3/uL (ref 0.1–1.0)
Monocytes Relative: 5 %
Neutro Abs: 6.3 10*3/uL (ref 1.7–7.7)
Neutrophils Relative %: 56 %
Platelets: 360 10*3/uL (ref 150–400)
RBC: 3.26 MIL/uL — ABNORMAL LOW (ref 3.87–5.11)
RDW: 19.1 % — ABNORMAL HIGH (ref 11.5–15.5)
WBC: 11 10*3/uL — ABNORMAL HIGH (ref 4.0–10.5)
nRBC: 0 % (ref 0.0–0.2)

## 2020-07-24 LAB — COMPREHENSIVE METABOLIC PANEL
ALT: 14 U/L (ref 0–44)
AST: 16 U/L (ref 15–41)
Albumin: 2.7 g/dL — ABNORMAL LOW (ref 3.5–5.0)
Alkaline Phosphatase: 84 U/L (ref 38–126)
Anion gap: 9 (ref 5–15)
BUN: 11 mg/dL (ref 8–23)
CO2: 35 mmol/L — ABNORMAL HIGH (ref 22–32)
Calcium: 8.7 mg/dL — ABNORMAL LOW (ref 8.9–10.3)
Chloride: 96 mmol/L — ABNORMAL LOW (ref 98–111)
Creatinine, Ser: 0.76 mg/dL (ref 0.44–1.00)
GFR, Estimated: >60 mL/min (ref >60)
Glucose, Bld: 143 mg/dL — ABNORMAL HIGH (ref 70–99)
Potassium: 3.7 mmol/L (ref 3.5–5.1)
Sodium: 140 mmol/L (ref 135–145)
Total Bilirubin: 1 mg/dL (ref 0.3–1.2)
Total Protein: 6.3 g/dL — ABNORMAL LOW (ref 6.5–8.1)

## 2020-07-24 LAB — MAGNESIUM: Magnesium: 1.9 mg/dL (ref 1.7–2.4)

## 2020-07-24 LAB — PHOSPHORUS: Phosphorus: 3 mg/dL (ref 2.5–4.6)

## 2020-07-24 LAB — GLUCOSE, CAPILLARY
Glucose-Capillary: 146 mg/dL — ABNORMAL HIGH (ref 70–99)
Glucose-Capillary: 259 mg/dL — ABNORMAL HIGH (ref 70–99)
Glucose-Capillary: 181 mg/dL — ABNORMAL HIGH (ref 70–99)
Glucose-Capillary: 122 mg/dL — ABNORMAL HIGH (ref 70–99)

## 2020-07-24 LAB — BRAIN NATRIURETIC PEPTIDE: B Natriuretic Peptide: 228.2 pg/mL — ABNORMAL HIGH (ref 0.0–100.0)

## 2020-07-24 LAB — PROCALCITONIN: Procalcitonin: 0.29 ng/mL

## 2020-07-24 MED ORDER — SODIUM CHLORIDE 0.9 % IV SOLN
2.0000 g | Freq: Two times a day (BID) | INTRAVENOUS | Status: DC
  Administered 2020-07-24 – 2020-07-26 (×4): 2 g via INTRAVENOUS
  Filled 2020-07-24 (×5): qty 2

## 2020-07-24 NOTE — Plan of Care (Signed)
  Problem: Elimination: Goal: Will not experience complications related to urinary retention Outcome: Progressing   Problem: Pain Managment: Goal: General experience of comfort will improve Outcome: Progressing   

## 2020-07-24 NOTE — Progress Notes (Signed)
PROGRESS NOTE    Crystal Ashley  INO:[id number] DOB: [birth year] DOA: 07/20/2020 PCP: [name], PA     Brief Narrative:  74 y.o. female PMHx Permanent A. fib on Xarelto, Chronic Diastolic CHF , Hypothyroidism, GERD, DM type II type controlled with hyperglycemia , obesity, HLD, bed-bound with pressure ulcer. Presented to [facility] ED with complaints of gradually worsening shortness of breath of 2 days duration.  Associated with orthopnea and hypoxia.  She has been compliant with her home diuretic.  EMS was activated, found to be hypoxic with O2 saturation in the 80's on room air, improved on 2L [place].  She denies chest pain, fevers, cough, or GI symptoms.  Volume overload on exam, imaging consistent with pulmonary edema, bilateral pleural effusions, and concern for acute on chronic diastolic CHF.  Due to leukocytosis, EDP started Rocephin for possible CAP. TRH, hospitalist team, asked to admit. In the ED, Lab studies remarkable for WBC 17.7K, hemoglobin 7.1K, neutrophil count 12.9, platelet count 503K.  Patient admitted for further management    Subjective: Patient denies any new complaints, denies any chest pain, worsening shortness of breath, abdominal pain, nausea/vomiting, fever/chills.  Noted cough   Assessment & Plan:   Active Problems:   Atrial fibrillation with RVR (HCC)   Acute respiratory failure with hypoxia (HCC)   Bilateral pleural effusion   Pulmonary edema   Acute on chronic diastolic CHF (congestive heart failure) (HCC)   Chronic blood loss anemia   Controlled type 2 diabetes mellitus with hyperglycemia (HCC)   Hypothyroidism, adult   Physical deconditioning   Acute respiratory failure with hypoxia 2/2 pulmonary edema/acute on chronic diastolic HF, bilateral pleural effusion Multifactorial acute on chronic CHF, A. fib RVR, pleural effusion Presented with O2 saturation in 80s on room air improved on 2 L O2 CTA chest negative PE Chest x-ray consistent with pulmonary  edema and bilateral pleural effusions Echocardiogram done on 07/22/2020 showed EF of 60 to 65%, unable to evaluate for regional wall motion, left ventricular diastolic function could not be evaluated Cardiology on board, continue Lasix, Aldactone Strict I's and O's, daily weight  Paroxysmal A. fib RVR Currently rate controlled Continue amiodarone, diltiazem, Xarelto  Chronic Blood Loss Anemia Anemia of chronic disease S/p 1 unit of PRBC on 12/6 Anemia panel WNL Fecal occult blood negative Daily CBC  Leukocytosis ?HCAP Procalcitonin 0.35--> 0.29 UA unremarkable, UC with multiple species, BC x2 NGTD Repeat chest x-ray on 07/23/2020 showed patchy opacity in the left perihilar region concerning for infiltrate/pneumonia We will start cefepime to cover for HCAP  DM type II controlled with hyperglycemia 10/10 hemoglobin A1c = 6.1  Sensitive SSI  Hypothyroidism Continue Synthroid 75 mcg daily   GERD Stable Resume PPI  Physical deconditioning PT OT to assess Fall precautions  Bed-bound with pressure ulcers, POA Wound care specialist consult Local wound care  Obesity Lifestyle modification advised      DVT prophylaxis: Xarelto Code Status: DNR Family Communication: None at bedside  Status is: Inpatient    Dispo: The patient is from: Home              Anticipated d/c is to: Home              Anticipated d/c date is: in 3 days              Patient currently unstable      Consultants:  Cardiology  Procedures/Significant Events:    Cultures   Antimicrobials: Anti-infectives (From admission, onward)   Start  Ordered Stop   07/22/20 0800  cefTRIAXone (ROCEPHIN) 1 g in sodium chloride 0.9 % 100 mL IVPB        07/21/20 0613     07/21/20 0315  cefTRIAXone (ROCEPHIN) 1 g in sodium chloride 0.9 % 100 mL IVPB        07/21/20 0309 07/21/20 0606        Objective: Vitals:   07/24/20 0744 07/24/20 0835 07/24/20 1141 07/24/20 1608  BP: 118/68 108/75  108/66 (!) 102/52  Pulse: 81 94 65 76  Resp: 18 18 18 18   Temp: 98.2 F (36.8 C) 98.1 F (36.7 C) 98.4 F (36.9 C) 99.2 F (37.3 C)  TempSrc: Oral Oral Oral Oral  SpO2: 100% 100% 97% 96%  Weight:      Height:        Intake/Output Summary (Last 24 hours) at 07/24/2020 1706 Last data filed at 07/24/2020 1609 Gross per 24 hour  Intake 200 ml  Output 1650 ml  Net -1450 ml   Filed Weights   07/21/20 0007 07/23/20 0500 07/24/20 0453  Weight: 84.6 kg 84.4 kg 84.4 kg    Examination:  General: NAD   Cardiovascular: S1, S2 present  Respiratory:  Diminished breath sounds bilaterally  Abdomen: Soft, nontender, nondistended, bowel sounds present  Musculoskeletal: 1+ bilateral pedal edema noted  Skin: Normal  Psychiatry: Normal mood    CBC: Recent Labs  Lab 07/21/20 0018 07/21/20 0018 07/21/20 0455 07/21/20 1600 07/22/20 0439 07/23/20 0640 07/24/20 0507  WBC 17.7*  --   --  11.1* 10.5 10.7* 11.0*  NEUTROABS 12.9*  --   --   --  7.0 6.9 6.3  HGB 7.1*   < > 7.6* 7.9* 7.1* 8.5* 8.4*  HCT 24.5*   < > 27.5* 28.1* 24.0* 27.9* 29.4*  MCV 92.8  --   --  91.2 90.2 87.2 90.2  PLT 503*  --   --  416* 325 347 360   < > = values in this interval not displayed.   Basic Metabolic Panel: Recent Labs  Lab 07/21/20 0018 07/21/20 1600 07/22/20 0439 07/23/20 0640 07/24/20 0507  NA 136 139 140 141 140  K 3.5 3.5 3.5 2.9* 3.7  CL 95* 97* 98 96* 96*  CO2 29 31 33* 33* 35*  GLUCOSE 250* 166* 162* 162* 143*  BUN 15 11 10 12 11   CREATININE 0.96 0.89 0.85 0.80 0.76  CALCIUM 8.9 8.6* 8.9 8.6* 8.7*  MG  --  1.8 1.8 1.8 1.9  PHOS  --  3.2 3.6 3.3 3.0   GFR: Estimated Creatinine Clearance: 59.5 mL/min (by C-G formula based on SCr of 0.76 mg/dL). Liver Function Tests: Recent Labs  Lab 07/21/20 0018 07/22/20 0439 07/23/20 0640 07/24/20 0507  AST 19 12* 16 16  ALT 8 10 13 14   ALKPHOS 73 55 69 84  BILITOT 1.2 1.0 0.7 1.0  PROT 6.6 6.1* 6.3* 6.3*  ALBUMIN 2.8* 2.9* 2.8*  2.7*   No results for input(s): LIPASE, AMYLASE in the last 168 hours. No results for input(s): AMMONIA in the last 168 hours. Coagulation Profile: No results for input(s): INR, PROTIME in the last 168 hours. Cardiac Enzymes: No results for input(s): CKTOTAL, CKMB, CKMBINDEX, TROPONINI in the last 168 hours. BNP (last 3 results) No results for input(s): PROBNP in the last 8760 hours. HbA1C: Recent Labs    07/22/20 0439  HGBA1C 6.0*   CBG: Recent Labs  Lab 07/23/20 1629 07/23/20 2058 07/24/20 0743 07/24/20 1139 07/24/20 1606  GLUCAP 180* 119* 146* 259* 181*   Lipid Profile: Recent Labs    07/22/20 0439  CHOL 98  HDL 30*  LDLCALC 51  TRIG 87  CHOLHDL 3.3   Thyroid Function Tests: Recent Labs    07/22/20 0439  TSH 2.717   Anemia Panel: No results for input(s): VITAMINB12, FOLATE, FERRITIN, TIBC, IRON, RETICCTPCT in the last 72 hours. Sepsis Labs: Recent Labs  Lab 07/21/20 0310 07/21/20 0542 07/21/20 0811 07/23/20 1520 07/24/20 0507  PROCALCITON  --   --  0.10 0.35 0.29  LATICACIDVEN 1.0 1.0  --   --   --     Recent Results (from the past 240 hour(s))  Resp Panel by RT-PCR (Flu A&B, Covid) Nasopharyngeal Swab     Status: None   Collection Time: 07/21/20  1:36 AM   Specimen: Nasopharyngeal Swab; Nasopharyngeal(NP) swabs in vial transport medium  Result Value Ref Range Status   SARS Coronavirus 2 by RT PCR NEGATIVE NEGATIVE Final    Comment: (NOTE) SARS-CoV-2 target nucleic acids are NOT DETECTED.  The SARS-CoV-2 RNA is generally detectable in upper respiratory specimens during the acute phase of infection. The lowest concentration of SARS-CoV-2 viral copies this assay can detect is 138 copies/mL. A negative result does not preclude SARS-Cov-2 infection and should not be used as the sole basis for treatment or other patient management decisions. A negative result may occur with  improper specimen collection/handling, submission of specimen other than  nasopharyngeal swab, presence of viral mutation(s) within the areas targeted by this assay, and inadequate number of viral copies(<138 copies/mL). A negative result must be combined with clinical observations, patient history, and epidemiological information. The expected result is Negative.  Fact Sheet for Patients:  EntrepreneurPulse.com.au  Fact Sheet for Healthcare Providers:  IncredibleEmployment.be  This test is no t yet approved or cleared by the [place] FDA and  has been authorized for detection and/or diagnosis of SARS-CoV-2 by FDA under an Emergency Use Authorization (EUA). This EUA will remain  in effect (meaning this test can be used) for the duration of the COVID-19 declaration under Section 564(b)(1) of the Act, 21 U.S.C.section 360bbb-3(b)(1), unless the authorization is terminated  or revoked sooner.       Influenza A by PCR NEGATIVE NEGATIVE Final   Influenza B by PCR NEGATIVE NEGATIVE Final    Comment: (NOTE) The Xpert Xpress SARS-CoV-2/FLU/RSV plus assay is intended as an aid in the diagnosis of influenza from Nasopharyngeal swab specimens and should not be used as a sole basis for treatment. Nasal washings and aspirates are unacceptable for Xpert Xpress SARS-CoV-2/FLU/RSV testing.  Fact Sheet for Patients: EntrepreneurPulse.com.au  Fact Sheet for Healthcare Providers: IncredibleEmployment.be  This test is not yet approved or cleared by the [place] FDA and has been authorized for detection and/or diagnosis of SARS-CoV-2 by FDA under an Emergency Use Authorization (EUA). This EUA will remain in effect (meaning this test can be used) for the duration of the COVID-19 declaration under Section 564(b)(1) of the Act, 21 U.S.C. section 360bbb-3(b)(1), unless the authorization is terminated or revoked.  Performed at [facility], [place] [address] [place], [place] [address]    Blood culture (routine x 2)     Status: None (Preliminary result)   Collection Time: 07/21/20  5:37 AM   Specimen: BLOOD  Result Value Ref Range Status   Specimen Description BLOOD RIGHT ARM  Final   Special Requests   Final    BOTTLES DRAWN AEROBIC AND ANAEROBIC Blood  Culture adequate volume   Culture   Final    NO GROWTH 3 DAYS Performed at [facility], [place] [address] [place], [place] [address]    Report Status PENDING  Incomplete  Culture, Urine     Status: Abnormal   Collection Time: 07/21/20  6:50 AM   Specimen: Urine, Random  Result Value Ref Range Status   Specimen Description URINE, RANDOM  Final   Special Requests   Final    NONE Performed at [facility], [place] [address], [place], [place] [address]    Culture MULTIPLE SPECIES PRESENT, SUGGEST RECOLLECTION (A)  Final   Report Status 07/22/2020 FINAL  Final  Blood culture (routine x 2)     Status: None (Preliminary result)   Collection Time: 07/21/20  9:36 AM   Specimen: BLOOD RIGHT HAND  Result Value Ref Range Status   Specimen Description BLOOD RIGHT HAND  Final   Special Requests   Final    BOTTLES DRAWN AEROBIC ONLY Blood Culture adequate volume   Culture   Final    NO GROWTH 3 DAYS Performed at [facility], [place] [address], [place],  [address]    Report Status PENDING  Incomplete         Radiology Studies: DG Chest 2 View  Result Date: 07/23/2020 CLINICAL DATA:  Pleural effusion EXAM: CHEST - 2 VIEW COMPARISON:  07/22/2020 FINDINGS: No visible pleural effusion or pneumothorax. Patchy opacity in the left perihilar region. Right lung clear. Heart is mildly enlarged. Right PICC line is in place with the tip at the cavoatrial junction. IMPRESSION: Mild cardiomegaly. Patchy opacity in the left perihilar region concerning for infiltrate/pneumonia. No effusion visualized. Electronically Signed   By: Rolm Baptise M.D.   On: 07/23/2020 12:27   DG CHEST PORT 1 VIEW  Result Date:  07/22/2020 CLINICAL DATA:  Status post PICC line placement. EXAM: PORTABLE CHEST 1 VIEW COMPARISON:  July 21, 2020 FINDINGS: A right-sided PICC line is seen with its distal tip noted just beyond the junction of the superior vena cava and right atrium. Mild diffuse hazy areas of atelectasis and/or infiltrate are seen throughout both lungs. There is no evidence of a pleural effusion or pneumothorax. The cardiac silhouette is moderately enlarged. Bilateral pedicle screws are seen within the lower thoracic spine. IMPRESSION: Right-sided PICC line with its distal tip noted just beyond the junction of the superior vena cava and right atrium. Electronically Signed   By: Virgina Norfolk M.D.   On: 07/22/2020 19:33        Scheduled Meds: . albuterol  5 mg Nebulization Once  . amiodarone  200 mg Oral BID  . atorvastatin  10 mg Oral Daily  . Chlorhexidine Gluconate Cloth  6 each Topical Daily  . diltiazem  120 mg Oral Daily  . furosemide  40 mg Intravenous Q12H  . insulin aspart  0-5 Units Subcutaneous QHS  . insulin aspart  0-9 Units Subcutaneous TID WC  . levothyroxine  75 mcg Oral QAC breakfast  . metoprolol tartrate  12.5 mg Oral BID  . pantoprazole  40 mg Oral BID  . potassium chloride  40 mEq Oral BID  . rivaroxaban  20 mg Oral Q supper  . sodium chloride flush  10-40 mL Intracatheter Q12H  . spironolactone  25 mg Oral Daily  . Travoprost (BAK Free)  1 drop Both Eyes QHS  . vitamin B-12  500 mcg Oral Daily   Continuous Infusions:  LOS: 3 days    Alma Friendly, MD [facility]  If 7PM-7AM, please contact night-coverage 07/24/2020, 5:06 PM

## 2020-07-24 NOTE — Progress Notes (Addendum)
Primary Physician/Referring:  [name], PA  Patient ID: Crystal Ashley, female    DOB: 03/03/1946, 74 y.o.   MRN: [id number]  Chief Complaint  Patient presents with  . Shortness of Breath   HPI:    Crystal Ashley  is a 74 y.o.  Asian [place] female with type 2 diabetes, hyperlipidemia, hypertension, hypothyroidism, history of endometrial cancer in 2015, permanent atrial fibrillationon Xarelto history of uterine cancer status post hysterectomy, with poor functional status since pelvic fracture in June 2021 was discharged to rehab, has had inpatient and outpatient rehab, also underwent fusion of T10 and T11 fractures after the fall on 02/26/2020.  She has been bedridden/wheelchair bound since hospital discharge.  Does very minimal activity.  She is now admitted to the hospital with acute exacerbation and heart failure.  I was asked to see the patient for management of A. fib with RVR and CHF.  She was also found to be severely anemic, baseline hemoglobin had decreased from around 9+ to around 8+ and hence received 1 unit of packed RBCs on 07/22/2020.  No obvious dark stools per patient, no bloody stool.  She has been tolerating anticoagulation.  Denies any recent fall. Presently states that she is doing okay but still short of breath.  Denies fever or chills.  She states that she has not been able to do much, essentially stays in bed all day or sits on a chair.   Past Medical History:  Diagnosis Date  . Atrial fibrillation ([place])   . Cancer ([place])   . Cataract   . Diabetes mellitus without complication ([place])   . Fracture 2001   left ankle  . Glaucoma   . Hypertension   . Thyroid disease    Past Surgical History:  Procedure Laterality Date  . APPLICATION OF ROBOTIC ASSISTANCE FOR SPINAL PROCEDURE N/A 02/26/2020   Procedure: APPLICATION OF ROBOTIC ASSISTANCE FOR SPINAL PROCEDURE;  Surgeon: [name], MD;  Location: [facility];  Service: Neurosurgery;  Laterality: N/A;  .  CARDIOVERSION N/A 04/08/2018   Procedure: CARDIOVERSION;  Surgeon: Adrian Prows, MD;  Location: [facility] ENDOSCOPY;  Service: Cardiovascular;  Laterality: N/A;  . CARDIOVERSION N/A 05/22/2019   Procedure: CARDIOVERSION;  Surgeon: Adrian Prows, MD;  Location: [facility];  Service: Cardiovascular;  Laterality: N/A;  . FOOT SURGERY Left   . HYSTEROSCOPY     POLYPECTOMY  . LUMBAR PERCUTANEOUS PEDICLE SCREW 4 LEVEL N/A 02/26/2020   Procedure: Thoracic eight to thoracic twelve posterior percutaneous instrumentation with cement augmentation and bilateral medial facetectomy for decompression at Thoracic ten-eleven;  Surgeon: [name], MD;  Location: [facility];  Service: Neurosurgery;  Laterality: N/A;   Social History   Tobacco Use  . Smoking status: Never Smoker  . Smokeless tobacco: Never Used  Substance Use Topics  . Alcohol use: No   Marital Status: Married   ROS  Review of Systems  Cardiovascular: Positive for dyspnea on exertion. Negative for chest pain and leg swelling.  Respiratory: Positive for cough.   Musculoskeletal: Positive for arthritis, back pain and muscle weakness.  Gastrointestinal: Negative for melena.  All other systems reviewed and are negative.  Objective    Vitals with BMI 07/24/2020 07/24/2020 07/24/2020  Height - - -  Weight - - 186 lbs  BMI - - 28.36  Systolic 629 476 -  Diastolic 75 68 -  Pulse 94 81 -    Physical Exam Constitutional:      General: She is not in acute  distress.    Appearance: She is obese.     Comments: Moderately built and moderately obese in no acute distress.  Neck:     Thyroid: No thyromegaly.     Vascular: No JVD.  Cardiovascular:     Rate and Rhythm: Normal rate. Rhythm irregular.     Pulses: Normal pulses and intact distal pulses.     Heart sounds: No murmur heard.  No gallop. No S3 or S4 sounds.      Comments: S1 is variable, S2 is normal.  No leg edema.  Pulmonary:     Effort: Pulmonary effort is normal.     Breath sounds:  Normal breath sounds.  Abdominal:     General: Bowel sounds are normal.     Palpations: Abdomen is soft.  Musculoskeletal:     Cervical back: Normal range of motion.  Neurological:     Mental Status: She is alert.    Laboratory examination:   Recent Labs    02/19/20 0659 02/19/20 0659 02/25/20 0830 02/26/20 0839 02/28/20 1508 05/22/20 1128 07/22/20 0439 07/23/20 0640 07/24/20 0507  NA 141  --   --    < > 139   < > 140 141 140  K 3.8  --   --    < > 3.7   < > 3.5 2.9* 3.7  CL 103   < >  --   --  102   < > 98 96* 96*  CO2 28   < >  --   --  27   < > 33* 33* 35*  GLUCOSE 146*   < >  --   --  275*   < > 162* 162* 143*  BUN 14   < >  --   --  15   < > 10 12 11   CREATININE 0.93   < > 0.85  --  0.81   < > 0.85 0.80 0.76  CALCIUM 8.5*   < >  --   --  8.2*   < > 8.9 8.6* 8.7*  GFRNONAA >60   < > >60  --  >60   < > >60 >60 >60  GFRAA >60  --  >60  --  >60  --   --   --   --    < > = values in this interval not displayed.   CMP Latest Ref Rng & Units 07/24/2020 07/23/2020 07/22/2020  Glucose 70 - 99 mg/dL 143(H) 162(H) 162(H)  BUN 8 - 23 mg/dL 11 12 10   Creatinine 0.44 - 1.00 mg/dL 0.76 0.80 0.85  Sodium 135 - 145 mmol/L 140 141 140  Potassium 3.5 - 5.1 mmol/L 3.7 2.9(L) 3.5  Chloride 98 - 111 mmol/L 96(L) 96(L) 98  CO2 22 - 32 mmol/L 35(H) 33(H) 33(H)  Calcium 8.9 - 10.3 mg/dL 8.7(L) 8.6(L) 8.9  Total Protein 6.5 - 8.1 g/dL 6.3(L) 6.3(L) 6.1(L)  Total Bilirubin 0.3 - 1.2 mg/dL 1.0 0.7 1.0  Alkaline Phos 38 - 126 U/L 84 69 55  AST 15 - 41 U/L 16 16 12(L)  ALT 0 - 44 U/L 14 13 10    CBC Latest Ref Rng & Units 07/24/2020 07/23/2020 07/22/2020  WBC 4.0 - 10.5 K/uL 11.0(H) 10.7(H) 10.5  Hemoglobin 12.0 - 15.0 g/dL 8.4(L) 8.5(L) 7.1(L)  Hematocrit 36 - 46 % 29.4(L) 27.9(L) 24.0(L)  Platelets 150 - 400 K/uL 360 347 325   Lipid Panel     Component Value Date/Time   CHOL 98  07/22/2020 0439   TRIG 87 07/22/2020 0439   HDL 30 (L) 07/22/2020 0439   CHOLHDL 3.3 07/22/2020 0439   VLDL  17 07/22/2020 0439   LDLCALC 51 07/22/2020 0439   HEMOGLOBIN A1C Lab Results  Component Value Date   HGBA1C 6.0 (H) 07/22/2020   MPG 125.5 07/22/2020   TSH Recent Labs    05/26/20 1142 07/21/20 0812 07/22/20 0439  TSH 5.001* 2.412 2.717   BNP    Component Value Date/Time   BNP 228.2 (H) 07/24/2020 0930   Medications   No current facility-administered medications on file prior to encounter.   Current Outpatient Medications on File Prior to Encounter  Medication Sig Dispense Refill  . atorvastatin (LIPITOR) 10 MG tablet Take 1 tablet (10 mg total) by mouth daily. 90 tablet 3  . Calcium-Phosphorus-Vitamin D (CITRACAL +D3 PO) Take 1 tablet by mouth daily.    Marland Kitchen diltiazem (CARDIZEM CD) 240 MG 24 hr capsule Take 1 capsule (240 mg total) by mouth daily. 90 capsule 0  . dorzolamide-timolol (COSOPT) 22.3-6.8 MG/ML ophthalmic solution Place 1 drop into both eyes 2 (two) times daily.     . furosemide (LASIX) 40 MG tablet Take 1 tablet (40 mg total) by mouth daily. 90 tablet 1  . levothyroxine (SYNTHROID) 75 MCG tablet Take 1 tablet (75 mcg total) by mouth daily before breakfast. 90 tablet 3  . Menthol, Topical Analgesic, (BIOFREEZE) 4 % GEL Apply 1 application topically daily as needed (pain). Apply to right side rib cage and lateral thigh    . metFORMIN (GLUCOPHAGE) 1000 MG tablet Take 1 tablet (1,000 mg total) by mouth daily with breakfast. 90 tablet 3  . pantoprazole (PROTONIX) 40 MG tablet Take 1 tablet (40 mg total) by mouth daily. 30 tablet 2  . potassium chloride (MICRO-K) 10 MEQ CR capsule Take 10 mEq by mouth daily.    . rivaroxaban (XARELTO) 20 MG TABS tablet Take 1 tablet (20 mg total) by mouth daily with supper. (Patient taking differently: Take 20 mg by mouth daily. ) 90 tablet 1  . sennosides (SENOKOT) 8.8 MG/5ML syrup Take 10 mLs by mouth daily as needed for mild constipation.     . travoprost, benzalkonium, (TRAVATAN) 0.004 % ophthalmic solution Place 1 drop into both eyes  at bedtime.     . vitamin B-12 (CYANOCOBALAMIN) 500 MCG tablet Take 500 mcg by mouth daily.     Elmore Guise Devices (LANCET DEVICE WITH EJECTOR) MISC Use to check blood sugar daily and prn 1 each 1  . Lancets 33G MISC Use to check blood sugar daily and prn 100 each 3  . methocarbamol (ROBAXIN) 500 MG tablet Take 1 tablet (500 mg total) by mouth every 6 (six) hours as needed for muscle spasms. (Patient not taking: Reported on 07/21/2020) 60 tablet 0  . polyethylene glycol (MIRALAX / GLYCOLAX) 17 g packet Take 17 g by mouth daily as needed for mild constipation. (Patient not taking: Reported on 07/21/2020) 14 each 0    Scheduled Meds: . albuterol  5 mg Nebulization Once  . amiodarone  200 mg Oral BID  . atorvastatin  10 mg Oral Daily  . Chlorhexidine Gluconate Cloth  6 each Topical Daily  . diltiazem  120 mg Oral Daily  . furosemide  40 mg Intravenous Q12H  . insulin aspart  0-5 Units Subcutaneous QHS  . insulin aspart  0-9 Units Subcutaneous TID WC  . levothyroxine  75 mcg Oral QAC breakfast  . metoprolol tartrate  12.5 mg  Oral BID  . pantoprazole  40 mg Oral BID  . potassium chloride  40 mEq Oral BID  . rivaroxaban  20 mg Oral Q supper  . sodium chloride flush  10-40 mL Intracatheter Q12H  . spironolactone  25 mg Oral Daily  . Travoprost (BAK Free)  1 drop Both Eyes QHS  . vitamin B-12  500 mcg Oral Daily   Continuous Infusions: PRN Meds:.sodium chloride flush  Cardiac Studies:   Lexiscan myoview stress test 04/01/2018: 1. Lexiscan stress test was performed. Exercise capacity was not assessed. No stress symptoms reported. Normal blood pressure. The resting and stress electrocardiogram demonstrated atrial fibrillation with rapid ventricular rate, low voltage, and normal rest repolarization. Stress EKG is non diagnostic for ischemia as it is a pharmacologic stress. 2. The overall quality of the study is fair. Review of the raw data in a rotational cine format reveals breast attenuation,  with imaging performed in sitting position. Gated SPECT images reveal normal myocardial thickening and wall motion. The left ventricular ejection fraction was calculated or visually estimated to be 47%. REST and STRESS images demonstrate decreased tracer uptake in the mid inferoseptal, mid inferior, apical septal and apical inferior segments of the left ventricle, worse on rest images. While defect likely represents breast attenuation, ischemia in this region cannot be excluded. Clinical correlation recommended. 3. Low to intermediate risk study.  Echocardiogram 07/22/2020 Comparison: 10/19/2018 : 1. Left ventricular ejection fraction, by estimation, is 60 to 65%. The left ventricle has normal function. Left ventricular endocardial border not optimally defined to evaluate regional wall motion. Left ventricular diastolic function could not be evaluated.   2. Right ventricular systolic function is normal. The right ventricular size is normal. There is mildly elevated pulmonary artery systolic pressure. The estimated right ventricular systolic pressure is 17.4 mmHg.   3. The mitral valve is grossly normal. No evidence of mitral valve regurgitation. No evidence of mitral stenosis.   4. The aortic valve is grossly normal. Aortic valve regurgitation is not visualized. No aortic stenosis is present.   5. The inferior vena cava is dilated in size with <50% respiratory variability, suggesting right atrial pressure of 15 mmHg. Comparison(s): No significant change from prior study.   EKG:  EKG 07/22/2020: Atrial fibrillation with rapid ventricular response at rate of 104 bpm, normal axis, low-voltage complexes.  Normal QT interval, no evidence of ischemia.  EKG 06/25/2020: Atrial fibrillation with controlled ventricular response at the rate of 70 bpm, normal axis, T wave abnormality, anteroseptal ischemia.   Assessment     ICD-10-CM   1. Acute on chronic diastolic congestive heart failure (HCC)   I50.33   2. Acute hypoxemic respiratory failure (HCC)  J96.01   3. Pleural effusion  J90 DG Chest 2 View    DG Chest 2 View  4. S/P PICC central line placement  Z95.828 DG CHEST PORT 1 VIEW    DG CHEST PORT 1 VIEW         Recommendations:   Crystal Ashley  is a 74 y.o. Asian [place] female with type 2 diabetes, hyperlipidemia, hypertension, hypothyroidism, history of endometrial cancer in 2015, permanent atrial fibrillationon Xarelto history of uterine cancer status post hysterectomy, with poor functional status since pelvic fracture in June 2021 was discharged to rehab, has had inpatient and outpatient rehab, also underwent fusion of T10 and T11 fractures after the fall on 02/26/2020.  She has been bedridden/wheelchair bound since hospital discharge.  Does very minimal activity.  She is now admitted  to the hospital with acute exacerbation and heart failure.  I was asked to see the patient for management of A. fib with RVR and CHF.  I had started her on oral amiodarone and discontinued IV amiodarone and also added beta blocker yesterday, she is tolerating this, heart rate is well controlled.  With regard to heart failure, she is not in any more acute respiratory distress that she was seen yesterday, lung sounds are also improved.  She is presently on IV Lasix twice daily, would continue the same today and transition her to oral Lasix tomorrow.  She is also tolerating Aldactone, renal function has remained stable and potassium levels are normal. She will need work up for anemia. Appears to be probably from chronic disease.  Would start her on oral Lasix tomorrow at 40 mg daily.  Continue amiodarone at 200 mg daily upon discharge.  Can change metoprolol to tartrate 12.5 mg twice daily to metoprolol succinate 25 mg daily upon discharge.  Also continue Aldactone 25 mg daily.  Please call if questions.    Adrian Prows, MD, [facility] 07/24/2020, 9:11 AM Office: [phone] Pager: [phone]

## 2020-07-24 NOTE — Progress Notes (Signed)
Pharmacy Antibiotic Note  Crystal Ashley is a 74 y.o. female admitted on 07/20/2020 with pneumonia.  Pharmacy has been consulted for cefepime dosing.  CXR on 12/7 showing patchy opacity in L perihilar region concerning for PNA. Received 2 doses of ceftriaxone - last dose 12/6. WBC 11, PCT 0.1>0.35>0.29, Scr 0.7 (CrCl 59 mL/min).   Plan: Cefepime 2g IV every 12 hours  Monitor renal fx, cx results, clinical pic, and for duration of therapy   Height: 5' (152.4 cm) Weight: 84.4 kg (186 lb) IBW/kg (Calculated) : 45.5  Temp (24hrs), Avg:98.4 F (36.9 C), Min:97.8 F (36.6 C), Max:99.2 F (37.3 C)  Recent Labs  Lab 07/21/20 0018 07/21/20 0310 07/21/20 0542 07/21/20 1600 07/22/20 0439 07/23/20 0640 07/24/20 0507  WBC 17.7*  --   --  11.1* 10.5 10.7* 11.0*  CREATININE 0.96  --   --  0.89 0.85 0.80 0.76  LATICACIDVEN  --  1.0 1.0  --   --   --   --     Estimated Creatinine Clearance: 59.5 mL/min (by C-G formula based on SCr of 0.76 mg/dL).    Allergies  Allergen Reactions  . 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    Antimicrobials this admission: Ceftriaxone 12/5 >> 12/6 Cefepime 12/8>>  Dose adjustments this admission: N/A  Microbiology results: 12/5 BCx: ngtd 12/5 UCx: multiple species, suggest recollection   Thank you for allowing pharmacy to be a part of this patient's care.  Antonietta Jewel, PharmD, [place] Clinical Pharmacist  Phone: [phone] 07/24/2020 6:14 PM  Please check AMION for all [address] phone numbers After 10:00 PM, call [place] [phone]

## 2020-07-25 LAB — CBC WITH DIFFERENTIAL/PLATELET
Abs Immature Granulocytes: 0.06 10*3/uL (ref 0.00–0.07)
Basophils Absolute: 0 10*3/uL (ref 0.0–0.1)
Basophils Relative: 0 %
Eosinophils Absolute: 2.1 10*3/uL — ABNORMAL HIGH (ref 0.0–0.5)
Eosinophils Relative: 19 %
HCT: 31.3 % — ABNORMAL LOW (ref 36.0–46.0)
Hemoglobin: 9 g/dL — ABNORMAL LOW (ref 12.0–15.0)
Immature Granulocytes: 1 %
Lymphocytes Relative: 21 %
Lymphs Abs: 2.3 10*3/uL (ref 0.7–4.0)
MCH: 26 pg (ref 26.0–34.0)
MCHC: 28.8 g/dL — ABNORMAL LOW (ref 30.0–36.0)
MCV: 90.5 fL (ref 80.0–100.0)
Monocytes Absolute: 0.5 10*3/uL (ref 0.1–1.0)
Monocytes Relative: 4 %
Neutro Abs: 6.1 10*3/uL (ref 1.7–7.7)
Neutrophils Relative %: 55 %
Platelets: 387 10*3/uL (ref 150–400)
RBC: 3.46 MIL/uL — ABNORMAL LOW (ref 3.87–5.11)
RDW: 19 % — ABNORMAL HIGH (ref 11.5–15.5)
WBC: 11.2 10*3/uL — ABNORMAL HIGH (ref 4.0–10.5)
nRBC: 0 % (ref 0.0–0.2)

## 2020-07-25 LAB — COMPREHENSIVE METABOLIC PANEL
ALT: 13 U/L (ref 0–44)
AST: 15 U/L (ref 15–41)
Albumin: 2.8 g/dL — ABNORMAL LOW (ref 3.5–5.0)
Alkaline Phosphatase: 77 U/L (ref 38–126)
Anion gap: 11 (ref 5–15)
BUN: 14 mg/dL (ref 8–23)
CO2: 35 mmol/L — ABNORMAL HIGH (ref 22–32)
Calcium: 9.2 mg/dL (ref 8.9–10.3)
Chloride: 95 mmol/L — ABNORMAL LOW (ref 98–111)
Creatinine, Ser: 0.96 mg/dL (ref 0.44–1.00)
GFR, Estimated: >60 mL/min (ref >60)
Glucose, Bld: 157 mg/dL — ABNORMAL HIGH (ref 70–99)
Potassium: 4.3 mmol/L (ref 3.5–5.1)
Sodium: 141 mmol/L (ref 135–145)
Total Bilirubin: 0.9 mg/dL (ref 0.3–1.2)
Total Protein: 6.9 g/dL (ref 6.5–8.1)

## 2020-07-25 LAB — GLUCOSE, CAPILLARY
Glucose-Capillary: 158 mg/dL — ABNORMAL HIGH (ref 70–99)
Glucose-Capillary: 225 mg/dL — ABNORMAL HIGH (ref 70–99)
Glucose-Capillary: 203 mg/dL — ABNORMAL HIGH (ref 70–99)
Glucose-Capillary: 97 mg/dL (ref 70–99)

## 2020-07-25 LAB — PROCALCITONIN: Procalcitonin: 0.14 ng/mL

## 2020-07-25 LAB — MAGNESIUM: Magnesium: 2 mg/dL (ref 1.7–2.4)

## 2020-07-25 LAB — PHOSPHORUS: Phosphorus: 3.2 mg/dL (ref 2.5–4.6)

## 2020-07-25 LAB — BRAIN NATRIURETIC PEPTIDE: B Natriuretic Peptide: 152.1 pg/mL — ABNORMAL HIGH (ref 0.0–100.0)

## 2020-07-25 MED ORDER — FUROSEMIDE 40 MG PO TABS
40.0000 mg | ORAL_TABLET | Freq: Every day | ORAL | Status: DC
  Administered 2020-07-25 – 2020-07-26 (×2): 40 mg via ORAL
  Filled 2020-07-25 (×2): qty 1

## 2020-07-25 NOTE — Progress Notes (Signed)
PROGRESS NOTE    Crystal Ashley  VZD:[id number] DOB: 1946/05/18 DOA: 07/20/2020 PCP: [name], PA     Brief Narrative:  73 y.o. female PMHx Permanent A. fib on Xarelto, Chronic Diastolic CHF , Hypothyroidism, GERD, DM type II type controlled with hyperglycemia , obesity, HLD, bed-bound with pressure ulcer. Presented to [facility] ED with complaints of gradually worsening shortness of breath of 2 days duration.  Associated with orthopnea and hypoxia.  She has been compliant with her home diuretic.  EMS was activated, found to be hypoxic with O2 saturation in the 80's on room air, improved on 2L [place].  She denies chest pain, fevers, cough, or GI symptoms.  Volume overload on exam, imaging consistent with pulmonary edema, bilateral pleural effusions, and concern for acute on chronic diastolic CHF.  Due to leukocytosis, EDP started Rocephin for possible CAP. TRH, hospitalist team, asked to admit. In the ED, Lab studies remarkable for WBC 17.7K, hemoglobin 7.1K, neutrophil count 12.9, platelet count 503K.  Patient admitted for further management    Subjective: Denies any new complaints, denies any worsening shortness of breath, denies any chest pain, abdominal pain, nausea/vomiting, fever/chills.   Assessment & Plan:   Active Problems:   Atrial fibrillation with RVR (HCC)   Acute respiratory failure with hypoxia (HCC)   Bilateral pleural effusion   Pulmonary edema   Acute on chronic diastolic CHF (congestive heart failure) (HCC)   Chronic blood loss anemia   Controlled type 2 diabetes mellitus with hyperglycemia (HCC)   Hypothyroidism, adult   Physical deconditioning   Acute respiratory failure with hypoxia 2/2 pulmonary edema/acute on chronic diastolic HF, bilateral pleural effusion Multifactorial acute on chronic CHF, A. fib RVR, pleural effusion Presented with O2 saturation in 80s on room air improved on 2 L O2 CTA chest negative PE Chest x-ray consistent with pulmonary edema and  bilateral pleural effusions Echocardiogram done on 07/22/2020 showed EF of 60 to 65%, unable to evaluate for regional wall motion, left ventricular diastolic function could not be evaluated Cardiology on board, continue Lasix, Aldactone Strict I's and O's, daily weight  Paroxysmal A. fib RVR Currently rate controlled Continue amiodarone, diltiazem, Xarelto  Chronic Blood Loss Anemia Anemia of chronic disease S/p 1 unit of PRBC on 12/6 Anemia panel WNL Fecal occult blood negative Daily CBC  Leukocytosis ?HCAP Procalcitonin 0.35--> 0.29 UA unremarkable, UC with multiple species, BC x2 NGTD Repeat chest x-ray on 07/23/2020 showed patchy opacity in the left perihilar region concerning for infiltrate/pneumonia Continue cefepime  DM type II controlled with hyperglycemia 10/10 hemoglobin A1c = 6.1  Sensitive SSI  Hypothyroidism Continue Synthroid 75 mcg daily   GERD Stable Resume PPI  Physical deconditioning PT OT to assess Fall precautions  Bed-bound with pressure ulcers, POA Wound care specialist consult Local wound care  Obesity Lifestyle modification advised      DVT prophylaxis: Xarelto Code Status: DNR Family Communication: None at bedside  Status is: Inpatient    Dispo: The patient is from: Home              Anticipated d/c is to: Home              Anticipated d/c date is: in 1 day              Patient currently unstable      Consultants:  Cardiology  Procedures/Significant Events:    Cultures   Antimicrobials: Anti-infectives (From admission, onward)   Start     Ordered Stop  07/22/20 0800  cefTRIAXone (ROCEPHIN) 1 g in sodium chloride 0.9 % 100 mL IVPB        07/21/20 0613     07/21/20 0315  cefTRIAXone (ROCEPHIN) 1 g in sodium chloride 0.9 % 100 mL IVPB        07/21/20 0309 07/21/20 0606     . ceFEPime (MAXIPIME) IV 2 g (07/25/20 0959)     Objective: Vitals:   07/25/20 0051 07/25/20 0500 07/25/20 0743 07/25/20 1125  BP:  116/66 113/70 115/80 103/61  Pulse: 68 79 83 93  Resp: 18 18 16 16   Temp: 98.3 F (36.8 C) 98.2 F (36.8 C) (!) 97.4 F (36.3 C) 98.3 F (36.8 C)  TempSrc: Oral Oral Oral Axillary  SpO2: 98% 99% 98% 92%  Weight:  84.4 kg    Height:        Intake/Output Summary (Last 24 hours) at 07/25/2020 1416 Last data filed at 07/25/2020 0500 Gross per 24 hour  Intake 710 ml  Output 2050 ml  Net -1340 ml   Filed Weights   07/23/20 0500 07/24/20 0453 07/25/20 0500  Weight: 84.4 kg 84.4 kg 84.4 kg    Examination:  General: NAD   Cardiovascular: S1, S2 present  Respiratory:  Diminished breath sounds bilaterally  Abdomen: Soft, nontender, nondistended, bowel sounds present  Musculoskeletal: 1+ bilateral pedal edema noted  Skin: Normal  Psychiatry: Normal mood    CBC: Recent Labs  Lab 07/21/20 0018 07/21/20 0455 07/21/20 1600 07/22/20 0439 07/23/20 0640 07/24/20 0507 07/25/20 0500  WBC 17.7*  --  11.1* 10.5 10.7* 11.0* 11.2*  NEUTROABS 12.9*  --   --  7.0 6.9 6.3 6.1  HGB 7.1*   < > 7.9* 7.1* 8.5* 8.4* 9.0*  HCT 24.5*   < > 28.1* 24.0* 27.9* 29.4* 31.3*  MCV 92.8  --  91.2 90.2 87.2 90.2 90.5  PLT 503*  --  416* 325 347 360 387   < > = values in this interval not displayed.   Basic Metabolic Panel: Recent Labs  Lab 07/21/20 1600 07/22/20 0439 07/23/20 0640 07/24/20 0507 07/25/20 0500  NA 139 140 141 140 141  K 3.5 3.5 2.9* 3.7 4.3  CL 97* 98 96* 96* 95*  CO2 31 33* 33* 35* 35*  GLUCOSE 166* 162* 162* 143* 157*  BUN 11 10 12 11 14   CREATININE 0.89 0.85 0.80 0.76 0.96  CALCIUM 8.6* 8.9 8.6* 8.7* 9.2  MG 1.8 1.8 1.8 1.9 2.0  PHOS 3.2 3.6 3.3 3.0 3.2   GFR: Estimated Creatinine Clearance: 49.6 mL/min (by C-G formula based on SCr of 0.96 mg/dL). Liver Function Tests: Recent Labs  Lab 07/21/20 0018 07/22/20 0439 07/23/20 0640 07/24/20 0507 07/25/20 0500  AST 19 12* 16 16 15   ALT 8 10 13 14 13   ALKPHOS 73 55 69 84 77  BILITOT 1.2 1.0 0.7 1.0 0.9   PROT 6.6 6.1* 6.3* 6.3* 6.9  ALBUMIN 2.8* 2.9* 2.8* 2.7* 2.8*   No results for input(s): LIPASE, AMYLASE in the last 168 hours. No results for input(s): AMMONIA in the last 168 hours. Coagulation Profile: No results for input(s): INR, PROTIME in the last 168 hours. Cardiac Enzymes: No results for input(s): CKTOTAL, CKMB, CKMBINDEX, TROPONINI in the last 168 hours. BNP (last 3 results) No results for input(s): PROBNP in the last 8760 hours. HbA1C: No results for input(s): HGBA1C in the last 72 hours. CBG: Recent Labs  Lab 07/24/20 1139 07/24/20 1606 07/24/20 2153 07/25/20 0742 07/25/20 1122  GLUCAP 259* 181* 122* 158* 225*   Lipid Profile: No results for input(s): CHOL, HDL, LDLCALC, TRIG, CHOLHDL, LDLDIRECT in the last 72 hours. Thyroid Function Tests: No results for input(s): TSH, T4TOTAL, FREET4, T3FREE, THYROIDAB in the last 72 hours. Anemia Panel: No results for input(s): VITAMINB12, FOLATE, FERRITIN, TIBC, IRON, RETICCTPCT in the last 72 hours. Sepsis Labs: Recent Labs  Lab 07/21/20 0310 07/21/20 0542 07/21/20 0811 07/23/20 1520 07/24/20 0507 07/25/20 0500  PROCALCITON  --   --  0.10 0.35 0.29 0.14  LATICACIDVEN 1.0 1.0  --   --   --   --     Recent Results (from the past 240 hour(s))  Resp Panel by RT-PCR (Flu A&B, Covid) Nasopharyngeal Swab     Status: None   Collection Time: 07/21/20  1:36 AM   Specimen: Nasopharyngeal Swab; Nasopharyngeal(NP) swabs in vial transport medium  Result Value Ref Range Status   SARS Coronavirus 2 by RT PCR NEGATIVE NEGATIVE Final    Comment: (NOTE) SARS-CoV-2 target nucleic acids are NOT DETECTED.  The SARS-CoV-2 RNA is generally detectable in upper respiratory specimens during the acute phase of infection. The lowest concentration of SARS-CoV-2 viral copies this assay can detect is 138 copies/mL. A negative result does not preclude SARS-Cov-2 infection and should not be used as the sole basis for treatment or other  patient management decisions. A negative result may occur with  improper specimen collection/handling, submission of specimen other than nasopharyngeal swab, presence of viral mutation(s) within the areas targeted by this assay, and inadequate number of viral copies(<138 copies/mL). A negative result must be combined with clinical observations, patient history, and epidemiological information. The expected result is Negative.  Fact Sheet for Patients:  EntrepreneurPulse.com.au  Fact Sheet for Healthcare Providers:  IncredibleEmployment.be  This test is no t yet approved or cleared by the [place] FDA and  has been authorized for detection and/or diagnosis of SARS-CoV-2 by FDA under an Emergency Use Authorization (EUA). This EUA will remain  in effect (meaning this test can be used) for the duration of the COVID-19 declaration under Section 564(b)(1) of the Act, 21 U.S.C.section 360bbb-3(b)(1), unless the authorization is terminated  or revoked sooner.       Influenza A by PCR NEGATIVE NEGATIVE Final   Influenza B by PCR NEGATIVE NEGATIVE Final    Comment: (NOTE) The Xpert Xpress SARS-CoV-2/FLU/RSV plus assay is intended as an aid in the diagnosis of influenza from Nasopharyngeal swab specimens and should not be used as a sole basis for treatment. Nasal washings and aspirates are unacceptable for Xpert Xpress SARS-CoV-2/FLU/RSV testing.  Fact Sheet for Patients: EntrepreneurPulse.com.au  Fact Sheet for Healthcare Providers: IncredibleEmployment.be  This test is not yet approved or cleared by the [place] FDA and has been authorized for detection and/or diagnosis of SARS-CoV-2 by FDA under an Emergency Use Authorization (EUA). This EUA will remain in effect (meaning this test can be used) for the duration of the COVID-19 declaration under Section 564(b)(1) of the Act, 21 U.S.C. section  360bbb-3(b)(1), unless the authorization is terminated or revoked.  Performed at [facility], [place] [address] [place], [place] [address]   Blood culture (routine x 2)     Status: None (Preliminary result)   Collection Time: 07/21/20  5:37 AM   Specimen: BLOOD  Result Value Ref Range Status   Specimen Description BLOOD RIGHT ARM  Final   Special Requests   Final    BOTTLES DRAWN AEROBIC AND ANAEROBIC Blood Culture  adequate volume   Culture   Final    NO GROWTH 4 DAYS Performed at [facility], [place] [address], [place], [place] [address]    Report Status PENDING  Incomplete  Culture, Urine     Status: Abnormal   Collection Time: 07/21/20  6:50 AM   Specimen: Urine, Random  Result Value Ref Range Status   Specimen Description URINE, RANDOM  Final   Special Requests   Final    NONE Performed at [facility], [place] [address] [place], [place] [address]    Culture MULTIPLE SPECIES PRESENT, SUGGEST RECOLLECTION (A)  Final   Report Status 07/22/2020 FINAL  Final  Blood culture (routine x 2)     Status: None (Preliminary result)   Collection Time: 07/21/20  9:36 AM   Specimen: BLOOD RIGHT HAND  Result Value Ref Range Status   Specimen Description BLOOD RIGHT HAND  Final   Special Requests   Final    BOTTLES DRAWN AEROBIC ONLY Blood Culture adequate volume   Culture   Final    NO GROWTH 4 DAYS Performed at [facility], [place] [address], [place], [place] [address]    Report Status PENDING  Incomplete         Radiology Studies: No results found.      Scheduled Meds: . albuterol  5 mg Nebulization Once  . amiodarone  200 mg Oral BID  . atorvastatin  10 mg Oral Daily  . Chlorhexidine Gluconate Cloth  6 each Topical Daily  . diltiazem  120 mg Oral Daily  . furosemide  40 mg Oral Daily  . insulin aspart  0-5 Units Subcutaneous QHS  . insulin aspart  0-9 Units Subcutaneous TID WC  . levothyroxine  75 mcg Oral QAC breakfast  . metoprolol tartrate   12.5 mg Oral BID  . pantoprazole  40 mg Oral BID  . rivaroxaban  20 mg Oral Q supper  . sodium chloride flush  10-40 mL Intracatheter Q12H  . spironolactone  25 mg Oral Daily  . Travoprost (BAK Free)  1 drop Both Eyes QHS  . vitamin B-12  500 mcg Oral Daily   Continuous Infusions: . ceFEPime (MAXIPIME) IV 2 g (07/25/20 0959)     LOS: 4 days    Alma Friendly, MD [facility]  If 7PM-7AM, please contact night-coverage 07/25/2020, 2:16 PM

## 2020-07-25 NOTE — Progress Notes (Signed)
Physical Therapy Treatment Patient Details Name: Crystal Ashley MRN: [id number] DOB: [birth year] Today's Date: 07/25/2020    History of Present Illness Pt is a 74 year old woman admitted on 07/21/20 with SOB with hypoxia x 2 days. + volume overload, B pleural effusions. 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    PT Comments    Pt was able to participate in transfers with mod A x 2, encouragement/cues, and increased time.  Participated in multiple exercises with rest breaks and cues for correct form.  Pt is from home and largely w/c and bed bound with use of hoyer, but was working on sit to stands per family.  Will continue to benefit from therapy to progress as able and to maintain current mobility in order to reduce caregiver burden.    Follow Up Recommendations  Home health PT;Supervision/Assistance - 24 hour     Equipment Recommendations  None recommended by PT (has all DME including hoyer)    Recommendations for Other Services       Precautions / Restrictions Precautions Precautions: Fall    Mobility  Bed Mobility Overal bed mobility: Needs Assistance Bed Mobility: Supine to Sit   Sidelying to sit: Mod assist;+2 for physical assistance       General bed mobility comments: Increased time and cues with HOB elevated.  Required assist for legs, trunk, and to scoot forward  Transfers Overall transfer level: Needs assistance Equipment used: 2 person hand held assist Transfers: Sit to/from [employer] Transfers Sit to Stand: +2 physical assistance;Mod assist;From elevated surface Stand pivot transfers: Mod assist;+2 physical assistance       General transfer comment: with use of gait belt and bed pad under hips, pt stood with increased time, encouragement, and assist to lean forward; reports fear of faling  Ambulation/Gait                 Stairs              Wheelchair Mobility    Modified Rankin (Stroke Patients Only)       Balance Overall balance assessment: Needs assistance Sitting-balance support: Single extremity supported Sitting balance-Leahy Scale: Poor Sitting balance - Comments: Sat EOB for 5 mins with UE support and supervision   Standing balance support: Bilateral upper extremity supported Standing balance-Leahy Scale: Poor Standing balance comment: required mod A of 2                            Cognition Arousal/Alertness: Awake/alert Behavior During Therapy: WFL for tasks assessed/performed Overall Cognitive Status: History of cognitive impairments - at baseline                                 General Comments: daughter in law present - pt able to communicate in [place] but daughter in law present and at times interpreted      Exercises General Exercises - Upper Extremity Shoulder Flexion: AROM;Both;10 reps;Seated Elbow Flexion: AROM;10 reps;Both;Seated Elbow Extension: AROM;10 reps;Both;Seated General Exercises - Lower Extremity Ankle Circles/Pumps: AROM;20 reps;Seated;Both Long Arc Quad: AROM;Seated;Both (10x2) Hip Flexion/Marching: AAROM;Both;10 reps;Seated Other Exercises Other Exercises: Pulling self forward in chair x 8    General Comments General comments (skin integrity, edema, etc.): Pt on 1 L at arrival with sat 100%.  Tried RA at request of family  and pt maintained 95%, left on RA and notified RN.  HR and BP stable.   At evaluation, report was that pt was walking 10 steps with therapy, soon to be daughter in law present and reports pt was not able to take steps, was working on sit to stands and pivots but that they rely heavily on the [employer]. Also, report they were paying for private aide/therapy - no longer covered by insurance.      Pertinent Vitals/Pain Pain Assessment: Faces Faces Pain Scale: Hurts a little bit Pain Location: R arm at IV site Pain  Intervention(s): Repositioned;Monitored during session    Home Living                      Prior Function            PT Goals (current goals can now be found in the care plan section) Acute Rehab PT Goals Patient Stated Goal: return home PT Goal Formulation: With patient/family Time For Goal Achievement: 08/05/20 Potential to Achieve Goals: Fair Progress towards PT goals: Progressing toward goals    Frequency    Min 2X/week      PT Plan Current plan remains appropriate    Co-evaluation              AM-PAC PT "6 Clicks" Mobility   Outcome Measure  Help needed turning from your back to your side while in a flat bed without using bedrails?: A Lot Help needed moving from lying on your back to sitting on the side of a flat bed without using bedrails?: Total Help needed moving to and from a bed to a chair (including a wheelchair)?: Total Help needed standing up from a chair using your arms (e.g., wheelchair or bedside chair)?: A Lot Help needed to walk in hospital room?: Total Help needed climbing 3-5 steps with a railing? : Total 6 Click Score: 8    End of Session Equipment Utilized During Treatment: Gait belt Activity Tolerance: Patient tolerated treatment well Patient left: in chair;with call bell/phone within reach;with chair alarm set Nurse Communication: Mobility status;Need for lift equipment (hoyer pad in place) PT Visit Diagnosis: Other abnormalities of gait and mobility (R26.89);Muscle weakness (generalized) (M62.81);History of falling (Z91.81)     Time: 5361-4431 PT Time Calculation (min) (ACUTE ONLY): 31 min  Charges:  $Therapeutic Exercise: 8-22 mins $Therapeutic Activity: 8-22 mins                     Abran Richard, PT Acute Rehab Services Pager [phone] [facility] 07/25/2020, 11:27 AM

## 2020-07-26 LAB — BRAIN NATRIURETIC PEPTIDE: B Natriuretic Peptide: 215.3 pg/mL — ABNORMAL HIGH (ref 0.0–100.0)

## 2020-07-26 LAB — COMPREHENSIVE METABOLIC PANEL
ALT: 14 U/L (ref 0–44)
AST: 13 U/L — ABNORMAL LOW (ref 15–41)
Albumin: 2.5 g/dL — ABNORMAL LOW (ref 3.5–5.0)
Alkaline Phosphatase: 71 U/L (ref 38–126)
Anion gap: 10 (ref 5–15)
BUN: 14 mg/dL (ref 8–23)
CO2: 33 mmol/L — ABNORMAL HIGH (ref 22–32)
Calcium: 8.7 mg/dL — ABNORMAL LOW (ref 8.9–10.3)
Chloride: 94 mmol/L — ABNORMAL LOW (ref 98–111)
Creatinine, Ser: 0.89 mg/dL (ref 0.44–1.00)
GFR, Estimated: >60 mL/min (ref >60)
Glucose, Bld: 146 mg/dL — ABNORMAL HIGH (ref 70–99)
Potassium: 3.9 mmol/L (ref 3.5–5.1)
Sodium: 137 mmol/L (ref 135–145)
Total Bilirubin: 0.7 mg/dL (ref 0.3–1.2)
Total Protein: 6.1 g/dL — ABNORMAL LOW (ref 6.5–8.1)

## 2020-07-26 LAB — GLUCOSE, CAPILLARY
Glucose-Capillary: 123 mg/dL — ABNORMAL HIGH (ref 70–99)
Glucose-Capillary: 186 mg/dL — ABNORMAL HIGH (ref 70–99)
Glucose-Capillary: 229 mg/dL — ABNORMAL HIGH (ref 70–99)
Glucose-Capillary: 166 mg/dL — ABNORMAL HIGH (ref 70–99)

## 2020-07-26 LAB — CULTURE, BLOOD (ROUTINE X 2)
Culture: NO GROWTH
Culture: NO GROWTH
Special Requests: ADEQUATE
Special Requests: ADEQUATE

## 2020-07-26 LAB — MAGNESIUM: Magnesium: 1.9 mg/dL (ref 1.7–2.4)

## 2020-07-26 LAB — PHOSPHORUS: Phosphorus: 3.2 mg/dL (ref 2.5–4.6)

## 2020-07-26 MED ORDER — DILTIAZEM HCL ER COATED BEADS 120 MG PO CP24: 120.0000 mg | ORAL_CAPSULE | Freq: Every day | ORAL | 0 refills | Status: DC

## 2020-07-26 MED ORDER — SPIRONOLACTONE 25 MG PO TABS: 25.0000 mg | ORAL_TABLET | Freq: Every day | ORAL | 0 refills | Status: DC

## 2020-07-26 MED ORDER — CEFDINIR 300 MG PO CAPS: 300.0000 mg | ORAL_CAPSULE | Freq: Two times a day (BID) | ORAL | 0 refills | Status: AC

## 2020-07-26 MED ORDER — METOPROLOL SUCCINATE ER 25 MG PO TB24: 25.0000 mg | ORAL_TABLET | Freq: Every day | ORAL | 0 refills | Status: DC

## 2020-07-26 MED ORDER — AMIODARONE HCL 200 MG PO TABS: 200.0000 mg | ORAL_TABLET | Freq: Every day | ORAL | 0 refills | Status: DC

## 2020-07-26 NOTE — Progress Notes (Signed)
RA TL PICC removed per protocol per MD order for pt's discharge. Manual pressure applied for 5 mins. No bleeding or swelling noted. Instructed patient to remain in bed for thirty mins, until 1240. Educated patient and pt's son about S/S of infection and when to call MD; no heavy lifting or pressure on right side for 24 hours; keep dressing dry and intact for 24 hours. Pt's son verbalized comprehension.

## 2020-07-26 NOTE — Care Management (Signed)
2583 07-26-20 Case Manager called [facility] again for ETA- was told that they lost some drivers and the patient may be at least another 2 hours before being picked up. Case Manager notified son and he will go and purchase medications. Staff RN please call son when Crystal Ashley arrives so he will know when she leaves the hospital. Adelfa Koh, Ocie Cornfield, RN,BSN Case Manager

## 2020-07-26 NOTE — TOC Initial Note (Signed)
Transition of Care [facility]) - Initial/Assessment Note    Patient Details  Name: Crystal Ashley MRN: [id number] Date of Birth: [birth year]  Transition of Care [facility]) CM/SW Contact:    Bethena Roys, RN Phone Number: 07/26/2020, 12:39 PM  Clinical Narrative: Risk for readmission assessment completed. Prior to arrival patient was from home with the support of son. Patient will transition home via PTAR today. PTAR scheduled for 1245 pm. Pt was previously active with [facility] and services have been resumed. [facility] is aware that patient will transition home today. No further needs from Case Manager at this time.            Expected Discharge Plan: [place] Barriers to Discharge: No Barriers Identified   Patient Goals and CMS Choice Patient states their goals for this hospitalization and ongoing recovery are:: to return home   Choice offered to / list presented to : NA (Currently active with [facility])  Expected Discharge Plan and Services Expected Discharge Plan: [place] In-house Referral: NA Discharge Planning Services: CM Consult Post Acute Care Choice: [place] arrangements for the past 2 months: Single Family Home Expected Discharge Date: 07/26/20               DME Arranged: N/A DME Agency: NA       HH Arranged: PT,RN,Disease Management,OT [place] Agency: [place] (now [facility]) Date [place]: 07/26/20 Time HH Agency Contacted: 1100 Representative spoke with at [place]: Clarene Critchley  Prior Living Arrangements/Services Living arrangements for the past 2 months: [place] with:: Adult Children Patient language and need for interpreter reviewed:: Yes Do you feel safe going back to the place where you live?: Yes      Need for Family Participation in Patient Care: Yes (Comment) Care giver support system in place?: Yes (comment) Current home services: DME (Pt has  rolling walker and hospital bed in the home.)    Activities of Daily Living Home Assistive Devices/Equipment: Bedside commode/3-in-1,Hospital bed,Wheelchair (sit to stand) ADL Screening (condition at time of admission) Patient's cognitive ability adequate to safely complete daily activities?: Yes Is the patient deaf or have difficulty hearing?: No Does the patient have difficulty seeing, even when wearing glasses/contacts?: No Does the patient have difficulty concentrating, remembering, or making decisions?: Yes Patient able to express need for assistance with ADLs?: Yes Does the patient have difficulty dressing or bathing?: Yes Independently performs ADLs?: Yes (appropriate for developmental age) Does the patient have difficulty walking or climbing stairs?: Yes (unable to ) Weakness of Legs: Both Weakness of Arms/Hands: None  Permission Sought/Granted Permission sought to share information with : Family IT sales professional granted to share info w AGENCY: [facility]        Emotional Assessment Appearance:: Appears stated age Attitude/Demeanor/Rapport: Engaged Affect (typically observed): Appropriate Orientation: : Oriented to Situation,Oriented to Place,Oriented to Self,Oriented to  Time Alcohol / Substance Use: Not Applicable Psych Involvement: No (comment)  Admission diagnosis:  Acute on chronic diastolic congestive heart failure ([place]) [I50.33] Acute respiratory failure with hypoxia ([place]) [J96.01] Acute hypoxemic respiratory failure ([place]) [J96.01] Patient Active Problem List   Diagnosis Date Noted  . Bilateral pleural effusion 07/21/2020  . Pulmonary edema 07/21/2020  . Acute on chronic diastolic CHF (congestive heart failure) ([place]) 07/21/2020  . Chronic blood loss anemia 07/21/2020  . Controlled type 2 diabetes mellitus with hyperglycemia (  [place]) 07/21/2020  . Hypothyroidism, adult 07/21/2020  . Physical  deconditioning 07/21/2020  . Pressure injury of skin 05/27/2020  . SOB (shortness of breath) 05/26/2020  . PNA (pneumonia) 05/22/2020  . Elective surgery   . Fusion of spine, thoracolumbar region 02/26/2020  . Acute blood loss anemia   . PAF (paroxysmal atrial fibrillation) ([place])   . Controlled type 2 diabetes mellitus with hyperglycemia, without long-term current use of insulin ([place])   . Fracture of vertebra due to osteoporosis ([place])   . Acute midline thoracic back pain   . Multiple traumatic injuries 02/12/2020  . Closed fracture of multiple pubic rami, right, initial encounter ([place]) 02/07/2020  . Atrial fibrillation, chronic ([place]) 02/07/2020  . Chronic combined systolic (congestive) and diastolic (congestive) heart failure ([place])   . Acute respiratory failure with hypoxia ([place]) 10/18/2018  . Declining mobility 09/23/2018  . B12 deficiency 09/23/2018  . Morbid obesity with BMI of 40.0-44.9, adult ([place]) 09/23/2018  . Anemia 09/23/2018  . History of cardioversion 04/26/2018  . Type 2 diabetes mellitus without complication, without long-term current use of insulin ([place]) 03/15/2018  . Atrial fibrillation with RVR ([place]) 03/15/2018  . Morbid obesity ([place]) 01/04/2017  . Hyperlipidemia associated with type 2 diabetes mellitus ([place]) 01/04/2017  . Vitamin D deficiency 12/15/2016  . Glaucoma   . Hypertension associated with diabetes ([place]) 04/19/2014  . Hypothyroidism 04/19/2014  . History of endometrial cancer 03/30/2014   PCP:  [name], PA Pharmacy:   [facility], [place] - [place] [address] Phone: [phone] Fax: [facility] - [place], [address] [place] [address] Phone: [phone] Fax: [phone]  Readmission Risk Interventions Readmission Risk Prevention Plan 07/26/2020 05/29/2020 02/08/2020  Transportation Screening Complete Complete  Complete  PCP or Specialist Appt within 5-7 Days - - Complete  Home Care Screening - - Complete  Medication Review (RN CM) - - Complete  Medication Review ([place]) Complete Complete -  PCP or Specialist appointment within 3-5 days of discharge Complete Complete -  [place] or Home Care Consult Complete Complete -  SW Recovery Care/Counseling Consult Complete Complete -  Palliative Care Screening Not Applicable Not Applicable -  [place] Not Applicable Complete -  Some recent data might be hidden

## 2020-07-26 NOTE — Discharge Instructions (Signed)

## 2020-07-26 NOTE — Discharge Summary (Signed)
Discharge Summary  Crystal Ashley AOZ:[id number] DOB: 14-Apr-1946  PCP: [name], PA  Admit date: 07/20/2020 Discharge date: 07/26/2020  Time spent: 40 mins  Recommendations for Outpatient Follow-up:  1. PCP in 1 week 2. Cardiology as scheduled    Discharge Diagnoses:  Active Hospital Problems   Diagnosis Date Noted  . Bilateral pleural effusion 07/21/2020  . Pulmonary edema 07/21/2020  . Acute on chronic diastolic CHF (congestive heart failure) ([place]) 07/21/2020  . Chronic blood loss anemia 07/21/2020  . Controlled type 2 diabetes mellitus with hyperglycemia ([place]) 07/21/2020  . Hypothyroidism, adult 07/21/2020  . Physical deconditioning 07/21/2020  . Acute respiratory failure with hypoxia ([place]) 10/18/2018  . Atrial fibrillation with RVR ([place]) 03/15/2018    Resolved Hospital Problems  No resolved problems to display.    Discharge Condition: Stable  Diet recommendation: Heart healthy, mod carb  Vitals:   07/26/20 0500 07/26/20 0808  BP: 120/60 120/81  Pulse: 85 84  Resp: 17 18  Temp: 97.6 F (36.4 C) 98.7 F (37.1 C)  SpO2: 99% 98%    History of present illness:  74 y.o.femalePMHx Permanent A. fib on Xarelto, Chronic Diastolic CHF , Hypothyroidism, GERD, DM type II type controlled with hyperglycemia , obesity, HLD,bed-bound withpressure ulcer. Presented to [facility] ED with complaints of gradually worsening shortness of breath of 2 days duration. Associated with orthopnea and hypoxia. She has been compliant with her home diuretic.EMS was activated, found to be hypoxic with O2 saturation in the 80'son room air, improved on 2L [place].She denies chest pain, fevers, cough, or GI symptoms.Volume overload on exam, imaging consistent with pulmonary edema,bilateral pleural effusions, and concern for acute on chronic diastolic CHF. Due to leukocytosis, EDP started Rocephin for possible CAP.TRH, hospitalist team,asked to admit. In the ED, Lab studies remarkable  for WBC 17.7K, hemoglobin 7.1K, neutrophil count12.9, platelet count 503K.  Patient admitted for further management.    Today, patient denies any new complaints, denies any worsening shortness of breath, chest pain, abdominal pain, nausea/vomiting, fever/chills.  Excited to be discharged home with home health.  Discussed extensively with son on 07/25/2020 about discharge plans, all questions answered.   Hospital Course:  Active Problems:   Atrial fibrillation with RVR (HCC)   Acute respiratory failure with hypoxia (HCC)   Bilateral pleural effusion   Pulmonary edema   Acute on chronic diastolic CHF (congestive heart failure) (HCC)   Chronic blood loss anemia   Controlled type 2 diabetes mellitus with hyperglycemia (HCC)   Hypothyroidism, adult   Physical deconditioning   Acute respiratory failure with hypoxia 2/2 pulmonary edema/acute on chronic diastolic HF, bilateral pleural effusion Multifactorial acute on chronic CHF, A. fib RVR, pleural effusion Presented with O2 saturation in 80s on room air, currently saturating 99% on room air CTA chest negative PE Chest x-ray consistent with pulmonary edema and bilateral pleural effusions Echocardiogram done on 07/22/2020 showed EF of 60 to 65%, unable to evaluate for regional wall motion, left ventricular diastolic function could not be evaluated Cardiology salted continue Lasix, Aldactone, metoprolol Follow-up with PCP, cardiology  Paroxysmal A. fib RVR Currently rate controlled Continue amiodarone, reduced dose of diltiazem, Xarelto  Chronic Blood Loss Anemia Anemia of chronic disease S/p 1 unit of PRBC on 12/6 Anemia panel WNL Fecal occult blood negative Follow-up with PCP  Leukocytosis ?HCAP Procalcitonin 0.35--> 0.29 UA unremarkable, UC with multiple species, BC x2 NGTD Repeat chest x-ray on 07/23/2020 showed patchy opacity in the left perihilar region concerning for infiltrate/pneumonia S/p cefepime--> discharged on  p.o.  cefdinir to complete 7 days  DM type II controlled with hyperglycemia 10/10 hemoglobin A1c = 6.1  Continue home regimen  Hypothyroidism Continue Synthroid   GERD Stable Continue PPI  Physical deconditioning PT OT recommend home health Fall precautions  Bed-bound withpressure ulcers, POA Continue local wound care  Obesity Lifestyle modification advised       Malnutrition Type:      Malnutrition Characteristics:      Nutrition Interventions:      Estimated body mass index is 36.33 kg/m as calculated from the following:   Height as of this encounter: 5' (1.524 m).   Weight as of this encounter: 84.4 kg.    Procedures:  None  Consultations:  Cardiology  Discharge Exam: BP 120/81 (BP Location: Left Arm)   Pulse 84   Temp 98.7 F (37.1 C) (Oral)   Resp 18   Ht 5' (1.524 m)   Wt 84.4 kg   SpO2 98%   BMI 36.33 kg/m   General: NAD Cardiovascular: S1, S2 present Respiratory: CTA B    Discharge Instructions You were cared for by a hospitalist during your hospital stay. If you have any questions about your discharge medications or the care you received while you were in the hospital after you are discharged, you can call the unit and asked to speak with the hospitalist on call if the hospitalist that took care of you is not available. Once you are discharged, your primary care physician will handle any further medical issues. Please note that NO REFILLS for any discharge medications will be authorized once you are discharged, as it is imperative that you return to your primary care physician (or establish a relationship with a primary care physician if you do not have one) for your aftercare needs so that they can reassess your need for medications and monitor your lab values.  Discharge Instructions    Diet - low sodium heart healthy   Complete by: As directed    Discharge wound care:   Complete by: As directed    Place a narrow strip of  Aquacel Kellie Simmering [phone]) into and over the fissure in the gluteal fold at the apex. Cover with a foam dressing.  Change the strip of Aquacel daily.  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      Medication List    STOP taking these medications   methocarbamol 500 MG tablet Commonly known as: ROBAXIN   polyethylene glycol 17 g packet Commonly known as: MIRALAX / GLYCOLAX     TAKE these medications   amiodarone 200 MG tablet Commonly known as: PACERONE Take 1 tablet (200 mg total) by mouth daily.   atorvastatin 10 MG tablet Commonly known as: LIPITOR Take 1 tablet (10 mg total) by mouth daily.   Biofreeze 4 % Gel Generic drug: Menthol (Topical Analgesic) Apply 1 application topically daily as needed (pain). Apply to right side rib cage and lateral thigh   cefdinir 300 MG capsule Commonly known as: OMNICEF Take 1 capsule (300 mg total) by mouth 2 (two) times daily for 4 days. Start taking on: July 27, 2020   CITRACAL +D3 PO Take 1 tablet by mouth daily.   diltiazem 120 MG 24  hr capsule Commonly known as: CARDIZEM CD Take 1 capsule (120 mg total) by mouth daily. What changed:   medication strength  how much to take   dorzolamide-timolol 22.3-6.8 MG/ML ophthalmic solution Commonly known as: COSOPT Place 1 drop into both eyes 2 (two) times daily.   furosemide 40 MG tablet Commonly known as: LASIX Take 1 tablet (40 mg total) by mouth daily.   Lancet Device with Ejector Misc Use to check blood sugar daily and prn   Lancets 33G Misc Use to check blood sugar daily and prn   levothyroxine 75 MCG tablet Commonly known as: SYNTHROID Take 1 tablet (75 mcg total) by mouth daily before breakfast.   metFORMIN 1000 MG tablet Commonly known as:  GLUCOPHAGE Take 1 tablet (1,000 mg total) by mouth daily with breakfast.   metoprolol succinate 25 MG 24 hr tablet Commonly known as: Toprol XL Take 1 tablet (25 mg total) by mouth daily.   pantoprazole 40 MG tablet Commonly known as: PROTONIX Take 1 tablet (40 mg total) by mouth daily.   potassium chloride 10 MEQ CR capsule Commonly known as: MICRO-K Take 10 mEq by mouth daily.   rivaroxaban 20 MG Tabs tablet Commonly known as: XARELTO Take 1 tablet (20 mg total) by mouth daily with supper. What changed: when to take this   sennosides 8.8 MG/5ML syrup Commonly known as: SENOKOT Take 10 mLs by mouth daily as needed for mild constipation.   spironolactone 25 MG tablet Commonly known as: ALDACTONE Take 1 tablet (25 mg total) by mouth daily. Start taking on: July 27, 2020   travoprost (benzalkonium) 0.004 % ophthalmic solution Commonly known as: TRAVATAN Place 1 drop into both eyes at bedtime.   vitamin B-12 500 MCG tablet Commonly known as: CYANOCOBALAMIN Take 500 mcg by mouth daily.            Discharge Care Instructions  (From admission, onward)         Start     Ordered   07/26/20 0000  Discharge wound care:       Comments: Place a narrow strip of Aquacel Kellie Simmering [phone]) into and over the fissure in the gluteal fold at the apex. Cover with a foam dressing.  Change the strip of Aquacel daily.  The foam can be used up to 5 days   07/26/20 0952         Allergies  Allergen Reactions  . 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    Follow-up Information    [name], [place]. Schedule an appointment as soon as possible for a visit in 1 week(s).   Specialty: Physician Assistant Contact information: [place] [address] [phone]        Adrian Prows, MD Follow up.   Specialty: Cardiology Why: Follow up as scheduled Contact  information: [place] [address] [phone]                The results of significant diagnostics from this hospitalization (including imaging, microbiology, ancillary and laboratory) are listed below for reference.    Significant Diagnostic Studies: DG Chest 2 View  Result Date: 07/23/2020 CLINICAL DATA:  Pleural effusion EXAM: CHEST - 2 VIEW COMPARISON:  07/22/2020 FINDINGS: No visible pleural effusion or pneumothorax. Patchy opacity in the left perihilar region. Right lung clear. Heart is mildly enlarged. Right PICC line is in place with the tip at the  cavoatrial junction. IMPRESSION: Mild cardiomegaly. Patchy opacity in the left perihilar region concerning for infiltrate/pneumonia. No effusion visualized. Electronically Signed   By: Rolm Baptise M.D.   On: 07/23/2020 12:27   DG Chest 2 View  Result Date: 07/21/2020 CLINICAL DATA:  Shortness of breath EXAM: CHEST - 2 VIEW COMPARISON:  None. FINDINGS: The heart size and mediastinal contours are within normal limits. Both lungs are clear. Posterior spinal fixation hardware is seen. IMPRESSION: No active cardiopulmonary disease. Electronically Signed   By: Prudencio Pair M.D.   On: 07/21/2020 00:49   CT Angio Chest PE W and/or Wo Contrast  Result Date: 07/21/2020 CLINICAL DATA:  Shortness of breath, lower extremity edema. EXAM: CT ANGIOGRAPHY CHEST WITH CONTRAST TECHNIQUE: Multidetector CT imaging of the chest was performed using the standard protocol during bolus administration of intravenous contrast. Multiplanar CT image reconstructions and MIPs were obtained to evaluate the vascular anatomy. CONTRAST:  117mL OMNIPAQUE IOHEXOL 350 MG/ML SOLN COMPARISON:  Chest CT dated 05/26/2020 FINDINGS: Cardiovascular: Some of the most peripheral segmental and subsegmental pulmonary artery branches are difficult to definitively characterize due to patient breathing motion artifact, however, there is no pulmonary embolism  identified within the main, lobar or central segmental pulmonary artery branches bilaterally. 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 Mild aortic atherosclerosis. No aortic dissection. No pericardial effusion. Again noted is prominence of the main pulmonary artery trunk, measuring 3.6 cm diameter. Mediastinum/Nodes: Stable mildly prominent lymph node within the aortopulmonary window region, measuring 1.3 cm diameter, stable compared to the earlier chest CT. Also, stable mildly prominent lymph nodes within the anterior mediastinum and RIGHT lower paratracheal space, largest again measuring 1 cm short axis dimension. No new lymph nodes appreciated. Esophagus is unremarkable.  Trachea is unremarkable. Lungs/Pleura: Mosaic pattern throughout both lungs, indicating some degree of air trapping. Additional hazy ground-glass opacities scattered throughout both lungs. No pleural effusion or w pneumothorax. Upper Abdomen: Stable mass within the RIGHT adrenal gland, measuring 1.3 cm, with CT density measurement compatible with benign adenoma. Limited images of the upper abdomen are otherwise unremarkable. Musculoskeletal: No acute or suspicious osseous finding. Fixation hardware appears intact and appropriately positioned within the lower thoracic spine. Review of the MIP images confirms the above findings. IMPRESSION: 1. Mosaic pattern throughout both lungs, indicating some degree of air trapping, and additional hazy ground-glass opacities scattered throughout both lungs. Differential for the ground-glass opacities includes atypical pneumonias such as viral or fungal, interstitial pneumonias, edema related to volume overload/CHF, chronic interstitial diseases, hypersensitivity pneumonitis, and respiratory bronchiolitis. Early COVID-19 pneumonia can have this appearance. Favor pulmonary edema related to volume overload/CHF. 2. No pulmonary embolism seen, with mild study  limitations detailed above. 3. Stable prominence of the main pulmonary artery trunk, measuring 3.6 cm diameter, suggesting chronic pulmonary artery hypertension. 4. Stable mildly prominent lymph nodes within the mediastinum, the largest again measuring 1.3 cm short axis dimension. Consider additional follow-up chest CT in 12 months to ensure 2 year stability. 5. Benign RIGHT adrenal adenoma. Aortic Atherosclerosis (ICD10-I70.0). Electronically Signed   By: Franki Cabot M.D.   On: 07/21/2020 04:19   DG CHEST PORT 1 VIEW  Result Date: 07/22/2020 CLINICAL DATA:  Status post PICC line placement. EXAM: PORTABLE CHEST 1 VIEW COMPARISON:  July 21, 2020 FINDINGS: A right-sided PICC line is seen with its distal tip noted just beyond the junction of the superior vena cava and right atrium. Mild diffuse hazy areas of atelectasis and/or infiltrate are  seen throughout both lungs. There is no evidence of a pleural effusion or pneumothorax. The cardiac silhouette is moderately enlarged. Bilateral pedicle screws are seen within the lower thoracic spine. IMPRESSION: Right-sided PICC line with its distal tip noted just beyond the junction of the superior vena cava and right atrium. Electronically Signed   By: Virgina Norfolk M.D.   On: 07/22/2020 19:33   ECHOCARDIOGRAM COMPLETE  Result Date: 07/22/2020    ECHOCARDIOGRAM REPORT   Patient Name:   RILY NICKEY Date of Exam: 07/22/2020 Medical Rec #:  [id number]          Height:       60.0 in Accession #:    [id number]         Weight:       186.5 lb Date of Birth:  [birth year]           BSA:          1.812 m Patient Age:    74 years           BP:           147/93 mmHg Patient Gender: F                  HR:           117 bpm. Exam Location:  Inpatient Procedure: 2D Echo, Color Doppler and Cardiac Doppler Indications:    Z36.64 Acute diastolic (congestive) heart failure  History:        Patient has prior history of Echocardiogram examinations, most                 recent  02/28/2020. CHF; Risk Factors:Hypertension, Diabetes and                 Dyslipidemia.  Sonographer:    Raquel Sarna Senior RDCS Referring Phys: [id number] [place]  Sonographer Comments: Technically difficult study due to poor echo windows. Technically difficult due to positioning, scanned upright due to dyspnea. IMPRESSIONS  1. Left ventricular ejection fraction, by estimation, is 60 to 65%. The left ventricle has normal function. Left ventricular endocardial border not optimally defined to evaluate regional wall motion. Left ventricular diastolic function could not be evaluated.  2. Right ventricular systolic function is normal. The right ventricular size is normal. There is mildly elevated pulmonary artery systolic pressure. The estimated right ventricular systolic pressure is 59.5 mmHg.  3. The mitral valve is grossly normal. No evidence of mitral valve regurgitation. No evidence of mitral stenosis.  4. The aortic valve is grossly normal. Aortic valve regurgitation is not visualized. No aortic stenosis is present.  5. The inferior vena cava is dilated in size with <50% respiratory variability, suggesting right atrial pressure of 15 mmHg. Comparison(s): No significant change from prior study. FINDINGS  Left Ventricle: Left ventricular ejection fraction, by estimation, is 60 to 65%. The left ventricle has normal function. Left ventricular endocardial border not optimally defined to evaluate regional wall motion. The left ventricular internal cavity size was normal in size. There is no left ventricular hypertrophy. Left ventricular diastolic function could not be evaluated due to atrial fibrillation. Left ventricular diastolic function could not be evaluated. Right Ventricle: The right ventricular size is normal. No increase in right ventricular wall thickness. Right ventricular systolic function is normal. There is mildly elevated pulmonary artery systolic pressure. The tricuspid regurgitant velocity is 2.78  m/s, and  with an assumed right atrial pressure of 15 mmHg, the estimated right ventricular systolic pressure  is 45.9 mmHg. Left Atrium: Left atrial size was normal in size. Right Atrium: Right atrial size was normal in size. Pericardium: There is no evidence of pericardial effusion. Presence of pericardial fat pad. Mitral Valve: The mitral valve is grossly normal. No evidence of mitral valve regurgitation. No evidence of mitral valve stenosis. Tricuspid Valve: The tricuspid valve is grossly normal. Tricuspid valve regurgitation is trivial. No evidence of tricuspid stenosis. Aortic Valve: The aortic valve is grossly normal. Aortic valve regurgitation is not visualized. No aortic stenosis is present. Pulmonic Valve: The pulmonic valve was grossly normal. Pulmonic valve regurgitation is trivial. No evidence of pulmonic stenosis. Aorta: The aortic root and ascending aorta are structurally normal, with no evidence of dilitation. Venous: The inferior vena cava is dilated in size with less than 50% respiratory variability, suggesting right atrial pressure of 15 mmHg. IAS/Shunts: The atrial septum is grossly normal.  LEFT VENTRICLE PLAX 2D LVIDd:         4.40 cm LVIDs:         2.60 cm LV PW:         1.40 cm LV IVS:        1.00 cm LVOT diam:     1.80 cm LV SV:         55 LV SV Index:   31 LVOT Area:     2.54 cm  RIGHT VENTRICLE RV S prime:     6.09 cm/s LEFT ATRIUM           Index       RIGHT ATRIUM           Index LA diam:      3.30 cm 1.82 cm/m  RA Area:     16.90 cm LA Vol (A4C): 47.7 ml 26.32 ml/m RA Volume:   40.20 ml  22.19 ml/m  AORTIC VALVE LVOT Vmax:   133.00 cm/s LVOT Vmean:  85.300 cm/s LVOT VTI:    0.218 m  AORTA Ao Root diam: 3.20 cm Ao Asc diam:  3.50 cm TRICUSPID VALVE TR Peak grad:   30.9 mmHg TR Vmax:        278.00 cm/s  SHUNTS Systemic VTI:  0.22 m Systemic Diam: 1.80 cm Eleonore Chiquito MD Electronically signed by Eleonore Chiquito MD Signature Date/Time: 07/22/2020/2:49:09 PM    Final    [place] EKG SITE RITE  Result  Date: 07/22/2020 If Site Rite image not attached, placement could not be confirmed due to current cardiac rhythm.   Microbiology: Recent Results (from the past 240 hour(s))  Resp Panel by RT-PCR (Flu A&B, Covid) Nasopharyngeal Swab     Status: None   Collection Time: 07/21/20  1:36 AM   Specimen: Nasopharyngeal Swab; Nasopharyngeal(NP) swabs in vial transport medium  Result Value Ref Range Status   SARS Coronavirus 2 by RT PCR NEGATIVE NEGATIVE Final    Comment: (NOTE) SARS-CoV-2 target nucleic acids are NOT DETECTED.  The SARS-CoV-2 RNA is generally detectable in upper respiratory specimens during the acute phase of infection. The lowest concentration of SARS-CoV-2 viral copies this assay can detect is 138 copies/mL. A negative result does not preclude SARS-Cov-2 infection and should not be used as the sole basis for treatment or other patient management decisions. A negative result may occur with  improper specimen collection/handling, submission of specimen other than nasopharyngeal swab, presence of viral mutation(s) within the areas targeted by this assay, and inadequate number of viral copies(<138 copies/mL). A negative result must be combined with clinical observations, patient  history, and epidemiological information. The expected result is Negative.  Fact Sheet for Patients:  EntrepreneurPulse.com.au  Fact Sheet for Healthcare Providers:  IncredibleEmployment.be  This test is no t yet approved or cleared by the [place] FDA and  has been authorized for detection and/or diagnosis of SARS-CoV-2 by FDA under an Emergency Use Authorization (EUA). This EUA will remain  in effect (meaning this test can be used) for the duration of the COVID-19 declaration under Section 564(b)(1) of the Act, 21 U.S.C.section 360bbb-3(b)(1), unless the authorization is terminated  or revoked sooner.       Influenza A by PCR NEGATIVE NEGATIVE Final    Influenza B by PCR NEGATIVE NEGATIVE Final    Comment: (NOTE) The Xpert Xpress SARS-CoV-2/FLU/RSV plus assay is intended as an aid in the diagnosis of influenza from Nasopharyngeal swab specimens and should not be used as a sole basis for treatment. Nasal washings and aspirates are unacceptable for Xpert Xpress SARS-CoV-2/FLU/RSV testing.  Fact Sheet for Patients: EntrepreneurPulse.com.au  Fact Sheet for Healthcare Providers: IncredibleEmployment.be  This test is not yet approved or cleared by the [place] FDA and has been authorized for detection and/or diagnosis of SARS-CoV-2 by FDA under an Emergency Use Authorization (EUA). This EUA will remain in effect (meaning this test can be used) for the duration of the COVID-19 declaration under Section 564(b)(1) of the Act, 21 U.S.C. section 360bbb-3(b)(1), unless the authorization is terminated or revoked.  Performed at [facility], [place] [address] [place], [place] [address]   Blood culture (routine x 2)     Status: None   Collection Time: 07/21/20  5:37 AM   Specimen: BLOOD  Result Value Ref Range Status   Specimen Description BLOOD RIGHT ARM  Final   Special Requests   Final    BOTTLES DRAWN AEROBIC AND ANAEROBIC Blood Culture adequate volume   Culture   Final    NO GROWTH 5 DAYS Performed at [facility], [address] [place], [place] [address]    Report Status 07/26/2020 FINAL  Final  Culture, Urine     Status: Abnormal   Collection Time: 07/21/20  6:50 AM   Specimen: Urine, Random  Result Value Ref Range Status   Specimen Description URINE, RANDOM  Final   Special Requests   Final    NONE Performed at [facility], [place] [address] [place], [place] [address]    Culture MULTIPLE SPECIES PRESENT, SUGGEST RECOLLECTION (A)  Final   Report Status 07/22/2020 FINAL  Final  Blood culture (routine x 2)     Status: None   Collection Time: 07/21/20  9:36 AM    Specimen: BLOOD RIGHT HAND  Result Value Ref Range Status   Specimen Description BLOOD RIGHT HAND  Final   Special Requests   Final    BOTTLES DRAWN AEROBIC ONLY Blood Culture adequate volume   Culture   Final    NO GROWTH 5 DAYS Performed at [facility], [place] [address], [place], [place] [address]    Report Status 07/26/2020 FINAL  Final     Labs: Basic Metabolic Panel: Recent Labs  Lab 07/22/20 0439 07/23/20 0640 07/24/20 0507 07/25/20 0500 07/26/20 0500  NA 140 141 140 141 137  K 3.5 2.9* 3.7 4.3 3.9  CL 98 96* 96* 95* 94*  CO2 33* 33* 35* 35* 33*  GLUCOSE 162* 162* 143* 157* 146*  BUN 10 12 11 14 14   CREATININE 0.85 0.80 0.76 0.96  0.89  CALCIUM 8.9 8.6* 8.7* 9.2 8.7*  MG 1.8 1.8 1.9 2.0 1.9  PHOS 3.6 3.3 3.0 3.2 3.2   Liver Function Tests: Recent Labs  Lab 07/22/20 0439 07/23/20 0640 07/24/20 0507 07/25/20 0500 07/26/20 0500  AST 12* 16 16 15  13*  ALT 10 13 14 13 14   ALKPHOS 55 69 84 77 71  BILITOT 1.0 0.7 1.0 0.9 0.7  PROT 6.1* 6.3* 6.3* 6.9 6.1*  ALBUMIN 2.9* 2.8* 2.7* 2.8* 2.5*   No results for input(s): LIPASE, AMYLASE in the last 168 hours. No results for input(s): AMMONIA in the last 168 hours. CBC: Recent Labs  Lab 07/21/20 0018 07/21/20 0455 07/21/20 1600 07/22/20 0439 07/23/20 0640 07/24/20 0507 07/25/20 0500  WBC 17.7*  --  11.1* 10.5 10.7* 11.0* 11.2*  NEUTROABS 12.9*  --   --  7.0 6.9 6.3 6.1  HGB 7.1*   < > 7.9* 7.1* 8.5* 8.4* 9.0*  HCT 24.5*   < > 28.1* 24.0* 27.9* 29.4* 31.3*  MCV 92.8  --  91.2 90.2 87.2 90.2 90.5  PLT 503*  --  416* 325 347 360 387   < > = values in this interval not displayed.   Cardiac Enzymes: No results for input(s): CKTOTAL, CKMB, CKMBINDEX, TROPONINI in the last 168 hours. BNP: BNP (last 3 results) Recent Labs    07/24/20 0930 07/25/20 0500 07/26/20 0500  BNP 228.2* 152.1* 215.3*    ProBNP (last 3 results) No results for input(s): PROBNP in the last 8760 hours.  CBG: Recent Labs  Lab  07/25/20 0742 07/25/20 1122 07/25/20 1657 07/25/20 2029 07/26/20 0805  GLUCAP 158* 225* 203* 97 123*       Signed:  Alma Friendly, MD [facility] 07/26/2020, 9:55 AM

## 2020-07-28 DIAGNOSIS — J189 Pneumonia, unspecified organism: Secondary | ICD-10-CM | POA: Diagnosis not present

## 2020-07-28 DIAGNOSIS — J9601 Acute respiratory failure with hypoxia: Secondary | ICD-10-CM | POA: Diagnosis not present

## 2020-07-28 DIAGNOSIS — M8008XD Age-related osteoporosis with current pathological fracture, vertebra(e), subsequent encounter for fracture with routine healing: Secondary | ICD-10-CM | POA: Diagnosis not present

## 2020-07-28 DIAGNOSIS — I152 Hypertension secondary to endocrine disorders: Secondary | ICD-10-CM | POA: Diagnosis not present

## 2020-07-28 DIAGNOSIS — M80059D Age-related osteoporosis with current pathological fracture, unspecified femur, subsequent encounter for fracture with routine healing: Secondary | ICD-10-CM | POA: Diagnosis not present

## 2020-07-28 DIAGNOSIS — E1159 Type 2 diabetes mellitus with other circulatory complications: Secondary | ICD-10-CM | POA: Diagnosis not present

## 2020-07-29 ENCOUNTER — Telehealth: Payer: Self-pay

## 2020-07-29 DIAGNOSIS — I152 Hypertension secondary to endocrine disorders: Secondary | ICD-10-CM | POA: Diagnosis not present

## 2020-07-29 DIAGNOSIS — J9601 Acute respiratory failure with hypoxia: Secondary | ICD-10-CM | POA: Diagnosis not present

## 2020-07-29 DIAGNOSIS — M80059D Age-related osteoporosis with current pathological fracture, unspecified femur, subsequent encounter for fracture with routine healing: Secondary | ICD-10-CM | POA: Diagnosis not present

## 2020-07-29 DIAGNOSIS — E1159 Type 2 diabetes mellitus with other circulatory complications: Secondary | ICD-10-CM | POA: Diagnosis not present

## 2020-07-29 DIAGNOSIS — M8008XD Age-related osteoporosis with current pathological fracture, vertebra(e), subsequent encounter for fracture with routine healing: Secondary | ICD-10-CM | POA: Diagnosis not present

## 2020-07-29 DIAGNOSIS — J189 Pneumonia, unspecified organism: Secondary | ICD-10-CM | POA: Diagnosis not present

## 2020-07-29 NOTE — Telephone Encounter (Cosign Needed)
Transition Care Management Follow-up Telephone Call  Date of discharge and from where: 07/29/20 [facility]   How have you been since you were released from the hospital? [facility] per son   Any questions or concerns? No  Items Reviewed:  Did the pt receive and understand the discharge instructions provided? Yes   Medications obtained and verified? Yes   Other? No   Any new allergies since your discharge? No   Dietary orders reviewed? Yes  Do you have support at home? Yes   Home Care and Equipment/Supplies: Were home health services ordered? yes If so, what is the name of the agency? [facility]  Has the agency set up a time to come to the patient's home? yes Were any new equipment or medical supplies ordered?  No What is the name of the medical supply agency?  Were you able to get the supplies/equipment? not applicable Do you have any questions related to the use of the equipment or supplies? No  Functional Questionnaire: (I = Independent and D = Dependent) ADLs: D  Bathing/Dressing- D  Meal Prep- D  Eating- D  Maintaining continence- D  Transferring/Ambulation- D  Managing Meds- D  Follow up appointments reviewed:   PCP Hospital f/u appt confirmed? Yes  Scheduled to see Dr Morene Rankins on 08/07/20 @ 2:30.  [facility] f/u appt confirmed? Yes  Scheduled to see Dr Einar Gip  on 08/06/20.  Are transportation arrangements needed? No   If their condition worsens, is the pt aware to call PCP or go to the Emergency Dept.? Yes  Was the patient provided with contact information for the PCP's office or ED? Yes  Was to pt encouraged to call back with questions or concerns? Yes

## 2020-07-29 NOTE — Telephone Encounter (Signed)
Home Health Verbal Orders  Agency:  [facility]   Caller: [place]  Reason for Request:  2 week 1 // 1 week 1 // 1 PRN   Frequency:

## 2020-07-30 ENCOUNTER — Inpatient Hospital Stay: Admission: RE | Admit: 2020-07-30 | Payer: Medicare Other | Source: Ambulatory Visit

## 2020-07-30 NOTE — Telephone Encounter (Signed)
Called Tonya at [facility] to give verbal orders below. She states that the pt declined home visits.

## 2020-07-31 ENCOUNTER — Telehealth: Payer: Self-pay

## 2020-07-31 DIAGNOSIS — I152 Hypertension secondary to endocrine disorders: Secondary | ICD-10-CM | POA: Diagnosis not present

## 2020-07-31 DIAGNOSIS — J9601 Acute respiratory failure with hypoxia: Secondary | ICD-10-CM | POA: Diagnosis not present

## 2020-07-31 DIAGNOSIS — E1159 Type 2 diabetes mellitus with other circulatory complications: Secondary | ICD-10-CM | POA: Diagnosis not present

## 2020-07-31 DIAGNOSIS — M80059D Age-related osteoporosis with current pathological fracture, unspecified femur, subsequent encounter for fracture with routine healing: Secondary | ICD-10-CM | POA: Diagnosis not present

## 2020-07-31 DIAGNOSIS — J189 Pneumonia, unspecified organism: Secondary | ICD-10-CM | POA: Diagnosis not present

## 2020-07-31 DIAGNOSIS — M8008XD Age-related osteoporosis with current pathological fracture, vertebra(e), subsequent encounter for fracture with routine healing: Secondary | ICD-10-CM | POA: Diagnosis not present

## 2020-07-31 NOTE — Telephone Encounter (Signed)
Amber from [facility] called reporting that pt has been experiencing nausea & vomiting for the past 3 days. Pt started taking metoprolol 25 mg extended release & amiodarone 200 mg after getting home from rehab. Amber believes the medication is causing these side effects. Amber asked if [name] could look into this before pt is seen next week for her appointment and asked if the medication needs to be help or decreased. Amber states pt is still having the irregular heart beat but her vitals are stable. Please call pts son with any updates.

## 2020-08-01 ENCOUNTER — Telehealth: Payer: Self-pay

## 2020-08-01 NOTE — Telephone Encounter (Signed)
FYI

## 2020-08-01 NOTE — Telephone Encounter (Signed)
Tell her to hold Amiodarone for 1 week and then start 1/2 daily.

## 2020-08-01 NOTE — Telephone Encounter (Signed)
Patient's daughter called regarding patient's medication changes patient was at the ED recently and she was started on some new medications daughter states patient throws up every morning she gives her medication metoprolol, diltiazem, amiodarone and spironolactone please advise

## 2020-08-01 NOTE — Telephone Encounter (Signed)
[facility] is calling to request verbal orders for Physical Therapy  1x a week for 2 weeks for resumption of care with the plan to restart pt next week  Parc to leave voicemail

## 2020-08-01 NOTE — Telephone Encounter (Signed)
Please call patient's son and let them know that I have reviewed this message. I would like for them to call Dr. Einar Gip, her heart doctor, to let them know about this and see if he has any specific recommendations.

## 2020-08-01 NOTE — Telephone Encounter (Signed)
Patient's son is aware of PCP recommendations

## 2020-08-02 ENCOUNTER — Telehealth: Payer: Self-pay

## 2020-08-02 DIAGNOSIS — J189 Pneumonia, unspecified organism: Secondary | ICD-10-CM | POA: Diagnosis not present

## 2020-08-02 DIAGNOSIS — M8008XD Age-related osteoporosis with current pathological fracture, vertebra(e), subsequent encounter for fracture with routine healing: Secondary | ICD-10-CM | POA: Diagnosis not present

## 2020-08-02 DIAGNOSIS — I152 Hypertension secondary to endocrine disorders: Secondary | ICD-10-CM | POA: Diagnosis not present

## 2020-08-02 DIAGNOSIS — J9601 Acute respiratory failure with hypoxia: Secondary | ICD-10-CM | POA: Diagnosis not present

## 2020-08-02 DIAGNOSIS — E1159 Type 2 diabetes mellitus with other circulatory complications: Secondary | ICD-10-CM | POA: Diagnosis not present

## 2020-08-02 DIAGNOSIS — M80059D Age-related osteoporosis with current pathological fracture, unspecified femur, subsequent encounter for fracture with routine healing: Secondary | ICD-10-CM | POA: Diagnosis not present

## 2020-08-02 NOTE — Telephone Encounter (Signed)
Ok for verbal orders for PT.

## 2020-08-02 NOTE — Telephone Encounter (Signed)
Patient son called back, I advised him know to have patient stop Amiodarone for 1 week and then start 1/2 tab daily. She will be here on Tuesday for her appointment.

## 2020-08-02 NOTE — Telephone Encounter (Signed)
Please advise 

## 2020-08-02 NOTE — Telephone Encounter (Signed)
Verbal orders given  

## 2020-08-05 DIAGNOSIS — J189 Pneumonia, unspecified organism: Secondary | ICD-10-CM | POA: Diagnosis not present

## 2020-08-05 DIAGNOSIS — J9601 Acute respiratory failure with hypoxia: Secondary | ICD-10-CM | POA: Diagnosis not present

## 2020-08-05 DIAGNOSIS — M80059D Age-related osteoporosis with current pathological fracture, unspecified femur, subsequent encounter for fracture with routine healing: Secondary | ICD-10-CM | POA: Diagnosis not present

## 2020-08-05 DIAGNOSIS — E1159 Type 2 diabetes mellitus with other circulatory complications: Secondary | ICD-10-CM | POA: Diagnosis not present

## 2020-08-05 DIAGNOSIS — M8008XD Age-related osteoporosis with current pathological fracture, vertebra(e), subsequent encounter for fracture with routine healing: Secondary | ICD-10-CM | POA: Diagnosis not present

## 2020-08-05 DIAGNOSIS — I152 Hypertension secondary to endocrine disorders: Secondary | ICD-10-CM | POA: Diagnosis not present

## 2020-08-05 NOTE — Telephone Encounter (Signed)
MA spoke to patient regarding medication pt is aware

## 2020-08-06 ENCOUNTER — Encounter: Payer: Self-pay | Admitting: Cardiology

## 2020-08-06 ENCOUNTER — Ambulatory Visit: Payer: Medicare Other | Admitting: Cardiology

## 2020-08-06 ENCOUNTER — Other Ambulatory Visit: Payer: Self-pay

## 2020-08-06 VITALS — BP 140/72 | HR 80 | Resp 16 | Ht 60.0 in | Wt 186.0 lb

## 2020-08-06 DIAGNOSIS — I5032 Chronic diastolic (congestive) heart failure: Secondary | ICD-10-CM | POA: Diagnosis not present

## 2020-08-06 DIAGNOSIS — I4821 Permanent atrial fibrillation: Secondary | ICD-10-CM

## 2020-08-06 DIAGNOSIS — I10 Essential (primary) hypertension: Secondary | ICD-10-CM | POA: Diagnosis not present

## 2020-08-06 MED ORDER — FUROSEMIDE 40 MG PO TABS: 40.0000 mg | ORAL_TABLET | Freq: Every day | ORAL | 1 refills | Status: DC

## 2020-08-06 NOTE — Progress Notes (Signed)
Primary Physician/Referring:  [name], PA  Patient ID: Crystal Ashley, female    DOB: 1945-12-10, 74 y.o.   MRN: [id number]  Chief Complaint  Patient presents with  . Atrial Fibrillation  . Follow-up    4 Weeks  . Edema   HPI:    Crystal Ashley  is a 74 y.o. Asian [place] female with type 2 diabetes, hyperlipidemia, hypertension, hypothyroidism, history of endometrial cancer in 2015, permanent atrial fibrillation on Xarelto history of uterine cancer status post hysterectomy, with poor functional status since pelvic fracture in June 2021 was discharged to rehab, has had inpatient and outpatient rehab, also underwent fusion of T10 and T11 fractures after the fall on 02/26/2020, last admission to the hospital on 07/20/2020 with acute diastolic CHF.  She now presents to the office for follow-up of permanent atrial fibrillation, chronic diastolic heart failure and dyspnea on exertion.   She is presently undergoing home health physical therapy.  Has essentially been wheelchair-bound.  Denies dyspnea or chest pain or palpitations or leg edema.  Accompanied by her son.  She had complained of nausea and hence amiodarone was put on hold, she has noticed mild improvement in nausea.   Past Medical History:  Diagnosis Date  . Atrial fibrillation ([place])   . Cancer ([place])   . Cataract   . Diabetes mellitus without complication ([place])   . Fracture 2001   left ankle  . Glaucoma   . Hypertension   . Thyroid disease    Past Surgical History:  Procedure Laterality Date  . APPLICATION OF ROBOTIC ASSISTANCE FOR SPINAL PROCEDURE N/A 02/26/2020   Procedure: APPLICATION OF ROBOTIC ASSISTANCE FOR SPINAL PROCEDURE;  Surgeon: [name], MD;  Location: [facility];  Service: Neurosurgery;  Laterality: N/A;  . CARDIOVERSION N/A 04/08/2018   Procedure: CARDIOVERSION;  Surgeon: Adrian Prows, MD;  Location: [facility] ENDOSCOPY;  Service: Cardiovascular;  Laterality: N/A;  . CARDIOVERSION N/A 05/22/2019    Procedure: CARDIOVERSION;  Surgeon: Adrian Prows, MD;  Location: [place];  Service: Cardiovascular;  Laterality: N/A;  . FOOT SURGERY Left   . HYSTEROSCOPY     POLYPECTOMY  . LUMBAR PERCUTANEOUS PEDICLE SCREW 4 LEVEL N/A 02/26/2020   Procedure: Thoracic eight to thoracic twelve posterior percutaneous instrumentation with cement augmentation and bilateral medial facetectomy for decompression at Thoracic ten-eleven;  Surgeon: [name], MD;  Location: [place];  Service: Neurosurgery;  Laterality: N/A;   Social History   Tobacco Use  . Smoking status: Never Smoker  . Smokeless tobacco: Never Used  Substance Use Topics  . Alcohol use: No   Marital Status: Widowed   ROS  Review of Systems  Cardiovascular: Negative for chest pain, dyspnea on exertion and leg swelling.  Musculoskeletal: Positive for arthritis, back pain and muscle weakness.  Gastrointestinal: Positive for constipation and nausea. Negative for melena.   Objective    Vitals with BMI 08/07/2020 08/06/2020 07/26/2020  Height - 5\' 0"  -  Weight - 186 lbs -  BMI - [id number] -  Systolic [id number]  Diastolic 60 72 79  Pulse 70 80 96    Physical Exam Constitutional:      General: She is not in acute distress.    Appearance: She is obese.     Comments: Moderately built and moderately obese in no acute distress.  Neck:     Thyroid: No thyromegaly.     Vascular: No JVD.  Cardiovascular:     Rate and Rhythm: Normal rate. Rhythm irregularly  irregular.     Pulses: Intact distal pulses.          Carotid pulses are 2+ on the right side and 2+ on the left side.      Radial pulses are 2+ on the right side and 2+ on the left side.       Dorsalis pedis pulses are 1+ on the right side and 1+ on the left side.       Posterior tibial pulses are 1+ on the right side and 1+ on the left side.     Heart sounds: No murmur heard. No gallop. No S3 or S4 sounds.      Comments: 2+ bilateral pitting below-knee leg edema.  No  JVD. Pulmonary:     Effort: Pulmonary effort is normal.     Breath sounds: Normal breath sounds.  Abdominal:     General: Bowel sounds are normal.     Palpations: Abdomen is soft.  Musculoskeletal:     Cervical back: Normal range of motion.  Neurological:     Mental Status: She is alert.    Laboratory examination:   Recent Labs    02/19/20 0659 02/25/20 0830 02/26/20 0839 02/28/20 1508 05/22/20 1128 07/24/20 0507 07/25/20 0500 07/26/20 0500  NA 141  --    < > 139   < > 140 141 137  K 3.8  --    < > 3.7   < > 3.7 4.3 3.9  CL 103  --   --  102   < > 96* 95* 94*  CO2 28  --   --  27   < > 35* 35* 33*  GLUCOSE 146*  --   --  275*   < > 143* 157* 146*  BUN 14  --   --  15   < > 11 14 14   CREATININE 0.93 0.85  --  0.81   < > 0.76 0.96 0.89  CALCIUM 8.5*  --   --  8.2*   < > 8.7* 9.2 8.7*  GFRNONAA >60 >60  --  >60   < > >60 >60 >60  GFRAA >60 >60  --  >60  --   --   --   --    < > = values in this interval not displayed.   CMP Latest Ref Rng & Units 07/26/2020 07/25/2020 07/24/2020  Glucose 70 - 99 mg/dL 146(H) 157(H) 143(H)  BUN 8 - 23 mg/dL 14 14 11   Creatinine 0.44 - 1.00 mg/dL 0.89 0.96 0.76  Sodium 135 - 145 mmol/L 137 141 140  Potassium 3.5 - 5.1 mmol/L 3.9 4.3 3.7  Chloride 98 - 111 mmol/L 94(L) 95(L) 96(L)  CO2 22 - 32 mmol/L 33(H) 35(H) 35(H)  Calcium 8.9 - 10.3 mg/dL 8.7(L) 9.2 8.7(L)  Total Protein 6.5 - 8.1 g/dL 6.1(L) 6.9 6.3(L)  Total Bilirubin 0.3 - 1.2 mg/dL 0.7 0.9 1.0  Alkaline Phos 38 - 126 U/L 71 77 84  AST 15 - 41 U/L 13(L) 15 16  ALT 0 - 44 U/L 14 13 14    CBC Latest Ref Rng & Units 07/25/2020 07/24/2020 07/23/2020  WBC 4.0 - 10.5 K/uL 11.2(H) 11.0(H) 10.7(H)  Hemoglobin 12.0 - 15.0 g/dL 9.0(L) 8.4(L) 8.5(L)  Hematocrit 36.0 - 46.0 % 31.3(L) 29.4(L) 27.9(L)  Platelets 150 - 400 K/uL 387 360 347   Lipid Panel     Component Value Date/Time   CHOL 98 07/22/2020 0439   TRIG 87 07/22/2020 0439   HDL  30 (L) 07/22/2020 0439   CHOLHDL 3.3 07/22/2020  0439   VLDL 17 07/22/2020 0439   LDLCALC 51 07/22/2020 0439   HEMOGLOBIN A1C Lab Results  Component Value Date   HGBA1C 6.0 (H) 07/22/2020   MPG 125.5 07/22/2020   TSH Recent Labs    05/26/20 1142 07/21/20 0812 07/22/20 0439  TSH 5.001* 2.412 2.717   Medications   Current Outpatient Medications on File Prior to Visit  Medication Sig Dispense Refill  . amiodarone (PACERONE) 200 MG tablet Take 1 tablet (200 mg total) by mouth daily. (Patient taking differently: Take 200 mg by mouth daily. HOLD until 24th, then start 1/2 tablet) 30 tablet 0  . atorvastatin (LIPITOR) 10 MG tablet Take 1 tablet (10 mg total) by mouth daily. 90 tablet 3  . Calcium-Phosphorus-Vitamin D (CITRACAL +D3 PO) Take 1 tablet by mouth daily.    . dorzolamide-timolol (COSOPT) 22.3-6.8 MG/ML ophthalmic solution Place 1 drop into both eyes daily.    Demetra [employer] (LANCET DEVICE WITH EJECTOR) MISC Use to check blood sugar daily and prn 1 each 1  . Lancets 33G MISC Use to check blood sugar daily and prn 100 each 3  . levothyroxine (SYNTHROID) 75 MCG tablet Take 1 tablet (75 mcg total) by mouth daily before breakfast. 90 tablet 3  . Menthol, Topical Analgesic, (BIOFREEZE) 4 % GEL Apply 1 application topically daily as needed (pain). Apply to right side rib cage and lateral thigh    . metFORMIN (GLUCOPHAGE) 1000 MG tablet Take 1 tablet (1,000 mg total) by mouth daily with breakfast. 90 tablet 3  . potassium chloride (MICRO-K) 10 MEQ CR capsule Take 10 mEq by mouth daily.    . rivaroxaban (XARELTO) 20 MG TABS tablet Take 1 tablet (20 mg total) by mouth daily with supper. (Patient taking differently: Take 20 mg by mouth daily.) 90 tablet 1  . sennosides (SENOKOT) 8.8 MG/5ML syrup Take 10 mLs by mouth daily as needed for mild constipation.     . travoprost, benzalkonium, (TRAVATAN) 0.004 % ophthalmic solution Place 1 drop into both eyes at bedtime.     . vitamin B-12 (CYANOCOBALAMIN) 500 MCG tablet Take 500 mcg by mouth  daily.      No current facility-administered medications on file prior to visit.    Cardiac Studies:   Lexiscan myoview stress test 04/01/2018: 1. Lexiscan stress test was performed. Exercise capacity was not assessed. No stress symptoms reported. Normal blood pressure. The resting and stress electrocardiogram demonstrated atrial fibrillation with rapid ventricular rate, low voltage, and normal rest repolarization. Stress EKG is non diagnostic for ischemia as it is a pharmacologic stress. 2. The overall quality of the study is fair. Review of the raw data in a rotational cine format reveals breast attenuation, with imaging performed in sitting position. Gated SPECT images reveal normal myocardial thickening and wall motion. The left ventricular ejection fraction was calculated or visually estimated to be 47%. REST and STRESS images demonstrate decreased tracer uptake in the mid inferoseptal, mid inferior, apical septal and apical inferior segments of the left ventricle, worse on rest images. While defect likely represents breast attenuation, ischemia in this region cannot be excluded. Clinical correlation recommended. 3. Low to intermediate risk study.  Echocardiogram 07/22/2020 :   1. Left ventricular ejection fraction, by estimation, is 60 to 65%. The left ventricle has normal function. Left ventricular endocardial border not optimally defined to evaluate regional wall motion. Left ventricular diastolic function could not be  evaluated.  2. Right  ventricular systolic function is normal. The right ventricular size is normal. There is mildly elevated pulmonary artery systolic pressure. The estimated right ventricular systolic pressure is 95.1 mmHg.  3. The mitral valve is grossly normal. No evidence of mitral valve regurgitation. No evidence of mitral stenosis.  4. The aortic valve is grossly normal. Aortic valve regurgitation is not visualized. No aortic stenosis is present.  5. The inferior  vena cava is dilated in size with <50% respiratory variability, suggesting right atrial pressure of 15 mmHg.  Comparison(s): No significant change from prior study, 02/28/2020.   EKG:  EKG 08/06/2020: Atrial fibrillation with controlled ventricular response at the rate of 95 bpm, leftward axis, nonspecific T abnormality.  Low-voltage complexes.   Compared to EKG 06/25/2020, PV abnormality in the anteroseptal region suggestive of ischemia not present.   EKG 01/29/2020: Atrial fibrillation with rapid ventricular response at rate of 111 bpm, normal axis, nonspecific T wave abnormality.  Low-voltage complexes.  Compared to 05/31/2019, rapid ventricular response is new.  Assessment     ICD-10-CM   1. Permanent atrial fibrillation (HCC)  I48.21 EKG 12-Lead  2. Chronic diastolic (congestive) heart failure (HCC)  I50.32   3. Essential hypertension  I10    CHA2DS2-VASc Score is 5.  Yearly risk of stroke: 7.2% (F, A, HTN, DM, CHF).  Score of 1=0.6; 2=2.2; 3=3.2; 4=4.8; 5=7.2; 6=9.8; 7=>9.8) -(CHF; HTN; vasc disease DM,  Female = 1; Age <65 =0; 65-74 = 1,  >75 =2; stroke/embolism= 2).     Meds ordered this encounter  Medications  . furosemide (LASIX) 40 MG tablet    Sig: Take 1 tablet (40 mg total) by mouth daily. Change to every other day after 10 Lbs weight loss  08/06/2020    Dispense:  90 tablet    Refill:  1    Medications Discontinued During This Encounter  Medication Reason  . furosemide (LASIX) 40 MG tablet     Orders Placed This Encounter  Procedures  . EKG 12-Lead    Recommendations:   Crystal Ashley  is a 74 y.o. [place] female with type 2 diabetes, hyperlipidemia, hypertension, hypothyroidism, history of endometrial cancer in 2015, permanent atrial fibrillation on Xarelto history of uterine cancer status post hysterectomy, with poor functional status since pelvic fracture in June 2021 was discharged to rehab, has had inpatient and outpatient rehab, also underwent  fusion of T10 and T11 fractures after the fall on 02/26/2020, last admission to the hospital on 07/20/2020 with acute diastolic CHF.  She now presents to the office for follow-up of permanent atrial fibrillation, chronic diastolic heart failure and dyspnea on exertion.  Advised her to restart amiodarone at 100 mg daily, continue anticoagulation in view of high cardioembolic risk.  She has mild leg edema and presently has mild fluid overload state.  Advised her to restart furosemide 40 mg daily as directed.  With regard to nausea, suspect it could be related to GERD, multiple medications, diabetic gastroparesis as well.  Otherwise blood pressure is well controlled today, atrial fibrillation is rate controlled.  Otherwise stable from cardiac standpoint, we discussed weightbearing exercises to improve her physical conditioning.  Unless she has new problems from cardiac standpoint I will see her back in 6 months.      Adrian Prows, MD, [facility] 08/18/2020, 9:23 AM Office: [phone] Pager: [phone]

## 2020-08-07 ENCOUNTER — Encounter: Payer: Self-pay | Admitting: Physician Assistant

## 2020-08-07 ENCOUNTER — Ambulatory Visit (INDEPENDENT_AMBULATORY_CARE_PROVIDER_SITE_OTHER): Payer: Medicare Other | Admitting: Physician Assistant

## 2020-08-07 VITALS — BP 110/60 | HR 70 | Temp 97.2°F

## 2020-08-07 DIAGNOSIS — I5042 Chronic combined systolic (congestive) and diastolic (congestive) heart failure: Secondary | ICD-10-CM | POA: Diagnosis not present

## 2020-08-07 DIAGNOSIS — K5900 Constipation, unspecified: Secondary | ICD-10-CM

## 2020-08-07 MED ORDER — DOCUSATE SODIUM 100 MG PO CAPS: 100.0000 mg | ORAL_CAPSULE | Freq: Two times a day (BID) | ORAL | 2 refills | Status: DC

## 2020-08-07 MED ORDER — PANTOPRAZOLE SODIUM 40 MG PO TBEC: 40.0000 mg | DELAYED_RELEASE_TABLET | Freq: Every day | ORAL | 2 refills | Status: DC

## 2020-08-07 NOTE — Progress Notes (Signed)
Crystal Ashley is a 74 y.o. female is here to discuss:  I acted as a Education administrator for [employer], PA-C Anselmo Pickler, LPN   History of Present Illness:   Chief Complaint  Patient presents with  . Hospitalization Follow-up    HPI   Hospital f/u Pt s/p hospitalization from 07/20/20 to 07/26/20 for atrial fibrillation and acute on chronic CHF. She followed up yesterday with Dr. Einar Gip, her cardiologist.  Denies SOB. Is having some lower extremity edema and is using ace wraps on legs. Dr. Einar Gip put her on a fluid restriction of 40 oz.   Constipation Her son has noticed that she is having reduced frequency of BMs. It is normal for her to have a regular BM daily. Did not have a BM yesterday and today had small pellets. Denies rectal bleeding, abdominal pain. He bought her OTC senokot without significant relief of symptoms.    GERD Takes protonix 40 mg daily. Needs refill. Tolerates this well and it controls her symptoms.  Health Maintenance Due  Topic Date Due  . URINE MICROALBUMIN  Never done  . Fecal DNA (Cologuard)  Never done  . FOOT EXAM  06/15/2019  . COVID-19 Vaccine (3 - Booster for [employer] series) 05/16/2020    Past Medical History:  Diagnosis Date  . Atrial fibrillation ([place])   . Cancer ([place])   . Cataract   . Diabetes mellitus without complication ([place])   . Fracture 2001   left ankle  . Glaucoma   . Hypertension   . Thyroid disease      Social History   Tobacco Use  . Smoking status: Never Smoker  . Smokeless tobacco: Never Used  Vaping Use  . Vaping Use: Never used  Substance Use Topics  . Alcohol use: No  . Drug use: Never    Past Surgical History:  Procedure Laterality Date  . APPLICATION OF ROBOTIC ASSISTANCE FOR SPINAL PROCEDURE N/A 02/26/2020   Procedure: APPLICATION OF ROBOTIC ASSISTANCE FOR SPINAL PROCEDURE;  Surgeon: [name], MD;  Location: [place];  Service: Neurosurgery;  Laterality: N/A;  . CARDIOVERSION N/A 04/08/2018    Procedure: CARDIOVERSION;  Surgeon: Adrian Prows, MD;  Location: [facility] ENDOSCOPY;  Service: Cardiovascular;  Laterality: N/A;  . CARDIOVERSION N/A 05/22/2019   Procedure: CARDIOVERSION;  Surgeon: Adrian Prows, MD;  Location: [place];  Service: Cardiovascular;  Laterality: N/A;  . FOOT SURGERY Left   . HYSTEROSCOPY     POLYPECTOMY  . LUMBAR PERCUTANEOUS PEDICLE SCREW 4 LEVEL N/A 02/26/2020   Procedure: Thoracic eight to thoracic twelve posterior percutaneous instrumentation with cement augmentation and bilateral medial facetectomy for decompression at Thoracic ten-eleven;  Surgeon: [name], MD;  Location: [place];  Service: Neurosurgery;  Laterality: N/A;    Family History  Problem Relation Age of Onset  . Diabetes Son   . Healthy Mother   . Healthy Father     PMHx, SurgHx, SocialHx, FamHx, Medications, and Allergies were reviewed in the Visit Navigator and updated as appropriate.   Patient Active Problem List   Diagnosis Date Noted  . Bilateral pleural effusion 07/21/2020  . Pulmonary edema 07/21/2020  . Acute on chronic diastolic CHF (congestive heart failure) ([place]) 07/21/2020  . Chronic blood loss anemia 07/21/2020  . Controlled type 2 diabetes mellitus with hyperglycemia ([place]) 07/21/2020  . Hypothyroidism, adult 07/21/2020  . Physical deconditioning 07/21/2020  . Pressure injury of skin 05/27/2020  . SOB (shortness of breath) 05/26/2020  . PNA (pneumonia) 05/22/2020  .  Elective surgery   . Fusion of spine, thoracolumbar region 02/26/2020  . Acute blood loss anemia   . PAF (paroxysmal atrial fibrillation) ([place])   . Controlled type 2 diabetes mellitus with hyperglycemia, without long-term current use of insulin ([place])   . Fracture of vertebra due to osteoporosis ([place])   . Acute midline thoracic back pain   . Multiple traumatic injuries 02/12/2020  . Closed fracture of multiple pubic rami, right, initial encounter ([place]) 02/07/2020  . Atrial fibrillation, chronic ([place])  02/07/2020  . Chronic combined systolic (congestive) and diastolic (congestive) heart failure ([place])   . Acute respiratory failure with hypoxia ([place]) 10/18/2018  . Declining mobility 09/23/2018  . B12 deficiency 09/23/2018  . Morbid obesity with BMI of 40.0-44.9, adult ([place]) 09/23/2018  . Anemia 09/23/2018  . History of cardioversion 04/26/2018  . Type 2 diabetes mellitus without complication, without long-term current use of insulin ([place]) 03/15/2018  . Atrial fibrillation with RVR ([place]) 03/15/2018  . Morbid obesity ([place]) 01/04/2017  . Hyperlipidemia associated with type 2 diabetes mellitus ([place]) 01/04/2017  . Vitamin D deficiency 12/15/2016  . Glaucoma   . Hypertension associated with diabetes ([place]) 04/19/2014  . Hypothyroidism 04/19/2014  . History of endometrial cancer 03/30/2014    Social History   Tobacco Use  . Smoking status: Never Smoker  . Smokeless tobacco: Never Used  Vaping Use  . Vaping Use: Never used  Substance Use Topics  . Alcohol use: No  . Drug use: Never    Current Medications and Allergies:    Current Outpatient Medications:  .  amiodarone (PACERONE) 200 MG tablet, Take 1 tablet (200 mg total) by mouth daily. (Patient taking differently: Take 200 mg by mouth daily. HOLD until 24th, then start 1/2 tablet), Disp: 30 tablet, Rfl: 0 .  atorvastatin (LIPITOR) 10 MG tablet, Take 1 tablet (10 mg total) by mouth daily., Disp: 90 tablet, Rfl: 3 .  Calcium-Phosphorus-Vitamin D (CITRACAL +D3 PO), Take 1 tablet by mouth daily., Disp: , Rfl:  .  diltiazem (CARDIZEM CD) 120 MG 24 hr capsule, Take 1 capsule (120 mg total) by mouth daily., Disp: 30 capsule, Rfl: 0 .  dorzolamide-timolol (COSOPT) 22.3-6.8 MG/ML ophthalmic solution, Place 1 drop into both eyes daily., Disp: , Rfl:  .  furosemide (LASIX) 40 MG tablet, Take 1 tablet (40 mg total) by mouth daily. Change to every other day after 10 Lbs weight loss  08/06/2020, Disp: 90 tablet, Rfl: 1 .  Lancet Devices (LANCET  DEVICE WITH EJECTOR) MISC, Use to check blood sugar daily and prn, Disp: 1 each, Rfl: 1 .  Lancets 33G MISC, Use to check blood sugar daily and prn, Disp: 100 each, Rfl: 3 .  levothyroxine (SYNTHROID) 75 MCG tablet, Take 1 tablet (75 mcg total) by mouth daily before breakfast., Disp: 90 tablet, Rfl: 3 .  Menthol, Topical Analgesic, (BIOFREEZE) 4 % GEL, Apply 1 application topically daily as needed (pain). Apply to right side rib cage and lateral thigh, Disp: , Rfl:  .  metFORMIN (GLUCOPHAGE) 1000 MG tablet, Take 1 tablet (1,000 mg total) by mouth daily with breakfast., Disp: 90 tablet, Rfl: 3 .  metoprolol succinate (TOPROL XL) 25 MG 24 hr tablet, Take 1 tablet (25 mg total) by mouth daily., Disp: 30 tablet, Rfl: 0 .  potassium chloride (MICRO-K) 10 MEQ CR capsule, Take 10 mEq by mouth daily., Disp: , Rfl:  .  rivaroxaban (XARELTO) 20 MG TABS tablet, Take 1 tablet (20 mg total) by mouth daily with supper. (  Patient taking differently: Take 20 mg by mouth daily.), Disp: 90 tablet, Rfl: 1 .  sennosides (SENOKOT) 8.8 MG/5ML syrup, Take 10 mLs by mouth daily as needed for mild constipation. , Disp: , Rfl:  .  spironolactone (ALDACTONE) 25 MG tablet, Take 1 tablet (25 mg total) by mouth daily., Disp: 30 tablet, Rfl: 0 .  travoprost, benzalkonium, (TRAVATAN) 0.004 % ophthalmic solution, Place 1 drop into both eyes at bedtime. , Disp: , Rfl:  .  vitamin B-12 (CYANOCOBALAMIN) 500 MCG tablet, Take 500 mcg by mouth daily. , Disp: , Rfl:  .  docusate sodium (COLACE) 100 MG capsule, Take 1 capsule (100 mg total) by mouth 2 (two) times daily., Disp: 180 capsule, Rfl: 2 .  pantoprazole (PROTONIX) 40 MG tablet, Take 1 tablet (40 mg total) by mouth daily., Disp: 90 tablet, Rfl: 2   Allergies  Allergen Reactions  . 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    Review of Systems   ROS Negative unless otherwise specified per  HPI.  Vitals:   Vitals:   08/07/20 1437  BP: 110/60  Pulse: 70  Temp: (!) 97.2 F (36.2 C)  TempSrc: Temporal  SpO2: 98%     There is no height or weight on file to calculate BMI.   Physical Exam:    Physical Exam Vitals and nursing note reviewed.  Constitutional:      General: She is not in acute distress.    Appearance: She is well-developed. She is not ill-appearing, toxic-appearing or sickly-appearing.  Cardiovascular:     Rate and Rhythm: Normal rate and regular rhythm.     Pulses: Normal pulses.     Heart sounds: Normal heart sounds, S1 normal and S2 normal.     Comments: ACE wraps around bilateral legs Pulmonary:     Effort: Pulmonary effort is normal.     Breath sounds: Normal breath sounds.  Skin:    General: Skin is warm, dry and intact.  Neurological:     Mental Status: She is alert.     GCS: GCS eye subscore is 4. GCS verbal subscore is 5. GCS motor subscore is 6.  Psychiatric:        Mood and Affect: Mood and affect normal.        Speech: Speech normal.        Behavior: Behavior normal. Behavior is cooperative.      Assessment and Plan:    Vandy was seen today for hospitalization follow-up.  Diagnoses and all orders for this visit:  Chronic combined systolic (congestive) and diastolic (congestive) heart failure (HCC) Stable per patient. Management per cardiology.  Constipation, unspecified constipation type Uncontrolled. Will change patient to colace BID. Add 1 capful miralax daily until results of one formed bowel movement daily. May add senokot as needed but recommended avoiding regular use of this.  Other orders -     pantoprazole (PROTONIX) 40 MG tablet; Take 1 tablet (40 mg total) by mouth daily. -     docusate sodium (COLACE) 100 MG capsule; Take 1 capsule (100 mg total) by mouth 2 (two) times daily.   CMA or LPN served as scribe during this visit. History, Physical, and Plan performed by medical provider. The above documentation has  been reviewed and is accurate and complete.  Time spent with patient today was 25 minutes which consisted of chart review, discussing diagnosis, work up, treatment answering questions and documentation.   [name]  Morene Rankins, PA-C [place], [facility] 08/07/2020  Follow-up: No follow-ups on file.

## 2020-08-07 NOTE — Patient Instructions (Signed)
It was great to see you!  Review the constipation handout and try to implement the recommendations we made.  Colace (docusate sodium) 100 mg twice daily + 1 capful of miralax daily  If needed, can add on the geri-kot that you have. Try not to use this regularly, can be harsh.  Let's follow-up in 3 months, sooner if you have concerns.  Take care,  [name] PA-C

## 2020-08-09 DIAGNOSIS — Z8542 Personal history of malignant neoplasm of other parts of uterus: Secondary | ICD-10-CM | POA: Diagnosis not present

## 2020-08-09 DIAGNOSIS — Z7902 Long term (current) use of antithrombotics/antiplatelets: Secondary | ICD-10-CM | POA: Diagnosis not present

## 2020-08-09 DIAGNOSIS — J9601 Acute respiratory failure with hypoxia: Secondary | ICD-10-CM | POA: Diagnosis not present

## 2020-08-09 DIAGNOSIS — E1136 Type 2 diabetes mellitus with diabetic cataract: Secondary | ICD-10-CM | POA: Diagnosis not present

## 2020-08-09 DIAGNOSIS — M8008XD Age-related osteoporosis with current pathological fracture, vertebra(e), subsequent encounter for fracture with routine healing: Secondary | ICD-10-CM | POA: Diagnosis not present

## 2020-08-09 DIAGNOSIS — Z7984 Long term (current) use of oral hypoglycemic drugs: Secondary | ICD-10-CM | POA: Diagnosis not present

## 2020-08-09 DIAGNOSIS — I110 Hypertensive heart disease with heart failure: Secondary | ICD-10-CM | POA: Diagnosis not present

## 2020-08-09 DIAGNOSIS — M80059D Age-related osteoporosis with current pathological fracture, unspecified femur, subsequent encounter for fracture with routine healing: Secondary | ICD-10-CM | POA: Diagnosis not present

## 2020-08-09 DIAGNOSIS — R32 Unspecified urinary incontinence: Secondary | ICD-10-CM | POA: Diagnosis not present

## 2020-08-09 DIAGNOSIS — D649 Anemia, unspecified: Secondary | ICD-10-CM | POA: Diagnosis not present

## 2020-08-09 DIAGNOSIS — H409 Unspecified glaucoma: Secondary | ICD-10-CM | POA: Diagnosis not present

## 2020-08-09 DIAGNOSIS — I5043 Acute on chronic combined systolic (congestive) and diastolic (congestive) heart failure: Secondary | ICD-10-CM | POA: Diagnosis not present

## 2020-08-09 DIAGNOSIS — E039 Hypothyroidism, unspecified: Secondary | ICD-10-CM | POA: Diagnosis not present

## 2020-08-09 DIAGNOSIS — R1312 Dysphagia, oropharyngeal phase: Secondary | ICD-10-CM | POA: Diagnosis not present

## 2020-08-09 DIAGNOSIS — J811 Chronic pulmonary edema: Secondary | ICD-10-CM | POA: Diagnosis not present

## 2020-08-09 DIAGNOSIS — Z794 Long term (current) use of insulin: Secondary | ICD-10-CM | POA: Diagnosis not present

## 2020-08-09 DIAGNOSIS — E1165 Type 2 diabetes mellitus with hyperglycemia: Secondary | ICD-10-CM | POA: Diagnosis not present

## 2020-08-09 DIAGNOSIS — I152 Hypertension secondary to endocrine disorders: Secondary | ICD-10-CM | POA: Diagnosis not present

## 2020-08-09 DIAGNOSIS — E1159 Type 2 diabetes mellitus with other circulatory complications: Secondary | ICD-10-CM | POA: Diagnosis not present

## 2020-08-12 ENCOUNTER — Telehealth: Payer: Self-pay

## 2020-08-12 DIAGNOSIS — I5043 Acute on chronic combined systolic (congestive) and diastolic (congestive) heart failure: Secondary | ICD-10-CM | POA: Diagnosis not present

## 2020-08-12 DIAGNOSIS — I110 Hypertensive heart disease with heart failure: Secondary | ICD-10-CM | POA: Diagnosis not present

## 2020-08-12 DIAGNOSIS — E1159 Type 2 diabetes mellitus with other circulatory complications: Secondary | ICD-10-CM | POA: Diagnosis not present

## 2020-08-12 DIAGNOSIS — E039 Hypothyroidism, unspecified: Secondary | ICD-10-CM | POA: Diagnosis not present

## 2020-08-12 DIAGNOSIS — J9601 Acute respiratory failure with hypoxia: Secondary | ICD-10-CM | POA: Diagnosis not present

## 2020-08-12 DIAGNOSIS — I152 Hypertension secondary to endocrine disorders: Secondary | ICD-10-CM | POA: Diagnosis not present

## 2020-08-13 DIAGNOSIS — J9601 Acute respiratory failure with hypoxia: Secondary | ICD-10-CM | POA: Diagnosis not present

## 2020-08-13 DIAGNOSIS — I110 Hypertensive heart disease with heart failure: Secondary | ICD-10-CM | POA: Diagnosis not present

## 2020-08-13 DIAGNOSIS — E039 Hypothyroidism, unspecified: Secondary | ICD-10-CM | POA: Diagnosis not present

## 2020-08-13 DIAGNOSIS — I152 Hypertension secondary to endocrine disorders: Secondary | ICD-10-CM | POA: Diagnosis not present

## 2020-08-13 DIAGNOSIS — I5043 Acute on chronic combined systolic (congestive) and diastolic (congestive) heart failure: Secondary | ICD-10-CM | POA: Diagnosis not present

## 2020-08-13 DIAGNOSIS — E1159 Type 2 diabetes mellitus with other circulatory complications: Secondary | ICD-10-CM | POA: Diagnosis not present

## 2020-08-14 ENCOUNTER — Other Ambulatory Visit: Payer: Self-pay

## 2020-08-14 ENCOUNTER — Telehealth: Payer: Self-pay

## 2020-08-14 ENCOUNTER — Encounter: Payer: Self-pay | Admitting: Physician Assistant

## 2020-08-14 MED ORDER — DILTIAZEM HCL ER COATED BEADS 120 MG PO CP24: 120.0000 mg | ORAL_CAPSULE | Freq: Every day | ORAL | 1 refills | Status: DC

## 2020-08-14 MED ORDER — METOPROLOL SUCCINATE ER 25 MG PO TB24
25.0000 mg | ORAL_TABLET | Freq: Every day | ORAL | 1 refills | Status: DC
Start: 2020-08-14 — End: 2021-01-02

## 2020-08-14 MED ORDER — SPIRONOLACTONE 25 MG PO TABS: 25.0000 mg | ORAL_TABLET | Freq: Every day | ORAL | 1 refills | Status: DC

## 2020-08-14 NOTE — Telephone Encounter (Signed)
Patient's son called that she has not been having regular bowel movements a few days can go by till she does son has been giving her a stool softener and she has been vomiting as well I suggested patient to call PCP but son wanted to mention it to you as well to see if there is anything he can do or change please advise

## 2020-08-14 NOTE — Telephone Encounter (Signed)
Best to contact PCP

## 2020-08-15 DIAGNOSIS — E039 Hypothyroidism, unspecified: Secondary | ICD-10-CM | POA: Diagnosis not present

## 2020-08-15 DIAGNOSIS — I5043 Acute on chronic combined systolic (congestive) and diastolic (congestive) heart failure: Secondary | ICD-10-CM | POA: Diagnosis not present

## 2020-08-15 DIAGNOSIS — I110 Hypertensive heart disease with heart failure: Secondary | ICD-10-CM | POA: Diagnosis not present

## 2020-08-15 DIAGNOSIS — J9601 Acute respiratory failure with hypoxia: Secondary | ICD-10-CM | POA: Diagnosis not present

## 2020-08-15 DIAGNOSIS — E1159 Type 2 diabetes mellitus with other circulatory complications: Secondary | ICD-10-CM | POA: Diagnosis not present

## 2020-08-15 DIAGNOSIS — I152 Hypertension secondary to endocrine disorders: Secondary | ICD-10-CM | POA: Diagnosis not present

## 2020-08-15 NOTE — Telephone Encounter (Signed)
Spoke to patient

## 2020-08-19 DIAGNOSIS — E039 Hypothyroidism, unspecified: Secondary | ICD-10-CM | POA: Diagnosis not present

## 2020-08-19 DIAGNOSIS — I5043 Acute on chronic combined systolic (congestive) and diastolic (congestive) heart failure: Secondary | ICD-10-CM | POA: Diagnosis not present

## 2020-08-19 DIAGNOSIS — E1159 Type 2 diabetes mellitus with other circulatory complications: Secondary | ICD-10-CM | POA: Diagnosis not present

## 2020-08-19 DIAGNOSIS — I152 Hypertension secondary to endocrine disorders: Secondary | ICD-10-CM | POA: Diagnosis not present

## 2020-08-19 DIAGNOSIS — J9601 Acute respiratory failure with hypoxia: Secondary | ICD-10-CM | POA: Diagnosis not present

## 2020-08-19 DIAGNOSIS — I110 Hypertensive heart disease with heart failure: Secondary | ICD-10-CM | POA: Diagnosis not present

## 2020-08-20 ENCOUNTER — Other Ambulatory Visit: Payer: Self-pay

## 2020-08-20 ENCOUNTER — Telehealth: Payer: Self-pay

## 2020-08-20 ENCOUNTER — Ambulatory Visit
Admit: 2020-08-20 | Discharge: 2020-08-20 | Disposition: A | Discharge status: Home / Self Care | Payer: Medicare Other | Attending: Physician Assistant | Admitting: Physician Assistant

## 2020-08-20 DIAGNOSIS — E2839 Other primary ovarian failure: Secondary | ICD-10-CM

## 2020-08-20 DIAGNOSIS — I110 Hypertensive heart disease with heart failure: Secondary | ICD-10-CM | POA: Diagnosis not present

## 2020-08-20 DIAGNOSIS — I5043 Acute on chronic combined systolic (congestive) and diastolic (congestive) heart failure: Secondary | ICD-10-CM | POA: Diagnosis not present

## 2020-08-20 DIAGNOSIS — I152 Hypertension secondary to endocrine disorders: Secondary | ICD-10-CM | POA: Diagnosis not present

## 2020-08-20 DIAGNOSIS — E039 Hypothyroidism, unspecified: Secondary | ICD-10-CM | POA: Diagnosis not present

## 2020-08-20 DIAGNOSIS — E1159 Type 2 diabetes mellitus with other circulatory complications: Secondary | ICD-10-CM | POA: Diagnosis not present

## 2020-08-20 DIAGNOSIS — M8008XS Age-related osteoporosis with current pathological fracture, vertebra(e), sequela: Secondary | ICD-10-CM

## 2020-08-20 DIAGNOSIS — Z79899 Other long term (current) drug therapy: Secondary | ICD-10-CM

## 2020-08-20 DIAGNOSIS — J9601 Acute respiratory failure with hypoxia: Secondary | ICD-10-CM | POA: Diagnosis not present

## 2020-08-20 NOTE — Telephone Encounter (Signed)
Patient son called in and stated they had went to get patients DEXA scan done but the patient couldn't get in on the table. Paitents son wanted to know if there was another place they could get it done at.

## 2020-08-20 NOTE — Telephone Encounter (Signed)
Crystal Stanley, do you know of another place to get Dexa done? Please see pt message.

## 2020-08-21 DIAGNOSIS — I5043 Acute on chronic combined systolic (congestive) and diastolic (congestive) heart failure: Secondary | ICD-10-CM | POA: Diagnosis not present

## 2020-08-21 DIAGNOSIS — I110 Hypertensive heart disease with heart failure: Secondary | ICD-10-CM | POA: Diagnosis not present

## 2020-08-21 DIAGNOSIS — E1159 Type 2 diabetes mellitus with other circulatory complications: Secondary | ICD-10-CM | POA: Diagnosis not present

## 2020-08-21 DIAGNOSIS — J9601 Acute respiratory failure with hypoxia: Secondary | ICD-10-CM | POA: Diagnosis not present

## 2020-08-21 DIAGNOSIS — E039 Hypothyroidism, unspecified: Secondary | ICD-10-CM | POA: Diagnosis not present

## 2020-08-21 DIAGNOSIS — I152 Hypertension secondary to endocrine disorders: Secondary | ICD-10-CM | POA: Diagnosis not present

## 2020-08-22 DIAGNOSIS — I5043 Acute on chronic combined systolic (congestive) and diastolic (congestive) heart failure: Secondary | ICD-10-CM | POA: Diagnosis not present

## 2020-08-22 DIAGNOSIS — E039 Hypothyroidism, unspecified: Secondary | ICD-10-CM | POA: Diagnosis not present

## 2020-08-22 DIAGNOSIS — E1159 Type 2 diabetes mellitus with other circulatory complications: Secondary | ICD-10-CM | POA: Diagnosis not present

## 2020-08-22 DIAGNOSIS — I152 Hypertension secondary to endocrine disorders: Secondary | ICD-10-CM | POA: Diagnosis not present

## 2020-08-22 DIAGNOSIS — J9601 Acute respiratory failure with hypoxia: Secondary | ICD-10-CM | POA: Diagnosis not present

## 2020-08-22 DIAGNOSIS — I110 Hypertensive heart disease with heart failure: Secondary | ICD-10-CM | POA: Diagnosis not present

## 2020-08-23 DIAGNOSIS — E1159 Type 2 diabetes mellitus with other circulatory complications: Secondary | ICD-10-CM | POA: Diagnosis not present

## 2020-08-23 DIAGNOSIS — E039 Hypothyroidism, unspecified: Secondary | ICD-10-CM | POA: Diagnosis not present

## 2020-08-23 DIAGNOSIS — I152 Hypertension secondary to endocrine disorders: Secondary | ICD-10-CM | POA: Diagnosis not present

## 2020-08-23 DIAGNOSIS — I110 Hypertensive heart disease with heart failure: Secondary | ICD-10-CM | POA: Diagnosis not present

## 2020-08-23 DIAGNOSIS — J9601 Acute respiratory failure with hypoxia: Secondary | ICD-10-CM | POA: Diagnosis not present

## 2020-08-23 DIAGNOSIS — I5043 Acute on chronic combined systolic (congestive) and diastolic (congestive) heart failure: Secondary | ICD-10-CM | POA: Diagnosis not present

## 2020-08-23 MED ORDER — POTASSIUM CHLORIDE ER 10 MEQ PO CPCR: 10.0000 meq | ORAL_CAPSULE | Freq: Every day | ORAL | 1 refills | Status: DC

## 2020-08-23 NOTE — Telephone Encounter (Signed)
Spoke to pt's son told him to check with Cardiology to see if she should be on both Potasium and Spironolactone with the Lasix per [name]. Mr.  Lampton verbalized understanding and will call them right now. Told him I will send refill for Potassium to mail order pharmacy but check first wether should continue. Mr. Stonehocker verbalized understanding.

## 2020-08-26 ENCOUNTER — Encounter: Payer: Self-pay | Admitting: Physician Assistant

## 2020-08-26 ENCOUNTER — Other Ambulatory Visit: Payer: Self-pay | Admitting: Physician Assistant

## 2020-08-26 DIAGNOSIS — I5043 Acute on chronic combined systolic (congestive) and diastolic (congestive) heart failure: Secondary | ICD-10-CM | POA: Diagnosis not present

## 2020-08-26 DIAGNOSIS — E1159 Type 2 diabetes mellitus with other circulatory complications: Secondary | ICD-10-CM | POA: Diagnosis not present

## 2020-08-26 DIAGNOSIS — I152 Hypertension secondary to endocrine disorders: Secondary | ICD-10-CM | POA: Diagnosis not present

## 2020-08-26 DIAGNOSIS — J9601 Acute respiratory failure with hypoxia: Secondary | ICD-10-CM | POA: Diagnosis not present

## 2020-08-26 DIAGNOSIS — I110 Hypertensive heart disease with heart failure: Secondary | ICD-10-CM | POA: Diagnosis not present

## 2020-08-26 DIAGNOSIS — E039 Hypothyroidism, unspecified: Secondary | ICD-10-CM | POA: Diagnosis not present

## 2020-08-27 ENCOUNTER — Other Ambulatory Visit: Payer: Self-pay | Admitting: Physician Assistant

## 2020-08-27 DIAGNOSIS — I110 Hypertensive heart disease with heart failure: Secondary | ICD-10-CM | POA: Diagnosis not present

## 2020-08-27 DIAGNOSIS — I5043 Acute on chronic combined systolic (congestive) and diastolic (congestive) heart failure: Secondary | ICD-10-CM | POA: Diagnosis not present

## 2020-08-27 DIAGNOSIS — E1159 Type 2 diabetes mellitus with other circulatory complications: Secondary | ICD-10-CM | POA: Diagnosis not present

## 2020-08-27 DIAGNOSIS — J9601 Acute respiratory failure with hypoxia: Secondary | ICD-10-CM | POA: Diagnosis not present

## 2020-08-27 DIAGNOSIS — I152 Hypertension secondary to endocrine disorders: Secondary | ICD-10-CM | POA: Diagnosis not present

## 2020-08-27 DIAGNOSIS — E039 Hypothyroidism, unspecified: Secondary | ICD-10-CM | POA: Diagnosis not present

## 2020-08-27 MED ORDER — POTASSIUM CHLORIDE 20 MEQ/15ML (10%) PO SOLN: 20.0000 meq | Freq: Every day | ORAL | 2 refills | Status: DC

## 2020-08-29 DIAGNOSIS — I110 Hypertensive heart disease with heart failure: Secondary | ICD-10-CM | POA: Diagnosis not present

## 2020-08-29 DIAGNOSIS — I5043 Acute on chronic combined systolic (congestive) and diastolic (congestive) heart failure: Secondary | ICD-10-CM | POA: Diagnosis not present

## 2020-08-29 DIAGNOSIS — I152 Hypertension secondary to endocrine disorders: Secondary | ICD-10-CM | POA: Diagnosis not present

## 2020-08-29 DIAGNOSIS — E039 Hypothyroidism, unspecified: Secondary | ICD-10-CM | POA: Diagnosis not present

## 2020-08-29 DIAGNOSIS — J9601 Acute respiratory failure with hypoxia: Secondary | ICD-10-CM | POA: Diagnosis not present

## 2020-08-29 DIAGNOSIS — E1159 Type 2 diabetes mellitus with other circulatory complications: Secondary | ICD-10-CM | POA: Diagnosis not present

## 2020-08-30 ENCOUNTER — Other Ambulatory Visit: Payer: Self-pay | Admitting: *Deleted

## 2020-08-30 DIAGNOSIS — J9601 Acute respiratory failure with hypoxia: Secondary | ICD-10-CM | POA: Diagnosis not present

## 2020-08-30 DIAGNOSIS — E039 Hypothyroidism, unspecified: Secondary | ICD-10-CM | POA: Diagnosis not present

## 2020-08-30 DIAGNOSIS — I5043 Acute on chronic combined systolic (congestive) and diastolic (congestive) heart failure: Secondary | ICD-10-CM | POA: Diagnosis not present

## 2020-08-30 DIAGNOSIS — I110 Hypertensive heart disease with heart failure: Secondary | ICD-10-CM | POA: Diagnosis not present

## 2020-08-30 DIAGNOSIS — I152 Hypertension secondary to endocrine disorders: Secondary | ICD-10-CM | POA: Diagnosis not present

## 2020-08-30 DIAGNOSIS — E1159 Type 2 diabetes mellitus with other circulatory complications: Secondary | ICD-10-CM | POA: Diagnosis not present

## 2020-08-30 MED ORDER — POTASSIUM CHLORIDE 20 MEQ/15ML (10%) PO SOLN: 20.0000 meq | Freq: Every day | ORAL | 2 refills | Status: DC

## 2020-09-03 DIAGNOSIS — E039 Hypothyroidism, unspecified: Secondary | ICD-10-CM | POA: Diagnosis not present

## 2020-09-03 DIAGNOSIS — E1159 Type 2 diabetes mellitus with other circulatory complications: Secondary | ICD-10-CM | POA: Diagnosis not present

## 2020-09-03 DIAGNOSIS — I5043 Acute on chronic combined systolic (congestive) and diastolic (congestive) heart failure: Secondary | ICD-10-CM | POA: Diagnosis not present

## 2020-09-03 DIAGNOSIS — I110 Hypertensive heart disease with heart failure: Secondary | ICD-10-CM | POA: Diagnosis not present

## 2020-09-03 DIAGNOSIS — I152 Hypertension secondary to endocrine disorders: Secondary | ICD-10-CM | POA: Diagnosis not present

## 2020-09-03 DIAGNOSIS — J9601 Acute respiratory failure with hypoxia: Secondary | ICD-10-CM | POA: Diagnosis not present

## 2020-09-04 DIAGNOSIS — I110 Hypertensive heart disease with heart failure: Secondary | ICD-10-CM | POA: Diagnosis not present

## 2020-09-04 DIAGNOSIS — E1159 Type 2 diabetes mellitus with other circulatory complications: Secondary | ICD-10-CM | POA: Diagnosis not present

## 2020-09-04 DIAGNOSIS — E039 Hypothyroidism, unspecified: Secondary | ICD-10-CM | POA: Diagnosis not present

## 2020-09-04 DIAGNOSIS — I152 Hypertension secondary to endocrine disorders: Secondary | ICD-10-CM | POA: Diagnosis not present

## 2020-09-04 DIAGNOSIS — J9601 Acute respiratory failure with hypoxia: Secondary | ICD-10-CM | POA: Diagnosis not present

## 2020-09-04 DIAGNOSIS — I5043 Acute on chronic combined systolic (congestive) and diastolic (congestive) heart failure: Secondary | ICD-10-CM | POA: Diagnosis not present

## 2020-09-06 DIAGNOSIS — I152 Hypertension secondary to endocrine disorders: Secondary | ICD-10-CM | POA: Diagnosis not present

## 2020-09-06 DIAGNOSIS — I5043 Acute on chronic combined systolic (congestive) and diastolic (congestive) heart failure: Secondary | ICD-10-CM | POA: Diagnosis not present

## 2020-09-06 DIAGNOSIS — E1159 Type 2 diabetes mellitus with other circulatory complications: Secondary | ICD-10-CM | POA: Diagnosis not present

## 2020-09-06 DIAGNOSIS — I110 Hypertensive heart disease with heart failure: Secondary | ICD-10-CM | POA: Diagnosis not present

## 2020-09-06 DIAGNOSIS — J9601 Acute respiratory failure with hypoxia: Secondary | ICD-10-CM | POA: Diagnosis not present

## 2020-09-06 DIAGNOSIS — E039 Hypothyroidism, unspecified: Secondary | ICD-10-CM | POA: Diagnosis not present

## 2020-09-08 DIAGNOSIS — J811 Chronic pulmonary edema: Secondary | ICD-10-CM | POA: Diagnosis not present

## 2020-09-08 DIAGNOSIS — R32 Unspecified urinary incontinence: Secondary | ICD-10-CM | POA: Diagnosis not present

## 2020-09-08 DIAGNOSIS — M8008XD Age-related osteoporosis with current pathological fracture, vertebra(e), subsequent encounter for fracture with routine healing: Secondary | ICD-10-CM | POA: Diagnosis not present

## 2020-09-08 DIAGNOSIS — Z7902 Long term (current) use of antithrombotics/antiplatelets: Secondary | ICD-10-CM | POA: Diagnosis not present

## 2020-09-08 DIAGNOSIS — E1136 Type 2 diabetes mellitus with diabetic cataract: Secondary | ICD-10-CM | POA: Diagnosis not present

## 2020-09-08 DIAGNOSIS — Z8542 Personal history of malignant neoplasm of other parts of uterus: Secondary | ICD-10-CM | POA: Diagnosis not present

## 2020-09-08 DIAGNOSIS — I5043 Acute on chronic combined systolic (congestive) and diastolic (congestive) heart failure: Secondary | ICD-10-CM | POA: Diagnosis not present

## 2020-09-08 DIAGNOSIS — E1165 Type 2 diabetes mellitus with hyperglycemia: Secondary | ICD-10-CM | POA: Diagnosis not present

## 2020-09-08 DIAGNOSIS — M80059D Age-related osteoporosis with current pathological fracture, unspecified femur, subsequent encounter for fracture with routine healing: Secondary | ICD-10-CM | POA: Diagnosis not present

## 2020-09-08 DIAGNOSIS — E1159 Type 2 diabetes mellitus with other circulatory complications: Secondary | ICD-10-CM | POA: Diagnosis not present

## 2020-09-08 DIAGNOSIS — D649 Anemia, unspecified: Secondary | ICD-10-CM | POA: Diagnosis not present

## 2020-09-08 DIAGNOSIS — E039 Hypothyroidism, unspecified: Secondary | ICD-10-CM | POA: Diagnosis not present

## 2020-09-08 DIAGNOSIS — R1312 Dysphagia, oropharyngeal phase: Secondary | ICD-10-CM | POA: Diagnosis not present

## 2020-09-08 DIAGNOSIS — Z794 Long term (current) use of insulin: Secondary | ICD-10-CM | POA: Diagnosis not present

## 2020-09-08 DIAGNOSIS — Z7984 Long term (current) use of oral hypoglycemic drugs: Secondary | ICD-10-CM | POA: Diagnosis not present

## 2020-09-08 DIAGNOSIS — J9601 Acute respiratory failure with hypoxia: Secondary | ICD-10-CM | POA: Diagnosis not present

## 2020-09-08 DIAGNOSIS — H409 Unspecified glaucoma: Secondary | ICD-10-CM | POA: Diagnosis not present

## 2020-09-08 DIAGNOSIS — I110 Hypertensive heart disease with heart failure: Secondary | ICD-10-CM | POA: Diagnosis not present

## 2020-09-08 DIAGNOSIS — I152 Hypertension secondary to endocrine disorders: Secondary | ICD-10-CM | POA: Diagnosis not present

## 2020-09-09 DIAGNOSIS — I152 Hypertension secondary to endocrine disorders: Secondary | ICD-10-CM | POA: Diagnosis not present

## 2020-09-09 DIAGNOSIS — I110 Hypertensive heart disease with heart failure: Secondary | ICD-10-CM | POA: Diagnosis not present

## 2020-09-09 DIAGNOSIS — E039 Hypothyroidism, unspecified: Secondary | ICD-10-CM | POA: Diagnosis not present

## 2020-09-09 DIAGNOSIS — E1159 Type 2 diabetes mellitus with other circulatory complications: Secondary | ICD-10-CM | POA: Diagnosis not present

## 2020-09-09 DIAGNOSIS — I5043 Acute on chronic combined systolic (congestive) and diastolic (congestive) heart failure: Secondary | ICD-10-CM | POA: Diagnosis not present

## 2020-09-09 DIAGNOSIS — J9601 Acute respiratory failure with hypoxia: Secondary | ICD-10-CM | POA: Diagnosis not present

## 2020-09-10 DIAGNOSIS — I152 Hypertension secondary to endocrine disorders: Secondary | ICD-10-CM | POA: Diagnosis not present

## 2020-09-10 DIAGNOSIS — J9601 Acute respiratory failure with hypoxia: Secondary | ICD-10-CM | POA: Diagnosis not present

## 2020-09-10 DIAGNOSIS — E039 Hypothyroidism, unspecified: Secondary | ICD-10-CM | POA: Diagnosis not present

## 2020-09-10 DIAGNOSIS — E1159 Type 2 diabetes mellitus with other circulatory complications: Secondary | ICD-10-CM | POA: Diagnosis not present

## 2020-09-10 DIAGNOSIS — I110 Hypertensive heart disease with heart failure: Secondary | ICD-10-CM | POA: Diagnosis not present

## 2020-09-10 DIAGNOSIS — I5043 Acute on chronic combined systolic (congestive) and diastolic (congestive) heart failure: Secondary | ICD-10-CM | POA: Diagnosis not present

## 2020-09-11 DIAGNOSIS — I152 Hypertension secondary to endocrine disorders: Secondary | ICD-10-CM | POA: Diagnosis not present

## 2020-09-11 DIAGNOSIS — J9601 Acute respiratory failure with hypoxia: Secondary | ICD-10-CM | POA: Diagnosis not present

## 2020-09-11 DIAGNOSIS — I5043 Acute on chronic combined systolic (congestive) and diastolic (congestive) heart failure: Secondary | ICD-10-CM | POA: Diagnosis not present

## 2020-09-11 DIAGNOSIS — I110 Hypertensive heart disease with heart failure: Secondary | ICD-10-CM | POA: Diagnosis not present

## 2020-09-11 DIAGNOSIS — E1159 Type 2 diabetes mellitus with other circulatory complications: Secondary | ICD-10-CM | POA: Diagnosis not present

## 2020-09-11 DIAGNOSIS — E039 Hypothyroidism, unspecified: Secondary | ICD-10-CM | POA: Diagnosis not present

## 2020-09-12 DIAGNOSIS — E1159 Type 2 diabetes mellitus with other circulatory complications: Secondary | ICD-10-CM | POA: Diagnosis not present

## 2020-09-12 DIAGNOSIS — I110 Hypertensive heart disease with heart failure: Secondary | ICD-10-CM | POA: Diagnosis not present

## 2020-09-12 DIAGNOSIS — J9601 Acute respiratory failure with hypoxia: Secondary | ICD-10-CM | POA: Diagnosis not present

## 2020-09-12 DIAGNOSIS — E039 Hypothyroidism, unspecified: Secondary | ICD-10-CM | POA: Diagnosis not present

## 2020-09-12 DIAGNOSIS — I5043 Acute on chronic combined systolic (congestive) and diastolic (congestive) heart failure: Secondary | ICD-10-CM | POA: Diagnosis not present

## 2020-09-12 DIAGNOSIS — I152 Hypertension secondary to endocrine disorders: Secondary | ICD-10-CM | POA: Diagnosis not present

## 2020-09-16 ENCOUNTER — Telehealth: Payer: Self-pay

## 2020-09-16 DIAGNOSIS — I110 Hypertensive heart disease with heart failure: Secondary | ICD-10-CM | POA: Diagnosis not present

## 2020-09-16 DIAGNOSIS — M25552 Pain in left hip: Secondary | ICD-10-CM

## 2020-09-16 DIAGNOSIS — E039 Hypothyroidism, unspecified: Secondary | ICD-10-CM | POA: Diagnosis not present

## 2020-09-16 DIAGNOSIS — J9601 Acute respiratory failure with hypoxia: Secondary | ICD-10-CM | POA: Diagnosis not present

## 2020-09-16 DIAGNOSIS — I152 Hypertension secondary to endocrine disorders: Secondary | ICD-10-CM | POA: Diagnosis not present

## 2020-09-16 DIAGNOSIS — E1159 Type 2 diabetes mellitus with other circulatory complications: Secondary | ICD-10-CM | POA: Diagnosis not present

## 2020-09-16 DIAGNOSIS — M25562 Pain in left knee: Secondary | ICD-10-CM

## 2020-09-16 DIAGNOSIS — I5043 Acute on chronic combined systolic (congestive) and diastolic (congestive) heart failure: Secondary | ICD-10-CM | POA: Diagnosis not present

## 2020-09-16 NOTE — Telephone Encounter (Signed)
See message. Okay to send referral?

## 2020-09-16 NOTE — Telephone Encounter (Signed)
Spoke to pt's son [name], told him referral for Orthopedics has been placed for pt and someone will contact you to schedule an appt. Prashant verbalized understanding.

## 2020-09-16 NOTE — Telephone Encounter (Signed)
Ok for referral?

## 2020-09-16 NOTE — Telephone Encounter (Signed)
Pt is requesting a referral to an orthopaedic provider for pain and fluid in her left knee and hip.

## 2020-09-17 DIAGNOSIS — E039 Hypothyroidism, unspecified: Secondary | ICD-10-CM | POA: Diagnosis not present

## 2020-09-17 DIAGNOSIS — I110 Hypertensive heart disease with heart failure: Secondary | ICD-10-CM | POA: Diagnosis not present

## 2020-09-17 DIAGNOSIS — J9601 Acute respiratory failure with hypoxia: Secondary | ICD-10-CM | POA: Diagnosis not present

## 2020-09-17 DIAGNOSIS — I152 Hypertension secondary to endocrine disorders: Secondary | ICD-10-CM | POA: Diagnosis not present

## 2020-09-17 DIAGNOSIS — E1159 Type 2 diabetes mellitus with other circulatory complications: Secondary | ICD-10-CM | POA: Diagnosis not present

## 2020-09-17 DIAGNOSIS — I5043 Acute on chronic combined systolic (congestive) and diastolic (congestive) heart failure: Secondary | ICD-10-CM | POA: Diagnosis not present

## 2020-09-18 DIAGNOSIS — E039 Hypothyroidism, unspecified: Secondary | ICD-10-CM | POA: Diagnosis not present

## 2020-09-18 DIAGNOSIS — I152 Hypertension secondary to endocrine disorders: Secondary | ICD-10-CM | POA: Diagnosis not present

## 2020-09-18 DIAGNOSIS — I110 Hypertensive heart disease with heart failure: Secondary | ICD-10-CM | POA: Diagnosis not present

## 2020-09-18 DIAGNOSIS — E1159 Type 2 diabetes mellitus with other circulatory complications: Secondary | ICD-10-CM | POA: Diagnosis not present

## 2020-09-18 DIAGNOSIS — J9601 Acute respiratory failure with hypoxia: Secondary | ICD-10-CM | POA: Diagnosis not present

## 2020-09-18 DIAGNOSIS — I5043 Acute on chronic combined systolic (congestive) and diastolic (congestive) heart failure: Secondary | ICD-10-CM | POA: Diagnosis not present

## 2020-09-19 ENCOUNTER — Encounter: Payer: Self-pay | Admitting: Physician Assistant

## 2020-09-19 ENCOUNTER — Other Ambulatory Visit: Payer: Self-pay

## 2020-09-19 ENCOUNTER — Ambulatory Visit (INDEPENDENT_AMBULATORY_CARE_PROVIDER_SITE_OTHER): Payer: Medicare Other | Admitting: Orthopaedic Surgery

## 2020-09-19 ENCOUNTER — Ambulatory Visit (INDEPENDENT_AMBULATORY_CARE_PROVIDER_SITE_OTHER): Payer: Medicare Other

## 2020-09-19 DIAGNOSIS — M1612 Unilateral primary osteoarthritis, left hip: Secondary | ICD-10-CM

## 2020-09-19 DIAGNOSIS — M1712 Unilateral primary osteoarthritis, left knee: Secondary | ICD-10-CM

## 2020-09-19 NOTE — Progress Notes (Signed)
Office Visit Note   Patient: Crystal Ashley           Date of Birth: 1946-04-29           MRN: [id number] Visit Date: 09/19/2020              Requested by: [name], [place] [address] [place],  [place] [address] PCP: [name], [place]   Assessment & Plan: Visit Diagnoses:  1. Primary osteoarthritis of left hip   2. Primary localized osteoarthritis of left knee     Plan: Impression is left hip and left knee degenerative joint disease.  We have discussed cortisone injections to both areas today.  She is diabetic and Dr. Junius Roads is not here so we will proceed with only 1 injection which will be left knee.  Should her symptoms and her hip continue to bother her or worsen, she will follow up with Dr. Junius Roads for left hip injection.  Follow-up with [place] as needed.  Follow-Up Instructions: Return if symptoms worsen or fail to improve.   Orders:  Orders Placed This Encounter  Procedures   XR HIP UNILAT W OR W/O PELVIS 2-3 VIEWS LEFT   XR KNEE 3 VIEW LEFT   No orders of the defined types were placed in this encounter.     Procedures: Large Joint Inj: L knee on 09/20/2020 9:59 PM Details: 22 G needle Medications: 2 mL bupivacaine 0.5 %; 2 mL lidocaine 1 %; 40 mg methylPREDNISolone acetate 40 MG/ML Outcome: tolerated well, no immediate complications Patient was prepped and draped in the usual sterile fashion.       Clinical Data: No additional findings.   Subjective: Chief Complaint  Patient presents with   Left Hip - Pain   Left Knee - Pain    HPI patient is a pleasant 75 year old female who comes in today with left knee and hip pain.  This began about a week ago.  She primarily ambulates in wheelchair but is able to ambulate with a walker as long as she has help from physical therapy.  Her symptoms started out of the blue after working with therapy this past Friday.  No new injury or change in exercises.  The pain is primarily to the knee but she does endorse  some pain to the left hip when there sitting her up in her hospital bed.  Pain is worse when she is trying to walk.  She has been using heating pad and a pillow as well as applying topical gel without significant relief of symptoms.  She is also tried Tylenol without relief.  Review of Systems as detailed in HPI.  All others reviewed and are negative.   Objective: Vital Signs: There were no vitals taken for this visit.  Physical Exam well-developed well-nourished female no acute distress.  Alert oriented x3.  Ortho Exam left hip shows increased pain with hip flexion.  She has decreased internal rotation with logroll and FADIR but this is with minimal pain.  Minimal tenderness to the greater trochanter.  Left knee shows a small effusion.  Range of motion 0 to 90 degrees.  Medial and lateral joint line tenderness.  Ligaments are stable.  She is neurovascular intact distally.  Specialty Comments:  No specialty comments available.  Imaging: XR HIP UNILAT W OR W/O PELVIS 2-3 VIEWS LEFT  Result Date: 09/19/2020 Moderate joint space narrowing  XR KNEE 3 VIEW LEFT  Result Date: 09/19/2020 Moderate tricompartmental degenerative changes    PMFS History:  Patient Active Problem List   Diagnosis Date Noted   Bilateral pleural effusion 07/21/2020   Pulmonary edema 07/21/2020   Acute on chronic diastolic CHF (congestive heart failure) ([place]) 07/21/2020   Chronic blood loss anemia 07/21/2020   Controlled type 2 diabetes mellitus with hyperglycemia ([place]) 07/21/2020   Hypothyroidism, adult 07/21/2020   Physical deconditioning 07/21/2020   Pressure injury of skin 05/27/2020   SOB (shortness of breath) 05/26/2020   PNA (pneumonia) 05/22/2020   Elective surgery    Fusion of spine, thoracolumbar region 02/26/2020   Acute blood loss anemia    PAF (paroxysmal atrial fibrillation) (HCC)    Controlled type 2 diabetes mellitus with hyperglycemia, without long-term current use of insulin  ([place])    Fracture of vertebra due to osteoporosis ([place])    Acute midline thoracic back pain    Multiple traumatic injuries 02/12/2020   Closed fracture of multiple pubic rami, right, initial encounter ([place]) 02/07/2020   Atrial fibrillation, chronic ([place]) 02/07/2020   Chronic combined systolic (congestive) and diastolic (congestive) heart failure (HCC)    Acute respiratory failure with hypoxia ([place]) 10/18/2018   Declining mobility 09/23/2018   B12 deficiency 09/23/2018   Morbid obesity with BMI of 40.0-44.9, adult ([place]) 09/23/2018   Anemia 09/23/2018   History of cardioversion 04/26/2018   Type 2 diabetes mellitus without complication, without long-term current use of insulin ([place]) 03/15/2018   Atrial fibrillation with RVR ([place]) 03/15/2018   Morbid obesity ([place]) 01/04/2017   Hyperlipidemia associated with type 2 diabetes mellitus ([place]) 01/04/2017   Vitamin D deficiency 12/15/2016   Glaucoma    Hypertension associated with diabetes ([place]) 04/19/2014   Hypothyroidism 04/19/2014   History of endometrial cancer 03/30/2014   Past Medical History:  Diagnosis Date   Atrial fibrillation ([place])    Cancer ([place])    Cataract    Diabetes mellitus without complication ([place])    Fracture 2001   left ankle   Glaucoma    Hypertension    Thyroid disease     Family History  Problem Relation Age of Onset   Diabetes Son    Healthy Mother    Healthy Father     Past Surgical History:  Procedure Laterality Date   APPLICATION OF ROBOTIC ASSISTANCE FOR SPINAL PROCEDURE N/A 02/26/2020   Procedure: APPLICATION OF ROBOTIC ASSISTANCE FOR SPINAL PROCEDURE;  Surgeon: [name], MD;  Location: [facility];  Service: Neurosurgery;  Laterality: N/A;   CARDIOVERSION N/A 04/08/2018   Procedure: CARDIOVERSION;  Surgeon: Adrian Prows, MD;  Location: [facility] ENDOSCOPY;  Service: Cardiovascular;  Laterality: N/A;   CARDIOVERSION N/A 05/22/2019   Procedure: CARDIOVERSION;  Surgeon:  Adrian Prows, MD;  Location: [place];  Service: Cardiovascular;  Laterality: N/A;   FOOT SURGERY Left    HYSTEROSCOPY     POLYPECTOMY   LUMBAR PERCUTANEOUS PEDICLE SCREW 4 LEVEL N/A 02/26/2020   Procedure: Thoracic eight to thoracic twelve posterior percutaneous instrumentation with cement augmentation and bilateral medial facetectomy for decompression at Thoracic ten-eleven;  Surgeon: [name], MD;  Location: [place];  Service: Neurosurgery;  Laterality: N/A;   Social History   Occupational History   Not on file  Tobacco Use   Smoking status: Never Smoker   Smokeless tobacco: Never Used  Vaping Use   Vaping Use: Never used  Substance and Sexual Activity   Alcohol use: No   Drug use: Never   Sexual activity: Yes

## 2020-09-20 DIAGNOSIS — M1712 Unilateral primary osteoarthritis, left knee: Secondary | ICD-10-CM | POA: Diagnosis not present

## 2020-09-20 MED ORDER — BUPIVACAINE HCL 0.5 % IJ SOLN
2.0000 mL | INTRAMUSCULAR | Status: AC | PRN
  Administered 2020-09-20: 2 mL via INTRA_ARTICULAR

## 2020-09-20 MED ORDER — LIDOCAINE HCL 1 % IJ SOLN
2.0000 mL | INTRAMUSCULAR | Status: AC | PRN
Start: 2020-09-20 — End: 2020-09-20
  Administered 2020-09-20: 2 mL

## 2020-09-20 MED ORDER — METHYLPREDNISOLONE ACETATE 40 MG/ML IJ SUSP
40.0000 mg | INTRAMUSCULAR | Status: AC | PRN
  Administered 2020-09-20: 40 mg via INTRA_ARTICULAR

## 2020-09-23 ENCOUNTER — Ambulatory Visit: Payer: Medicare Other | Admitting: Physician Assistant

## 2020-09-23 DIAGNOSIS — I110 Hypertensive heart disease with heart failure: Secondary | ICD-10-CM | POA: Diagnosis not present

## 2020-09-23 DIAGNOSIS — E039 Hypothyroidism, unspecified: Secondary | ICD-10-CM | POA: Diagnosis not present

## 2020-09-23 DIAGNOSIS — J9601 Acute respiratory failure with hypoxia: Secondary | ICD-10-CM | POA: Diagnosis not present

## 2020-09-23 DIAGNOSIS — I152 Hypertension secondary to endocrine disorders: Secondary | ICD-10-CM | POA: Diagnosis not present

## 2020-09-23 DIAGNOSIS — E1159 Type 2 diabetes mellitus with other circulatory complications: Secondary | ICD-10-CM | POA: Diagnosis not present

## 2020-09-23 DIAGNOSIS — I5043 Acute on chronic combined systolic (congestive) and diastolic (congestive) heart failure: Secondary | ICD-10-CM | POA: Diagnosis not present

## 2020-09-25 DIAGNOSIS — J9601 Acute respiratory failure with hypoxia: Secondary | ICD-10-CM | POA: Diagnosis not present

## 2020-09-25 DIAGNOSIS — I152 Hypertension secondary to endocrine disorders: Secondary | ICD-10-CM | POA: Diagnosis not present

## 2020-09-25 DIAGNOSIS — I110 Hypertensive heart disease with heart failure: Secondary | ICD-10-CM | POA: Diagnosis not present

## 2020-09-25 DIAGNOSIS — E1159 Type 2 diabetes mellitus with other circulatory complications: Secondary | ICD-10-CM | POA: Diagnosis not present

## 2020-09-25 DIAGNOSIS — E039 Hypothyroidism, unspecified: Secondary | ICD-10-CM | POA: Diagnosis not present

## 2020-09-25 DIAGNOSIS — I5043 Acute on chronic combined systolic (congestive) and diastolic (congestive) heart failure: Secondary | ICD-10-CM | POA: Diagnosis not present

## 2020-09-26 DIAGNOSIS — I110 Hypertensive heart disease with heart failure: Secondary | ICD-10-CM | POA: Diagnosis not present

## 2020-09-26 DIAGNOSIS — E1159 Type 2 diabetes mellitus with other circulatory complications: Secondary | ICD-10-CM | POA: Diagnosis not present

## 2020-09-26 DIAGNOSIS — I5043 Acute on chronic combined systolic (congestive) and diastolic (congestive) heart failure: Secondary | ICD-10-CM | POA: Diagnosis not present

## 2020-09-26 DIAGNOSIS — J9601 Acute respiratory failure with hypoxia: Secondary | ICD-10-CM | POA: Diagnosis not present

## 2020-09-26 DIAGNOSIS — E039 Hypothyroidism, unspecified: Secondary | ICD-10-CM | POA: Diagnosis not present

## 2020-09-26 DIAGNOSIS — I152 Hypertension secondary to endocrine disorders: Secondary | ICD-10-CM | POA: Diagnosis not present

## 2020-09-30 DIAGNOSIS — J9601 Acute respiratory failure with hypoxia: Secondary | ICD-10-CM | POA: Diagnosis not present

## 2020-09-30 DIAGNOSIS — E1159 Type 2 diabetes mellitus with other circulatory complications: Secondary | ICD-10-CM | POA: Diagnosis not present

## 2020-09-30 DIAGNOSIS — I152 Hypertension secondary to endocrine disorders: Secondary | ICD-10-CM | POA: Diagnosis not present

## 2020-09-30 DIAGNOSIS — I110 Hypertensive heart disease with heart failure: Secondary | ICD-10-CM | POA: Diagnosis not present

## 2020-09-30 DIAGNOSIS — E039 Hypothyroidism, unspecified: Secondary | ICD-10-CM | POA: Diagnosis not present

## 2020-09-30 DIAGNOSIS — I5043 Acute on chronic combined systolic (congestive) and diastolic (congestive) heart failure: Secondary | ICD-10-CM | POA: Diagnosis not present

## 2020-10-03 DIAGNOSIS — E039 Hypothyroidism, unspecified: Secondary | ICD-10-CM | POA: Diagnosis not present

## 2020-10-03 DIAGNOSIS — E1159 Type 2 diabetes mellitus with other circulatory complications: Secondary | ICD-10-CM | POA: Diagnosis not present

## 2020-10-03 DIAGNOSIS — I152 Hypertension secondary to endocrine disorders: Secondary | ICD-10-CM | POA: Diagnosis not present

## 2020-10-03 DIAGNOSIS — I110 Hypertensive heart disease with heart failure: Secondary | ICD-10-CM | POA: Diagnosis not present

## 2020-10-03 DIAGNOSIS — I5043 Acute on chronic combined systolic (congestive) and diastolic (congestive) heart failure: Secondary | ICD-10-CM | POA: Diagnosis not present

## 2020-10-03 DIAGNOSIS — J9601 Acute respiratory failure with hypoxia: Secondary | ICD-10-CM | POA: Diagnosis not present

## 2020-10-04 DIAGNOSIS — I152 Hypertension secondary to endocrine disorders: Secondary | ICD-10-CM | POA: Diagnosis not present

## 2020-10-04 DIAGNOSIS — J9601 Acute respiratory failure with hypoxia: Secondary | ICD-10-CM | POA: Diagnosis not present

## 2020-10-04 DIAGNOSIS — E039 Hypothyroidism, unspecified: Secondary | ICD-10-CM | POA: Diagnosis not present

## 2020-10-04 DIAGNOSIS — I5043 Acute on chronic combined systolic (congestive) and diastolic (congestive) heart failure: Secondary | ICD-10-CM | POA: Diagnosis not present

## 2020-10-04 DIAGNOSIS — I110 Hypertensive heart disease with heart failure: Secondary | ICD-10-CM | POA: Diagnosis not present

## 2020-10-04 DIAGNOSIS — E1159 Type 2 diabetes mellitus with other circulatory complications: Secondary | ICD-10-CM | POA: Diagnosis not present

## 2020-10-04 NOTE — Telephone Encounter (Signed)
error 

## 2020-10-07 ENCOUNTER — Telehealth: Payer: Self-pay

## 2020-10-07 DIAGNOSIS — I152 Hypertension secondary to endocrine disorders: Secondary | ICD-10-CM | POA: Diagnosis not present

## 2020-10-07 DIAGNOSIS — J9601 Acute respiratory failure with hypoxia: Secondary | ICD-10-CM | POA: Diagnosis not present

## 2020-10-07 DIAGNOSIS — I110 Hypertensive heart disease with heart failure: Secondary | ICD-10-CM | POA: Diagnosis not present

## 2020-10-07 DIAGNOSIS — E1159 Type 2 diabetes mellitus with other circulatory complications: Secondary | ICD-10-CM | POA: Diagnosis not present

## 2020-10-07 DIAGNOSIS — I5043 Acute on chronic combined systolic (congestive) and diastolic (congestive) heart failure: Secondary | ICD-10-CM | POA: Diagnosis not present

## 2020-10-07 DIAGNOSIS — E039 Hypothyroidism, unspecified: Secondary | ICD-10-CM | POA: Diagnosis not present

## 2020-10-07 NOTE — Telephone Encounter (Signed)
Left message on voicemail to call office.  

## 2020-10-07 NOTE — Telephone Encounter (Signed)
Home Health Verbal Orders  Agency:  [facility]  Requesting PT  Frequency:  2 week 4 // 1 week 5

## 2020-10-08 DIAGNOSIS — R1312 Dysphagia, oropharyngeal phase: Secondary | ICD-10-CM | POA: Diagnosis not present

## 2020-10-08 DIAGNOSIS — I482 Chronic atrial fibrillation, unspecified: Secondary | ICD-10-CM | POA: Diagnosis not present

## 2020-10-08 DIAGNOSIS — M80059D Age-related osteoporosis with current pathological fracture, unspecified femur, subsequent encounter for fracture with routine healing: Secondary | ICD-10-CM | POA: Diagnosis not present

## 2020-10-08 DIAGNOSIS — D649 Anemia, unspecified: Secondary | ICD-10-CM | POA: Diagnosis not present

## 2020-10-08 DIAGNOSIS — Z7984 Long term (current) use of oral hypoglycemic drugs: Secondary | ICD-10-CM | POA: Diagnosis not present

## 2020-10-08 DIAGNOSIS — Z6841 Body Mass Index (BMI) 40.0 and over, adult: Secondary | ICD-10-CM | POA: Diagnosis not present

## 2020-10-08 DIAGNOSIS — H409 Unspecified glaucoma: Secondary | ICD-10-CM | POA: Diagnosis not present

## 2020-10-08 DIAGNOSIS — I5043 Acute on chronic combined systolic (congestive) and diastolic (congestive) heart failure: Secondary | ICD-10-CM | POA: Diagnosis not present

## 2020-10-08 DIAGNOSIS — Z7901 Long term (current) use of anticoagulants: Secondary | ICD-10-CM | POA: Diagnosis not present

## 2020-10-08 DIAGNOSIS — E1136 Type 2 diabetes mellitus with diabetic cataract: Secondary | ICD-10-CM | POA: Diagnosis not present

## 2020-10-08 DIAGNOSIS — M8008XD Age-related osteoporosis with current pathological fracture, vertebra(e), subsequent encounter for fracture with routine healing: Secondary | ICD-10-CM | POA: Diagnosis not present

## 2020-10-08 DIAGNOSIS — E1159 Type 2 diabetes mellitus with other circulatory complications: Secondary | ICD-10-CM | POA: Diagnosis not present

## 2020-10-08 DIAGNOSIS — Z8542 Personal history of malignant neoplasm of other parts of uterus: Secondary | ICD-10-CM | POA: Diagnosis not present

## 2020-10-08 DIAGNOSIS — H9193 Unspecified hearing loss, bilateral: Secondary | ICD-10-CM | POA: Diagnosis not present

## 2020-10-08 DIAGNOSIS — R32 Unspecified urinary incontinence: Secondary | ICD-10-CM | POA: Diagnosis not present

## 2020-10-08 DIAGNOSIS — E1165 Type 2 diabetes mellitus with hyperglycemia: Secondary | ICD-10-CM | POA: Diagnosis not present

## 2020-10-08 DIAGNOSIS — E039 Hypothyroidism, unspecified: Secondary | ICD-10-CM | POA: Diagnosis not present

## 2020-10-08 DIAGNOSIS — I152 Hypertension secondary to endocrine disorders: Secondary | ICD-10-CM | POA: Diagnosis not present

## 2020-10-08 NOTE — Telephone Encounter (Signed)
Gracee from [facility] called back verbal orders given for PT frequency:  2 x's a week for 4 weeks and 1 x a week for 5 weeks okay per [facility] for pt . Gracee verbalized understanding.

## 2020-10-08 NOTE — Telephone Encounter (Signed)
Left message on voicemail to call office.  

## 2020-10-10 DIAGNOSIS — Z8542 Personal history of malignant neoplasm of other parts of uterus: Secondary | ICD-10-CM | POA: Diagnosis not present

## 2020-10-10 DIAGNOSIS — I5043 Acute on chronic combined systolic (congestive) and diastolic (congestive) heart failure: Secondary | ICD-10-CM | POA: Diagnosis not present

## 2020-10-10 DIAGNOSIS — I152 Hypertension secondary to endocrine disorders: Secondary | ICD-10-CM | POA: Diagnosis not present

## 2020-10-10 DIAGNOSIS — I482 Chronic atrial fibrillation, unspecified: Secondary | ICD-10-CM | POA: Diagnosis not present

## 2020-10-10 DIAGNOSIS — E1159 Type 2 diabetes mellitus with other circulatory complications: Secondary | ICD-10-CM | POA: Diagnosis not present

## 2020-10-10 DIAGNOSIS — E039 Hypothyroidism, unspecified: Secondary | ICD-10-CM | POA: Diagnosis not present

## 2020-10-12 DIAGNOSIS — Z8542 Personal history of malignant neoplasm of other parts of uterus: Secondary | ICD-10-CM | POA: Diagnosis not present

## 2020-10-12 DIAGNOSIS — I482 Chronic atrial fibrillation, unspecified: Secondary | ICD-10-CM | POA: Diagnosis not present

## 2020-10-12 DIAGNOSIS — E1159 Type 2 diabetes mellitus with other circulatory complications: Secondary | ICD-10-CM | POA: Diagnosis not present

## 2020-10-12 DIAGNOSIS — I5043 Acute on chronic combined systolic (congestive) and diastolic (congestive) heart failure: Secondary | ICD-10-CM | POA: Diagnosis not present

## 2020-10-12 DIAGNOSIS — I152 Hypertension secondary to endocrine disorders: Secondary | ICD-10-CM | POA: Diagnosis not present

## 2020-10-12 DIAGNOSIS — E039 Hypothyroidism, unspecified: Secondary | ICD-10-CM | POA: Diagnosis not present

## 2020-10-14 ENCOUNTER — Encounter: Payer: Self-pay | Admitting: Physician Assistant

## 2020-10-14 ENCOUNTER — Telehealth: Payer: Self-pay | Admitting: Physician Assistant

## 2020-10-14 DIAGNOSIS — E039 Hypothyroidism, unspecified: Secondary | ICD-10-CM | POA: Diagnosis not present

## 2020-10-14 DIAGNOSIS — R5383 Other fatigue: Secondary | ICD-10-CM

## 2020-10-14 DIAGNOSIS — I482 Chronic atrial fibrillation, unspecified: Secondary | ICD-10-CM | POA: Diagnosis not present

## 2020-10-14 DIAGNOSIS — I152 Hypertension secondary to endocrine disorders: Secondary | ICD-10-CM | POA: Diagnosis not present

## 2020-10-14 DIAGNOSIS — Z8542 Personal history of malignant neoplasm of other parts of uterus: Secondary | ICD-10-CM | POA: Diagnosis not present

## 2020-10-14 DIAGNOSIS — E1159 Type 2 diabetes mellitus with other circulatory complications: Secondary | ICD-10-CM

## 2020-10-14 DIAGNOSIS — I5033 Acute on chronic diastolic (congestive) heart failure: Secondary | ICD-10-CM

## 2020-10-14 DIAGNOSIS — I5043 Acute on chronic combined systolic (congestive) and diastolic (congestive) heart failure: Secondary | ICD-10-CM | POA: Diagnosis not present

## 2020-10-14 NOTE — Telephone Encounter (Signed)
Left message for patient to call back and schedule Medicare Annual Wellness Visit (AWV) either virtually OR in office.   No hx; please schedule at anytime with [facility]-Nurse Health Advisor at [facility].  This should be a 45 minute visit.   

## 2020-10-15 DIAGNOSIS — Z8542 Personal history of malignant neoplasm of other parts of uterus: Secondary | ICD-10-CM | POA: Diagnosis not present

## 2020-10-15 DIAGNOSIS — I152 Hypertension secondary to endocrine disorders: Secondary | ICD-10-CM | POA: Diagnosis not present

## 2020-10-15 DIAGNOSIS — E1159 Type 2 diabetes mellitus with other circulatory complications: Secondary | ICD-10-CM | POA: Diagnosis not present

## 2020-10-15 DIAGNOSIS — I482 Chronic atrial fibrillation, unspecified: Secondary | ICD-10-CM | POA: Diagnosis not present

## 2020-10-15 DIAGNOSIS — I5043 Acute on chronic combined systolic (congestive) and diastolic (congestive) heart failure: Secondary | ICD-10-CM | POA: Diagnosis not present

## 2020-10-15 DIAGNOSIS — E039 Hypothyroidism, unspecified: Secondary | ICD-10-CM | POA: Diagnosis not present

## 2020-10-15 NOTE — Telephone Encounter (Signed)
Please see message and advise what diagnosis to use.

## 2020-10-16 ENCOUNTER — Telehealth: Payer: Self-pay

## 2020-10-16 DIAGNOSIS — E1159 Type 2 diabetes mellitus with other circulatory complications: Secondary | ICD-10-CM | POA: Diagnosis not present

## 2020-10-16 DIAGNOSIS — Z8542 Personal history of malignant neoplasm of other parts of uterus: Secondary | ICD-10-CM | POA: Diagnosis not present

## 2020-10-16 DIAGNOSIS — E039 Hypothyroidism, unspecified: Secondary | ICD-10-CM | POA: Diagnosis not present

## 2020-10-16 DIAGNOSIS — I482 Chronic atrial fibrillation, unspecified: Secondary | ICD-10-CM | POA: Diagnosis not present

## 2020-10-16 DIAGNOSIS — I5043 Acute on chronic combined systolic (congestive) and diastolic (congestive) heart failure: Secondary | ICD-10-CM | POA: Diagnosis not present

## 2020-10-16 DIAGNOSIS — I152 Hypertension secondary to endocrine disorders: Secondary | ICD-10-CM | POA: Diagnosis not present

## 2020-10-16 NOTE — Telephone Encounter (Signed)
OK to transfer 

## 2020-10-16 NOTE — Telephone Encounter (Signed)
Pt son called asking if pt could transfer care from [facility] to Dr. Jerline Pain. Ok to transfer? Please advise.

## 2020-10-18 DIAGNOSIS — Z8542 Personal history of malignant neoplasm of other parts of uterus: Secondary | ICD-10-CM | POA: Diagnosis not present

## 2020-10-18 DIAGNOSIS — E1159 Type 2 diabetes mellitus with other circulatory complications: Secondary | ICD-10-CM | POA: Diagnosis not present

## 2020-10-18 DIAGNOSIS — I5043 Acute on chronic combined systolic (congestive) and diastolic (congestive) heart failure: Secondary | ICD-10-CM | POA: Diagnosis not present

## 2020-10-18 DIAGNOSIS — I482 Chronic atrial fibrillation, unspecified: Secondary | ICD-10-CM | POA: Diagnosis not present

## 2020-10-18 DIAGNOSIS — I152 Hypertension secondary to endocrine disorders: Secondary | ICD-10-CM | POA: Diagnosis not present

## 2020-10-18 DIAGNOSIS — E039 Hypothyroidism, unspecified: Secondary | ICD-10-CM | POA: Diagnosis not present

## 2020-10-19 NOTE — Telephone Encounter (Signed)
[place] with me.  Algis Greenhouse. Jerline Pain, MD 10/19/2020 11:04 AM

## 2020-10-19 NOTE — Telephone Encounter (Signed)
Ok to transfer per Dr Jerline Pain

## 2020-10-21 DIAGNOSIS — Z8542 Personal history of malignant neoplasm of other parts of uterus: Secondary | ICD-10-CM | POA: Diagnosis not present

## 2020-10-21 DIAGNOSIS — E1159 Type 2 diabetes mellitus with other circulatory complications: Secondary | ICD-10-CM | POA: Diagnosis not present

## 2020-10-21 DIAGNOSIS — I152 Hypertension secondary to endocrine disorders: Secondary | ICD-10-CM | POA: Diagnosis not present

## 2020-10-21 DIAGNOSIS — E039 Hypothyroidism, unspecified: Secondary | ICD-10-CM | POA: Diagnosis not present

## 2020-10-21 DIAGNOSIS — I5043 Acute on chronic combined systolic (congestive) and diastolic (congestive) heart failure: Secondary | ICD-10-CM | POA: Diagnosis not present

## 2020-10-21 DIAGNOSIS — I482 Chronic atrial fibrillation, unspecified: Secondary | ICD-10-CM | POA: Diagnosis not present

## 2020-10-21 NOTE — Telephone Encounter (Signed)
Pt scheduled with Dr. Jerline Pain for [facility]

## 2020-10-22 DIAGNOSIS — Z8542 Personal history of malignant neoplasm of other parts of uterus: Secondary | ICD-10-CM | POA: Diagnosis not present

## 2020-10-22 DIAGNOSIS — I152 Hypertension secondary to endocrine disorders: Secondary | ICD-10-CM | POA: Diagnosis not present

## 2020-10-22 DIAGNOSIS — I482 Chronic atrial fibrillation, unspecified: Secondary | ICD-10-CM | POA: Diagnosis not present

## 2020-10-22 DIAGNOSIS — E039 Hypothyroidism, unspecified: Secondary | ICD-10-CM | POA: Diagnosis not present

## 2020-10-22 DIAGNOSIS — I5043 Acute on chronic combined systolic (congestive) and diastolic (congestive) heart failure: Secondary | ICD-10-CM | POA: Diagnosis not present

## 2020-10-22 DIAGNOSIS — E1159 Type 2 diabetes mellitus with other circulatory complications: Secondary | ICD-10-CM | POA: Diagnosis not present

## 2020-10-23 DIAGNOSIS — E039 Hypothyroidism, unspecified: Secondary | ICD-10-CM | POA: Diagnosis not present

## 2020-10-23 DIAGNOSIS — Z8542 Personal history of malignant neoplasm of other parts of uterus: Secondary | ICD-10-CM | POA: Diagnosis not present

## 2020-10-23 DIAGNOSIS — I5043 Acute on chronic combined systolic (congestive) and diastolic (congestive) heart failure: Secondary | ICD-10-CM | POA: Diagnosis not present

## 2020-10-23 DIAGNOSIS — E1159 Type 2 diabetes mellitus with other circulatory complications: Secondary | ICD-10-CM | POA: Diagnosis not present

## 2020-10-23 DIAGNOSIS — I482 Chronic atrial fibrillation, unspecified: Secondary | ICD-10-CM | POA: Diagnosis not present

## 2020-10-23 DIAGNOSIS — I152 Hypertension secondary to endocrine disorders: Secondary | ICD-10-CM | POA: Diagnosis not present

## 2020-10-24 DIAGNOSIS — E1159 Type 2 diabetes mellitus with other circulatory complications: Secondary | ICD-10-CM | POA: Diagnosis not present

## 2020-10-24 DIAGNOSIS — I482 Chronic atrial fibrillation, unspecified: Secondary | ICD-10-CM | POA: Diagnosis not present

## 2020-10-24 DIAGNOSIS — I152 Hypertension secondary to endocrine disorders: Secondary | ICD-10-CM | POA: Diagnosis not present

## 2020-10-24 DIAGNOSIS — I5043 Acute on chronic combined systolic (congestive) and diastolic (congestive) heart failure: Secondary | ICD-10-CM | POA: Diagnosis not present

## 2020-10-24 DIAGNOSIS — Z8542 Personal history of malignant neoplasm of other parts of uterus: Secondary | ICD-10-CM | POA: Diagnosis not present

## 2020-10-24 DIAGNOSIS — E039 Hypothyroidism, unspecified: Secondary | ICD-10-CM | POA: Diagnosis not present

## 2020-10-28 DIAGNOSIS — I482 Chronic atrial fibrillation, unspecified: Secondary | ICD-10-CM | POA: Diagnosis not present

## 2020-10-28 DIAGNOSIS — E1159 Type 2 diabetes mellitus with other circulatory complications: Secondary | ICD-10-CM | POA: Diagnosis not present

## 2020-10-28 DIAGNOSIS — E039 Hypothyroidism, unspecified: Secondary | ICD-10-CM | POA: Diagnosis not present

## 2020-10-28 DIAGNOSIS — Z8542 Personal history of malignant neoplasm of other parts of uterus: Secondary | ICD-10-CM | POA: Diagnosis not present

## 2020-10-28 DIAGNOSIS — I152 Hypertension secondary to endocrine disorders: Secondary | ICD-10-CM | POA: Diagnosis not present

## 2020-10-28 DIAGNOSIS — I5043 Acute on chronic combined systolic (congestive) and diastolic (congestive) heart failure: Secondary | ICD-10-CM | POA: Diagnosis not present

## 2020-10-29 DIAGNOSIS — I152 Hypertension secondary to endocrine disorders: Secondary | ICD-10-CM | POA: Diagnosis not present

## 2020-10-29 DIAGNOSIS — E039 Hypothyroidism, unspecified: Secondary | ICD-10-CM | POA: Diagnosis not present

## 2020-10-29 DIAGNOSIS — E1159 Type 2 diabetes mellitus with other circulatory complications: Secondary | ICD-10-CM | POA: Diagnosis not present

## 2020-10-29 DIAGNOSIS — I5043 Acute on chronic combined systolic (congestive) and diastolic (congestive) heart failure: Secondary | ICD-10-CM | POA: Diagnosis not present

## 2020-10-29 DIAGNOSIS — Z8542 Personal history of malignant neoplasm of other parts of uterus: Secondary | ICD-10-CM | POA: Diagnosis not present

## 2020-10-29 DIAGNOSIS — I482 Chronic atrial fibrillation, unspecified: Secondary | ICD-10-CM | POA: Diagnosis not present

## 2020-10-30 DIAGNOSIS — I5043 Acute on chronic combined systolic (congestive) and diastolic (congestive) heart failure: Secondary | ICD-10-CM | POA: Diagnosis not present

## 2020-10-30 DIAGNOSIS — E1159 Type 2 diabetes mellitus with other circulatory complications: Secondary | ICD-10-CM | POA: Diagnosis not present

## 2020-10-30 DIAGNOSIS — I152 Hypertension secondary to endocrine disorders: Secondary | ICD-10-CM | POA: Diagnosis not present

## 2020-10-30 DIAGNOSIS — I482 Chronic atrial fibrillation, unspecified: Secondary | ICD-10-CM | POA: Diagnosis not present

## 2020-10-30 DIAGNOSIS — E039 Hypothyroidism, unspecified: Secondary | ICD-10-CM | POA: Diagnosis not present

## 2020-10-30 DIAGNOSIS — Z8542 Personal history of malignant neoplasm of other parts of uterus: Secondary | ICD-10-CM | POA: Diagnosis not present

## 2020-10-31 DIAGNOSIS — I482 Chronic atrial fibrillation, unspecified: Secondary | ICD-10-CM | POA: Diagnosis not present

## 2020-10-31 DIAGNOSIS — I152 Hypertension secondary to endocrine disorders: Secondary | ICD-10-CM | POA: Diagnosis not present

## 2020-10-31 DIAGNOSIS — Z8542 Personal history of malignant neoplasm of other parts of uterus: Secondary | ICD-10-CM | POA: Diagnosis not present

## 2020-10-31 DIAGNOSIS — E1159 Type 2 diabetes mellitus with other circulatory complications: Secondary | ICD-10-CM | POA: Diagnosis not present

## 2020-10-31 DIAGNOSIS — E039 Hypothyroidism, unspecified: Secondary | ICD-10-CM | POA: Diagnosis not present

## 2020-10-31 DIAGNOSIS — I5043 Acute on chronic combined systolic (congestive) and diastolic (congestive) heart failure: Secondary | ICD-10-CM | POA: Diagnosis not present

## 2020-11-04 DIAGNOSIS — E1159 Type 2 diabetes mellitus with other circulatory complications: Secondary | ICD-10-CM | POA: Diagnosis not present

## 2020-11-04 DIAGNOSIS — E039 Hypothyroidism, unspecified: Secondary | ICD-10-CM | POA: Diagnosis not present

## 2020-11-04 DIAGNOSIS — I482 Chronic atrial fibrillation, unspecified: Secondary | ICD-10-CM | POA: Diagnosis not present

## 2020-11-04 DIAGNOSIS — I5043 Acute on chronic combined systolic (congestive) and diastolic (congestive) heart failure: Secondary | ICD-10-CM | POA: Diagnosis not present

## 2020-11-04 DIAGNOSIS — Z8542 Personal history of malignant neoplasm of other parts of uterus: Secondary | ICD-10-CM | POA: Diagnosis not present

## 2020-11-04 DIAGNOSIS — I152 Hypertension secondary to endocrine disorders: Secondary | ICD-10-CM | POA: Diagnosis not present

## 2020-11-05 ENCOUNTER — Ambulatory Visit: Payer: Medicare Other | Admitting: Physician Assistant

## 2020-11-06 ENCOUNTER — Ambulatory Visit: Payer: Medicare Other | Admitting: Cardiology

## 2020-11-06 DIAGNOSIS — I152 Hypertension secondary to endocrine disorders: Secondary | ICD-10-CM | POA: Diagnosis not present

## 2020-11-06 DIAGNOSIS — E039 Hypothyroidism, unspecified: Secondary | ICD-10-CM | POA: Diagnosis not present

## 2020-11-06 DIAGNOSIS — E1159 Type 2 diabetes mellitus with other circulatory complications: Secondary | ICD-10-CM | POA: Diagnosis not present

## 2020-11-06 DIAGNOSIS — I482 Chronic atrial fibrillation, unspecified: Secondary | ICD-10-CM | POA: Diagnosis not present

## 2020-11-06 DIAGNOSIS — Z8542 Personal history of malignant neoplasm of other parts of uterus: Secondary | ICD-10-CM | POA: Diagnosis not present

## 2020-11-06 DIAGNOSIS — I5043 Acute on chronic combined systolic (congestive) and diastolic (congestive) heart failure: Secondary | ICD-10-CM | POA: Diagnosis not present

## 2020-11-07 DIAGNOSIS — E1159 Type 2 diabetes mellitus with other circulatory complications: Secondary | ICD-10-CM | POA: Diagnosis not present

## 2020-11-07 DIAGNOSIS — R1312 Dysphagia, oropharyngeal phase: Secondary | ICD-10-CM | POA: Diagnosis not present

## 2020-11-07 DIAGNOSIS — R32 Unspecified urinary incontinence: Secondary | ICD-10-CM | POA: Diagnosis not present

## 2020-11-07 DIAGNOSIS — M8008XD Age-related osteoporosis with current pathological fracture, vertebra(e), subsequent encounter for fracture with routine healing: Secondary | ICD-10-CM | POA: Diagnosis not present

## 2020-11-07 DIAGNOSIS — E1136 Type 2 diabetes mellitus with diabetic cataract: Secondary | ICD-10-CM | POA: Diagnosis not present

## 2020-11-07 DIAGNOSIS — I482 Chronic atrial fibrillation, unspecified: Secondary | ICD-10-CM | POA: Diagnosis not present

## 2020-11-07 DIAGNOSIS — I152 Hypertension secondary to endocrine disorders: Secondary | ICD-10-CM | POA: Diagnosis not present

## 2020-11-07 DIAGNOSIS — Z6841 Body Mass Index (BMI) 40.0 and over, adult: Secondary | ICD-10-CM | POA: Diagnosis not present

## 2020-11-07 DIAGNOSIS — E1165 Type 2 diabetes mellitus with hyperglycemia: Secondary | ICD-10-CM | POA: Diagnosis not present

## 2020-11-07 DIAGNOSIS — I5043 Acute on chronic combined systolic (congestive) and diastolic (congestive) heart failure: Secondary | ICD-10-CM | POA: Diagnosis not present

## 2020-11-07 DIAGNOSIS — E039 Hypothyroidism, unspecified: Secondary | ICD-10-CM | POA: Diagnosis not present

## 2020-11-07 DIAGNOSIS — D649 Anemia, unspecified: Secondary | ICD-10-CM | POA: Diagnosis not present

## 2020-11-07 DIAGNOSIS — M80059D Age-related osteoporosis with current pathological fracture, unspecified femur, subsequent encounter for fracture with routine healing: Secondary | ICD-10-CM | POA: Diagnosis not present

## 2020-11-07 DIAGNOSIS — Z7901 Long term (current) use of anticoagulants: Secondary | ICD-10-CM | POA: Diagnosis not present

## 2020-11-07 DIAGNOSIS — H409 Unspecified glaucoma: Secondary | ICD-10-CM | POA: Diagnosis not present

## 2020-11-07 DIAGNOSIS — H9193 Unspecified hearing loss, bilateral: Secondary | ICD-10-CM | POA: Diagnosis not present

## 2020-11-07 DIAGNOSIS — Z8542 Personal history of malignant neoplasm of other parts of uterus: Secondary | ICD-10-CM | POA: Diagnosis not present

## 2020-11-07 DIAGNOSIS — Z7984 Long term (current) use of oral hypoglycemic drugs: Secondary | ICD-10-CM | POA: Diagnosis not present

## 2020-11-08 DIAGNOSIS — S22078D Other fracture of T9-T10 vertebra, subsequent encounter for fracture with routine healing: Secondary | ICD-10-CM | POA: Diagnosis not present

## 2020-11-11 DIAGNOSIS — E1159 Type 2 diabetes mellitus with other circulatory complications: Secondary | ICD-10-CM | POA: Diagnosis not present

## 2020-11-11 DIAGNOSIS — I5043 Acute on chronic combined systolic (congestive) and diastolic (congestive) heart failure: Secondary | ICD-10-CM | POA: Diagnosis not present

## 2020-11-11 DIAGNOSIS — E039 Hypothyroidism, unspecified: Secondary | ICD-10-CM | POA: Diagnosis not present

## 2020-11-11 DIAGNOSIS — I152 Hypertension secondary to endocrine disorders: Secondary | ICD-10-CM | POA: Diagnosis not present

## 2020-11-11 DIAGNOSIS — Z8542 Personal history of malignant neoplasm of other parts of uterus: Secondary | ICD-10-CM | POA: Diagnosis not present

## 2020-11-11 DIAGNOSIS — I482 Chronic atrial fibrillation, unspecified: Secondary | ICD-10-CM | POA: Diagnosis not present

## 2020-11-13 ENCOUNTER — Other Ambulatory Visit: Payer: Self-pay | Admitting: Physician Assistant

## 2020-11-13 DIAGNOSIS — I482 Chronic atrial fibrillation, unspecified: Secondary | ICD-10-CM

## 2020-11-13 DIAGNOSIS — E119 Type 2 diabetes mellitus without complications: Secondary | ICD-10-CM

## 2020-11-13 DIAGNOSIS — E039 Hypothyroidism, unspecified: Secondary | ICD-10-CM

## 2020-11-13 NOTE — Telephone Encounter (Signed)
Spoke to pt's son [name], told him I have lab orders ready and printed out but I do not know how to get an order to draw labs. [name] said he will contact [facility] and find out how to get order and get back to me.

## 2020-11-14 DIAGNOSIS — E1159 Type 2 diabetes mellitus with other circulatory complications: Secondary | ICD-10-CM | POA: Diagnosis not present

## 2020-11-14 DIAGNOSIS — I482 Chronic atrial fibrillation, unspecified: Secondary | ICD-10-CM | POA: Diagnosis not present

## 2020-11-14 DIAGNOSIS — Z8542 Personal history of malignant neoplasm of other parts of uterus: Secondary | ICD-10-CM | POA: Diagnosis not present

## 2020-11-14 DIAGNOSIS — E039 Hypothyroidism, unspecified: Secondary | ICD-10-CM | POA: Diagnosis not present

## 2020-11-14 DIAGNOSIS — I152 Hypertension secondary to endocrine disorders: Secondary | ICD-10-CM | POA: Diagnosis not present

## 2020-11-14 DIAGNOSIS — I5043 Acute on chronic combined systolic (congestive) and diastolic (congestive) heart failure: Secondary | ICD-10-CM | POA: Diagnosis not present

## 2020-11-16 ENCOUNTER — Other Ambulatory Visit: Payer: Self-pay | Admitting: Physician Assistant

## 2020-11-18 DIAGNOSIS — E039 Hypothyroidism, unspecified: Secondary | ICD-10-CM | POA: Diagnosis not present

## 2020-11-18 DIAGNOSIS — Z8542 Personal history of malignant neoplasm of other parts of uterus: Secondary | ICD-10-CM | POA: Diagnosis not present

## 2020-11-18 DIAGNOSIS — I482 Chronic atrial fibrillation, unspecified: Secondary | ICD-10-CM | POA: Diagnosis not present

## 2020-11-18 DIAGNOSIS — E1159 Type 2 diabetes mellitus with other circulatory complications: Secondary | ICD-10-CM | POA: Diagnosis not present

## 2020-11-18 DIAGNOSIS — I152 Hypertension secondary to endocrine disorders: Secondary | ICD-10-CM | POA: Diagnosis not present

## 2020-11-18 DIAGNOSIS — I5043 Acute on chronic combined systolic (congestive) and diastolic (congestive) heart failure: Secondary | ICD-10-CM | POA: Diagnosis not present

## 2020-11-19 ENCOUNTER — Other Ambulatory Visit: Payer: Self-pay

## 2020-11-19 ENCOUNTER — Encounter: Payer: Self-pay | Admitting: Internal Medicine

## 2020-11-19 ENCOUNTER — Telehealth: Payer: Self-pay

## 2020-11-19 ENCOUNTER — Ambulatory Visit (INDEPENDENT_AMBULATORY_CARE_PROVIDER_SITE_OTHER): Payer: Medicare Other | Admitting: Internal Medicine

## 2020-11-19 VITALS — BP 112/60 | HR 110 | Ht 60.0 in

## 2020-11-19 DIAGNOSIS — I10 Essential (primary) hypertension: Secondary | ICD-10-CM | POA: Diagnosis not present

## 2020-11-19 DIAGNOSIS — E119 Type 2 diabetes mellitus without complications: Secondary | ICD-10-CM

## 2020-11-19 DIAGNOSIS — I509 Heart failure, unspecified: Secondary | ICD-10-CM | POA: Insufficient documentation

## 2020-11-19 DIAGNOSIS — E785 Hyperlipidemia, unspecified: Secondary | ICD-10-CM | POA: Diagnosis not present

## 2020-11-19 DIAGNOSIS — I4821 Permanent atrial fibrillation: Secondary | ICD-10-CM

## 2020-11-19 MED ORDER — FUROSEMIDE 40 MG PO TABS: 40.0000 mg | ORAL_TABLET | Freq: Every day | ORAL | 3 refills | Status: DC

## 2020-11-19 NOTE — Patient Instructions (Signed)
Medication Instructions:  Your physician recommends that you continue on your current medications as directed. Please refer to the Current Medication list given to you today.  *If you need a refill on your cardiac medications before your next appointment, please call your pharmacy*   Lab Work: TODAY: BNP, CBC, CMP, and TSH If you have labs (blood work) drawn today and your tests are completely normal, you will receive your results only by: Marland Kitchen MyChart Message (if you have MyChart) OR . A paper copy in the mail If you have any lab test that is abnormal or we need to change your treatment, we will call you to review the results.   Testing/Procedures: NONE   Follow-Up: At [facility], you and your health needs are our priority.  As part of our continuing mission to provide you with exceptional heart care, we have created designated Provider Care Teams.  These Care Teams include your primary Cardiologist (physician) and Advanced Practice Providers (APPs -  Physician Assistants and Nurse Practitioners) who all work together to provide you with the care you need, when you need it.  We recommend signing up for the patient portal called "MyChart".  Sign up information is provided on this After Visit Summary.  MyChart is used to connect with patients for Virtual Visits (Telemedicine).  Patients are able to view lab/test results, encounter notes, upcoming appointments, etc.  Non-urgent messages can be sent to your provider as well.   To learn more about what you can do with MyChart, go to [email or web address].    Your next appointment:   6-7 month(s)  The format for your next appointment:   In Person  Provider:   You may see [name], MD or one of the following Advanced Practice Providers on your designated Care Team:    Melina Copa, PA-C  Ermalinda Barrios, PA-C

## 2020-11-19 NOTE — Telephone Encounter (Signed)
Agree with recommendation, call patient's son and see if he can ask cardiology about this.

## 2020-11-19 NOTE — Progress Notes (Signed)
Cardiology Office Note:    Date:  11/19/2020   ID:  Crystal Ashley, DOB [birth year], MRN [id number]  PCP:  [name], [place]  Cardiologist:  [name], MD  Formerly Dr. Einar Gip Advanced Practice Provider:  No care team member to display Electrophysiologist:  None       CC: Transition of care Consulted for the evaluation of AF at the [facility] [place], [place], [place]  History of Present Illness:    Crystal Ashley is a 75 y.o. 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  Oncological History notable for: Malignancies: Urterine and endometrial cancer  Surgery: 2015 hysterectomy Chemotherapy: NA Cessations for Toxicity: NA Radiation: NA  Patient notes that she is feeling good.  Has had no chest pain, chest pressure, chest tightness, chest stinging .  Patient exertion notable for minimal activity for years; most of her time is spent at home with family or watching [place] movies.  No shortness of breath, DOE.  No PND or orthopnea.  No bendopnea, weight gain, leg swelling , or abdominal swelling.  Son notes that she has started using compression stockings and has improved her leg swelling. No syncope or near syncope . Notes  no palpitations or funny heart beats- she has had AF for years.  Has had one successful DCCV in the past, and has had failed AAD (amiodarone nausea and failed DCCV) since.  Has swallowing issues but with speech, OT and PT and home health has made improvements.  Her deceased husband was a cardiologist.  Ambulatory BP 110/60.   Past Medical History:  Diagnosis Date  . Atrial fibrillation ([place])   . Cancer ([place])   . Cataract   . Diabetes mellitus without complication ([place])   . Fracture 2001   left ankle  . Glaucoma   . Hypertension   . Thyroid disease     Past Surgical History:  Procedure Laterality Date  . APPLICATION OF ROBOTIC ASSISTANCE FOR  SPINAL PROCEDURE N/A 02/26/2020   Procedure: APPLICATION OF ROBOTIC ASSISTANCE FOR SPINAL PROCEDURE;  Surgeon: [name], MD;  Location: [facility];  Service: Neurosurgery;  Laterality: N/A;  . CARDIOVERSION N/A 04/08/2018   Procedure: CARDIOVERSION;  Surgeon: Adrian Prows, MD;  Location: [facility] ENDOSCOPY;  Service: Cardiovascular;  Laterality: N/A;  . CARDIOVERSION N/A 05/22/2019   Procedure: CARDIOVERSION;  Surgeon: Adrian Prows, MD;  Location: [facility];  Service: Cardiovascular;  Laterality: N/A;  . FOOT SURGERY Left   . HYSTEROSCOPY     POLYPECTOMY  . LUMBAR PERCUTANEOUS PEDICLE SCREW 4 LEVEL N/A 02/26/2020   Procedure: Thoracic eight to thoracic twelve posterior percutaneous instrumentation with cement augmentation and bilateral medial facetectomy for decompression at Thoracic ten-eleven;  Surgeon: [name], MD;  Location: [facility];  Service: Neurosurgery;  Laterality: N/A;    Current Medications: Current Meds  Medication Sig  . atorvastatin (LIPITOR) 10 MG tablet Take 1 tablet (10 mg total) by mouth daily.  . Cholecalciferol (VITAMIN D3 GUMMIES ADULT PO) Take 2 tablets by mouth daily in the afternoon.  . docusate sodium (COLACE) 100 MG capsule Take 1 capsule (100 mg total) by mouth 2 (two) times daily.  . dorzolamide-timolol (COSOPT) 22.3-6.8 MG/ML ophthalmic solution Place 1 drop into both eyes daily.  . ferrous sulfate 220 (44 Fe) MG/5ML solution Take 220 mg by mouth daily.  Marland Kitchen levothyroxine (SYNTHROID) 75 MCG tablet Take 1 tablet (75 mcg total)  by mouth daily before breakfast.  . Menthol, Topical Analgesic, (BIOFREEZE) 4 % GEL Apply 1 application topically daily as needed (pain). Apply to right side rib cage and lateral thigh  . metFORMIN (GLUCOPHAGE) 1000 MG tablet Take 1 tablet (1,000 mg total) by mouth daily with breakfast. (Patient taking differently: Take 500 mg by mouth daily with breakfast.)  . metoprolol succinate (TOPROL XL) 25 MG 24 hr tablet Take 1 tablet (25 mg total)  by mouth daily.  . Multiple Vitamins-Minerals (MULTI-VITAMIN GUMMIES PO) Take 2 tablets by mouth daily.  . pantoprazole (PROTONIX) 40 MG tablet Take 1 tablet (40 mg total) by mouth daily.  . potassium chloride 20 MEQ/15ML (10%) SOLN Take 15 mLs (20 mEq total) by mouth daily.  . rivaroxaban (XARELTO) 20 MG TABS tablet Take 1 tablet (20 mg total) by mouth daily with supper. (Patient taking differently: Take 20 mg by mouth daily.)  . spironolactone (ALDACTONE) 25 MG tablet Take 1 tablet (25 mg total) by mouth daily.  . travoprost, benzalkonium, (TRAVATAN) 0.004 % ophthalmic solution Place 1 drop into both eyes at bedtime.   . vitamin B-12 (CYANOCOBALAMIN) 500 MCG tablet Take 500 mcg by mouth daily.   . [DISCONTINUED] furosemide (LASIX) 40 MG tablet Take 1 tablet (40 mg total) by mouth daily. Change to every other day after 10 Lbs weight loss  08/06/2020     Allergies:   Meat [alpha-gal] and Amiodarone   Social History   Socioeconomic History  . Marital status: Widowed    Spouse name: Not on file  . Number of children: 1  . Years of education: Not on file  . Highest education level: Not on file  Occupational History  . Not on file  Tobacco Use  . Smoking status: Never Smoker  . Smokeless tobacco: Never Used  Vaping Use  . Vaping Use: Never used  Substance and Sexual Activity  . Alcohol use: No  . Drug use: Never  . Sexual activity: Yes  Other Topics Concern  . 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  ROS:   Please see the history of present illness.     All other systems reviewed  and are negative.  EKGs/Labs/Other Studies Reviewed:    The following studies were reviewed today:  EKG:   08/06/20: AF rate 95  Transthoracic Echocardiogram: Date:07/22/20 Results: 1. Left ventricular ejection fraction, by estimation, is 60 to 65%. The  left ventricle has normal function. Left ventricular endocardial border  not optimally defined to evaluate regional wall motion. Left ventricular  diastolic function could not be  evaluated.  2. Right ventricular systolic function is normal. The right ventricular  size is normal. There is mildly elevated pulmonary artery systolic  pressure. The estimated right ventricular systolic pressure is 13.2 mmHg.  3. The mitral valve is grossly normal. No evidence of mitral valve  regurgitation. No evidence of mitral stenosis.  4. The aortic valve is grossly normal. Aortic valve regurgitation is not  visualized. No aortic stenosis is present.  5. The inferior vena cava is dilated in  size with <50% respiratory  variability, suggesting right atrial pressure of 15 mmHg.   NonCardiac CT: Date:07/21/2020 Results: Right Atrial Enlargement Pulmonary Artery Dilation No significant CAC or Aortic Atherosclerosis  Recent Labs: 07/22/2020: TSH 2.717 07/25/2020: Hemoglobin 9.0; Platelets 387 07/26/2020: ALT 14; B Natriuretic Peptide 215.3; BUN 14; Creatinine, Ser 0.89; Magnesium 1.9; Potassium 3.9; Sodium 137  Recent Lipid Panel    Component Value Date/Time   CHOL 98 07/22/2020 0439   TRIG 87 07/22/2020 0439   HDL 30 (L) 07/22/2020 0439   CHOLHDL 3.3 07/22/2020 0439   VLDL 17 07/22/2020 0439   LDLCALC 51 07/22/2020 0439     Risk Assessment/Calculations:    CHA2DS2-VASc Score = 3  This indicates a 3.2% annual risk of stroke. The patient's score is based upon: CHF History: No HTN History: Yes Diabetes History: No Stroke History: No Vascular Disease History: No Age Score: 1 Gender Score: 1      Physical Exam:    VS:  BP  112/60   Pulse (!) 110   Ht 5' (1.524 m)   SpO2 98%   BMI 36.33 kg/m     Wt Readings from Last 3 Encounters:  08/06/20 186 lb (84.4 kg)  07/25/20 186 lb (84.4 kg)  05/29/20 186 lb 8.2 oz (84.6 kg)    GEN:  Well nourished, well developed in no acute distress HEENT: Normal NECK: No JVD; No carotid bruits LYMPHATICS: No lymphadenopathy CARDIAC: irregularly irregular tachycardia, no murmurs, rubs, gallops RESPIRATORY:  Clear to auscultation without rales, wheezing or rhonchi  ABDOMEN: Soft, non-tender, non-distended MUSCULOSKELETAL:  No edema; No deformity  SKIN: Warm and dry NEUROLOGIC:  Alert and oriented x 3 PSYCHIATRIC:  Normal affect   ASSESSMENT:    1. Permanent atrial fibrillation ([place])   2. Heart failure, type unknown ([place])   3. Diabetes mellitus with coincident hypertension ([place])   4. Hyperlipidemia, unspecified hyperlipidemia type    PLAN:    In order of problems listed above:  Permanent Atrial Fibrillation, Mild Dilation of the pulmonary artery - Risk factors include DM, HTN, HLD - CHADSVASC=3. - will send BMP, TSH, and CMP and CBC - continue DOAc - continue lasix; low threshold to repeat echo and increase dose - continue compression stockings use (off for today's exam) - continue metoprolol 25 mg PO daily; can increase if rate increases (discussed monitoring with patient and son) - continue spironolactone  Fall follow up unless new symptoms or abnormal test results warranting change in plan  Would be reasonable for  APP Follow up        Medication Adjustments/Labs and Tests Ordered: Current medicines are reviewed at length with the patient today.  Concerns regarding medicines are outlined above.  Orders Placed This Encounter  Procedures  . CBC  . Comprehensive metabolic panel  . Pro b natriuretic peptide (BNP)  . TSH   Meds ordered this encounter  Medications  . furosemide (LASIX) 40 MG tablet    Sig: Take 1 tablet (40 mg total) by mouth daily.  Change to every other day after 10 Lbs weight loss  08/06/2020    Dispense:  90 tablet    Refill:  3    Patient Instructions  Medication Instructions:  Your physician recommends that you continue on your current medications as directed. Please refer to the Current Medication list given to you today.  *If you need a refill on your cardiac medications before your next appointment, please call your pharmacy*   Lab Work: TODAY: BNP, CBC,  CMP, and TSH If you have labs (blood work) drawn today and your tests are completely normal, you will receive your results only by: Marland Kitchen MyChart Message (if you have MyChart) OR . A paper copy in the mail If you have any lab test that is abnormal or we need to change your treatment, we will call you to review the results.   Testing/Procedures: NONE   Follow-Up: At [facility], you and your health needs are our priority.  As part of our continuing mission to provide you with exceptional heart care, we have created designated Provider Care Teams.  These Care Teams include your primary Cardiologist (physician) and Advanced Practice Providers (APPs -  Physician Assistants and Nurse Practitioners) who all work together to provide you with the care you need, when you need it.  We recommend signing up for the patient portal called "MyChart".  Sign up information is provided on this After Visit Summary.  MyChart is used to connect with patients for Virtual Visits (Telemedicine).  Patients are able to view lab/test results, encounter notes, upcoming appointments, etc.  Non-urgent messages can be sent to your provider as well.   To learn more about what you can do with MyChart, go to [email or web address].    Your next appointment:   6-7 month(s)  The format for your next appointment:   In Person  Provider:   You may see [name], MD or one of the following Advanced Practice Providers on your designated Care Team:    Melina Copa,  PA-C  Ermalinda Barrios, PA-C          Signed, [name], MD  11/19/2020 3:14 PM    [place]

## 2020-11-19 NOTE — Telephone Encounter (Signed)
Home health nurse was unable to get urine from pt. The blood draw was unsuccessful, due to the vein collapsing once needle was inserted.   Pt has cardiology appt today. Nurse recommends getting blood draw then.

## 2020-11-19 NOTE — Telephone Encounter (Signed)
Pt's son Crystal Ashley called back told him [name] said to see if Cardiology will draw the labs when you see them today. If not can gladly schedule an appt here with our lab. Prashant verbalized understanding.

## 2020-11-19 NOTE — Telephone Encounter (Signed)
Left message on voicemail to call office.  

## 2020-11-19 NOTE — Telephone Encounter (Signed)
Please see message and advise 

## 2020-11-20 ENCOUNTER — Other Ambulatory Visit: Payer: Self-pay | Admitting: *Deleted

## 2020-11-20 ENCOUNTER — Other Ambulatory Visit: Payer: Self-pay

## 2020-11-20 DIAGNOSIS — I482 Chronic atrial fibrillation, unspecified: Secondary | ICD-10-CM | POA: Diagnosis not present

## 2020-11-20 DIAGNOSIS — E1159 Type 2 diabetes mellitus with other circulatory complications: Secondary | ICD-10-CM | POA: Diagnosis not present

## 2020-11-20 DIAGNOSIS — I152 Hypertension secondary to endocrine disorders: Secondary | ICD-10-CM | POA: Diagnosis not present

## 2020-11-20 DIAGNOSIS — Z8542 Personal history of malignant neoplasm of other parts of uterus: Secondary | ICD-10-CM | POA: Diagnosis not present

## 2020-11-20 DIAGNOSIS — E039 Hypothyroidism, unspecified: Secondary | ICD-10-CM | POA: Diagnosis not present

## 2020-11-20 DIAGNOSIS — I5043 Acute on chronic combined systolic (congestive) and diastolic (congestive) heart failure: Secondary | ICD-10-CM | POA: Diagnosis not present

## 2020-11-20 LAB — COMPREHENSIVE METABOLIC PANEL
ALT: 11 IU/L (ref 0–32)
AST: 18 IU/L (ref 0–40)
Albumin/Globulin Ratio: 1.3 (ref 1.2–2.2)
Albumin: 4 g/dL (ref 3.7–4.7)
Alkaline Phosphatase: 117 IU/L (ref 44–121)
BUN/Creatinine Ratio: 14 (ref 12–28)
BUN: 14 mg/dL (ref 8–27)
Bilirubin Total: 0.4 mg/dL (ref 0.0–1.2)
CO2: 24 mmol/L (ref 20–29)
Calcium: 9.6 mg/dL (ref 8.7–10.3)
Chloride: 97 mmol/L (ref 96–106)
Creatinine, Ser: 0.97 mg/dL (ref 0.57–1.00)
Globulin, Total: 3 g/dL (ref 1.5–4.5)
Glucose: 140 mg/dL — ABNORMAL HIGH (ref 65–99)
Potassium: 4.4 mmol/L (ref 3.5–5.2)
Sodium: 137 mmol/L (ref 134–144)
Total Protein: 7 g/dL (ref 6.0–8.5)
eGFR: 61 mL/min/{1.73_m2} (ref >59)

## 2020-11-20 LAB — CBC
Hematocrit: 40.1 % (ref 34.0–46.6)
Hemoglobin: 13 g/dL (ref 11.1–15.9)
MCH: 29.7 pg (ref 26.6–33.0)
MCHC: 32.4 g/dL (ref 31.5–35.7)
MCV: 92 fL (ref 79–97)
Platelets: 337 10*3/uL (ref 150–450)
RBC: 4.37 x10E6/uL (ref 3.77–5.28)
RDW: 15.2 % (ref 11.7–15.4)
WBC: 9.9 10*3/uL (ref 3.4–10.8)

## 2020-11-20 LAB — PRO B NATRIURETIC PEPTIDE: NT-Pro BNP: 905 pg/mL — ABNORMAL HIGH (ref 0–301)

## 2020-11-20 LAB — TSH: TSH: 3.21 u[IU]/mL (ref 0.450–4.500)

## 2020-11-20 MED ORDER — FUROSEMIDE 40 MG PO TABS: 40.0000 mg | ORAL_TABLET | Freq: Every day | ORAL | 3 refills | Status: DC

## 2020-11-20 NOTE — Progress Notes (Signed)
Medication resent to [facility] per patient's request.

## 2020-11-20 NOTE — Addendum Note (Signed)
Addended by: Gaetano Net on: 11/20/2020 01:39 PM   Modules accepted: Orders

## 2020-11-22 NOTE — Addendum Note (Signed)
Addended by: Marian Sorrow on: 11/22/2020 01:36 PM   Modules accepted: Orders

## 2020-11-22 NOTE — Telephone Encounter (Signed)
Spoke to pt's son Crystal Ashley asked him why he is wanting urine done? Crystal Ashley said pt has been going to bed early and more fatigue and brother who is a doctor said to check a urine. Told him okay will fax orders over to [facility]. Crystal Ashley verbalized understanding. Orders faxed.

## 2020-11-25 DIAGNOSIS — I482 Chronic atrial fibrillation, unspecified: Secondary | ICD-10-CM | POA: Diagnosis not present

## 2020-11-25 DIAGNOSIS — I152 Hypertension secondary to endocrine disorders: Secondary | ICD-10-CM | POA: Diagnosis not present

## 2020-11-25 DIAGNOSIS — Z8542 Personal history of malignant neoplasm of other parts of uterus: Secondary | ICD-10-CM | POA: Diagnosis not present

## 2020-11-25 DIAGNOSIS — I5043 Acute on chronic combined systolic (congestive) and diastolic (congestive) heart failure: Secondary | ICD-10-CM | POA: Diagnosis not present

## 2020-11-25 DIAGNOSIS — E1159 Type 2 diabetes mellitus with other circulatory complications: Secondary | ICD-10-CM | POA: Diagnosis not present

## 2020-11-25 DIAGNOSIS — E039 Hypothyroidism, unspecified: Secondary | ICD-10-CM | POA: Diagnosis not present

## 2020-11-26 ENCOUNTER — Encounter: Payer: Self-pay | Admitting: Physician Assistant

## 2020-11-26 DIAGNOSIS — I5043 Acute on chronic combined systolic (congestive) and diastolic (congestive) heart failure: Secondary | ICD-10-CM | POA: Diagnosis not present

## 2020-11-26 DIAGNOSIS — I482 Chronic atrial fibrillation, unspecified: Secondary | ICD-10-CM | POA: Diagnosis not present

## 2020-11-26 DIAGNOSIS — E1159 Type 2 diabetes mellitus with other circulatory complications: Secondary | ICD-10-CM | POA: Diagnosis not present

## 2020-11-26 DIAGNOSIS — Z8542 Personal history of malignant neoplasm of other parts of uterus: Secondary | ICD-10-CM | POA: Diagnosis not present

## 2020-11-26 DIAGNOSIS — R300 Dysuria: Secondary | ICD-10-CM | POA: Diagnosis not present

## 2020-11-26 DIAGNOSIS — I152 Hypertension secondary to endocrine disorders: Secondary | ICD-10-CM | POA: Diagnosis not present

## 2020-11-26 DIAGNOSIS — E039 Hypothyroidism, unspecified: Secondary | ICD-10-CM | POA: Diagnosis not present

## 2020-11-28 ENCOUNTER — Other Ambulatory Visit: Payer: Self-pay

## 2020-11-28 MED ORDER — FUROSEMIDE 40 MG PO TABS: 40.0000 mg | ORAL_TABLET | Freq: Every day | ORAL | 3 refills | Status: DC

## 2020-12-02 ENCOUNTER — Telehealth: Payer: Self-pay | Admitting: *Deleted

## 2020-12-02 DIAGNOSIS — I152 Hypertension secondary to endocrine disorders: Secondary | ICD-10-CM | POA: Diagnosis not present

## 2020-12-02 DIAGNOSIS — I482 Chronic atrial fibrillation, unspecified: Secondary | ICD-10-CM | POA: Diagnosis not present

## 2020-12-02 DIAGNOSIS — E1159 Type 2 diabetes mellitus with other circulatory complications: Secondary | ICD-10-CM | POA: Diagnosis not present

## 2020-12-02 DIAGNOSIS — Z8542 Personal history of malignant neoplasm of other parts of uterus: Secondary | ICD-10-CM | POA: Diagnosis not present

## 2020-12-02 DIAGNOSIS — E039 Hypothyroidism, unspecified: Secondary | ICD-10-CM | POA: Diagnosis not present

## 2020-12-02 DIAGNOSIS — I5043 Acute on chronic combined systolic (congestive) and diastolic (congestive) heart failure: Secondary | ICD-10-CM | POA: Diagnosis not present

## 2020-12-02 MED ORDER — NITROFURANTOIN MONOHYD MACRO 100 MG PO CAPS: 100.0000 mg | ORAL_CAPSULE | Freq: Two times a day (BID) | ORAL | 0 refills | Status: DC

## 2020-12-02 NOTE — Telephone Encounter (Signed)
Dr. Jerline Pain, please review lab results on your desk and advise treatment. Thank you.

## 2020-12-02 NOTE — Telephone Encounter (Signed)
Spoke to pt's son [name], told him received lab work that nurse did. I had Dr. Jerline Pain review due to [name] out of the office today. Dr. Jerline Pain said Culture positive for  ecoli >100k units. Recommend starting Macrobid 100mg  bid x 5 days for pt. Prashant verbalized understanding and said to send to [facility]. Told him will send now. Rx sent

## 2020-12-02 NOTE — Telephone Encounter (Signed)
Culture positive for  ecoli >100k units.  Recommend starting macrobid 100mg  bid x 5 days.  Algis Greenhouse. Jerline Pain, MD 12/02/2020 1:13 PM

## 2020-12-03 DIAGNOSIS — I5043 Acute on chronic combined systolic (congestive) and diastolic (congestive) heart failure: Secondary | ICD-10-CM | POA: Diagnosis not present

## 2020-12-03 DIAGNOSIS — I482 Chronic atrial fibrillation, unspecified: Secondary | ICD-10-CM | POA: Diagnosis not present

## 2020-12-03 DIAGNOSIS — E039 Hypothyroidism, unspecified: Secondary | ICD-10-CM | POA: Diagnosis not present

## 2020-12-03 DIAGNOSIS — E1159 Type 2 diabetes mellitus with other circulatory complications: Secondary | ICD-10-CM | POA: Diagnosis not present

## 2020-12-03 DIAGNOSIS — I152 Hypertension secondary to endocrine disorders: Secondary | ICD-10-CM | POA: Diagnosis not present

## 2020-12-03 DIAGNOSIS — Z8542 Personal history of malignant neoplasm of other parts of uterus: Secondary | ICD-10-CM | POA: Diagnosis not present

## 2020-12-04 ENCOUNTER — Telehealth: Payer: Self-pay

## 2020-12-04 NOTE — Telephone Encounter (Signed)
Home Health Verbal Orders  Agency:  [facility]    Request:  Continuation of physical therapy  Frequency: once a week for 9 weeks

## 2020-12-04 NOTE — Telephone Encounter (Signed)
[place] and spoke to [place], verbal orders given for continuation of physical therapy once a week for 9 weeks for pt, okay per [place]. Sharyn Lull verbalized understanding.

## 2020-12-07 DIAGNOSIS — Z7984 Long term (current) use of oral hypoglycemic drugs: Secondary | ICD-10-CM | POA: Diagnosis not present

## 2020-12-07 DIAGNOSIS — E1159 Type 2 diabetes mellitus with other circulatory complications: Secondary | ICD-10-CM | POA: Diagnosis not present

## 2020-12-07 DIAGNOSIS — Z6841 Body Mass Index (BMI) 40.0 and over, adult: Secondary | ICD-10-CM | POA: Diagnosis not present

## 2020-12-07 DIAGNOSIS — H9193 Unspecified hearing loss, bilateral: Secondary | ICD-10-CM | POA: Diagnosis not present

## 2020-12-07 DIAGNOSIS — Z8542 Personal history of malignant neoplasm of other parts of uterus: Secondary | ICD-10-CM | POA: Diagnosis not present

## 2020-12-07 DIAGNOSIS — D649 Anemia, unspecified: Secondary | ICD-10-CM | POA: Diagnosis not present

## 2020-12-07 DIAGNOSIS — I482 Chronic atrial fibrillation, unspecified: Secondary | ICD-10-CM | POA: Diagnosis not present

## 2020-12-07 DIAGNOSIS — I5043 Acute on chronic combined systolic (congestive) and diastolic (congestive) heart failure: Secondary | ICD-10-CM | POA: Diagnosis not present

## 2020-12-07 DIAGNOSIS — E1136 Type 2 diabetes mellitus with diabetic cataract: Secondary | ICD-10-CM | POA: Diagnosis not present

## 2020-12-07 DIAGNOSIS — H409 Unspecified glaucoma: Secondary | ICD-10-CM | POA: Diagnosis not present

## 2020-12-07 DIAGNOSIS — R32 Unspecified urinary incontinence: Secondary | ICD-10-CM | POA: Diagnosis not present

## 2020-12-07 DIAGNOSIS — E039 Hypothyroidism, unspecified: Secondary | ICD-10-CM | POA: Diagnosis not present

## 2020-12-07 DIAGNOSIS — Z7901 Long term (current) use of anticoagulants: Secondary | ICD-10-CM | POA: Diagnosis not present

## 2020-12-07 DIAGNOSIS — M80059D Age-related osteoporosis with current pathological fracture, unspecified femur, subsequent encounter for fracture with routine healing: Secondary | ICD-10-CM | POA: Diagnosis not present

## 2020-12-07 DIAGNOSIS — E1165 Type 2 diabetes mellitus with hyperglycemia: Secondary | ICD-10-CM | POA: Diagnosis not present

## 2020-12-07 DIAGNOSIS — M8008XD Age-related osteoporosis with current pathological fracture, vertebra(e), subsequent encounter for fracture with routine healing: Secondary | ICD-10-CM | POA: Diagnosis not present

## 2020-12-07 DIAGNOSIS — I152 Hypertension secondary to endocrine disorders: Secondary | ICD-10-CM | POA: Diagnosis not present

## 2020-12-07 DIAGNOSIS — R1312 Dysphagia, oropharyngeal phase: Secondary | ICD-10-CM | POA: Diagnosis not present

## 2020-12-09 DIAGNOSIS — I152 Hypertension secondary to endocrine disorders: Secondary | ICD-10-CM | POA: Diagnosis not present

## 2020-12-09 DIAGNOSIS — Z8542 Personal history of malignant neoplasm of other parts of uterus: Secondary | ICD-10-CM | POA: Diagnosis not present

## 2020-12-09 DIAGNOSIS — E039 Hypothyroidism, unspecified: Secondary | ICD-10-CM | POA: Diagnosis not present

## 2020-12-09 DIAGNOSIS — E1159 Type 2 diabetes mellitus with other circulatory complications: Secondary | ICD-10-CM | POA: Diagnosis not present

## 2020-12-09 DIAGNOSIS — I5043 Acute on chronic combined systolic (congestive) and diastolic (congestive) heart failure: Secondary | ICD-10-CM | POA: Diagnosis not present

## 2020-12-09 DIAGNOSIS — I482 Chronic atrial fibrillation, unspecified: Secondary | ICD-10-CM | POA: Diagnosis not present

## 2020-12-10 DIAGNOSIS — E039 Hypothyroidism, unspecified: Secondary | ICD-10-CM | POA: Diagnosis not present

## 2020-12-10 DIAGNOSIS — Z8542 Personal history of malignant neoplasm of other parts of uterus: Secondary | ICD-10-CM | POA: Diagnosis not present

## 2020-12-10 DIAGNOSIS — E1159 Type 2 diabetes mellitus with other circulatory complications: Secondary | ICD-10-CM | POA: Diagnosis not present

## 2020-12-10 DIAGNOSIS — I5043 Acute on chronic combined systolic (congestive) and diastolic (congestive) heart failure: Secondary | ICD-10-CM | POA: Diagnosis not present

## 2020-12-10 DIAGNOSIS — I482 Chronic atrial fibrillation, unspecified: Secondary | ICD-10-CM | POA: Diagnosis not present

## 2020-12-10 DIAGNOSIS — I152 Hypertension secondary to endocrine disorders: Secondary | ICD-10-CM | POA: Diagnosis not present

## 2020-12-11 DIAGNOSIS — Z8542 Personal history of malignant neoplasm of other parts of uterus: Secondary | ICD-10-CM | POA: Diagnosis not present

## 2020-12-11 DIAGNOSIS — I5043 Acute on chronic combined systolic (congestive) and diastolic (congestive) heart failure: Secondary | ICD-10-CM | POA: Diagnosis not present

## 2020-12-11 DIAGNOSIS — E1159 Type 2 diabetes mellitus with other circulatory complications: Secondary | ICD-10-CM | POA: Diagnosis not present

## 2020-12-11 DIAGNOSIS — I152 Hypertension secondary to endocrine disorders: Secondary | ICD-10-CM | POA: Diagnosis not present

## 2020-12-11 DIAGNOSIS — I482 Chronic atrial fibrillation, unspecified: Secondary | ICD-10-CM | POA: Diagnosis not present

## 2020-12-11 DIAGNOSIS — E039 Hypothyroidism, unspecified: Secondary | ICD-10-CM | POA: Diagnosis not present

## 2020-12-16 DIAGNOSIS — Z8542 Personal history of malignant neoplasm of other parts of uterus: Secondary | ICD-10-CM | POA: Diagnosis not present

## 2020-12-16 DIAGNOSIS — I482 Chronic atrial fibrillation, unspecified: Secondary | ICD-10-CM | POA: Diagnosis not present

## 2020-12-16 DIAGNOSIS — E039 Hypothyroidism, unspecified: Secondary | ICD-10-CM | POA: Diagnosis not present

## 2020-12-16 DIAGNOSIS — I5043 Acute on chronic combined systolic (congestive) and diastolic (congestive) heart failure: Secondary | ICD-10-CM | POA: Diagnosis not present

## 2020-12-16 DIAGNOSIS — E1159 Type 2 diabetes mellitus with other circulatory complications: Secondary | ICD-10-CM | POA: Diagnosis not present

## 2020-12-16 DIAGNOSIS — I152 Hypertension secondary to endocrine disorders: Secondary | ICD-10-CM | POA: Diagnosis not present

## 2020-12-17 DIAGNOSIS — I5043 Acute on chronic combined systolic (congestive) and diastolic (congestive) heart failure: Secondary | ICD-10-CM | POA: Diagnosis not present

## 2020-12-17 DIAGNOSIS — E1159 Type 2 diabetes mellitus with other circulatory complications: Secondary | ICD-10-CM | POA: Diagnosis not present

## 2020-12-17 DIAGNOSIS — E039 Hypothyroidism, unspecified: Secondary | ICD-10-CM | POA: Diagnosis not present

## 2020-12-17 DIAGNOSIS — I152 Hypertension secondary to endocrine disorders: Secondary | ICD-10-CM | POA: Diagnosis not present

## 2020-12-17 DIAGNOSIS — Z8542 Personal history of malignant neoplasm of other parts of uterus: Secondary | ICD-10-CM | POA: Diagnosis not present

## 2020-12-17 DIAGNOSIS — I482 Chronic atrial fibrillation, unspecified: Secondary | ICD-10-CM | POA: Diagnosis not present

## 2020-12-18 DIAGNOSIS — Z8542 Personal history of malignant neoplasm of other parts of uterus: Secondary | ICD-10-CM | POA: Diagnosis not present

## 2020-12-18 DIAGNOSIS — E1159 Type 2 diabetes mellitus with other circulatory complications: Secondary | ICD-10-CM | POA: Diagnosis not present

## 2020-12-18 DIAGNOSIS — E039 Hypothyroidism, unspecified: Secondary | ICD-10-CM | POA: Diagnosis not present

## 2020-12-18 DIAGNOSIS — I152 Hypertension secondary to endocrine disorders: Secondary | ICD-10-CM | POA: Diagnosis not present

## 2020-12-18 DIAGNOSIS — I482 Chronic atrial fibrillation, unspecified: Secondary | ICD-10-CM | POA: Diagnosis not present

## 2020-12-18 DIAGNOSIS — I5043 Acute on chronic combined systolic (congestive) and diastolic (congestive) heart failure: Secondary | ICD-10-CM | POA: Diagnosis not present

## 2020-12-23 DIAGNOSIS — E039 Hypothyroidism, unspecified: Secondary | ICD-10-CM | POA: Diagnosis not present

## 2020-12-23 DIAGNOSIS — Z8542 Personal history of malignant neoplasm of other parts of uterus: Secondary | ICD-10-CM | POA: Diagnosis not present

## 2020-12-23 DIAGNOSIS — E1159 Type 2 diabetes mellitus with other circulatory complications: Secondary | ICD-10-CM | POA: Diagnosis not present

## 2020-12-23 DIAGNOSIS — I152 Hypertension secondary to endocrine disorders: Secondary | ICD-10-CM | POA: Diagnosis not present

## 2020-12-23 DIAGNOSIS — I482 Chronic atrial fibrillation, unspecified: Secondary | ICD-10-CM | POA: Diagnosis not present

## 2020-12-23 DIAGNOSIS — I5043 Acute on chronic combined systolic (congestive) and diastolic (congestive) heart failure: Secondary | ICD-10-CM | POA: Diagnosis not present

## 2020-12-24 DIAGNOSIS — E039 Hypothyroidism, unspecified: Secondary | ICD-10-CM | POA: Diagnosis not present

## 2020-12-24 DIAGNOSIS — I482 Chronic atrial fibrillation, unspecified: Secondary | ICD-10-CM | POA: Diagnosis not present

## 2020-12-24 DIAGNOSIS — E1159 Type 2 diabetes mellitus with other circulatory complications: Secondary | ICD-10-CM | POA: Diagnosis not present

## 2020-12-24 DIAGNOSIS — I152 Hypertension secondary to endocrine disorders: Secondary | ICD-10-CM | POA: Diagnosis not present

## 2020-12-24 DIAGNOSIS — I5043 Acute on chronic combined systolic (congestive) and diastolic (congestive) heart failure: Secondary | ICD-10-CM | POA: Diagnosis not present

## 2020-12-24 DIAGNOSIS — Z8542 Personal history of malignant neoplasm of other parts of uterus: Secondary | ICD-10-CM | POA: Diagnosis not present

## 2020-12-25 DIAGNOSIS — E1159 Type 2 diabetes mellitus with other circulatory complications: Secondary | ICD-10-CM | POA: Diagnosis not present

## 2020-12-25 DIAGNOSIS — I482 Chronic atrial fibrillation, unspecified: Secondary | ICD-10-CM | POA: Diagnosis not present

## 2020-12-25 DIAGNOSIS — I152 Hypertension secondary to endocrine disorders: Secondary | ICD-10-CM | POA: Diagnosis not present

## 2020-12-25 DIAGNOSIS — I5043 Acute on chronic combined systolic (congestive) and diastolic (congestive) heart failure: Secondary | ICD-10-CM | POA: Diagnosis not present

## 2020-12-25 DIAGNOSIS — Z8542 Personal history of malignant neoplasm of other parts of uterus: Secondary | ICD-10-CM | POA: Diagnosis not present

## 2020-12-25 DIAGNOSIS — E039 Hypothyroidism, unspecified: Secondary | ICD-10-CM | POA: Diagnosis not present

## 2020-12-30 DIAGNOSIS — Z8542 Personal history of malignant neoplasm of other parts of uterus: Secondary | ICD-10-CM | POA: Diagnosis not present

## 2020-12-30 DIAGNOSIS — I152 Hypertension secondary to endocrine disorders: Secondary | ICD-10-CM | POA: Diagnosis not present

## 2020-12-30 DIAGNOSIS — E039 Hypothyroidism, unspecified: Secondary | ICD-10-CM | POA: Diagnosis not present

## 2020-12-30 DIAGNOSIS — E1159 Type 2 diabetes mellitus with other circulatory complications: Secondary | ICD-10-CM | POA: Diagnosis not present

## 2020-12-30 DIAGNOSIS — I5043 Acute on chronic combined systolic (congestive) and diastolic (congestive) heart failure: Secondary | ICD-10-CM | POA: Diagnosis not present

## 2020-12-30 DIAGNOSIS — I482 Chronic atrial fibrillation, unspecified: Secondary | ICD-10-CM | POA: Diagnosis not present

## 2021-01-01 DIAGNOSIS — I5043 Acute on chronic combined systolic (congestive) and diastolic (congestive) heart failure: Secondary | ICD-10-CM | POA: Diagnosis not present

## 2021-01-01 DIAGNOSIS — Z8542 Personal history of malignant neoplasm of other parts of uterus: Secondary | ICD-10-CM | POA: Diagnosis not present

## 2021-01-01 DIAGNOSIS — I152 Hypertension secondary to endocrine disorders: Secondary | ICD-10-CM | POA: Diagnosis not present

## 2021-01-01 DIAGNOSIS — E039 Hypothyroidism, unspecified: Secondary | ICD-10-CM | POA: Diagnosis not present

## 2021-01-01 DIAGNOSIS — I482 Chronic atrial fibrillation, unspecified: Secondary | ICD-10-CM | POA: Diagnosis not present

## 2021-01-01 DIAGNOSIS — E1159 Type 2 diabetes mellitus with other circulatory complications: Secondary | ICD-10-CM | POA: Diagnosis not present

## 2021-01-02 ENCOUNTER — Other Ambulatory Visit: Payer: Self-pay

## 2021-01-02 MED ORDER — RIVAROXABAN 20 MG PO TABS: 20.0000 mg | ORAL_TABLET | Freq: Every day | ORAL | 1 refills | Status: DC

## 2021-01-02 NOTE — Telephone Encounter (Signed)
[facility] is requesting a refill on spironolactone, pantoprazole and metoprolol. Would Dr. Gasper Sells like to refill these medications? Please address

## 2021-01-02 NOTE — Telephone Encounter (Signed)
Received faxed refill request for Xarelto from [facility].  Pt last saw Dr Gasper Sells on 11/19/20, last labs 11/19/20 Creat 0.97, age 75, weight 84.4kg, CrCl 67.8, based on CrCl pt is on appropriate dosage of Xarelto 20mg  QD.  Will refill rx.

## 2021-01-03 DIAGNOSIS — I482 Chronic atrial fibrillation, unspecified: Secondary | ICD-10-CM | POA: Diagnosis not present

## 2021-01-03 DIAGNOSIS — I152 Hypertension secondary to endocrine disorders: Secondary | ICD-10-CM | POA: Diagnosis not present

## 2021-01-03 DIAGNOSIS — Z8542 Personal history of malignant neoplasm of other parts of uterus: Secondary | ICD-10-CM | POA: Diagnosis not present

## 2021-01-03 DIAGNOSIS — E039 Hypothyroidism, unspecified: Secondary | ICD-10-CM | POA: Diagnosis not present

## 2021-01-03 DIAGNOSIS — E1159 Type 2 diabetes mellitus with other circulatory complications: Secondary | ICD-10-CM | POA: Diagnosis not present

## 2021-01-03 DIAGNOSIS — I5043 Acute on chronic combined systolic (congestive) and diastolic (congestive) heart failure: Secondary | ICD-10-CM | POA: Diagnosis not present

## 2021-01-03 MED ORDER — PANTOPRAZOLE SODIUM 40 MG PO TBEC: 40.0000 mg | DELAYED_RELEASE_TABLET | Freq: Every day | ORAL | 0 refills | Status: DC

## 2021-01-03 MED ORDER — SPIRONOLACTONE 25 MG PO TABS: 25.0000 mg | ORAL_TABLET | Freq: Every day | ORAL | 3 refills | Status: DC

## 2021-01-03 MED ORDER — METOPROLOL SUCCINATE ER 25 MG PO TB24: 25.0000 mg | ORAL_TABLET | Freq: Every day | ORAL | 3 refills | Status: DC

## 2021-01-06 DIAGNOSIS — Z7901 Long term (current) use of anticoagulants: Secondary | ICD-10-CM | POA: Diagnosis not present

## 2021-01-06 DIAGNOSIS — R1312 Dysphagia, oropharyngeal phase: Secondary | ICD-10-CM | POA: Diagnosis not present

## 2021-01-06 DIAGNOSIS — E1165 Type 2 diabetes mellitus with hyperglycemia: Secondary | ICD-10-CM | POA: Diagnosis not present

## 2021-01-06 DIAGNOSIS — I482 Chronic atrial fibrillation, unspecified: Secondary | ICD-10-CM | POA: Diagnosis not present

## 2021-01-06 DIAGNOSIS — E039 Hypothyroidism, unspecified: Secondary | ICD-10-CM | POA: Diagnosis not present

## 2021-01-06 DIAGNOSIS — Z7984 Long term (current) use of oral hypoglycemic drugs: Secondary | ICD-10-CM | POA: Diagnosis not present

## 2021-01-06 DIAGNOSIS — H409 Unspecified glaucoma: Secondary | ICD-10-CM | POA: Diagnosis not present

## 2021-01-06 DIAGNOSIS — E1136 Type 2 diabetes mellitus with diabetic cataract: Secondary | ICD-10-CM | POA: Diagnosis not present

## 2021-01-06 DIAGNOSIS — M8008XD Age-related osteoporosis with current pathological fracture, vertebra(e), subsequent encounter for fracture with routine healing: Secondary | ICD-10-CM | POA: Diagnosis not present

## 2021-01-06 DIAGNOSIS — R32 Unspecified urinary incontinence: Secondary | ICD-10-CM | POA: Diagnosis not present

## 2021-01-06 DIAGNOSIS — H9193 Unspecified hearing loss, bilateral: Secondary | ICD-10-CM | POA: Diagnosis not present

## 2021-01-06 DIAGNOSIS — E1159 Type 2 diabetes mellitus with other circulatory complications: Secondary | ICD-10-CM | POA: Diagnosis not present

## 2021-01-06 DIAGNOSIS — I5043 Acute on chronic combined systolic (congestive) and diastolic (congestive) heart failure: Secondary | ICD-10-CM | POA: Diagnosis not present

## 2021-01-06 DIAGNOSIS — D649 Anemia, unspecified: Secondary | ICD-10-CM | POA: Diagnosis not present

## 2021-01-06 DIAGNOSIS — I152 Hypertension secondary to endocrine disorders: Secondary | ICD-10-CM | POA: Diagnosis not present

## 2021-01-06 DIAGNOSIS — M80059D Age-related osteoporosis with current pathological fracture, unspecified femur, subsequent encounter for fracture with routine healing: Secondary | ICD-10-CM | POA: Diagnosis not present

## 2021-01-06 DIAGNOSIS — Z6841 Body Mass Index (BMI) 40.0 and over, adult: Secondary | ICD-10-CM | POA: Diagnosis not present

## 2021-01-06 DIAGNOSIS — Z8542 Personal history of malignant neoplasm of other parts of uterus: Secondary | ICD-10-CM | POA: Diagnosis not present

## 2021-01-07 DIAGNOSIS — E1159 Type 2 diabetes mellitus with other circulatory complications: Secondary | ICD-10-CM | POA: Diagnosis not present

## 2021-01-07 DIAGNOSIS — I152 Hypertension secondary to endocrine disorders: Secondary | ICD-10-CM | POA: Diagnosis not present

## 2021-01-07 DIAGNOSIS — E039 Hypothyroidism, unspecified: Secondary | ICD-10-CM | POA: Diagnosis not present

## 2021-01-07 DIAGNOSIS — I5043 Acute on chronic combined systolic (congestive) and diastolic (congestive) heart failure: Secondary | ICD-10-CM | POA: Diagnosis not present

## 2021-01-07 DIAGNOSIS — I482 Chronic atrial fibrillation, unspecified: Secondary | ICD-10-CM | POA: Diagnosis not present

## 2021-01-07 DIAGNOSIS — Z8542 Personal history of malignant neoplasm of other parts of uterus: Secondary | ICD-10-CM | POA: Diagnosis not present

## 2021-01-09 ENCOUNTER — Encounter: Payer: Self-pay | Admitting: Physician Assistant

## 2021-01-09 ENCOUNTER — Encounter: Payer: Medicare Other | Admitting: Family Medicine

## 2021-01-10 ENCOUNTER — Encounter: Payer: Self-pay | Admitting: Physician Assistant

## 2021-01-10 ENCOUNTER — Other Ambulatory Visit: Payer: Self-pay

## 2021-01-10 DIAGNOSIS — E7800 Pure hypercholesterolemia, unspecified: Secondary | ICD-10-CM

## 2021-01-10 MED ORDER — PANTOPRAZOLE SODIUM 40 MG PO TBEC: 40.0000 mg | DELAYED_RELEASE_TABLET | Freq: Every day | ORAL | 0 refills | Status: DC

## 2021-01-12 ENCOUNTER — Other Ambulatory Visit: Payer: Self-pay | Admitting: Physician Assistant

## 2021-01-12 DIAGNOSIS — E039 Hypothyroidism, unspecified: Secondary | ICD-10-CM

## 2021-01-12 DIAGNOSIS — E7800 Pure hypercholesterolemia, unspecified: Secondary | ICD-10-CM

## 2021-01-14 DIAGNOSIS — I152 Hypertension secondary to endocrine disorders: Secondary | ICD-10-CM | POA: Diagnosis not present

## 2021-01-14 DIAGNOSIS — I5043 Acute on chronic combined systolic (congestive) and diastolic (congestive) heart failure: Secondary | ICD-10-CM | POA: Diagnosis not present

## 2021-01-14 DIAGNOSIS — E1159 Type 2 diabetes mellitus with other circulatory complications: Secondary | ICD-10-CM | POA: Diagnosis not present

## 2021-01-14 DIAGNOSIS — I482 Chronic atrial fibrillation, unspecified: Secondary | ICD-10-CM | POA: Diagnosis not present

## 2021-01-14 DIAGNOSIS — E039 Hypothyroidism, unspecified: Secondary | ICD-10-CM | POA: Diagnosis not present

## 2021-01-14 DIAGNOSIS — Z8542 Personal history of malignant neoplasm of other parts of uterus: Secondary | ICD-10-CM | POA: Diagnosis not present

## 2021-01-15 ENCOUNTER — Telehealth: Payer: Self-pay

## 2021-01-15 DIAGNOSIS — Z8542 Personal history of malignant neoplasm of other parts of uterus: Secondary | ICD-10-CM | POA: Diagnosis not present

## 2021-01-15 DIAGNOSIS — I482 Chronic atrial fibrillation, unspecified: Secondary | ICD-10-CM | POA: Diagnosis not present

## 2021-01-15 DIAGNOSIS — E1159 Type 2 diabetes mellitus with other circulatory complications: Secondary | ICD-10-CM | POA: Diagnosis not present

## 2021-01-15 DIAGNOSIS — E039 Hypothyroidism, unspecified: Secondary | ICD-10-CM | POA: Diagnosis not present

## 2021-01-15 DIAGNOSIS — I152 Hypertension secondary to endocrine disorders: Secondary | ICD-10-CM | POA: Diagnosis not present

## 2021-01-15 DIAGNOSIS — I5043 Acute on chronic combined systolic (congestive) and diastolic (congestive) heart failure: Secondary | ICD-10-CM | POA: Diagnosis not present

## 2021-01-15 NOTE — Telephone Encounter (Signed)
Patients son is calling in and wanted to see if orders could be sent in for the patient to have home health.

## 2021-01-15 NOTE — Telephone Encounter (Signed)
Please call pt's son and schedule them next week with Dr. Jerline Pain to discuss [facility] referral. Thanks

## 2021-01-15 NOTE — Telephone Encounter (Signed)
Patient has been scheduled

## 2021-01-20 DIAGNOSIS — I482 Chronic atrial fibrillation, unspecified: Secondary | ICD-10-CM | POA: Diagnosis not present

## 2021-01-20 DIAGNOSIS — Z8542 Personal history of malignant neoplasm of other parts of uterus: Secondary | ICD-10-CM | POA: Diagnosis not present

## 2021-01-20 DIAGNOSIS — E1159 Type 2 diabetes mellitus with other circulatory complications: Secondary | ICD-10-CM | POA: Diagnosis not present

## 2021-01-20 DIAGNOSIS — E039 Hypothyroidism, unspecified: Secondary | ICD-10-CM | POA: Diagnosis not present

## 2021-01-20 DIAGNOSIS — I5043 Acute on chronic combined systolic (congestive) and diastolic (congestive) heart failure: Secondary | ICD-10-CM | POA: Diagnosis not present

## 2021-01-20 DIAGNOSIS — I152 Hypertension secondary to endocrine disorders: Secondary | ICD-10-CM | POA: Diagnosis not present

## 2021-01-21 ENCOUNTER — Other Ambulatory Visit: Payer: Self-pay

## 2021-01-21 ENCOUNTER — Encounter: Payer: Self-pay | Admitting: Family Medicine

## 2021-01-21 ENCOUNTER — Ambulatory Visit (INDEPENDENT_AMBULATORY_CARE_PROVIDER_SITE_OTHER): Payer: Medicare Other | Admitting: Family Medicine

## 2021-01-21 VITALS — BP 125/94 | HR 88 | Temp 97.9°F | Ht 60.0 in

## 2021-01-21 DIAGNOSIS — E119 Type 2 diabetes mellitus without complications: Secondary | ICD-10-CM

## 2021-01-21 DIAGNOSIS — D649 Anemia, unspecified: Secondary | ICD-10-CM | POA: Diagnosis not present

## 2021-01-21 DIAGNOSIS — I10 Essential (primary) hypertension: Secondary | ICD-10-CM | POA: Diagnosis not present

## 2021-01-21 DIAGNOSIS — E785 Hyperlipidemia, unspecified: Secondary | ICD-10-CM

## 2021-01-21 DIAGNOSIS — I482 Chronic atrial fibrillation, unspecified: Secondary | ICD-10-CM

## 2021-01-21 DIAGNOSIS — R5381 Other malaise: Secondary | ICD-10-CM

## 2021-01-21 DIAGNOSIS — E039 Hypothyroidism, unspecified: Secondary | ICD-10-CM | POA: Diagnosis not present

## 2021-01-21 DIAGNOSIS — E538 Deficiency of other specified B group vitamins: Secondary | ICD-10-CM

## 2021-01-21 NOTE — Assessment & Plan Note (Signed)
Rate controlled On Xarelto 

## 2021-01-21 NOTE — Assessment & Plan Note (Signed)
Check iron panel and CBC today.

## 2021-01-21 NOTE — Assessment & Plan Note (Signed)
Check lipids.  She is on atorvastatin 10 mg daily.

## 2021-01-21 NOTE — Assessment & Plan Note (Signed)
Check A1c. She is on metformin 500mg daily.  

## 2021-01-21 NOTE — Assessment & Plan Note (Signed)
Patient essentially bedbound due to debility.  Family is requesting home health referral.  We will place this today.  She is not able to leave home without assistance.  Requires close monitoring due to several medical comorbidities including heart failure.

## 2021-01-21 NOTE — Assessment & Plan Note (Signed)
Check TSH.  Continue Synthroid 75 mcg daily. 

## 2021-01-21 NOTE — Patient Instructions (Signed)
It was very nice to see you today!  We will check blood work today and a urine sample.  I will refer to home health.  Please come back to see Samantha in 3 to 6 months.  Take care, Dr Jerline Pain  PLEASE NOTE:  If you had any lab tests please let us know if you have not heard back within a few days. You may see your results on mychart before we have a chance to review them but we will give you a call once they are reviewed by [place]. If we ordered any referrals today, please let us know if you have not heard from their office within the next week.   Please try these tips to maintain a healthy lifestyle:   Eat at least 3 REAL meals and 1-2 snacks per day.  Aim for no more than 5 hours between eating.  If you eat breakfast, please do so within one hour of getting up.    Each meal should contain half fruits/vegetables, one quarter protein, and one quarter carbs (no bigger than a computer mouse)   Cut down on sweet beverages. This includes juice, soda, and sweet tea.     Drink at least 1 glass of water with each meal and aim for at least 8 glasses per day   Exercise at least 150 minutes every week.

## 2021-01-21 NOTE — Progress Notes (Signed)
   Crystal Ashley is a 75 y.o. female who presents today for an office visit.  Assessment/Plan:  Chronic Problems Addressed Today: Hyperlipidemia Check lipids.  She is on atorvastatin 10 mg daily.  Debility Patient essentially bedbound due to debility.  Family is requesting home health referral.  We will place this today.  She is not able to leave home without assistance.  Requires close monitoring due to several medical comorbidities including heart failure.  Hypothyroidism, adult Check TSH.  Continue Synthroid 75 mcg daily.  Atrial fibrillation, chronic (HCC) Rate controlled.  On Xarelto.  Anemia Check iron panel and CBC today.  B12 deficiency Check B12.  Diabetes mellitus with coincident hypertension (HCC) Check A1c.  She is on metformin 500 mg daily.     Subjective:  HPI:  See A/p.         Objective:  Physical Exam: BP (!) 125/94   Pulse 88   Temp 97.9 F (36.6 C) (Temporal)   Ht 5' (1.524 m)   SpO2 98%   BMI 36.33 kg/m   Gen: No acute distress, resting comfortably CV: Irregular with no murmurs appreciated Pulm: Normal work of breathing, clear to auscultation bilaterally with no crackles, wheezes, or rhonchi Neuro: Grossly normal, moves all extremities Psych: Normal affect and thought content      Hadlea Furuya M. Jerline Pain, MD 01/21/2021 3:04 PM

## 2021-01-21 NOTE — Assessment & Plan Note (Signed)
Check B12 

## 2021-01-22 DIAGNOSIS — I152 Hypertension secondary to endocrine disorders: Secondary | ICD-10-CM | POA: Diagnosis not present

## 2021-01-22 DIAGNOSIS — E1159 Type 2 diabetes mellitus with other circulatory complications: Secondary | ICD-10-CM | POA: Diagnosis not present

## 2021-01-22 DIAGNOSIS — E039 Hypothyroidism, unspecified: Secondary | ICD-10-CM | POA: Diagnosis not present

## 2021-01-22 DIAGNOSIS — R5381 Other malaise: Secondary | ICD-10-CM | POA: Diagnosis not present

## 2021-01-22 DIAGNOSIS — Z8542 Personal history of malignant neoplasm of other parts of uterus: Secondary | ICD-10-CM | POA: Diagnosis not present

## 2021-01-22 DIAGNOSIS — I482 Chronic atrial fibrillation, unspecified: Secondary | ICD-10-CM | POA: Diagnosis not present

## 2021-01-22 DIAGNOSIS — I5043 Acute on chronic combined systolic (congestive) and diastolic (congestive) heart failure: Secondary | ICD-10-CM | POA: Diagnosis not present

## 2021-01-22 LAB — CBC
HCT: 40 % (ref 36.0–46.0)
Hemoglobin: 13.2 g/dL (ref 12.0–15.0)
MCHC: 33.1 g/dL (ref 30.0–36.0)
MCV: 95.3 fl (ref 78.0–100.0)
Platelets: 303 10*3/uL (ref 150.0–400.0)
RBC: 4.2 Mil/uL (ref 3.87–5.11)
RDW: 16.5 % — ABNORMAL HIGH (ref 11.5–15.5)
WBC: 9.8 10*3/uL (ref 4.0–10.5)

## 2021-01-22 LAB — MICROALBUMIN / CREATININE URINE RATIO
Creatinine,U: 50.5 mg/dL
Microalb Creat Ratio: 1.4 mg/g (ref 0.0–30.0)
Microalb, Ur: <0.7 mg/dL (ref 0.0–1.9)

## 2021-01-22 LAB — HEMOGLOBIN A1C: Hgb A1c MFr Bld: 6.8 % — ABNORMAL HIGH (ref 4.6–6.5)

## 2021-01-22 LAB — TSH: TSH: 2.86 u[IU]/mL (ref 0.35–4.50)

## 2021-01-22 LAB — LIPID PANEL
Cholesterol: 154 mg/dL (ref 0–200)
HDL: 39.9 mg/dL (ref >39.00)
LDL Cholesterol: 80 mg/dL (ref 0–99)
NonHDL: 114.29
Total CHOL/HDL Ratio: 4
Triglycerides: 170 mg/dL — ABNORMAL HIGH (ref 0.0–149.0)
VLDL: 34 mg/dL (ref 0.0–40.0)

## 2021-01-22 LAB — IBC + FERRITIN
Ferritin: 22.6 ng/mL (ref 10.0–291.0)
Iron: 68 ug/dL (ref 42–145)
Saturation Ratios: 18.7 % — ABNORMAL LOW (ref 20.0–50.0)
Transferrin: 260 mg/dL (ref 212.0–360.0)

## 2021-01-22 LAB — VITAMIN B12: Vitamin B-12: >1550 pg/mL — ABNORMAL HIGH (ref 211–911)

## 2021-01-23 LAB — URINE CULTURE
MICRO NUMBER:: 11983591
SPECIMEN QUALITY:: ADEQUATE

## 2021-01-23 NOTE — Progress Notes (Signed)
Please inform patient of the following:  Labs are all stable.  Continue current treatment plan and we can recheck again in a few months when she follows up with her PCP.

## 2021-01-24 NOTE — Progress Notes (Signed)
Please inform patient of the following:  Urine culture negative.  She does not have a UTI.

## 2021-01-27 ENCOUNTER — Ambulatory Visit: Payer: Medicare Other | Admitting: Family Medicine

## 2021-01-27 DIAGNOSIS — E039 Hypothyroidism, unspecified: Secondary | ICD-10-CM | POA: Diagnosis not present

## 2021-01-27 DIAGNOSIS — Z8542 Personal history of malignant neoplasm of other parts of uterus: Secondary | ICD-10-CM | POA: Diagnosis not present

## 2021-01-27 DIAGNOSIS — I5043 Acute on chronic combined systolic (congestive) and diastolic (congestive) heart failure: Secondary | ICD-10-CM | POA: Diagnosis not present

## 2021-01-27 DIAGNOSIS — E1159 Type 2 diabetes mellitus with other circulatory complications: Secondary | ICD-10-CM | POA: Diagnosis not present

## 2021-01-27 DIAGNOSIS — I152 Hypertension secondary to endocrine disorders: Secondary | ICD-10-CM | POA: Diagnosis not present

## 2021-01-27 DIAGNOSIS — I482 Chronic atrial fibrillation, unspecified: Secondary | ICD-10-CM | POA: Diagnosis not present

## 2021-01-31 ENCOUNTER — Telehealth: Payer: Self-pay

## 2021-01-31 DIAGNOSIS — Z8542 Personal history of malignant neoplasm of other parts of uterus: Secondary | ICD-10-CM | POA: Diagnosis not present

## 2021-01-31 DIAGNOSIS — I482 Chronic atrial fibrillation, unspecified: Secondary | ICD-10-CM | POA: Diagnosis not present

## 2021-01-31 DIAGNOSIS — I152 Hypertension secondary to endocrine disorders: Secondary | ICD-10-CM | POA: Diagnosis not present

## 2021-01-31 DIAGNOSIS — E039 Hypothyroidism, unspecified: Secondary | ICD-10-CM | POA: Diagnosis not present

## 2021-01-31 DIAGNOSIS — I5043 Acute on chronic combined systolic (congestive) and diastolic (congestive) heart failure: Secondary | ICD-10-CM | POA: Diagnosis not present

## 2021-01-31 DIAGNOSIS — E1159 Type 2 diabetes mellitus with other circulatory complications: Secondary | ICD-10-CM | POA: Diagnosis not present

## 2021-01-31 NOTE — Telephone Encounter (Signed)
Spoke with jim given ok for verbal order

## 2021-01-31 NOTE — Telephone Encounter (Signed)
.  Home Health verbal orders-caller/Agency:   Callback number:   Requesting OT/PT/Skilled nursing/Social Work/Speech:OT /Social work consult home health aide  Reason:shower transfer training / to help son get resources / Home health aide  Frequency: OT- 2 week 2  1 week 2  HHA-1 week 1 2 week 2

## 2021-02-03 DIAGNOSIS — E1159 Type 2 diabetes mellitus with other circulatory complications: Secondary | ICD-10-CM | POA: Diagnosis not present

## 2021-02-03 DIAGNOSIS — I5043 Acute on chronic combined systolic (congestive) and diastolic (congestive) heart failure: Secondary | ICD-10-CM | POA: Diagnosis not present

## 2021-02-03 DIAGNOSIS — I152 Hypertension secondary to endocrine disorders: Secondary | ICD-10-CM | POA: Diagnosis not present

## 2021-02-03 DIAGNOSIS — Z8542 Personal history of malignant neoplasm of other parts of uterus: Secondary | ICD-10-CM | POA: Diagnosis not present

## 2021-02-03 DIAGNOSIS — E039 Hypothyroidism, unspecified: Secondary | ICD-10-CM | POA: Diagnosis not present

## 2021-02-03 DIAGNOSIS — I482 Chronic atrial fibrillation, unspecified: Secondary | ICD-10-CM | POA: Diagnosis not present

## 2021-02-05 DIAGNOSIS — E1165 Type 2 diabetes mellitus with hyperglycemia: Secondary | ICD-10-CM | POA: Diagnosis not present

## 2021-02-05 DIAGNOSIS — E1136 Type 2 diabetes mellitus with diabetic cataract: Secondary | ICD-10-CM | POA: Diagnosis not present

## 2021-02-05 DIAGNOSIS — Z7901 Long term (current) use of anticoagulants: Secondary | ICD-10-CM | POA: Diagnosis not present

## 2021-02-05 DIAGNOSIS — I5043 Acute on chronic combined systolic (congestive) and diastolic (congestive) heart failure: Secondary | ICD-10-CM | POA: Diagnosis not present

## 2021-02-05 DIAGNOSIS — Z6841 Body Mass Index (BMI) 40.0 and over, adult: Secondary | ICD-10-CM | POA: Diagnosis not present

## 2021-02-05 DIAGNOSIS — M8008XD Age-related osteoporosis with current pathological fracture, vertebra(e), subsequent encounter for fracture with routine healing: Secondary | ICD-10-CM | POA: Diagnosis not present

## 2021-02-05 DIAGNOSIS — I482 Chronic atrial fibrillation, unspecified: Secondary | ICD-10-CM | POA: Diagnosis not present

## 2021-02-05 DIAGNOSIS — H9193 Unspecified hearing loss, bilateral: Secondary | ICD-10-CM | POA: Diagnosis not present

## 2021-02-05 DIAGNOSIS — H409 Unspecified glaucoma: Secondary | ICD-10-CM | POA: Diagnosis not present

## 2021-02-05 DIAGNOSIS — M80059D Age-related osteoporosis with current pathological fracture, unspecified femur, subsequent encounter for fracture with routine healing: Secondary | ICD-10-CM | POA: Diagnosis not present

## 2021-02-05 DIAGNOSIS — Z7984 Long term (current) use of oral hypoglycemic drugs: Secondary | ICD-10-CM | POA: Diagnosis not present

## 2021-02-05 DIAGNOSIS — R32 Unspecified urinary incontinence: Secondary | ICD-10-CM | POA: Diagnosis not present

## 2021-02-05 DIAGNOSIS — R1312 Dysphagia, oropharyngeal phase: Secondary | ICD-10-CM | POA: Diagnosis not present

## 2021-02-05 DIAGNOSIS — Z8542 Personal history of malignant neoplasm of other parts of uterus: Secondary | ICD-10-CM | POA: Diagnosis not present

## 2021-02-05 DIAGNOSIS — E039 Hypothyroidism, unspecified: Secondary | ICD-10-CM | POA: Diagnosis not present

## 2021-02-05 DIAGNOSIS — D649 Anemia, unspecified: Secondary | ICD-10-CM | POA: Diagnosis not present

## 2021-02-05 DIAGNOSIS — I152 Hypertension secondary to endocrine disorders: Secondary | ICD-10-CM | POA: Diagnosis not present

## 2021-02-05 DIAGNOSIS — E1159 Type 2 diabetes mellitus with other circulatory complications: Secondary | ICD-10-CM | POA: Diagnosis not present

## 2021-02-06 ENCOUNTER — Encounter: Payer: Medicare Other | Admitting: Family Medicine

## 2021-02-07 DIAGNOSIS — I152 Hypertension secondary to endocrine disorders: Secondary | ICD-10-CM | POA: Diagnosis not present

## 2021-02-07 DIAGNOSIS — I5043 Acute on chronic combined systolic (congestive) and diastolic (congestive) heart failure: Secondary | ICD-10-CM | POA: Diagnosis not present

## 2021-02-07 DIAGNOSIS — E1159 Type 2 diabetes mellitus with other circulatory complications: Secondary | ICD-10-CM | POA: Diagnosis not present

## 2021-02-07 DIAGNOSIS — I482 Chronic atrial fibrillation, unspecified: Secondary | ICD-10-CM | POA: Diagnosis not present

## 2021-02-07 DIAGNOSIS — E039 Hypothyroidism, unspecified: Secondary | ICD-10-CM | POA: Diagnosis not present

## 2021-02-07 DIAGNOSIS — Z8542 Personal history of malignant neoplasm of other parts of uterus: Secondary | ICD-10-CM | POA: Diagnosis not present

## 2021-02-10 DIAGNOSIS — I5043 Acute on chronic combined systolic (congestive) and diastolic (congestive) heart failure: Secondary | ICD-10-CM | POA: Diagnosis not present

## 2021-02-10 DIAGNOSIS — I152 Hypertension secondary to endocrine disorders: Secondary | ICD-10-CM | POA: Diagnosis not present

## 2021-02-10 DIAGNOSIS — E1159 Type 2 diabetes mellitus with other circulatory complications: Secondary | ICD-10-CM | POA: Diagnosis not present

## 2021-02-10 DIAGNOSIS — Z8542 Personal history of malignant neoplasm of other parts of uterus: Secondary | ICD-10-CM | POA: Diagnosis not present

## 2021-02-10 DIAGNOSIS — E039 Hypothyroidism, unspecified: Secondary | ICD-10-CM | POA: Diagnosis not present

## 2021-02-10 DIAGNOSIS — I482 Chronic atrial fibrillation, unspecified: Secondary | ICD-10-CM | POA: Diagnosis not present

## 2021-02-12 DIAGNOSIS — I5043 Acute on chronic combined systolic (congestive) and diastolic (congestive) heart failure: Secondary | ICD-10-CM | POA: Diagnosis not present

## 2021-02-12 DIAGNOSIS — Z8542 Personal history of malignant neoplasm of other parts of uterus: Secondary | ICD-10-CM | POA: Diagnosis not present

## 2021-02-12 DIAGNOSIS — I482 Chronic atrial fibrillation, unspecified: Secondary | ICD-10-CM | POA: Diagnosis not present

## 2021-02-12 DIAGNOSIS — I152 Hypertension secondary to endocrine disorders: Secondary | ICD-10-CM | POA: Diagnosis not present

## 2021-02-12 DIAGNOSIS — E039 Hypothyroidism, unspecified: Secondary | ICD-10-CM | POA: Diagnosis not present

## 2021-02-12 DIAGNOSIS — E1159 Type 2 diabetes mellitus with other circulatory complications: Secondary | ICD-10-CM | POA: Diagnosis not present

## 2021-02-19 DIAGNOSIS — Z8542 Personal history of malignant neoplasm of other parts of uterus: Secondary | ICD-10-CM | POA: Diagnosis not present

## 2021-02-19 DIAGNOSIS — I482 Chronic atrial fibrillation, unspecified: Secondary | ICD-10-CM | POA: Diagnosis not present

## 2021-02-19 DIAGNOSIS — E039 Hypothyroidism, unspecified: Secondary | ICD-10-CM | POA: Diagnosis not present

## 2021-02-19 DIAGNOSIS — E1159 Type 2 diabetes mellitus with other circulatory complications: Secondary | ICD-10-CM | POA: Diagnosis not present

## 2021-02-19 DIAGNOSIS — I5043 Acute on chronic combined systolic (congestive) and diastolic (congestive) heart failure: Secondary | ICD-10-CM | POA: Diagnosis not present

## 2021-02-19 DIAGNOSIS — I152 Hypertension secondary to endocrine disorders: Secondary | ICD-10-CM | POA: Diagnosis not present

## 2021-02-20 NOTE — Telephone Encounter (Signed)
Please advise 

## 2021-02-24 DIAGNOSIS — I152 Hypertension secondary to endocrine disorders: Secondary | ICD-10-CM | POA: Diagnosis not present

## 2021-02-24 DIAGNOSIS — Z8542 Personal history of malignant neoplasm of other parts of uterus: Secondary | ICD-10-CM | POA: Diagnosis not present

## 2021-02-24 DIAGNOSIS — I482 Chronic atrial fibrillation, unspecified: Secondary | ICD-10-CM | POA: Diagnosis not present

## 2021-02-24 DIAGNOSIS — E039 Hypothyroidism, unspecified: Secondary | ICD-10-CM | POA: Diagnosis not present

## 2021-02-24 DIAGNOSIS — I5043 Acute on chronic combined systolic (congestive) and diastolic (congestive) heart failure: Secondary | ICD-10-CM | POA: Diagnosis not present

## 2021-02-24 DIAGNOSIS — E1159 Type 2 diabetes mellitus with other circulatory complications: Secondary | ICD-10-CM | POA: Diagnosis not present

## 2021-02-25 ENCOUNTER — Other Ambulatory Visit: Payer: Self-pay | Admitting: *Deleted

## 2021-02-25 DIAGNOSIS — H401134 Primary open-angle glaucoma, bilateral, indeterminate stage: Secondary | ICD-10-CM | POA: Diagnosis not present

## 2021-02-25 DIAGNOSIS — H25042 Posterior subcapsular polar age-related cataract, left eye: Secondary | ICD-10-CM | POA: Diagnosis not present

## 2021-02-25 DIAGNOSIS — H524 Presbyopia: Secondary | ICD-10-CM | POA: Diagnosis not present

## 2021-02-25 DIAGNOSIS — M25559 Pain in unspecified hip: Secondary | ICD-10-CM

## 2021-02-25 DIAGNOSIS — M25562 Pain in left knee: Secondary | ICD-10-CM

## 2021-02-25 DIAGNOSIS — E119 Type 2 diabetes mellitus without complications: Secondary | ICD-10-CM | POA: Diagnosis not present

## 2021-02-25 LAB — HM DIABETES EYE EXAM

## 2021-02-25 NOTE — Telephone Encounter (Signed)
Referral placed.

## 2021-02-26 ENCOUNTER — Encounter: Payer: Self-pay | Admitting: Physician Assistant

## 2021-02-27 ENCOUNTER — Telehealth: Payer: Self-pay

## 2021-02-27 NOTE — Telephone Encounter (Signed)
Patient's son called and asked if there was any way an order be put in about having a physical therapist that can come to their home. He stated that it is very hard to transport [place] and she has to come through transportation services

## 2021-02-28 NOTE — Telephone Encounter (Signed)
Referral for home health PT was put in a month ago - can we check on this?  Algis Greenhouse. Jerline Pain, MD 02/28/2021 12:31 PM

## 2021-02-28 NOTE — Telephone Encounter (Signed)
Can you check on this referral

## 2021-02-28 NOTE — Telephone Encounter (Signed)
Dr.  Jerline Pain, please see message and advise if okay to do orders?

## 2021-03-07 ENCOUNTER — Telehealth: Payer: Self-pay

## 2021-03-07 NOTE — Telephone Encounter (Signed)
Patients son called in stating that patient had fallen this morning and hit her back across the bed.    Wanted to know if an xray could be ordered for her house.  I have advised that patient does need to be seen in order for an x ray to be completed.  I have advised son to go to [facility] or ED this evening with patient as there are no available appts within [place] at this time.  Son understood.

## 2021-03-10 ENCOUNTER — Ambulatory Visit: Payer: Medicare Other | Admitting: Physical Therapy

## 2021-03-12 NOTE — Telephone Encounter (Signed)
Spoke to pt's son [name] asked him if he took pt to Urgent Care to be evaluated. Prashant said no cause pt said she was okay, feeling better. He noticed this morning pt has a bruise and so he made an appt for next Tuesday. Told him okay.

## 2021-03-13 ENCOUNTER — Telehealth: Payer: Self-pay | Admitting: Internal Medicine

## 2021-03-13 DIAGNOSIS — H25812 Combined forms of age-related cataract, left eye: Secondary | ICD-10-CM | POA: Diagnosis not present

## 2021-03-13 DIAGNOSIS — H25012 Cortical age-related cataract, left eye: Secondary | ICD-10-CM | POA: Diagnosis not present

## 2021-03-13 DIAGNOSIS — H2512 Age-related nuclear cataract, left eye: Secondary | ICD-10-CM | POA: Diagnosis not present

## 2021-03-13 DIAGNOSIS — H25042 Posterior subcapsular polar age-related cataract, left eye: Secondary | ICD-10-CM | POA: Diagnosis not present

## 2021-03-13 NOTE — Telephone Encounter (Signed)
Error

## 2021-03-13 NOTE — Telephone Encounter (Signed)
Pts son was advised to call the office to report a HR in the low 100's today during her cataract surgery. She had no symptoms associated with elevated HR. Her HR has since returned to the 70's. She does have a hx of permanent AF.  Advised pt's son her HR is ok since she has recovered. If it increases >110bmp sustained, he should alert the office again. He agrees with this plan.

## 2021-03-13 NOTE — Telephone Encounter (Signed)
STAT if HR is under 50 or over 120 (normal HR is 60-100 beats per minute)  What is your heart rate? 78  Do you have a log of your heart rate readings (document readings)? 103, 104, 97  Do you have any other symptoms? no  Son called. Patient had surgery for cataract on L Eye this morning. The patient's HR was in the 103-104 range  before, during and after the procedure. The Eye Doctor advised the patient's son to reach out to the Cardiologist to let us know if the elevated HR. The son wanted to know if there needed to be any medication changes

## 2021-03-18 ENCOUNTER — Ambulatory Visit: Payer: Medicare Other | Admitting: Physician Assistant

## 2021-03-24 ENCOUNTER — Other Ambulatory Visit: Payer: Self-pay | Admitting: Physician Assistant

## 2021-03-24 ENCOUNTER — Telehealth: Payer: Self-pay | Admitting: Lab

## 2021-03-24 DIAGNOSIS — E119 Type 2 diabetes mellitus without complications: Secondary | ICD-10-CM

## 2021-03-24 NOTE — Chronic Care Management (AMB) (Signed)
  Chronic Care Management   Note  03/24/2021 Name: Caly Buenaflor MRN: [id number] DOB: 04-Nov-1945  Elzina Iliany Plowden is a 75 y.o. year old female who is a primary care patient of [name], [place]. I reached out to [employer] by phone today in response to a referral sent by Ms. Joyclyn Anil Zamor's PCP, [name], PA.   Ms. Krishnaswamy was given information about Chronic Care Management services today including:  CCM service includes personalized support from designated clinical staff supervised by her physician, including individualized plan of care and coordination with other care providers 24/7 contact phone numbers for assistance for urgent and routine care needs. Service will only be billed when office clinical staff spend 20 minutes or more in a month to coordinate care. Only one practitioner may furnish and bill the service in a calendar month. The patient may stop CCM services at any time (effective at the end of the month) by phone call to the office staff.   Patient agreed to services and verbal consent obtained.   Follow up plan:   [place]

## 2021-03-25 ENCOUNTER — Telehealth: Payer: Self-pay

## 2021-03-25 NOTE — Telephone Encounter (Signed)
LAST APPOINTMENT DATE:  01/21/21  NEXT APPOINTMENT DATE: 05/12/21  MEDICATION:metFORMIN (GLUCOPHAGE) 1000 MG tablet  [facility] - [place], [place]

## 2021-03-26 ENCOUNTER — Ambulatory Visit: Payer: Medicare Other | Admitting: Physician Assistant

## 2021-03-26 MED ORDER — METFORMIN HCL 500 MG PO TABS: 500.0000 mg | ORAL_TABLET | Freq: Every day | ORAL | 3 refills | Status: DC

## 2021-03-26 NOTE — Telephone Encounter (Signed)
Spoke to [place] pt's son told him received refill request for Metformin wanted to clarify dose. Is pt taking 1000 mg or 500 mg daily. Crystal Ashley said she is taking 500 mg daily. Told him okay will send Rx to mail order pharmacy. Prashant verbalized understanding.

## 2021-04-01 ENCOUNTER — Telehealth: Payer: Self-pay

## 2021-04-01 NOTE — Telephone Encounter (Signed)
Home Health Verbal Orders  Agency:  [facility] [name])   Requesting PT   Reason for Request:  Evaluation   Comments: asked for the orders to be faxed to [phone]   [facility] needs F2F w/in last 30 days

## 2021-04-01 NOTE — Telephone Encounter (Signed)
Called [name] with [facility], verbal orders given for PT evaluate and treat okay per [name], PA-C and Dr. Jerline Pain. [name] verbalized understanding.

## 2021-04-08 ENCOUNTER — Telehealth: Payer: Self-pay | Admitting: *Deleted

## 2021-04-08 DIAGNOSIS — E1169 Type 2 diabetes mellitus with other specified complication: Secondary | ICD-10-CM | POA: Diagnosis not present

## 2021-04-08 DIAGNOSIS — E559 Vitamin D deficiency, unspecified: Secondary | ICD-10-CM | POA: Diagnosis not present

## 2021-04-08 DIAGNOSIS — H409 Unspecified glaucoma: Secondary | ICD-10-CM | POA: Diagnosis not present

## 2021-04-08 DIAGNOSIS — E039 Hypothyroidism, unspecified: Secondary | ICD-10-CM | POA: Diagnosis not present

## 2021-04-08 DIAGNOSIS — Z8542 Personal history of malignant neoplasm of other parts of uterus: Secondary | ICD-10-CM | POA: Diagnosis not present

## 2021-04-08 DIAGNOSIS — Z9181 History of falling: Secondary | ICD-10-CM | POA: Diagnosis not present

## 2021-04-08 DIAGNOSIS — I5042 Chronic combined systolic (congestive) and diastolic (congestive) heart failure: Secondary | ICD-10-CM | POA: Diagnosis not present

## 2021-04-08 DIAGNOSIS — I4821 Permanent atrial fibrillation: Secondary | ICD-10-CM | POA: Diagnosis not present

## 2021-04-08 DIAGNOSIS — E785 Hyperlipidemia, unspecified: Secondary | ICD-10-CM | POA: Diagnosis not present

## 2021-04-08 DIAGNOSIS — E538 Deficiency of other specified B group vitamins: Secondary | ICD-10-CM | POA: Diagnosis not present

## 2021-04-08 DIAGNOSIS — Z7984 Long term (current) use of oral hypoglycemic drugs: Secondary | ICD-10-CM | POA: Diagnosis not present

## 2021-04-08 DIAGNOSIS — I110 Hypertensive heart disease with heart failure: Secondary | ICD-10-CM | POA: Diagnosis not present

## 2021-04-08 DIAGNOSIS — Z6833 Body mass index (BMI) 33.0-33.9, adult: Secondary | ICD-10-CM | POA: Diagnosis not present

## 2021-04-08 DIAGNOSIS — D500 Iron deficiency anemia secondary to blood loss (chronic): Secondary | ICD-10-CM | POA: Diagnosis not present

## 2021-04-08 DIAGNOSIS — Z7901 Long term (current) use of anticoagulants: Secondary | ICD-10-CM | POA: Diagnosis not present

## 2021-04-08 NOTE — Telephone Encounter (Signed)
Ok with me. Please place any necessary orders. 

## 2021-04-08 NOTE — Telephone Encounter (Signed)
Crystal Ashley [phone] Requesting VO for PT  2x per week for 3 weeks 1x per week for 3 weeks  Please advise

## 2021-04-08 NOTE — Telephone Encounter (Signed)
LVM with VO to Crystal Ashley

## 2021-04-11 DIAGNOSIS — E039 Hypothyroidism, unspecified: Secondary | ICD-10-CM | POA: Diagnosis not present

## 2021-04-11 DIAGNOSIS — I4821 Permanent atrial fibrillation: Secondary | ICD-10-CM | POA: Diagnosis not present

## 2021-04-11 DIAGNOSIS — E1169 Type 2 diabetes mellitus with other specified complication: Secondary | ICD-10-CM | POA: Diagnosis not present

## 2021-04-11 DIAGNOSIS — I5042 Chronic combined systolic (congestive) and diastolic (congestive) heart failure: Secondary | ICD-10-CM | POA: Diagnosis not present

## 2021-04-11 DIAGNOSIS — E785 Hyperlipidemia, unspecified: Secondary | ICD-10-CM | POA: Diagnosis not present

## 2021-04-11 DIAGNOSIS — I110 Hypertensive heart disease with heart failure: Secondary | ICD-10-CM | POA: Diagnosis not present

## 2021-04-15 DIAGNOSIS — E538 Deficiency of other specified B group vitamins: Secondary | ICD-10-CM | POA: Diagnosis not present

## 2021-04-15 DIAGNOSIS — E039 Hypothyroidism, unspecified: Secondary | ICD-10-CM | POA: Diagnosis not present

## 2021-04-15 DIAGNOSIS — E785 Hyperlipidemia, unspecified: Secondary | ICD-10-CM | POA: Diagnosis not present

## 2021-04-15 DIAGNOSIS — I5042 Chronic combined systolic (congestive) and diastolic (congestive) heart failure: Secondary | ICD-10-CM | POA: Diagnosis not present

## 2021-04-15 DIAGNOSIS — Z9181 History of falling: Secondary | ICD-10-CM

## 2021-04-15 DIAGNOSIS — E1169 Type 2 diabetes mellitus with other specified complication: Secondary | ICD-10-CM | POA: Diagnosis not present

## 2021-04-15 DIAGNOSIS — Z8542 Personal history of malignant neoplasm of other parts of uterus: Secondary | ICD-10-CM

## 2021-04-15 DIAGNOSIS — Z7984 Long term (current) use of oral hypoglycemic drugs: Secondary | ICD-10-CM

## 2021-04-15 DIAGNOSIS — I110 Hypertensive heart disease with heart failure: Secondary | ICD-10-CM | POA: Diagnosis not present

## 2021-04-15 DIAGNOSIS — Z6833 Body mass index (BMI) 33.0-33.9, adult: Secondary | ICD-10-CM | POA: Diagnosis not present

## 2021-04-15 DIAGNOSIS — D500 Iron deficiency anemia secondary to blood loss (chronic): Secondary | ICD-10-CM | POA: Diagnosis not present

## 2021-04-15 DIAGNOSIS — E559 Vitamin D deficiency, unspecified: Secondary | ICD-10-CM | POA: Diagnosis not present

## 2021-04-15 DIAGNOSIS — Z7901 Long term (current) use of anticoagulants: Secondary | ICD-10-CM

## 2021-04-15 DIAGNOSIS — I4821 Permanent atrial fibrillation: Secondary | ICD-10-CM | POA: Diagnosis not present

## 2021-04-15 DIAGNOSIS — H409 Unspecified glaucoma: Secondary | ICD-10-CM | POA: Diagnosis not present

## 2021-04-17 ENCOUNTER — Encounter: Payer: Self-pay | Admitting: Family Medicine

## 2021-04-17 DIAGNOSIS — I5042 Chronic combined systolic (congestive) and diastolic (congestive) heart failure: Secondary | ICD-10-CM | POA: Diagnosis not present

## 2021-04-17 DIAGNOSIS — E1169 Type 2 diabetes mellitus with other specified complication: Secondary | ICD-10-CM | POA: Diagnosis not present

## 2021-04-17 DIAGNOSIS — I110 Hypertensive heart disease with heart failure: Secondary | ICD-10-CM | POA: Diagnosis not present

## 2021-04-17 DIAGNOSIS — I4821 Permanent atrial fibrillation: Secondary | ICD-10-CM | POA: Diagnosis not present

## 2021-04-17 DIAGNOSIS — E785 Hyperlipidemia, unspecified: Secondary | ICD-10-CM | POA: Diagnosis not present

## 2021-04-17 DIAGNOSIS — E039 Hypothyroidism, unspecified: Secondary | ICD-10-CM | POA: Diagnosis not present

## 2021-04-19 ENCOUNTER — Other Ambulatory Visit: Payer: Self-pay | Admitting: Physician Assistant

## 2021-04-22 DIAGNOSIS — E1169 Type 2 diabetes mellitus with other specified complication: Secondary | ICD-10-CM | POA: Diagnosis not present

## 2021-04-22 DIAGNOSIS — E785 Hyperlipidemia, unspecified: Secondary | ICD-10-CM | POA: Diagnosis not present

## 2021-04-22 DIAGNOSIS — I4821 Permanent atrial fibrillation: Secondary | ICD-10-CM | POA: Diagnosis not present

## 2021-04-22 DIAGNOSIS — I110 Hypertensive heart disease with heart failure: Secondary | ICD-10-CM | POA: Diagnosis not present

## 2021-04-22 DIAGNOSIS — E039 Hypothyroidism, unspecified: Secondary | ICD-10-CM | POA: Diagnosis not present

## 2021-04-22 DIAGNOSIS — I5042 Chronic combined systolic (congestive) and diastolic (congestive) heart failure: Secondary | ICD-10-CM | POA: Diagnosis not present

## 2021-04-23 ENCOUNTER — Other Ambulatory Visit: Payer: Self-pay

## 2021-04-24 ENCOUNTER — Telehealth: Payer: Self-pay

## 2021-04-24 NOTE — Telephone Encounter (Signed)
Received a message from [place] from [place] [facility]. Patient has bilateral breakdown rash on her buttocks and back of her thighs. Needing a verbal order for a nurse to go out and access the patient.

## 2021-04-25 DIAGNOSIS — E785 Hyperlipidemia, unspecified: Secondary | ICD-10-CM | POA: Diagnosis not present

## 2021-04-25 DIAGNOSIS — I5042 Chronic combined systolic (congestive) and diastolic (congestive) heart failure: Secondary | ICD-10-CM | POA: Diagnosis not present

## 2021-04-25 DIAGNOSIS — I4821 Permanent atrial fibrillation: Secondary | ICD-10-CM | POA: Diagnosis not present

## 2021-04-25 DIAGNOSIS — E039 Hypothyroidism, unspecified: Secondary | ICD-10-CM | POA: Diagnosis not present

## 2021-04-25 DIAGNOSIS — E1169 Type 2 diabetes mellitus with other specified complication: Secondary | ICD-10-CM | POA: Diagnosis not present

## 2021-04-25 DIAGNOSIS — I110 Hypertensive heart disease with heart failure: Secondary | ICD-10-CM | POA: Diagnosis not present

## 2021-04-25 NOTE — Telephone Encounter (Signed)
Unable to reach [place] , [facility]

## 2021-04-25 NOTE — Telephone Encounter (Signed)
Verbal orders has been given

## 2021-04-27 DIAGNOSIS — I4821 Permanent atrial fibrillation: Secondary | ICD-10-CM | POA: Diagnosis not present

## 2021-04-27 DIAGNOSIS — E039 Hypothyroidism, unspecified: Secondary | ICD-10-CM | POA: Diagnosis not present

## 2021-04-27 DIAGNOSIS — I5042 Chronic combined systolic (congestive) and diastolic (congestive) heart failure: Secondary | ICD-10-CM | POA: Diagnosis not present

## 2021-04-27 DIAGNOSIS — E785 Hyperlipidemia, unspecified: Secondary | ICD-10-CM | POA: Diagnosis not present

## 2021-04-27 DIAGNOSIS — I110 Hypertensive heart disease with heart failure: Secondary | ICD-10-CM | POA: Diagnosis not present

## 2021-04-27 DIAGNOSIS — E1169 Type 2 diabetes mellitus with other specified complication: Secondary | ICD-10-CM | POA: Diagnosis not present

## 2021-04-28 ENCOUNTER — Telehealth: Payer: Self-pay

## 2021-04-28 NOTE — Telephone Encounter (Signed)
See note please advise

## 2021-04-28 NOTE — Telephone Encounter (Signed)
Tanya from [facility] called back to speak to a nurse about orders. Orders are for nursing once a week and other care. Lavella Lemons stated that Crystal Ashley's famaily has been using LMNOOP for a wound on her bottom. Lavella Lemons can be reached at [phone] with any questions. Please Advise.

## 2021-04-29 DIAGNOSIS — E1169 Type 2 diabetes mellitus with other specified complication: Secondary | ICD-10-CM | POA: Diagnosis not present

## 2021-04-29 DIAGNOSIS — E785 Hyperlipidemia, unspecified: Secondary | ICD-10-CM | POA: Diagnosis not present

## 2021-04-29 DIAGNOSIS — E039 Hypothyroidism, unspecified: Secondary | ICD-10-CM | POA: Diagnosis not present

## 2021-04-29 DIAGNOSIS — I5042 Chronic combined systolic (congestive) and diastolic (congestive) heart failure: Secondary | ICD-10-CM | POA: Diagnosis not present

## 2021-04-29 DIAGNOSIS — I4821 Permanent atrial fibrillation: Secondary | ICD-10-CM | POA: Diagnosis not present

## 2021-04-29 DIAGNOSIS — I110 Hypertensive heart disease with heart failure: Secondary | ICD-10-CM | POA: Diagnosis not present

## 2021-05-01 NOTE — Telephone Encounter (Signed)
Called Tanya at  [phone] no answer

## 2021-05-01 NOTE — Telephone Encounter (Signed)
Tanya called back to check on the orders.

## 2021-05-03 ENCOUNTER — Other Ambulatory Visit: Payer: Self-pay | Admitting: Family

## 2021-05-03 ENCOUNTER — Encounter: Payer: Self-pay | Admitting: Family Medicine

## 2021-05-05 ENCOUNTER — Other Ambulatory Visit: Payer: Self-pay

## 2021-05-05 DIAGNOSIS — I110 Hypertensive heart disease with heart failure: Secondary | ICD-10-CM | POA: Diagnosis not present

## 2021-05-05 DIAGNOSIS — I4821 Permanent atrial fibrillation: Secondary | ICD-10-CM | POA: Diagnosis not present

## 2021-05-05 DIAGNOSIS — E1169 Type 2 diabetes mellitus with other specified complication: Secondary | ICD-10-CM | POA: Diagnosis not present

## 2021-05-05 DIAGNOSIS — I5042 Chronic combined systolic (congestive) and diastolic (congestive) heart failure: Secondary | ICD-10-CM | POA: Diagnosis not present

## 2021-05-05 DIAGNOSIS — E785 Hyperlipidemia, unspecified: Secondary | ICD-10-CM | POA: Diagnosis not present

## 2021-05-05 DIAGNOSIS — E039 Hypothyroidism, unspecified: Secondary | ICD-10-CM | POA: Diagnosis not present

## 2021-05-05 NOTE — Telephone Encounter (Signed)
Tried to contact Lavella Lemons again at [phone], no answer and no machine to leave a message.

## 2021-05-06 DIAGNOSIS — E785 Hyperlipidemia, unspecified: Secondary | ICD-10-CM | POA: Diagnosis not present

## 2021-05-06 DIAGNOSIS — I5042 Chronic combined systolic (congestive) and diastolic (congestive) heart failure: Secondary | ICD-10-CM | POA: Diagnosis not present

## 2021-05-06 DIAGNOSIS — I4821 Permanent atrial fibrillation: Secondary | ICD-10-CM | POA: Diagnosis not present

## 2021-05-06 DIAGNOSIS — E1169 Type 2 diabetes mellitus with other specified complication: Secondary | ICD-10-CM | POA: Diagnosis not present

## 2021-05-06 DIAGNOSIS — I110 Hypertensive heart disease with heart failure: Secondary | ICD-10-CM | POA: Diagnosis not present

## 2021-05-06 DIAGNOSIS — E039 Hypothyroidism, unspecified: Secondary | ICD-10-CM | POA: Diagnosis not present

## 2021-05-07 ENCOUNTER — Telehealth: Payer: Self-pay | Admitting: Pharmacist

## 2021-05-07 NOTE — Progress Notes (Addendum)
Chronic Care Management Pharmacy Assistant   Name: Crystal Ashley  MRN: [id number] DOB: 19-Dec-1945  Crystal Ashley is an 75 y.o. year old female who presents for his initial CCM visit with the clinical pharmacist.  Reason for Encounter: Chart Review For Initial Appointment With Clinical Pharmacist   Conditions to be addressed/monitored: Atrial Fibrillation, HTN, HLD, DM II, Heart Failure, Hypothyroidism  Primary concerns for visit include: DM II. HLD, HTN  Recent office visits:  02/03/2021 Crystal Ashley, Crystal Ashley, no further information available.  01/31/2021 Crystal Ashley, no further information available.  01/27/2021 Crystal Ashley, no further information available.  01/22/2021 Crystal Ashley, no further information available.  01/21/2021 Crystal Sacramento, MD; no medication changes indicated.  01/20/2021 Crystal Ashley, no further information available.  01/14/2021 Crystal Ashley, no further information available.  01/07/2021 Crystal Ashley, no further information available.  01/06/2021 Crystal Ashley, no further information available.  01/03/2021 Crystal Ashley, no further information available.  01/01/2021 Crystal Ashley, no further information available.  12/30/2020 Crystal Ashley, no further information available.  12/25/2020 Crystal Ashley, no further information available.  12/24/2020 Crystal Ashley, no further information available.  12/23/2020 Crystal Ashley, no further information available.  12/18/2020 Crystal Ashley, no further information available.  12/17/2020 Crystal Ashley, no further information available.  12/16/2020 Crystal Ashley, no further information available.  12/11/2020 Crystal Ashley, no further information available.  12/10/2020 Crystal Ashley, no further information available.  Recent consult visits:  02/25/2021 OV (ophthalmology)  Crystal Ashley, no further information available  11/19/2020 OV (cardiology) Crystal Lean, MD; no medication changes indicated.   Hospital visits:  None in previous 6 months  Medications: Outpatient Encounter Medications as of 05/07/2021  Medication Sig   pantoprazole (PROTONIX) 40 MG tablet TAKE 1 TABLET EVERY DAY (PLEASE REFILL WITH PRIMARY CARE PROVIDER FOR ANYMORE REFILLS)   atorvastatin (LIPITOR) 10 MG tablet TAKE 1 TABLET EVERY DAY   Cholecalciferol (VITAMIN D3 GUMMIES ADULT PO) Take 2 tablets by mouth daily in the afternoon.   docusate sodium (COLACE) 100 MG capsule TAKE 1 CAPSULE TWICE DAILY   dorzolamide-timolol (COSOPT) 22.3-6.8 MG/ML ophthalmic solution Place 1 drop into both eyes daily.   ferrous sulfate 220 (44 Fe) MG/5ML solution Take 220 mg by mouth daily.   furosemide (LASIX) 40 MG tablet Take 1 tablet (40 mg total) by mouth daily. Change to every other day after 10 Lbs weight loss  08/06/2020   levothyroxine (SYNTHROID) 75 MCG tablet TAKE 1 TABLET EVERY DAY BEFORE BREAKFAST   Menthol, Topical Analgesic, (BIOFREEZE) 4 % GEL Apply 1 application topically daily as needed (pain). Apply to right side rib cage and lateral thigh   metFORMIN (GLUCOPHAGE) 500 MG tablet Take 1 tablet (500 mg total) by mouth daily with breakfast.   metoprolol succinate (TOPROL XL) 25 MG 24 hr tablet Take 1 tablet (25 mg total) by mouth daily.   Multiple Vitamins-Minerals (MULTI-VITAMIN GUMMIES PO) Take 2 tablets by mouth daily.   potassium chloride 20 MEQ/15ML (10%) SOLN Take 15 mLs (20 mEq total) by mouth daily.   rivaroxaban (XARELTO) 20 MG TABS tablet Take 1 tablet (20 mg total) by mouth daily with supper.   spironolactone (ALDACTONE) 25 MG tablet Take 1 tablet (25 mg total) by mouth daily.   travoprost, benzalkonium, (TRAVATAN) 0.004 % ophthalmic solution Place 1 drop into both eyes at bedtime.    vitamin B-12 (CYANOCOBALAMIN) 500 MCG  tablet Take 500 mcg by mouth daily.    No  facility-administered encounter medications on file as of 05/07/2021.   Current Medications: Pantoprazole 40 mg last filled 01/23/2021 90 DS Docusate sodium 100 mg two capsules in the morning Metformin 500 mg last filled 08/23/2020 90 DS - had extra on hand, last filled date 03/22/2021 Atorvastatin 10 mg last filled 01/14/2021 90 DS Levothyroxine 75 mg last filled 01/14/2021 90 DS Spironolactone 25 mg last filled 01/09/2021 90 DS Metoprolol Succinate 25 mg last filled 01/09/2021 90 DS Xarelto 20 mg last filled 01/03/2021 90 DS Furosemide 40 mg last filled 02/08/2021 90 DS Multiple Vitamin gummies one a day Ferrous Sulfate 220 mg - iron concentrate takes daily Vitamin D3 2000 u gummies once a day Potassium Chloride 20 meq last filled 01/02/2021 90 DS Vitamin B 12 500 mcg once a day - buys otc BIOFREEZE -hasn't used in a whole Dorzolamide-timolol 22.3-6.8 mg/mL ophthalmic solution last filled 12/29/2019 - two times daily - has extra due to being at rehab center and was given samples Travoprost 0.004% ophthalmic solution last filled 09/28/2019 90 DS - takes at night, was given samples   Patient Questions:  Any changes in your medications or health? Patients son states the patient has not had any changes in her medications or health recently.  Any side effects from any medications?  Patients son states the patient does not currently have any side effects from any of her medications.  Do you have any symptoms or problems not managed by your medications? Patients son states the patient does not have any symptoms or problems that are not currently managed by her medications.  Any concerns about your health right now? Patients son states the patient does not have any concerns with her health at this time.  Has your provider asked that you check blood pressure, blood sugar, or follow special diet at home? Patients son states she checks blood sugar and blood pressure at home. She eats a strict  vegetarian diet with occasional sea food.  Do you get any type of exercise on a regular basis? Patients son states the patient walks daily, and also does stretching  Can you think of a goal you would like to reach for your health? Patients son states their goal is trying to get in and out of bed by herself. Going to the bathroom instead of the commode in the room - currently using sit to stand to help her get up and get around.  Do you have any problems getting your medications? Patients son helps manage her medications. He states he fills up her medication dispense pack for two weeks at a time.  Is there anything that you would like to discuss during the appointment?  Patients son states she doesn't have anything she would like to discuss at this time.  Please bring medications and supplements to appointment   Care Gaps: Medicare Annual Wellness: due now - scheduled for 05/26/21 at 1:45 pm Ophthalmology Exam: Next due on 02/25/2022 Foot Exam: [place] since 06/15/2019 Hemoglobin A1C: 6.8% on 01/21/2021 Urine Microalbumin: Next due on 01/22/2022 Colonoscopy: never done Fecal DNA Cologuard: Overdue - never done Tetanus/DTAP: Overdue - never done Dexa Scan: Ordered on 08/20/2020 Mammogram: has not had one recently per patient son HPV Vaccines: Aged Out  Future Appointments  Date Time Provider [place]  05/12/2021  9:40 AM Vivi Barrack, MD [facility]  05/12/2021  2:00 PM [facility] CCM PHARMACIST [facility]  05/26/2021  1:45 PM  [facility] HEALTH COACH [facility]  06/02/2021  9:40 AM Crystal Lean, MD [facility]    Star Rating Drugs: Metformin 500 mg last filled 08/23/2020 90 DS Atorvastatin 10 mg last filled 01/14/2021 90 DS  April D Calhoun, [place] Pharmacist Assistant [phone]

## 2021-05-08 DIAGNOSIS — H409 Unspecified glaucoma: Secondary | ICD-10-CM | POA: Diagnosis not present

## 2021-05-08 DIAGNOSIS — Z7984 Long term (current) use of oral hypoglycemic drugs: Secondary | ICD-10-CM | POA: Diagnosis not present

## 2021-05-08 DIAGNOSIS — Z7901 Long term (current) use of anticoagulants: Secondary | ICD-10-CM | POA: Diagnosis not present

## 2021-05-08 DIAGNOSIS — Z8542 Personal history of malignant neoplasm of other parts of uterus: Secondary | ICD-10-CM | POA: Diagnosis not present

## 2021-05-08 DIAGNOSIS — D500 Iron deficiency anemia secondary to blood loss (chronic): Secondary | ICD-10-CM | POA: Diagnosis not present

## 2021-05-08 DIAGNOSIS — E039 Hypothyroidism, unspecified: Secondary | ICD-10-CM | POA: Diagnosis not present

## 2021-05-08 DIAGNOSIS — E559 Vitamin D deficiency, unspecified: Secondary | ICD-10-CM | POA: Diagnosis not present

## 2021-05-08 DIAGNOSIS — I5042 Chronic combined systolic (congestive) and diastolic (congestive) heart failure: Secondary | ICD-10-CM | POA: Diagnosis not present

## 2021-05-08 DIAGNOSIS — Z6833 Body mass index (BMI) 33.0-33.9, adult: Secondary | ICD-10-CM | POA: Diagnosis not present

## 2021-05-08 DIAGNOSIS — E1169 Type 2 diabetes mellitus with other specified complication: Secondary | ICD-10-CM | POA: Diagnosis not present

## 2021-05-08 DIAGNOSIS — I110 Hypertensive heart disease with heart failure: Secondary | ICD-10-CM | POA: Diagnosis not present

## 2021-05-08 DIAGNOSIS — I4821 Permanent atrial fibrillation: Secondary | ICD-10-CM | POA: Diagnosis not present

## 2021-05-08 DIAGNOSIS — E785 Hyperlipidemia, unspecified: Secondary | ICD-10-CM | POA: Diagnosis not present

## 2021-05-08 DIAGNOSIS — E538 Deficiency of other specified B group vitamins: Secondary | ICD-10-CM | POA: Diagnosis not present

## 2021-05-08 DIAGNOSIS — Z9181 History of falling: Secondary | ICD-10-CM | POA: Diagnosis not present

## 2021-05-08 NOTE — Progress Notes (Deleted)
Chronic Care Management Pharmacy Note  05/08/2021 Name:  Crystal Ashley MRN:  [id number] DOB:  August 27, 1945  Summary: ***  Recommendations/Changes made from today's visit: ***  Plan: ***   Subjective: Crystal Ashley is an 75 y.o. year old female who is a primary patient of [name], [place].  The CCM team was consulted for assistance with disease management and care coordination needs.    Engaged with patient by telephone for initial visit in response to provider referral for pharmacy case management and/or care coordination services.   Consent to Services:  The patient was given the following information about Chronic Care Management services today, agreed to services, and gave verbal consent: 1. CCM service includes personalized support from designated clinical staff supervised by the primary care provider, including individualized plan of care and coordination with other care providers 2. 24/7 contact phone numbers for assistance for urgent and routine care needs. 3. Service will only be billed when office clinical staff spend 20 minutes or more in a month to coordinate care. 4. Only one practitioner may furnish and bill the service in a calendar month. 5.The patient may stop CCM services at any time (effective at the end of the month) by phone call to the office staff. 6. The patient will be responsible for cost sharing (co-pay) of up to 20% of the service fee (after annual deductible is met). Patient agreed to services and consent obtained.  Patient Care Team: [name], [place] as PCP - General (Physician Assistant) [name], MD as PCP - Cardiology (Cardiology) [name], MD as Consulting Physician (Ophthalmology) Adrian Prows, MD as Consulting Physician (Cardiology) Edythe Clarity, [facility] as Pharmacist (Pharmacist)  Recent office visits: None available  Recent consult visits:  02/25/2021 OV (ophthalmology) [name], no further  information available   11/19/2020 OV (cardiology) [name], MD; no medication changes indicated.     Hospital visits:  None in previous 6 months   Objective:  Lab Results  Component Value Date   CREATININE 0.97 11/19/2020   BUN 14 11/19/2020   GFR 41.63 (L) 01/17/2019   GFRNONAA >60 07/26/2020   GFRAA >60 02/28/2020   NA 137 11/19/2020   K 4.4 11/19/2020   CALCIUM 9.6 11/19/2020   CO2 24 11/19/2020   GLUCOSE 140 (H) 11/19/2020    Lab Results  Component Value Date/Time   HGBA1C 6.8 (H) 01/21/2021 03:13 PM   HGBA1C 6.0 (H) 07/22/2020 04:39 AM   HGBA1C 6.5 04/08/2015 12:00 AM   GFR 41.63 (L) 01/17/2019 03:28 PM   GFR 61.43 09/20/2018 03:00 PM   MICROALBUR <0.7 01/22/2021 11:31 AM    Last diabetic Eye exam:  Lab Results  Component Value Date/Time   HMDIABEYEEXA No Retinopathy 02/25/2021 12:00 AM    Last diabetic Foot exam: No results found for: HMDIABFOOTEX   Lab Results  Component Value Date   CHOL 154 01/21/2021   HDL 39.90 01/21/2021   LDLCALC 80 01/21/2021   TRIG 170.0 (H) 01/21/2021   CHOLHDL 4 01/21/2021    Hepatic Function Latest Ref Rng & Units 11/19/2020 07/26/2020 07/25/2020  Total Protein 6.0 - 8.5 g/dL 7.0 6.1(L) 6.9  Albumin 3.7 - 4.7 g/dL 4.0 2.5(L) 2.8(L)  AST 0 - 40 IU/L 18 13(L) 15  ALT 0 - 32 IU/L '11 14 13  ' Alk Phosphatase 44 - 121 IU/L 117 71 77  Total Bilirubin 0.0 - 1.2 mg/dL 0.4 0.7 0.9    Lab Results  Component Value Date/Time  TSH 2.86 01/21/2021 03:13 PM   TSH 3.210 11/19/2020 03:28 PM   FREET4 1.07 03/15/2018 01:17 PM    CBC Latest Ref Rng & Units 01/21/2021 11/19/2020 07/25/2020  WBC 4.0 - 10.5 K/uL 9.8 9.9 11.2(H)  Hemoglobin 12.0 - 15.0 g/dL 13.2 13.0 9.0(L)  Hematocrit 36.0 - 46.0 % 40.0 40.1 31.3(L)  Platelets 150.0 - 400.0 K/uL 303.0 337 387    Lab Results  Component Value Date/Time   VD25OH 51.67 02/13/2020 05:48 AM   VD25OH 71.88 11/29/2019 09:44 AM   VD25OH 55.94 05/05/2017 09:51 AM    Clinical  ASCVD: {YES/NO:21197} The 10-year ASCVD risk score (Arnett DK, et al., 2019) is: 34.5%   Values used to calculate the score:     Age: 70 years     Sex: Female     Is Non-Hispanic African American: No     Diabetic: Yes     Tobacco smoker: No     Systolic Blood Pressure: 371 mmHg     Is BP treated: Yes     HDL Cholesterol: 39.9 mg/dL     Total Cholesterol: 154 mg/dL    Depression screen [facility] 2/9 01/21/2021 11/29/2019 06/14/2018  Decreased Interest 0 0 0  Down, Depressed, Hopeless 0 0 0  PHQ - 2 Score 0 0 0     ***Other: (CHADS2VASc if Afib, MMRC or CAT for COPD, ACT, DEXA)  Social History   Tobacco Use  Smoking Status Never  Smokeless Tobacco Never   BP Readings from Last 3 Encounters:  01/21/21 (!) 125/94  11/19/20 112/60  08/07/20 110/60   Pulse Readings from Last 3 Encounters:  01/21/21 88  11/19/20 (!) 110  08/07/20 70   Wt Readings from Last 3 Encounters:  08/06/20 186 lb (84.4 kg)  07/25/20 186 lb (84.4 kg)  05/29/20 186 lb 8.2 oz (84.6 kg)   BMI Readings from Last 3 Encounters:  01/21/21 36.33 kg/m  11/19/20 36.33 kg/m  08/06/20 36.33 kg/m    Assessment/Interventions: Review of patient past medical history, allergies, medications, health status, including review of consultants reports, laboratory and other test data, was performed as part of comprehensive evaluation and provision of chronic care management services.   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   Amiodarone     Nausea    Medications Reviewed Today     Reviewed by Jeremy Johann, CMA (Certified Medical Assistant) on 11/19/20 at 1442  Med List Status: <None>   Medication Order Taking? Sig Documenting Provider Last Dose Status Informant  atorvastatin (LIPITOR) 10 MG tablet [id number] Yes Take 1 tablet (10 mg total) by mouth daily. [name], [place] Taking Active Child  Cholecalciferol (VITAMIN D3 GUMMIES ADULT PO) [id number] Yes Take 2 tablets by mouth daily in the afternoon. [provider] Taking Active   docusate sodium (COLACE) 100  MG capsule [id number] Yes Take 1 capsule (100 mg total) by mouth 2 (two) times daily. [name], [place] Taking Active   dorzolamide-timolol (COSOPT) 22.3-6.8 MG/ML ophthalmic solution [id number] Yes Place 1 drop into both eyes daily. [provider] Taking Active Child  ferrous sulfate 220 (44 Fe) MG/5ML solution [id number] Yes Take 220 mg by mouth daily. [provider] Taking Active   furosemide (LASIX) 40 MG tablet [id number] Yes Take 1 tablet (40 mg total) by mouth daily. Change to every other day after 10 Lbs weight loss  08/06/2020 Adrian Prows, MD Taking Active   levothyroxine (SYNTHROID) 75 MCG tablet [id number] Yes Take 1 tablet (75 mcg total) by mouth daily before breakfast. [name], [place] Taking Active Child  Menthol, Topical Analgesic, (BIOFREEZE) 4 % GEL [id number] Yes Apply 1 application topically daily as needed (pain). Apply to right side rib cage and lateral thigh [provider] Taking Active Child  metFORMIN (GLUCOPHAGE) 1000 MG tablet [id number] Yes Take 1 tablet (1,000 mg total) by mouth daily with breakfast.  Patient taking differently: Take 500 mg by mouth daily with breakfast.   [name], PA Taking Active Child  metoprolol succinate (TOPROL XL)  25 MG 24 hr tablet [id number]  Take 1 tablet (25 mg total) by mouth daily. Adrian Prows, MD  Active   Multiple Vitamins-Minerals (MULTI-VITAMIN GUMMIES PO) [id number] Yes Take 2 tablets by mouth daily. [provider] Taking Active   pantoprazole (PROTONIX) 40 MG tablet [id number] Yes Take 1 tablet (40 mg total) by mouth daily. [name], [place] Taking Active   potassium chloride 20 MEQ/15ML (10%) SOLN [id number] Yes Take 15 mLs (20 mEq total) by mouth daily. [name], [place] Taking Active   rivaroxaban (XARELTO) 20 MG TABS tablet [id number] Yes Take 1 tablet (20 mg total) by mouth daily with supper.  Patient taking differently: Take 20 mg by mouth daily.   Adrian Prows, MD Taking Active   spironolactone (ALDACTONE) 25 MG tablet [id number]  Take 1 tablet (25 mg total) by mouth daily. Adrian Prows, MD  Active   travoprost, benzalkonium, (TRAVATAN) 0.004 % ophthalmic solution [id number] Yes Place 1 drop into both eyes at bedtime.  [provider] Taking Active Child  vitamin B-12 (CYANOCOBALAMIN) 500 MCG tablet [id number] Yes Take 500 mcg by mouth daily.  [provider] Taking Active Child            Patient Active Problem List   Diagnosis Date Noted   Permanent atrial fibrillation ([place]) 11/19/2020   Heart failure, type unknown ([place]) 11/19/2020   Hyperlipidemia 11/19/2020   Bilateral pleural effusion 07/21/2020   Pulmonary edema 07/21/2020   Chronic blood loss anemia 07/21/2020   Controlled type 2 diabetes mellitus with hyperglycemia ([place]) 07/21/2020   Hypothyroidism, adult 07/21/2020   Debility 07/21/2020   Pressure injury of skin 05/27/2020   SOB (shortness of breath) 05/26/2020   Fusion of spine, thoracolumbar region 02/26/2020   Controlled type 2 diabetes mellitus with hyperglycemia, without long-term current use of insulin ([place])    Fracture of vertebra due to osteoporosis ([place])    Multiple traumatic injuries 02/12/2020   Closed fracture of multiple pubic  rami, right, initial encounter ([place]) 02/07/2020   Atrial fibrillation, chronic ([place]) 02/07/2020   Chronic combined systolic (congestive) and diastolic (congestive) heart failure ([place])    Declining mobility 09/23/2018   B12 deficiency 09/23/2018   Anemia 09/23/2018   History of cardioversion 04/26/2018   Type 2 diabetes mellitus without complication, without long-term current use of  insulin ([place]) 03/15/2018   Atrial fibrillation with RVR ([place]) 03/15/2018   Morbid obesity ([place]) 01/04/2017   Hyperlipidemia associated with type 2 diabetes mellitus ([place]) 01/04/2017   Vitamin D deficiency 12/15/2016   Glaucoma    Diabetes mellitus with coincident hypertension ([place]) 04/19/2014   Hypothyroidism 04/19/2014   History of endometrial cancer 03/30/2014    Immunization History  Administered Date(s) Administered   Fluad Quad(high Dose 65+) 05/25/2019, 05/28/2020   Influenza, High Dose Seasonal PF 06/07/2017, 06/14/2018   PFIZER(Purple Top)SARS-COV-2 Vaccination 10/16/2019, 11/14/2019, 06/06/2020    Conditions to be addressed/monitored:  Afib, HTN, HF, Type II DM, HLD, Hypothyroidism  There are no care plans that you recently modified to display for this patient.    Medication Assistance: {MEDASSISTANCEINFO:25044}  Compliance/Adherence/Medication fill history: Care Gaps: ***  Star-Rating Drugs: ***  Patient's preferred pharmacy is:  [place], [place] - [address] [address] Phone: [phone] Fax: [phone]  [facility], [address] [place] [address] Phone: [phone] Fax: [phone]  Uses pill box? {Yes or If no, why not?:20788} Pt endorses ***% compliance  We discussed: {Pharmacy options:24294} Patient decided to: {US Pharmacy Plan:23885}  Care Plan and Follow Up Patient Decision:  {FOLLOWUP:24991}  Plan: {CM FOLLOW UP  TJQZ:00923}  ***  Current Barriers:  {pharmacybarriers:24917}  Pharmacist Clinical Goal(s):  Patient will {PHARMACYGOALCHOICES:24921} through collaboration with PharmD and provider.   Interventions: 1:1 collaboration with [name], PA regarding development and update of comprehensive plan of care as evidenced by provider attestation and co-signature Inter-disciplinary care team collaboration (see longitudinal plan of care) Comprehensive medication review performed; medication list updated in electronic medical record  Hypertension (BP goal {CHL HP UPSTREAM Pharmacist BP [phone]}) -{US controlled/uncontrolled:25276} -Current treatment: *** -Medications previously tried: ***  -Current home readings: *** -Current dietary habits: *** -Current exercise habits: *** -{ACTIONS;DENIES/REPORTS:21021675::"Denies"} hypotensive/hypertensive symptoms -Educated on {CCM BP Counseling:25124} -Counseled to monitor BP at home ***, document, and provide log at future appointments -{CCMPHARMDINTERVENTION:25122}  Hyperlipidemia: (LDL goal < ***) -{US controlled/uncontrolled:25276} -Current treatment: *** -Medications previously tried: ***  -Current dietary patterns: *** -Current exercise habits: *** -Educated on {CCM HLD Counseling:25126} -{CCMPHARMDINTERVENTION:25122}  Diabetes (A1c goal {A1c goals:23924}) -{US controlled/uncontrolled:25276} -Current medications: *** -Medications previously tried: ***  -Current home glucose readings fasting glucose: *** post prandial glucose: *** -{ACTIONS;DENIES/REPORTS:21021675::"Denies"} hypoglycemic/hyperglycemic symptoms -Current meal patterns:  breakfast: ***  lunch: ***  dinner: *** snacks: *** drinks: *** -Current exercise: *** -Educated on {CCM DM COUNSELING:25123} -Counseled to check feet daily and get yearly eye exams -{CCMPHARMDINTERVENTION:25122}  Atrial Fibrillation (Goal: prevent stroke and major bleeding) -{US  controlled/uncontrolled:25276} -CHADSVASC: *** -Current treatment: Rate control: *** Anticoagulation: *** -Medications previously tried: *** -Home BP and HR readings: ***  -Counseled on {CCMAFIBCOUNSELING:25120} -{CCMPHARMDINTERVENTION:25122}  Heart Failure (Goal: manage symptoms and prevent exacerbations) -{US controlled/uncontrolled:25276} -Last ejection fraction: *** (Date: ***) -HF type: {type of heart failure:30421350} -NYHA Class: {CHL HP Upstream Pharm NYHA Class:[phone]} -AHA HF Stage: {CHL HP Upstream Pharm AHA HF Stage:[id number]} -Current treatment: *** -Medications previously tried: ***  -Current home BP/HR readings: *** -Current dietary habits: *** -Current exercise habits: *** -Educated on {CCM HF Counseling:25125} -{CCMPHARMDINTERVENTION:25122}  Hypothyroidism (Goal: ***) -{US controlled/uncontrolled:25276} -Current treatment  *** -Medications previously tried: ***  -{CCMPHARMDINTERVENTION:25122}  Patient Goals/Self-Care Activities Patient will:  - {pharmacypatientgoals:24919}  Follow Up Plan: {CM FOLLOW UP RAQT:62263}

## 2021-05-10 ENCOUNTER — Encounter: Payer: Self-pay | Admitting: Family Medicine

## 2021-05-12 ENCOUNTER — Encounter: Payer: Self-pay | Admitting: Family Medicine

## 2021-05-12 ENCOUNTER — Ambulatory Visit (INDEPENDENT_AMBULATORY_CARE_PROVIDER_SITE_OTHER): Payer: Medicare Other | Admitting: Family Medicine

## 2021-05-12 ENCOUNTER — Telehealth: Payer: Medicare Other

## 2021-05-12 VITALS — BP 136/98 | HR 96 | Temp 98.2°F | Ht 60.0 in | Wt 173.0 lb

## 2021-05-12 DIAGNOSIS — E785 Hyperlipidemia, unspecified: Secondary | ICD-10-CM

## 2021-05-12 DIAGNOSIS — E538 Deficiency of other specified B group vitamins: Secondary | ICD-10-CM

## 2021-05-12 DIAGNOSIS — E7800 Pure hypercholesterolemia, unspecified: Secondary | ICD-10-CM | POA: Diagnosis not present

## 2021-05-12 DIAGNOSIS — I110 Hypertensive heart disease with heart failure: Secondary | ICD-10-CM | POA: Diagnosis not present

## 2021-05-12 DIAGNOSIS — E039 Hypothyroidism, unspecified: Secondary | ICD-10-CM | POA: Diagnosis not present

## 2021-05-12 DIAGNOSIS — E1165 Type 2 diabetes mellitus with hyperglycemia: Secondary | ICD-10-CM

## 2021-05-12 DIAGNOSIS — Z23 Encounter for immunization: Secondary | ICD-10-CM

## 2021-05-12 DIAGNOSIS — R5381 Other malaise: Secondary | ICD-10-CM

## 2021-05-12 DIAGNOSIS — Z6841 Body Mass Index (BMI) 40.0 and over, adult: Secondary | ICD-10-CM

## 2021-05-12 DIAGNOSIS — I482 Chronic atrial fibrillation, unspecified: Secondary | ICD-10-CM | POA: Diagnosis not present

## 2021-05-12 DIAGNOSIS — I4821 Permanent atrial fibrillation: Secondary | ICD-10-CM | POA: Diagnosis not present

## 2021-05-12 DIAGNOSIS — J441 Chronic obstructive pulmonary disease with (acute) exacerbation: Secondary | ICD-10-CM | POA: Diagnosis not present

## 2021-05-12 DIAGNOSIS — E1169 Type 2 diabetes mellitus with other specified complication: Secondary | ICD-10-CM

## 2021-05-12 DIAGNOSIS — E559 Vitamin D deficiency, unspecified: Secondary | ICD-10-CM

## 2021-05-12 DIAGNOSIS — I5042 Chronic combined systolic (congestive) and diastolic (congestive) heart failure: Secondary | ICD-10-CM | POA: Diagnosis not present

## 2021-05-12 LAB — CBC
HCT: 38 % (ref 36.0–46.0)
Hemoglobin: 12.4 g/dL (ref 12.0–15.0)
MCHC: 32.7 g/dL (ref 30.0–36.0)
MCV: 95.3 fl (ref 78.0–100.0)
Platelets: 261 10*3/uL (ref 150.0–400.0)
RBC: 3.99 Mil/uL (ref 3.87–5.11)
RDW: 16.1 % — ABNORMAL HIGH (ref 11.5–15.5)
WBC: 8.3 10*3/uL (ref 4.0–10.5)

## 2021-05-12 LAB — IBC + FERRITIN
Ferritin: 36.7 ng/mL (ref 10.0–291.0)
Iron: 56 ug/dL (ref 42–145)
Saturation Ratios: 18.7 % — ABNORMAL LOW (ref 20.0–50.0)
TIBC: 299.6 ug/dL (ref 250.0–450.0)
Transferrin: 214 mg/dL (ref 212.0–360.0)

## 2021-05-12 LAB — LIPID PANEL
Cholesterol: 156 mg/dL (ref 0–200)
HDL: 34.4 mg/dL — ABNORMAL LOW (ref >39.00)
NonHDL: 121.57
Total CHOL/HDL Ratio: 5
Triglycerides: 203 mg/dL — ABNORMAL HIGH (ref 0.0–149.0)
VLDL: 40.6 mg/dL — ABNORMAL HIGH (ref 0.0–40.0)

## 2021-05-12 LAB — COMPREHENSIVE METABOLIC PANEL
ALT: 8 U/L (ref 0–35)
AST: 11 U/L (ref 0–37)
Albumin: 3.6 g/dL (ref 3.5–5.2)
Alkaline Phosphatase: 95 U/L (ref 39–117)
BUN: 15 mg/dL (ref 6–23)
CO2: 28 mEq/L (ref 19–32)
Calcium: 9.3 mg/dL (ref 8.4–10.5)
Chloride: 100 mEq/L (ref 96–112)
Creatinine, Ser: 0.89 mg/dL (ref 0.40–1.20)
GFR: 63.47 mL/min (ref >60.00)
Glucose, Bld: 163 mg/dL — ABNORMAL HIGH (ref 70–99)
Potassium: 4.3 mEq/L (ref 3.5–5.1)
Sodium: 139 mEq/L (ref 135–145)
Total Bilirubin: 0.4 mg/dL (ref 0.2–1.2)
Total Protein: 6.5 g/dL (ref 6.0–8.3)

## 2021-05-12 LAB — VITAMIN B12: Vitamin B-12: >1550 pg/mL — ABNORMAL HIGH (ref 211–911)

## 2021-05-12 LAB — LDL CHOLESTEROL, DIRECT: Direct LDL: 95 mg/dL

## 2021-05-12 LAB — VITAMIN D 25 HYDROXY (VIT D DEFICIENCY, FRACTURES): VITD: 73.37 ng/mL (ref 30.00–100.00)

## 2021-05-12 LAB — HEMOGLOBIN A1C: Hgb A1c MFr Bld: 7 % — ABNORMAL HIGH (ref 4.6–6.5)

## 2021-05-12 LAB — TSH: TSH: 1.89 u[IU]/mL (ref 0.35–5.50)

## 2021-05-12 MED ORDER — ATORVASTATIN CALCIUM 10 MG PO TABS: 10.0000 mg | ORAL_TABLET | Freq: Every day | ORAL | 3 refills | Status: DC

## 2021-05-12 NOTE — Progress Notes (Signed)
Crystal Ashley is a 75 y.o. female who presents today for an office visit.  Assessment/Plan:  Chronic Problems Addressed Today: Permanent atrial fibrillation (HCC) Rate controlled with metoprolol and anticoagulated on eliquis.   Debility Working with home health.  Controlled type 2 diabetes mellitus with hyperglycemia (HCC) Check A1c.  On metformin 500 mg daily.  Tolerating well.  B12 deficiency Check B12.  She is on B12 supplementation.  Hyperlipidemia associated with type 2 diabetes mellitus (HCC) On atorvastatin 10 mg daily.  We will check lipids today.  Vitamin D deficiency Check vitamin D.  Hypothyroidism Check TSH.  She is on Synthroid 75 mcg daily.  Flu vaccine given today.     Subjective:  HPI: She has no acute complaints today. Please see A/p for status of chronic conditions. She is transferring care to [place] today.   She is compliant with all medication with no notable side effects. Her son states she is taking Lasix 1x a day.  Home sugar readings have been measuring around 170 on average. He home blood pressure readings have been in the 130s/70s.   ROS: Per HPI, otherwise a complete review of systems was negative.   PMH:  The following were reviewed and entered/updated in epic: Past Medical History:  Diagnosis Date   Atrial fibrillation ([place])    Cancer ([place])    Cataract    Diabetes mellitus without complication ([place])    Fracture 2001   left ankle   Glaucoma    Hypertension    Thyroid disease    Patient Active Problem List   Diagnosis Date Noted   Chronic obstructive pulmonary disease with (acute) exacerbation ([place]) 05/12/2021   Permanent atrial fibrillation ([place]) 11/19/2020   Heart failure, type unknown ([place]) 11/19/2020   Pulmonary edema 07/21/2020   Chronic blood loss anemia 07/21/2020   Controlled type 2 diabetes mellitus with hyperglycemia ([place]) 07/21/2020   Debility 07/21/2020   Fusion of spine, thoracolumbar region 02/26/2020    Controlled type 2 diabetes mellitus with hyperglycemia, without long-term current use of insulin ([place])    Fracture of vertebra due to osteoporosis ([place])    Multiple traumatic injuries 02/12/2020   Chronic combined systolic (congestive) and diastolic (congestive) heart failure ([place])    B12 deficiency 09/23/2018   Anemia 09/23/2018   History of cardioversion 04/26/2018   Hyperlipidemia associated with type 2 diabetes mellitus ([place]) 01/04/2017   Vitamin D deficiency 12/15/2016   Glaucoma    Hypertension associated with diabetes ([place]) 04/19/2014   Hypothyroidism 04/19/2014   History of endometrial cancer 03/30/2014   Past Surgical History:  Procedure Laterality Date   APPLICATION OF ROBOTIC ASSISTANCE FOR SPINAL PROCEDURE N/A 02/26/2020   Procedure: APPLICATION OF ROBOTIC ASSISTANCE FOR SPINAL PROCEDURE;  Surgeon: [name], MD;  Location: [facility];  Service: Neurosurgery;  Laterality: N/A;   CARDIOVERSION N/A 04/08/2018   Procedure: CARDIOVERSION;  Surgeon: Adrian Prows, MD;  Location: [facility] ENDOSCOPY;  Service: Cardiovascular;  Laterality: N/A;   CARDIOVERSION N/A 05/22/2019   Procedure: CARDIOVERSION;  Surgeon: Adrian Prows, MD;  Location: [facility];  Service: Cardiovascular;  Laterality: N/A;   FOOT SURGERY Left    HYSTEROSCOPY     POLYPECTOMY   LUMBAR PERCUTANEOUS PEDICLE SCREW 4 LEVEL N/A 02/26/2020   Procedure: Thoracic eight to thoracic twelve posterior percutaneous instrumentation with cement augmentation and bilateral medial facetectomy for decompression at Thoracic ten-eleven;  Surgeon: [name], MD;  Location: [facility];  Service: Neurosurgery;  Laterality: N/A;    Family History  Problem Relation Age of Onset   Diabetes Son    Healthy Mother    Healthy Father     Medications- reviewed and updated Current Outpatient Medications  Medication Sig Dispense Refill   Cholecalciferol (VITAMIN D3 GUMMIES ADULT PO) Take 2 tablets by mouth daily in the afternoon.      docusate sodium (COLACE) 100 MG capsule TAKE 1 CAPSULE TWICE DAILY 180 capsule 2   dorzolamide-timolol (COSOPT) 22.3-6.8 MG/ML ophthalmic solution Place 1 drop into both eyes daily.     ferrous sulfate 220 (44 Fe) MG/5ML solution Take 220 mg by mouth daily.     furosemide (LASIX) 40 MG tablet Take 1 tablet (40 mg total) by mouth daily. Change to every other day after 10 Lbs weight loss  08/06/2020 90 tablet 3   levothyroxine (SYNTHROID) 75 MCG tablet TAKE 1 TABLET EVERY DAY BEFORE BREAKFAST 90 tablet 0   Menthol, Topical Analgesic, (BIOFREEZE) 4 % GEL Apply 1 application topically daily as needed (pain). Apply to right side rib cage and lateral thigh     metFORMIN (GLUCOPHAGE) 500 MG tablet Take 1 tablet (500 mg total) by mouth daily with breakfast. 90 tablet 3   metoprolol succinate (TOPROL XL) 25 MG 24 hr tablet Take 1 tablet (25 mg total) by mouth daily. 90 tablet 3   Multiple Vitamins-Minerals (MULTI-VITAMIN GUMMIES PO) Take 2 tablets by mouth daily.     pantoprazole (PROTONIX) 40 MG tablet TAKE 1 TABLET EVERY DAY (PLEASE REFILL WITH PRIMARY CARE PROVIDER FOR ANYMORE REFILLS) 90 tablet 0   rivaroxaban (XARELTO) 20 MG TABS tablet Take 1 tablet (20 mg total) by mouth daily with supper. 90 tablet 1   spironolactone (ALDACTONE) 25 MG tablet Take 1 tablet (25 mg total) by mouth daily. 90 tablet 3   travoprost, benzalkonium, (TRAVATAN) 0.004 % ophthalmic solution Place 1 drop into both eyes at bedtime.      vitamin B-12 (CYANOCOBALAMIN) 500 MCG tablet Take 500 mcg by mouth daily.      atorvastatin (LIPITOR) 10 MG tablet Take 1 tablet (10 mg total) by mouth daily. 90 tablet 3   potassium chloride 20 MEQ/15ML (10%) SOLN Take 15 mLs (20 mEq total) by mouth daily. 1350 mL 2   No current facility-administered medications for this visit.    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   Only one child   Social Determinants of Radio broadcast assistant Strain: Not on file  Food Insecurity: Not on file  Transportation Needs: Not on file  Physical Activity: Not on file  Stress: Not on file  Social Connections: Not on file  Objective:  Physical Exam: BP (!) 136/98   Pulse 96   Temp 98.2 F (36.8 C) (Temporal)   Ht 5' (1.524 m)   Wt 173 lb (78.5 kg) Comment: pt recored on 04/08/2021  SpO2 97%   BMI 33.79 kg/m   Gen: No acute distress, resting comfortably CV: Irregular with no murmurs appreciated Pulm: Normal work of breathing, clear to auscultation bilaterally with no crackles, wheezes, or rhonchi Neuro: Grossly normal, moves all extremities Psych: Normal affect and thought content      I,Jordan Kelly,acting as a scribe for Dimas Chyle, MD.,have documented all relevant documentation on the behalf of Dimas Chyle, MD,as directed by  Dimas Chyle, MD while in the presence of Dimas Chyle, MD.  I, Dimas Chyle, MD, have reviewed all documentation for this visit. The documentation on 05/12/21 for the exam, diagnosis, procedures, and orders are all accurate and complete.  Time Spent: 45 minutes of total time was spent on the date of the encounter performing the following actions: chart review prior to seeing the patient including recent visits with previous PCP,  obtaining history, performing a medically necessary exam, counseling on the treatment plan, placing orders, and documenting in our EHR.     Algis Greenhouse. Jerline Pain, MD 05/12/2021 10:06 AM

## 2021-05-12 NOTE — Assessment & Plan Note (Signed)
Check TSH.  She is on Synthroid 75 mcg daily. 

## 2021-05-12 NOTE — Assessment & Plan Note (Signed)
On atorvastatin 10 mg daily.  We will check lipids today.

## 2021-05-12 NOTE — Assessment & Plan Note (Signed)
Check A1c.  On metformin 500 mg daily.  Tolerating well.

## 2021-05-12 NOTE — Patient Instructions (Signed)
It was very nice to see you today!  We will check blood work today.  We will get your flu shot.  Please continue to monitor your blood pressure at home and let me know if it is persistently 140/90 or higher.  I will see you back in 6 months.  Please come back to see me sooner if needed.  Take care, Dr Jerline Pain  PLEASE NOTE:  If you had any lab tests please let us know if you have not heard back within a few days. You may see your results on mychart before we have a chance to review them but we will give you a call once they are reviewed by [place]. If we ordered any referrals today, please let us know if you have not heard from their office within the next week.   Please try these tips to maintain a healthy lifestyle:  Eat at least 3 REAL meals and 1-2 snacks per day.  Aim for no more than 5 hours between eating.  If you eat breakfast, please do so within one hour of getting up.   Each meal should contain half fruits/vegetables, one quarter protein, and one quarter carbs (no bigger than a computer mouse)  Cut down on sweet beverages. This includes juice, soda, and sweet tea.   Drink at least 1 glass of water with each meal and aim for at least 8 glasses per day  Exercise at least 150 minutes every week.

## 2021-05-12 NOTE — Assessment & Plan Note (Signed)
Working with home health.

## 2021-05-12 NOTE — Assessment & Plan Note (Signed)
Rate controlled with metoprolol and anticoagulated on eliquis.

## 2021-05-12 NOTE — Addendum Note (Signed)
Addended by: Doran Clay A on: 05/12/2021 10:14 AM   Modules accepted: Orders

## 2021-05-12 NOTE — Assessment & Plan Note (Signed)
Check B12.  She is on B12 supplementation.

## 2021-05-12 NOTE — Telephone Encounter (Signed)
Patient was seen today.

## 2021-05-12 NOTE — Assessment & Plan Note (Signed)
Check vitamin D. 

## 2021-05-13 NOTE — Progress Notes (Signed)
Please inform patient of the following:  Her labs are all STABLE. Do not need to make any changes to her treatment plan at this time. We can recheck in a year.  Algis Greenhouse. Jerline Pain, MD 05/13/2021 8:11 AM

## 2021-05-14 DIAGNOSIS — I5042 Chronic combined systolic (congestive) and diastolic (congestive) heart failure: Secondary | ICD-10-CM | POA: Diagnosis not present

## 2021-05-14 DIAGNOSIS — I4821 Permanent atrial fibrillation: Secondary | ICD-10-CM | POA: Diagnosis not present

## 2021-05-14 DIAGNOSIS — E1169 Type 2 diabetes mellitus with other specified complication: Secondary | ICD-10-CM | POA: Diagnosis not present

## 2021-05-14 DIAGNOSIS — I110 Hypertensive heart disease with heart failure: Secondary | ICD-10-CM | POA: Diagnosis not present

## 2021-05-14 DIAGNOSIS — E785 Hyperlipidemia, unspecified: Secondary | ICD-10-CM | POA: Diagnosis not present

## 2021-05-14 DIAGNOSIS — E039 Hypothyroidism, unspecified: Secondary | ICD-10-CM | POA: Diagnosis not present

## 2021-05-15 ENCOUNTER — Other Ambulatory Visit: Payer: Medicare Other

## 2021-05-15 ENCOUNTER — Other Ambulatory Visit (INDEPENDENT_AMBULATORY_CARE_PROVIDER_SITE_OTHER): Payer: Medicare Other

## 2021-05-15 DIAGNOSIS — R5383 Other fatigue: Secondary | ICD-10-CM

## 2021-05-15 DIAGNOSIS — E1165 Type 2 diabetes mellitus with hyperglycemia: Secondary | ICD-10-CM

## 2021-05-15 DIAGNOSIS — I152 Hypertension secondary to endocrine disorders: Secondary | ICD-10-CM

## 2021-05-15 DIAGNOSIS — I5033 Acute on chronic diastolic (congestive) heart failure: Secondary | ICD-10-CM

## 2021-05-15 DIAGNOSIS — E1159 Type 2 diabetes mellitus with other circulatory complications: Secondary | ICD-10-CM

## 2021-05-15 LAB — URINALYSIS, ROUTINE W REFLEX MICROSCOPIC
Bilirubin Urine: NEGATIVE
Hgb urine dipstick: NEGATIVE
Ketones, ur: NEGATIVE
Leukocytes,Ua: NEGATIVE
Nitrite: NEGATIVE
RBC / HPF: NONE SEEN (ref 0–?)
Specific Gravity, Urine: 1.01 (ref 1.000–1.030)
Total Protein, Urine: NEGATIVE
Urine Glucose: NEGATIVE
Urobilinogen, UA: 0.2 (ref 0.0–1.0)
pH: 7.5 (ref 5.0–8.0)

## 2021-05-15 LAB — MICROALBUMIN / CREATININE URINE RATIO
Creatinine,U: 55.3 mg/dL
Microalb Creat Ratio: 1.3 mg/g (ref 0.0–30.0)
Microalb, Ur: <0.7 mg/dL (ref 0.0–1.9)

## 2021-05-15 NOTE — Telephone Encounter (Signed)
Spoke with patient son, give lab results per Dr Jerline Pain  Her labs STABLE. Do not need to make any changes to her treatment plan at this time. We can recheck in a year. Verbalized understanding

## 2021-05-16 LAB — URINE CULTURE
MICRO NUMBER:: 12440201
MICRO NUMBER:: 12440082
SPECIMEN QUALITY:: ADEQUATE
SPECIMEN QUALITY:: ADEQUATE

## 2021-05-19 NOTE — Progress Notes (Signed)
Please inform patient of the following:  Her urine culture is NEGATIVE. She does not have a UTI.  Algis Greenhouse. Jerline Pain, MD 05/19/2021 8:04 AM

## 2021-05-21 ENCOUNTER — Telehealth (INDEPENDENT_AMBULATORY_CARE_PROVIDER_SITE_OTHER): Payer: Medicare Other | Admitting: Physician Assistant

## 2021-05-21 ENCOUNTER — Encounter: Payer: Self-pay | Admitting: Physician Assistant

## 2021-05-21 VITALS — Ht 60.0 in | Wt 173.0 lb

## 2021-05-21 DIAGNOSIS — R051 Acute cough: Secondary | ICD-10-CM | POA: Diagnosis not present

## 2021-05-21 MED ORDER — BENZONATATE 100 MG PO CAPS: 100.0000 mg | ORAL_CAPSULE | Freq: Two times a day (BID) | ORAL | 0 refills | Status: DC | PRN

## 2021-05-21 MED ORDER — AZITHROMYCIN 250 MG PO TABS: ORAL_TABLET | ORAL | 0 refills | Status: DC

## 2021-05-21 NOTE — Progress Notes (Signed)
Virtual Visit via Video   I connected with Crystal Ashley on 05/21/21 at  1:30 PM EDT by a video enabled telemedicine application and verified that I am speaking with the correct person using two identifiers. Location patient: Home Location provider: [facility], Office Persons participating in the virtual visit: Marquitta Darneisha Windhorst, [name] PA-C, Anselmo Pickler, LPN   I discussed the limitations of evaluation and management by telemedicine and the availability of in person appointments. The patient expressed understanding and agreed to proceed.  I acted as a Education administrator for [employer], [employer], LPN   Subjective:   HPI:    Cough Pt c/o dry cough x 1 week. She was using Robitussin and switch to Nyquil last night and cough seems a little better. Cough is worse at night. Denies fever, chills, no headaches.  One of her household members recently had cough.  She has had multiple negative home COVID tests.  Denies significant change in weight gain, productive cough, lower extremity swelling, shortness of breath.  Wt Readings from Last 3 Encounters:  05/21/21 173 lb (78.5 kg)  05/12/21 173 lb (78.5 kg)  08/06/20 186 lb (84.4 kg)    ROS: See pertinent positives and negatives per HPI.  Patient Active Problem List   Diagnosis Date Noted   Chronic obstructive pulmonary disease with (acute) exacerbation ([place]) 05/12/2021   Permanent atrial fibrillation ([place]) 11/19/2020   Heart failure, type unknown ([place]) 11/19/2020   Pulmonary edema 07/21/2020   Chronic blood loss anemia 07/21/2020   Controlled type 2 diabetes mellitus with hyperglycemia ([place]) 07/21/2020   Debility 07/21/2020   Fusion of spine, thoracolumbar region 02/26/2020   Controlled type 2 diabetes mellitus with hyperglycemia, without long-term current use of insulin ([place])    Fracture of vertebra due to osteoporosis ([place])    Multiple traumatic injuries 02/12/2020   Chronic combined systolic (congestive)  and diastolic (congestive) heart failure ([place])    B12 deficiency 09/23/2018   Anemia 09/23/2018   History of cardioversion 04/26/2018   Hyperlipidemia associated with type 2 diabetes mellitus ([place]) 01/04/2017   Vitamin D deficiency 12/15/2016   Glaucoma    Hypertension associated with diabetes ([place]) 04/19/2014   Hypothyroidism 04/19/2014   History of endometrial cancer 03/30/2014    Social History   Tobacco Use   Smoking status: Never   Smokeless tobacco: Never  Substance Use Topics   Alcohol use: No    Current Outpatient Medications:    atorvastatin (LIPITOR) 10 MG tablet, Take 1 tablet (10 mg total) by mouth daily., Disp: 90 tablet, Rfl: 3   azithromycin (ZITHROMAX) 250 MG tablet, Take 2 tablets on day 1, then 1 tablet daily on days 2 through 5, Disp: 6 tablet, Rfl: 0   benzonatate (TESSALON) 100 MG capsule, Take 1 capsule (100 mg total) by mouth 2 (two) times daily as needed for cough., Disp: 20 capsule, Rfl: 0   Cholecalciferol (VITAMIN D3 GUMMIES ADULT PO), Take 2 tablets by mouth daily in the afternoon., Disp: , Rfl:    docusate sodium (COLACE) 100 MG capsule, TAKE 1 CAPSULE TWICE DAILY, Disp: 180 capsule, Rfl: 2   dorzolamide-timolol (COSOPT) 22.3-6.8 MG/ML ophthalmic solution, Place 1 drop into both eyes daily., Disp: , Rfl:    ferrous sulfate 220 (44 Fe) MG/5ML solution, Take 220 mg by mouth daily., Disp: , Rfl:    furosemide (LASIX) 40 MG tablet, Take 1 tablet (40 mg total) by mouth daily. Change to every other day after 10 Lbs weight  loss  08/06/2020, Disp: 90 tablet, Rfl: 3   levothyroxine (SYNTHROID) 75 MCG tablet, TAKE 1 TABLET EVERY DAY BEFORE BREAKFAST, Disp: 90 tablet, Rfl: 0   Menthol, Topical Analgesic, (BIOFREEZE) 4 % GEL, Apply 1 application topically daily as needed (pain). Apply to right side rib cage and lateral thigh, Disp: , Rfl:    metFORMIN (GLUCOPHAGE) 500 MG tablet, Take 1 tablet (500 mg total) by mouth daily with breakfast., Disp: 90 tablet, Rfl: 3    metoprolol succinate (TOPROL XL) 25 MG 24 hr tablet, Take 1 tablet (25 mg total) by mouth daily., Disp: 90 tablet, Rfl: 3   Multiple Vitamins-Minerals (MULTI-VITAMIN GUMMIES PO), Take 2 tablets by mouth daily., Disp: , Rfl:    pantoprazole (PROTONIX) 40 MG tablet, TAKE 1 TABLET EVERY DAY (PLEASE REFILL WITH PRIMARY CARE PROVIDER FOR ANYMORE REFILLS), Disp: 90 tablet, Rfl: 0   potassium chloride 20 MEQ/15ML (10%) SOLN, Take 15 mLs (20 mEq total) by mouth daily., Disp: 1350 mL, Rfl: 2   rivaroxaban (XARELTO) 20 MG TABS tablet, Take 1 tablet (20 mg total) by mouth daily with supper., Disp: 90 tablet, Rfl: 1   spironolactone (ALDACTONE) 25 MG tablet, Take 1 tablet (25 mg total) by mouth daily., Disp: 90 tablet, Rfl: 3   travoprost, benzalkonium, (TRAVATAN) 0.004 % ophthalmic solution, Place 1 drop into both eyes at bedtime. , Disp: , Rfl:    vitamin B-12 (CYANOCOBALAMIN) 500 MCG tablet, Take 500 mcg by mouth daily. 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   Amiodarone     Nausea    Objective:   VITALS: Per patient if applicable, see vitals. GENERAL: Alert, appears well and in no acute distress. HEENT: Atraumatic, conjunctiva clear, no obvious abnormalities on inspection of external nose and ears. NECK: Normal movements of the head and neck. CARDIOPULMONARY: No increased WOB. Speaking in clear sentences. I:E ratio WNL.  MS: Moves all visible extremities without noticeable abnormality. PSYCH: Pleasant and cooperative, well-groomed. Speech normal rate and rhythm. Affect is appropriate. Insight and judgement are appropriate. Attention is focused, linear, and appropriate.  NEURO: CN grossly intact. Oriented as arrived to appointment on time with no prompting. Moves both UE equally.  SKIN: No obvious lesions, wounds, erythema, or cyanosis noted on face or  hands.  Assessment and Plan:   Chrystine was seen today for cough.  Diagnoses and all orders for this visit:  Acute cough  Other orders -     azithromycin (ZITHROMAX) 250 MG tablet; Take 2 tablets on day 1, then 1 tablet daily on days 2 through 5 -     benzonatate (TESSALON) 100 MG capsule; Take 1 capsule (100 mg total) by mouth 2 (two) times daily as needed for cough.  Suspect possible early bacterial infection from household contact Start azithromycin antibiotic I have also sent in Winfred Worsening precautions advised in the interim Follow-up if any changes in symptoms or lack of improvement  I discussed the assessment and treatment plan with the patient. The patient was provided an opportunity to ask questions and all were answered. The patient agreed with the plan and demonstrated an understanding of the instructions.   The patient was advised to call back or seek an in-person evaluation if the symptoms worsen or if the condition fails to improve as anticipated.   CMA or LPN served as scribe during this visit. History,  Physical, and Plan performed by medical provider. The above documentation has been reviewed and is accurate and complete.   [name], [place] 05/21/2021

## 2021-05-22 DIAGNOSIS — E039 Hypothyroidism, unspecified: Secondary | ICD-10-CM | POA: Diagnosis not present

## 2021-05-22 DIAGNOSIS — I110 Hypertensive heart disease with heart failure: Secondary | ICD-10-CM | POA: Diagnosis not present

## 2021-05-22 DIAGNOSIS — I4821 Permanent atrial fibrillation: Secondary | ICD-10-CM | POA: Diagnosis not present

## 2021-05-22 DIAGNOSIS — E785 Hyperlipidemia, unspecified: Secondary | ICD-10-CM | POA: Diagnosis not present

## 2021-05-22 DIAGNOSIS — E1169 Type 2 diabetes mellitus with other specified complication: Secondary | ICD-10-CM | POA: Diagnosis not present

## 2021-05-22 DIAGNOSIS — I5042 Chronic combined systolic (congestive) and diastolic (congestive) heart failure: Secondary | ICD-10-CM | POA: Diagnosis not present

## 2021-05-22 NOTE — Progress Notes (Signed)
Chronic Care Management Pharmacy Note  05/26/2021 Name:  Leveta Wahab MRN:  [id number] DOB:  Feb 09, 1946  Summary: Initial PharmD visit - all meds reviewed.  Her cough is improving after completion of Zpak, still taking Tessalon.  No concerns with meds at this time - patient is doing well overall.  Recommendations/Changes made from today's visit: Monitor glucose occasionally Patient is due for diabetic foot exam  Plan: FU 6 months   Subjective: Crystal Ashley is an 75 y.o. year old female who is a primary patient of Vivi Barrack, MD.  The CCM team was consulted for assistance with disease management and care coordination needs.    Engaged with patient by telephone for initial visit in response to provider referral for pharmacy case management and/or care coordination services.   Consent to Services:  The patient was given the following information about Chronic Care Management services today, agreed to services, and gave verbal consent: 1. CCM service includes personalized support from designated clinical staff supervised by the primary care provider, including individualized plan of care and coordination with other care providers 2. 24/7 contact phone numbers for assistance for urgent and routine care needs. 3. Service will only be billed when office clinical staff spend 20 minutes or more in a month to coordinate care. 4. Only one practitioner may furnish and bill the service in a calendar month. 5.The patient may stop CCM services at any time (effective at the end of the month) by phone call to the office staff. 6. The patient will be responsible for cost sharing (co-pay) of up to 20% of the service fee (after annual deductible is met). Patient agreed to services and consent obtained.  Patient Care Team: Vivi Barrack, MD as PCP - General (Family Medicine) [name], MD as PCP - Cardiology (Cardiology) [name], MD as Consulting Physician  (Ophthalmology) Adrian Prows, MD as Consulting Physician (Cardiology) Edythe Clarity, [facility] as Pharmacist (Pharmacist)  Recent office visits:  02/03/2021 [name], [name], no further information available.   01/31/2021 [name], no further information available.   01/27/2021 [name], no further information available.   01/22/2021 [name], no further information available.   01/21/2021 [name], MD; no medication changes indicated.   01/20/2021 [name], no further information available.   01/14/2021 [name], no further information available.   01/07/2021 [name], no further information available.   01/06/2021 [name], no further information available.   01/03/2021 [name], no further information available.   01/01/2021 [name], no further information available.   12/30/2020 [name], no further information available.   12/25/2020 [name], no further information available.   12/24/2020 [name], no further information available.   12/23/2020 [name], no further information available.   12/18/2020 [name], no further information available.   12/17/2020 [name], no further information available.   12/16/2020 [name], no further information available.   12/11/2020 [name], no further information available.   12/10/2020 [name], no further information available.   Recent consult visits:  02/25/2021 OV (ophthalmology) [name], no further information available   11/19/2020 OV (cardiology) [name], MD; no medication changes indicated.     Hospital visits:  None in previous 6 months  Objective:  Lab Results  Component Value Date   CREATININE 0.89 05/12/2021   BUN 15 05/12/2021  GFR 63.47 05/12/2021   GFRNONAA >60  07/26/2020   GFRAA >60 02/28/2020   NA 139 05/12/2021   K 4.3 05/12/2021   CALCIUM 9.3 05/12/2021   CO2 28 05/12/2021   GLUCOSE 163 (H) 05/12/2021    Lab Results  Component Value Date/Time   HGBA1C 7.0 (H) 05/12/2021 09:59 AM   HGBA1C 6.8 (H) 01/21/2021 03:13 PM   HGBA1C 6.5 04/08/2015 12:00 AM   GFR 63.47 05/12/2021 09:59 AM   GFR 41.63 (L) 01/17/2019 03:28 PM   MICROALBUR <0.7 05/15/2021 10:53 AM   MICROALBUR <0.7 01/22/2021 11:31 AM    Last diabetic Eye exam:  Lab Results  Component Value Date/Time   HMDIABEYEEXA No Retinopathy 02/25/2021 12:00 AM    Last diabetic Foot exam: No results found for: HMDIABFOOTEX   Lab Results  Component Value Date   CHOL 156 05/12/2021   HDL 34.40 (L) 05/12/2021   LDLCALC 80 01/21/2021   LDLDIRECT 95.0 05/12/2021   TRIG 203.0 (H) 05/12/2021   CHOLHDL 5 05/12/2021    Hepatic Function Latest Ref Rng & Units 05/12/2021 11/19/2020 07/26/2020  Total Protein 6.0 - 8.3 g/dL 6.5 7.0 6.1(L)  Albumin 3.5 - 5.2 g/dL 3.6 4.0 2.5(L)  AST 0 - 37 U/L 11 18 13(L)  ALT 0 - 35 U/L '8 11 14  ' Alk Phosphatase 39 - 117 U/L 95 117 71  Total Bilirubin 0.2 - 1.2 mg/dL 0.4 0.4 0.7    Lab Results  Component Value Date/Time   TSH 1.89 05/12/2021 09:59 AM   TSH 2.86 01/21/2021 03:13 PM   FREET4 1.07 03/15/2018 01:17 PM    CBC Latest Ref Rng & Units 05/12/2021 01/21/2021 11/19/2020  WBC 4.0 - 10.5 K/uL 8.3 9.8 9.9  Hemoglobin 12.0 - 15.0 g/dL 12.4 13.2 13.0  Hematocrit 36.0 - 46.0 % 38.0 40.0 40.1  Platelets 150.0 - 400.0 K/uL 261.0 303.0 337    Lab Results  Component Value Date/Time   VD25OH 73.37 05/12/2021 09:59 AM   VD25OH 51.67 02/13/2020 05:48 AM   VD25OH 71.88 11/29/2019 09:44 AM    Clinical ASCVD: Yes  The 10-year ASCVD risk score (Arnett DK, et al., 2019) is: 39.4%   Values used to calculate the score:     Age: 75 years     Sex: Female     Is Non-Hispanic African American: No     Diabetic: Yes     Tobacco smoker: No     Systolic Blood  Pressure: 136 mmHg     Is BP treated: Yes     HDL Cholesterol: 34.4 mg/dL     Total Cholesterol: 156 mg/dL    Depression screen [facility] 2/9 05/12/2021 01/21/2021 11/29/2019  Decreased Interest 0 0 0  Down, Depressed, Hopeless 0 0 0  PHQ - 2 Score 0 0 0     Social History   Tobacco Use  Smoking Status Never  Smokeless Tobacco Never   BP Readings from Last 3 Encounters:  05/12/21 (!) 136/98  01/21/21 (!) 125/94  11/19/20 112/60   Pulse Readings from Last 3 Encounters:  05/12/21 96  01/21/21 88  11/19/20 (!) 110   Wt Readings from Last 3 Encounters:  05/21/21 173 lb (78.5 kg)  05/12/21 173 lb (78.5 kg)  08/06/20 186 lb (84.4 kg)   BMI Readings from Last 3 Encounters:  05/21/21 33.79 kg/m  05/12/21 33.79 kg/m  01/21/21 36.33 kg/m    Assessment/Interventions: Review of patient past medical history, allergies, medications, health status, including review of consultants reports,  laboratory and other test data, was performed as part of comprehensive evaluation and provision of chronic care management services.   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   Amiodarone     Nausea    Medications Reviewed Today     Reviewed by Edythe Clarity, [facility] (Pharmacist) on 05/26/21 at [phone]  Med List Status: <None>    Medication Order Taking? Sig Documenting Provider Last Dose Status Informant  atorvastatin (LIPITOR) 10 MG tablet [id number] Yes Take 1 tablet (10 mg total) by mouth daily. Vivi Barrack, MD Taking Active   benzonatate [facility]) 100 MG capsule [id number] Yes Take 1 capsule (100 mg total) by mouth 2 (two) times daily as needed for cough. [name], [place] Taking Active   Cholecalciferol (VITAMIN D3 GUMMIES ADULT PO) [id number] Yes Take 1 tablet by mouth daily in the afternoon. [provider] Taking Active   docusate sodium (COLACE) 100 MG capsule [id number] Yes TAKE 1 CAPSULE TWICE DAILY [name], [place] Taking Active   dorzolamide-timolol (COSOPT) 22.3-6.8 MG/ML ophthalmic solution [id number] Yes Place 1 drop into both eyes daily. [provider] Taking Active Child  ferrous sulfate 220 (44 Fe) MG/5ML solution [id number] Yes Take 220 mg by mouth daily. [provider] Taking Active   furosemide (LASIX) 40 MG tablet [id number] Yes Take 1 tablet (40 mg total) by mouth daily. Change to every other day after 10 Lbs weight loss  08/06/2020 [name], MD Taking Active   levothyroxine (SYNTHROID) 75 MCG tablet [id number] Yes TAKE 1 TABLET EVERY DAY BEFORE BREAKFAST Vivi Barrack, MD Taking Active   Menthol, Topical Analgesic, (BIOFREEZE) 4 % GEL [id number] Yes Apply 1 application topically daily as needed (pain). Apply to right side rib cage and lateral thigh [provider] Taking Active Child  metFORMIN (GLUCOPHAGE) 500 MG tablet [id number] Yes Take 1 tablet (500 mg total) by mouth daily with breakfast. [name], [place] Taking Active   metoprolol succinate (TOPROL XL) 25 MG 24 hr tablet [id number] Yes Take 1 tablet (25 mg total) by mouth daily. [name], MD Taking Active   Multiple Vitamins-Minerals (MULTI-VITAMIN GUMMIES PO) [id number] Yes Take 2 tablets by mouth daily. [provider] Taking Active   pantoprazole (PROTONIX)  40 MG tablet [id number] Yes TAKE 1 TABLET EVERY DAY (PLEASE REFILL WITH PRIMARY CARE PROVIDER FOR ANYMORE REFILLS) [name], [place] Taking Active   potassium chloride 20 MEQ/15ML (10%) SOLN [id number]  Take 15 mLs (20 mEq total) by mouth daily. [name], [place]  Expired 05/21/21 2359   rivaroxaban (XARELTO) 20 MG TABS tablet [id number] Yes Take 1 tablet (20 mg total) by mouth daily with supper. [name], MD Taking Active   spironolactone (ALDACTONE) 25 MG tablet [id number] Yes Take 1 tablet (25 mg  total) by mouth daily. [name], MD Taking Active   travoprost, benzalkonium, (TRAVATAN) 0.004 % ophthalmic solution [id number] Yes Place 1 drop into both eyes at bedtime.  [provider] Taking Active Child  vitamin B-12 (CYANOCOBALAMIN) 500 MCG tablet [id number] Yes Take 500 mcg by mouth daily.  [provider] Taking Active Child            Patient Active Problem List   Diagnosis Date Noted   Chronic obstructive pulmonary disease with (acute) exacerbation ([place]) 05/12/2021   Permanent atrial fibrillation ([place]) 11/19/2020   Heart failure, type unknown ([place]) 11/19/2020   Pulmonary edema 07/21/2020   Chronic blood loss anemia 07/21/2020   Controlled type 2 diabetes mellitus with hyperglycemia ([place]) 07/21/2020   Debility 07/21/2020   Fusion of spine, thoracolumbar region 02/26/2020   Controlled type 2 diabetes mellitus with hyperglycemia, without long-term current use of insulin ([place])    Fracture of vertebra due to osteoporosis ([place])    Multiple traumatic injuries 02/12/2020   Chronic combined systolic (congestive) and diastolic (congestive) heart failure ([place])    B12 deficiency 09/23/2018   Anemia 09/23/2018   History of cardioversion 04/26/2018   Hyperlipidemia associated with type 2 diabetes mellitus ([place]) 01/04/2017   Vitamin D deficiency 12/15/2016   Glaucoma    Hypertension associated with diabetes ([place]) 04/19/2014   Hypothyroidism  04/19/2014   History of endometrial cancer 03/30/2014    Immunization History  Administered Date(s) Administered   Fluad Quad(high Dose 65+) 05/25/2019, 05/28/2020, 05/12/2021   Influenza, High Dose Seasonal PF 06/07/2017, 06/14/2018   PFIZER(Purple Top)SARS-COV-2 Vaccination 10/16/2019, 11/14/2019, 06/06/2020    Conditions to be addressed/monitored:  HTN, HF, Afib, Type II DM, HLD, Hypothyroidism  Care Plan : General Pharmacy (Adult)  Updates made by Edythe Clarity, RPH since 05/26/2021 12:00 AM     Problem: HTN, HF, Afib, Type II DM, HLD, Hypothyroidism   Priority: High     Long-Range Goal: Patient-Specific Goal   Start Date: 05/26/2021  Expected End Date: 11/24/2021  This Visit's Progress: On track  Priority: High  Note:   Current Barriers:  None at this time  Pharmacist Clinical Goal(s):  Patient will achieve adherence to monitoring guidelines and medication adherence to achieve therapeutic efficacy maintain control of BP/glucose as evidenced by monitoring  through collaboration with PharmD and provider.   Interventions: 1:1 collaboration with Vivi Barrack, MD regarding development and update of comprehensive plan of care as evidenced by provider attestation and co-signature Inter-disciplinary care team collaboration (see longitudinal plan of care) Comprehensive medication review performed; medication list updated in electronic medical record  Hypertension (BP goal <140/90) -Uncontrolled -Current treatment: Metoprolol XL 65m daily Spironolactone 21mdaily -Medications previously tried: amlodipine, valsartan -Current home readings: none noted, mainly checks at MD office -Current dietary habits: mostly vegetarian -Current exercise habits: minimal -Denies hypotensive/hypertensive symptoms -Educated on BP goals and benefits of medications for prevention of heart attack, stroke and kidney damage; Daily salt intake goal < 2300 mg; Importance of home blood  pressure monitoring; Symptoms of hypotension and importance of maintaining adequate hydration; -Counseled to monitor BP at home periodically, document, and provide log at future appointments -Recommended to continue current medication  Hyperlipidemia: (LDL goal < 100) -Controlled -Current treatment: Atorvastatin 1041maily -Medications previously tried: none noted  -Current dietary patterns: vegetarian mainly -Current exercise habits: minimal currently -Educated on Cholesterol goals;  Benefits of statin for ASCVD risk reduction; Importance of limiting foods high in cholesterol; LDL most recently controlled, no issues  with medication side effects -Recommended to continue current medication  Diabetes (A1c goal <8%) -Controlled -Current medications: Metformin 564m daily with breakfast -Medications previously tried: Novolog  -Current home glucose readings - 173 most recently home reading, unsure if fasting or not -Denies hypoglycemic/hyperglycemic symptoms -Educated on A1c and blood sugar goals; Prevention and management of hypoglycemic episodes; Benefits of routine self-monitoring of blood sugar; -Counseled to check feet daily and get yearly eye exams -Recommended to continue current medication, monitor glucose occasionally, let providers know with readings < 70 of > 200.  Atrial Fibrillation (Goal: prevent stroke and major bleeding) -Controlled -Current treatment: Rate control: Metoprolol XL 269mdaily Anticoagulation: Xarelto 2027maily with supper -Medications previously tried: none noted -Home BP and HR readings: unknown  -Counseled on increased risk of stroke due to Afib and benefits of anticoagulation for stroke prevention; bleeding risk associated with Xarelto and importance of self-monitoring for signs/symptoms of bleeding; -Recommended to continue current medication Assessed patient finances. Copay is high for Xarelto currently, however with supplemental income she  would not qualify for patient assistance.  Heart Failure (Goal: manage symptoms and prevent exacerbations) -Controlled -Last ejection fraction: 60-65%  -Current treatment: Metoprolol XL 66m74mily Furosemide 40mg75mly -Medications previously tried: none noted  -Current home BP/HR readings: not checking -Current dietary habits: vegetarian -Current exercise habits: minimal -Educated on Benefits of medications for managing symptoms and prolonging life Importance of weighing daily; if you gain more than 3 pounds in one day or 5 pounds in one week, contact providers Proper diuretic administration and potassium supplementation Per son she is euvolemic at this time, monitoring weight at home - stays aroun 175 lbs.  She takes medication appropriately and denies any SOB> -Recommended to continue current medication  Hypothyroidism (Goal: Maintain TSH) -Controlled -Current treatment  Levothyroxine 75mcg63mly -Medications previously tried: none noted -TSH is WNL no changes to meds at this time  -Recommended to continue current medication  Patient Goals/Self-Care Activities Patient will:  - take medications as prescribed focus on medication adherence by pill box check glucose periodicallty, document, and provide at future appointments  Follow Up Plan: The care management team will reach out to the patient again over the next 180 days.        Medication Assistance: None required.  Patient affirms current coverage meets needs.  Compliance/Adherence/Medication fill history: Care Gaps: Due for foot exam  [facility]: Atorvastatin 10mg 019m/22 - has supply on hand  [facility] [place] [id number] [phone] box? Yes - son organizes for her Pt endorses 100% compliance  We discussed: Benefits of medication synchronization, packaging and delivery as well as enhanced pharmacist oversight with [employer]. Patient decided to: Continue current medication management strategy  Care Plan and Follow Up Patient Decision:  Patient agrees to Care Plan and Follow-up.  Plan: The care management team will reach out to the patient again over the next 180 days.  ChristiBeverly MilchD Clinical Pharmacist [phone]

## 2021-05-26 ENCOUNTER — Ambulatory Visit (INDEPENDENT_AMBULATORY_CARE_PROVIDER_SITE_OTHER): Payer: Medicare Other | Admitting: Pharmacist

## 2021-05-26 ENCOUNTER — Ambulatory Visit (INDEPENDENT_AMBULATORY_CARE_PROVIDER_SITE_OTHER): Payer: Medicare Other

## 2021-05-26 DIAGNOSIS — E785 Hyperlipidemia, unspecified: Secondary | ICD-10-CM

## 2021-05-26 DIAGNOSIS — I509 Heart failure, unspecified: Secondary | ICD-10-CM

## 2021-05-26 DIAGNOSIS — Z1211 Encounter for screening for malignant neoplasm of colon: Secondary | ICD-10-CM

## 2021-05-26 DIAGNOSIS — E1165 Type 2 diabetes mellitus with hyperglycemia: Secondary | ICD-10-CM

## 2021-05-26 DIAGNOSIS — Z0000 Encounter for general adult medical examination without abnormal findings: Secondary | ICD-10-CM

## 2021-05-26 DIAGNOSIS — I4821 Permanent atrial fibrillation: Secondary | ICD-10-CM

## 2021-05-26 DIAGNOSIS — E1169 Type 2 diabetes mellitus with other specified complication: Secondary | ICD-10-CM

## 2021-05-26 NOTE — Progress Notes (Signed)
Patient aware.

## 2021-05-26 NOTE — Addendum Note (Signed)
Addended by: Vivi Barrack on: 05/26/2021 02:35 PM   Modules accepted: Orders

## 2021-05-26 NOTE — Patient Instructions (Signed)
Crystal Ashley , Thank you for taking time to come for your Medicare Wellness Visit. I appreciate your ongoing commitment to your health goals. Please review the following plan we discussed and let me know if I can assist you in the future.   Screening recommendations/referrals: Colonoscopy: Cologuard if directed by PCP Recommended yearly ophthalmology/optometry visit for glaucoma screening and checkup Recommended yearly dental visit for hygiene and checkup  Vaccinations: Influenza vaccine: completed 05/12/21 repeat every year  Tdap vaccine: Due and discussed Shingles vaccine: Shingrix discussed. Please contact your pharmacy for coverage information.    Covid-19:Completed 3/1, 3/30, 06/06/20  Advanced directives: Copies in chart  Conditions/risks identified: get stronger to be able to stand alone   Next appointment: Follow up in one year for your annual wellness visit    Preventive Care 65 Years and Older, Female Preventive care refers to lifestyle choices and visits with your health care provider that can promote health and wellness. What does preventive care include? A yearly physical exam. This is also called an annual well check. Dental exams once or twice a year. Routine eye exams. Ask your health care provider how often you should have your eyes checked. Personal lifestyle choices, including: Daily care of your teeth and gums. Regular physical activity. Eating a healthy diet. Avoiding tobacco and drug use. Limiting alcohol use. Practicing safe sex. Taking low-dose aspirin every day. Taking vitamin and mineral supplements as recommended by your health care provider. What happens during an annual well check? The services and screenings done by your health care provider during your annual well check will depend on your age, overall health, lifestyle risk factors, and family history of disease. Counseling  Your health care provider may ask you questions about your: Alcohol  use. Tobacco use. Drug use. Emotional well-being. Home and relationship well-being. Sexual activity. Eating habits. History of falls. Memory and ability to understand (cognition). Work and work Statistician. Reproductive health. Screening  You may have the following tests or measurements: Height, weight, and BMI. Blood pressure. Lipid and cholesterol levels. These may be checked every 5 years, or more frequently if you are over 48 years old. Skin check. Lung cancer screening. You may have this screening every year starting at age 56 if you have a 30-pack-year history of smoking and currently smoke or have quit within the past 15 years. Fecal occult blood test (FOBT) of the stool. You may have this test every year starting at age 42. Flexible sigmoidoscopy or colonoscopy. You may have a sigmoidoscopy every 5 years or a colonoscopy every 10 years starting at age 70. Hepatitis C blood test. Hepatitis B blood test. Sexually transmitted disease (STD) testing. Diabetes screening. This is done by checking your blood sugar (glucose) after you have not eaten for a while (fasting). You may have this done every 1-3 years. Bone density scan. This is done to screen for osteoporosis. You may have this done starting at age 11. Mammogram. This may be done every 1-2 years. Talk to your health care provider about how often you should have regular mammograms. Talk with your health care provider about your test results, treatment options, and if necessary, the need for more tests. Vaccines  Your health care provider may recommend certain vaccines, such as: Influenza vaccine. This is recommended every year. Tetanus, diphtheria, and acellular pertussis (Tdap, Td) vaccine. You may need a Td booster every 10 years. Zoster vaccine. You may need this after age 51. Pneumococcal 13-valent conjugate (PCV13) vaccine. One dose is recommended after  age 8. Pneumococcal polysaccharide (PPSV23) vaccine. One dose is  recommended after age 29. Talk to your health care provider about which screenings and vaccines you need and how often you need them. This information is not intended to replace advice given to you by your health care provider. Make sure you discuss any questions you have with your health care provider. Document Released: 08/30/2015 Document Revised: 04/22/2016 Document Reviewed: 06/04/2015 Elsevier Interactive Patient Education  2017 [employer] Prevention in the Home Falls can cause injuries. They can happen to people of all ages. There are many things you can do to make your home safe and to help prevent falls. What can I do on the outside of my home? Regularly fix the edges of walkways and driveways and fix any cracks. Remove anything that might make you trip as you walk through a door, such as a raised step or threshold. Trim any bushes or trees on the path to your home. Use bright outdoor lighting. Clear any walking paths of anything that might make someone trip, such as rocks or tools. Regularly check to see if handrails are loose or broken. Make sure that both sides of any steps have handrails. Any raised decks and porches should have guardrails on the edges. Have any leaves, snow, or ice cleared regularly. Use sand or salt on walking paths during winter. Clean up any spills in your garage right away. This includes oil or grease spills. What can I do in the bathroom? Use night lights. Install grab bars by the toilet and in the tub and shower. Do not use towel bars as grab bars. Use non-skid mats or decals in the tub or shower. If you need to sit down in the shower, use a plastic, non-slip stool. Keep the floor dry. Clean up any water that spills on the floor as soon as it happens. Remove soap buildup in the tub or shower regularly. Attach bath mats securely with double-sided non-slip rug tape. Do not have throw rugs and other things on the floor that can make you  trip. What can I do in the bedroom? Use night lights. Make sure that you have a light by your bed that is easy to reach. Do not use any sheets or blankets that are too big for your bed. They should not hang down onto the floor. Have a firm chair that has side arms. You can use this for support while you get dressed. Do not have throw rugs and other things on the floor that can make you trip. What can I do in the kitchen? Clean up any spills right away. Avoid walking on wet floors. Keep items that you use a lot in easy-to-reach places. If you need to reach something above you, use a strong step stool that has a grab bar. Keep electrical cords out of the way. Do not use floor polish or wax that makes floors slippery. If you must use wax, use non-skid floor wax. Do not have throw rugs and other things on the floor that can make you trip. What can I do with my stairs? Do not leave any items on the stairs. Make sure that there are handrails on both sides of the stairs and use them. Fix handrails that are broken or loose. Make sure that handrails are as long as the stairways. Check any carpeting to make sure that it is firmly attached to the stairs. Fix any carpet that is loose or worn. Avoid having throw rugs  at the top or bottom of the stairs. If you do have throw rugs, attach them to the floor with carpet tape. Make sure that you have a light switch at the top of the stairs and the bottom of the stairs. If you do not have them, ask someone to add them for you. What else can I do to help prevent falls? Wear shoes that: Do not have high heels. Have rubber bottoms. Are comfortable and fit you well. Are closed at the toe. Do not wear sandals. If you use a stepladder: Make sure that it is fully opened. Do not climb a closed stepladder. Make sure that both sides of the stepladder are locked into place. Ask someone to hold it for you, if possible. Clearly mark and make sure that you can  see: Any grab bars or handrails. First and last steps. Where the edge of each step is. Use tools that help you move around (mobility aids) if they are needed. These include: Canes. Walkers. Scooters. Crutches. Turn on the lights when you go into a dark area. Replace any light bulbs as soon as they burn out. Set up your furniture so you have a clear path. Avoid moving your furniture around. If any of your floors are uneven, fix them. If there are any pets around you, be aware of where they are. Review your medicines with your doctor. Some medicines can make you feel dizzy. This can increase your chance of falling. Ask your doctor what other things that you can do to help prevent falls. This information is not intended to replace advice given to you by your health care provider. Make sure you discuss any questions you have with your health care provider. Document Released: 05/30/2009 Document Revised: 01/09/2016 Document Reviewed: 09/07/2014 Elsevier Interactive Patient Education  2017 [employer].

## 2021-05-26 NOTE — Progress Notes (Addendum)
Virtual Visit via Telephone Note  I connected with  Crystal Ashley on 05/26/21 at  1:45 PM EDT by telephone and verified that I am speaking with the correct person using two identifiers.  Medicare Annual Wellness visit completed telephonically due to Covid-19 pandemic.   Persons participating in this call: This Health Coach and this patient and Son Crystal Ashley  Location: Patient: Home Provider: Office   I discussed the limitations, risks, security and privacy concerns of performing an evaluation and management service by telephone and the availability of in person appointments. The patient expressed understanding and agreed to proceed.  Unable to perform video visit due to video visit attempted and failed and/or patient does not have video capability.   Some vital signs may be absent or patient reported.   Willette Brace, LPN   Subjective:   Crystal Ashley is a 75 y.o. female who presents for Medicare Annual (Subsequent) preventive examination.  Review of Systems     Cardiac Risk Factors include: advanced age (>72men, >48 women);sedentary lifestyle;obesity (BMI >30kg/m2);hypertension     Objective:    Today's Vitals   05/26/21 1352  PainSc: 4    There is no height or weight on file to calculate BMI.  Advanced Directives 05/26/2021 07/21/2020 07/21/2020 05/26/2020 05/26/2020 02/26/2020 02/12/2020  Does Patient Have a Medical Advance Directive? Yes No No Yes Yes No No  Type of Advance Directive Healthcare Power of [place]  Does patient want to make changes to medical advance directive? - - - No - Patient declined No - Guardian declined - -  Copy of [place] in Chart? Yes - validated most recent copy scanned in chart (See row information) - - - - - -  Would patient like information on creating a medical advance directive? - No - Patient declined - - - No - Patient declined No - Patient  declined    Current Medications (verified) Outpatient Encounter Medications as of 05/26/2021  Medication Sig   atorvastatin (LIPITOR) 10 MG tablet Take 1 tablet (10 mg total) by mouth daily.   benzonatate (TESSALON) 100 MG capsule Take 1 capsule (100 mg total) by mouth 2 (two) times daily as needed for cough.   Cholecalciferol (VITAMIN D3 GUMMIES ADULT PO) Take 1 tablet by mouth daily in the afternoon.   docusate sodium (COLACE) 100 MG capsule TAKE 1 CAPSULE TWICE DAILY   dorzolamide-timolol (COSOPT) 22.3-6.8 MG/ML ophthalmic solution Place 1 drop into both eyes daily.   ferrous sulfate 220 (44 Fe) MG/5ML solution Take 220 mg by mouth daily.   furosemide (LASIX) 40 MG tablet Take 1 tablet (40 mg total) by mouth daily. Change to every other day after 10 Lbs weight loss  08/06/2020   levothyroxine (SYNTHROID) 75 MCG tablet TAKE 1 TABLET EVERY DAY BEFORE BREAKFAST   Menthol, Topical Analgesic, (BIOFREEZE) 4 % GEL Apply 1 application topically daily as needed (pain). Apply to right side rib cage and lateral thigh   metFORMIN (GLUCOPHAGE) 500 MG tablet Take 1 tablet (500 mg total) by mouth daily with breakfast.   metoprolol succinate (TOPROL XL) 25 MG 24 hr tablet Take 1 tablet (25 mg total) by mouth daily.   Multiple Vitamins-Minerals (MULTI-VITAMIN GUMMIES PO) Take 2 tablets by mouth daily.   pantoprazole (PROTONIX) 40 MG tablet TAKE 1 TABLET EVERY DAY (PLEASE REFILL WITH PRIMARY CARE PROVIDER FOR ANYMORE REFILLS)   rivaroxaban (XARELTO) 20 MG TABS tablet Take  1 tablet (20 mg total) by mouth daily with supper.   spironolactone (ALDACTONE) 25 MG tablet Take 1 tablet (25 mg total) by mouth daily.   travoprost, benzalkonium, (TRAVATAN) 0.004 % ophthalmic solution Place 1 drop into both eyes at bedtime.    vitamin B-12 (CYANOCOBALAMIN) 500 MCG tablet Take 500 mcg by mouth daily.    potassium chloride 20 MEQ/15ML (10%) SOLN Take 15 mLs (20 mEq total) by mouth daily.   No facility-administered  encounter medications on file as of 05/26/2021.    Allergies (verified) Meat [alpha-gal] and Amiodarone   History: Past Medical History:  Diagnosis Date   Atrial fibrillation ([place])    Cancer ([place])    Cataract    Diabetes mellitus without complication ([place])    Fracture 2001   left ankle   Glaucoma    Hypertension    Thyroid disease    Past Surgical History:  Procedure Laterality Date   APPLICATION OF ROBOTIC ASSISTANCE FOR SPINAL PROCEDURE N/A 02/26/2020   Procedure: APPLICATION OF ROBOTIC ASSISTANCE FOR SPINAL PROCEDURE;  Surgeon: [name], MD;  Location: [place];  Service: Neurosurgery;  Laterality: N/A;   CARDIOVERSION N/A 04/08/2018   Procedure: CARDIOVERSION;  Surgeon: Adrian Prows, MD;  Location: [facility] ENDOSCOPY;  Service: Cardiovascular;  Laterality: N/A;   CARDIOVERSION N/A 05/22/2019   Procedure: CARDIOVERSION;  Surgeon: Adrian Prows, MD;  Location: [place] [facility];  Service: Cardiovascular;  Laterality: N/A;   FOOT SURGERY Left    HYSTEROSCOPY     POLYPECTOMY   LUMBAR PERCUTANEOUS PEDICLE SCREW 4 LEVEL N/A 02/26/2020   Procedure: Thoracic eight to thoracic twelve posterior percutaneous instrumentation with cement augmentation and bilateral medial facetectomy for decompression at Thoracic ten-eleven;  Surgeon: [name], MD;  Location: [place];  Service: Neurosurgery;  Laterality: N/A;   Family History  Problem Relation Age of Onset   Diabetes Son    Healthy Mother    Healthy Father    Social History   Socioeconomic History   Marital status: Widowed    Spouse name: Not on file   Number of children: 1   Years of education: Not on file   Highest education level: Not on file  Occupational History   Not on file  Tobacco Use   Smoking status: Never   Smokeless tobacco: Never  Vaping Use   Vaping Use: Never used  Substance and Sexual Activity   Alcohol use: No   Drug use: Never   Sexual activity: Yes  Other Topics Concern   Not on file  Social History  Narrative   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   Only one child   Social Determinants of Radio broadcast assistant Strain: Low Risk    Difficulty of Paying Living Expenses: Not hard at all  Food Insecurity: No Food Insecurity   Worried About Charity fundraiser in the Last Year: Never true   Arboriculturist in the Last Year: Never true  Transportation Needs: No Transportation Needs   Lack of Transportation (Medical): No   Lack of Transportation (Non-Medical): No  Physical Activity: Inactive   Days of Exercise per Week: 0 days   Minutes of Exercise per Session: 0 min  Stress: No Stress Concern Present   Feeling of Stress : Not at all  Social Connections: Socially Isolated   Frequency of Communication with Friends and Family: Never   Frequency of Social Gatherings with Friends and Family: Once a week   Attends Religious Services: Never  Active Member of Clubs or Organizations: No   Attends Archivist Meetings: Never   Marital Status: Widowed    Tobacco Counseling Counseling given: Not Answered   Clinical Intake:  Pre-visit preparation completed: Yes  Pain : No/denies pain Pain Score: 4  (right shoulder)     BMI - recorded: 33.79 Nutritional Status: BMI > 30  Obese Nutritional Risks: None Diabetes: No  How often do you need to have someone help you when you read instructions, pamphlets, or other written materials from your doctor or pharmacy?: 1 - Never  Diabetic?no  Interpreter Needed?: No  Information entered by :: Charlott Rakes, LPN   Activities of Daily Living In your present state of health, do you have any difficulty performing the following activities: 05/26/2021 07/21/2020  Hearing? Y [place]? N N  Difficulty concentrating or making decisions? Y Y  Comment with age -  Walking or climbing stairs? Y Y  Comment unable to stand unable to   Dressing or bathing? Tempie Donning  Comment family assist -  Doing errands, shopping? Tempie Donning  Comment - needs wheelchair Lucianne Lei for MD visits  Preparing Food and eating ? Y -  Comment family assist -  Using the Toilet? Y -  Comment incontinient -  In the past six months, have you accidently leaked urine? Y -  Comment incontinient -  Do you have problems with loss of bowel control? Y -  Comment incontinient -  Managing your Medications? Y -  Managing your Finances? Y -  Comment family assist -  Housekeeping or managing your Housekeeping? Y -  Some recent data might be hidden    Patient Care Team: Vivi Barrack, MD as PCP - General (Family Medicine) [name], MD as PCP - Cardiology (Cardiology) [name], MD as Consulting Physician (Ophthalmology) Adrian Prows, MD as Consulting Physician (Cardiology) Edythe Clarity, [facility] as Pharmacist (Pharmacist)  Indicate any recent Medical Services you may have received from other than [facility] providers in the past year (date may be approximate).     Assessment:   This is a routine wellness examination for Crystal Ashley.  Hearing/Vision screen Hearing Screening - Comments:: Pt HOH  Vision Screening - Comments:: Pt follows up with [facility] for annual eye exams   Dietary issues and exercise activities discussed: Current Exercise Habits: The patient does not participate in regular exercise at present   Goals Addressed             This Visit's Progress    Patient Stated       Getting her stronger to be able to stand alone        Depression Screen PHQ 2/9 Scores 05/26/2021 05/12/2021 01/21/2021 11/29/2019 06/14/2018 05/05/2017 12/15/2016  PHQ - 2 Score 0 0 0 0 0 0 0    Fall Risk Fall Risk  05/26/2021 01/21/2021 01/17/2019 06/14/2018 05/05/2017  Falls in the past year? 1 1 0 No No  Number falls in past yr: 1 0 0 - -  Injury with Fall? 1 1 0 - -  Comment right hip swelling - - - -  Risk for fall due to : Impaired balance/gait;Impaired mobility - - - -  Follow up Falls prevention discussed - - - -     FALL RISK PREVENTION PERTAINING TO THE HOME:  Any stairs in or around the home? No  If so, are there any without handrails? No  Home free of loose throw  rugs in walkways, pet beds, electrical cords, etc? Yes  Adequate lighting in your home to reduce risk of falls? Yes   ASSISTIVE DEVICES UTILIZED TO PREVENT FALLS:  Life alert? No  Use of a cane, walker or w/c? Yes   TIMED UP AND GO:  Was the test performed? No .   Cognitive Function:unable to perform testing        Immunizations Immunization History  Administered Date(s) Administered   Fluad Quad(high Dose 65+) 05/25/2019, 05/28/2020, 05/12/2021   Influenza, High Dose Seasonal PF 06/07/2017, 06/14/2018   PFIZER(Purple Top)SARS-COV-2 Vaccination 10/16/2019, 11/14/2019, 06/06/2020    TDAP status: Due, Education has been provided regarding the importance of this vaccine. Advised may receive this vaccine at local pharmacy or Health Dept. Aware to provide a copy of the vaccination record if obtained from local pharmacy or Health Dept. Verbalized acceptance and understanding.  Flu Vaccine status: Up to date    Covid-19 vaccine status: Completed vaccines  Qualifies for Shingles Vaccine? Yes   Zostavax completed No   Shingrix Completed?: No.    Education has been provided regarding the importance of this vaccine. Patient has been advised to call insurance company to determine out of pocket expense if they have not yet received this vaccine. Advised may also receive vaccine at local pharmacy or Health Dept. Verbalized acceptance and understanding.  Screening Tests Health Maintenance  Topic Date Due   TETANUS/TDAP  Never done   Zoster Vaccines- Shingrix (1 of 2) Never done   Fecal DNA (Cologuard)  Never done   FOOT EXAM  06/15/2019   COVID-19 Vaccine (4 - Booster for Pfizer series) 08/29/2020   HEMOGLOBIN A1C  11/09/2021   OPHTHALMOLOGY EXAM  02/25/2022   URINE MICROALBUMIN  05/15/2022   INFLUENZA VACCINE  Completed    Hepatitis C Screening  Completed   HPV VACCINES  Aged Out    Health Maintenance  Health Maintenance Due  Topic Date Due   TETANUS/TDAP  Never done   Zoster Vaccines- Shingrix (1 of 2) Never done   Fecal DNA (Cologuard)  Never done   FOOT EXAM  06/15/2019   COVID-19 Vaccine (4 - Booster for Pfizer series) 08/29/2020    Cologuard has never been completed pt son stated he would send in if needed please advise   Mammogram status: No longer required due to not a canidiate .     Additional Screening:  Hepatitis C Screening:  Completed 03/15/18  Vision Screening: Recommended annual ophthalmology exams for early detection of glaucoma and other disorders of the eye. Is the patient up to date with their annual eye exam?  Yes  Who is the provider or what is the name of the office in which the patient attends annual eye exams? [facility] opthalmology If pt is not established with a provider, would they like to be referred to a provider to establish care? No .   Dental Screening: Recommended annual dental exams for proper oral hygiene  Community Resource Referral / Chronic Care Management: CRR required this visit?  No   CCM required this visit?  No      Plan:     I have personally reviewed and noted the following in the patient's chart:   Medical and social history Use of alcohol, tobacco or illicit drugs  Current medications and supplements including opioid prescriptions.  Functional ability and status Nutritional status Physical activity Advanced directives List of other physicians Hospitalizations, surgeries, and ER visits in previous 12 months Vitals Screenings to include  cognitive, depression, and falls Referrals and appointments  In addition, I have reviewed and discussed with patient certain preventive protocols, quality metrics, and best practice recommendations. A written personalized care plan for preventive services as well as general preventive health  recommendations were provided to patient.     Willette Brace, LPN   [id number]   Nurse Notes: please advise if patient needs to have cologuard sent?

## 2021-05-26 NOTE — Patient Instructions (Addendum)
Visit Information   Goals Addressed             This Visit's Progress    Monitor and Manage My Blood Sugar-Diabetes Type 2       Timeframe:  Long-Range Goal Priority:  High Start Date: 05/26/21                            Expected End Date:  11/24/21                     Follow Up Date 08/26/21    - check blood sugar at prescribed times - check blood sugar if I feel it is too high or too low - enter blood sugar readings and medication or insulin into daily log    Why is this important?   Checking your blood sugar at home helps to keep it from getting very high or very low.  Writing the results in a diary or log helps the doctor know how to care for you.  Your blood sugar log should have the time, date and the results.  Also, write down the amount of insulin or other medicine that you take.  Other information, like what you ate, exercise done and how you were feeling, will also be helpful.     Notes:        Patient Care Plan: General Pharmacy (Adult)     Problem Identified: HTN, HF, Afib, Type II DM, HLD, Hypothyroidism   Priority: High     Long-Range Goal: Patient-Specific Goal   Start Date: 05/26/2021  Expected End Date: 11/24/2021  This Visit's Progress: On track  Priority: High  Note:   Current Barriers:  None at this time  Pharmacist Clinical Goal(s):  Patient will achieve adherence to monitoring guidelines and medication adherence to achieve therapeutic efficacy maintain control of BP/glucose as evidenced by monitoring  through collaboration with PharmD and provider.   Interventions: 1:1 collaboration with Vivi Barrack, MD regarding development and update of comprehensive plan of care as evidenced by provider attestation and co-signature Inter-disciplinary care team collaboration (see longitudinal plan of care) Comprehensive medication review performed; medication list updated in electronic medical record  Hypertension (BP goal  <140/90) -Uncontrolled -Current treatment: Metoprolol XL 25mg  daily Spironolactone 25mg  daily -Medications previously tried: amlodipine, valsartan -Current home readings: none noted, mainly checks at MD office -Current dietary habits: mostly vegetarian -Current exercise habits: minimal -Denies hypotensive/hypertensive symptoms -Educated on BP goals and benefits of medications for prevention of heart attack, stroke and kidney damage; Daily salt intake goal < 2300 mg; Importance of home blood pressure monitoring; Symptoms of hypotension and importance of maintaining adequate hydration; -Counseled to monitor BP at home periodically, document, and provide log at future appointments -Recommended to continue current medication  Hyperlipidemia: (LDL goal < 100) -Controlled -Current treatment: Atorvastatin 10mg  daily -Medications previously tried: none noted  -Current dietary patterns: vegetarian mainly -Current exercise habits: minimal currently -Educated on Cholesterol goals;  Benefits of statin for ASCVD risk reduction; Importance of limiting foods high in cholesterol; LDL most recently controlled, no issues with medication side effects -Recommended to continue current medication  Diabetes (A1c goal <8%) -Controlled -Current medications: Metformin 500mg  daily with breakfast -Medications previously tried: Novolog  -Current home glucose readings - 173 most recently home reading, unsure if fasting or not -Denies hypoglycemic/hyperglycemic symptoms -Educated on A1c and blood sugar goals; Prevention and management of hypoglycemic episodes; Benefits of routine self-monitoring of  blood sugar; -Counseled to check feet daily and get yearly eye exams -Recommended to continue current medication, monitor glucose occasionally, let providers know with readings < 70 of > 200.  Atrial Fibrillation (Goal: prevent stroke and major bleeding) -Controlled -Current treatment: Rate control:  Metoprolol XL 25mg  daily Anticoagulation: Xarelto 20mg  daily with supper -Medications previously tried: none noted -Home BP and HR readings: unknown  -Counseled on increased risk of stroke due to Afib and benefits of anticoagulation for stroke prevention; bleeding risk associated with Xarelto and importance of self-monitoring for signs/symptoms of bleeding; -Recommended to continue current medication Assessed patient finances. Copay is high for Xarelto currently, however with supplemental income she would not qualify for patient assistance.  Heart Failure (Goal: manage symptoms and prevent exacerbations) -Controlled -Last ejection fraction: 60-65%  -Current treatment: Metoprolol XL 25mg  daily Furosemide 40mg  daily -Medications previously tried: none noted  -Current home BP/HR readings: not checking -Current dietary habits: vegetarian -Current exercise habits: minimal -Educated on Benefits of medications for managing symptoms and prolonging life Importance of weighing daily; if you gain more than 3 pounds in one day or 5 pounds in one week, contact providers Proper diuretic administration and potassium supplementation Per son she is euvolemic at this time, monitoring weight at home - stays aroun 175 lbs.  She takes medication appropriately and denies any SOB> -Recommended to continue current medication  Hypothyroidism (Goal: Maintain TSH) -Controlled -Current treatment  Levothyroxine 69mcg daily -Medications previously tried: none noted -TSH is WNL no changes to meds at this time  -Recommended to continue current medication  Patient Goals/Self-Care Activities Patient will:  - take medications as prescribed focus on medication adherence by pill box check glucose periodicallty, document, and provide at future appointments  Follow Up Plan: The care management team will reach out to the patient again over the next 180 days.       Ms. Killman was given information about Chronic  Care Management services today including:  CCM service includes personalized support from designated clinical staff supervised by her physician, including individualized plan of care and coordination with other care providers 24/7 contact phone numbers for assistance for urgent and routine care needs. Standard insurance, coinsurance, copays and deductibles apply for chronic care management only during months in which we provide at least 20 minutes of these services. Most insurances cover these services at 100%, however patients may be responsible for any copay, coinsurance and/or deductible if applicable. This service may help you avoid the need for more expensive face-to-face services. Only one practitioner may furnish and bill the service in a calendar month. The patient may stop CCM services at any time (effective at the end of the month) by phone call to the office staff.  Patient agreed to services and verbal consent obtained.   The patient verbalized understanding of instructions, educational materials, and care plan provided today and agreed to receive a mailed copy of patient instructions, educational materials, and care plan.  Telephone follow up appointment with pharmacy team member scheduled for: 6 months  Edythe Clarity, [place]

## 2021-05-28 DIAGNOSIS — I4821 Permanent atrial fibrillation: Secondary | ICD-10-CM | POA: Diagnosis not present

## 2021-05-28 DIAGNOSIS — I110 Hypertensive heart disease with heart failure: Secondary | ICD-10-CM | POA: Diagnosis not present

## 2021-05-28 DIAGNOSIS — E039 Hypothyroidism, unspecified: Secondary | ICD-10-CM | POA: Diagnosis not present

## 2021-05-28 DIAGNOSIS — E785 Hyperlipidemia, unspecified: Secondary | ICD-10-CM | POA: Diagnosis not present

## 2021-05-28 DIAGNOSIS — I5042 Chronic combined systolic (congestive) and diastolic (congestive) heart failure: Secondary | ICD-10-CM | POA: Diagnosis not present

## 2021-05-28 DIAGNOSIS — E1169 Type 2 diabetes mellitus with other specified complication: Secondary | ICD-10-CM | POA: Diagnosis not present

## 2021-05-30 DIAGNOSIS — Z1211 Encounter for screening for malignant neoplasm of colon: Secondary | ICD-10-CM | POA: Diagnosis not present

## 2021-06-01 NOTE — Progress Notes (Signed)
Cardiology Office Note:    Date:  06/02/2021   ID:  Crystal Ashley, DOB [birth year], MRN [id number]  PCP:  Vivi Barrack, MD   [place]  Cardiologist:  [name], MD  Advanced Practice Provider:  No care team member to display Electrophysiologist:  None    CC: Follow up AF  History of Present Illness:    Crystal Ashley is a 75 y.o. 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 Seen 11/19/20 In interim of this visit, patient we had not made changes to her rate control strategy.  Hade cataract surgery with slight increase in heart rates post-operatively.  Seen 06/02/21.  Oncological History notable for: Malignancies: Urterine and endometrial cancer  Surgery: 2015 hysterectomy Chemotherapy: NA Cessations for Toxicity: NA Radiation: NA  Patient notes that she is doing great- they are not sure what they will do for Diwali, but may end up going to [place].    No chest pain or pressure .  No SOB/DOE and no PND/Orthopnea.  No weight gain or leg swelling- has been wearing compression stockings and son has noted a significant difference.  No blood in urine, stool or coughing up blood.  Had blood work 05/12/21:  K WNL, creatinine WNL, AST/ALT WNL, LDL at 95l Hgb 13-> 12.    Past Medical History:  Diagnosis Date   Atrial fibrillation ([place])    Cancer ([place])    Cataract    Diabetes mellitus without complication ([place])    Fracture 2001   left ankle   Glaucoma    Hypertension    Thyroid disease     Past Surgical History:  Procedure Laterality Date   APPLICATION OF ROBOTIC ASSISTANCE FOR SPINAL PROCEDURE N/A 02/26/2020   Procedure: APPLICATION OF ROBOTIC ASSISTANCE FOR SPINAL PROCEDURE;  Surgeon: [name], MD;  Location: [facility];  Service: Neurosurgery;  Laterality: N/A;   CARDIOVERSION N/A 04/08/2018   Procedure: CARDIOVERSION;  Surgeon: Adrian Prows, MD;  Location: [facility]  ENDOSCOPY;  Service: Cardiovascular;  Laterality: N/A;   CARDIOVERSION N/A 05/22/2019   Procedure: CARDIOVERSION;  Surgeon: Adrian Prows, MD;  Location: [facility];  Service: Cardiovascular;  Laterality: N/A;   FOOT SURGERY Left    HYSTEROSCOPY     POLYPECTOMY   LUMBAR PERCUTANEOUS PEDICLE SCREW 4 LEVEL N/A 02/26/2020   Procedure: Thoracic eight to thoracic twelve posterior percutaneous instrumentation with cement augmentation and bilateral medial facetectomy for decompression at Thoracic ten-eleven;  Surgeon: [name], MD;  Location: [facility];  Service: Neurosurgery;  Laterality: N/A;    Current Medications: Current Meds  Medication Sig   atorvastatin (LIPITOR) 10 MG tablet Take 1 tablet (10 mg total) by mouth daily.   benzonatate (TESSALON) 100 MG capsule Take 1 capsule (100 mg total) by mouth 2 (two) times daily as needed for cough.   Cholecalciferol (VITAMIN D3 GUMMIES ADULT PO) Take 1 tablet by mouth daily in the afternoon.   docusate sodium (COLACE) 100 MG capsule TAKE 1 CAPSULE TWICE DAILY   dorzolamide-timolol (COSOPT) 22.3-6.8 MG/ML ophthalmic solution Place 1 drop into both eyes daily.   ferrous sulfate 220 (44 Fe) MG/5ML solution Take 220 mg by mouth daily.   furosemide (LASIX) 40 MG tablet Take 1 tablet (40 mg total) by mouth daily. Change to every other day after 10 Lbs weight loss  08/06/2020   levothyroxine (SYNTHROID) 75 MCG tablet TAKE 1 TABLET EVERY DAY BEFORE BREAKFAST  Menthol, Topical Analgesic, (BIOFREEZE) 4 % GEL Apply 1 application topically daily as needed (pain). Apply to right side rib cage and lateral thigh   metFORMIN (GLUCOPHAGE) 500 MG tablet Take 1 tablet (500 mg total) by mouth daily with breakfast.   metoprolol succinate (TOPROL XL) 25 MG 24 hr tablet Take 1 tablet (25 mg total) by mouth daily.   Multiple Vitamins-Minerals (MULTI-VITAMIN GUMMIES PO) Take 2 tablets by mouth daily.   pantoprazole (PROTONIX) 40 MG tablet TAKE 1 TABLET EVERY DAY (PLEASE  REFILL WITH PRIMARY CARE PROVIDER FOR ANYMORE REFILLS)   potassium chloride 20 MEQ/15ML (10%) SOLN Take 15 mLs (20 mEq total) by mouth daily.   rivaroxaban (XARELTO) 20 MG TABS tablet Take 1 tablet (20 mg total) by mouth daily with supper.   spironolactone (ALDACTONE) 25 MG tablet Take 1 tablet (25 mg total) by mouth daily.   travoprost, benzalkonium, (TRAVATAN) 0.004 % ophthalmic solution Place 1 drop into both eyes at bedtime.    vitamin B-12 (CYANOCOBALAMIN) 500 MCG tablet Take 500 mcg by mouth daily.      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   Only one child   Social Determinants of Radio broadcast assistant Strain: Low Risk    Difficulty of Paying Living Expenses: Not hard at all  Food Insecurity: No Food Insecurity   Worried About Charity fundraiser in the Last Year: Never true   Arboriculturist in the Last Year: Never true  Transportation Needs: No Transportation Needs   Lack of Transportation (Medical): No   Lack of Transportation (Non-Medical): No  Physical Activity: Inactive   Days of Exercise per Week: 0 days   Minutes of Exercise per Session: 0 min  Stress: No Stress Concern Present   Feeling of Stress : Not at all  Social Connections: Socially Isolated   Frequency of Communication with Friends and Family: Never   Frequency of Social Gatherings with Friends and Family: Once a week   Attends Religious Services: Never   Building surveyor or Organizations: No   Attends Archivist Meetings: Never   Marital Status: Widowed    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  Loves old [place] movies  Family History: The patient's family history includes Diabetes in her son; Healthy in her father and mother.  ROS:   Please see the history of present illness.     All other systems reviewed and are negative.  EKGs/Labs/Other Studies Reviewed:    The following studies were reviewed today:  EKG:   06/02/21:  AF rate 104 low voltage limb lead 08/06/20: AF rate 95  Transthoracic Echocardiogram: Date:07/22/20 Results:  1. Left ventricular ejection fraction, by estimation, is 60 to 65%. The  left ventricle has normal function. Left ventricular endocardial border  not  optimally defined to evaluate regional wall motion. Left ventricular  diastolic function could not be  evaluated.   2. Right ventricular systolic function is normal. The right ventricular  size is normal. There is mildly elevated pulmonary artery systolic  pressure. The estimated right ventricular systolic pressure is 37.9 mmHg.   3. The mitral valve is grossly normal. No evidence of mitral valve  regurgitation. No evidence of mitral stenosis.   4. The aortic valve is grossly normal. Aortic valve regurgitation is not  visualized. No aortic stenosis is present.   5. The inferior vena cava is dilated in size with <50% respiratory  variability, suggesting right atrial pressure of 15 mmHg.   NonCardiac CT: Date:07/21/2020 Results: Right Atrial Enlargement Pulmonary Artery Dilation No significant CAC or Aortic Atherosclerosis  Recent Labs: 07/26/2020: B Natriuretic Peptide 215.3; Magnesium 1.9 11/19/2020: NT-Pro BNP 905 05/12/2021: ALT 8; BUN 15; Creatinine, Ser 0.89; Hemoglobin 12.4; Platelets 261.0; Potassium 4.3; Sodium 139; TSH 1.89  Recent Lipid Panel    Component Value Date/Time   CHOL 156 05/12/2021 0959    TRIG 203.0 (H) 05/12/2021 0959   HDL 34.40 (L) 05/12/2021 0959   CHOLHDL 5 05/12/2021 0959   VLDL 40.6 (H) 05/12/2021 0959   LDLCALC 80 01/21/2021 1513   LDLDIRECT 95.0 05/12/2021 0959    Risk Assessment/Calculations:    CHA2DS2-VASc Score = 3  This indicates a 3.2% annual risk of stroke. The patient's score is based upon: CHF History: 0 HTN History: 1 Diabetes History: 0 Stroke History: 0 Vascular Disease History: 0 Age Score: 1 Gender Score: 1      Physical Exam:    VS:  Pulse (!) 104   Ht 5' (1.524 m)   Wt 171 lb (77.6 kg)   SpO2 97%   BMI 33.40 kg/m     Blood pressure 122/70 (left arm manual)  Wt Readings from Last 3 Encounters:  06/02/21 171 lb (77.6 kg)  05/21/21 173 lb (78.5 kg)  05/12/21 173 lb (78.5 kg)    GEN:  Well nourished, well developed in no acute distress HEENT: Normal NECK: No JVD LYMPHATICS: No lymphadenopathy CARDIAC: irregularly irregular tachycardia, no murmurs, rubs, gallops RESPIRATORY:  Clear to auscultation without rales, wheezing or rhonchi  ABDOMEN: Soft, non-tender, non-distended MUSCULOSKELETAL:  Non pitting edema; No deformity  SKIN: Warm and dry NEUROLOGIC:  Alert and oriented x 3 PSYCHIATRIC:  Normal affect   ASSESSMENT:    1. Permanent atrial fibrillation ([place])   2. Hyperlipidemia associated with type 2 diabetes mellitus ([place])   3. Hypertension associated with diabetes ([place])     PLAN:    In order of problems listed above:  Permanent Atrial Fibrillation, Mild Dilation PA HLD with DM, HTN with DM - CHADSVASC=3. - continue DOAC- given change in Hgb will check follow up CBC - continue lasix 40 mg PO daily has not need additional lasix in interim - continue compression stockings use (off for today's exam) - continue metoprolol 25 mg PO daily ambulatory BP 120/80; heart rates 80 at home (log reviewed) - continue spironolactone  Six months follow up unless new symptoms or abnormal test results warranting change in  plan  Would be reasonable for  APP Follow up        Medication Adjustments/Labs and Tests Ordered: Current medicines are reviewed at length with the patient today.  Concerns regarding medicines are outlined above.  Orders Placed This Encounter  Procedures   CBC   EKG 12-Lead    No orders of the  defined types were placed in this encounter.   Patient Instructions  Medication Instructions:  Your physician recommends that you continue on your current medications as directed. Please refer to the Current Medication list given to you today.  *If you need a refill on your cardiac medications before your next appointment, please call your pharmacy*   Lab Work: TODAY: CBC If you have labs (blood work) drawn today and your tests are completely normal, you will receive your results only by: [place] (if you have MyChart) OR A paper copy in the mail If you have any lab test that is abnormal or we need to change your treatment, we will call you to review the results.   Testing/Procedures: NONE   Follow-Up: At [facility], you and your health needs are our priority.  As part of our continuing mission to provide you with exceptional heart care, we have created designated Provider Care Teams.  These Care Teams include your primary Cardiologist (physician) and Advanced Practice Providers (APPs -  Physician Assistants and Nurse Practitioners) who all work together to provide you with the care you need, when you need it.  We recommend signing up for the patient portal called "MyChart".  Sign up information is provided on this After Visit Summary.  MyChart is used to connect with patients for Virtual Visits (Telemedicine).  Patients are able to view lab/test results, encounter notes, upcoming appointments, etc.  Non-urgent messages can be sent to your provider as well.   To learn more about what you can do with MyChart, go to [email or web address].    Your next appointment:   6  month(s)  The format for your next appointment:   In Person  Provider:   You may see [name], MD or one of the following Advanced Practice Providers on your designated Care Team:   Melina Copa, PA-C Ermalinda Barrios, PA-C    Signed, [name], MD  06/02/2021 10:15 AM    [place]

## 2021-06-02 ENCOUNTER — Telehealth: Payer: Self-pay | Admitting: Pharmacist

## 2021-06-02 ENCOUNTER — Encounter: Payer: Self-pay | Admitting: Internal Medicine

## 2021-06-02 ENCOUNTER — Other Ambulatory Visit: Payer: Self-pay

## 2021-06-02 ENCOUNTER — Ambulatory Visit (INDEPENDENT_AMBULATORY_CARE_PROVIDER_SITE_OTHER): Payer: Medicare Other | Admitting: Internal Medicine

## 2021-06-02 VITALS — BP 122/70 | HR 104 | Ht 60.0 in | Wt 171.0 lb

## 2021-06-02 DIAGNOSIS — D500 Iron deficiency anemia secondary to blood loss (chronic): Secondary | ICD-10-CM | POA: Diagnosis not present

## 2021-06-02 DIAGNOSIS — I152 Hypertension secondary to endocrine disorders: Secondary | ICD-10-CM | POA: Diagnosis not present

## 2021-06-02 DIAGNOSIS — E785 Hyperlipidemia, unspecified: Secondary | ICD-10-CM

## 2021-06-02 DIAGNOSIS — I4821 Permanent atrial fibrillation: Secondary | ICD-10-CM

## 2021-06-02 DIAGNOSIS — J441 Chronic obstructive pulmonary disease with (acute) exacerbation: Secondary | ICD-10-CM

## 2021-06-02 DIAGNOSIS — E1169 Type 2 diabetes mellitus with other specified complication: Secondary | ICD-10-CM

## 2021-06-02 DIAGNOSIS — E1159 Type 2 diabetes mellitus with other circulatory complications: Secondary | ICD-10-CM | POA: Diagnosis not present

## 2021-06-02 LAB — CBC
Hematocrit: 38.1 % (ref 34.0–46.6)
Hemoglobin: 12.8 g/dL (ref 11.1–15.9)
MCH: 31.2 pg (ref 26.6–33.0)
MCHC: 33.6 g/dL (ref 31.5–35.7)
MCV: 93 fL (ref 79–97)
Platelets: 299 x10E3/uL (ref 150–450)
RBC: 4.1 x10E6/uL (ref 3.77–5.28)
RDW: 13.9 % (ref 11.7–15.4)
WBC: 9.6 x10E3/uL (ref 3.4–10.8)

## 2021-06-02 NOTE — Progress Notes (Signed)
    Chronic Care Management Pharmacy Assistant   Name: Crystal Ashley  MRN: [id number] DOB: 1946/01/18  Reason for Encounter: CCM Care Plan  Medications: Outpatient Encounter Medications as of 06/02/2021  Medication Sig   atorvastatin (LIPITOR) 10 MG tablet Take 1 tablet (10 mg total) by mouth daily.   benzonatate (TESSALON) 100 MG capsule Take 1 capsule (100 mg total) by mouth 2 (two) times daily as needed for cough.   Cholecalciferol (VITAMIN D3 GUMMIES ADULT PO) Take 1 tablet by mouth daily in the afternoon.   docusate sodium (COLACE) 100 MG capsule TAKE 1 CAPSULE TWICE DAILY   dorzolamide-timolol (COSOPT) 22.3-6.8 MG/ML ophthalmic solution Place 1 drop into both eyes daily.   ferrous sulfate 220 (44 Fe) MG/5ML solution Take 220 mg by mouth daily.   furosemide (LASIX) 40 MG tablet Take 1 tablet (40 mg total) by mouth daily. Change to every other day after 10 Lbs weight loss  08/06/2020   levothyroxine (SYNTHROID) 75 MCG tablet TAKE 1 TABLET EVERY DAY BEFORE BREAKFAST   Menthol, Topical Analgesic, (BIOFREEZE) 4 % GEL Apply 1 application topically daily as needed (pain). Apply to right side rib cage and lateral thigh   metFORMIN (GLUCOPHAGE) 500 MG tablet Take 1 tablet (500 mg total) by mouth daily with breakfast.   metoprolol succinate (TOPROL XL) 25 MG 24 hr tablet Take 1 tablet (25 mg total) by mouth daily.   Multiple Vitamins-Minerals (MULTI-VITAMIN GUMMIES PO) Take 2 tablets by mouth daily.   pantoprazole (PROTONIX) 40 MG tablet TAKE 1 TABLET EVERY DAY (PLEASE REFILL WITH PRIMARY CARE PROVIDER FOR ANYMORE REFILLS)   potassium chloride 20 MEQ/15ML (10%) SOLN Take 15 mLs (20 mEq total) by mouth daily.   rivaroxaban (XARELTO) 20 MG TABS tablet Take 1 tablet (20 mg total) by mouth daily with supper.   spironolactone (ALDACTONE) 25 MG tablet Take 1 tablet (25 mg total) by mouth daily.   travoprost, benzalkonium, (TRAVATAN) 0.004 % ophthalmic solution Place 1 drop into both eyes at  bedtime.    vitamin B-12 (CYANOCOBALAMIN) 500 MCG tablet Take 500 mcg by mouth daily.    No facility-administered encounter medications on file as of 06/02/2021.   Reviewed the patients initial visit reinsured it was completed per the pharmacist Leata Mouse request. Printed the CCM care plan. Mailed the patient CCM care plan to their most recent address on file.  Follow-Up:Pharmacist Review  Charlann Lange, [place] Pharmacist Assistant [phone]

## 2021-06-02 NOTE — Patient Instructions (Signed)
Medication Instructions:  Your physician recommends that you continue on your current medications as directed. Please refer to the Current Medication list given to you today.  *If you need a refill on your cardiac medications before your next appointment, please call your pharmacy*   Lab Work: TODAY: CBC If you have labs (blood work) drawn today and your tests are completely normal, you will receive your results only by: [place] (if you have MyChart) OR A paper copy in the mail If you have any lab test that is abnormal or we need to change your treatment, we will call you to review the results.   Testing/Procedures: NONE   Follow-Up: At [facility], you and your health needs are our priority.  As part of our continuing mission to provide you with exceptional heart care, we have created designated Provider Care Teams.  These Care Teams include your primary Cardiologist (physician) and Advanced Practice Providers (APPs -  Physician Assistants and Nurse Practitioners) who all work together to provide you with the care you need, when you need it.  We recommend signing up for the patient portal called "MyChart".  Sign up information is provided on this After Visit Summary.  MyChart is used to connect with patients for Virtual Visits (Telemedicine).  Patients are able to view lab/test results, encounter notes, upcoming appointments, etc.  Non-urgent messages can be sent to your provider as well.   To learn more about what you can do with MyChart, go to [email or web address].    Your next appointment:   6 month(s)  The format for your next appointment:   In Person  Provider:   You may see [name], MD or one of the following Advanced Practice Providers on your designated Care Team:   Melina Copa, PA-C Ermalinda Barrios, PA-C

## 2021-06-04 ENCOUNTER — Other Ambulatory Visit: Payer: Self-pay

## 2021-06-04 DIAGNOSIS — R195 Other fecal abnormalities: Secondary | ICD-10-CM

## 2021-06-04 LAB — COLOGUARD: Cologuard: POSITIVE — AB

## 2021-06-04 NOTE — Progress Notes (Signed)
Please inform patient of the following:  Cologuard is positive. She needs GI referral.  Crystal Ashley. Jerline Pain, MD 06/04/2021 7:44 AM

## 2021-06-10 ENCOUNTER — Encounter: Payer: Self-pay | Admitting: Family Medicine

## 2021-06-11 ENCOUNTER — Telehealth: Payer: Self-pay

## 2021-06-11 NOTE — Telephone Encounter (Signed)
Patient Name: Crystal Ashley [facility] Gender: Female DOB: 10-02-1945 Age: 75 Y 51 M 18 D Return Phone Number: [phone] (Primary) Address: City/ State/ Zip: [place]  [address] Client [place] [facility] [place] [facility] [place] [facility] [place] Day Physician Dimas Chyle- MD Contact Type Call Who Is Calling Patient / Member / Family / Caregiver Call Type Triage / Clinical Caller Name Shandricka Monroy Relationship To Patient Son Return Phone Number [phone] (Primary) Chief Complaint Weight Loss Reason for Call Symptomatic / Request for [place] states his mother has been having diarrhea and lost 3 pounds. She hasn't been eating either. Translation No Nurse Assessment Nurse: Leilani Merl, RN, Heather Date/Time (Eastern Time): 06/11/2021 12:45:22 PM Confirm and document reason for call. If symptomatic, describe symptoms. ---Caller states his mother has been having diarrhea and lost 3 pounds. She is not eating as much, but she is drinking water. The diarrhea started about 4 days ago Does the patient have any new or worsening symptoms? ---Yes Will a triage be completed? ---Yes Related visit to physician within the last 2 weeks? ---No Does the PT have any chronic conditions? (i.e. diabetes, asthma, this includes High risk factors for pregnancy, etc.) ---No Is this a behavioral health or substance abuse call? ---No Guidelines Guideline Title Affirmed Question Affirmed Notes Nurse Date/Time (Eastern Time) Diarrhea MILD-MODERATE diarrhea (e.g., 1-6 times / day more than normal) Standifer, RN, Heather 06/11/2021 12:46:14 PM Disp. Time Eilene Ghazi Time) Disposition Final User 06/11/2021 12:52:14 PM SEE PCP WITHIN 3 DAYS Yes Standifer, RN, Heather PLEASE NOTE: All timestamps contained within this report are represented as [place] Standard Time. CONFIDENTIALTY NOTICE: This fax transmission is intended  only for the addressee. It contains information that is legally privileged, confidential or otherwise protected from use or disclosure. If you are not the intended recipient, you are strictly prohibited from reviewing, disclosing, copying using or disseminating any of this information or taking any action in reliance on or regarding this information. If you have received this fax in error, please notify us immediately by telephone so that we can arrange for its return to [place]. Phone: [phone], Toll-Free: [phone], Fax: [phone] Page: 2 of 2 Call Id: [id number] Disposition Overriden: Home Care Override Reason: Patient's symptoms need a higher level of care Caller Disagree/Comply Comply Caller Understands Yes PreDisposition Call Doctor Care Advice Given Per Guideline SEE PCP WITHIN 3 DAYS: * You need to be seen within 2 or 3 days. CALL BACK IF: * Signs of dehydration occur (e.g., no urine over 12 hours, very dry mouth, lightheaded, etc.) * You become worse * Constant or severe abdomen pain CARE ADVICE given per Diarrhea (Adult) guideline. Comments User: Ave Filter, RN Date/Time Eilene Ghazi Time): 06/11/2021 12:47:33 PM Medical history: DM User: Ave Filter, RN Date/Time Eilene Ghazi Time): 06/11/2021 12:55:47 PM Caller states that his mother is not able to get in the car or walk much now. He would like someone from the office to call him about getting home health. Caller also stated that his cousin, who is a physician told them to stop giving patient her metformin and iron, due to the diarrhea, just thought you would want to know. Thanks

## 2021-06-11 NOTE — Telephone Encounter (Signed)
Spoke with patient son, need OV for Home health evaluation  And medication management

## 2021-06-12 ENCOUNTER — Encounter: Payer: Self-pay | Admitting: Family Medicine

## 2021-06-12 NOTE — Telephone Encounter (Signed)
Patient has F/U appointment tomorrow 06/13/2021 with PCP

## 2021-06-13 ENCOUNTER — Ambulatory Visit (INDEPENDENT_AMBULATORY_CARE_PROVIDER_SITE_OTHER): Payer: Medicare Other | Admitting: Family Medicine

## 2021-06-13 ENCOUNTER — Other Ambulatory Visit: Payer: Self-pay

## 2021-06-13 VITALS — BP 120/84 | HR 105 | Temp 97.7°F | Ht 60.0 in

## 2021-06-13 DIAGNOSIS — E1165 Type 2 diabetes mellitus with hyperglycemia: Secondary | ICD-10-CM | POA: Diagnosis not present

## 2021-06-13 DIAGNOSIS — R197 Diarrhea, unspecified: Secondary | ICD-10-CM | POA: Diagnosis not present

## 2021-06-13 DIAGNOSIS — E039 Hypothyroidism, unspecified: Secondary | ICD-10-CM

## 2021-06-13 DIAGNOSIS — R630 Anorexia: Secondary | ICD-10-CM

## 2021-06-13 DIAGNOSIS — R1013 Epigastric pain: Secondary | ICD-10-CM | POA: Diagnosis not present

## 2021-06-13 NOTE — Assessment & Plan Note (Signed)
Well-controlled.  It is okay for her to resume metformin 500 mg daily as she did not notice much of a difference in her diarrhea off of this.

## 2021-06-13 NOTE — Progress Notes (Signed)
   Crystal Ashley is a 75 y.o. female who presents today for an office visit.  Assessment/Plan:  New/Acute Problems: Diarrhea We will check labs today.  Vital signs are stable and exam is reassuring.  Recommend that she stop taking MiraLAX until her diarrhea resolves.  She can use Imodium as needed.  She is already stopped metformin however has not noticed much of a difference.  She is having some issues with decreased appetite/nausea and epigastric pain.  We will check right quadrant ultrasound per request though most of her symptoms are probably due to the MiraLAX.  She already has referral to GI placed for positive cologuard.  Will need colonoscopy.  Chronic Problems Addressed Today: Controlled type 2 diabetes mellitus with hyperglycemia ([place]) Well-controlled.  It is okay for her to resume metformin 500 mg daily as she did not notice much of a difference in her diarrhea off of this.  Hypothyroidism Check TSH.  On Synthroid 75 mcg daily.     Subjective:  HPI:  See A/P for status of chronic conditions.  Patient is here for follow-up.  Saw her about a month ago.  About a week ago she started developing persistent diarrhea and decreased appetite.  She had pretty significant weight loss with this as well.  There was concern that this may be due to her metformin and this was stopped.  Diarrhea has persisted.  She is taking docusate and MiraLAX.  Occasional epigastric pain. No vomiting. They would like to have labs done today.  Has had a positive Cologuard a couple days ago.  Has been referred to GI and has not heard anything back yet.       Objective:  Physical Exam: BP 120/84   Pulse (!) 105   Temp 97.7 F (36.5 C) (Temporal)   Ht 5' (1.524 m)   SpO2 98%   BMI 33.40 kg/m   Gen: No acute distress, resting comfortably CV: Regular rate and rhythm with no murmurs appreciated Pulm: Normal work of breathing, clear to auscultation bilaterally with no crackles, wheezes, or  rhonchi Neuro: Grossly normal, moves all extremities Psych: Normal affect and thought content      Espn Zeman M. Jerline Pain, MD 06/13/2021 3:58 PM

## 2021-06-13 NOTE — Patient Instructions (Signed)
It was very nice to see you today!  We will check blood work today.  Please stop the MiraLAX.  Please call the gastroenterologist if you have not heard anything by next week.  We will get an ultrasound as well.  I will see back in a couple of months for yourollow-up visit.  Please come back sooner if needed.  Take care, Dr Jerline Pain  PLEASE NOTE:  If you had any lab tests please let us know if you have not heard back within a few days. You may see your results on mychart before we have a chance to review them but we will give you a call once they are reviewed by [place]. If we ordered any referrals today, please let us know if you have not heard from their office within the next week.   Please try these tips to maintain a healthy lifestyle:  Eat at least 3 REAL meals and 1-2 snacks per day.  Aim for no more than 5 hours between eating.  If you eat breakfast, please do so within one hour of getting up.   Each meal should contain half fruits/vegetables, one quarter protein, and one quarter carbs (no bigger than a computer mouse)  Cut down on sweet beverages. This includes juice, soda, and sweet tea.   Drink at least 1 glass of water with each meal and aim for at least 8 glasses per day  Exercise at least 150 minutes every week.

## 2021-06-13 NOTE — Assessment & Plan Note (Signed)
Check TSH.  On Synthroid 75 mcg daily.

## 2021-06-16 DIAGNOSIS — I509 Heart failure, unspecified: Secondary | ICD-10-CM | POA: Diagnosis not present

## 2021-06-16 DIAGNOSIS — E785 Hyperlipidemia, unspecified: Secondary | ICD-10-CM

## 2021-06-16 DIAGNOSIS — E1169 Type 2 diabetes mellitus with other specified complication: Secondary | ICD-10-CM | POA: Diagnosis not present

## 2021-06-16 DIAGNOSIS — E1165 Type 2 diabetes mellitus with hyperglycemia: Secondary | ICD-10-CM | POA: Diagnosis not present

## 2021-06-16 DIAGNOSIS — I4821 Permanent atrial fibrillation: Secondary | ICD-10-CM | POA: Diagnosis not present

## 2021-06-16 NOTE — Addendum Note (Signed)
Addended by: Loura Back on: 06/16/2021 08:13 AM   Modules accepted: Orders

## 2021-06-17 ENCOUNTER — Telehealth: Payer: Self-pay

## 2021-06-17 NOTE — Telephone Encounter (Signed)
Pt's son called regarding a missed call from our office.

## 2021-06-18 NOTE — Telephone Encounter (Signed)
No call form our office

## 2021-07-12 ENCOUNTER — Other Ambulatory Visit: Payer: Self-pay | Admitting: Internal Medicine

## 2021-07-12 ENCOUNTER — Other Ambulatory Visit: Payer: Self-pay | Admitting: Physician Assistant

## 2021-07-12 DIAGNOSIS — E039 Hypothyroidism, unspecified: Secondary | ICD-10-CM

## 2021-07-14 NOTE — Telephone Encounter (Signed)
Prescription refill request for Xarelto received.  Indication: afib  Last office visit: 06/02/2021, Chandrasekhar Weight: 77.6 kg  Age: 75 yo  Scr: 0.89, 05/12/2021 CrCl: 67 ml/min   Refill sent.

## 2021-08-17 DIAGNOSIS — Z20822 Contact with and (suspected) exposure to covid-19: Secondary | ICD-10-CM | POA: Diagnosis not present

## 2021-08-29 ENCOUNTER — Telehealth: Payer: Self-pay | Admitting: Pharmacist

## 2021-08-29 NOTE — Chronic Care Management (AMB) (Signed)
Chronic Care Management Pharmacy Assistant   Name: Leticia Coletta  MRN: [id number] DOB: Jan 13, 1946   Reason for Encounter: Diabetes Adherence Call    Recent office visits:  06/13/2021 OV (PCP) Vivi Barrack, MD; Recommend that she stop taking MiraLAX until her diarrhea resolves.  She can use Imodium as needed.  She is already stopped metformin however has not noticed much of a difference.  Recent consult visits:  06/02/2021 OV (Cardiology) Rudean Haskell A, MD; no medication changes indicated.  Hospital visits:  None in previous 6 months  Medications: Outpatient Encounter Medications as of 08/29/2021  Medication Sig   atorvastatin (LIPITOR) 10 MG tablet Take 1 tablet (10 mg total) by mouth daily.   benzonatate (TESSALON) 100 MG capsule Take 1 capsule (100 mg total) by mouth 2 (two) times daily as needed for cough.   Cholecalciferol (VITAMIN D3 GUMMIES ADULT PO) Take 1 tablet by mouth daily in the afternoon.   docusate sodium (COLACE) 100 MG capsule TAKE 1 CAPSULE TWICE DAILY   dorzolamide-timolol (COSOPT) 22.3-6.8 MG/ML ophthalmic solution Place 1 drop into both eyes daily.   ferrous sulfate 220 (44 Fe) MG/5ML solution Take 220 mg by mouth daily.   furosemide (LASIX) 40 MG tablet Take 1 tablet (40 mg total) by mouth daily. Change to every other day after 10 Lbs weight loss  08/06/2020   levothyroxine (SYNTHROID) 75 MCG tablet TAKE 1 TABLET EVERY DAY BEFORE BREAKFAST   Menthol, Topical Analgesic, (BIOFREEZE) 4 % GEL Apply 1 application topically daily as needed (pain). Apply to right side rib cage and lateral thigh   metFORMIN (GLUCOPHAGE) 500 MG tablet Take 1 tablet (500 mg total) by mouth daily with breakfast.   metoprolol succinate (TOPROL XL) 25 MG 24 hr tablet Take 1 tablet (25 mg total) by mouth daily.   Multiple Vitamins-Minerals (MULTI-VITAMIN GUMMIES PO) Take 2 tablets by mouth daily.   pantoprazole (PROTONIX) 40 MG tablet TAKE 1 TABLET EVERY DAY (PLEASE REFILL  WITH PRIMARY CARE PROVIDER FOR ANYMORE REFILLS)   potassium chloride 20 MEQ/15ML (10%) SOLN Take 15 mLs (20 mEq total) by mouth daily.   spironolactone (ALDACTONE) 25 MG tablet Take 1 tablet (25 mg total) by mouth daily.   travoprost, benzalkonium, (TRAVATAN) 0.004 % ophthalmic solution Place 1 drop into both eyes at bedtime.    vitamin B-12 (CYANOCOBALAMIN) 500 MCG tablet Take 500 mcg by mouth daily.    XARELTO 20 MG TABS tablet TAKE 1 TABLET EVERY DAY WITH SUPPER   No facility-administered encounter medications on file as of 08/29/2021.   Recent Relevant Labs: Lab Results  Component Value Date/Time   HGBA1C 7.0 (H) 05/12/2021 09:59 AM   HGBA1C 6.8 (H) 01/21/2021 03:13 PM   HGBA1C 6.5 04/08/2015 12:00 AM   MICROALBUR <0.7 05/15/2021 10:53 AM   MICROALBUR <0.7 01/22/2021 11:31 AM    Kidney Function Lab Results  Component Value Date/Time   CREATININE 0.89 05/12/2021 09:59 AM   CREATININE 0.97 11/19/2020 03:28 PM   CREATININE 0.91 06/21/2020 04:17 PM   GFR 63.47 05/12/2021 09:59 AM   GFRNONAA >60 07/26/2020 05:00 AM   GFRAA >60 02/28/2020 03:08 PM    Current antihyperglycemic regimen:  Metformin 500 mg daily  What recent interventions/DTPs have been made to improve glycemic control:  No recent interventions or DTPs.  Have there been any recent hospitalizations or ED visits since last visit with CPP? No  Patient denies hypoglycemic symptoms.  Patient denies hyperglycemic symptoms.  How often are you checking your  blood sugar? Once a week Patient did not have any numbers to report. Patient did not remember her last home glucose level.  During the week, how often does your blood glucose drop below 70? Never  Are you checking your feet daily/regularly? Yes, patient checks her feet regularly.  Adherence Review: Is the patient currently on a STATIN medication? Yes Is the patient currently on ACE/ARB medication? No Does the patient have >5 day gap between last estimated fill  dates? No   Care Gaps: Medicare Annual Wellness: Completed 05/26/2021 Ophthalmology Exam: Next due on 02/25/2022 Foot Exam: Overdue since 06/15/2019 Hemoglobin A1C: 7.0% on 05/12/2021 Fecal DNA (Cologuard): Next due on 05/30/2024 Dexa Scan: Ordered on 08/20/2020 Mammogram:   Future Appointments  Date Time Provider [place]  11/10/2021  9:20 AM Vivi Barrack, MD [facility]  11/25/2021  3:00 PM [facility] CCM PHARMACIST [facility]  06/08/2022  2:30 PM [facility] HEALTH COACH [facility]   Star Rating Drugs: Metformin 1000 mg last filled 08/23/2020 90 DS Atorvastatin 10 mg last filled 01/14/2021 90 DS - patient son states the patient is still taking this medication.  April D Calhoun, [place] Pharmacist Assistant [phone]

## 2021-09-17 ENCOUNTER — Other Ambulatory Visit: Payer: Self-pay | Admitting: Physician Assistant

## 2021-09-17 ENCOUNTER — Other Ambulatory Visit: Payer: Self-pay | Admitting: Family Medicine

## 2021-09-17 DIAGNOSIS — H401134 Primary open-angle glaucoma, bilateral, indeterminate stage: Secondary | ICD-10-CM | POA: Diagnosis not present

## 2021-09-17 MED ORDER — POTASSIUM CHLORIDE 20 MEQ/15ML (10%) PO SOLN: 20.0000 meq | Freq: Every day | ORAL | 2 refills | Status: DC

## 2021-09-17 NOTE — Telephone Encounter (Signed)
Pt's daughter called for pt's refills.   potassium chloride 20 MEQ/15ML (10%) [place], [place] Phone:  [phone]  Fax:  [phone]

## 2021-09-18 ENCOUNTER — Other Ambulatory Visit: Payer: Self-pay | Admitting: *Deleted

## 2021-09-18 MED ORDER — POTASSIUM CHLORIDE 20 MEQ/15ML (10%) PO SOLN: 20.0000 meq | Freq: Every day | ORAL | 2 refills | Status: DC

## 2021-09-20 ENCOUNTER — Other Ambulatory Visit: Payer: Self-pay | Admitting: Physician Assistant

## 2021-09-20 DIAGNOSIS — E7800 Pure hypercholesterolemia, unspecified: Secondary | ICD-10-CM

## 2021-10-18 ENCOUNTER — Encounter: Payer: Self-pay | Admitting: Family Medicine

## 2021-10-20 ENCOUNTER — Other Ambulatory Visit: Payer: Self-pay | Admitting: *Deleted

## 2021-10-20 MED ORDER — PANTOPRAZOLE SODIUM 40 MG PO TBEC: DELAYED_RELEASE_TABLET | ORAL | 0 refills | Status: DC

## 2021-10-20 NOTE — Telephone Encounter (Signed)
Rx send to pharmacy, [place] ?

## 2021-11-06 ENCOUNTER — Encounter: Payer: Self-pay | Admitting: Family Medicine

## 2021-11-07 ENCOUNTER — Telehealth: Payer: Self-pay

## 2021-11-07 ENCOUNTER — Other Ambulatory Visit: Payer: Self-pay

## 2021-11-07 ENCOUNTER — Encounter (HOSPITAL_COMMUNITY): Payer: Self-pay

## 2021-11-07 ENCOUNTER — Observation Stay (HOSPITAL_COMMUNITY)
Admission: EM | Admit: 2021-11-07 | Discharge: 2021-11-08 | Disposition: A | Discharge status: Home / Self Care | Payer: Medicare Other | Attending: Family Medicine | Admitting: Family Medicine

## 2021-11-07 ENCOUNTER — Emergency Department (HOSPITAL_COMMUNITY): Payer: Medicare Other

## 2021-11-07 DIAGNOSIS — Z0389 Encounter for observation for other suspected diseases and conditions ruled out: Secondary | ICD-10-CM | POA: Diagnosis not present

## 2021-11-07 DIAGNOSIS — Z7984 Long term (current) use of oral hypoglycemic drugs: Secondary | ICD-10-CM | POA: Diagnosis not present

## 2021-11-07 DIAGNOSIS — R0689 Other abnormalities of breathing: Secondary | ICD-10-CM | POA: Diagnosis not present

## 2021-11-07 DIAGNOSIS — R509 Fever, unspecified: Secondary | ICD-10-CM | POA: Diagnosis not present

## 2021-11-07 DIAGNOSIS — I5042 Chronic combined systolic (congestive) and diastolic (congestive) heart failure: Secondary | ICD-10-CM | POA: Insufficient documentation

## 2021-11-07 DIAGNOSIS — Z79899 Other long term (current) drug therapy: Secondary | ICD-10-CM | POA: Diagnosis not present

## 2021-11-07 DIAGNOSIS — I517 Cardiomegaly: Secondary | ICD-10-CM | POA: Diagnosis not present

## 2021-11-07 DIAGNOSIS — I10 Essential (primary) hypertension: Secondary | ICD-10-CM | POA: Diagnosis not present

## 2021-11-07 DIAGNOSIS — E1165 Type 2 diabetes mellitus with hyperglycemia: Secondary | ICD-10-CM | POA: Diagnosis not present

## 2021-11-07 DIAGNOSIS — J441 Chronic obstructive pulmonary disease with (acute) exacerbation: Secondary | ICD-10-CM | POA: Insufficient documentation

## 2021-11-07 DIAGNOSIS — I110 Hypertensive heart disease with heart failure: Secondary | ICD-10-CM | POA: Diagnosis not present

## 2021-11-07 DIAGNOSIS — Z7901 Long term (current) use of anticoagulants: Secondary | ICD-10-CM | POA: Diagnosis not present

## 2021-11-07 DIAGNOSIS — Z8542 Personal history of malignant neoplasm of other parts of uterus: Secondary | ICD-10-CM | POA: Diagnosis not present

## 2021-11-07 DIAGNOSIS — R531 Weakness: Secondary | ICD-10-CM | POA: Diagnosis not present

## 2021-11-07 DIAGNOSIS — R000 Tachycardia, unspecified: Secondary | ICD-10-CM | POA: Diagnosis not present

## 2021-11-07 DIAGNOSIS — E039 Hypothyroidism, unspecified: Secondary | ICD-10-CM | POA: Insufficient documentation

## 2021-11-07 DIAGNOSIS — N179 Acute kidney failure, unspecified: Secondary | ICD-10-CM | POA: Diagnosis present

## 2021-11-07 DIAGNOSIS — I4821 Permanent atrial fibrillation: Secondary | ICD-10-CM | POA: Diagnosis not present

## 2021-11-07 DIAGNOSIS — R519 Headache, unspecified: Secondary | ICD-10-CM | POA: Diagnosis not present

## 2021-11-07 DIAGNOSIS — U071 COVID-19: Secondary | ICD-10-CM | POA: Diagnosis not present

## 2021-11-07 LAB — URINALYSIS, ROUTINE W REFLEX MICROSCOPIC
Bilirubin Urine: NEGATIVE
Glucose, UA: NEGATIVE mg/dL
Hgb urine dipstick: NEGATIVE
Ketones, ur: NEGATIVE mg/dL
Leukocytes,Ua: NEGATIVE
Nitrite: NEGATIVE
Protein, ur: NEGATIVE mg/dL
Specific Gravity, Urine: 1.016 (ref 1.005–1.030)
pH: 5 (ref 5.0–8.0)

## 2021-11-07 LAB — CBC WITH DIFFERENTIAL/PLATELET
Abs Immature Granulocytes: 0.04 10*3/uL (ref 0.00–0.07)
Basophils Absolute: 0 10*3/uL (ref 0.0–0.1)
Basophils Relative: 0 %
Eosinophils Absolute: 0 10*3/uL (ref 0.0–0.5)
Eosinophils Relative: 0 %
HCT: 37.8 % (ref 36.0–46.0)
Hemoglobin: 12 g/dL (ref 12.0–15.0)
Immature Granulocytes: 1 %
Lymphocytes Relative: 20 %
Lymphs Abs: 1.5 10*3/uL (ref 0.7–4.0)
MCH: 30.7 pg (ref 26.0–34.0)
MCHC: 31.7 g/dL (ref 30.0–36.0)
MCV: 96.7 fL (ref 80.0–100.0)
Monocytes Absolute: 0.7 10*3/uL (ref 0.1–1.0)
Monocytes Relative: 9 %
Neutro Abs: 5.4 10*3/uL (ref 1.7–7.7)
Neutrophils Relative %: 70 %
Platelets: 240 10*3/uL (ref 150–400)
RBC: 3.91 MIL/uL (ref 3.87–5.11)
RDW: 16.4 % — ABNORMAL HIGH (ref 11.5–15.5)
WBC: 7.6 10*3/uL (ref 4.0–10.5)
nRBC: 0 % (ref 0.0–0.2)

## 2021-11-07 LAB — GLUCOSE, CAPILLARY: Glucose-Capillary: 165 mg/dL — ABNORMAL HIGH (ref 70–99)

## 2021-11-07 LAB — COMPREHENSIVE METABOLIC PANEL
ALT: 15 U/L (ref 0–44)
AST: 25 U/L (ref 15–41)
Albumin: 3.1 g/dL — ABNORMAL LOW (ref 3.5–5.0)
Alkaline Phosphatase: 95 U/L (ref 38–126)
Anion gap: 9 (ref 5–15)
BUN: 17 mg/dL (ref 8–23)
CO2: 27 mmol/L (ref 22–32)
Calcium: 8.6 mg/dL — ABNORMAL LOW (ref 8.9–10.3)
Chloride: 97 mmol/L — ABNORMAL LOW (ref 98–111)
Creatinine, Ser: 1.26 mg/dL — ABNORMAL HIGH (ref 0.44–1.00)
GFR, Estimated: 45 mL/min — ABNORMAL LOW (ref >60)
Glucose, Bld: 226 mg/dL — ABNORMAL HIGH (ref 70–99)
Potassium: 3.5 mmol/L (ref 3.5–5.1)
Sodium: 133 mmol/L — ABNORMAL LOW (ref 135–145)
Total Bilirubin: 0.7 mg/dL (ref 0.3–1.2)
Total Protein: 6.6 g/dL (ref 6.5–8.1)

## 2021-11-07 LAB — RESP PANEL BY RT-PCR (FLU A&B, COVID) ARPGX2
Influenza A by PCR: NEGATIVE
Influenza B by PCR: NEGATIVE
SARS Coronavirus 2 by RT PCR: POSITIVE — AB

## 2021-11-07 LAB — BRAIN NATRIURETIC PEPTIDE: B Natriuretic Peptide: 131.5 pg/mL — ABNORMAL HIGH (ref 0.0–100.0)

## 2021-11-07 LAB — PROTIME-INR
INR: 1.6 — ABNORMAL HIGH (ref 0.8–1.2)
Prothrombin Time: 18.7 seconds — ABNORMAL HIGH (ref 11.4–15.2)

## 2021-11-07 LAB — TSH: TSH: 0.981 u[IU]/mL (ref 0.350–4.500)

## 2021-11-07 LAB — LACTIC ACID, PLASMA
Lactic Acid, Venous: 2.5 mmol/L (ref 0.5–1.9)
Lactic Acid, Venous: 2.5 mmol/L (ref 0.5–1.9)

## 2021-11-07 LAB — TROPONIN I (HIGH SENSITIVITY)
Troponin I (High Sensitivity): 11 ng/L (ref <18)
Troponin I (High Sensitivity): 8 ng/L (ref <18)

## 2021-11-07 LAB — APTT: aPTT: 43 seconds — ABNORMAL HIGH (ref 24–36)

## 2021-11-07 IMAGING — CT CT HEAD W/O CM
4 series · 17 of 47 positions shown, 19 images · non-contrast
Comparison: [DATE].

CLINICAL DATA: Headache.



[Series 3: head wo · axial · 0.39mm/px · z∈[-122,-2]mm · 7 of 32 slices shown, 9 images]
[im 4/32  brain]
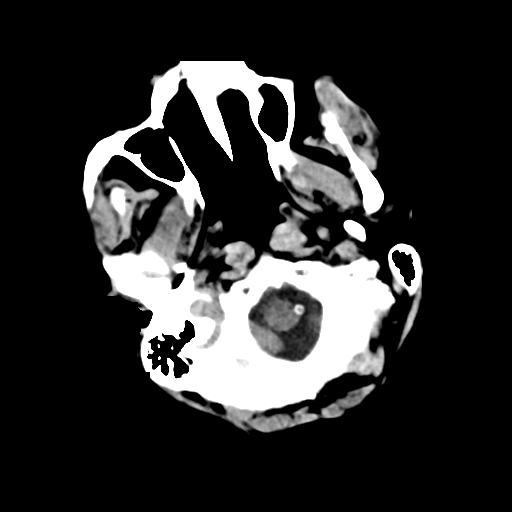
[im 4/32  bone]
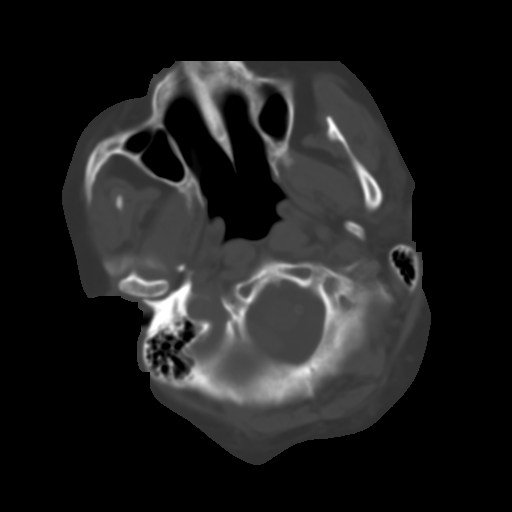
[im 8/32  brain]
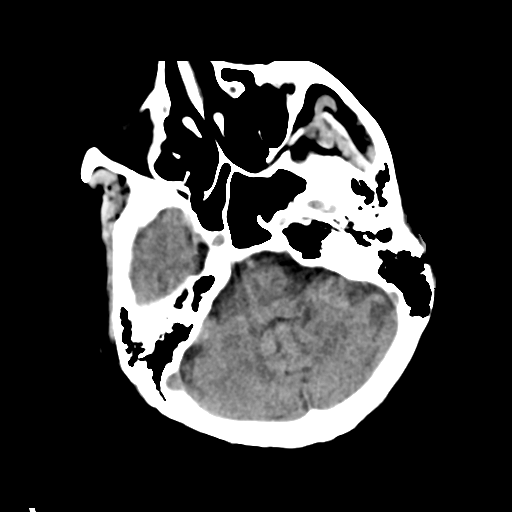
[im 12/32  brain]
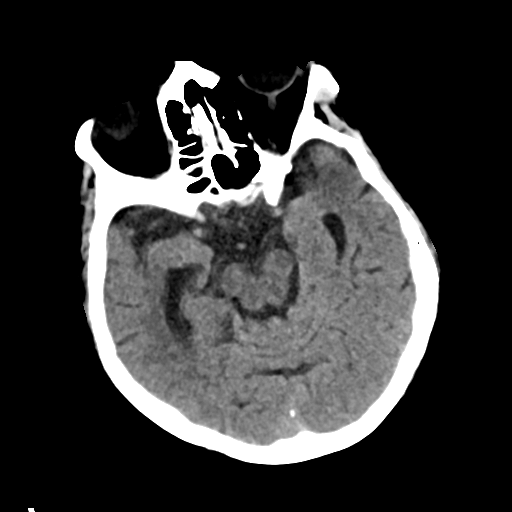
[im 16/32  brain]
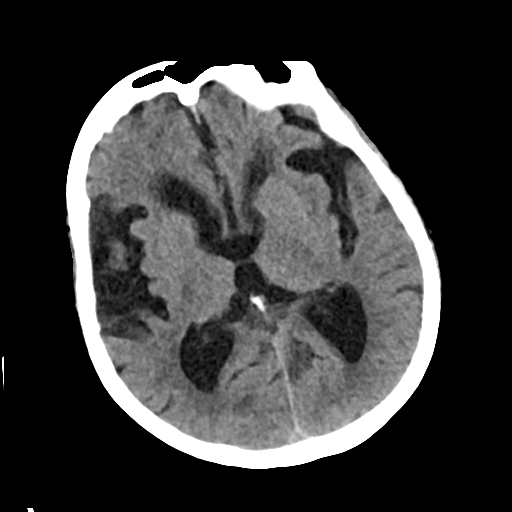
[im 20/32  brain]
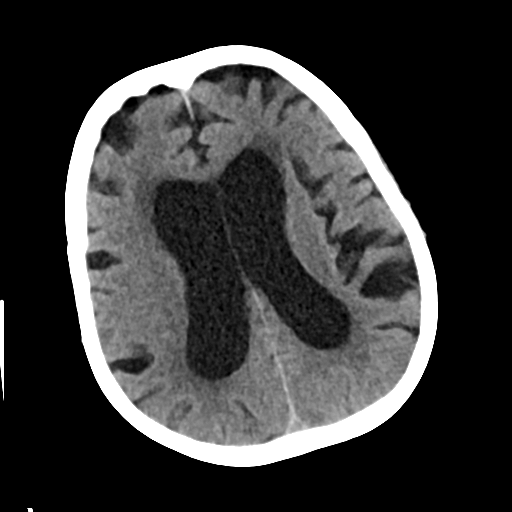
[im 20/32  bone]
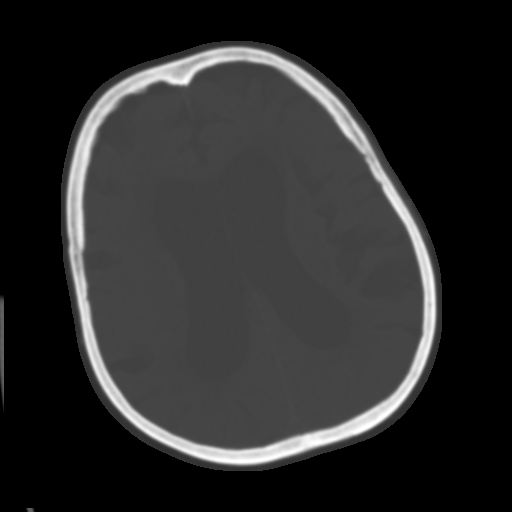
[im 24/32  brain]
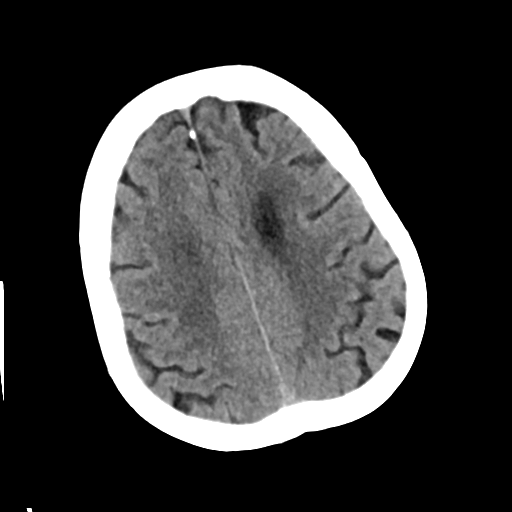
[im 28/32  brain]
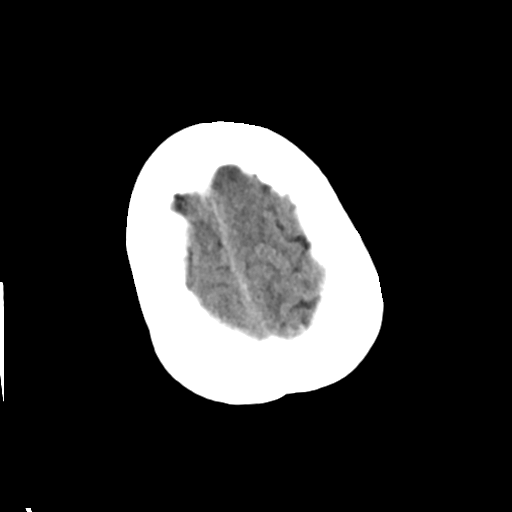

[Series 4: head bone · axial · 0.39mm/px · z∈[-124,-68]mm · 4 of 80 slices shown]
[im 8/80  bone]
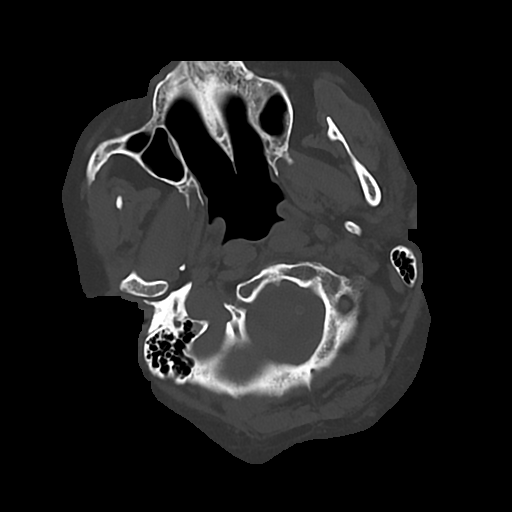
[im 16/80  bone]
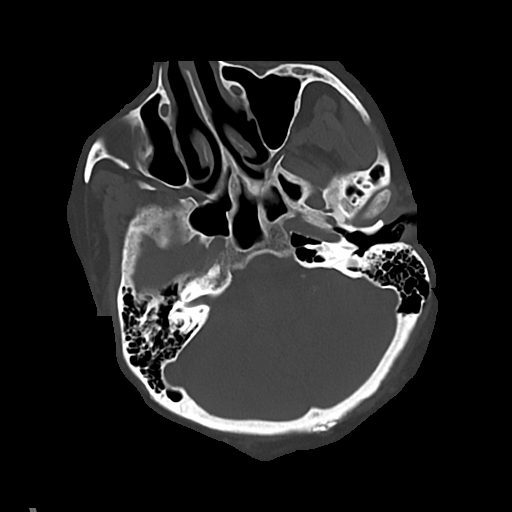
[im 24/80  bone]
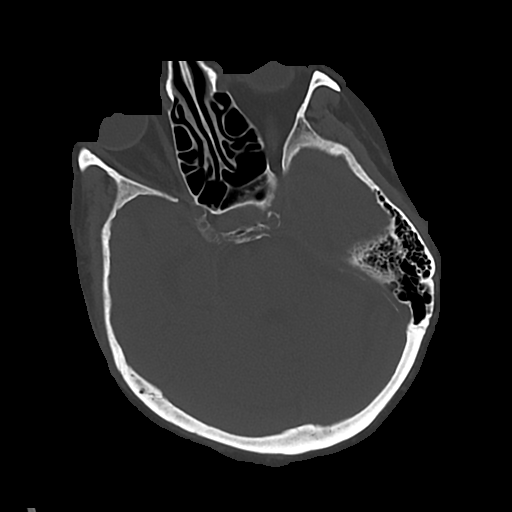
[im 36/80  bone]
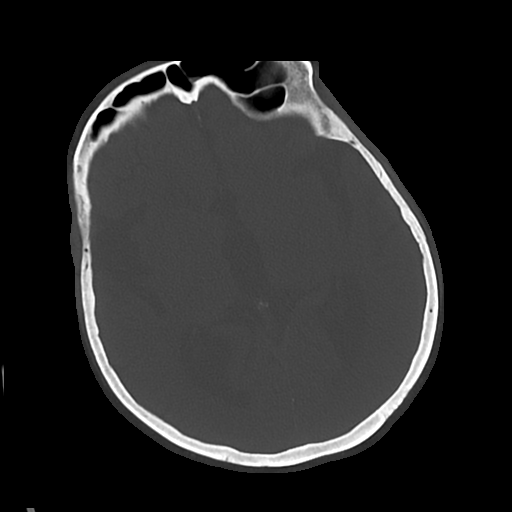

[Series 5: cor soft · coronal · 0.31mm/px · 3 of 65 slices shown]
[im 22/65  brain]
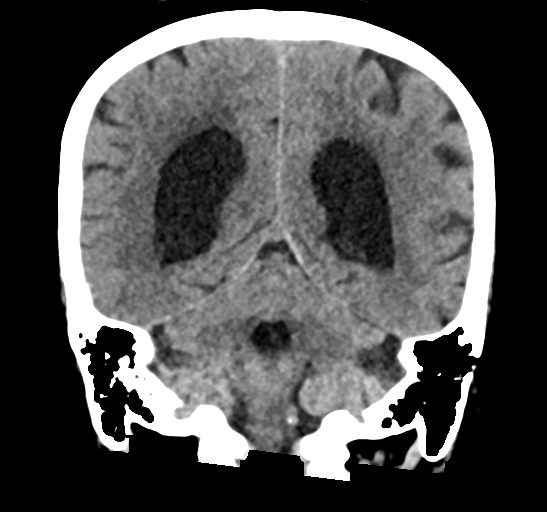
[im 29/65  brain]
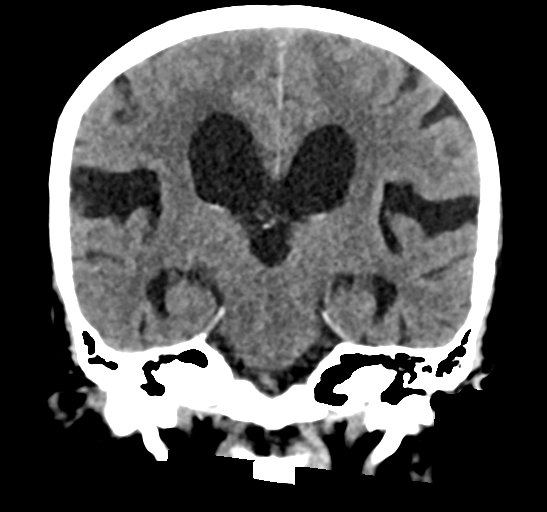
[im 36/65  brain]
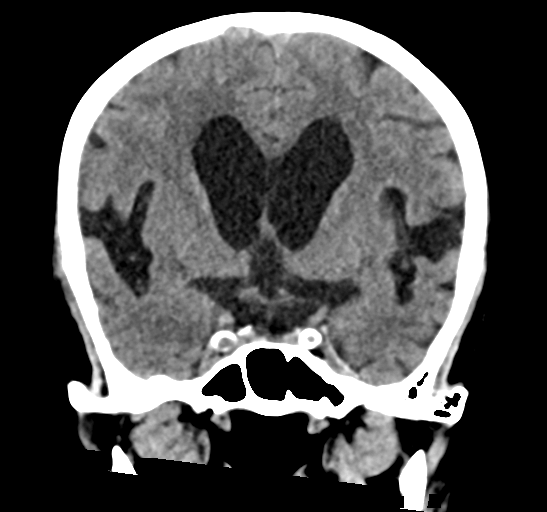

[Series 6: sag soft · sagittal · 0.31mm/px · 3 of 57 slices shown]
[im 20/57  brain]
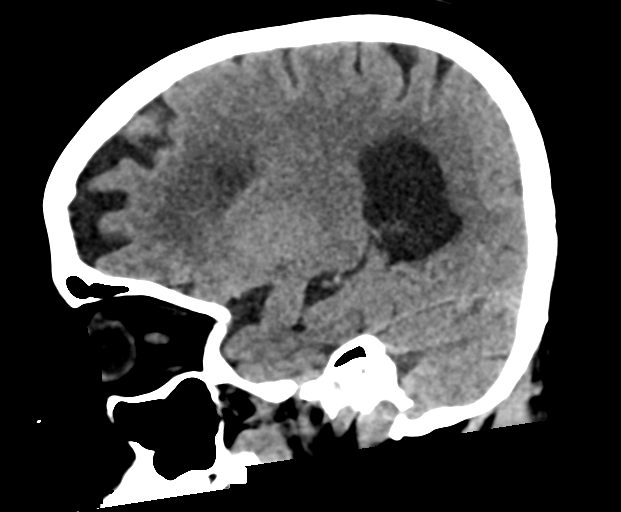
[im 29/57  brain]
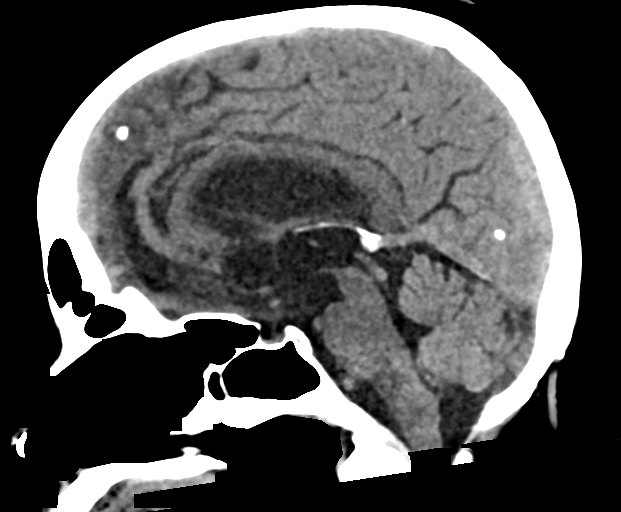
[im 37/57  brain]
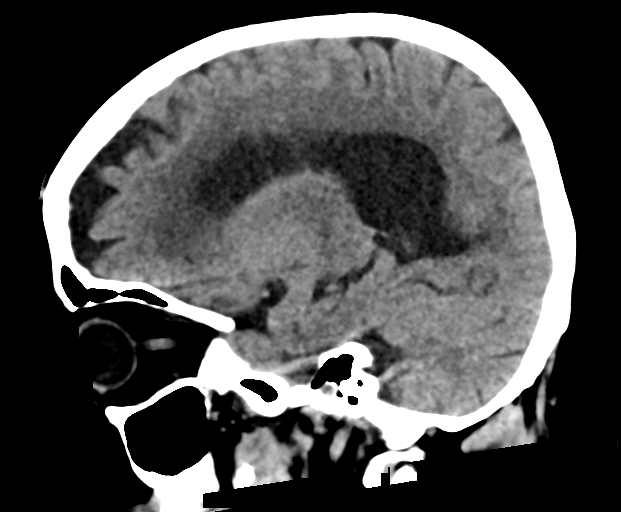

[17 of 47 positions shown; findings below may reference images not displayed]

FINDINGS: Brain: Mild diffuse cortical atrophy is noted. No mass effect or
midline shift is noted. Ventricular size is within normal limits.
There is no evidence of mass lesion, hemorrhage or acute infarction.

Vascular: No hyperdense vessel or unexpected calcification.

Skull: Normal. Negative for fracture or focal lesion.

Sinuses/Orbits: No acute finding.

Other: None.
IMPRESSION: No acute intracranial abnormality seen.

## 2021-11-07 IMAGING — DX DG CHEST 1V PORT
1 series · 1 of 1 positions shown · non-contrast
Comparison: [DATE].

CLINICAL DATA: Questionable sepsis - evaluate for abnormality

EXAM:
PORTABLE CHEST 1 VIEW

[chest]
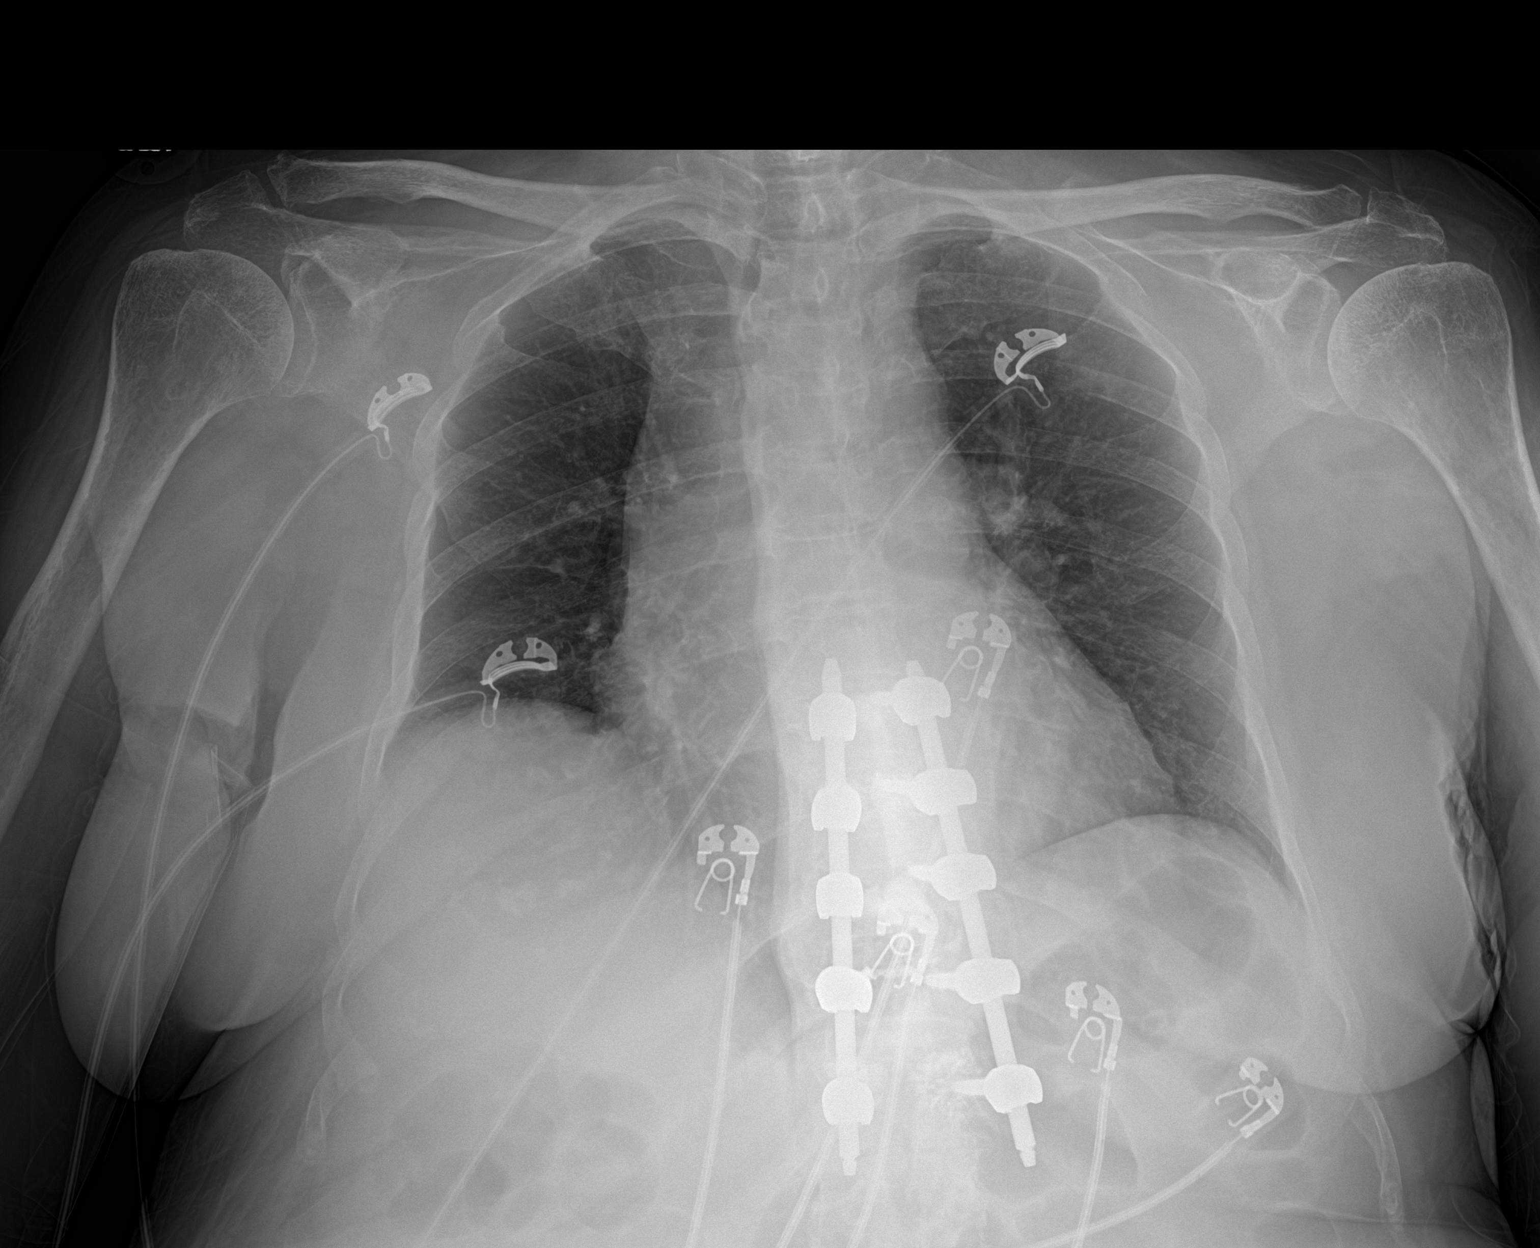

[1 of 1 positions shown; findings below may reference images not displayed]

FINDINGS: No consolidation. No visible pleural effusions or pneumothorax.
Similar enlargement the cardiac silhouette. Lower thoracic spine
fixation hardware.
IMPRESSION: No evidence of acute cardiopulmonary disease.

## 2021-11-07 MED ORDER — FERROUS SULFATE 220 (44 FE) MG/5ML PO ELIX: 220.0000 mg | ORAL_SOLUTION | Freq: Every day | ORAL | Status: DC

## 2021-11-07 MED ORDER — ATORVASTATIN CALCIUM 10 MG PO TABS
10.0000 mg | ORAL_TABLET | Freq: Every day | ORAL | Status: DC
  Administered 2021-11-08: 10 mg via ORAL
  Filled 2021-11-07: qty 1

## 2021-11-07 MED ORDER — SODIUM CHLORIDE 0.9 % IV SOLN
2.0000 g | Freq: Once | INTRAVENOUS | Status: AC
  Administered 2021-11-07: 2 g via INTRAVENOUS
  Filled 2021-11-07: qty 2

## 2021-11-07 MED ORDER — LACTATED RINGERS IV SOLN: INTRAVENOUS | Status: AC

## 2021-11-07 MED ORDER — VITAMIN B-12 1000 MCG PO TABS
500.0000 ug | ORAL_TABLET | Freq: Every day | ORAL | Status: DC
  Administered 2021-11-08: 500 ug via ORAL
  Filled 2021-11-07: qty 1

## 2021-11-07 MED ORDER — LACTATED RINGERS IV BOLUS (SEPSIS)
1000.0000 mL | Freq: Once | INTRAVENOUS | Status: AC
  Administered 2021-11-07: 1000 mL via INTRAVENOUS

## 2021-11-07 MED ORDER — VITAMIN B-12 1000 MCG PO TABS: 500.0000 ug | ORAL_TABLET | Freq: Every day | ORAL | Status: DC

## 2021-11-07 MED ORDER — BLOOD GLUCOSE TEST VI STRP: 1.0000 | ORAL_STRIP | Freq: Two times a day (BID) | 0 refills | Status: DC

## 2021-11-07 MED ORDER — TRAVOPROST (BAK FREE) 0.004 % OP SOLN
1.0000 [drp] | Freq: Every day | OPHTHALMIC | Status: DC
  Filled 2021-11-07: qty 2.5

## 2021-11-07 MED ORDER — VANCOMYCIN HCL 1500 MG/300ML IV SOLN
1500.0000 mg | Freq: Once | INTRAVENOUS | Status: AC
  Administered 2021-11-07: 1500 mg via INTRAVENOUS
  Filled 2021-11-07: qty 300

## 2021-11-07 MED ORDER — DOCUSATE SODIUM 100 MG PO CAPS
100.0000 mg | ORAL_CAPSULE | Freq: Two times a day (BID) | ORAL | Status: DC
Start: 2021-11-08 — End: 2021-11-08
  Administered 2021-11-08: 100 mg via ORAL
  Filled 2021-11-07: qty 1

## 2021-11-07 MED ORDER — PANTOPRAZOLE SODIUM 40 MG PO TBEC
40.0000 mg | DELAYED_RELEASE_TABLET | Freq: Every day | ORAL | Status: DC
  Administered 2021-11-08: 40 mg via ORAL
  Filled 2021-11-07: qty 1

## 2021-11-07 MED ORDER — METOPROLOL SUCCINATE ER 25 MG PO TB24
25.0000 mg | ORAL_TABLET | Freq: Every day | ORAL | Status: DC
  Administered 2021-11-08: 25 mg via ORAL
  Filled 2021-11-07: qty 1

## 2021-11-07 MED ORDER — VANCOMYCIN HCL IN DEXTROSE 1-5 GM/200ML-% IV SOLN: 1000.0000 mg | Freq: Once | INTRAVENOUS | Status: DC

## 2021-11-07 MED ORDER — ATORVASTATIN CALCIUM 10 MG PO TABS
10.0000 mg | ORAL_TABLET | Freq: Every day | ORAL | Status: DC
Start: 2021-11-07 — End: 2021-11-07

## 2021-11-07 MED ORDER — FERROUS SULFATE 325 (65 FE) MG PO TABS
325.0000 mg | ORAL_TABLET | Freq: Every day | ORAL | Status: DC
  Administered 2021-11-08: 325 mg via ORAL
  Filled 2021-11-07: qty 1

## 2021-11-07 MED ORDER — LACTATED RINGERS IV BOLUS (SEPSIS): 500.0000 mL | Freq: Once | INTRAVENOUS | Status: DC

## 2021-11-07 MED ORDER — LACTATED RINGERS IV BOLUS (SEPSIS): 1000.0000 mL | Freq: Once | INTRAVENOUS | Status: DC

## 2021-11-07 MED ORDER — DORZOLAMIDE HCL-TIMOLOL MAL 2-0.5 % OP SOLN
1.0000 [drp] | Freq: Every day | OPHTHALMIC | Status: DC
  Administered 2021-11-08: 1 [drp] via OPHTHALMIC
  Filled 2021-11-07: qty 10

## 2021-11-07 MED ORDER — ACETAMINOPHEN 325 MG PO TABS: 650.0000 mg | ORAL_TABLET | Freq: Four times a day (QID) | ORAL | Status: DC | PRN

## 2021-11-07 MED ORDER — LEVOTHYROXINE SODIUM 75 MCG PO TABS
75.0000 ug | ORAL_TABLET | Freq: Every day | ORAL | Status: DC
  Administered 2021-11-08: 75 ug via ORAL
  Filled 2021-11-07: qty 1

## 2021-11-07 MED ORDER — RIVAROXABAN 20 MG PO TABS: 20.0000 mg | ORAL_TABLET | Freq: Every day | ORAL | Status: DC

## 2021-11-07 NOTE — H&P (Addendum)
Family Medicine Teaching Service ?Hospital Admission History and Physical ?Service Pager: [phone] ? ?Patient name: Crystal Ashley Medical record number: [id number] ?Date of birth: [birth year] Age: 76 y.o. Gender: female ? ?Primary Care Provider: Vivi Barrack, MD ?Consultants: None ?Code Status: DNR/DNI which was confirmed with son given patient's present altered mentation. Patient has been consistently DNR/DNI for past few years ?Preferred Emergency Contact: Extended Emergency Contact Information ?Primary Emergency Contact: Pirani,Prashant ? [place] ?Mobile Phone: [phone] ?Relation: Son ? ?Chief Complaint: Confusion and weakness ? ?Assessment and Plan: ?[name] is a 76 y.o. female presenting with altered mental status and fever . PMH is significant for afib on Xarelto, diabetes, HTN, hx of thyroid problem, and endometrial cancer s/p total hysterectomy. ? ?COVID-19 ?Acute Encephalopathy/weakness ?Poor PO intake ?Per patient's son, patient was noted having increasing weakness in her upper extremities for past day. In addition, both caretaker and son noted that she had eaten less food for past 2 days. Patient was observed to also have more latency in response, mildly feverish, and mild disorientation. Son reports patient does have some chronic cognitive decline but was concerned that it was occurring more acutely at this time. Son denies cough, headache, shortness of breath.  Son does report patient had mild nausea and dry heaving.  Patient had COVID test yesterday which was negative. Per EMS, patient had a temp of 101.9 for which she has received 1 dose of tylenol and has been afebrile since. In the ED, patient had lactic acid 2.5, tachycardic 109, RR 21, so code sepsis was called and patient was started prophylactic on vancomycin and cefepime. Ucx and bcx x2 were collected. Patient was found to be COVID-19 positive. Lactic acid x2 2.5, BNP 131.5. TSH wnl, UA wnl, troponin x2 wnl,  WBC 7.6, VSS. Unclear what was extent of altered mentation but son reports that she is now almost back to baseline. Head CT and CXR showed no acute processes. Per Pharmacy, while patient has minimal symptoms and not requiring oxygen, per most recent directive, patient can be placed on Paxlovid,  Molnupiravir, or supportive care. Given patient is on Xarelto, patient cannot take paxlovid so will consider Molnupiravir tomorrow or just supportive care. ?-Admit to FPTS, attending Dr. Erin Hearing, to med-surg ?-Discontinue Vancomycin and Cefepime ?-Bcx and Ucx in process ?-Vitals per floor ?-Monitor O2 requirement ?-Consider molnupiravir ?-Tylenol 650 mg PRN for fever or mild pain ? ?AKI likely 2/2 to poor PO intake ?Cr 1.26, baseline 0.89. BUN 17. UA normal. Likely prerenal 2/2 poor po intake. ?-LR 150 ml/hr for total 20 hours ?-Encourage PO intake, on regular diet ?-Bladder scan q8 hr prn with I and O cath prn for volume >300 mL ? ?HTN ?Hx of CHF ?BP currently normotensive. Does not appear to be volume overloaded  ?Home meds: Lasix, aldactone ?-Holding home meds in light of AKI ? ?GERD, stable ?Home med: pantoprazole 40 mg ?-Resume home med ? ?Atrial Fibrillation, rate controlled ?Home med: Xarelto 20 mg qd for anticoagulation and metoprolol XL 25 mg for rate control ?Given patient's AKI and age, pharmacy recommending decreasing Xarelto to 15 mg qd ?-Resume home meds Toprol-XL 25 mg qd ?-Decrease Xarelto to 15 mg qd ? ?Constipation ?On colace 100 mg BID ?-Resume colace ? ?Chronic Anemia ?Hgb 12.0 this admission. Home med: ferrous sulfate 325 qd ?-Resume home med ? ?Glaucoma ?Home meds: cosopt, travatan ?-Resume home meds ? ?Hypothyroidism ?TSH wnl. Home med: synthroid 75 mcg ?Resume synthroid ? ?FEN/GI: Regular ?Prophylaxis: Xarelto ? ?  Disposition: Med-surg. Possibly d/c tomorrow ? ?History of Present Illness:  Crystal Ashley is a 76 y.o. female presenting with 1 day history of confusion and weakness.  Per son,  patient relies on son and caregiver to help with ADLs at baseline; however, patient appeared to be acutely weak for the past day including inability to pick up her cup to drink water which she normally is able to do. In addition, son reports patient has had poor appetite for past and poor hydration in the context of nausea and dry heaving.  Also, son reports that patient has had increased latency in response and mild confusion in the context of chronic cognitive decline. Patient today was seen sliding out of care when son was attempting to help her get out of bed which prompted son to call EMS due to increased lethargy and patient feeling feverish. Son also reports patient has not had a bowel movement in past 2 days.  Son reports patient speaks Hindi and limited [place]. ? ?On assessment for admission, we used Hindi interpreter but patient appeared to have limited response and when she did respond she responded English with "yes" and "no".  Patient was able to follow some commands but attention seems to fluctuate.  Patient denied being in pain but patient unable to cooperate further with assessment due to some confusion. Spoke with EDP Dr. Cyril Loosen and she reported that patient was very similar on their assessment. Dr. Cyril Loosen mentioned that son states that this is patient's baseline with some increased response latency. ? ?Patient is weak in BLE after a fall which requires a walker to ambulate a max of 60 ft with assistance. Patient has 24 hour care by son and caregiver when son is unavailable. Son reports patient does not smoke, does not drink alcohol and denies illicit substance use. Son was comfortable taking patient home tomorrow if no acute events overnight. ? ?Review Of Systems: Per HPI with the following additions:  ? ?Per son: ?Review of Systems  ?Constitutional:  Positive for appetite change, fatigue and fever.  ?HENT:  Negative for facial swelling.   ?Respiratory:  Negative for cough, chest tightness,  shortness of breath and wheezing.   ?Cardiovascular:  Negative for chest pain, palpitations and leg swelling.  ?Gastrointestinal:  Positive for constipation and nausea. Negative for abdominal distention, abdominal pain and diarrhea.  ?Genitourinary:  Negative for dysuria and frequency.  ?Musculoskeletal:  Negative for back pain.  ?Neurological:  Negative for seizures, syncope, numbness and headaches.  ?Psychiatric/Behavioral:  Positive for confusion.    ? ?Patient Active Problem List  ? Diagnosis Date Noted  ? Chronic obstructive pulmonary disease with (acute) exacerbation ([place]) 05/12/2021  ? Permanent atrial fibrillation ([place]) 11/19/2020  ? Pulmonary edema 07/21/2020  ? Chronic blood loss anemia 07/21/2020  ? Controlled type 2 diabetes mellitus with hyperglycemia ([place]) 07/21/2020  ? Debility 07/21/2020  ? Fusion of spine, thoracolumbar region 02/26/2020  ? Controlled type 2 diabetes mellitus with hyperglycemia, without long-term current use of insulin ([place])   ? Fracture of vertebra due to osteoporosis ([place])   ? Multiple traumatic injuries 02/12/2020  ? Chronic combined systolic (congestive) and diastolic (congestive) heart failure (H[facility])   ? B12 deficiency 09/23/2018  ? Anemia 09/23/2018  ? History of cardioversion 04/26/2018  ? Hyperlipidemia associated with type 2 diabetes mellitus ([place]) 01/04/2017  ? Vitamin D deficiency 12/15/2016  ? Glaucoma   ? Hypertension associated with diabetes ([place]) 04/19/2014  ? Hypothyroidism 04/19/2014  ? History of endometrial cancer 03/30/2014  ? ? ?  Past Medical History: ?Past Medical History:  ?Diagnosis Date  ? Atrial fibrillation ([place])   ? Cancer [facility])   ? Cataract   ? Diabetes mellitus without complication ([place])   ? Fracture 2001  ? left ankle  ? Glaucoma   ? Hypertension   ? Thyroid disease   ? ? ?Past Surgical History: ?Past Surgical History:  ?Procedure Laterality Date  ? APPLICATION OF ROBOTIC ASSISTANCE FOR SPINAL PROCEDURE N/A 02/26/2020  ? Procedure: APPLICATION OF ROBOTIC  ASSISTANCE FOR SPINAL PROCEDURE;  Surgeon: [name], MD;  Location: [facility];  Service: Neurosurgery;  Laterality: N/A;  ? CARDIOVERSION N/A 04/08/2018  ? Procedure: CARDIOVERSION;  Surgeon: Adrian Prows, MD;

## 2021-11-07 NOTE — Discharge Instructions (Addendum)
You were evaluated in the Emergency Department and after careful evaluation, we did not find any emergent condition requiring admission or further testing in the hospital. ? ?Your exam/testing today were diagnosed with COVID-19.  Please return to the ED if you develop shortness of breath, chest pain, other concerning symptoms.  ? ?Please return to the Emergency Department if you experience any worsening of your condition.  Thank you for allowing [place] to be a part of your care.  ? ? ? ? ?Person Under Monitoring Name: Crystal Ashley ? ?Location: [address] [place] [address] ? ? ?Infection Prevention Recommendations for Individuals Confirmed to have, ?or Being Evaluated for, 2019 Novel Coronavirus (COVID-19) Infection Who ?Receive Care at Home ? ?Individuals who are confirmed to have, or are being evaluated for, COVID-19 should follow the prevention steps below ?until a healthcare provider or local or state health department says they can return to normal activities. ? ?Stay home except to get medical care ?You should restrict activities outside your home, except for getting medical care. Do not go to work, school, or public ?areas, and do not use public transportation or taxis. ? ?Call ahead before visiting your doctor ?Before your medical appointment, call the healthcare provider and tell them that you have, or are being evaluated for, ?COVID-19 infection. This will help the healthcare provider?s office take steps to keep other people from getting infected. ?Ask your healthcare provider to call the local or state health department. ? ?Monitor your symptoms ?Seek prompt medical attention if your illness is worsening (e.g., difficulty breathing). Before going to your medical ?appointment, call the healthcare provider and tell them that you have, or are being evaluated for, COVID-19 infection. Ask ?your healthcare provider to call the local or state health department. ? ?Wear a facemask ?You should  wear a facemask that covers your nose and mouth when you are in the same room with other people and ?when you visit a healthcare provider. People who live with or visit you should also wear a facemask while they are in the ?same room with you. ? ?Separate yourself from other people in your home ?As much as possible, you should stay in a different room from other people in your home. Also, you should use a separate ?bathroom, if available. ? ?Avoid sharing household items ?You should not share dishes, drinking glasses, cups, eating utensils, towels, bedding, or other items with other people in ?your home. After using these items, you should wash them thoroughly with soap and water. ? ?Cover your coughs and sneezes ?Cover your mouth and nose with a tissue when you cough or sneeze, or you can cough or sneeze into your sleeve. Throw ?used tissues in a lined trash can, and immediately wash your hands with soap and water for at least 20 seconds or use an ?alcohol-based hand rub. ? ?Wash your hands ?Wash your hands often and thoroughly with soap and water for at least 20 seconds. You can use an alcohol-based hand ?sanitizer if soap and water are not available and if your hands are not visibly dirty. Avoid touching your eyes, nose, and ?mouth with unwashed hands. ? ? ?Prevention Steps for Caregivers and Household Members of ?Individuals Confirmed to have, or Being Evaluated for, COVID-19 Infection Being Cared for in the Home ? ?If you live with, or provide care at home for, a person confirmed to have, or being evaluated for, COVID-19 infection ?please follow these guidelines to prevent infection: ? ?Follow healthcare provider?s instructions ?  Make sure that you understand and can help the patient follow any healthcare provider instructions for all care. ? ?Provide for the patient?s basic needs ?You should help the patient with basic needs in the home and provide support for getting groceries, prescriptions, and ?other  personal needs. ? ?Monitor the patient?s symptoms ?If they are getting sicker, call his or her medical provider and tell them that the patient has, or is being evaluated for, ?COVID-19 infection. This will help the healthcare provider?s office take steps to keep other people from getting infected. ?Ask the healthcare provider to call the local or state health department. ? ?Limit the number of people who have contact with the patient ?If possible, have only one caregiver for the patient. ?Other household members should stay in another home or place of residence. If this is not possible, they should stay ?in another room, or be separated from the patient as much as possible. Use a separate bathroom, if available. ?Restrict visitors who do not have an essential need to be in the home. ? ?Keep older adults, very young children, and other sick people away from the patient ?Keep older adults, very young children, and those who have compromised immune systems or chronic health conditions away from the patient. This includes people with chronic heart, lung, or kidney conditions, diabetes, and cancer. ? ?Ensure good ventilation ?Make sure that shared spaces in the home have good air flow, such as from an air conditioner or an opened window, ?weather permitting. ? ?Wash your hands often ?Wash your hands often and thoroughly with soap and water for at least 20 seconds. You can use an alcohol based hand sanitizer if soap and water are not available and if your hands are not visibly dirty. ?Avoid touching your eyes, nose, and mouth with unwashed hands. ?Use disposable paper towels to dry your hands. If not available, use dedicated cloth towels and replace them when they become wet. ? ?Wear a facemask and gloves ?Wear a disposable facemask at all times in the room and gloves when you touch or have contact with the patient?s blood, body fluids, and/or secretions or excretions, such as sweat, saliva, sputum, nasal mucus, vomit,  urine, or feces.  Ensure the mask fits over your nose and mouth tightly, and do not touch it during use. ?Throw out disposable facemasks and gloves after using them. Do not reuse. ?Wash your hands immediately after removing your facemask and gloves. ?If your personal clothing becomes contaminated, carefully remove clothing and launder. Wash your hands after handling contaminated clothing. ?Place all used disposable facemasks, gloves, and other waste in a lined container before disposing them with other household waste. ?Remove gloves and wash your hands immediately after handling these items. ? ?Do not share dishes, glasses, or other household items with the patient ?Avoid sharing household items. You should not share dishes, drinking glasses, cups, eating utensils, towels, bedding, or other items with a patient who is confirmed to have, or being evaluated for, COVID-19 infection. ?After the person uses these items, you should wash them thoroughly with soap and water. ? ?[employer] thoroughly ?Immediately remove and wash clothes or bedding that have blood, body fluids, and/or secretions or excretions, such as sweat, saliva, sputum, nasal mucus, vomit, urine, or feces, on them. ?Wear gloves when handling laundry from the patient. ?Read and follow directions on labels of laundry or clothing items and detergent. In general, wash and dry with the warmest temperatures recommended on the label. ? ?Clean all areas the  individual has used often ?Clean all touchable surfaces, such as counters, tabletops, doorknobs, bathroom fixtures, toilets, phones, keyboards, tablets, and bedside tables, every day. Also, clean any surfaces that may have blood, body fluids, and/or secretions or excretions on them. ?Wear gloves when cleaning surfaces the patient has come in contact with. ?Use a diluted bleach solution (e.g., dilute bleach with 1 part bleach and 10 parts water) or a household disinfectant with a label that says  EPA-registered for coronaviruses. To make a bleach solution at home, add 1 tablespoon of bleach to 1 quart (4 cups) of water. For a larger supply, add ? cup of bleach to 1 gallon (16 cups) of water. ?Read labels of clean

## 2021-11-07 NOTE — Telephone Encounter (Signed)
Son is calling stating that patient was currently not "aware of what is going on around her."  I have transferred son over to triage nurse.

## 2021-11-07 NOTE — Telephone Encounter (Signed)
Please see Note ?

## 2021-11-07 NOTE — ED Triage Notes (Signed)
Pt bib ems from home c/o lethargic and fever. EMS checked pt temperature with ear thermometer 101.9 F. Pt received 1000 mg Tylenol en route. Pt family member at home currently has Covid but not in direct contact with the family member. Pt has cough that produces white phlegm. EMS noted diminished lung sounds posteriorly. Pt speaks Hindi. ? ?BP 117/72 ?HR 108 (Afib w/Xarelto) ?RA 97% ?RR 24 ?

## 2021-11-07 NOTE — Progress Notes (Signed)
Pharmacy Antibiotic Note ? ?Crystal Ashley is a 76 y.o. female for which pharmacy has been consulted for cefepime and vancomycin dosing for sepsis. ? ?Patient with a history of AF on xarelto, diabetes, thyroid problems, hypertension, cancer. Patient presenting with AMS and feeling warm. ? ?SCr 1.26 - above baseline ?WBC 7.6; LA 2.5; T 99 F; HR 109; RR 21 ?COVID + ? ?Plan: ?Cefepime 2g q12hr ?Vancomycin 1500 mg once then 1000 mg q48hr (eAUC 470) unless change in renal function ?Trend WBC, Fever, Renal function, & Clinical course ?F/u cultures, clinical course, WBC, fever ?De-escalate when able ? ?Height: 5' (152.4 cm) ?Weight: 77.6 kg (171 lb 1.2 oz) ?IBW/kg (Calculated) : 45.5 ? ?Temp (24hrs), Avg:99 ?F (37.2 ?C), Min:99 ?F (37.2 ?C), Max:99 ?F (37.2 ?C) ? ?No results for input(s): WBC, CREATININE, LATICACIDVEN, VANCOTROUGH, VANCOPEAK, VANCORANDOM, GENTTROUGH, GENTPEAK, GENTRANDOM, TOBRATROUGH, TOBRAPEAK, TOBRARND, AMIKACINPEAK, AMIKACINTROU, AMIKACIN in the last 168 hours.  ?CrCl cannot be calculated (Patient's most recent lab result is older than the maximum 21 days allowed.).   ? ?Allergies  ?Allergen Reactions  ? 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  Pt preference- No meat (beef/chicken/pork/turkey/etc.) with the exception of seafood.  ? Amiodarone   ?  Nausea  ? ?Antimicrobials this admission: ?cefepime 3/24 >>  ?vancomycin 3/24 >> ? ?Microbiology results: ?Pending ? ?Thank you for allowing pharmacy to be a part of this patient?s care. ? ?Lorelei Pont, PharmD, BCPS ?11/07/2021 3:36 PM ?ED Clinical Pharmacist -  [phone] ? ? ?

## 2021-11-07 NOTE — ED Notes (Signed)
Admitting MD at bedside.

## 2021-11-07 NOTE — Telephone Encounter (Signed)
Pt is currently at [facility] ED ? ?Patient ?Name: ?Crystal AGA ?Ashley ?Gender: Female ?DOB: 10/26/1945 ?Age: 76 Y 101 M 16 D ?Return ?Phone ?Number: ?[phone] ?(Primary) ?Address: ?City/ ?State/ ?Zip: [name] ? [address] ?Client [place] [facility] [place] [facility] - ?Client ?Presenter, [facility] at [place] [facility] ?Provider Dimas Chyle- MD ?Contact Type Call ?Who Is Calling Patient / Member / Family / Caregiver ?Call Type Triage / Clinical ?Caller Name Prichaud Posa ?Relationship To Patient Son ?Return Phone Number [phone] (Primary) ?Chief Complaint CONFUSION - new onset ?Reason for Call Symptomatic / Request for Health Information ?Initial Comment Caller states the patient is out of it and has some ?confusion. States it started yesterday afternoon. ?She isn't eating properly. She slipped out of bed ?but denies injury. Her blood sugar was 202. Tested ?negative for covid. Was transferred from office to ?be triaged. ?Translation No ?Nurse Assessment ?Nurse: Ottis Stain, RN, Sherrie Date/Time Eilene Ghazi Time): 11/07/2021 1:11:26 PM ?Confirm and document reason for call. If ?symptomatic, describe symptoms. ?---Caller states mother has had confusion for a while. ?Yesterday was worse and not eating much. FBS 202. ?Neg for COVID. Unable to get BP due to A. Fib. ?When trying to get out of bed this am, she slid out of ?bed but no injuries. (able to walk some with assist but ?unable to since yesterday). No fever, but feels a little ?bit warm. ?Does the patient have any new or worsening ?symptoms? ---Yes ?Will a triage be completed? ---Yes ?Related visit to physician within the last 2 weeks? ---No ?Does the PT have any chronic conditions? (i.e. ?diabetes, asthma, this includes High risk factors for ?pregnancy, etc.) ?---Yes ?List chronic conditions. 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 Fib on blood ?thinner, High Cholesterol. ?Is this a behavioral health or substance abuse call? ---No ?Guidelines ?Guideline Title  Affirmed Question Affirmed Notes Nurse Date/Time (Eastern ?Time) ?Confusion - Delirium Patient sounds very ?sick or weak to the ?triager ?Ottis Stain, RN, [place] 11/07/2021 1:17:29 ?PM ?Disp. Time (Eastern ?Time) Disposition Final User ?11/07/2021 1:09:57 PM Send to Urgent Gennette Pac, Miranda ?11/07/2021 1:24:32 PM Go to ED Now (or PCP triage) Yes Ottis Stain, RN, Sherrie ?Caller Disagree/Comply Comply ?Caller Understands Yes ?PreDisposition InappropriateToAsk ?Care Advice Given Per Guideline ?GO TO ED NOW (OR PCP TRIAGE): * IF NO PCP (PRIMARY CARE PROVIDER) SECOND-LEVEL TRIAGE: You need to be ?seen within the next hour. Go to the [place] at _____________ [place] as soon as you can. ?Comments ?User: Evlyn Clines, RN Date/Time (Eastern Time): 11/07/2021 1:19:05 PM ?States not with mother at present. Calling fiance who is with callers mother. ?Referrals ?[facility] - ED ?

## 2021-11-07 NOTE — ED Provider Notes (Signed)
?[facility] ?Provider Note ? ? ?CSN: [id number] ?Arrival date & time: 11/07/21  1501 ? ?  ? ?History ?Chief Complaint  ?Patient presents with  ? Fatigue  ? Fever  ? ? ?Crystal Ashley is a 76 y.o. female with history of atrial fibrillation on Xarelto, diabetes, thyroid problems, hypertension, cancer presenting for altered mentation and feeling warm with associated cough with white phlegm.  Temp of 101.9 w/ EMS.  Was given Tylenol at that time.  The son's fianc? was diagnosed with COVID on Monday but she does not have direct contact with family.  They did a home test on her a few days ago and she was negative.  Patient was having trouble drinking out of her bottle that she is normally able to hold and drink from a straw on her own.  Her caretaker said she was struggling with it requiring the caretaker to hold the bottle while she drank.  Patient has been slightly more confused than normal.  She knows her name and where she is at but she does not know the year/month.  Son reports that is her baseline.  Patient is weak in bilateral lower extremities after a fall and uses a walker to ambulate a max of 60 feet with assistance.  She is able to lift her lower extremities minimally.  Son reports she has been eating less and seeming sleepier than normal.  Concern she slipped out of bed. Patient speaks Hindi. ? ? ?Fever ?Associated symptoms: no chest pain and no rash   ? ?  ? ?Home Medications ?Prior to Admission medications   ?Medication Sig Start Date End Date Taking? Authorizing Provider  ?atorvastatin (LIPITOR) 10 MG tablet TAKE 1 TABLET EVERY DAY 09/22/21  Yes Vivi Barrack, MD  ?Cholecalciferol (VITAMIN D3 GUMMIES ADULT PO) Take 1 tablet by mouth daily in the afternoon.   Yes [provider]  ?docusate sodium (COLACE) 100 MG capsule TAKE 1 CAPSULE TWICE DAILY ?Patient taking differently: 200 mg daily. 04/22/21  Yes [name], PA  ?dorzolamide-timolol (COSOPT)  22.3-6.8 MG/ML ophthalmic solution Place 1 drop into both eyes 2 (two) times daily. 10/05/17  Yes [provider]  ?furosemide (LASIX) 40 MG tablet Take 1 tablet (40 mg total) by mouth daily. Change to every other day after 10 Lbs weight loss  08/06/2020 11/28/20  Yes Chandrasekhar, Mahesh A, MD  ?levothyroxine (SYNTHROID) 75 MCG tablet TAKE 1 TABLET EVERY DAY BEFORE BREAKFAST 07/14/21  Yes Vivi Barrack, MD  ?metFORMIN (GLUCOPHAGE) 500 MG tablet Take 1 tablet (500 mg total) by mouth daily with breakfast. 03/26/21  Yes [name], PA  ?metoprolol succinate (TOPROL XL) 25 MG 24 hr tablet Take 1 tablet (25 mg total) by mouth daily. 01/03/21  Yes Chandrasekhar, Mahesh A, MD  ?Multiple Vitamins-Minerals (MULTI-VITAMIN GUMMIES PO) Take 2 tablets by mouth daily.   Yes [provider]  ?pantoprazole (PROTONIX) 40 MG tablet TAKE 1 TABLET EVERY DAY (PLEASE REFILL WITH PRIMARY CARE PROVIDER FOR ANYMORE REFILLS) 10/20/21  Yes Vivi Barrack, MD  ?potassium chloride 20 MEQ/15ML (10%) SOLN Take 15 mLs (20 mEq total) by mouth daily. 09/18/21 12/17/21 Yes Vivi Barrack, MD  ?spironolactone (ALDACTONE) 25 MG tablet Take 1 tablet (25 mg total) by mouth daily. 01/03/21  Yes Chandrasekhar, Mahesh A, MD  ?travoprost, benzalkonium, (TRAVATAN) 0.004 % ophthalmic solution Place 1 drop into both eyes at bedtime.    Yes [provider]  ?vitamin B-12 (CYANOCOBALAMIN) 500 MCG tablet Take  500 mcg by mouth daily.    Yes [provider]  ?XARELTO 20 MG TABS tablet TAKE 1 TABLET EVERY DAY WITH SUPPER 07/14/21  Yes Chandrasekhar, Mahesh A, MD  ?benzonatate (TESSALON) 100 MG capsule Take 1 capsule (100 mg total) by mouth 2 (two) times daily as needed for cough. ?Patient not taking: Reported on 11/07/2021 05/21/21   [name], [place]  ?Glucose Blood (BLOOD GLUCOSE TEST STRIPS) STRP 1 each by Other route 2 (two) times daily. USE ONE TEST STRIP TWICE DAILY TO CHECK BLOOD SUGAR ?E11.9 11/07/21   Vivi Barrack, MD   ?Menthol, Topical Analgesic, (BIOFREEZE) 4 % GEL Apply 1 application topically daily as needed (pain). Apply to right side rib cage and lateral thigh    [provider]  ?   ? ?Allergies    ?Meat [alpha-gal] and Amiodarone   ? ?Review of Systems   ?Review of Systems  ?Constitutional:  Positive for activity change, appetite change, fatigue and fever.  ?Respiratory:  Negative for shortness of breath.   ?Cardiovascular:  Negative for chest pain.  ?Gastrointestinal:  Negative for abdominal pain.  ?Skin:  Negative for rash and wound.  ? ?Physical Exam ?Updated Vital Signs ?BP (!) 137/92 (BP Location: Right Arm)   Pulse 84   Temp 97.8 ?F (36.6 ?C) (Oral)   Resp 17   Ht 5' (1.524 m)   Wt 77.6 kg   SpO2 100%   BMI 33.41 kg/m?  ?Physical Exam ?Vitals and nursing note reviewed.  ?Constitutional:   ?   Appearance: She is obese.  ?HENT:  ?   Head: Normocephalic and atraumatic.  ?Eyes:  ?   Extraocular Movements: Extraocular movements intact.  ?   Pupils: Pupils are equal, round, and reactive to light.  ?Cardiovascular:  ?   Rate and Rhythm: Tachycardia present. Rhythm irregular.  ?Pulmonary:  ?   Effort: Pulmonary effort is normal. No respiratory distress.  ?   Breath sounds: No wheezing.  ?Abdominal:  ?   Tenderness: There is no abdominal tenderness. There is no guarding or rebound.  ?Musculoskeletal:  ?   Cervical back: No tenderness.  ?Neurological:  ?   Mental Status: She is alert.  ?   Cranial Nerves: Cranial nerve deficit present.  ?   Sensory: No sensory deficit.  ?   Motor: Weakness present.  ?   Comments: Oriented to person and place ?Weakness of bilateral lower extremities  ? ? ?ED Results / Procedures / Treatments   ?Labs ?(all labs ordered are listed, but only abnormal results are displayed) ?Labs Reviewed  ?RESP PANEL BY RT-PCR (FLU A&B, COVID) ARPGX2 - Abnormal; Notable for the following components:  ?    Result Value  ? SARS Coronavirus 2 by RT PCR POSITIVE (*)   ? All other components within  normal limits  ?LACTIC ACID, PLASMA - Abnormal; Notable for the following components:  ? Lactic Acid, Venous 2.5 (*)   ? All other components within normal limits  ?LACTIC ACID, PLASMA - Abnormal; Notable for the following components:  ? Lactic Acid, Venous 2.5 (*)   ? All other components within normal limits  ?COMPREHENSIVE METABOLIC PANEL - Abnormal; Notable for the following components:  ? Sodium 133 (*)   ? Chloride 97 (*)   ? Glucose, Bld 226 (*)   ? Creatinine, Ser 1.26 (*)   ? Calcium 8.6 (*)   ? Albumin 3.1 (*)   ? GFR, Estimated 45 (*)   ? All other components  within normal limits  ?CBC WITH DIFFERENTIAL/PLATELET - Abnormal; Notable for the following components:  ? RDW 16.4 (*)   ? All other components within normal limits  ?PROTIME-INR - Abnormal; Notable for the following components:  ? Prothrombin Time 18.7 (*)   ? INR 1.6 (*)   ? All other components within normal limits  ?APTT - Abnormal; Notable for the following components:  ? aPTT 43 (*)   ? All other components within normal limits  ?BRAIN NATRIURETIC PEPTIDE - Abnormal; Notable for the following components:  ? B Natriuretic Peptide 131.5 (*)   ? All other components within normal limits  ?CULTURE, BLOOD (ROUTINE X 2)  ?CULTURE, BLOOD (ROUTINE X 2)  ?URINE CULTURE  ?URINALYSIS, ROUTINE W REFLEX MICROSCOPIC  ?TSH  ?BASIC METABOLIC PANEL  ?CBC  ?TROPONIN I (HIGH SENSITIVITY)  ?TROPONIN I (HIGH SENSITIVITY)  ? ? ?EKG ?EKG Interpretation ? ?Date/Time:  Friday November 07 2021 15:06:56 EDT ?Ventricular Rate:  106 ?PR Interval:    ?QRS Duration: 85 ?QT Interval:  306 ?QTC Calculation: 407 ?R Axis:   20 ?Text Interpretation: Atrial fibrillation Low voltage, extremity leads Borderline repolarization abnormality Confirmed by Lennice Sites [phone]) on 11/07/2021 3:18:45 PM ? ?Radiology ?CT Head Wo Contrast ? ?Result Date: 11/07/2021 ?CLINICAL DATA:  Headache. EXAM: CT HEAD WITHOUT CONTRAST TECHNIQUE: Contiguous axial images were obtained from the base of the skull  through the vertex without intravenous contrast. RADIATION DOSE REDUCTION: This exam was performed according to the departmental dose-optimization program which includes automated exposure control, adjustment of the

## 2021-11-07 NOTE — ED Notes (Signed)
Notified EDP to place an order for in/out. Pt is not able to communicate when she has to pee. RN will get NT to bladder scan. ?

## 2021-11-07 NOTE — Progress Notes (Signed)
Elink following code sepsis °

## 2021-11-07 NOTE — Telephone Encounter (Signed)
Please see note.

## 2021-11-07 NOTE — ED Provider Notes (Signed)
Supervised resident visit.  Patient here with fever, generalized weakness.  Sick contacts with family member with [place].  She has had 2 negative COVID test at home.  Patient appears to be awake and alert.  Denies any cough, abdominal pain, pain with urination.  Patient was febrile with EMS but overall has unremarkable vitals here.  She was given Tylenol.  Sepsis work-up was initiated to evaluate for UTI versus pneumonia versus viral process.  Will give IV fluid bolus and start IV broad spectrum antibiotics. ? ?Upon review and interpretation of her chest x-ray there is no obvious pneumonia.  Head CT with no acute findings.  EKG per my review and interpretation shows sinus rhythm.  Upon review of her labs she is COVID-positive.  She has mild lactic acidosis at 2.5 but no significant anemia, electrolyte abnormality otherwise.  She has been given fluid bolus.  Will reevaluate and talk with family about whether or not we should admit her or not.  Hemodynamically she looks stable.  We will use shared decision about whether to admit her or discharge her at home and antiviral.  Symptoms have been for 2 days.  She does have home health care in place.  Please see resident note for further results, evaluation, disposition of the patient. ? ?This chart was dictated using voice recognition software.  Despite best efforts to proofread,  errors can occur which can change the documentation meaning.  ?  Lennice Sites, DO ?11/07/21 1841 ? ?

## 2021-11-07 NOTE — Progress Notes (Unsigned)
Assure ?

## 2021-11-08 ENCOUNTER — Encounter: Payer: Self-pay | Admitting: Family Medicine

## 2021-11-08 DIAGNOSIS — N179 Acute kidney failure, unspecified: Secondary | ICD-10-CM | POA: Diagnosis not present

## 2021-11-08 DIAGNOSIS — R5383 Other fatigue: Secondary | ICD-10-CM | POA: Diagnosis not present

## 2021-11-08 DIAGNOSIS — U071 COVID-19: Secondary | ICD-10-CM | POA: Diagnosis not present

## 2021-11-08 DIAGNOSIS — R1084 Generalized abdominal pain: Secondary | ICD-10-CM | POA: Diagnosis not present

## 2021-11-08 DIAGNOSIS — Z7401 Bed confinement status: Secondary | ICD-10-CM | POA: Diagnosis not present

## 2021-11-08 LAB — CBC
HCT: 34.5 % — ABNORMAL LOW (ref 36.0–46.0)
Hemoglobin: 11.1 g/dL — ABNORMAL LOW (ref 12.0–15.0)
MCH: 30.8 pg (ref 26.0–34.0)
MCHC: 32.2 g/dL (ref 30.0–36.0)
MCV: 95.8 fL (ref 80.0–100.0)
Platelets: 190 10*3/uL (ref 150–400)
RBC: 3.6 MIL/uL — ABNORMAL LOW (ref 3.87–5.11)
RDW: 16.4 % — ABNORMAL HIGH (ref 11.5–15.5)
WBC: 6.3 10*3/uL (ref 4.0–10.5)
nRBC: 0 % (ref 0.0–0.2)

## 2021-11-08 LAB — BASIC METABOLIC PANEL
Anion gap: 7 (ref 5–15)
BUN: 15 mg/dL (ref 8–23)
CO2: 28 mmol/L (ref 22–32)
Calcium: 8.4 mg/dL — ABNORMAL LOW (ref 8.9–10.3)
Chloride: 103 mmol/L (ref 98–111)
Creatinine, Ser: 1.05 mg/dL — ABNORMAL HIGH (ref 0.44–1.00)
GFR, Estimated: 55 mL/min — ABNORMAL LOW (ref >60)
Glucose, Bld: 147 mg/dL — ABNORMAL HIGH (ref 70–99)
Potassium: 3.6 mmol/L (ref 3.5–5.1)
Sodium: 138 mmol/L (ref 135–145)

## 2021-11-08 LAB — URINE CULTURE: Culture: NO GROWTH

## 2021-11-08 MED ORDER — RIVAROXABAN 20 MG PO TABS: 20.0000 mg | ORAL_TABLET | Freq: Every day | ORAL | Status: DC

## 2021-11-08 MED ORDER — RIVAROXABAN 15 MG PO TABS: 15.0000 mg | ORAL_TABLET | Freq: Every day | ORAL | 0 refills | Status: DC

## 2021-11-08 MED ORDER — MOLNUPIRAVIR EUA 200MG CAPSULE
4.0000 | ORAL_CAPSULE | Freq: Two times a day (BID) | ORAL | Status: DC
  Filled 2021-11-08: qty 4

## 2021-11-08 MED ORDER — RIVAROXABAN 15 MG PO TABS
15.0000 mg | ORAL_TABLET | Freq: Every day | ORAL | Status: DC
Start: 2021-11-08 — End: 2021-11-08
  Administered 2021-11-08: 15 mg via ORAL
  Filled 2021-11-08: qty 1

## 2021-11-08 MED ORDER — RIVAROXABAN 20 MG PO TABS
20.0000 mg | ORAL_TABLET | Freq: Every day | ORAL | Status: DC
Start: 2021-11-08 — End: 2021-11-08

## 2021-11-08 MED ORDER — MOLNUPIRAVIR EUA 200MG CAPSULE
4.0000 | ORAL_CAPSULE | Freq: Two times a day (BID) | ORAL | 0 refills | Status: AC
Start: 2021-11-08 — End: 2021-11-13

## 2021-11-08 NOTE — Progress Notes (Addendum)
Family Medicine Teaching Service ?Daily Progress Note ?Intern Pager: [phone] ? ?Patient name: Crystal Ashley Medical record number: [id number] ?Date of birth: 1945-11-25 Age: 76 y.o. Gender: female ? ?Primary Care Provider: Vivi Barrack, MD ?Consultants: none ?Code Status: DNR/DNI ? ?Pt Overview and Major Events to Date:  ?11/08/2021 admitted ? ?Assessment and Plan: ?Crystal Ashley is a 76 y.o. female presenting with altered mental status and fever . PMH is significant for afib on Xarelto, diabetes, HTN, hx of thyroid problem, and endometrial cancer s/p total hysterectomy. ? ?COVID-19 ?Acute Encephalopathy/Generalized Weakness  ?Poor PO intake ?Patient was easily arousable this morning.  Vital signs stable overnight on room air.  Continues to be pleasantly confused and have response latency. ?-Ucx and Bcx in process, do not expect growth given afebrile and normal WBC ?-Start molnupiravir 800 mg q12 hour x5 days ? ?AKI likely 2/2 to poor PO intake, improving ?Urinary Retention ?Cr 1.26>1.05, baseline ~0.9 ?-LR 150 ml/hr ?-1 AM bladder scan: 421 mL ?-Was able to spontaneously urinate 600 mL early this morning with purewick ? ?HTN ?Hx of CHF ?BP currently normotensive. Does not appear to be volume overloaded  ?Home meds: Lasix, aldactone ?-Holding home meds in light of AKI ?  ?GERD, stable ?Home med: pantoprazole 40 mg ?-Resume home med ?  ?Atrial Fibrillation, rate controlled ?Home med: Xarelto 20 mg qd for anticoagulation and metoprolol XL 25 mg for rate control ?Given patient's AKI and age, pharmacy recommending decreasing Xarelto to 15 mg qd ?-Resume home meds Toprol-XL 25 mg qd ?-Decrease Xarelto to 15 mg qd ?  ?Constipation ?On colace 100 mg BID ?-Resume colace ?  ?Chronic Anemia ?Hgb 12.0 this admission. Home med: ferrous sulfate 325 qd ?-Resume home med ?  ?Glaucoma ?Home meds: cosopt, travatan ?-Resume home meds ?  ?Hypothyroidism ?Home med: synthroid 75 mcg ?-Resume home med ? ?FEN/GI: regular  diet ?PPx: xarelto ?Dispo:Home today. Barriers include ongoing medical treatment.  ? ?Subjective:  ?Patient seen and assessed at bedside. Patient was much more alert. Patient response is much more spontaneous. Called son and he was agreeable to starting molnupiravir and decreased xarelto dosage. Son will visit patient in the morning and assess patient's mental status but he reports she was back at her baseline yesterday. ? ?Objective: ?Temp:  [97.8 ?F (36.6 ?C)-99 ?F (37.2 ?C)] 97.8 ?F (36.6 ?C) (03/24 2302) ?Pulse Rate:  [69-110] 84 (03/24 2302) ?Resp:  [16-24] 17 (03/24 2302) ?BP: (103-137)/(65-92) 137/92 (03/24 2302) ?SpO2:  [96 %-100 %] 100 % (03/24 2302) ?Weight:  [77.6 kg] 77.6 kg (03/24 1517) ?Physical Exam: ?General: resting comfortably ?Cardiovascular: irregular rhythm ?Respiratory: CTAB ?Abdomen: soft nontender ?Extremities: able to move all extremities. No pitting edema noted ? ?Laboratory: ?Recent Labs  ?Lab 11/07/21 ?1551  ?WBC 7.6  ?HGB 12.0  ?HCT 37.8  ?PLT 240  ? ?Recent Labs  ?Lab 11/07/21 ?1551  ?NA 133*  ?K 3.5  ?CL 97*  ?CO2 27  ?BUN 17  ?CREATININE 1.26*  ?CALCIUM 8.6*  ?PROT 6.6  ?BILITOT 0.7  ?ALKPHOS 95  ?ALT 15  ?AST 25  ?GLUCOSE 226*  ? ? ? ?Imaging/Diagnostic Tests: ?CT Head Wo Contrast ? ?Result Date: 11/07/2021 ?CLINICAL DATA:  Headache. EXAM: CT HEAD WITHOUT CONTRAST TECHNIQUE: Contiguous axial images were obtained from the base of the skull through the vertex without intravenous contrast. RADIATION DOSE REDUCTION: This exam was performed according to the departmental dose-optimization program which includes automated exposure control, adjustment of the mA and/or kV according to patient size and/or  use of iterative reconstruction technique. COMPARISON:  May 26, 2020. FINDINGS: Brain: Mild diffuse cortical atrophy is noted. No mass effect or midline shift is noted. Ventricular size is within normal limits. There is no evidence of mass lesion, hemorrhage or acute infarction. Vascular:  No hyperdense vessel or unexpected calcification. Skull: Normal. Negative for fracture or focal lesion. Sinuses/Orbits: No acute finding. Other: None. IMPRESSION: No acute intracranial abnormality seen. Electronically Signed   By: Marijo Conception M.D.   On: 11/07/2021 16:40  ? ?DG Chest Port 1 View ? ?Result Date: 11/07/2021 ?CLINICAL DATA:  Questionable sepsis - evaluate for abnormality EXAM: PORTABLE CHEST 1 VIEW COMPARISON:  07/23/2020. FINDINGS: No consolidation. No visible pleural effusions or pneumothorax. Similar enlargement the cardiac silhouette. Lower thoracic spine fixation hardware. IMPRESSION: No evidence of acute cardiopulmonary disease. Electronically Signed   By: Margaretha Sheffield M.D.   On: 11/07/2021 16:21   ? ?France Ravens, MD ?11/08/2021, 1:30 AM ?PGY-1, [place] ?[place] Intern pager: [phone], text pages welcome ?

## 2021-11-08 NOTE — Discharge Summary (Addendum)
Family Medicine Teaching Service ?Hospital Discharge Summary ? ?Patient name: Crystal Ashley Medical record number: [id number] ?Date of birth: 16-Aug-1946 Age: 76 y.o. Gender: female ?Date of Admission: 11/07/2021  Date of Discharge: 11/08/21 ?Admitting Physician: France Ravens, MD ? ?Primary Care Provider: Vivi Barrack, MD ?Consultants: none ? ?Indication for Hospitalization: AKI ? ?Discharge Diagnoses/Problem List:  ?Acute Kidney Injury ?COVID 19  ?Hypertension ?GERD ?Atrial Fibrillation ?Constipation ?Chronic Anemia ?Glaucoma ?Hypothyroidism ? ?Disposition: home with son ? ?Discharge Condition: Stable ? ?Discharge Exam:  ?General: resting comfortably, NAD ?Cardiovascular: irregular rhythm ?Respiratory: CTAB. Occasionally had dry cough. ?Abdomen: soft nontender ?Extremities: able to move all extremities. No pitting edema noted ? ?Brief Hospital Course:  ?Crystal Ashley is a 76 y.o.female with a history of A-fib on Xarelto, diabetes, hypertension, acquired hypothyroidism, GERD, chronic anemia, glaucoma who was admitted to the [facility] Teaching Service at [facility] for AKI and positive COVID-19. Her hospital course is detailed below: ? ?COVID-19 ?Acute Encephalopathy/weakness ?Poor PO intake ?Patient initially presented with fever and tachycardia with lactic acid 2.5.  Code sepsis was initiated and urine culture/blood cultures were collected.  Patient received 1 dose of IV cefepime and IV vancomycin.  Patient was then found to be COVID-19 positive.  BNP 131.5.  Patient CBC, UA, TSH, troponin x2 unremarkable.  Head CT and chest x-ray showed no acute processes.  Patient was started on 5-day course of molnupiravir.  Patient's vital signs stable throughout hospitalization and did not have new oxygen requirement. ? ?AKI ?Patient's creatinine on admission was 1.26 from baseline of 0.89.  Based on history, this is likely due to poor p.o. intake for past 2 days prior to admission.  Patient was able to spontaneously  urinate and with continuous LR, her creatinine went down to 1.05.  ? ?Atrial Fibrillation, rate controlled ?Patient was continued on metoprolol XL 25 mg daily for rate control.  Patient's Xarelto was decreased to 15 mg qd given patient's AKI and age. ? ?Other chronic conditions were medically managed with home medications and formulary alternatives as necessary (chronic anemia, GERD, hypertension, glaucoma, hypothyroidism) ? ?PCP Follow-up Recommendations: ?-BMP to assess Cr ?-Assess cough and if she requires supplemental oxygen ?-Assess for dementia ? ? ?Significant Procedures: none ? ?Significant Labs and Imaging:  ?Recent Labs  ?Lab 11/07/21 ?1551 11/08/21 ?0114  ?WBC 7.6 6.3  ?HGB 12.0 11.1*  ?HCT 37.8 34.5*  ?PLT 240 190  ? ?Recent Labs  ?Lab 11/07/21 ?1551 11/08/21 ?0114  ?NA 133* 138  ?K 3.5 3.6  ?CL 97* 103  ?CO2 27 28  ?GLUCOSE 226* 147*  ?BUN 17 15  ?CREATININE 1.26* 1.05*  ?CALCIUM 8.6* 8.4*  ?ALKPHOS 95  --   ?AST 25  --   ?ALT 15  --   ?ALBUMIN 3.1*  --   ? ? ? ? ?Results/Tests Pending at Time of Discharge: Urine Culture and Blood Culture x2 ? ?Discharge Medications:  ?Allergies as of 11/08/2021   ? ?   Reactions  ? Meat [alpha-gal] Other (See Comments)  ? Pt preference- No meat (beef/chicken/pork/turkey/etc.) with the exception of seafood.  ? Amiodarone   ? Nausea  ? ?  ? ?  ?Medication List  ?  ? ?TAKE these medications   ? ?atorvastatin 10 MG tablet ?Commonly known as: LIPITOR ?TAKE 1 TABLET EVERY DAY ?  ?benzonatate 100 MG capsule ?Commonly known as: TESSALON ?Take 1 capsule (100 mg total) by mouth 2 (two) times daily as needed for cough. ?  ?Biofreeze 4 % Gel ?  Generic drug: Menthol (Topical Analgesic) ?Apply 1 application topically daily as needed (pain). Apply to right side rib cage and lateral thigh ?  ?BLOOD GLUCOSE TEST STRIPS Strp ?1 each by Other route 2 (two) times daily. USE ONE TEST STRIP TWICE DAILY TO CHECK BLOOD SUGAR ?E11.9 ?  ?docusate sodium 100 MG capsule ?Commonly known as:  COLACE ?TAKE 1 CAPSULE TWICE DAILY ?What changed:  ?how much to take ?how to take this ?when to take this ?  ?dorzolamide-timolol 22.3-6.8 MG/ML ophthalmic solution ?Commonly known as: COSOPT ?Place 1 drop into both eyes 2 (two) times daily. ?  ?furosemide 40 MG tablet ?Commonly known as: LASIX ?Take 1 tablet (40 mg total) by mouth daily. Change to every other day after 10 Lbs weight loss  08/06/2020 ?  ?levothyroxine 75 MCG tablet ?Commonly known as: SYNTHROID ?TAKE 1 TABLET EVERY DAY BEFORE BREAKFAST ?  ?metFORMIN 500 MG tablet ?Commonly known as: GLUCOPHAGE ?Take 1 tablet (500 mg total) by mouth daily with breakfast. ?  ?metoprolol succinate 25 MG 24 hr tablet ?Commonly known as: Toprol XL ?Take 1 tablet (25 mg total) by mouth daily. ?  ?molnupiravir EUA 200 mg Caps capsule ?Commonly known as: LAGEVRIO ?Take 4 capsules (800 mg total) by mouth 2 (two) times daily for 5 days. ?  ?MULTI-VITAMIN GUMMIES PO ?Take 2 tablets by mouth daily. ?  ?pantoprazole 40 MG tablet ?Commonly known as: PROTONIX ?TAKE 1 TABLET EVERY DAY (PLEASE REFILL WITH PRIMARY CARE PROVIDER FOR ANYMORE REFILLS) ?  ?potassium chloride 20 MEQ/15ML (10%) Soln ?Take 15 mLs (20 mEq total) by mouth daily. ?  ?Rivaroxaban 15 MG Tabs tablet ?Commonly known as: XARELTO ?Take 1 tablet (15 mg total) by mouth daily with breakfast. ?What changed:  ?medication strength ?See the new instructions. ?  ?spironolactone 25 MG tablet ?Commonly known as: ALDACTONE ?Take 1 tablet (25 mg total) by mouth daily. ?  ?travoprost (benzalkonium) 0.004 % ophthalmic solution ?Commonly known as: TRAVATAN ?Place 1 drop into both eyes at bedtime. ?  ?vitamin B-12 500 MCG tablet ?Commonly known as: CYANOCOBALAMIN ?Take 500 mcg by mouth daily. ?  ?VITAMIN D3 GUMMIES ADULT PO ?Take 1 tablet by mouth daily in the afternoon. ?  ? ?  ? ? ?Discharge Instructions: Please refer to Patient Instructions section of EMR for full details.  Patient was counseled important signs and symptoms that  should prompt return to medical care, changes in medications, dietary instructions, activity restrictions, and follow up appointments.  ? ?Follow-Up Appointments: ? ? ?France Ravens, MD ?11/08/2021, 8:19 AM ?PGY-1, [place] [facility] ?

## 2021-11-08 NOTE — Plan of Care (Signed)

## 2021-11-08 NOTE — Progress Notes (Signed)
0400- When patient arrived to unit, RN used interpretor to explain the use of the purewick and told patient that this unit does not use "diapers" as she was requesting. Patient was then bladder scanned for 45m. MD notified. Discussed obtaining an adult brief from another unit to test if patient was retaining due to waiting for a "diaper" or if patient was truly having urinary retention. After nursing staff put adult brief on patient while letting the pElkoin place, patient voided 6021m  ?

## 2021-11-08 NOTE — Progress Notes (Signed)
AVS explained to son.  ?

## 2021-11-08 NOTE — TOC Transition Note (Signed)
Transition of Care (TOC) - CM/SW Discharge Note ? ? ?Patient Details  ?Name: Crystal Ashley ?MRN: [id number] ?Date of Birth: [birth year] ? ?Transition of Care (TOC) CM/SW Contact:  ?Emeterio Reeve, LCSW ?Phone Number: ?11/08/2021, 11:17 AM ? ? ?Clinical Narrative:    ? ?Pt to discharge home with son. CSW confirmed address in the chart was correct. [facility] has been called. ? ? ?Final next level of care: Home/Self Care ?Barriers to Discharge: No Barriers Identified ? ? ?Patient Goals and CMS Choice ?  ?  ?  ? ?Discharge Placement ?  ?           ?Patient chooses bed at: Other - please specify in the comment section below: ([place] [phone]) ?Patient to be transferred to facility by: [facility] ?Name of family member notified: Son ?Patient and family notified of of transfer: 11/08/21 ? ?Discharge Plan and Services ?  ?  ?           ?  ?  ?  ?  ?  ?  ?  ?  ?  ?  ? ?Social Determinants of Health (SDOH) Interventions ?  ? ? ?Readmission Risk Interventions ? ?  07/26/2020  ? 12:28 PM 05/29/2020  ? 11:34 AM 02/08/2020  ?  4:18 PM  ?Readmission Risk Prevention Plan  ?Transportation Screening Complete Complete Complete  ?PCP or Specialist Appt within 5-7 Days   Complete  ?Home Care Screening   Complete  ?Medication Review (RN CM)   Complete  ?Medication Review Press photographer) Complete Complete   ?PCP or Specialist appointment within 3-5 days of discharge Complete Complete   ?[place] or Home Care Consult Complete Complete   ?SW Recovery Care/Counseling Consult Complete Complete   ?Palliative Care Screening Not Applicable Not Applicable   ?Skilled Nursing Facility Not Applicable Complete   ? ? ?Emeterio Reeve, LCSW ?Clinical Social Worker ? ? ? ?

## 2021-11-08 NOTE — Hospital Course (Addendum)
Crystal Ashley is a 76 y.o.female with a history of A-fib on Xarelto, diabetes, hypertension, acquired hypothyroidism, GERD, chronic anemia, glaucoma who was admitted to the [facility] Teaching Service at [facility] for AKI and positive COVID-19. Her hospital course is detailed below: ? ?COVID-19 ?Acute Encephalopathy/weakness ?Poor PO intake ?Patient initially presented with fever and tachycardia with lactic acid 2.5.  Code sepsis was initiated and urine culture/blood cultures were collected.  Patient received 1 dose of IV cefepime and IV vancomycin.  Patient was then found to be COVID-19 positive.  BNP 131.5.  Patient CBC, UA, TSH, troponin x2 unremarkable.  Head CT and chest x-ray showed no acute processes.  Patient was started on 5-day course of molnupiravir.  Patient's vital signs stable throughout hospitalization and did not have new oxygen requirement. ? ?AKI ?Patient's creatinine on admission was 1.26 from baseline of 0.89.  Based on history, this is likely due to poor p.o. intake for past 2 days prior to admission.  Patient was able to spontaneously urinate and with continuous LR, her creatinine went down to 1.05.  ? ?Atrial Fibrillation, rate controlled ?Patient was continued on metoprolol XL 25 mg daily for rate control.  Patient's Xarelto was decreased to 15 mg qd given patient's AKI and age. ? ?Other chronic conditions were medically managed with home medications and formulary alternatives as necessary (chronic anemia, GERD, hypertension, glaucoma, hypothyroidism) ? ?PCP Follow-up Recommendations: ?-BMP to assess Cr ?-Assess cough and if she requires supplemental oxygen ?-Assess for dementia ?

## 2021-11-10 ENCOUNTER — Other Ambulatory Visit: Payer: Self-pay

## 2021-11-10 ENCOUNTER — Ambulatory Visit: Payer: Medicare Other | Admitting: Family Medicine

## 2021-11-10 MED ORDER — BLOOD GLUCOSE TEST VI STRP: 1.0000 | ORAL_STRIP | Freq: Two times a day (BID) | 0 refills | Status: DC

## 2021-11-10 NOTE — Telephone Encounter (Signed)
Ok to place orders. ? ?chest pain ? ?

## 2021-11-10 NOTE — Telephone Encounter (Signed)
Orders placed and test strips sent to [facility] per pt request.  ?

## 2021-11-10 NOTE — Telephone Encounter (Signed)
Pt's son called in asking if the glucose meter he requested could be ordered along with the supplies. They would need to be ordered through [employer]. Please advise ?

## 2021-11-12 ENCOUNTER — Other Ambulatory Visit: Payer: Self-pay | Admitting: *Deleted

## 2021-11-12 ENCOUNTER — Other Ambulatory Visit: Payer: Self-pay

## 2021-11-12 DIAGNOSIS — E7800 Pure hypercholesterolemia, unspecified: Secondary | ICD-10-CM

## 2021-11-12 LAB — CULTURE, BLOOD (ROUTINE X 2)
Culture: NO GROWTH
Culture: NO GROWTH
Special Requests: ADEQUATE
Special Requests: ADEQUATE

## 2021-11-12 MED ORDER — BLOOD GLUCOSE METER KIT: PACK | 0 refills | Status: DC

## 2021-11-12 NOTE — Telephone Encounter (Signed)
Rx was sent to pharmacy per pt message and request  ?

## 2021-11-12 NOTE — Telephone Encounter (Signed)
Pt's son has stated the wrong order was sent in. He is asking for a One Touch Verico Reflect meter, test strips, and Lancets (One Touch Delica Plus) to [facility]. Please advise ?

## 2021-11-14 ENCOUNTER — Encounter: Payer: Self-pay | Admitting: Family Medicine

## 2021-11-14 ENCOUNTER — Ambulatory Visit (INDEPENDENT_AMBULATORY_CARE_PROVIDER_SITE_OTHER): Payer: Medicare Other | Admitting: Family Medicine

## 2021-11-14 VITALS — BP 120/81 | HR 90 | Temp 97.4°F | Ht 60.0 in | Wt 168.0 lb

## 2021-11-14 DIAGNOSIS — E1169 Type 2 diabetes mellitus with other specified complication: Secondary | ICD-10-CM | POA: Diagnosis not present

## 2021-11-14 DIAGNOSIS — E785 Hyperlipidemia, unspecified: Secondary | ICD-10-CM | POA: Diagnosis not present

## 2021-11-14 DIAGNOSIS — E1159 Type 2 diabetes mellitus with other circulatory complications: Secondary | ICD-10-CM | POA: Diagnosis not present

## 2021-11-14 DIAGNOSIS — I4821 Permanent atrial fibrillation: Secondary | ICD-10-CM | POA: Diagnosis not present

## 2021-11-14 DIAGNOSIS — E1165 Type 2 diabetes mellitus with hyperglycemia: Secondary | ICD-10-CM

## 2021-11-14 DIAGNOSIS — E039 Hypothyroidism, unspecified: Secondary | ICD-10-CM | POA: Diagnosis not present

## 2021-11-14 DIAGNOSIS — I152 Hypertension secondary to endocrine disorders: Secondary | ICD-10-CM

## 2021-11-14 LAB — POCT GLYCOSYLATED HEMOGLOBIN (HGB A1C): Hemoglobin A1C: 7.3 % — AB (ref 4.0–5.6)

## 2021-11-14 MED ORDER — GUAIFENESIN-DM 100-10 MG/5ML PO SYRP: 5.0000 mL | ORAL_SOLUTION | ORAL | 0 refills | Status: DC | PRN

## 2021-11-14 NOTE — Progress Notes (Signed)
? ? ?Chief Complaint:  ?Crystal Ashley is a 76 y.o. female who presents today for a TCM visit. ? ?Assessment/Plan:  ?New/Acute Problems: ?COVID ?Still having occasional cough but overall improving.  We will send in dextromethorphan cough syrup as Tessalon has not been effective.  Encourage frequent ambulation and deep inspiration.  Discussed natural course of illness and that cough may persist for several more weeks.  They will let me know if symptoms do not gradually improve or if she develops any new symptoms ? ?AKI ?Likely secondary to dehydration secondary to decreased p.o. intake and diarrhea.  We will recheck c-Met today. ? ?Chronic Problems Addressed Today: ?Permanent atrial fibrillation ([place]) ?Recently had dose of Xarelto change while in the hospital due to her AKI.  We will recheck c-Met today.  If GFR is back to normal we will likely go back to 20 mg daily.  Advised her to follow-up with cardiology soon.  She is rate controlled with metoprolol succinate 25 mg daily and tolerating well. ? ?Hyperlipidemia associated with type 2 diabetes mellitus ([place]) ?Doing well on Lipitor 10 mg daily. ? ?Hypothyroidism ?Doing well on Synthroid 75 mcg daily. ? ?Hypertension associated with diabetes ([place]) ?A1c stable at 7.3 on metformin 500 mg once daily.  We will recheck in 3 to 6 months. ? ? ?  ?Subjective:  ?HPI: ? ?Summary of Hospital admission: ?Reason for admission: AKI ?Date of admission: 11/07/2021 ?Date of discharge: 11/08/2021 ?Summary of Hospital course: Patient presented to the ED on 11/07/2021 with fever and tachycardia.  Code sepsis was initiated.  She received IV antibiotics.  COVID test was positive.  She was also found to have slat increasing creatinine to 1.26 from baseline of 0.9.  She was started on 5-day course of molnupiravir and discharged home the next day. ? ?Interim history:  ?She has been doing well since being home. Cough is improving but still persistent. They have been compliant with  medications without missed doses. No fever or chills. Diarrhea seems to be improving.  ? ?See A/p for status of chronic conditions.  ? ?ROS: Per HPI, otherwise a complete review of systems was negative.  ? ?PMH: ? ?The following were reviewed and entered/updated in epic: ?Past Medical History:  ?Diagnosis Date  ? Atrial fibrillation ([place])   ? Cancer [facility])   ? Cataract   ? Diabetes mellitus without complication ([place])   ? Fracture 2001  ? left ankle  ? Glaucoma   ? Hypertension   ? Thyroid disease   ? ?Patient Active Problem List  ? Diagnosis Date Noted  ? Chronic obstructive pulmonary disease with (acute) exacerbation ([place]) 05/12/2021  ? Permanent atrial fibrillation ([place]) 11/19/2020  ? Pulmonary edema 07/21/2020  ? Chronic blood loss anemia 07/21/2020  ? Controlled type 2 diabetes mellitus with hyperglycemia ([place]) 07/21/2020  ? Debility 07/21/2020  ? Fusion of spine, thoracolumbar region 02/26/2020  ? Fracture of vertebra due to osteoporosis ([place])   ? Multiple traumatic injuries 02/12/2020  ? Chronic combined systolic (congestive) and diastolic (congestive) heart failure (H[facility])   ? B12 deficiency 09/23/2018  ? Anemia 09/23/2018  ? History of cardioversion 04/26/2018  ? Hyperlipidemia associated with type 2 diabetes mellitus ([place]) 01/04/2017  ? Vitamin D deficiency 12/15/2016  ? Glaucoma   ? Hypertension associated with diabetes ([place]) 04/19/2014  ? Hypothyroidism 04/19/2014  ? History of endometrial cancer 03/30/2014  ? ?Past Surgical History:  ?Procedure Laterality Date  ? APPLICATION OF ROBOTIC ASSISTANCE FOR SPINAL PROCEDURE N/A 02/26/2020  ?  Procedure: APPLICATION OF ROBOTIC ASSISTANCE FOR SPINAL PROCEDURE;  Surgeon: [name], MD;  Location: [place];  Service: Neurosurgery;  Laterality: N/A;  ? CARDIOVERSION N/A 04/08/2018  ? Procedure: CARDIOVERSION;  Surgeon: Adrian Prows, MD;  Location: [place];  Service: Cardiovascular;  Laterality: N/A;  ? CARDIOVERSION N/A 05/22/2019  ? Procedure: CARDIOVERSION;   Surgeon: Adrian Prows, MD;  Location: [place];  Service: Cardiovascular;  Laterality: N/A;  ? FOOT SURGERY Left   ? HYSTEROSCOPY    ? POLYPECTOMY  ? LUMBAR PERCUTANEOUS PEDICLE SCREW 4 LEVEL N/A 02/26/2020  ? Procedure: Thoracic eight to thoracic twelve posterior percutaneous instrumentation with cement augmentation and bilateral medial facetectomy for decompression at Thoracic ten-eleven;  Surgeon: [name], MD;  Location: [facility];  Service: Neurosurgery;  Laterality: N/A;  ? ? ?Family History  ?Problem Relation Age of Onset  ? Diabetes Son   ? Healthy Mother   ? Healthy Father   ? ? ?Medications- Reconciled discharge and current medications in Epic.  ?Current Outpatient Medications  ?Medication Sig Dispense Refill  ? atorvastatin (LIPITOR) 10 MG tablet TAKE 1 TABLET EVERY DAY 90 tablet 3  ? blood glucose meter kit and supplies Check glucose 3 times daily or PRN E11.9 1 each 0  ? Cholecalciferol (VITAMIN D3 GUMMIES ADULT PO) Take 1 tablet by mouth daily in the afternoon.    ? docusate sodium (COLACE) 100 MG capsule TAKE 1 CAPSULE TWICE DAILY (Patient taking differently: 200 mg daily.) 180 capsule 2  ? dorzolamide-timolol (COSOPT) 22.3-6.8 MG/ML ophthalmic solution Place 1 drop into both eyes 2 (two) times daily.    ? furosemide (LASIX) 40 MG tablet Take 1 tablet (40 mg total) by mouth daily. Change to every other day after 10 Lbs weight loss  08/06/2020 90 tablet 3  ? Glucose Blood (BLOOD GLUCOSE TEST STRIPS) STRP 1 each by Other route 2 (two) times daily. USE ONE TEST STRIP TWICE DAILY TO CHECK BLOOD SUGAR ?E11.9 100 strip 0  ? guaiFENesin-dextromethorphan (ROBITUSSIN DM) 100-10 MG/5ML syrup Take 5 mLs by mouth every 4 (four) hours as needed for cough. 118 mL 0  ? levothyroxine (SYNTHROID) 75 MCG tablet TAKE 1 TABLET EVERY DAY BEFORE BREAKFAST 90 tablet 0  ? Menthol, Topical Analgesic, (BIOFREEZE) 4 % GEL Apply 1 application topically daily as needed (pain). Apply to right side rib cage and lateral thigh     ? metFORMIN (GLUCOPHAGE) 500 MG tablet Take 1 tablet (500 mg total) by mouth daily with breakfast. 90 tablet 3  ? metoprolol succinate (TOPROL XL) 25 MG 24 hr tablet Take 1 tablet (25 mg total) by mouth daily. 90 tablet 3  ? Multiple Vitamins-Minerals (MULTI-VITAMIN GUMMIES PO) Take 2 tablets by mouth daily.    ? pantoprazole (PROTONIX) 40 MG tablet TAKE 1 TABLET EVERY DAY (PLEASE REFILL WITH PRIMARY CARE PROVIDER FOR ANYMORE REFILLS) 90 tablet 0  ? potassium chloride 20 MEQ/15ML (10%) SOLN Take 15 mLs (20 mEq total) by mouth daily. 1350 mL 2  ? Rivaroxaban (XARELTO) 15 MG TABS tablet Take 1 tablet (15 mg total) by mouth daily with breakfast. 30 tablet 0  ? spironolactone (ALDACTONE) 25 MG tablet Take 1 tablet (25 mg total) by mouth daily. 90 tablet 3  ? travoprost, benzalkonium, (TRAVATAN) 0.004 % ophthalmic solution Place 1 drop into both eyes at bedtime.     ? vitamin B-12 (CYANOCOBALAMIN) 500 MCG tablet Take 500 mcg by mouth daily.     ? ?No current facility-administered medications for this visit.  ? ? ?  Allergies-reviewed and updated ?Allergies  ?Allergen Reactions  ? 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  Pt preference- No meat (beef/chicken/pork/turkey/etc.) with the exception of seafood.  ? Amiodarone   ?  Nausea  ? ? ?Social History  ? ?Socioeconomic History  ? Marital status: Widowed  ?  Spouse name: Not on file  ? Number of children: 1  ? Years of education: Not on file  ? Highest education level: Not on file  ?Occupational History  ? Not on file  ?Tobacco Use  ? Smoking status: Never  ? Smokeless tobacco: Never  ?Vaping Use  ? Vaping Use: Never used  ?Substance and Sexual Activity  ? Alcohol use: No  ? Drug use: Never  ? Sexual activity: Yes  ?Other Topics Concern  ? Not on file  ?Social History Narrative  ? Married -- husband retired cardiologist  ? Only one child  ? ?Social Determinants of Health  ? ?Financial Resource Strain: Low Risk   ? Difficulty of Paying Living Expenses: Not hard at all   ?Food Insecurity: No Food Insecurity  ? Worried About Charity fundraiser in the Last Year: Never true  ? Ran Out of Food in the Last Year: Never true  ?Transportation Needs: No Transportation Needs  ? Lack of Tra

## 2021-11-14 NOTE — Assessment & Plan Note (Signed)
Doing well on Synthroid 75 mcg daily. ?

## 2021-11-14 NOTE — Assessment & Plan Note (Signed)
Doing well on Lipitor 10 mg daily. ?

## 2021-11-14 NOTE — Addendum Note (Signed)
Addended by: Loura Back on: 11/14/2021 02:54 PM ? ? Modules accepted: Orders ? ?

## 2021-11-14 NOTE — Assessment & Plan Note (Signed)
A1c stable at 7.3 on metformin 500 mg once daily.  We will recheck in 3 to 6 months. ?

## 2021-11-14 NOTE — Patient Instructions (Signed)
It was very nice to see you today! ? ?I am glad you are feeling better.  Please take the cough medication as needed.  It will probably take a few more weeks for your cough to resolve. ? ?We will check blood work today.  We may need to go back to the 20 mg dose of Xarelto depending on results. ? ?I will see back in 3 to 6 months.  Come back to see me sooner if needed. ? ?Take care, ?Dr Jerline Pain ? ?PLEASE NOTE: ? ?If you had any lab tests please let us know if you have not heard back within a few days. You may see your results on mychart before we have a chance to review them but we will give you a call once they are reviewed by [place]. If we ordered any referrals today, please let us know if you have not heard from their office within the next week.  ? ?Please try these tips to maintain a healthy lifestyle: ? ?Eat at least 3 REAL meals and 1-2 snacks per day.  Aim for no more than 5 hours between eating.  If you eat breakfast, please do so within one hour of getting up.  ? ?Each meal should contain half fruits/vegetables, one quarter protein, and one quarter carbs (no bigger than a computer mouse) ? ?Cut down on sweet beverages. This includes juice, soda, and sweet tea.  ? ?Drink at least 1 glass of water with each meal and aim for at least 8 glasses per day ? ?Exercise at least 150 minutes every week.   ?

## 2021-11-14 NOTE — Assessment & Plan Note (Signed)
Recently had dose of Xarelto change while in the hospital due to her AKI.  We will recheck c-Met today.  If GFR is back to normal we will likely go back to 20 mg daily.  Advised her to follow-up with cardiology soon.  She is rate controlled with metoprolol succinate 25 mg daily and tolerating well. ?

## 2021-11-15 ENCOUNTER — Other Ambulatory Visit: Payer: Self-pay | Admitting: Internal Medicine

## 2021-11-15 LAB — CBC
HCT: 37.4 % (ref 35.0–45.0)
Hemoglobin: 12.3 g/dL (ref 11.7–15.5)
MCH: 30.3 pg (ref 27.0–33.0)
MCHC: 32.9 g/dL (ref 32.0–36.0)
MCV: 92.1 fL (ref 80.0–100.0)
MPV: 10.3 fL (ref 7.5–12.5)
Platelets: 336 10*3/uL (ref 140–400)
RBC: 4.06 10*6/uL (ref 3.80–5.10)
RDW: 14.8 % (ref 11.0–15.0)
WBC: 7.7 10*3/uL (ref 3.8–10.8)

## 2021-11-15 LAB — COMPREHENSIVE METABOLIC PANEL
AG Ratio: 1.1 (calc) (ref 1.0–2.5)
ALT: 10 U/L (ref 6–29)
AST: 18 U/L (ref 10–35)
Albumin: 3.4 g/dL — ABNORMAL LOW (ref 3.6–5.1)
Alkaline phosphatase (APISO): 93 U/L (ref 37–153)
BUN: 10 mg/dL (ref 7–25)
CO2: 25 mmol/L (ref 20–32)
Calcium: 8.5 mg/dL — ABNORMAL LOW (ref 8.6–10.4)
Chloride: 100 mmol/L (ref 98–110)
Creat: 0.89 mg/dL (ref 0.60–1.00)
Globulin: 3.1 g/dL (calc) (ref 1.9–3.7)
Glucose, Bld: 186 mg/dL — ABNORMAL HIGH (ref 65–99)
Potassium: 4 mmol/L (ref 3.5–5.3)
Sodium: 141 mmol/L (ref 135–146)
Total Bilirubin: 0.5 mg/dL (ref 0.2–1.2)
Total Protein: 6.5 g/dL (ref 6.1–8.1)

## 2021-11-18 NOTE — Progress Notes (Signed)
Please inform patient of the following: ? ?Labs are all stable. Kidney function is back to baseline. We can recheck in 3-6 months. ? ?Algis Greenhouse. Jerline Pain, MD ?11/18/2021 2:10 PM  ?

## 2021-11-19 ENCOUNTER — Encounter: Payer: Self-pay | Admitting: Family Medicine

## 2021-11-19 NOTE — Progress Notes (Deleted)
? ?Chronic Care Management ?Pharmacy Note ? ?11/19/2021 ?Name:  Crystal Ashley MRN:  [id number] DOB:  May 31, 1946 ? ?Summary: ?Initial PharmD visit - all meds reviewed.  Her cough is improving after completion of Zpak, still taking Tessalon.  No concerns with meds at this time - patient is doing well overall. ? ?Recommendations/Changes made from today's visit: ?Monitor glucose occasionally ?Patient is due for diabetic foot exam ? ?Plan: ?FU 6 months ? ? ?Subjective: ?Crystal Ashley is an 76 y.o. year old female who is a primary patient of Jerline Pain, Algis Greenhouse, MD.  The CCM team was consulted for assistance with disease management and care coordination needs.   ? ?Engaged with patient by telephone for initial visit in response to provider referral for pharmacy case management and/or care coordination services.  ? ?Consent to Services:  ?The patient was given the following information about Chronic Care Management services today, agreed to services, and gave verbal consent: 1. CCM service includes personalized support from designated clinical staff supervised by the primary care provider, including individualized plan of care and coordination with other care providers 2. 24/7 contact phone numbers for assistance for urgent and routine care needs. 3. Service will only be billed when office clinical staff spend 20 minutes or more in a month to coordinate care. 4. Only one practitioner may furnish and bill the service in a calendar month. 5.The patient may stop CCM services at any time (effective at the end of the month) by phone call to the office staff. 6. The patient will be responsible for cost sharing (co-pay) of up to 20% of the service fee (after annual deductible is met). Patient agreed to services and consent obtained. ? ?Patient Care Team: ?Vivi Barrack, MD as PCP - General (Family Medicine) ?[name], MD as PCP - Cardiology (Cardiology) ?[name], MD as Consulting Physician  (Ophthalmology) ?Adrian Prows, MD as Consulting Physician (Cardiology) ?Edythe Clarity, [facility] as Pharmacist (Pharmacist) ? ?Recent office visits:  ?11/14/21 Jerline Pain) - sent in [place] for cough. ?  ?Recent consult visits:  ?02/25/2021 OV (ophthalmology) [name], no further information available ?  ?11/19/2020 OV (cardiology) [name], MD; no medication changes indicated. ?  ?  ?Hospital visits:  ?None in previous 6 months ? ?Objective: ? ?Lab Results  ?Component Value Date  ? CREATININE 0.89 11/14/2021  ? BUN 10 11/14/2021  ? GFR 63.47 05/12/2021  ? GFRNONAA 55 (L) 11/08/2021  ? GFRAA >60 02/28/2020  ? NA 141 11/14/2021  ? K 4.0 11/14/2021  ? CALCIUM 8.5 (L) 11/14/2021  ? CO2 25 11/14/2021  ? GLUCOSE 186 (H) 11/14/2021  ? ? ?Lab Results  ?Component Value Date/Time  ? HGBA1C 7.3 (A) 11/14/2021 02:35 PM  ? HGBA1C 7.0 (H) 05/12/2021 09:59 AM  ? HGBA1C 6.8 (H) 01/21/2021 03:13 PM  ? HGBA1C 6.5 04/08/2015 12:00 AM  ? GFR 63.47 05/12/2021 09:59 AM  ? GFR 41.63 (L) 01/17/2019 03:28 PM  ? MICROALBUR <0.7 05/15/2021 10:53 AM  ? MICROALBUR <0.7 01/22/2021 11:31 AM  ?  ?Last diabetic Eye exam:  ?Lab Results  ?Component Value Date/Time  ? HMDIABEYEEXA No Retinopathy 02/25/2021 12:00 AM  ?  ?Last diabetic Foot exam: No results found for: HMDIABFOOTEX  ? ?Lab Results  ?Component Value Date  ? CHOL 156 05/12/2021  ? HDL 34.40 (L) 05/12/2021  ? [place] 80 01/21/2021  ? LDLDIRECT 95.0 05/12/2021  ? TRIG 203.0 (H) 05/12/2021  ? CHOLHDL 5 05/12/2021  ? ? ? ?  Latest Ref Rng & Units 11/14/2021  ?  2:54 PM 11/07/2021  ?  3:51 PM 05/12/2021  ?  9:59 AM  ?Hepatic Function  ?Total Protein 6.1 - 8.1 g/dL 6.5   6.6   6.5    ?Albumin 3.5 - 5.0 g/dL  3.1   3.6    ?AST 10 - 35 U/L '18   25   11    ' ?ALT 6 - 29 U/L '10   15   8    ' ?Alk Phosphatase 38 - 126 U/L  95   95    ?Total Bilirubin 0.2 - 1.2 mg/dL 0.5   0.7   0.4    ? ? ?Lab Results  ?Component Value Date/Time  ? TSH 0.981 11/07/2021 03:52 PM  ? TSH 1.89 05/12/2021 09:59  AM  ? TSH 2.86 01/21/2021 03:13 PM  ? FREET4 1.07 03/15/2018 01:17 PM  ? ? ? ?  Latest Ref Rng & Units 11/14/2021  ?  2:54 PM 11/08/2021  ?  1:14 AM 11/07/2021  ?  3:51 PM  ?CBC  ?WBC 3.8 - 10.8 Thousand/uL 7.7   6.3   7.6    ?Hemoglobin 11.7 - 15.5 g/dL 12.3   11.1   12.0    ?Hematocrit 35.0 - 45.0 % 37.4   34.5   37.8    ?Platelets 140 - 400 Thousand/uL 336   190   240    ? ? ?Lab Results  ?Component Value Date/Time  ? VD25OH 73.37 05/12/2021 09:59 AM  ? VD25OH 51.67 02/13/2020 05:48 AM  ? VD25OH 71.88 11/29/2019 09:44 AM  ? ? ?Clinical ASCVD: Yes  ?The 10-year ASCVD risk score (Arnett DK, et al., 2019) is: 32.2% ?  Values used to calculate the score: ?    Age: 26 years ?    Sex: Female ?    Is Non-Hispanic African American: No ?    Diabetic: Yes ?    Tobacco smoker: No ?    Systolic Blood Pressure: 694 mmHg ?    Is BP treated: Yes ?    HDL Cholesterol: 34.4 mg/dL ?    Total Cholesterol: 156 mg/dL   ? ? ?  05/26/2021  ?  1:56 PM 05/12/2021  ?  9:31 AM 01/21/2021  ?  2:25 PM  ?Depression screen PHQ 2/9  ?Decreased Interest 0 0 0  ?Down, Depressed, Hopeless 0 0 0  ?PHQ - 2 Score 0 0 0  ?  ? ?Social History  ? ?Tobacco Use  ?Smoking Status Never  ?Smokeless Tobacco Never  ? ?BP Readings from Last 3 Encounters:  ?11/14/21 120/81  ?11/08/21 139/79  ?06/13/21 120/84  ? ?Pulse Readings from Last 3 Encounters:  ?11/14/21 90  ?11/08/21 96  ?06/13/21 (!) 105  ? ?Wt Readings from Last 3 Encounters:  ?11/14/21 168 lb (76.2 kg)  ?11/07/21 168 lb 10.4 oz (76.5 kg)  ?06/02/21 171 lb (77.6 kg)  ? ?BMI Readings from Last 3 Encounters:  ?11/14/21 32.81 kg/m?  ?11/07/21 32.94 kg/m?  ?06/13/21 33.40 kg/m?  ? ? ?Assessment/Interventions: Review of patient past medical history, allergies, medications, health status, including review of consultants reports, laboratory and other test data, was performed as part of comprehensive evaluation and provision of chronic care management services.  ? ?SDOH:  (Social Determinants of Health) assessments  and interventions performed: Yes ? ?Financial Resource Strain: Low Risk   ? Difficulty of Paying Living Expenses: Not hard at all  ? ? ?SDOH Screenings  ? ?Alcohol Screen: Not  on file  ?Depression (PHQ2-9): Low Risk   ? PHQ-2 Score: 0  ?Financial Resource Strain: Low Risk   ? Difficulty of Paying Living Expenses: Not hard at all  ?Food Insecurity: No Food Insecurity  ? Worried About Charity fundraiser in the Last Year: Never true  ? Ran Out of Food in the Last Year: Never true  ?Housing: Low Risk   ? Last Housing Risk Score: 0  ?Physical Activity: Inactive  ? Days of Exercise per Week: 0 days  ? Minutes of Exercise per Session: 0 min  ?Social Connections: Socially Isolated  ? Frequency of Communication with Friends and Family: Never  ? Frequency of Social Gatherings with Friends and Family: Once a week  ? Attends Religious Services: Never  ? Active Member of Clubs or Organizations: No  ? Attends Archivist Meetings: Never  ? Marital Status: Widowed  ?Stress: No Stress Concern Present  ? Feeling of Stress : Not at all  ?Tobacco Use: Low Risk   ? Smoking Tobacco Use: Never  ? Smokeless Tobacco Use: Never  ? Passive Exposure: Not on file  ?Transportation Needs: No Transportation Needs  ? Lack of Transportation (Medical): No  ? Lack of Transportation (Non-Medical): No  ? ? ?CCM Care Plan ? ?Allergies  ?Allergen Reactions  ? 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  Pt preference- No meat (beef/chicken/pork/turkey/etc.) with the exception of seafood.  ? Amiodarone   ?  Nausea  ? ? ?Medications Reviewed Today   ? ? Reviewed by Verlon Setting, CMA (Certified Medical Assistant) on 11/14/21 at 68  Med List Status: <None>  ? ?Medication Order Taking? Sig Documenting Provider Last Dose Status Informant  ?atorvastatin (LIPITOR) 10 MG tablet [id number] Yes TAKE 1 TABLET EVERY DAY Vivi Barrack, MD Taking Active Child  ?benzonatate (TESSALON) 100 MG capsule [id number] No Take 1 capsule (100 mg total) by mouth 2  (two) times daily as needed for cough.  ?Patient not taking: Reported on 11/14/2021  ? [name], [place] Not Taking Consider Medication Status and Discontinue (Completed Course) Child  ?blood glucose meter kit and

## 2021-11-19 NOTE — Telephone Encounter (Signed)
Please see note and advise  

## 2021-11-20 ENCOUNTER — Other Ambulatory Visit: Payer: Self-pay | Admitting: *Deleted

## 2021-11-20 DIAGNOSIS — E7800 Pure hypercholesterolemia, unspecified: Secondary | ICD-10-CM

## 2021-11-20 MED ORDER — BENZONATATE 200 MG PO CAPS: 200.0000 mg | ORAL_CAPSULE | Freq: Two times a day (BID) | ORAL | 0 refills | Status: DC | PRN

## 2021-11-20 NOTE — Telephone Encounter (Signed)
I am not sure what the insurance will cover. We can try tessalon '200mg'$  bid prn cough if they wish. ? ?Algis Greenhouse. Jerline Pain, MD ?11/20/2021 1:48 PM  ? ?

## 2021-11-25 ENCOUNTER — Telehealth: Payer: Medicare Other

## 2021-11-29 ENCOUNTER — Encounter: Payer: Self-pay | Admitting: Internal Medicine

## 2021-11-30 DIAGNOSIS — Z20822 Contact with and (suspected) exposure to covid-19: Secondary | ICD-10-CM | POA: Diagnosis not present

## 2021-12-01 MED ORDER — FUROSEMIDE 40 MG PO TABS: ORAL_TABLET | ORAL | 0 refills | Status: DC

## 2021-12-19 DIAGNOSIS — Z20822 Contact with and (suspected) exposure to covid-19: Secondary | ICD-10-CM | POA: Diagnosis not present

## 2022-01-10 ENCOUNTER — Other Ambulatory Visit: Payer: Self-pay | Admitting: Family Medicine

## 2022-01-10 ENCOUNTER — Other Ambulatory Visit: Payer: Self-pay | Admitting: Internal Medicine

## 2022-01-10 DIAGNOSIS — E039 Hypothyroidism, unspecified: Secondary | ICD-10-CM

## 2022-01-24 ENCOUNTER — Other Ambulatory Visit: Payer: Self-pay | Admitting: Physician Assistant

## 2022-01-24 ENCOUNTER — Other Ambulatory Visit: Payer: Self-pay | Admitting: Internal Medicine

## 2022-02-03 ENCOUNTER — Encounter: Payer: Self-pay | Admitting: Family Medicine

## 2022-02-04 NOTE — Telephone Encounter (Signed)
Ok to [place] letter?  

## 2022-02-06 NOTE — Telephone Encounter (Signed)
Ok to write letter for excuse for jury duty.

## 2022-02-10 ENCOUNTER — Encounter: Payer: Self-pay | Admitting: Family Medicine

## 2022-02-10 NOTE — Telephone Encounter (Signed)
Son is calling back in regard for a status update on the letter.  Please give Crystal Ashley a call back at [phone].  Is requesting letter to be mailed.

## 2022-02-21 ENCOUNTER — Other Ambulatory Visit: Payer: Self-pay | Admitting: Internal Medicine

## 2022-03-18 ENCOUNTER — Encounter: Payer: Self-pay | Admitting: Internal Medicine

## 2022-03-18 ENCOUNTER — Ambulatory Visit (INDEPENDENT_AMBULATORY_CARE_PROVIDER_SITE_OTHER): Payer: Medicare Other | Admitting: Internal Medicine

## 2022-03-18 VITALS — BP 127/88 | HR 70 | Wt 155.6 lb

## 2022-03-18 DIAGNOSIS — I4821 Permanent atrial fibrillation: Secondary | ICD-10-CM

## 2022-03-18 DIAGNOSIS — E785 Hyperlipidemia, unspecified: Secondary | ICD-10-CM

## 2022-03-18 DIAGNOSIS — E119 Type 2 diabetes mellitus without complications: Secondary | ICD-10-CM

## 2022-03-18 DIAGNOSIS — E46 Unspecified protein-calorie malnutrition: Secondary | ICD-10-CM | POA: Diagnosis not present

## 2022-03-18 DIAGNOSIS — E1169 Type 2 diabetes mellitus with other specified complication: Secondary | ICD-10-CM | POA: Diagnosis not present

## 2022-03-18 DIAGNOSIS — I10 Essential (primary) hypertension: Secondary | ICD-10-CM | POA: Diagnosis not present

## 2022-03-18 NOTE — Patient Instructions (Signed)
Medication Instructions:  Your physician has recommended you make the following change in your medication:  STOP: atorvastatin (Lipitor)   *If you need a refill on your cardiac medications before your next appointment, please call your pharmacy*   Lab Work: TODAY: CMP If you have labs (blood work) drawn today and your tests are completely normal, you will receive your results only by: [place] (if you have MyChart) OR A paper copy in the mail If you have any lab test that is abnormal or we need to change your treatment, we will call you to review the results.   Testing/Procedures: NONE   Follow-Up: At [facility], you and your health needs are our priority.  As part of our continuing mission to provide you with exceptional heart care, we have created designated Provider Care Teams.  These Care Teams include your primary Cardiologist (physician) and Advanced Practice Providers (APPs -  Physician Assistants and Nurse Practitioners) who all work together to provide you with the care you need, when you need it.  We recommend signing up for the patient portal called "MyChart".  Sign up information is provided on this After Visit Summary.  MyChart is used to connect with patients for Virtual Visits (Telemedicine).  Patients are able to view lab/test results, encounter notes, upcoming appointments, etc.  Non-urgent messages can be sent to your provider as well.   To learn more about what you can do with MyChart, go to [email or web address].    Your next appointment:   1 year(s)  The format for your next appointment:   In Person  Provider:   [name], MD     Important Information About Sugar

## 2022-03-18 NOTE — Progress Notes (Signed)
Cardiology Office Note:    Date:  03/18/2022   ID:  Crystal Ashley, DOB 06/13/1946, MRN [id number]  PCP:  Parker, Caleb M, MD   [facility]  Cardiologist:   A , MD  Advanced Practice Provider:  No care team member to display Electrophysiologist:  None    CC: Follow up AF  History of Present Illness:    Crystal Ashley is a 76 y.o. 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 Seen 11/19/20 In interim of this visit, patient we had not made changes to her rate control strategy.  Hade cataract surgery with slight increase in heart rates post-operatively.  Seen 06/02/21. 2022: DOAC and conseverative GDMT (recovered EF from 2021).  Lost to follow up.  Patient notes that she is doing well.   Is fairly sedentary at baseline. She has been continuing to lose weight 84 kg -> 76 kg.  Family notes weight is down to 146. There are no interval hospital/ED visit.    No chest pain or pressure .  No SOB/DOE and no PND/Orthopnea.  No weight gain or leg swelling.  No palpitations or syncope .   Past Medical History:  Diagnosis Date   Atrial fibrillation (HCC)    Cancer (HCC)    Cataract    Diabetes mellitus without complication (HCC)    Fracture 2001   left ankle   Glaucoma    Hypertension    Thyroid disease     Past Surgical History:  Procedure Laterality Date   APPLICATION OF ROBOTIC ASSISTANCE FOR SPINAL PROCEDURE N/A 02/26/2020   Procedure: APPLICATION OF ROBOTIC ASSISTANCE FOR SPINAL PROCEDURE;  Surgeon: Thomas, Jonathan G, MD;  Location: [facility] OR;  Service: Neurosurgery;  Laterality: N/A;   CARDIOVERSION N/A 04/08/2018   Procedure: CARDIOVERSION;  Surgeon: Ganji, Jay, MD;  Location: [facility] ENDOSCOPY;  Service: Cardiovascular;  Laterality: N/A;   CARDIOVERSION N/A 05/22/2019   Procedure: CARDIOVERSION;  Surgeon: Ganji, Jay, MD;  Location: [facility]OS[facility];  Service: Cardiovascular;   Laterality: N/A;   FOOT SURGERY Left    HYSTEROSCOPY     POLYPECTOMY   LUMBAR PERCUTANEOUS PEDICLE SCREW 4 LEVEL N/A 02/26/2020   Procedure: Thoracic eight to thoracic twelve posterior percutaneous instrumentation with cement augmentation and bilateral medial facetectomy for decompression at Thoracic ten-eleven;  Surgeon: Thomas, Jonathan G, MD;  Location: [facility] OR;  Service: Neurosurgery;  Laterality: N/A;    Current Medications: Current Meds  Medication Sig   blood glucose meter kit and supplies Check glucose 3 times daily or PRN E11.9   Cholecalciferol (VITAMIN D3 GUMMIES ADULT PO) Take 1 tablet by mouth daily in the afternoon.   docusate sodium (COLACE) 100 MG capsule TAKE 1 CAPSULE TWICE DAILY (Patient taking differently: 200 mg daily.)   dorzolamide-timolol (COSOPT) 22.3-6.8 MG/ML ophthalmic solution Place 1 drop into both eyes 2 (two) times daily.   furosemide (LASIX) 40 MG tablet TAKE 1 TABLET EVERY DAY. CHANGE TO 1 TABLET EVERY OTHER DAY WHEN DOWN 10 POUNDS AS DIRECTED   Glucose Blood (BLOOD GLUCOSE TEST STRIPS) STRP 1 each by Other route 2 (two) times daily. USE ONE TEST STRIP TWICE DAILY TO CHECK BLOOD SUGAR E11.9   levothyroxine (SYNTHROID) 75 MCG tablet TAKE 1 TABLET EVERY DAY BEFORE BREAKFAST   Menthol, Topical Analgesic, (BIOFREEZE) 4 % GEL Apply 1 application topically daily as needed (pain). Apply to right side rib cage and lateral thigh   metFORMIN (GLUCOPHAGE) 500 MG   tablet Take 1 tablet (500 mg total) by mouth daily with breakfast.   metoprolol succinate (TOPROL-XL) 25 MG 24 hr tablet Take 1 tablet (25 mg total) by mouth daily. Please call [phone] to schedule an appointment for future refills. Thank you. 1st attempt.   Multiple Vitamins-Minerals (MULTI-VITAMIN GUMMIES PO) Take 2 tablets by mouth daily.   pantoprazole (PROTONIX) 40 MG tablet TAKE 1 TABLET EVERY DAY (PLEASE REFILL WITH PRIMARY CARE PROVIDER FOR ANYMORE REFILLS)   potassium chloride 20 MEQ/15ML (10%) SOLN  Take 15 mLs (20 mEq total) by mouth daily.   Rivaroxaban (XARELTO) 15 MG TABS tablet Take 1 tablet (15 mg total) by mouth daily with breakfast.   spironolactone (ALDACTONE) 25 MG tablet Take 1 tablet (25 mg total) by mouth daily. Please keep upcoming appointment for future refills. Thank you.   travoprost, benzalkonium, (TRAVATAN) 0.004 % ophthalmic solution Place 1 drop into both eyes at bedtime.    vitamin B-12 (CYANOCOBALAMIN) 500 MCG tablet Take 500 mcg by mouth daily.    [DISCONTINUED] atorvastatin (LIPITOR) 10 MG tablet TAKE 1 TABLET EVERY DAY     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   Only one child   Social Determinants of Health   Financial Resource Strain: [place]  (05/26/2021)   Overall Financial Resource Strain (CARDIA)    Difficulty of Paying Living Expenses: Not hard at all  Food Insecurity: No [place] (05/26/2021)   Hunger Vital Sign    Worried About Running Out of Food in the Last Year: Never true    [place] in the Last Year: Never true  Transportation Needs: No Transportation Needs (05/26/2021)   PRAPARE - Hydrologist (Medical): No    Lack of Transportation (Non-Medical): No  Physical Activity: Inactive (05/26/2021)   Exercise Vital Sign    Days of Exercise per Week: 0 days    Minutes of Exercise per Session: 0 min  Stress: No Stress Concern Present (05/26/2021)   [place]    Feeling of Stress : Not at all  Social Connections: Socially Isolated (05/26/2021)   Social Connection and Isolation Panel [NHANES]    Frequency of Communication with Friends and Family: Never    Frequency of Social Gatherings with Friends and Family: Once a week    Attends Religious Services: Never    Marine scientist or Organizations: No    Attends Archivist Meetings: Never    Marital Status: Widowed    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  Loves old [place] movies  Family History: The patient's family history includes Diabetes in her son; Healthy in her father and mother.  ROS:   Please see the history of present illness.     All  other systems reviewed and are negative.  EKGs/Labs/Other Studies Reviewed:    The following studies were reviewed today:  EKG:   06/02/21:  AF rate 104 low voltage limb lead 08/06/20: AF rate 95  Transthoracic Echocardiogram: Date:07/22/20 Results:  1. Left ventricular ejection fraction, by estimation, is 60 to 65%. The  left ventricle has normal function. Left ventricular endocardial border  not optimally defined to evaluate regional wall motion. Left ventricular  diastolic function could not be  evaluated.   2. Right ventricular systolic function is normal. The right ventricular  size is normal. There is mildly elevated pulmonary artery systolic  pressure. The estimated right ventricular systolic pressure is 25.9 mmHg.   3. The mitral valve is grossly normal. No evidence of mitral valve  regurgitation. No evidence of mitral stenosis.   4. The aortic valve is grossly normal. Aortic valve regurgitation is not  visualized. No aortic stenosis is present.   5. The inferior vena cava is dilated in size with <50% respiratory  variability, suggesting right atrial pressure of 15 mmHg.   NonCardiac CT: Date:07/21/2020 Results: Right Atrial  Enlargement Pulmonary Artery Dilation No significant CAC or Aortic Atherosclerosis  Recent Labs: 11/07/2021: B Natriuretic Peptide 131.5; TSH 0.981 11/14/2021: ALT 10; BUN 10; Creat 0.89; Hemoglobin 12.3; Platelets 336; Potassium 4.0; Sodium 141  Recent Lipid Panel    Component Value Date/Time   CHOL 156 05/12/2021 0959   TRIG 203.0 (H) 05/12/2021 0959   HDL 34.40 (L) 05/12/2021 0959   CHOLHDL 5 05/12/2021 0959   VLDL 40.6 (H) 05/12/2021 0959   LDLCALC 80 01/21/2021 1513   LDLDIRECT 95.0 05/12/2021 0959    Physical Exam:    VS:  BP 127/88   Pulse 70   Wt 155 lb 9.6 oz (70.6 kg) Comment: wheel chair  SpO2 98%   BMI 30.39 kg/m     Wt Readings from Last 3 Encounters:  03/18/22 155 lb 9.6 oz (70.6 kg)  11/14/21 168 lb (76.2 kg)  11/07/21 168 lb 10.4 oz (76.5 kg)    Gen: No distress   Neck: No JVD Cardiac: No Rubs or Gallops, no Murmur, IRIR cardia, +2 radial pulses Respiratory: Clear to auscultation bilaterally, norma effort, normal  respiratory rate GI: Soft, nontender, non-distended  MS: Trace edema;  moves all extremities Integument: Skin feels warm Neuro:  At time of evaluation, alert and oriented to person/place/time/situation  Psych: Normal affect, patient feels OK  ASSESSMENT:    1. Protein-calorie malnutrition, unspecified severity ([place])   2. Permanent atrial fibrillation ([place])   3. Diabetes mellitus with coincident hypertension ([place])   4. Hyperlipidemia associated with type 2 diabetes mellitus (HCC)     PLAN:    Permanent Atrial Fibrillation HLD with DM HLD with DM Protein Calorie Malnutrition - she went from 184-> 155 lbs since last visit - I worry that a portion of this it decreased appetite; she is largely sedentary with move of her day being watching [place] and spending time with family - we have discussed her over all [place] today: we will plane for conservative cardiac care without plans for procedures (no cardiac procedure indicated today) but  with using medical therapy as indicated; she is a DNR  - CHADSVASC=3. - continue DOAC - continue lasix 40 mg PO daily  - can stop using compression stockings as patient desires - continue MRA and BB - family would like to decrease pill burden - SDM- will stop atorvastatin 10 mg and plan for conservative therapy  -  discussed with son; would offer Poditary eval for diabetic foot - discussed with son Glucerna and made nutrition recs, will get CMP and offered nutrition consult  One year f/u- cardiac issues are well compensated  Time Spent Directly with Patient:   I have spent a total of 40 minutes with the patient reviewing notes, imaging, EKGs, labs and examining the patient as well as establishing an assessment and plan that was discussed personally with the patient.  > 50% of time was spent in direct patient care and family.      Medication Adjustments/Labs and Tests Ordered: Current medicines are reviewed at length with the patient today.  Concerns regarding medicines are outlined above.  Orders Placed This Encounter  Procedures   Comprehensive metabolic panel    No orders of the defined types were placed in this encounter.    Patient Instructions  Medication Instructions:  Your physician has recommended you make the following change in your medication:  STOP: atorvastatin (Lipitor)   *If you need a refill on your cardiac medications before your next appointment, please call your pharmacy*   Lab Work: TODAY: CMP If you have labs (blood work) drawn today and your tests are completely normal, you will receive your results only by: MyChart Message (if you have MyChart) OR A paper copy in the mail If you have any lab test that is abnormal or we need to change your treatment, we will call you to review the results.   Testing/Procedures: NONE   Follow-Up: At [facility], you and your health needs are our priority.  As part of our continuing mission to provide you with  exceptional heart care, we have created designated Provider Care Teams.  These Care Teams include your primary Cardiologist (physician) and Advanced Practice Providers (APPs -  Physician Assistants and Nurse Practitioners) who all work together to provide you with the care you need, when you need it.  We recommend signing up for the patient portal called "MyChart".  Sign up information is provided on this After Visit Summary.  MyChart is used to connect with patients for Virtual Visits (Telemedicine).  Patients are able to view lab/test results, encounter notes, upcoming appointments, etc.  Non-urgent messages can be sent to your provider as well.   To learn more about what you can do with MyChart, go to [email or web address].    Your next appointment:   1 year(s)  The format for your next appointment:   In Person  Provider:    A , MD     Important Information About Sugar         Signed,  A , MD  03/18/2022 4:34 PM    [facility] 

## 2022-03-19 LAB — COMPREHENSIVE METABOLIC PANEL
ALT: 9 IU/L (ref 0–32)
AST: 14 IU/L (ref 0–40)
Albumin/Globulin Ratio: 1.3 (ref 1.2–2.2)
Albumin: 4.1 g/dL (ref 3.8–4.8)
Alkaline Phosphatase: 129 IU/L — ABNORMAL HIGH (ref 44–121)
BUN/Creatinine Ratio: 17 (ref 12–28)
BUN: 16 mg/dL (ref 8–27)
Bilirubin Total: 0.4 mg/dL (ref 0.0–1.2)
CO2: 23 mmol/L (ref 20–29)
Calcium: 9.5 mg/dL (ref 8.7–10.3)
Chloride: 96 mmol/L (ref 96–106)
Creatinine, Ser: 0.96 mg/dL (ref 0.57–1.00)
Globulin, Total: 3.1 g/dL (ref 1.5–4.5)
Glucose: 162 mg/dL — ABNORMAL HIGH (ref 70–99)
Potassium: 4 mmol/L (ref 3.5–5.2)
Sodium: 137 mmol/L (ref 134–144)
Total Protein: 7.2 g/dL (ref 6.0–8.5)
eGFR: 61 mL/min/{1.73_m2} (ref >59)

## 2022-03-21 ENCOUNTER — Other Ambulatory Visit: Payer: Self-pay | Admitting: Internal Medicine

## 2022-03-27 DIAGNOSIS — H401134 Primary open-angle glaucoma, bilateral, indeterminate stage: Secondary | ICD-10-CM | POA: Diagnosis not present

## 2022-03-27 DIAGNOSIS — H26492 Other secondary cataract, left eye: Secondary | ICD-10-CM | POA: Diagnosis not present

## 2022-03-27 DIAGNOSIS — E119 Type 2 diabetes mellitus without complications: Secondary | ICD-10-CM | POA: Diagnosis not present

## 2022-03-27 LAB — HM DIABETES EYE EXAM

## 2022-04-02 ENCOUNTER — Encounter: Payer: Self-pay | Admitting: Family Medicine

## 2022-04-11 ENCOUNTER — Other Ambulatory Visit: Payer: Self-pay | Admitting: Physician Assistant

## 2022-04-25 ENCOUNTER — Other Ambulatory Visit: Payer: Self-pay | Admitting: Internal Medicine

## 2022-04-29 ENCOUNTER — Encounter: Payer: Self-pay | Admitting: Internal Medicine

## 2022-04-29 ENCOUNTER — Other Ambulatory Visit: Payer: Self-pay

## 2022-04-29 DIAGNOSIS — I4821 Permanent atrial fibrillation: Secondary | ICD-10-CM

## 2022-04-29 MED ORDER — RIVAROXABAN 20 MG PO TABS: 20.0000 mg | ORAL_TABLET | Freq: Every day | ORAL | 1 refills | Status: DC

## 2022-04-29 NOTE — Telephone Encounter (Signed)
Prescription refill request for Xarelto received.  Indication: Afib  Last office visit:03/18/22 (Chandrasekhar) Weight: 70.6kg Age: 76 Scr: 0.96 (03/18/22) CrCl: 70.23m/min  Per pt's medication list, pt is on Xarelto '15mg'$  daily. Per CKarren Cobble PharmD, pt should be taking '20mg'$  daily.   Called pt and spoke with pt's son, PDeno Etiennewho stated when his mom was hospitalized in the past they put her on '15mg'$  for a very short period of time and then changed back to '20mg'$ . Pt's son confirmed pt has been taking '20mg'$  tablet .   I changed dasoge on med list and sent in new prescription and refill to requested pharmacy.   Per MyChart message, pt also had cost concerns with Xarelto. I discussed medication cost with pt's son and other medication options or possibly patient assistance programs. Pt's son stated they do not want to change anything at this time. I made him aware if cost becomes an issue to reach back out to anticoagulation clinic for other options and assistance.

## 2022-04-29 NOTE — Telephone Encounter (Signed)
Prescription refill request for Xarelto received.  Indication: Afib  Last office visit:03/18/22 (Chandrasekhar) Weight: 70.6kg Age: 76 Scr: 0.96 (03/18/22) CrCl: 70.57m/min   Per pt's medication list, pt is on Xarelto '15mg'$  daily. Per CKarren Cobble PharmD, pt should be taking '20mg'$  daily.    Called pt and spoke with pt's son, PDeno Etiennewho stated when his mom was hospitalized in the past they put her on '15mg'$  for a very short period of time and then changed back to '20mg'$ . Pt's son confirmed pt has been taking '20mg'$  tablet .    I changed dasoge on med list and sent in new prescription and refill to requested pharmacy.    I also discussed medication cost with pt's son and other medication options or possibly patient assistance programs. Pt's son stated they do not want to change anything at this time. I made him aware if cost becomes an issue to reach back out to anticoagulation clinic for other options and assistance.

## 2022-05-05 ENCOUNTER — Telehealth: Payer: Self-pay | Admitting: Pharmacist

## 2022-05-05 NOTE — Progress Notes (Signed)
Chronic Care Management Pharmacy Assistant   Name: Crystal Ashley  MRN: [id number] DOB: 28-May-1946   Reason for Encounter: Diabetes Adherence Call    Recent office visits:  11/14/2021 OV (PCP) Vivi Barrack, MD; We will send in dextromethorphan cough syrup as Tessalon has not been effective, Recently had dose of Xarelto change while in the hospital due to her AKI.  We will recheck c-Met today.  If GFR is back to normal we will likely go back to 20 mg daily  Recent consult visits:  03/18/2022 OV (Cardiology) [name], MD; will stop atorvastatin 10 mg and plan for conservative therapy   Hospital visits:  ED to Hospital Admission due to COVID-19 Date of Admission: 11/07/2021 Date of Discharge 11/08/2021 -Patient was continued on metoprolol XL 25 mg daily for rate control.  Patient's Xarelto was decreased to 15 mg qd given patient's AKI and age.  Medications: Outpatient Encounter Medications as of 05/05/2022  Medication Sig   blood glucose meter kit and supplies Check glucose 3 times daily or PRN E11.9   Cholecalciferol (VITAMIN D3 GUMMIES ADULT PO) Take 1 tablet by mouth daily in the afternoon.   docusate sodium (COLACE) 100 MG capsule TAKE 1 CAPSULE TWICE DAILY   dorzolamide-timolol (COSOPT) 22.3-6.8 MG/ML ophthalmic solution Place 1 drop into both eyes 2 (two) times daily.   furosemide (LASIX) 40 MG tablet TAKE 1 TABLET EVERY DAY. CHANGE TO 1 TABLET EVERY OTHER DAY WHEN DOWN 10 POUNDS AS DIRECTED   Glucose Blood (BLOOD GLUCOSE TEST STRIPS) STRP 1 each by Other route 2 (two) times daily. USE ONE TEST STRIP TWICE DAILY TO CHECK BLOOD SUGAR E11.9   levothyroxine (SYNTHROID) 75 MCG tablet TAKE 1 TABLET EVERY DAY BEFORE BREAKFAST   Menthol, Topical Analgesic, (BIOFREEZE) 4 % GEL Apply 1 application topically daily as needed (pain). Apply to right side rib cage and lateral thigh   metFORMIN (GLUCOPHAGE) 500 MG tablet Take 1 tablet (500 mg total) by mouth daily with  breakfast.   metoprolol succinate (TOPROL-XL) 25 MG 24 hr tablet TAKE 1 TABLET EVERY DAY (CALL [phone] TO SCHEDULE APPOINTMENT FOR REFILLS)   Multiple Vitamins-Minerals (MULTI-VITAMIN GUMMIES PO) Take 2 tablets by mouth daily.   pantoprazole (PROTONIX) 40 MG tablet TAKE 1 TABLET EVERY DAY (PLEASE REFILL WITH PRIMARY CARE PROVIDER FOR ANYMORE REFILLS)   potassium chloride 20 MEQ/15ML (10%) SOLN Take 15 mLs (20 mEq total) by mouth daily.   Rivaroxaban (XARELTO) 20 MG TABS tablet Take 1 tablet (20 mg total) by mouth daily with breakfast.   spironolactone (ALDACTONE) 25 MG tablet Take 1 tablet (25 mg total) by mouth daily. Please keep upcoming appointment for future refills. Thank you.   travoprost, benzalkonium, (TRAVATAN) 0.004 % ophthalmic solution Place 1 drop into both eyes at bedtime.    vitamin B-12 (CYANOCOBALAMIN) 500 MCG tablet Take 500 mcg by mouth daily.    No facility-administered encounter medications on file as of 05/05/2022.   Recent Relevant Labs: Lab Results  Component Value Date/Time   HGBA1C 7.3 (A) 11/14/2021 02:35 PM   HGBA1C 7.0 (H) 05/12/2021 09:59 AM   HGBA1C 6.8 (H) 01/21/2021 03:13 PM   HGBA1C 6.5 04/08/2015 12:00 AM   MICROALBUR <0.7 05/15/2021 10:53 AM   MICROALBUR <0.7 01/22/2021 11:31 AM    Kidney Function Lab Results  Component Value Date/Time   CREATININE 0.96 03/18/2022 04:33 PM   CREATININE 0.89 11/14/2021 02:54 PM   CREATININE 1.05 (H) 11/08/2021 01:14 AM   CREATININE 0.91 06/21/2020  04:17 PM   GFR 63.47 05/12/2021 09:59 AM   GFRNONAA 55 (L) 11/08/2021 01:14 AM   GFRAA >60 02/28/2020 03:08 PM    Current antihyperglycemic regimen:  Metformin 500 mg daily  What recent interventions/DTPs have been made to improve glycemic control:  No recent interventions or DTPs.  Have there been any recent hospitalizations or ED visits since last visit with CPP? No  Patient denies hypoglycemic symptoms.  Patient denies hyperglycemic symptoms.  How often  are you checking your blood sugar? Patients son states the patient is not currently monitoring her blood sugars at home.  Are you checking your feet daily/regularly? Yes  Adherence Review: Is the patient currently on a STATIN medication? Yes Is the patient currently on ACE/ARB medication? No Does the patient have >5 day gap between last estimated fill dates? No   Care Gaps: Medicare Annual Wellness: Completed 05/26/2021 Ophthalmology Exam: Next due on 03/28/2023 Foot Exam: Overdue since 06/15/2019 Hemoglobin A1C: 7.3% on 11/14/2021 Fecal DNA (Cologuard): Next due on 05/30/2024  Future Appointments  Date Time Provider [place]  05/14/2022  1:20 PM Vivi Barrack, MD [facility]  06/08/2022  2:30 PM [facility] HEALTH COACH [facility]   Star Rating Drugs: Atorvastatin 10 mg last filled 04/11/2022 90 DS Metformin 500 mg last filled 12/13/2021 90 DS  April D Calhoun, [place] Pharmacist Assistant [phone]

## 2022-05-11 ENCOUNTER — Encounter: Payer: Self-pay | Admitting: *Deleted

## 2022-05-14 ENCOUNTER — Encounter: Payer: Self-pay | Admitting: Family Medicine

## 2022-05-14 ENCOUNTER — Ambulatory Visit (INDEPENDENT_AMBULATORY_CARE_PROVIDER_SITE_OTHER): Payer: Medicare Other | Admitting: Family Medicine

## 2022-05-14 VITALS — BP 120/85 | HR 110 | Temp 96.0°F

## 2022-05-14 DIAGNOSIS — E538 Deficiency of other specified B group vitamins: Secondary | ICD-10-CM

## 2022-05-14 DIAGNOSIS — E559 Vitamin D deficiency, unspecified: Secondary | ICD-10-CM | POA: Diagnosis not present

## 2022-05-14 DIAGNOSIS — B351 Tinea unguium: Secondary | ICD-10-CM | POA: Diagnosis not present

## 2022-05-14 DIAGNOSIS — E1169 Type 2 diabetes mellitus with other specified complication: Secondary | ICD-10-CM | POA: Diagnosis not present

## 2022-05-14 DIAGNOSIS — Z23 Encounter for immunization: Secondary | ICD-10-CM

## 2022-05-14 DIAGNOSIS — E785 Hyperlipidemia, unspecified: Secondary | ICD-10-CM | POA: Diagnosis not present

## 2022-05-14 DIAGNOSIS — E1165 Type 2 diabetes mellitus with hyperglycemia: Secondary | ICD-10-CM | POA: Diagnosis not present

## 2022-05-14 DIAGNOSIS — Z79899 Other long term (current) drug therapy: Secondary | ICD-10-CM

## 2022-05-14 LAB — LIPID PANEL
Cholesterol: 174 mg/dL (ref 0–200)
HDL: 38 mg/dL — ABNORMAL LOW (ref >39.00)
NonHDL: 135.84
Total CHOL/HDL Ratio: 5
Triglycerides: 306 mg/dL — ABNORMAL HIGH (ref 0.0–149.0)
VLDL: 61.2 mg/dL — ABNORMAL HIGH (ref 0.0–40.0)

## 2022-05-14 LAB — COMPREHENSIVE METABOLIC PANEL
ALT: 10 U/L (ref 0–35)
AST: 14 U/L (ref 0–37)
Albumin: 3.9 g/dL (ref 3.5–5.2)
Alkaline Phosphatase: 108 U/L (ref 39–117)
BUN: 16 mg/dL (ref 6–23)
CO2: 28 mEq/L (ref 19–32)
Calcium: 9.4 mg/dL (ref 8.4–10.5)
Chloride: 98 mEq/L (ref 96–112)
Creatinine, Ser: 0.95 mg/dL (ref 0.40–1.20)
GFR: 58.28 mL/min — ABNORMAL LOW (ref >60.00)
Glucose, Bld: 187 mg/dL — ABNORMAL HIGH (ref 70–99)
Potassium: 4.4 mEq/L (ref 3.5–5.1)
Sodium: 135 mEq/L (ref 135–145)
Total Bilirubin: 0.4 mg/dL (ref 0.2–1.2)
Total Protein: 7.6 g/dL (ref 6.0–8.3)

## 2022-05-14 LAB — IBC + FERRITIN
Ferritin: 19.8 ng/mL (ref 10.0–291.0)
Iron: 61 ug/dL (ref 42–145)
Saturation Ratios: 18.2 % — ABNORMAL LOW (ref 20.0–50.0)
TIBC: 336 ug/dL (ref 250.0–450.0)
Transferrin: 240 mg/dL (ref 212.0–360.0)

## 2022-05-14 LAB — VITAMIN D 25 HYDROXY (VIT D DEFICIENCY, FRACTURES): VITD: 85.47 ng/mL (ref 30.00–100.00)

## 2022-05-14 LAB — CBC
HCT: 41.7 % (ref 36.0–46.0)
Hemoglobin: 13.9 g/dL (ref 12.0–15.0)
MCHC: 33.2 g/dL (ref 30.0–36.0)
MCV: 91.6 fl (ref 78.0–100.0)
Platelets: 341 10*3/uL (ref 150.0–400.0)
RBC: 4.55 Mil/uL (ref 3.87–5.11)
RDW: 16.6 % — ABNORMAL HIGH (ref 11.5–15.5)
WBC: 10.5 10*3/uL (ref 4.0–10.5)

## 2022-05-14 LAB — HEMOGLOBIN A1C: Hgb A1c MFr Bld: 7.1 % — ABNORMAL HIGH (ref 4.6–6.5)

## 2022-05-14 LAB — TSH: TSH: 2.03 u[IU]/mL (ref 0.35–5.50)

## 2022-05-14 LAB — LDL CHOLESTEROL, DIRECT: Direct LDL: 111 mg/dL

## 2022-05-14 LAB — VITAMIN B12: Vitamin B-12: >1500 pg/mL — ABNORMAL HIGH (ref 211–911)

## 2022-05-14 NOTE — Assessment & Plan Note (Addendum)
We will check A1c.  They have been off metformin for a few weeks.  Okay with holding off on metformin for now depending on results of A1c though if A1c starts to increase we will likely need to restart this vs alternative.

## 2022-05-14 NOTE — Addendum Note (Signed)
Addended by: Betti Cruz on: 05/14/2022 01:59 PM   Modules accepted: Orders

## 2022-05-14 NOTE — Assessment & Plan Note (Signed)
Check B12 

## 2022-05-14 NOTE — Assessment & Plan Note (Signed)
Check Vitamin D.  

## 2022-05-14 NOTE — Assessment & Plan Note (Signed)
Check lipids.  She is on Lipitor 10 mg daily.

## 2022-05-14 NOTE — Progress Notes (Signed)
   Crystal Ashley is a 76 y.o. female who presents today for an office visit.  Assessment/Plan:  Chronic Problems Addressed Today: Controlled type 2 diabetes mellitus with hyperglycemia (HCC) We will check A1c.  They have been off metformin for a few weeks.  Okay with holding off on metformin for now depending on results of A1c though if A1c starts to increase we will likely need to restart this vs alternative.  Onychomycosis No red flags.  No signs of infection or ingrown toenail.  Not currently having any pain.  We discussed referral to podiatry for nail avulsion however she declined.  We will continue with watchful waiting.  B12 deficiency Check B12  Hyperlipidemia associated with type 2 diabetes mellitus (HCC) Check lipids.  She is on Lipitor 10 mg daily.  Vitamin D deficiency Check Vitamin D     Subjective:  HPI:  See A/p for status of chronic conditions. Since our last visit there was concern for decreased appetite. They stopped taking metformin due to concern it may be causing this. Sugars have been well controlled.  Appetite is improved since stopping the metformin.       Objective:  Physical Exam: BP 120/85   Pulse (!) 110   Temp (!) 96 F (35.6 C) (Temporal)   SpO2 97%   Gen: No acute distress, resting comfortably CV: Regular rate and rhythm with no murmurs appreciated Pulm: Normal work of breathing, clear to auscultation bilaterally with no crackles, wheezes, or rhonchi MSK: Onychomycosis noted Neuro: Grossly normal, moves all extremities Psych: Normal affect and thought content      Crystal Ashley M. Jerline Pain, MD 05/14/2022 1:42 PM

## 2022-05-14 NOTE — Assessment & Plan Note (Signed)
No red flags.  No signs of infection or ingrown toenail.  Not currently having any pain.  We discussed referral to podiatry for nail avulsion however she declined.  We will continue with watchful waiting.

## 2022-05-14 NOTE — Telephone Encounter (Signed)
Lab order today at [facility]

## 2022-05-14 NOTE — Patient Instructions (Signed)
It was very nice to see you today!  We will check blood work today.  We will give your flu shot and pneumonia shot today.  We will see you back in 6 months.  Come back sooner if needed.  Take care, Dr Jerline Pain  PLEASE NOTE:  If you had any lab tests please let us know if you have not heard back within a few days. You may see your results on mychart before we have a chance to review them but we will give you a call once they are reviewed by [place]. If we ordered any referrals today, please let us know if you have not heard from their office within the next week.   Please try these tips to maintain a healthy lifestyle:  Eat at least 3 REAL meals and 1-2 snacks per day.  Aim for no more than 5 hours between eating.  If you eat breakfast, please do so within one hour of getting up.   Each meal should contain half fruits/vegetables, one quarter protein, and one quarter carbs (no bigger than a computer mouse)  Cut down on sweet beverages. This includes juice, soda, and sweet tea.   Drink at least 1 glass of water with each meal and aim for at least 8 glasses per day  Exercise at least 150 minutes every week.

## 2022-05-15 ENCOUNTER — Ambulatory Visit: Payer: Medicare Other | Admitting: Family Medicine

## 2022-05-21 NOTE — Progress Notes (Signed)
Please inform patient of the following:  She is stable.  It is okay for her to stay off of metformin at this time.  Her LDL is a bit higher than I would like.  Recommend we increase her Lipitor to 20 mg daily.  Please send in new prescription if they are agreeable to start.  Everything else is stable.  We can recheck in 6 months.

## 2022-05-22 ENCOUNTER — Other Ambulatory Visit: Payer: Self-pay

## 2022-05-22 MED ORDER — ATORVASTATIN CALCIUM 20 MG PO TABS: 20.0000 mg | ORAL_TABLET | Freq: Every day | ORAL | 3 refills | Status: DC

## 2022-05-25 ENCOUNTER — Encounter: Payer: Self-pay | Admitting: Family Medicine

## 2022-05-27 ENCOUNTER — Other Ambulatory Visit: Payer: Self-pay

## 2022-05-27 MED ORDER — ATORVASTATIN CALCIUM 20 MG PO TABS: 20.0000 mg | ORAL_TABLET | Freq: Every day | ORAL | 3 refills | Status: DC

## 2022-06-08 ENCOUNTER — Ambulatory Visit: Payer: Medicare Other

## 2022-06-12 ENCOUNTER — Ambulatory Visit: Payer: Medicare Other

## 2022-06-19 ENCOUNTER — Ambulatory Visit: Payer: Medicare Other

## 2022-06-26 ENCOUNTER — Ambulatory Visit (INDEPENDENT_AMBULATORY_CARE_PROVIDER_SITE_OTHER): Payer: Medicare Other

## 2022-06-26 VITALS — Wt 155.0 lb

## 2022-06-26 DIAGNOSIS — Z0000 Encounter for general adult medical examination without abnormal findings: Secondary | ICD-10-CM | POA: Diagnosis not present

## 2022-06-26 NOTE — Progress Notes (Signed)
I connected with  Crystal Ashley on 06/26/22 by a audio enabled telemedicine application and verified that I am speaking with the correct person using two identifiers.along with son Dunnigan,Prashant   Patient Location: Home  Provider Location: Office/Clinic  I discussed the limitations of evaluation and management by telemedicine. The patient expressed understanding and agreed to proceed.   Subjective:   Crystal Ashley is a 76 y.o. female who presents for Medicare Annual (Subsequent) preventive examination.  Review of Systems     Cardiac Risk Factors include: advanced age (>18mn, >>28women);sedentary lifestyle;obesity (BMI >30kg/m2);dyslipidemia;diabetes mellitus     Objective:    Today's Vitals   06/26/22 1129  Weight: 155 lb (70.3 kg)   Body mass index is 30.27 kg/m.     06/26/2022   11:38 AM 11/07/2021    4:50 PM 05/26/2021    1:57 PM 07/21/2020    8:00 PM 07/21/2020   12:06 AM 05/26/2020   11:00 PM 05/26/2020   12:05 PM  Advanced Directives  Does Patient Have a Medical Advance Directive? Yes Yes Yes No No Yes Yes  Type of [place] will [place] will Healthcare Power of [place] Does patient want to make changes to medical advance directive? No - Patient declined No - Patient declined    No - Patient declined No - Guardian declined  Copy of [place] Chart? Yes - validated most recent copy scanned in chart (See row information)  Yes - validated most recent copy scanned in chart (See row information)      Would patient like information on creating a medical advance directive?    No - Patient declined       Current Medications (verified) Outpatient Encounter Medications as of 06/26/2022  Medication Sig   atorvastatin (LIPITOR) 20 MG tablet Take 1 tablet (20 mg total) by mouth daily.   blood glucose meter kit  and supplies Check glucose 3 times daily or PRN E11.9   Cholecalciferol (VITAMIN D3 GUMMIES ADULT PO) Take 1 tablet by mouth daily in the afternoon.   docusate sodium (COLACE) 100 MG capsule TAKE 1 CAPSULE TWICE DAILY   dorzolamide-timolol (COSOPT) 22.3-6.8 MG/ML ophthalmic solution Place 1 drop into both eyes 2 (two) times daily.   furosemide (LASIX) 40 MG tablet TAKE 1 TABLET EVERY DAY. CHANGE TO 1 TABLET EVERY OTHER DAY WHEN DOWN 10 POUNDS AS DIRECTED   Glucose Blood (BLOOD GLUCOSE TEST STRIPS) STRP 1 each by Other route 2 (two) times daily. USE ONE TEST STRIP TWICE DAILY TO CHECK BLOOD SUGAR E11.9   latanoprost (XALATAN) 0.005 % ophthalmic solution 1 drop at bedtime.   levothyroxine (SYNTHROID) 75 MCG tablet TAKE 1 TABLET EVERY DAY BEFORE BREAKFAST   Menthol, Topical Analgesic, (BIOFREEZE) 4 % GEL Apply 1 application topically daily as needed (pain). Apply to right side rib cage and lateral thigh   metoprolol succinate (TOPROL-XL) 25 MG 24 hr tablet TAKE 1 TABLET EVERY DAY (CALL [phone] SCHEDULE APPOINTMENT FOR REFILLS)   Multiple Vitamins-Minerals (MULTI-VITAMIN GUMMIES PO) Take 2 tablets by mouth daily.   pantoprazole (PROTONIX) 40 MG tablet TAKE 1 TABLET EVERY DAY (PLEASE REFILL WITH PRIMARY CARE PROVIDER FOR ANYMORE REFILLS)   Rivaroxaban (XARELTO) 20 MG TABS tablet Take 1 tablet (20 mg total) by mouth daily with breakfast.   spironolactone (ALDACTONE) 25 MG tablet Take 1 tablet (25 mg total) by mouth daily.  Please keep upcoming appointment for future refills. Thank you.   vitamin B-12 (CYANOCOBALAMIN) 500 MCG tablet Take 500 mcg by mouth daily.    potassium chloride 20 MEQ/15ML (10%) SOLN Take 15 mLs (20 mEq total) by mouth daily.   [DISCONTINUED] travoprost, benzalkonium, (TRAVATAN) 0.004 % ophthalmic solution Place 1 drop into both eyes at bedtime.    No facility-administered encounter medications on file as of 06/26/2022.    Allergies (verified) Meat [alpha-gal] and  Amiodarone   History: Past Medical History:  Diagnosis Date   Atrial fibrillation ([place])    Cancer ([employer])    Cataract    Diabetes mellitus without complication ([place])    Fracture 2001   left ankle   Glaucoma    Hypertension    Thyroid disease    Past Surgical History:  Procedure Laterality Date   APPLICATION OF ROBOTIC ASSISTANCE FOR SPINAL PROCEDURE N/A 02/26/2020   Procedure: APPLICATION OF ROBOTIC ASSISTANCE FOR SPINAL PROCEDURE;  Surgeon: [name], MD;  Location: [place];  Service: Neurosurgery;  Laterality: N/A;   CARDIOVERSION N/A 04/08/2018   Procedure: CARDIOVERSION;  Surgeon: Adrian Prows, MD;  Location: [facility] ENDOSCOPY;  Service: Cardiovascular;  Laterality: N/A;   CARDIOVERSION N/A 05/22/2019   Procedure: CARDIOVERSION;  Surgeon: Adrian Prows, MD;  Location: [place];  Service: Cardiovascular;  Laterality: N/A;   FOOT SURGERY Left    HYSTEROSCOPY     POLYPECTOMY   LUMBAR PERCUTANEOUS PEDICLE SCREW 4 LEVEL N/A 02/26/2020   Procedure: Thoracic eight to thoracic twelve posterior percutaneous instrumentation with cement augmentation and bilateral medial facetectomy for decompression at Thoracic ten-eleven;  Surgeon: [name], MD;  Location: [place];  Service: Neurosurgery;  Laterality: N/A;   Family History  Problem Relation Age of Onset   Diabetes Son    Healthy Mother    Healthy Father    Social History   Socioeconomic History   Marital status: Widowed    Spouse name: Not on file   Number of children: 1   Years of education: Not on file   Highest education level: Not on file  Occupational History   Not on file  Tobacco Use   Smoking status: Never   Smokeless tobacco: Never  Vaping Use   Vaping Use: Never used  Substance and Sexual Activity   Alcohol use: No   Drug use: Never   Sexual activity: Yes  Other Topics Concern   Not on file  Social History Narrative   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   Only one child   Social Determinants of  Health   Financial Resource Strain: [place]  (06/26/2022)   Overall Financial Resource Strain ([place])    Difficulty of Paying Living Expenses: Not hard at all  Food Insecurity: No Food Insecurity (06/26/2022)   Hunger Vital Sign    Worried About Running Out of Food in the Last Year: Never true    [place] in the Last Year: Never true  Transportation Needs: No Transportation Needs (06/26/2022)   PRAPARE - Hydrologist (Medical): No    Lack of Transportation (Non-Medical): No  Physical Activity: Inactive (06/26/2022)   Exercise Vital Sign    Days of Exercise per Week: 0 days    Minutes of Exercise per Session: 0 min  Stress: No Stress Concern Present (06/26/2022)   [place]    Feeling of Stress : Not at all  Social Connections: Socially Isolated (  06/26/2022)   Social Connection and Isolation Panel [NHANES]    Frequency of Communication with Friends and Family: Never    Frequency of Social Gatherings with Friends and Family: Once a week    Attends Religious Services: Never    Marine scientist or Organizations: No    Attends Archivist Meetings: Never    Marital Status: Widowed    Tobacco Counseling Counseling given: Not Answered   Clinical Intake:  Pre-visit preparation completed: Yes  Pain : No/denies pain     BMI - recorded: 30.27 Nutritional Status: BMI > 30  Obese Nutritional Risks: None Diabetes: Yes CBG done?: No Did pt. bring in CBG monitor from home?: No  How often do you need to have someone help you when you read instructions, pamphlets, or other written materials from your doctor or pharmacy?: 1 - Never  Diabetic?Nutrition Risk Assessment:  Has the patient had any N/V/D within the last 2 months?  Yes  Does the patient have any non-healing wounds?  No  Has the patient had any unintentional weight loss or weight gain?  No    Diabetes:  Is the patient diabetic?  Yes  If diabetic, was a CBG obtained today?  No  Did the patient bring in their glucometer from home?  No  How often do you monitor your CBG's? Monthly .   Financial Strains and Diabetes Management:  Are you having any financial strains with the device, your supplies or your medication? No .  Does the patient want to be seen by Chronic Care Management for management of their diabetes?  No  Would the patient like to be referred to a Nutritionist or for Diabetic Management?  No   Diabetic Exams:  Diabetic Eye Exam: Completed 03/27/22 Diabetic Foot Exam: Overdue, Pt has been advised about the importance in completing this exam. Pt is scheduled for diabetic foot exam on next appt .   Interpreter Needed?: No  Information entered by :: Charlott Rakes, LPN   Activities of Daily Living    06/26/2022   11:39 AM 11/08/2021   12:50 AM  In your present state of health, do you have any difficulty performing the following activities:  Hearing? 1 1  Comment HOH   Vision? 0 0  Difficulty concentrating or making decisions? 1 1  Comment son assist   Walking or climbing stairs? 1 1  Dressing or bathing? 1 1  Comment son assist   Doing errands, shopping? 1 1  Comment son Diplomatic Services operational officer and eating ? Y   Comment son assist   Using the Toilet? Y   Comment son assist   In the past six months, have you accidently leaked urine? N   Do you have problems with loss of bowel control? N   Managing your Medications? Y   Managing your Finances? Y   Housekeeping or managing your Housekeeping? Y   Comment son asiist     Patient Care Team: Vivi Barrack, MD as PCP - General (Family Medicine) [name], MD as PCP - Cardiology (Cardiology) [name], MD as Consulting Physician (Ophthalmology) Adrian Prows, MD as Consulting Physician (Cardiology) Edythe Clarity, [facility] as Pharmacist (Pharmacist)  Indicate any recent Medical  Services you may have received from other than [facility] providers in the past year (date may be approximate).     Assessment:   This is a routine wellness examination for Seferina.  Hearing/Vision screen Hearing Screening - Comments:: Pt HOH  Vision Screening - Comments:: Pt follows up with Dr Satira Sark for annual eye exams   Dietary issues and exercise activities discussed: Current Exercise Habits: The patient does not participate in regular exercise at present   Goals Addressed             This Visit's Progress    Patient Stated       None at this time        Depression Screen    06/26/2022   11:36 AM 05/14/2022    1:19 PM 05/26/2021    1:56 PM 05/12/2021    9:31 AM 01/21/2021    2:25 PM 11/29/2019    9:11 AM 06/14/2018    2:16 PM  PHQ 2/9 Scores  PHQ - 2 Score 0 0 0 0 0 0 0    Fall Risk    06/26/2022   11:38 AM 05/14/2022    1:19 PM 05/26/2021    1:58 PM 01/21/2021    2:25 PM 01/17/2019    2:57 PM  Fall Risk   Falls in the past year? 1 0 1 1 0  Number falls in past yr: 1 0 1 0 0  Injury with Fall? 0 0 1 1 0  Comment   right hip swelling    Risk for fall due to : Impaired balance/gait;Impaired mobility;History of fall(s) No Fall Risks Impaired balance/gait;Impaired mobility    Follow up Falls prevention discussed  Falls prevention discussed      FALL RISK PREVENTION PERTAINING TO THE HOME:  Any stairs in or around the home? Yes  If so, are there any without handrails? No  Home free of loose throw rugs in walkways, pet beds, electrical cords, etc? Yes  Adequate lighting in your home to reduce risk of falls? Yes   ASSISTIVE DEVICES UTILIZED TO PREVENT FALLS:  Life alert? Yes  Use of a cane, walker or w/c? Yes  Grab bars in the bathroom? Yes  Shower chair or bench in shower? Yes  Elevated toilet seat or a handicapped toilet? Yes   TIMED UP AND GO:  Was the test performed? No .   Cognitive Function:        Immunizations Immunization History  Administered  Date(s) Administered   Fluad Quad(high Dose 65+) 05/25/2019, 05/28/2020, 05/12/2021, 05/14/2022   Influenza, High Dose Seasonal PF 06/07/2017, 06/14/2018   PFIZER(Purple Top)SARS-COV-2 Vaccination 10/16/2019, 11/14/2019, 06/06/2020   PNEUMOCOCCAL CONJUGATE-20 05/14/2022    TDAP status: Due, Education has been provided regarding the importance of this vaccine. Advised may receive this vaccine at local pharmacy or Health Dept. Aware to provide a copy of the vaccination record if obtained from local pharmacy or Health Dept. Verbalized acceptance and understanding.  Flu Vaccine status: Up to date  Pneumococcal vaccine status: Up to date  Covid-19 vaccine status: Completed vaccines  Qualifies for Shingles Vaccine? Yes   Zostavax completed No   Shingrix Completed?: No.    Education has been provided regarding the importance of this vaccine. Patient has been advised to call insurance company to determine out of pocket expense if they have not yet received this vaccine. Advised may also receive vaccine at local pharmacy or Health Dept. Verbalized acceptance and understanding.  Screening Tests Health Maintenance  Topic Date Due   FOOT EXAM  06/15/2019   COVID-19 Vaccine (4 - Pfizer risk series) 08/01/2020   Diabetic kidney evaluation - Urine ACR  05/15/2022   Zoster Vaccines- Shingrix (1 of 2) 08/13/2022 (Originally 01/22/1965)   TETANUS/TDAP  11/15/2022 (  Originally 01/22/1965)   HEMOGLOBIN A1C  11/12/2022   OPHTHALMOLOGY EXAM  03/28/2023   Diabetic kidney evaluation - GFR measurement  05/15/2023   Medicare Annual Wellness (AWV)  06/27/2023   Pneumonia Vaccine 36+ Years old  Completed   INFLUENZA VACCINE  Completed   Hepatitis C Screening  Completed   HPV VACCINES  Aged Out   Fecal DNA (Cologuard)  Discontinued    Health Maintenance  Health Maintenance Due  Topic Date Due   FOOT EXAM  06/15/2019   COVID-19 Vaccine (4 - Pfizer risk series) 08/01/2020   Diabetic kidney evaluation - Urine  ACR  05/15/2022    Colorectal cancer screening: No longer required.   Mammogram status: No longer required due to age .    Additional Screening:  Hepatitis C Screening:  Completed 03/15/18  Vision Screening: Recommended annual ophthalmology exams for early detection of glaucoma and other disorders of the eye. Is the patient up to date with their annual eye exam?  Yes  Who is the provider or what is the name of the office in which the patient attends annual eye exams? Dr Satira Sark  If pt is not established with a provider, would they like to be referred to a provider to establish care? No .   Dental Screening: Recommended annual dental exams for proper oral hygiene  Community Resource Referral / Chronic Care Management: CRR required this visit?  No   CCM required this visit?  No      Plan:     I have personally reviewed and noted the following in the patient's chart:   Medical and social history Use of alcohol, tobacco or illicit drugs  Current medications and supplements including opioid prescriptions. Patient is not currently taking opioid prescriptions. Functional ability and status Nutritional status Physical activity Advanced directives List of other physicians Hospitalizations, surgeries, and ER visits in previous 12 months Vitals Screenings to include cognitive, depression, and falls Referrals and appointments  In addition, I have reviewed and discussed with patient certain preventive protocols, quality metrics, and best practice recommendations. A written personalized care plan for preventive services as well as general preventive health recommendations were provided to patient.     Willette Brace, LPN   [id number]   Nurse Notes: none

## 2022-06-26 NOTE — Patient Instructions (Signed)
Crystal Ashley , Thank you for taking time to come for your Medicare Wellness Visit. I appreciate your ongoing commitment to your health goals. Please review the following plan we discussed and let me know if I can assist you in the future.   These are the goals we discussed:  Goals      Monitor and Manage My Blood Sugar-Diabetes Type 2     Timeframe:  Long-Range Goal Priority:  High Start Date: 05/26/21                            Expected End Date:  11/24/21                     Follow Up Date 08/26/21    - check blood sugar at prescribed times - check blood sugar if I feel it is too high or too low - enter blood sugar readings and medication or insulin into daily log    Why is this important?   Checking your blood sugar at home helps to keep it from getting very high or very low.  Writing the results in a diary or log helps the doctor know how to care for you.  Your blood sugar log should have the time, date and the results.  Also, write down the amount of insulin or other medicine that you take.  Other information, like what you ate, exercise done and how you were feeling, will also be helpful.     Notes:      Patient Stated     Getting her stronger to be able to stand alone      Patient Stated     None at this time      Weight (lb) < 200 lb (90.7 kg)        This is a list of the screening recommended for you and due dates:  Health Maintenance  Topic Date Due   Complete foot exam   06/15/2019   COVID-19 Vaccine (4 - Pfizer risk series) 08/01/2020   Yearly kidney health urinalysis for diabetes  05/15/2022   Zoster (Shingles) Vaccine (1 of 2) 08/13/2022*   Tetanus Vaccine  11/15/2022*   Hemoglobin A1C  11/12/2022   Eye exam for diabetics  03/28/2023   Yearly kidney function blood test for diabetes  05/15/2023   Medicare Annual Wellness Visit  06/27/2023   Pneumonia Vaccine  Completed   Flu Shot  Completed   Hepatitis C Screening: USPSTF Recommendation to screen - Ages  18-79 yo.  Completed   HPV Vaccine  Aged Out   Cologuard (Stool DNA test)  Discontinued  *Topic was postponed. The date shown is not the original due date.    Advanced directives: copies in chart   Conditions/risks identified: none at this time   Next appointment: Follow up in one year for your annual wellness visit    Preventive Care 65 Years and Older, Female Preventive care refers to lifestyle choices and visits with your health care provider that can promote health and wellness. What does preventive care include? A yearly physical exam. This is also called an annual well check. Dental exams once or twice a year. Routine eye exams. Ask your health care provider how often you should have your eyes checked. Personal lifestyle choices, including: Daily care of your teeth and gums. Regular physical activity. Eating a healthy diet. Avoiding tobacco and drug use. Limiting alcohol use. Practicing safe sex.  Taking low-dose aspirin every day. Taking vitamin and mineral supplements as recommended by your health care provider. What happens during an annual well check? The services and screenings done by your health care provider during your annual well check will depend on your age, overall health, lifestyle risk factors, and family history of disease. Counseling  Your health care provider may ask you questions about your: Alcohol use. Tobacco use. Drug use. Emotional well-being. Home and relationship well-being. Sexual activity. Eating habits. History of falls. Memory and ability to understand (cognition). Work and work Statistician. Reproductive health. Screening  You may have the following tests or measurements: Height, weight, and BMI. Blood pressure. Lipid and cholesterol levels. These may be checked every 5 years, or more frequently if you are over 87 years old. Skin check. Lung cancer screening. You may have this screening every year starting at age 110 if you have a  30-pack-year history of smoking and currently smoke or have quit within the past 15 years. Fecal occult blood test (FOBT) of the stool. You may have this test every year starting at age 67. Flexible sigmoidoscopy or colonoscopy. You may have a sigmoidoscopy every 5 years or a colonoscopy every 10 years starting at age 75. Hepatitis C blood test. Hepatitis B blood test. Sexually transmitted disease (STD) testing. Diabetes screening. This is done by checking your blood sugar (glucose) after you have not eaten for a while (fasting). You may have this done every 1-3 years. Bone density scan. This is done to screen for osteoporosis. You may have this done starting at age 76. Mammogram. This may be done every 1-2 years. Talk to your health care provider about how often you should have regular mammograms. Talk with your health care provider about your test results, treatment options, and if necessary, the need for more tests. Vaccines  Your health care provider may recommend certain vaccines, such as: Influenza vaccine. This is recommended every year. Tetanus, diphtheria, and acellular pertussis (Tdap, Td) vaccine. You may need a Td booster every 10 years. Zoster vaccine. You may need this after age 38. Pneumococcal 13-valent conjugate (PCV13) vaccine. One dose is recommended after age 22. Pneumococcal polysaccharide (PPSV23) vaccine. One dose is recommended after age 83. Talk to your health care provider about which screenings and vaccines you need and how often you need them. This information is not intended to replace advice given to you by your health care provider. Make sure you discuss any questions you have with your health care provider. Document Released: 08/30/2015 Document Revised: 04/22/2016 Document Reviewed: 06/04/2015 Elsevier Interactive Patient Education  2017 [place] Prevention in the Home Falls can cause injuries. They can happen to people of all ages. There are many  things you can do to make your home safe and to help prevent falls. What can I do on the outside of my home? Regularly fix the edges of walkways and driveways and fix any cracks. Remove anything that might make you trip as you walk through a door, such as a raised step or threshold. Trim any bushes or trees on the path to your home. Use bright outdoor lighting. Clear any walking paths of anything that might make someone trip, such as rocks or tools. Regularly check to see if handrails are loose or broken. Make sure that both sides of any steps have handrails. Any raised decks and porches should have guardrails on the edges. Have any leaves, snow, or ice cleared regularly. Use sand or salt on walking  paths during winter. Clean up any spills in your garage right away. This includes oil or grease spills. What can I do in the bathroom? Use night lights. Install grab bars by the toilet and in the tub and shower. Do not use towel bars as grab bars. Use non-skid mats or decals in the tub or shower. If you need to sit down in the shower, use a plastic, non-slip stool. Keep the floor dry. Clean up any water that spills on the floor as soon as it happens. Remove soap buildup in the tub or shower regularly. Attach bath mats securely with double-sided non-slip rug tape. Do not have throw rugs and other things on the floor that can make you trip. What can I do in the bedroom? Use night lights. Make sure that you have a light by your bed that is easy to reach. Do not use any sheets or blankets that are too big for your bed. They should not hang down onto the floor. Have a firm chair that has side arms. You can use this for support while you get dressed. Do not have throw rugs and other things on the floor that can make you trip. What can I do in the kitchen? Clean up any spills right away. Avoid walking on wet floors. Keep items that you use a lot in easy-to-reach places. If you need to reach  something above you, use a strong step stool that has a grab bar. Keep electrical cords out of the way. Do not use floor polish or wax that makes floors slippery. If you must use wax, use non-skid floor wax. Do not have throw rugs and other things on the floor that can make you trip. What can I do with my stairs? Do not leave any items on the stairs. Make sure that there are handrails on both sides of the stairs and use them. Fix handrails that are broken or loose. Make sure that handrails are as long as the stairways. Check any carpeting to make sure that it is firmly attached to the stairs. Fix any carpet that is loose or worn. Avoid having throw rugs at the top or bottom of the stairs. If you do have throw rugs, attach them to the floor with carpet tape. Make sure that you have a light switch at the top of the stairs and the bottom of the stairs. If you do not have them, ask someone to add them for you. What else can I do to help prevent falls? Wear shoes that: Do not have high heels. Have rubber bottoms. Are comfortable and fit you well. Are closed at the toe. Do not wear sandals. If you use a stepladder: Make sure that it is fully opened. Do not climb a closed stepladder. Make sure that both sides of the stepladder are locked into place. Ask someone to hold it for you, if possible. Clearly mark and make sure that you can see: Any grab bars or handrails. First and last steps. Where the edge of each step is. Use tools that help you move around (mobility aids) if they are needed. These include: Canes. Walkers. Scooters. Crutches. Turn on the lights when you go into a dark area. Replace any light bulbs as soon as they burn out. Set up your furniture so you have a clear path. Avoid moving your furniture around. If any of your floors are uneven, fix them. If there are any pets around you, be aware of where they are. Review  your medicines with your doctor. Some medicines can make you  feel dizzy. This can increase your chance of falling. Ask your doctor what other things that you can do to help prevent falls. This information is not intended to replace advice given to you by your health care provider. Make sure you discuss any questions you have with your health care provider. Document Released: 05/30/2009 Document Revised: 01/09/2016 Document Reviewed: 09/07/2014 Elsevier Interactive Patient Education  2017 [employer].

## 2022-07-18 ENCOUNTER — Other Ambulatory Visit: Payer: Self-pay | Admitting: Family Medicine

## 2022-07-18 DIAGNOSIS — E039 Hypothyroidism, unspecified: Secondary | ICD-10-CM

## 2022-07-29 ENCOUNTER — Telehealth: Payer: Self-pay | Admitting: Pharmacist

## 2022-07-29 NOTE — Progress Notes (Signed)
Chronic Care Management Pharmacy Assistant   Name: Crystal Ashley  MRN: [id number] DOB: 1946/01/20   Reason for Encounter: General Adherence Call    Recent office visits:  05/14/2022 [facility] (PCP) Vivi Barrack, MD; It is okay for her to stay off of metformin at this time.   Recent consult visits:  03/18/2022 [facility] (Cardiology) [name], MD; SDM- will stop atorvastatin 10 mg and plan for conservative therapy.   Hospital visits:  None in previous 6 months  Medications: Outpatient Encounter Medications as of 07/29/2022  Medication Sig   atorvastatin (LIPITOR) 20 MG tablet Take 1 tablet (20 mg total) by mouth daily.   blood glucose meter kit and supplies Check glucose 3 times daily or PRN E11.9   Cholecalciferol (VITAMIN D3 GUMMIES ADULT PO) Take 1 tablet by mouth daily in the afternoon.   docusate sodium (COLACE) 100 MG capsule TAKE 1 CAPSULE TWICE DAILY   dorzolamide-timolol (COSOPT) 22.3-6.8 MG/ML ophthalmic solution Place 1 drop into both eyes 2 (two) times daily.   furosemide (LASIX) 40 MG tablet TAKE 1 TABLET EVERY DAY. CHANGE TO 1 TABLET EVERY OTHER DAY WHEN DOWN 10 POUNDS AS DIRECTED   Glucose Blood (BLOOD GLUCOSE TEST STRIPS) STRP 1 each by Other route 2 (two) times daily. USE ONE TEST STRIP TWICE DAILY TO CHECK BLOOD SUGAR E11.9   latanoprost (XALATAN) 0.005 % ophthalmic solution 1 drop at bedtime.   levothyroxine (SYNTHROID) 75 MCG tablet TAKE 1 TABLET EVERY DAY BEFORE BREAKFAST   Menthol, Topical Analgesic, (BIOFREEZE) 4 % GEL Apply 1 application topically daily as needed (pain). Apply to right side rib cage and lateral thigh   metoprolol succinate (TOPROL-XL) 25 MG 24 hr tablet TAKE 1 TABLET EVERY DAY (CALL [phone] TO SCHEDULE APPOINTMENT FOR REFILLS)   Multiple Vitamins-Minerals (MULTI-VITAMIN GUMMIES PO) Take 2 tablets by mouth daily.   pantoprazole (PROTONIX) 40 MG tablet TAKE 1 TABLET EVERY DAY (PLEASE REFILL WITH PRIMARY CARE PROVIDER FOR ANYMORE  REFILLS)   potassium chloride 20 MEQ/15ML (10%) SOLN Take 15 mLs (20 mEq total) by mouth daily.   Rivaroxaban (XARELTO) 20 MG TABS tablet Take 1 tablet (20 mg total) by mouth daily with breakfast.   spironolactone (ALDACTONE) 25 MG tablet Take 1 tablet (25 mg total) by mouth daily. Please keep upcoming appointment for future refills. Thank you.   vitamin B-12 (CYANOCOBALAMIN) 500 MCG tablet Take 500 mcg by mouth daily.    No facility-administered encounter medications on file as of 07/29/2022.     Contacted [facility] for [employer] Review Call   Chart Review:  Have there been any documented new, changed, or discontinued medications since last visit? Yes.  Has there been any documented recent hospitalizations or ED visits since last visit with Clinical Pharmacist? No Brief Summary: Metformin has been discontinued.   Adherence Review:  Does the Clinical Pharmacist Assistant have access to adherence rates? Yes Adherence rates for STAR metric medications: Atorvastatin 20 mg last filled 07/28/2022 90 DS Does the patient have >5 day gap between last estimated fill dates for any of the above medications or other medication gaps? No Reason for medication gaps. Do you receive your medications through PAP? No  If Yes, what is the status? Last received?   Disease State Questions:  Able to connect with Patient? Yes Did patient have any problems with their health recently? No Have you had any admissions or emergency room visits or worsening of your condition(s) since last visit? No Have you had  any visits with new specialists or providers since your last visit? No Have you had any new health care problem(s) since your last visit? No Have you run out of any of your medications since you last spoke with clinical pharmacist? No Are there any medications you are not taking as prescribed? No Are you having any issues or side effects with your medications? No Do you have any other health  concerns or questions you want to discuss with your Clinical Pharmacist before your next visit? No Are there any health concerns that you feel we can do a better job addressing? No Are you having any problems with any of the following since the last visit: (select all that apply)  None 12. Any falls since last visit? No 13. Any increased or uncontrolled pain since last visit? No  Patients son stats he was driving during the time of this call. He requested to schedule an appt at a later time.  Care Gaps: Medicare Annual Wellness: Completed 06/26/2022 Ophthalmology Exam: Next due on 03/28/2023 Hemoglobin A1C: 7.1% on 05/14/2022  Future Appointments  Date Time Provider [place]  11/12/2022  2:00 PM Vivi Barrack, MD [facility]   Star Rating Drugs: Atorvastatin 20 mg last filled 07/28/2022 90 DS  April D Calhoun, [place] Pharmacist Assistant [phone]

## 2022-08-01 ENCOUNTER — Other Ambulatory Visit: Payer: Self-pay | Admitting: Internal Medicine

## 2022-08-01 ENCOUNTER — Other Ambulatory Visit: Payer: Self-pay | Admitting: Family Medicine

## 2022-09-13 ENCOUNTER — Other Ambulatory Visit: Payer: Self-pay | Admitting: Internal Medicine

## 2022-09-22 DIAGNOSIS — H04123 Dry eye syndrome of bilateral lacrimal glands: Secondary | ICD-10-CM | POA: Diagnosis not present

## 2022-09-22 DIAGNOSIS — H401134 Primary open-angle glaucoma, bilateral, indeterminate stage: Secondary | ICD-10-CM | POA: Diagnosis not present

## 2022-10-10 ENCOUNTER — Other Ambulatory Visit: Payer: Self-pay | Admitting: Internal Medicine

## 2022-10-10 DIAGNOSIS — I4821 Permanent atrial fibrillation: Secondary | ICD-10-CM

## 2022-10-12 NOTE — Telephone Encounter (Signed)
Prescription refill request for Xarelto received.   Indication: afib  Last office visit: Gasper Sells 03/18/2022 Weight:70.3kg  Age: 77 yo  Scr: 0.95, 05/14/2022 CrCl: 69m/min   Refill sent.

## 2022-11-12 ENCOUNTER — Ambulatory Visit: Payer: Medicare Other | Admitting: Family Medicine

## 2022-11-17 ENCOUNTER — Encounter: Payer: Self-pay | Admitting: Family Medicine

## 2022-11-17 ENCOUNTER — Ambulatory Visit (INDEPENDENT_AMBULATORY_CARE_PROVIDER_SITE_OTHER): Payer: Medicare Other | Admitting: Family Medicine

## 2022-11-17 VITALS — BP 126/84 | HR 89 | Temp 97.7°F | Ht 60.0 in

## 2022-11-17 DIAGNOSIS — I4821 Permanent atrial fibrillation: Secondary | ICD-10-CM | POA: Diagnosis not present

## 2022-11-17 DIAGNOSIS — I152 Hypertension secondary to endocrine disorders: Secondary | ICD-10-CM

## 2022-11-17 DIAGNOSIS — E785 Hyperlipidemia, unspecified: Secondary | ICD-10-CM | POA: Diagnosis not present

## 2022-11-17 DIAGNOSIS — R5381 Other malaise: Secondary | ICD-10-CM

## 2022-11-17 DIAGNOSIS — E1159 Type 2 diabetes mellitus with other circulatory complications: Secondary | ICD-10-CM | POA: Diagnosis not present

## 2022-11-17 DIAGNOSIS — E1169 Type 2 diabetes mellitus with other specified complication: Secondary | ICD-10-CM | POA: Diagnosis not present

## 2022-11-17 DIAGNOSIS — E1165 Type 2 diabetes mellitus with hyperglycemia: Secondary | ICD-10-CM | POA: Diagnosis not present

## 2022-11-17 LAB — POCT GLYCOSYLATED HEMOGLOBIN (HGB A1C): Hemoglobin A1C: 7.9 % — AB (ref 4.0–5.6)

## 2022-11-17 NOTE — Patient Instructions (Signed)
It was very nice to see you today!  Your A1c today is up a little bit.  Please continue to work on cutting down on sugars and carbs.  Please stay as active as possible.    We will see you back in 6 months to recheck  Take care, Dr Jerline Pain  PLEASE NOTE:  If you had any lab tests, please let us know if you have not heard back within a few days. You may see your results on mychart before we have a chance to review them but we will give you a call once they are reviewed by [place].   If we ordered any referrals today, please let us know if you have not heard from their office within the next week.   If you had any urgent prescriptions sent in today, please check with the pharmacy within an hour of our visit to make sure the prescription was transmitted appropriately.   Please try these tips to maintain a healthy lifestyle:  Eat at least 3 REAL meals and 1-2 snacks per day.  Aim for no more than 5 hours between eating.  If you eat breakfast, please do so within one hour of getting up.   Each meal should contain half fruits/vegetables, one quarter protein, and one quarter carbs (no bigger than a computer mouse)  Cut down on sweet beverages. This includes juice, soda, and sweet tea.   Drink at least 1 glass of water with each meal and aim for at least 8 glasses per day  Exercise at least 150 minutes every week.

## 2022-11-17 NOTE — Assessment & Plan Note (Addendum)
She has had increasing weakness and debility for the last several months and needs assistance with most of her ADLs.  They have previously seen home health and are interested in restarting this.  Will place referral today. Would likely benefit from a trial of PT as well as home health aide.

## 2022-11-17 NOTE — Assessment & Plan Note (Signed)
Initially elevated however however at goal on recheck. Will continue metoprolol succinate 25 mg daily and spironolactone 25 mg daily per cardiology.

## 2022-11-17 NOTE — Assessment & Plan Note (Signed)
A1c up to 7.9.  Not currently on any medications.  We did discuss potentially restarting medications however we will hold off on for now.  They will continue to work on diet.  Will also be referring her to home health to help with some mobility which should hopefully help some with A1c as well.  They will come back in 6 months and we can recheck labs at that time.  If A1c is not improving we will need to restart metformin.  She has previously tolerated this well.

## 2022-11-17 NOTE — Assessment & Plan Note (Signed)
Follows with cardiology.  Anticoagulated on Xarelto.  Rate controlled on metoprolol succinate 25 mg daily.

## 2022-11-17 NOTE — Progress Notes (Signed)
   Crystal Ashley is a 77 y.o. female who presents today for an office visit.  Assessment/Plan:  Chronic Problems Addressed Today: Controlled type 2 diabetes mellitus with hyperglycemia (HCC) A1c up to 7.9.  Not currently on any medications.  We did discuss potentially restarting medications however we will hold off on for now.  They will continue to work on diet.  Will also be referring her to home health to help with some mobility which should hopefully help some with A1c as well.  They will come back in 6 months and we can recheck labs at that time.  If A1c is not improving we will need to restart metformin.  She has previously tolerated this well.  Hypertension associated with diabetes ([place]) Initially elevated however however at goal on recheck. Will continue metoprolol succinate 25 mg daily and spironolactone 25 mg daily per cardiology.  Debility She has had increasing weakness and debility for the last several months and needs assistance with most of her ADLs.  They have previously seen home health and are interested in restarting this.  Will place referral today. Would likely benefit from a trial of PT as well as home health aide.  Permanent atrial fibrillation ([place]) Follows with cardiology.  Anticoagulated on Xarelto.  Rate controlled on metoprolol succinate 25 mg daily.    Subjective:  HPI:  See A/p for status of chronic conditions.  She is here today with her son.  They are here to recheck A1c.  No acute concerns today.  They have noticed progressive weakness and debility.  She needs more assistance with transferring and ADLs she has in the past.  Son is currently providing most of her in-home care.  They saw home health PT a couple of years ago however this was stopped due to lack of improvement.       Objective:  Physical Exam: BP 126/84   Pulse 89   Temp 97.7 F (36.5 C) (Temporal)   Ht 5' (1.524 m)   SpO2 100%   BMI 30.27 kg/m   Gen: No acute distress, resting  comfortably CV: Irregular with no murmurs appreciated Pulm: Normal work of breathing, clear to auscultation bilaterally with no crackles, wheezes, or rhonchi Neuro: Grossly normal, moves all extremities Psych: Normal affect and thought content      Jakya Dovidio M. Jerline Pain, MD 11/17/2022 2:45 PM

## 2023-02-24 ENCOUNTER — Other Ambulatory Visit: Payer: Self-pay | Admitting: Family Medicine

## 2023-03-18 ENCOUNTER — Ambulatory Visit: Payer: Medicare Other | Admitting: Internal Medicine

## 2023-04-01 ENCOUNTER — Encounter: Payer: Self-pay | Admitting: Family Medicine

## 2023-04-01 NOTE — Telephone Encounter (Signed)
Please see patient request and advise.

## 2023-04-05 NOTE — Telephone Encounter (Signed)
Can we have them schedule an appointment to discuss if this is still an ongoing issue? Some of her medications do not come in liquid form.  Katina Degree. Jimmey Ralph, MD 04/05/2023 7:54 AM

## 2023-04-18 ENCOUNTER — Other Ambulatory Visit: Payer: Self-pay | Admitting: Internal Medicine

## 2023-04-18 DIAGNOSIS — I4821 Permanent atrial fibrillation: Secondary | ICD-10-CM

## 2023-04-20 NOTE — Telephone Encounter (Signed)
Prescription refill request for Xarelto received.  Indication:afib Last office visit:upcoming Weight:70.3  kg Age:77 Scr:0.95  9/23 CrCl:55.04  Prescription refilled

## 2023-05-15 ENCOUNTER — Encounter: Payer: Self-pay | Admitting: Family Medicine

## 2023-05-18 ENCOUNTER — Other Ambulatory Visit: Payer: Self-pay | Admitting: *Deleted

## 2023-05-18 DIAGNOSIS — L8990 Pressure ulcer of unspecified site, unspecified stage: Secondary | ICD-10-CM

## 2023-05-18 NOTE — Telephone Encounter (Signed)
Ok to refer to home health.  Katina Degree. Jimmey Ralph, MD 05/18/2023 10:03 AM

## 2023-05-18 NOTE — Telephone Encounter (Signed)
Home health referral placed

## 2023-05-20 ENCOUNTER — Ambulatory Visit: Payer: Medicare Other | Admitting: Family Medicine

## 2023-05-20 ENCOUNTER — Encounter: Payer: Self-pay | Admitting: Family Medicine

## 2023-05-20 VITALS — BP 130/80 | HR 92 | Temp 97.3°F | Ht 60.0 in

## 2023-05-20 DIAGNOSIS — E039 Hypothyroidism, unspecified: Secondary | ICD-10-CM | POA: Diagnosis not present

## 2023-05-20 DIAGNOSIS — I152 Hypertension secondary to endocrine disorders: Secondary | ICD-10-CM

## 2023-05-20 DIAGNOSIS — E785 Hyperlipidemia, unspecified: Secondary | ICD-10-CM

## 2023-05-20 DIAGNOSIS — E1169 Type 2 diabetes mellitus with other specified complication: Secondary | ICD-10-CM | POA: Diagnosis not present

## 2023-05-20 DIAGNOSIS — E1159 Type 2 diabetes mellitus with other circulatory complications: Secondary | ICD-10-CM

## 2023-05-20 DIAGNOSIS — R5381 Other malaise: Secondary | ICD-10-CM | POA: Diagnosis not present

## 2023-05-20 DIAGNOSIS — Z7984 Long term (current) use of oral hypoglycemic drugs: Secondary | ICD-10-CM | POA: Diagnosis not present

## 2023-05-20 DIAGNOSIS — E538 Deficiency of other specified B group vitamins: Secondary | ICD-10-CM

## 2023-05-20 DIAGNOSIS — Z23 Encounter for immunization: Secondary | ICD-10-CM

## 2023-05-20 LAB — POCT GLYCOSYLATED HEMOGLOBIN (HGB A1C): Hemoglobin A1C: 9.5 % — AB (ref 4.0–5.6)

## 2023-05-20 MED ORDER — METFORMIN HCL 500 MG/5ML PO SOLN: 500.0000 mg | Freq: Every day | ORAL | 0 refills | Status: DC

## 2023-05-20 NOTE — Assessment & Plan Note (Signed)
She has had increased knee weakness and debility for the last several months and now needs assistant with most of her ADLs.  They have previously worked with home health no done well with this.  They would like to restart.  Will place referral to home health today for PT as well as nursing to monitor for pressure injuries.

## 2023-05-20 NOTE — Progress Notes (Signed)
   Crystal Ashley is a 77 y.o. female who presents today for an office visit.  Assessment/Plan:  Chronic Problems Addressed Today: Debility She has had increased knee weakness and debility for the last several months and now needs assistant with most of her ADLs.  They have previously worked with home health no done well with this.  They would like to restart.  Will place referral to home health today for PT as well as nursing to monitor for pressure injuries.  T2DM (type 2 diabetes mellitus) (HCC) A1c 9.5.  Will restart metformin 500 mg daily.  Will send in liquid formulation as she is having some difficulty with swallowing pills.  She has been on this in the past does that well.  We discussed importance of limiting sweets, sugar, and carbohydrates.  Recheck A1c in 3 months.  Hyperlipidemia associated with type 2 diabetes mellitus (HCC) Check lipids.  She is on Lipitor 10 mg daily.  Hypertension associated with diabetes (HCC) Blood pressure at goal today on metoprolol succinate 25 mg daily and spironolactone 25 mg daily per cardiology.  Hypothyroidism Check TSH.  She is on Synthroid 75 mcg daily.  B12 deficiency Check B12.  Preventative health care - flu shot given today.    Subjective:  HPI:  See A/P for status of chronic conditions.  Patient is here today for follow-up.  Last seen here 6 months ago.  At that time A1c was 7.9.  She has been doing relatively well since her last visit.  She is here with her son today.  Son is worried about a couple of open sores on her buttocks.  He has been trying to use Volmax at home however this is not improving.  She has also had worsening debility and difficulty with ambulation.  He would like to have referral to home health.  They have had this before and did well with this.          Objective:  Physical Exam: BP 130/80   Pulse 92   Temp (!) 97.3 F (36.3 C) (Temporal)   Ht 5' (1.524 m)   SpO2 98%   BMI 30.27 kg/m   Gen: No  acute distress, resting comfortably CV: Irregular with no murmurs appreciated Pulm: Normal work of breathing, clear to auscultation bilaterally with no crackles, wheezes, or rhonchi Neuro: Grossly normal, moves all extremities Psych: Minimally verbal      Crystal Ashley M. Jimmey Ralph, MD 05/20/2023 2:46 PM

## 2023-05-20 NOTE — Assessment & Plan Note (Signed)
Blood pressure at goal today on metoprolol succinate 25 mg daily and spironolactone 25 mg daily per cardiology.

## 2023-05-20 NOTE — Assessment & Plan Note (Signed)
Check lipids.  She is on Lipitor 10 mg daily. 

## 2023-05-20 NOTE — Assessment & Plan Note (Signed)
Check TSH.  She is on Synthroid 75 mcg daily.

## 2023-05-20 NOTE — Assessment & Plan Note (Signed)
Check B12 

## 2023-05-20 NOTE — Patient Instructions (Signed)
It was very nice to see you today!  We need to restart metformin.  I will send this into the pharmacy.  Please try to limit sweets, sugar, and carbs.  I will refer for home health.  Will check blood work today.  We gave your flu shot today.  Return in about 3 months (around 08/20/2023) for Follow Up.   Take care, Dr Jimmey Ralph  PLEASE NOTE:  If you had any lab tests, please let us know if you have not heard back within a few days. You may see your results on mychart before we have a chance to review them but we will give you a call once they are reviewed by [place].   If we ordered any referrals today, please let us know if you have not heard from their office within the next week.   If you had any urgent prescriptions sent in today, please check with the pharmacy within an hour of our visit to make sure the prescription was transmitted appropriately.   Please try these tips to maintain a healthy lifestyle:  Eat at least 3 REAL meals and 1-2 snacks per day.  Aim for no more than 5 hours between eating.  If you eat breakfast, please do so within one hour of getting up.   Each meal should contain half fruits/vegetables, one quarter protein, and one quarter carbs (no bigger than a computer mouse)  Cut down on sweet beverages. This includes juice, soda, and sweet tea.   Drink at least 1 glass of water with each meal and aim for at least 8 glasses per day  Exercise at least 150 minutes every week.

## 2023-05-20 NOTE — Assessment & Plan Note (Signed)
A1c 9.5.  Will restart metformin 500 mg daily.  Will send in liquid formulation as she is having some difficulty with swallowing pills.  She has been on this in the past does that well.  We discussed importance of limiting sweets, sugar, and carbohydrates.  Recheck A1c in 3 months.

## 2023-05-21 DIAGNOSIS — E119 Type 2 diabetes mellitus without complications: Secondary | ICD-10-CM | POA: Diagnosis not present

## 2023-05-21 DIAGNOSIS — H26493 Other secondary cataract, bilateral: Secondary | ICD-10-CM | POA: Diagnosis not present

## 2023-05-21 DIAGNOSIS — H401134 Primary open-angle glaucoma, bilateral, indeterminate stage: Secondary | ICD-10-CM | POA: Diagnosis not present

## 2023-05-21 LAB — LIPID PANEL
Cholesterol: 151 mg/dL (ref 0–200)
HDL: 37.6 mg/dL — ABNORMAL LOW (ref >39.00)
LDL Cholesterol: 72 mg/dL (ref 0–99)
NonHDL: 113.22
Total CHOL/HDL Ratio: 4
Triglycerides: 207 mg/dL — ABNORMAL HIGH (ref 0.0–149.0)
VLDL: 41.4 mg/dL — ABNORMAL HIGH (ref 0.0–40.0)

## 2023-05-21 LAB — CBC
HCT: 41.1 % (ref 36.0–46.0)
Hemoglobin: 13.1 g/dL (ref 12.0–15.0)
MCHC: 31.9 g/dL (ref 30.0–36.0)
MCV: 94.2 fL (ref 78.0–100.0)
Platelets: 380 10*3/uL (ref 150.0–400.0)
RBC: 4.36 Mil/uL (ref 3.87–5.11)
RDW: 17.8 % — ABNORMAL HIGH (ref 11.5–15.5)
WBC: 12.7 10*3/uL — ABNORMAL HIGH (ref 4.0–10.5)

## 2023-05-21 LAB — COMPREHENSIVE METABOLIC PANEL
ALT: 27 U/L (ref 0–35)
AST: 31 U/L (ref 0–37)
Albumin: 3.6 g/dL (ref 3.5–5.2)
Alkaline Phosphatase: 159 U/L — ABNORMAL HIGH (ref 39–117)
BUN: 19 mg/dL (ref 6–23)
CO2: 28 meq/L (ref 19–32)
Calcium: 9.4 mg/dL (ref 8.4–10.5)
Chloride: 93 meq/L — ABNORMAL LOW (ref 96–112)
Creatinine, Ser: 1.09 mg/dL (ref 0.40–1.20)
GFR: 49.07 mL/min — ABNORMAL LOW (ref >60.00)
Glucose, Bld: 407 mg/dL — ABNORMAL HIGH (ref 70–99)
Potassium: 4.8 meq/L (ref 3.5–5.1)
Sodium: 133 meq/L — ABNORMAL LOW (ref 135–145)
Total Bilirubin: 0.6 mg/dL (ref 0.2–1.2)
Total Protein: 7.6 g/dL (ref 6.0–8.3)

## 2023-05-21 LAB — HM DIABETES EYE EXAM

## 2023-05-21 LAB — VITAMIN B12: Vitamin B-12: >1501 pg/mL — ABNORMAL HIGH (ref 211–911)

## 2023-05-21 LAB — TSH: TSH: 2.67 u[IU]/mL (ref 0.35–5.50)

## 2023-05-24 ENCOUNTER — Encounter: Payer: Self-pay | Admitting: Family Medicine

## 2023-05-24 ENCOUNTER — Telehealth: Payer: Self-pay | Admitting: Family Medicine

## 2023-05-24 DIAGNOSIS — I152 Hypertension secondary to endocrine disorders: Secondary | ICD-10-CM | POA: Diagnosis not present

## 2023-05-24 DIAGNOSIS — E1159 Type 2 diabetes mellitus with other circulatory complications: Secondary | ICD-10-CM | POA: Diagnosis not present

## 2023-05-24 DIAGNOSIS — L89312 Pressure ulcer of right buttock, stage 2: Secondary | ICD-10-CM | POA: Diagnosis not present

## 2023-05-24 DIAGNOSIS — E1169 Type 2 diabetes mellitus with other specified complication: Secondary | ICD-10-CM | POA: Diagnosis not present

## 2023-05-24 DIAGNOSIS — L89322 Pressure ulcer of left buttock, stage 2: Secondary | ICD-10-CM | POA: Diagnosis not present

## 2023-05-24 DIAGNOSIS — E039 Hypothyroidism, unspecified: Secondary | ICD-10-CM | POA: Diagnosis not present

## 2023-05-24 DIAGNOSIS — E785 Hyperlipidemia, unspecified: Secondary | ICD-10-CM | POA: Diagnosis not present

## 2023-05-24 DIAGNOSIS — Z9181 History of falling: Secondary | ICD-10-CM | POA: Diagnosis not present

## 2023-05-24 DIAGNOSIS — E538 Deficiency of other specified B group vitamins: Secondary | ICD-10-CM | POA: Diagnosis not present

## 2023-05-24 DIAGNOSIS — Z556 Problems related to health literacy: Secondary | ICD-10-CM | POA: Diagnosis not present

## 2023-05-24 DIAGNOSIS — Z7901 Long term (current) use of anticoagulants: Secondary | ICD-10-CM | POA: Diagnosis not present

## 2023-05-24 NOTE — Telephone Encounter (Signed)
Home Health Verbal Orders  Agency:  [facility]  Caller: [employer] and title  Requesting OT/ PT/ Skilled nursing/ Social Work/ Speech:    Reason for Request:  Received referral to evaluate and they did this morning - outcome is - PT 2 times a week for 2 weeks then 1 a week for 4 week. Wound care evaluation - clean with wound cleanser, apply xeroform gauze, cover with psalm, secure with tape or transparent dressing 4 wounds on her inner buttocks - 2 on either sides - 3 are open - they look like shear wounds. Asking for OT eval  Frequency:    HH needs F2F w/in last 30 days    [phone]

## 2023-05-24 NOTE — Progress Notes (Signed)
Her blood sugar is high.  We discussed this at her office visit.  Hopefully this will calm down as we are restarting her metformin.  I would like to see them back in 3 months to recheck her A1c.  Her cholesterol levels are at goal.  Her white blood cell count is a little elevated and her alkaline phosphatase is little elevated as well.  This is probably nothing to worry about but I would like for them to come back to recheck soon.  Please place future order for CBC with differential and complete metabolic panel.  I would like to have him come back in a couple of weeks to recheck this.  The rest of her labs are all stable and we can recheck in 6 to 12 months.

## 2023-05-26 ENCOUNTER — Other Ambulatory Visit: Payer: Self-pay | Admitting: *Deleted

## 2023-05-26 DIAGNOSIS — D729 Disorder of white blood cells, unspecified: Secondary | ICD-10-CM

## 2023-05-26 NOTE — Telephone Encounter (Signed)
Called Carleen at [phone]  VO given

## 2023-05-26 NOTE — Telephone Encounter (Signed)
Ok with me. Please place any necessary orders. 

## 2023-05-26 NOTE — Telephone Encounter (Signed)
Ok to give VO? 

## 2023-05-27 ENCOUNTER — Other Ambulatory Visit: Payer: Self-pay | Admitting: *Deleted

## 2023-05-27 MED ORDER — METFORMIN HCL 500 MG PO TABS: 500.0000 mg | ORAL_TABLET | Freq: Every day | ORAL | 3 refills | Status: DC

## 2023-05-27 NOTE — Progress Notes (Signed)
Ok to send in metformin 500 mg daily in pill form.  Katina Degree. Jimmey Ralph, MD 05/27/2023 10:15 AM

## 2023-05-28 DIAGNOSIS — E1169 Type 2 diabetes mellitus with other specified complication: Secondary | ICD-10-CM | POA: Diagnosis not present

## 2023-05-28 DIAGNOSIS — E785 Hyperlipidemia, unspecified: Secondary | ICD-10-CM | POA: Diagnosis not present

## 2023-05-28 DIAGNOSIS — L89312 Pressure ulcer of right buttock, stage 2: Secondary | ICD-10-CM | POA: Diagnosis not present

## 2023-05-28 DIAGNOSIS — I152 Hypertension secondary to endocrine disorders: Secondary | ICD-10-CM | POA: Diagnosis not present

## 2023-05-28 DIAGNOSIS — E1159 Type 2 diabetes mellitus with other circulatory complications: Secondary | ICD-10-CM | POA: Diagnosis not present

## 2023-05-28 DIAGNOSIS — L89322 Pressure ulcer of left buttock, stage 2: Secondary | ICD-10-CM | POA: Diagnosis not present

## 2023-05-29 ENCOUNTER — Other Ambulatory Visit: Payer: Self-pay | Admitting: Internal Medicine

## 2023-05-31 DIAGNOSIS — E1159 Type 2 diabetes mellitus with other circulatory complications: Secondary | ICD-10-CM | POA: Diagnosis not present

## 2023-05-31 DIAGNOSIS — I152 Hypertension secondary to endocrine disorders: Secondary | ICD-10-CM | POA: Diagnosis not present

## 2023-05-31 DIAGNOSIS — L89312 Pressure ulcer of right buttock, stage 2: Secondary | ICD-10-CM | POA: Diagnosis not present

## 2023-05-31 DIAGNOSIS — E1169 Type 2 diabetes mellitus with other specified complication: Secondary | ICD-10-CM | POA: Diagnosis not present

## 2023-05-31 DIAGNOSIS — L89322 Pressure ulcer of left buttock, stage 2: Secondary | ICD-10-CM | POA: Diagnosis not present

## 2023-05-31 DIAGNOSIS — E785 Hyperlipidemia, unspecified: Secondary | ICD-10-CM | POA: Diagnosis not present

## 2023-06-01 ENCOUNTER — Telehealth: Payer: Self-pay | Admitting: Family Medicine

## 2023-06-01 NOTE — Telephone Encounter (Signed)
Home Health Verbal Orders  Agency:  [facility]  Caller: Nino Glow and title ph# [phone] - PT  Requesting PT:    Reason for Request:  Wound on bottom - nursing is addressing that. PT is to work on Mobility.  Frequency:  1 x per week for 1, 2 x per week for 3 weeks, then 1 x per week for 3 weeks  HH needs F2F w/in last 30 days

## 2023-06-01 NOTE — Telephone Encounter (Signed)
Spoke with Liji at [phone] VO given

## 2023-06-01 NOTE — Telephone Encounter (Signed)
Ok to give VO?

## 2023-06-01 NOTE — Telephone Encounter (Signed)
Ok with me. Please place any necessary orders. 

## 2023-06-02 ENCOUNTER — Emergency Department (HOSPITAL_COMMUNITY): Payer: Medicare Other

## 2023-06-02 ENCOUNTER — Inpatient Hospital Stay (HOSPITAL_COMMUNITY)
Admission: EM | Admit: 2023-06-02 | Discharge: 2023-06-11 | DRG: 871 | Disposition: A | Discharge status: Home Health | Payer: Medicare Other | Attending: Internal Medicine | Admitting: Internal Medicine

## 2023-06-02 ENCOUNTER — Encounter (HOSPITAL_COMMUNITY): Payer: Self-pay

## 2023-06-02 ENCOUNTER — Other Ambulatory Visit: Payer: Self-pay

## 2023-06-02 DIAGNOSIS — E039 Hypothyroidism, unspecified: Secondary | ICD-10-CM | POA: Diagnosis present

## 2023-06-02 DIAGNOSIS — Z6834 Body mass index (BMI) 34.0-34.9, adult: Secondary | ICD-10-CM

## 2023-06-02 DIAGNOSIS — R1310 Dysphagia, unspecified: Secondary | ICD-10-CM | POA: Diagnosis present

## 2023-06-02 DIAGNOSIS — E785 Hyperlipidemia, unspecified: Secondary | ICD-10-CM | POA: Diagnosis present

## 2023-06-02 DIAGNOSIS — L0390 Cellulitis, unspecified: Secondary | ICD-10-CM | POA: Diagnosis not present

## 2023-06-02 DIAGNOSIS — G9389 Other specified disorders of brain: Secondary | ICD-10-CM | POA: Diagnosis not present

## 2023-06-02 DIAGNOSIS — I482 Chronic atrial fibrillation, unspecified: Secondary | ICD-10-CM | POA: Diagnosis not present

## 2023-06-02 DIAGNOSIS — Z7189 Other specified counseling: Secondary | ICD-10-CM | POA: Diagnosis not present

## 2023-06-02 DIAGNOSIS — Z515 Encounter for palliative care: Secondary | ICD-10-CM | POA: Diagnosis not present

## 2023-06-02 DIAGNOSIS — L03818 Cellulitis of other sites: Secondary | ICD-10-CM | POA: Diagnosis present

## 2023-06-02 DIAGNOSIS — E669 Obesity, unspecified: Secondary | ICD-10-CM | POA: Diagnosis not present

## 2023-06-02 DIAGNOSIS — Z66 Do not resuscitate: Secondary | ICD-10-CM | POA: Diagnosis present

## 2023-06-02 DIAGNOSIS — L89323 Pressure ulcer of left buttock, stage 3: Secondary | ICD-10-CM | POA: Diagnosis present

## 2023-06-02 DIAGNOSIS — I152 Hypertension secondary to endocrine disorders: Secondary | ICD-10-CM | POA: Diagnosis not present

## 2023-06-02 DIAGNOSIS — J690 Pneumonitis due to inhalation of food and vomit: Secondary | ICD-10-CM | POA: Diagnosis present

## 2023-06-02 DIAGNOSIS — L89213 Pressure ulcer of right hip, stage 3: Secondary | ICD-10-CM | POA: Diagnosis present

## 2023-06-02 DIAGNOSIS — Z23 Encounter for immunization: Secondary | ICD-10-CM

## 2023-06-02 DIAGNOSIS — D3501 Benign neoplasm of right adrenal gland: Secondary | ICD-10-CM | POA: Diagnosis not present

## 2023-06-02 DIAGNOSIS — R6510 Systemic inflammatory response syndrome (SIRS) of non-infectious origin without acute organ dysfunction: Secondary | ICD-10-CM

## 2023-06-02 DIAGNOSIS — Z888 Allergy status to other drugs, medicaments and biological substances status: Secondary | ICD-10-CM

## 2023-06-02 DIAGNOSIS — E860 Dehydration: Secondary | ICD-10-CM | POA: Diagnosis present

## 2023-06-02 DIAGNOSIS — L89313 Pressure ulcer of right buttock, stage 3: Secondary | ICD-10-CM | POA: Diagnosis present

## 2023-06-02 DIAGNOSIS — R4182 Altered mental status, unspecified: Secondary | ICD-10-CM | POA: Diagnosis not present

## 2023-06-02 DIAGNOSIS — G9341 Metabolic encephalopathy: Secondary | ICD-10-CM

## 2023-06-02 DIAGNOSIS — A419 Sepsis, unspecified organism: Secondary | ICD-10-CM | POA: Diagnosis not present

## 2023-06-02 DIAGNOSIS — Y848 Other medical procedures as the cause of abnormal reaction of the patient, or of later complication, without mention of misadventure at the time of the procedure: Secondary | ICD-10-CM | POA: Diagnosis not present

## 2023-06-02 DIAGNOSIS — R7989 Other specified abnormal findings of blood chemistry: Secondary | ICD-10-CM | POA: Diagnosis not present

## 2023-06-02 DIAGNOSIS — R0689 Other abnormalities of breathing: Secondary | ICD-10-CM | POA: Diagnosis not present

## 2023-06-02 DIAGNOSIS — Z7901 Long term (current) use of anticoagulants: Secondary | ICD-10-CM

## 2023-06-02 DIAGNOSIS — E875 Hyperkalemia: Secondary | ICD-10-CM | POA: Diagnosis not present

## 2023-06-02 DIAGNOSIS — Z79899 Other long term (current) drug therapy: Secondary | ICD-10-CM

## 2023-06-02 DIAGNOSIS — J449 Chronic obstructive pulmonary disease, unspecified: Secondary | ICD-10-CM | POA: Diagnosis not present

## 2023-06-02 DIAGNOSIS — E871 Hypo-osmolality and hyponatremia: Secondary | ICD-10-CM | POA: Diagnosis not present

## 2023-06-02 DIAGNOSIS — R918 Other nonspecific abnormal finding of lung field: Secondary | ICD-10-CM | POA: Diagnosis not present

## 2023-06-02 DIAGNOSIS — E1159 Type 2 diabetes mellitus with other circulatory complications: Secondary | ICD-10-CM | POA: Diagnosis present

## 2023-06-02 DIAGNOSIS — I6782 Cerebral ischemia: Secondary | ICD-10-CM | POA: Diagnosis not present

## 2023-06-02 DIAGNOSIS — R0602 Shortness of breath: Secondary | ICD-10-CM | POA: Diagnosis not present

## 2023-06-02 DIAGNOSIS — E1165 Type 2 diabetes mellitus with hyperglycemia: Secondary | ICD-10-CM | POA: Diagnosis present

## 2023-06-02 DIAGNOSIS — I4821 Permanent atrial fibrillation: Secondary | ICD-10-CM | POA: Diagnosis present

## 2023-06-02 DIAGNOSIS — K8020 Calculus of gallbladder without cholecystitis without obstruction: Secondary | ICD-10-CM | POA: Diagnosis not present

## 2023-06-02 DIAGNOSIS — I952 Hypotension due to drugs: Secondary | ICD-10-CM | POA: Diagnosis present

## 2023-06-02 DIAGNOSIS — R6520 Severe sepsis without septic shock: Secondary | ICD-10-CM | POA: Diagnosis present

## 2023-06-02 DIAGNOSIS — Z7984 Long term (current) use of oral hypoglycemic drugs: Secondary | ICD-10-CM

## 2023-06-02 DIAGNOSIS — R404 Transient alteration of awareness: Secondary | ICD-10-CM | POA: Diagnosis not present

## 2023-06-02 DIAGNOSIS — T8089XA Other complications following infusion, transfusion and therapeutic injection, initial encounter: Secondary | ICD-10-CM | POA: Diagnosis not present

## 2023-06-02 DIAGNOSIS — E872 Acidosis, unspecified: Secondary | ICD-10-CM | POA: Diagnosis present

## 2023-06-02 DIAGNOSIS — R739 Hyperglycemia, unspecified: Secondary | ICD-10-CM

## 2023-06-02 DIAGNOSIS — N179 Acute kidney failure, unspecified: Secondary | ICD-10-CM | POA: Diagnosis not present

## 2023-06-02 DIAGNOSIS — I10 Essential (primary) hypertension: Secondary | ICD-10-CM | POA: Diagnosis not present

## 2023-06-02 DIAGNOSIS — Z7401 Bed confinement status: Secondary | ICD-10-CM

## 2023-06-02 DIAGNOSIS — R000 Tachycardia, unspecified: Secondary | ICD-10-CM | POA: Diagnosis not present

## 2023-06-02 DIAGNOSIS — R9389 Abnormal findings on diagnostic imaging of other specified body structures: Secondary | ICD-10-CM | POA: Diagnosis not present

## 2023-06-02 DIAGNOSIS — D649 Anemia, unspecified: Secondary | ICD-10-CM | POA: Diagnosis not present

## 2023-06-02 DIAGNOSIS — I4891 Unspecified atrial fibrillation: Secondary | ICD-10-CM

## 2023-06-02 DIAGNOSIS — S31000A Unspecified open wound of lower back and pelvis without penetration into retroperitoneum, initial encounter: Secondary | ICD-10-CM

## 2023-06-02 DIAGNOSIS — E1169 Type 2 diabetes mellitus with other specified complication: Secondary | ICD-10-CM | POA: Diagnosis not present

## 2023-06-02 DIAGNOSIS — F03C0 Unspecified dementia, severe, without behavioral disturbance, psychotic disturbance, mood disturbance, and anxiety: Secondary | ICD-10-CM | POA: Diagnosis present

## 2023-06-02 DIAGNOSIS — R109 Unspecified abdominal pain: Secondary | ICD-10-CM | POA: Diagnosis not present

## 2023-06-02 DIAGNOSIS — Z91014 Allergy to mammalian meats: Secondary | ICD-10-CM

## 2023-06-02 DIAGNOSIS — I700 Atherosclerosis of aorta: Secondary | ICD-10-CM | POA: Diagnosis not present

## 2023-06-02 DIAGNOSIS — Z79890 Hormone replacement therapy (postmenopausal): Secondary | ICD-10-CM

## 2023-06-02 LAB — I-STAT CHEM 8, ED
BUN: 27 mg/dL — ABNORMAL HIGH (ref 8–23)
Calcium, Ion: 1.1 mmol/L — ABNORMAL LOW (ref 1.15–1.40)
Chloride: 93 mmol/L — ABNORMAL LOW (ref 98–111)
Creatinine, Ser: 1.2 mg/dL — ABNORMAL HIGH (ref 0.44–1.00)
Glucose, Bld: 381 mg/dL — ABNORMAL HIGH (ref 70–99)
HCT: 38 % (ref 36.0–46.0)
Hemoglobin: 12.9 g/dL (ref 12.0–15.0)
Potassium: 5.2 mmol/L — ABNORMAL HIGH (ref 3.5–5.1)
Sodium: 130 mmol/L — ABNORMAL LOW (ref 135–145)
TCO2: 27 mmol/L (ref 22–32)

## 2023-06-02 LAB — I-STAT CG4 LACTIC ACID, ED
Lactic Acid, Venous: 3.8 mmol/L (ref 0.5–1.9)
Lactic Acid, Venous: 3.1 mmol/L (ref 0.5–1.9)

## 2023-06-02 LAB — CBC
HCT: 37.3 % (ref 36.0–46.0)
Hemoglobin: 11.9 g/dL — ABNORMAL LOW (ref 12.0–15.0)
MCH: 30 pg (ref 26.0–34.0)
MCHC: 31.9 g/dL (ref 30.0–36.0)
MCV: 94 fL (ref 80.0–100.0)
Platelets: 393 10*3/uL (ref 150–400)
RBC: 3.97 MIL/uL (ref 3.87–5.11)
RDW: 16.3 % — ABNORMAL HIGH (ref 11.5–15.5)
WBC: 16.9 10*3/uL — ABNORMAL HIGH (ref 4.0–10.5)
nRBC: 0 % (ref 0.0–0.2)

## 2023-06-02 LAB — URINALYSIS, ROUTINE W REFLEX MICROSCOPIC
Bilirubin Urine: NEGATIVE
Glucose, UA: 150 mg/dL — AB
Hgb urine dipstick: NEGATIVE
Ketones, ur: NEGATIVE mg/dL
Leukocytes,Ua: NEGATIVE
Nitrite: NEGATIVE
Protein, ur: NEGATIVE mg/dL
Specific Gravity, Urine: 1.019 (ref 1.005–1.030)
pH: 6 (ref 5.0–8.0)

## 2023-06-02 LAB — COMPREHENSIVE METABOLIC PANEL
ALT: 26 U/L (ref 0–44)
AST: 25 U/L (ref 15–41)
Albumin: 3 g/dL — ABNORMAL LOW (ref 3.5–5.0)
Alkaline Phosphatase: 136 U/L — ABNORMAL HIGH (ref 38–126)
Anion gap: 15 (ref 5–15)
BUN: 26 mg/dL — ABNORMAL HIGH (ref 8–23)
CO2: 25 mmol/L (ref 22–32)
Calcium: 9.1 mg/dL (ref 8.9–10.3)
Chloride: 92 mmol/L — ABNORMAL LOW (ref 98–111)
Creatinine, Ser: 1.36 mg/dL — ABNORMAL HIGH (ref 0.44–1.00)
GFR, Estimated: 40 mL/min — ABNORMAL LOW (ref >60)
Glucose, Bld: 375 mg/dL — ABNORMAL HIGH (ref 70–99)
Potassium: 5.3 mmol/L — ABNORMAL HIGH (ref 3.5–5.1)
Sodium: 132 mmol/L — ABNORMAL LOW (ref 135–145)
Total Bilirubin: 0.9 mg/dL (ref 0.3–1.2)
Total Protein: 7.2 g/dL (ref 6.5–8.1)

## 2023-06-02 LAB — CBG MONITORING, ED: Glucose-Capillary: 290 mg/dL — ABNORMAL HIGH (ref 70–99)

## 2023-06-02 LAB — TSH: TSH: 2.9 u[IU]/mL (ref 0.350–4.500)

## 2023-06-02 LAB — LIPASE, BLOOD: Lipase: 24 U/L (ref 11–51)

## 2023-06-02 LAB — T4, FREE: Free T4: 1.55 ng/dL — ABNORMAL HIGH (ref 0.61–1.12)

## 2023-06-02 LAB — LACTIC ACID, PLASMA: Lactic Acid, Venous: 1.7 mmol/L (ref 0.5–1.9)

## 2023-06-02 LAB — OSMOLALITY: Osmolality: 304 mosm/kg — ABNORMAL HIGH (ref 275–295)

## 2023-06-02 LAB — BRAIN NATRIURETIC PEPTIDE: B Natriuretic Peptide: 88.5 pg/mL (ref 0.0–100.0)

## 2023-06-02 LAB — AMMONIA: Ammonia: 27 umol/L (ref 9–35)

## 2023-06-02 MED ORDER — RIVAROXABAN 20 MG PO TABS: 20.0000 mg | ORAL_TABLET | Freq: Every day | ORAL | Status: DC

## 2023-06-02 MED ORDER — SODIUM CHLORIDE 0.9 % IV SOLN
1.0000 g | INTRAVENOUS | Status: DC
  Administered 2023-06-03: 1 g via INTRAVENOUS
  Filled 2023-06-02: qty 10

## 2023-06-02 MED ORDER — METOPROLOL TARTRATE 5 MG/5ML IV SOLN
1.2500 mg | Freq: Four times a day (QID) | INTRAVENOUS | Status: DC
  Administered 2023-06-03 – 2023-06-04 (×6): 1.3 mg via INTRAVENOUS
  Filled 2023-06-02 (×6): qty 5

## 2023-06-02 MED ORDER — SODIUM ZIRCONIUM CYCLOSILICATE 5 G PO PACK
5.0000 g | PACK | Freq: Once | ORAL | Status: AC
  Administered 2023-06-02: 5 g via ORAL
  Filled 2023-06-02: qty 1

## 2023-06-02 MED ORDER — LACTATED RINGERS IV BOLUS
1000.0000 mL | Freq: Once | INTRAVENOUS | Status: AC
  Administered 2023-06-02: 1000 mL via INTRAVENOUS

## 2023-06-02 MED ORDER — INSULIN ASPART 100 UNIT/ML IJ SOLN
0.0000 [IU] | Freq: Three times a day (TID) | INTRAMUSCULAR | Status: DC
  Administered 2023-06-02: 3 [IU] via SUBCUTANEOUS
  Administered 2023-06-03: 1 [IU] via SUBCUTANEOUS
  Administered 2023-06-03: 2 [IU] via SUBCUTANEOUS
  Administered 2023-06-03: 3 [IU] via SUBCUTANEOUS
  Administered 2023-06-03: 2 [IU] via SUBCUTANEOUS
  Administered 2023-06-04: 3 [IU] via SUBCUTANEOUS
  Administered 2023-06-04: 1 [IU] via SUBCUTANEOUS

## 2023-06-02 MED ORDER — RIVAROXABAN 10 MG PO TABS: 20.0000 mg | ORAL_TABLET | Freq: Every day | ORAL | Status: DC

## 2023-06-02 MED ORDER — LATANOPROST 0.005 % OP SOLN
1.0000 [drp] | Freq: Every day | OPHTHALMIC | Status: DC
  Administered 2023-06-02 – 2023-06-10 (×9): 1 [drp] via OPHTHALMIC
  Filled 2023-06-02: qty 2.5

## 2023-06-02 MED ORDER — LEVOTHYROXINE SODIUM 75 MCG PO TABS
75.0000 ug | ORAL_TABLET | Freq: Every day | ORAL | Status: DC
  Administered 2023-06-04 – 2023-06-11 (×8): 75 ug via ORAL
  Filled 2023-06-02 (×8): qty 1

## 2023-06-02 MED ORDER — VANCOMYCIN HCL IN DEXTROSE 1-5 GM/200ML-% IV SOLN: 1000.0000 mg | Freq: Once | INTRAVENOUS | Status: DC

## 2023-06-02 MED ORDER — VANCOMYCIN HCL 1500 MG/300ML IV SOLN
1500.0000 mg | Freq: Once | INTRAVENOUS | Status: AC
  Administered 2023-06-02: 1500 mg via INTRAVENOUS
  Filled 2023-06-02: qty 300

## 2023-06-02 MED ORDER — LACTATED RINGERS IV SOLN: INTRAVENOUS | Status: AC

## 2023-06-02 MED ORDER — DORZOLAMIDE HCL-TIMOLOL MAL 2-0.5 % OP SOLN
1.0000 [drp] | Freq: Two times a day (BID) | OPHTHALMIC | Status: DC
  Administered 2023-06-02 – 2023-06-11 (×18): 1 [drp] via OPHTHALMIC
  Filled 2023-06-02: qty 10

## 2023-06-02 MED ORDER — DILTIAZEM HCL-DEXTROSE 125-5 MG/125ML-% IV SOLN (PREMIX)
5.0000 mg/h | INTRAVENOUS | Status: DC
  Administered 2023-06-02 – 2023-06-03 (×2): 5 mg/h via INTRAVENOUS
  Filled 2023-06-02 (×2): qty 125

## 2023-06-02 MED ORDER — IOHEXOL 350 MG/ML SOLN
75.0000 mL | Freq: Once | INTRAVENOUS | Status: AC | PRN
  Administered 2023-06-02: 75 mL via INTRAVENOUS

## 2023-06-02 MED ORDER — SODIUM CHLORIDE 0.9 % IV SOLN
2.0000 g | Freq: Once | INTRAVENOUS | Status: AC
  Administered 2023-06-02: 2 g via INTRAVENOUS
  Filled 2023-06-02: qty 12.5

## 2023-06-02 MED ORDER — DILTIAZEM LOAD VIA INFUSION
10.0000 mg | Freq: Once | INTRAVENOUS | Status: AC
  Administered 2023-06-02: 10 mg via INTRAVENOUS
  Filled 2023-06-02: qty 10

## 2023-06-02 NOTE — Assessment & Plan Note (Signed)
-  son reports difficulty with swallowing pills at home -while receiving Lokelma solution in ED, pt appears to have aspirated and had oxygen desaturation down to mid-80s. Placed on oxygen supplementation briefly with improvement -obtain speech eval -keep NPO

## 2023-06-02 NOTE — Assessment & Plan Note (Addendum)
-  most likely secondary to cellulitis sacral ulcer wounds as her CXR, CT A/P, UA, head CT was negative. No other obvious source except for wounds.  -administered IV vancomycin and cefepime in ED. Will switch to IV Rocephin -wound care consulted -continue to trend lactate -received a liter bolus. Will keep on continuous fluids overnight given sepsis and AKI

## 2023-06-02 NOTE — Assessment & Plan Note (Signed)
-  no longer on statin per cardiology as pt wanted to decrease medication burden

## 2023-06-02 NOTE — ED Notes (Signed)
ED TO INPATIENT HANDOFF REPORT  ED Nurse Name and Phone #: [phone]  S Name/Age/Gender Crystal Ashley 77 y.o. female Room/Bed: 022C/022C  Code Status   Code Status: Prior  Home/SNF/Other Home Patient oriented to: self Is this baseline? Yes   Triage Complete: Triage complete  Chief Complaint ams  Triage Note [facility] from home. Family reports AMS since yesterday morning. Reports some confusion at baseline. No focal neuro deficits. Glucose 385.    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   Amiodarone     Nausea    Level of Care/Admitting Diagnosis ED Disposition     ED Disposition  Admit   Condition  --   Comment  The patient appears reasonably stabilized for admission considering the current resources, flow, and capabilities available in the ED at this time, and I doubt any other [facility] requiring further screening and/or treatment in the ED prior to admission is  present.          B Medical/Surgery History Past Medical History:  Diagnosis Date   Atrial fibrillation (HCC)    Cancer (HCC)    Cataract    Diabetes mellitus without complication (HCC)    Fracture 2001   left ankle   Glaucoma    Hypertension    Thyroid disease    Past Surgical History:  Procedure Laterality Date   APPLICATION OF ROBOTIC ASSISTANCE FOR SPINAL PROCEDURE N/A 02/26/2020   Procedure: APPLICATION OF ROBOTIC ASSISTANCE FOR SPINAL PROCEDURE;  Surgeon: Bedelia Person, MD;  Location: [facility] OR;  Service: Neurosurgery;  Laterality: N/A;   CARDIOVERSION N/A 04/08/2018   Procedure: CARDIOVERSION;  Surgeon: Yates Decamp, MD;  Location: [facility] ENDOSCOPY;  Service: Cardiovascular;  Laterality: N/A;   CARDIOVERSION N/A 05/22/2019   Procedure: CARDIOVERSION;  Surgeon: Yates Decamp, MD;  Location: [facility] ENDOSCOPY;  Service: Cardiovascular;  Laterality: N/A;   FOOT SURGERY Left     HYSTEROSCOPY     POLYPECTOMY   LUMBAR PERCUTANEOUS PEDICLE SCREW 4 LEVEL N/A 02/26/2020   Procedure: Thoracic eight to thoracic twelve posterior percutaneous instrumentation with cement augmentation and bilateral medial facetectomy for decompression at Thoracic ten-eleven;  Surgeon: Bedelia Person, MD;  Location: [facility] OR;  Service: Neurosurgery;  Laterality: N/A;     A IV Location/Drains/Wounds Patient Lines/Drains/Airways Status     Active Line/Drains/Airways     Name Placement date Placement time Site Days   Peripheral IV 06/02/23 20 G 1.88" Left Forearm 06/02/23  1048  Forearm  less than 1   Peripheral IV 06/02/23 22 G 2.5" Anterior;Right Forearm 06/02/23  1836  Forearm  less than 1   Pressure Injury 06/02/23 Buttocks 06/02/23  1400  -- less than 1            Intake/Output Last 24 hours  Intake/Output Summary (Last 24 hours) at 06/02/2023 1858 Last data filed at 06/02/2023 1405 Gross per 24 hour  Intake 1000 ml  Output --  Net 1000 ml    Labs/Imaging Results for orders placed or performed during the hospital encounter of 06/02/23 (from the past 48 hour(s))  POC CBG, ED     Status: Abnormal   Collection Time: 06/02/23 10:46 AM  Result Value Ref Range   Glucose-Capillary 290 (H) 70 - 99 mg/dL    Comment: Glucose reference range applies only to samples taken after fasting for at least 8 hours.  Lipase, blood  Status: None   Collection Time: 06/02/23 11:04 AM  Result Value Ref Range   Lipase 24 11 - 51 U/L    Comment: Performed at [facility], [address] [place], [place] [address]  Comprehensive metabolic panel     Status: Abnormal   Collection Time: 06/02/23 11:04 AM  Result Value Ref Range   Sodium 132 (L) 135 - 145 mmol/L   Potassium 5.3 (H) 3.5 - 5.1 mmol/L   Chloride 92 (L) 98 - 111 mmol/L   CO2 25 22 - 32 mmol/L   Glucose, Bld 375 (H) 70 - 99 mg/dL    Comment: Glucose reference range applies only to samples taken after fasting for at least 8  hours.   BUN 26 (H) 8 - 23 mg/dL   Creatinine, Ser 0.10 (H) 0.44 - 1.00 mg/dL   Calcium 9.1 8.9 - 27.2 mg/dL   Total Protein 7.2 6.5 - 8.1 g/dL   Albumin 3.0 (L) 3.5 - 5.0 g/dL   AST 25 15 - 41 U/L   ALT 26 0 - 44 U/L   Alkaline Phosphatase 136 (H) 38 - 126 U/L   Total Bilirubin 0.9 0.3 - 1.2 mg/dL   GFR, Estimated 40 (L) >60 mL/min    Comment: (NOTE) Calculated using the CKD-EPI Creatinine Equation (2021)    Anion gap 15 5 - 15    Comment: Performed at [facility], [address], [place], [place] [address]  CBC     Status: Abnormal   Collection Time: 06/02/23 11:04 AM  Result Value Ref Range   WBC 16.9 (H) 4.0 - 10.5 K/uL   RBC 3.97 3.87 - 5.11 MIL/uL   Hemoglobin 11.9 (L) 12.0 - 15.0 g/dL   HCT 40.3 47.4 - 25.9 %   MCV 94.0 80.0 - 100.0 fL   MCH 30.0 26.0 - 34.0 pg   MCHC 31.9 30.0 - 36.0 g/dL   RDW 56.3 (H) 87.5 - 64.3 %   Platelets 393 150 - 400 K/uL   nRBC 0.0 0.0 - 0.2 %    Comment: Performed at [facility], [address], [place], [place] [address]  T4, free     Status: Abnormal   Collection Time: 06/02/23 11:04 AM  Result Value Ref Range   Free T4 1.55 (H) 0.61 - 1.12 ng/dL    Comment: (NOTE) Biotin ingestion may interfere with free T4 tests. If the results are inconsistent with the TSH level, previous test results, or the clinical presentation, then consider biotin interference. If needed, order repeat testing after stopping biotin. Performed at [facility], [address] [place], [place] [address]   Osmolality     Status: Abnormal   Collection Time: 06/02/23 11:04 AM  Result Value Ref Range   Osmolality 304 (H) 275 - 295 mOsm/kg    Comment: Performed at [facility], [address] [place], [place] [address]  Brain natriuretic peptide     Status: None   Collection Time: 06/02/23 11:05 AM  Result Value Ref Range   B Natriuretic Peptide 88.5 0.0 - 100.0 pg/mL    Comment: Performed at [facility], [address]  [place], [place] [address]  Ammonia     Status: None   Collection Time: 06/02/23 11:06 AM  Result Value Ref Range   Ammonia 27 9 - 35 umol/L    Comment: HEMOLYSIS AT THIS LEVEL MAY AFFECT RESULT Performed at [facility], [address] [place], [place] [address]   I-stat chem 8, ED (not at [facility], [facility] or [facility])     Status: Abnormal   Collection Time: 06/02/23 11:11 AM  Result Value Ref Range   Sodium 130 (L) 135 - 145 mmol/L   Potassium 5.2 (H) 3.5 - 5.1 mmol/L   Chloride 93 (L) 98 - 111 mmol/L   BUN 27 (H) 8 - 23 mg/dL   Creatinine, Ser 6.04 (H) 0.44 - 1.00 mg/dL   Glucose, Bld 540 (H) 70 - 99 mg/dL    Comment: Glucose reference range applies only to samples taken after fasting for at least 8 hours.   Calcium, Ion 1.10 (L) 1.15 - 1.40 mmol/L   TCO2 27 22 - 32 mmol/L   Hemoglobin 12.9 12.0 - 15.0 g/dL   HCT 98.1 19.1 - 47.8 %  I-Stat CG4 Lactic Acid     Status: Abnormal   Collection Time: 06/02/23 11:12 AM  Result Value Ref Range   Lactic Acid, Venous 3.8 (HH) 0.5 - 1.9 mmol/L   Comment NOTIFIED PHYSICIAN   TSH     Status: None   Collection Time: 06/02/23 11:13 AM  Result Value Ref Range   TSH 2.900 0.350 - 4.500 uIU/mL    Comment: Performed by a 3rd Generation assay with a functional sensitivity of <=0.01 uIU/mL. Performed at [facility], [address], [place], [place] [address]   Urinalysis, Routine w reflex microscopic -Urine, Clean Catch     Status: Abnormal   Collection Time: 06/02/23 11:28 AM  Result Value Ref Range   Color, Urine YELLOW YELLOW   APPearance CLEAR CLEAR   Specific Gravity, Urine 1.019 1.005 - 1.030   pH 6.0 5.0 - 8.0   Glucose, UA 150 (A) NEGATIVE mg/dL   Hgb urine dipstick NEGATIVE NEGATIVE   Bilirubin Urine NEGATIVE NEGATIVE   Ketones, ur NEGATIVE NEGATIVE mg/dL   Protein, ur NEGATIVE NEGATIVE mg/dL   Nitrite NEGATIVE NEGATIVE   Leukocytes,Ua NEGATIVE NEGATIVE    Comment: Performed at [facility], [address], [place],  [place] [address]  I-Stat CG4 Lactic Acid     Status: Abnormal   Collection Time: 06/02/23  2:10 PM  Result Value Ref Range   Lactic Acid, Venous 3.1 (HH) 0.5 - 1.9 mmol/L   Comment NOTIFIED PHYSICIAN    CT ABDOMEN PELVIS W CONTRAST  Result Date: 06/02/2023 CLINICAL DATA:  Acute nonlocalized abdominal pain EXAM: CT ABDOMEN AND PELVIS WITH CONTRAST TECHNIQUE: Multidetector CT imaging of the abdomen and pelvis was performed using the standard protocol following bolus administration of intravenous contrast. RADIATION DOSE REDUCTION: This exam was performed according to the departmental dose-optimization program which includes automated exposure control, adjustment of the mA and/or kV according to patient size and/or use of iterative reconstruction technique. CONTRAST:  75mL OMNIPAQUE IOHEXOL 350 MG/ML SOLN COMPARISON:  CT pelvis 02/07/2020 and CT chest 07/21/2020 FINDINGS: Lower chest: Nonspecific edema within the left breast and chest wall subcutaneous fat. Hepatobiliary: Cholelithiasis without evidence of cholecystitis. Unremarkable liver and biliary tree. Pancreas: Unremarkable. Spleen: Unremarkable. Adrenals/Urinary Tract: Stable right adrenal adenoma. No follow-up recommended. Additional stable right adrenal myelolipoma. No follow-up recommended. No urinary calculi or hydronephrosis. Bladder is unremarkable. Stomach/Bowel: Normal caliber large and small bowel. No bowel wall thickening. The appendix is normal.Stomach is within normal limits. Vascular/Lymphatic: No significant vascular findings are present. No enlarged abdominal or pelvic lymph nodes. Reproductive: Unremarkable. Other: No free intraperitoneal fluid or air. Musculoskeletal: No acute fracture. Partially  visualized thoracic fusion hardware. IMPRESSION: 1. No acute abnormality in the abdomen or pelvis. 2. Cholelithiasis without evidence of cholecystitis. 3. Nonspecific edema within the left breast and chest wall subcutaneous fat. Electronically Signed    By: Minerva Fester M.D.   On: 06/02/2023 16:59   CT Head Wo Contrast  Result Date: 06/02/2023 CLINICAL DATA:  Provided history: Mental status change, unknown cause. EXAM: CT HEAD WITHOUT CONTRAST TECHNIQUE: Contiguous axial images were obtained from the base of the skull through the vertex without intravenous contrast. RADIATION DOSE REDUCTION: This exam was performed according to the departmental dose-optimization program which includes automated exposure control, adjustment of the mA and/or kV according to patient size and/or use of iterative reconstruction technique. COMPARISON:  Prior head CT examinations 11/07/2021 and earlier. FINDINGS: Brain: Cerebral atrophy. Similar to the prior head CT of 11/07/2021, there is moderate lateral and third ventriculomegaly. Prominence of the sylvian fissures. Acute callosal angle. Relative crowding of the sulci at the vertex. Patchy and ill-defined hypoattenuation within the cerebral white matter, nonspecific but compatible with moderate chronic small vessel ischemic disease. There is no acute intracranial hemorrhage. No demarcated cortical infarct. No extra-axial fluid collection. No evidence of an intracranial mass. No midline shift. Vascular: No hyperdense vessel.  Atherosclerotic calcifications. Skull: No acute fracture or aggressive osseous lesion. Tiny osteoma projecting outward from the anterior right frontal calvarium again noted. Sinuses/Orbits: No mass or acute finding within the imaged orbits. Small-volume frothy secretions within the left sphenoid sinus. IMPRESSION: 1. No evidence of an acute intracranial abnormality. 2. Moderate cerebral white matter chronic small vessel ischemic disease. 3. Moderate lateral and third ventriculomegaly, similar to the prior head CT of 11/07/2021. While this is at least partly due to cerebral volume loss, there are some imaging features which can be seen in the setting of normal pressure hydrocephalus and superimposed NPH  cannot be excluded. 4. Mild left sphenoid sinusitis. Electronically Signed   By: Jackey Loge D.O.   On: 06/02/2023 14:44   DG Chest Portable 1 View  Result Date: 06/02/2023 CLINICAL DATA:  Shortness of breath EXAM: PORTABLE CHEST 1 VIEW COMPARISON:  Chest radiograph 11/03/2021 FINDINGS: The cardiomediastinal silhouette is stable and within normal limits. There is no focal consolidation or pulmonary edema. There is no pleural effusion or pneumothorax There is no acute osseous abnormality. Thoracolumbar spine fusion hardware is again noted. IMPRESSION: Stable chest with no radiographic evidence of acute cardiopulmonary process. Electronically Signed   By: Lesia Hausen M.D.   On: 06/02/2023 14:36    Pending Labs Unresulted Labs (From admission, onward)    None       Vitals/Pain Today's Vitals   06/02/23 1700 06/02/23 1745 06/02/23 1800 06/02/23 1815  BP: 102/69 107/61 118/61 110/74  Pulse: 78 98 93 94  Resp: 19 (!) 23 18 20   Temp:      TempSrc:      SpO2: 100% 100% 100% 100%  Weight:      Height:        Isolation Precautions No active isolations  Medications Medications  diltiazem (CARDIZEM) 1 mg/mL load via infusion 10 mg (10 mg Intravenous Bolus from Bag 06/02/23 1644)    And  diltiazem (CARDIZEM) 125 mg in dextrose 5% 125 mL (1 mg/mL) infusion (5 mg/hr Intravenous New Bag/Given 06/02/23 1644)  ceFEPIme (MAXIPIME) 2 g in sodium chloride 0.9 % 100 mL IVPB (2 g Intravenous New Bag/Given 06/02/23 1844)  vancomycin (VANCOREADY) IVPB 1500 mg/300 mL (has no administration in time range)  lactated ringers bolus 1,000 mL (0 mLs Intravenous Stopped 06/02/23 1405)  iohexol (OMNIPAQUE) 350 MG/ML injection 75 mL (75 mLs Intravenous Contrast Given 06/02/23 1444)    Mobility Non-amb     Focused Assessments Cardiac Assessment Handoff:  Cardiac Rhythm: Atrial fibrillation Lab Results  Component Value Date   TROPONINI <0.03 10/18/2018   Lab Results  Component Value Date    DDIMER 0.30 10/18/2018   Does the Patient currently have chest pain? No    R Recommendations: See Admitting Provider Note  Report given to:   Additional Notes: Dilt gtt

## 2023-06-02 NOTE — Assessment & Plan Note (Signed)
Continue levothyroxine 

## 2023-06-02 NOTE — ED Provider Notes (Signed)
[place] [facility]ERGENC[facility] DEPARTMENT AT [facility] Provider Note   CSN: [id number] Arrival date & time: 06/02/23  1034     History  Chief Complaint  Patient presents with   Altered Mental Status    Crystal Ashley is a 77 y.o. female past medical history significant for permanent A-fib on daily Xarelto, with metoprolol for rate control who presents with concern for altered mental status.  Caretaker had noticed some altered mental status since yesterday morning.  Patient is confused secondary to dementia at baseline but normally can use walking device without assistance and is somewhat responsive.  Normally follows some commands.  This morning son reports that she is more altered than usual.  She did have elevated glucose this morning, glucose 385, notably recently restarted metformin for her diabetes.  Otherwise no recent medication changes.  Initially with history of hypertension, hyperlipidemia   Altered Mental Status      Home Medications Prior to Admission medications   Medication Sig Start Date End Date Taking? Authorizing Provider  atorvastatin (LIPITOR) 20 MG tablet Take 1 tablet (20 mg total) by mouth daily. 05/27/22  Yes Ardith Dark, MD  dorzolamide-timolol (COSOPT) 22.3-6.8 MG/ML ophthalmic solution Place 1 drop into both eyes 2 (two) times daily. 10/05/17  Yes [provider]  ferrous sulfate (FER-IN-SOL) 75 (15 Fe) MG/ML SOLN Take 4 mLs by mouth daily.   Yes [provider]  furosemide (LASIX) 40 MG tablet TAKE 1 TABLET EVERY DAY. CHANGE TO 1 TABLET EVERY OTHER DAY WHEN DOWN 10 POUNDS AS DIRECTED 06/01/23  Yes Chandrasekhar, Mahesh A, MD  Glucose Blood (BLOOD GLUCOSE TEST STRIPS) STRP 1 each by Other route 2 (two) times daily. USE ONE TEST STRIP TWICE DAILY TO CHECK BLOOD SUGAR E11.9 11/10/21  Yes Ardith Dark, MD  latanoprost (XALATAN) 0.005 % ophthalmic solution 1 drop at bedtime.   Yes [provider]  levothyroxine (SYNTHROID)  75 MCG tablet TAKE 1 TABLET EVERY DAY BEFORE BREAKFAST 07/20/22  Yes Ardith Dark, MD  metFORMIN (GLUCOPHAGE) 500 MG tablet Take 1 tablet (500 mg total) by mouth daily with breakfast. 05/27/23  Yes Ardith Dark, MD  metoprolol succinate (TOPROL-XL) 25 MG 24 hr tablet TAKE 1 TABLET EVERY DAY (CALL [phone] TO SCHEDULE APPOINTMENT FOR REFILLS) Patient taking differently: 25 mg daily. 03/23/22  Yes Chandrasekhar, Mahesh A, MD  pantoprazole (PROTONIX) 40 MG tablet TAKE 1 TABLET EVERY DAY (PLEASE REFILL WITH PRIMARY CARE PROVIDER FOR ANYMORE REFILLS) 08/03/22  Yes Ardith Dark, MD  Potassium Chloride 10 % SOLN Take 10 mEq by mouth daily. Extended release; Use 7.11ml of solution with 2 oz of water   Yes [provider]  spironolactone (ALDACTONE) 25 MG tablet Take 1 tablet (25 mg total) by mouth daily. 08/03/22  Yes Chandrasekhar, Mahesh A, MD  vitamin B-12 (CYANOCOBALAMIN) 500 MCG tablet Take 500 mcg by mouth daily.    Yes [provider]  XARELTO 20 MG TABS tablet TAKE 1 TABLET EVERY DAY WITH BREAKFAST Patient taking differently: 20 mg daily with supper. 04/20/23  Yes Chandrasekhar, Mahesh A, MD  blood glucose meter kit and supplies Check glucose 3 times daily or PRN E11.9 11/12/21   Ardith Dark, MD      Allergies    Meat [alpha-gal] and Amiodarone    Review of Systems   Review of Systems  Reason unable to perform ROS: AMS.    Physical Exam Updated Vital Signs BP (!) 140/92   Pulse Marland Kitchen)  115   Temp 98.8 F (37.1 C) (Rectal)   Resp 20   Ht 5' (1.524 m)   Wt 82.6 kg   SpO2 100%   BMI 35.54 kg/m  Physical Exam Vitals and nursing note reviewed.  Constitutional:      General: She is not in acute distress.    Appearance: She is obese. She is ill-appearing.     Comments: Chronically ill appearing  HENT:     Head: Normocephalic and atraumatic.  Eyes:     General:        Right eye: No discharge.        Left eye: No discharge.  Neck:     Comments: Some soft  tissue neck swelling left greater than right, without rigidity or induration Cardiovascular:     Rate and Rhythm: Tachycardia present. Rhythm irregular.     Heart sounds: No murmur heard.    No friction rub. No gallop.  Pulmonary:     Effort: Pulmonary effort is normal.     Breath sounds: Normal breath sounds.  Abdominal:     General: Bowel sounds are normal.     Palpations: Abdomen is soft.     Comments: Patient with some discomfort with abdominal palpation, no rebound, rigidity, guarding.  Skin:    General: Skin is warm and dry.     Capillary Refill: Capillary refill takes less than 2 seconds.     Comments: Multiple foul-smelling sacral ulcers on the buttocks, photos in chart.  No abscess or purulent drainage noted.  Neurological:     Mental Status: She is alert. She is disoriented.     Comments: Follows commands, moves all 4 limbs spontaneously     ED Results / Procedures / Treatments   Labs (all labs ordered are listed, but only abnormal results are displayed) Labs Reviewed  COMPREHENSIVE METABOLIC PANEL - Abnormal; Notable for the following components:      Result Value   Sodium 132 (*)    Potassium 5.3 (*)    Chloride 92 (*)    Glucose, Bld 375 (*)    BUN 26 (*)    Creatinine, Ser 1.36 (*)    Albumin 3.0 (*)    Alkaline Phosphatase 136 (*)    GFR, Estimated 40 (*)    All other components within normal limits  CBC - Abnormal; Notable for the following components:   WBC 16.9 (*)    Hemoglobin 11.9 (*)    RDW 16.3 (*)    All other components within normal limits  URINALYSIS, ROUTINE W REFLEX MICROSCOPIC - Abnormal; Notable for the following components:   Glucose, UA 150 (*)    All other components within normal limits  T4, FREE - Abnormal; Notable for the following components:   Free T4 1.55 (*)    All other components within normal limits  I-STAT CHEM 8, ED - Abnormal; Notable for the following components:   Sodium 130 (*)    Potassium 5.2 (*)    Chloride 93  (*)    BUN 27 (*)    Creatinine, Ser 1.20 (*)    Glucose, Bld 381 (*)    Calcium, Ion 1.10 (*)    All other components within normal limits  CBG MONITORING, ED - Abnormal; Notable for the following components:   Glucose-Capillary 290 (*)    All other components within normal limits  I-STAT CG4 LACTIC ACID, ED - Abnormal; Notable for the following components:   Lactic Acid, Venous 3.8 (*)  All other components within normal limits  I-STAT CG4 LACTIC ACID, ED - Abnormal; Notable for the following components:   Lactic Acid, Venous 3.1 (*)    All other components within normal limits  LIPASE, BLOOD  BRAIN NATRIURETIC PEPTIDE  AMMONIA  TSH    EKG EKG Interpretation Date/Time:  Wednesday June 02 2023 10:46:47 EDT Ventricular Rate:  122 PR Interval:    QRS Duration:  102 QT Interval:  294 QTC Calculation: 419 R Axis:   27  Text Interpretation: Atrial fibrillation Low voltage, extremity leads Borderline repolarization abnormality No significant change since last tracing Confirmed by Elayne Snare ([id number]) on 06/02/2023 11:09:39 AM  Radiology CT Head Wo Contrast  Result Date: 06/02/2023 CLINICAL DATA:  Provided history: Mental status change, unknown cause. EXAM: CT HEAD WITHOUT CONTRAST TECHNIQUE: Contiguous axial images were obtained from the base of the skull through the vertex without intravenous contrast. RADIATION DOSE REDUCTION: This exam was performed according to the departmental dose-optimization program which includes automated exposure control, adjustment of the mA and/or kV according to patient size and/or use of iterative reconstruction technique. COMPARISON:  Prior head CT examinations 11/07/2021 and earlier. FINDINGS: Brain: Cerebral atrophy. Similar to the prior head CT of 11/07/2021, there is moderate lateral and third ventriculomegaly. Prominence of the sylvian fissures. Acute callosal angle. Relative crowding of the sulci at the vertex. Patchy and ill-defined  hypoattenuation within the cerebral white matter, nonspecific but compatible with moderate chronic small vessel ischemic disease. There is no acute intracranial hemorrhage. No demarcated cortical infarct. No extra-axial fluid collection. No evidence of an intracranial mass. No midline shift. Vascular: No hyperdense vessel.  Atherosclerotic calcifications. Skull: No acute fracture or aggressive osseous lesion. Tiny osteoma projecting outward from the anterior right frontal calvarium again noted. Sinuses/Orbits: No mass or acute finding within the imaged orbits. Small-volume frothy secretions within the left sphenoid sinus. IMPRESSION: 1. No evidence of an acute intracranial abnormality. 2. Moderate cerebral white matter chronic small vessel ischemic disease. 3. Moderate lateral and third ventriculomegaly, similar to the prior head CT of 11/07/2021. While this is at least partly due to cerebral volume loss, there are some imaging features which can be seen in the setting of normal pressure hydrocephalus and superimposed NPH cannot be excluded. 4. Mild left sphenoid sinusitis. Electronically Signed   By: Jackey Loge D.O.   On: 06/02/2023 14:44   DG Chest Portable 1 View  Result Date: 06/02/2023 CLINICAL DATA:  Shortness of breath EXAM: PORTABLE CHEST 1 VIEW COMPARISON:  Chest radiograph 11/03/2021 FINDINGS: The cardiomediastinal silhouette is stable and within normal limits. There is no focal consolidation or pulmonary edema. There is no pleural effusion or pneumothorax There is no acute osseous abnormality. Thoracolumbar spine fusion hardware is again noted. IMPRESSION: Stable chest with no radiographic evidence of acute cardiopulmonary process. Electronically Signed   By: Lesia Hausen M.D.   On: 06/02/2023 14:36    Procedures Procedures    Medications Ordered in ED Medications  lactated ringers bolus 1,000 mL (0 mLs Intravenous Stopped 06/02/23 1405)  iohexol (OMNIPAQUE) 350 MG/ML injection 75 mL (75  mLs Intravenous Contrast Given 06/02/23 1444)    ED Course/ Medical Decision Making/ A&P Clinical Course as of 06/02/23 1529  Wed Jun 02, 2023  1158 WBC(!): 16.9 Significant leukocytosis, lactic acidosis, suspicion for infection.  Considered abdomen infection vs sacral ulcer wounds for source [CP]  1506 Admit for sirs, possible intraabd vs sacral wounds. AMS from baseline. Can walk with assist, not very talkative at  baseline, can follow commands. Son transplates [BH]    Clinical Course User Index [BH] Henderly, Britni A, PA-C [CP] Verlena Marlette, Harrel Carina, PA-C                                 Medical Decision Making Amount and/or Complexity of Data Reviewed Labs: ordered. Decision-making details documented in ED Course. Radiology: ordered.   This patient is a 77 y.o. female who presents to the ED for concern of ams, sacral wound, this involves an extensive number of treatment options, and is a complaint that carries with it a high risk of complications and morbidity. The emergent differential diagnosis prior to evaluation includes, but is not limited to,  CVA, seizure, hypotension, sepsis, hypoglycemia, hypoxic encephalopathy, metabolic encephalopathy, polypharmacy, substance abuse, developing dementia or alzheimers, meningitis, encephalitis, hypertensive emergency, other systemic infection, acute alcohol intoxication, acute alcohol or other drug withdrawal or psychiatric manifestation vs other . This is not an exhaustive differential.   Past Medical History / Co-morbidities / Social History: A-fib on daily Xarelto, with metoprolol for rate control  Additional history: Chart reviewed. Pertinent results include: reviewed outpatient family medicine visits, cardiology visits  Physical Exam: Physical exam performed. The pertinent findings include:  She is obese. She is ill-appearing.     Comments: Chronically ill appearing  Some soft tissue neck swelling left greater than right, without  rigidity or induration Tachycardia present. Rhythm irregular. Patient with some discomfort with abdominal palpation, no rebound, rigidity, guarding. Multiple foul-smelling sacral ulcers on the buttocks, photos in chart.  No abscess or purulent drainage noted.  Follows commands, moves all 4 limbs spontaneously   Lab Tests: I ordered, and personally interpreted labs.  The pertinent results include:  Significant leukocytosis, lactic acidosis, suspicion for infection.  Considered abdomen infection vs sacral ulcer wounds for source.  Patient with some hyponatremia, sodium 132, hyperkalemia, testing 5.3, hyponatremia is a pseudohyponatremia with glucose of 375.  She does have a mild AKI as well with creatinine 1.36, BUN 26, baseline creatinine normally 1.09.  Initial lactic acid 3.8, repeat at 3.1, UA unremarkable, free T4 is mildly elevated but TSH normal.  Normal ammonia, normal lipase.   Imaging Studies: I ordered imaging studies including CT head, and plain film chest x-ray, CT abdomen pelvis with contrast pending official radiology read at time of handoff. I independently visualized and interpreted imaging which showed CT head, and plain film chest x-ray with no acute abnormality, will defer to radiologist for official CT abdomen pelvis interpretation, no acute localizing process noted on my independent interpretation. I agree with the radiologist interpretation.   Cardiac Monitoring:  The patient was maintained on a cardiac monitor.  My attending physician Dr. Theresia Lo viewed and interpreted the cardiac monitored which showed an underlying rhythm of: afib, rapid ventricular rate. I agree with this interpretation.   Medications: I ordered medication including fluid bolus for SIRS, since she does not currently meet sepsis criteria even after rectal temperature will hold on antibiotics until source is confirmed  3:29 PM Care of Catriona Molli Posey transferred to [facility] and Dr. Adela Lank at the  end of my shift as the patient will require reassessment once labs/imaging have resulted. Patient presentation, ED course, and plan of care discussed with review of all pertinent labs and imaging. Please see his/her note for further details regarding further ED course and disposition. Plan at time of handoff is admit for sirs pending CTAP  results. This may be altered or completely changed at the discretion of the oncoming team pending results of further workup.   Final Clinical Impression(s) / ED Diagnoses Final diagnoses:  None    Rx / DC Orders ED Discharge Orders     None         Olene Floss, PA-C 06/02/23 1529    Rexford Maus, DO 06/02/23 1540

## 2023-06-02 NOTE — Assessment & Plan Note (Signed)
-  on IV fluids  -follow trend

## 2023-06-02 NOTE — Assessment & Plan Note (Signed)
-  recent A1C of 9.5 and was restarted on metformin recently -place on SSI

## 2023-06-02 NOTE — Assessment & Plan Note (Signed)
-  secondary to dehydration and sepsis -baseline is non-verbal but can help with transfer and feeding but today unable to perform those tasks

## 2023-06-02 NOTE — Assessment & Plan Note (Signed)
-  Continue on diltiazem infusion with goal HR <100 and SBP >95 -Continue beta-blocker -continue Xarelto

## 2023-06-02 NOTE — ED Notes (Signed)
Patient noted to desat. This RN applied 5L [place] and notified provider.

## 2023-06-02 NOTE — Progress Notes (Signed)
Unable to complete admission due to AMS.

## 2023-06-02 NOTE — Inpatient Diabetes Management (Signed)
Inpatient Diabetes Program Recommendations  AACE/ADA: New Consensus Statement on Inpatient Glycemic Control (2015)  Target Ranges:  Prepandial:   less than 140 mg/dL      Peak postprandial:   less than 180 mg/dL (1-2 hours)      Critically ill patients:  140 - 180 mg/dL   Lab Results  Component Value Date   GLUCAP 290 (H) 06/02/2023   HGBA1C 9.5 (A) 05/20/2023    Review of Glycemic Control  Latest Reference Range & Units 11/17/22 14:01 05/20/23 14:25  Hemoglobin A1C 4.0 - 5.6 % -  7.9 ! (C) 9.5 ! Pend   Diabetes history: DM 2 Outpatient Diabetes medications: metformin 500 mg Daily Current orders for Inpatient glycemic control: none being evaluated in ED  Note: pt with recent A1c obtained from PCP office at 9.5% restarted on metformin and educated on lifestyle modifications. Pt has been on metformin in the past with good results.  Inpatient Diabetes Program Recommendations:    -   consider CBGs and Novolog 0-9 units td + hs (or Q4 if NPO)  Thanks,  Christena Deem RN, MSN, BC-ADM Inpatient Diabetes Coordinator Team Pager [phone] (8a-5p)

## 2023-06-02 NOTE — ED Triage Notes (Signed)
[facility] from home. Family reports AMS since yesterday morning. Reports some confusion at baseline. No focal neuro deficits. Glucose 385.

## 2023-06-02 NOTE — ED Notes (Signed)
Son at bedside. Confirmed timeline- pt was normal when she woke up yesterday and then was noted to be altered at lunch when her caregiver noted pt not as independent with feeding herself as normal. This morning the pt's son got her up to the bathroom but pt was unable to assist as she normally does. She did not cut up her breakfast like she normally does each morning. Recently restarted on metformin d/t hyperglycemia.

## 2023-06-02 NOTE — H&P (Addendum)
History and Physical    Patient: Crystal Ashley WUJ:[id number] DOB: [birth year] DOA: 06/02/2023 DOS: the patient was seen and examined on 06/02/2023 PCP: Ardith Dark, MD  Patient coming from: Home  Chief Complaint:  Chief Complaint  Patient presents with   Altered Mental Status   HPI: Crystal Ashley is a 77 y.o. 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   Pt unable to provide history as she was non-verbal which is her baseline. Son provides hx over the phone. He is her caretaker and she also has day time aid. Pt is mostly bed-bound and has not ambulate in a year and a half. Today she was not helping with transfer. Son usually puts a belt around her and she helps push up on bed rails but today was just slumped over. She also was very diaphoretic. Did not cut up her breakfast as she usually does. Appeared weak. Only ate 2 bites and appear to want to vomit. Care taker mentioned yesterday she seemed confused about her plate of food at lunch. No vomiting or diarrhea. Has bruising beneath her left breast that son thinks may have happened today with the belt he has around her to help transfer. Had sacral wounds that have been present for about 4 weeks. Had home health come out twice to dress it already.   On arrival to ED, she was in a.fib with RVR rates up to 120, afebrile, normotensive on room air.   CBC notable for leukocytosis of 16.9, Hgb of 11.9 down from 13 (10/13). Lact of 3.1.   BMP with hyponatremia of 120, hyperkalemia of 5.2, Cl of 91, creatinine elevated to 1.36 from baseline of 1. CBG of 375 with anion gap of 15.   CT head and CXR negative for acute process.   CT A/P with nonspecific edema within the left breast and chest wall subcutaneous fat.   She was initiated on diltiazem infusion for a.fib with RVR, administered 1L of LR bolus and  started on IV vancomycin and cefepime for cellulitis.   Hospitalist then consulted for admission.   Review of Systems: unable to review all systems due to the inability of the patient to answer questions. Past Medical History:  Diagnosis Date   Atrial fibrillation (HCC)    Cancer (HCC)    Cataract    Diabetes mellitus without complication (HCC)    Fracture 2001   left ankle   Glaucoma    Hypertension    Thyroid disease    Past Surgical History:  Procedure Laterality Date   APPLICATION OF ROBOTIC ASSISTANCE FOR SPINAL PROCEDURE N/A 02/26/2020   Procedure: APPLICATION OF ROBOTIC ASSISTANCE FOR SPINAL PROCEDURE;  Surgeon: Bedelia Person, MD;  Location[facility] OR;  Service: Neurosurgery;  Laterality: N/A;   CARDIOVERSION N/A 04/08/2018   Procedure: CARDIOVERSION;  Surgeon: Yates Decamp, MD;  Location: [facility] ENDOSCOPY;  Service: Cardiovascular;  Laterality: N/A;   CARDIOVERSION N/A 05/22/2019   Procedure: CARDIOVERSION;  Surgeon: Yates Decamp, MD;  Location: [facility] ENDOSCOPY;  Service: Cardiovascular;  Laterality: N/A;   FOOT SURGERY Left    HYSTEROSCOPY     POLYPECTOMY   LUMBAR PERCUTANEOUS PEDICLE SCREW 4 LEVEL N/A 02/26/2020   Procedure: Thoracic eight to thoracic twelve posterior percutaneous instrumentation with cement augmentation and bilateral medial facetectomy for decompression at Thoracic ten-eleven;  Surgeon: Bedelia Person, MD;  Location: [facility] OR;  Service: Neurosurgery;  Laterality: N/A;   Social History:  reports that she has never smoked. She has never used smokeless tobacco. She reports that she does not drink alcohol and does not use drugs.  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   Amiodarone     Nausea    Family History  Problem Relation Age of Onset   Diabetes Son    Healthy Mother    Healthy Father     Prior to Admission medications   Medication  Sig Start Date End Date Taking? Authorizing Provider  atorvastatin (LIPITOR) 20 MG tablet Take 1 tablet (20 mg total) by mouth daily. 05/27/22  Yes Ardith Dark, MD  dorzolamide-timolol (COSOPT) 22.3-6.8 MG/ML ophthalmic solution Place 1 drop into both eyes 2 (two) times daily. 10/05/17  Yes [provider]  ferrous sulfate (FER-IN-SOL) 75 (15 Fe) MG/ML SOLN Take 4 mLs by mouth daily.   Yes [provider]  furosemide (LASIX) 40 MG tablet TAKE 1 TABLET EVERY DAY. CHANGE TO 1 TABLET EVERY OTHER DAY WHEN DOWN 10 POUNDS AS DIRECTED 06/01/23  Yes Chandrasekhar, Mahesh A, MD  Glucose Blood (BLOOD GLUCOSE TEST STRIPS) STRP 1 each by Other route 2 (two) times daily. USE ONE TEST STRIP TWICE DAILY TO CHECK BLOOD SUGAR E11.9 11/10/21  Yes Ardith Dark, MD  latanoprost (XALATAN) 0.005 % ophthalmic solution 1 drop at bedtime.   Yes [provider]  levothyroxine (SYNTHROID) 75 MCG tablet TAKE 1 TABLET EVERY DAY BEFORE BREAKFAST 07/20/22  Yes Ardith Dark, MD  metFORMIN (GLUCOPHAGE) 500 MG tablet Take 1 tablet (500 mg total) by mouth daily with breakfast. 05/27/23  Yes Ardith Dark, MD  metoprolol succinate (TOPROL-XL) 25 MG 24 hr tablet TAKE 1 TABLET EVERY DAY (CALL [phone] TO SCHEDULE APPOINTMENT FOR REFILLS) Patient taking differently: 25 mg daily. 03/23/22  Yes Chandrasekhar, Mahesh A, MD  pantoprazole (PROTONIX) 40 MG tablet TAKE 1 TABLET EVERY DAY (PLEASE REFILL WITH PRIMARY CARE PROVIDER FOR ANYMORE REFILLS) 08/03/22  Yes Ardith Dark, MD  Potassium Chloride 10 % SOLN Take 10 mEq by mouth daily. Extended release; Use 7.54ml of solution with 2 oz of water   Yes [provider]  spironolactone (ALDACTONE) 25 MG tablet Take 1 tablet (25 mg total) by mouth daily. 08/03/22  Yes Chandrasekhar, Mahesh A, MD  vitamin B-12 (CYANOCOBALAMIN) 500 MCG tablet Take 500 mcg by mouth daily.    Yes [provider]  XARELTO 20 MG TABS tablet TAKE 1 TABLET EVERY DAY  WITH BREAKFAST Patient taking differently: 20 mg daily with supper. 04/20/23  Yes Chandrasekhar, Mahesh A, MD  blood glucose meter kit and supplies Check glucose 3 times daily or PRN E11.9 11/12/21   Ardith Dark, MD    Physical Exam: Vitals:   06/02/23 1800 06/02/23 1815 06/02/23 1930 06/02/23 2015  BP: 118/61 110/74 124/88 105/75  Pulse: 93 94 (!) 108 92  Resp: 18 20 (!) 21 18  Temp:      TempSrc:      SpO2: 100% 100% 99% 100%  Weight:      Height:       Constitutional: NAD, calm, comfortable, elderly female laying in bed smiling but non-verbal Eyes: lids and conjunctivae normal ENMT: Mucous membranes are moist. POOR dentition.  Neck: normal, supple, no masses, Respiratory: clear to auscultation bilaterally, no wheezing, no crackles. Normal respiratory effort. No accessory muscle use.  Cardiovascular: Regular rate and rhythm, no murmurs / rubs / gallops. No extremity edema.  Abdomen: Soft, non-tender, non-distended Musculoskeletal: no clubbing / cyanosis. No joint deformity upper and lower extremities. Normal muscle tone.  Skin: ecchymosis to left breast fold. Several decubitus ulcers to buttocks down to subcutaneous tissue without any malodor or drainage. No obvious erythema surrounding wounds.      Neurologic: CN 2-12 grossly intact. Alert and able to track but non-verbal. Able to follow command with hand grip and opening mouth after action was demonstrated.  Psychiatric: Non-verbal. Normal mood.   Data Reviewed:  See HPI  Assessment and Plan: * Sepsis due to cellulitis (HCC) -most likely secondary to cellulitis sacral ulcer wounds as her CXR, CT A/P, UA, head CT was negative. No other obvious source except for wounds.  -administered IV vancomycin and cefepime in ED. Will switch to IV Rocephin -wound care consulted -continue to trend lactate -received a liter bolus. Will keep on continuous fluids overnight given sepsis and AKI  AKI (acute kidney injury)  (HCC) -creatinine elevated to 1.36 from baseline of 1 -has received 1L of LR bolus -keep on limited time course of IV fluids overnight  Chronic atrial fibrillation with RVR (HCC) -Continue on diltiazem infusion with goal HR <100 and SBP >95 -Continue beta-blocker -continue Xarelto  Dysphagia -son reports difficulty with swallowing pills at home -while receiving Lokelma solution in ED, pt appears to have aspirated and had oxygen desaturation down to mid-80s. Placed on oxygen supplementation briefly with improvement -obtain speech eval -keep NPO  Acute metabolic encephalopathy -secondary to dehydration and sepsis -baseline is non-verbal but can help with transfer and feeding but today unable to perform those tasks  Hyperkalemia -Lokelma x 1  Hyponatremia -on IV fluids  -follow trend  Uncontrolled type 2 diabetes mellitus with hyperglycemia, without long-term current use of insulin (HCC) -recent A1C of 9.5 and was restarted on metformin recently -place on SSI  Hyperlipidemia associated with type 2 diabetes mellitus (HCC) -no longer on statin per cardiology as pt wanted to decrease medication burden  Hypothyroidism -Continue levothyroxine  Hypertension associated with diabetes (HCC) -continue metoprolol-switch to IV dosing as pt not able swallow pills and this cannot be crushed -hold diuretic due to AKI      Advance Care Planning:   Code Status: Limited: Do not attempt resuscitation (DNR) -DNR-LIMITED -Do Not Intubate/DNI  -Confirmed with son  Consults: none  Family Communication: spoke with son over the phone   Severity of Illness: The appropriate patient status for this patient is INPATIENT. Inpatient status is judged to be reasonable and necessary in order to provide the required intensity of service to ensure the patient's safety. The patient's presenting symptoms, physical exam findings, and initial radiographic and laboratory data in the context of their chronic  comorbidities is felt to place them at high risk for further clinical deterioration. Furthermore, it is not anticipated that the patient will be medically stable for discharge from the hospital within 2 midnights of admission.   * I certify that at the point of admission it is my clinical judgment that the patient will require inpatient hospital care spanning beyond 2 midnights from the point of admission due to high intensity of service, high risk for further deterioration and high frequency of surveillance required.*  Author: Anselm Jungling, DO 06/02/2023 8:51 PM  For on call review [email or web address].

## 2023-06-02 NOTE — Assessment & Plan Note (Addendum)
-  creatinine elevated to 1.36 from baseline of 1 -has received 1L of LR bolus -keep on limited time course of IV fluids overnight

## 2023-06-02 NOTE — ED Notes (Signed)
CBG monitor reading 204. CBG monitor not crossing over into system at this time.

## 2023-06-02 NOTE — ED Notes (Signed)
Admitting provider at bedside.

## 2023-06-02 NOTE — Assessment & Plan Note (Signed)
-  Lokelma x 1

## 2023-06-02 NOTE — ED Notes (Signed)
Unable to pull blood off patient ivs. Provider notified.

## 2023-06-02 NOTE — ED Provider Notes (Signed)
Care assumed from previous provider.  See note for full HPI.  In summation 77 year old history of dementia, A-fib on Xarelto here for evaluation of altered mental status.  Son helps provide history.  Normally patient can feed herself however today was not able to.  Sometimes she is oriented to self but not very conversational at baseline.  She has some abdominal tenderness and some sacral wounds.  Also noted to be in A-fib with RVR.  Plan to follow-up on imaging and admit.  With regards to her A-fib.  Plan on recheck heart rate after IV fluids.  If improved did not start rate controlling medication however if still persistently in A-fib with RVR plan on Cardizem infusion Physical Exam  BP 110/74   Pulse 94   Temp 97.6 F (36.4 C) (Oral)   Resp 20   Ht 5' (1.524 m)   Wt 82.6 kg   SpO2 100%   BMI 35.54 kg/m   Physical Exam Vitals and nursing note reviewed.  Constitutional:      General: She is not in acute distress.    Appearance: She is well-developed. She is not ill-appearing or diaphoretic.  HENT:     Head: Atraumatic.  Eyes:     Pupils: Pupils are equal, round, and reactive to light.  Cardiovascular:     Rate and Rhythm: Normal rate and regular rhythm.  Pulmonary:     Effort: Pulmonary effort is normal. No respiratory distress.  Chest:     Comments: Soft tissue edema to Left upper chest without erythema, warmth, induration Abdominal:     General: Bowel sounds are normal. There is no distension.     Palpations: Abdomen is soft.     Tenderness: There is no abdominal tenderness.  Musculoskeletal:        General: Normal range of motion.     Cervical back: Normal range of motion and neck supple.  Skin:    General: Skin is warm and dry.  Neurological:     Mental Status: She is confused.        Procedures  .Critical Care  Performed by: Linwood Dibbles, PA-C Authorized by: Linwood Dibbles, PA-C   Critical care provider statement:    Critical care time (minutes):   35   Critical care was time spent personally by me on the following activities:  Development of treatment plan with patient or surrogate, discussions with consultants, evaluation of patient's response to treatment, examination of patient, ordering and review of laboratory studies, ordering and review of radiographic studies, ordering and performing treatments and interventions, pulse oximetry, re-evaluation of patient's condition and review of old charts  Labs Reviewed  COMPREHENSIVE METABOLIC PANEL - Abnormal; Notable for the following components:      Result Value   Sodium 132 (*)    Potassium 5.3 (*)    Chloride 92 (*)    Glucose, Bld 375 (*)    BUN 26 (*)    Creatinine, Ser 1.36 (*)    Albumin 3.0 (*)    Alkaline Phosphatase 136 (*)    GFR, Estimated 40 (*)    All other components within normal limits  CBC - Abnormal; Notable for the following components:   WBC 16.9 (*)    Hemoglobin 11.9 (*)    RDW 16.3 (*)    All other components within normal limits  URINALYSIS, ROUTINE W REFLEX MICROSCOPIC - Abnormal; Notable for the following components:   Glucose, UA 150 (*)    All other components within  normal limits  T4, FREE - Abnormal; Notable for the following components:   Free T4 1.55 (*)    All other components within normal limits  OSMOLALITY - Abnormal; Notable for the following components:   Osmolality 304 (*)    All other components within normal limits  I-STAT CHEM 8, ED - Abnormal; Notable for the following components:   Sodium 130 (*)    Potassium 5.2 (*)    Chloride 93 (*)    BUN 27 (*)    Creatinine, Ser 1.20 (*)    Glucose, Bld 381 (*)    Calcium, Ion 1.10 (*)    All other components within normal limits  CBG MONITORING, ED - Abnormal; Notable for the following components:   Glucose-Capillary 290 (*)    All other components within normal limits  I-STAT CG4 LACTIC ACID, ED - Abnormal; Notable for the following components:   Lactic Acid, Venous 3.8 (*)    All  other components within normal limits  I-STAT CG4 LACTIC ACID, ED - Abnormal; Notable for the following components:   Lactic Acid, Venous 3.1 (*)    All other components within normal limits  LIPASE, BLOOD  BRAIN NATRIURETIC PEPTIDE  AMMONIA  TSH   CT ABDOMEN PELVIS W CONTRAST  Result Date: 06/02/2023 CLINICAL DATA:  Acute nonlocalized abdominal pain EXAM: CT ABDOMEN AND PELVIS WITH CONTRAST TECHNIQUE: Multidetector CT imaging of the abdomen and pelvis was performed using the standard protocol following bolus administration of intravenous contrast. RADIATION DOSE REDUCTION: This exam was performed according to the departmental dose-optimization program which includes automated exposure control, adjustment of the mA and/or kV according to patient size and/or use of iterative reconstruction technique. CONTRAST:  75mL OMNIPAQUE IOHEXOL 350 MG/ML SOLN COMPARISON:  CT pelvis 02/07/2020 and CT chest 07/21/2020 FINDINGS: Lower chest: Nonspecific edema within the left breast and chest wall subcutaneous fat. Hepatobiliary: Cholelithiasis without evidence of cholecystitis. Unremarkable liver and biliary tree. Pancreas: Unremarkable. Spleen: Unremarkable. Adrenals/Urinary Tract: Stable right adrenal adenoma. No follow-up recommended. Additional stable right adrenal myelolipoma. No follow-up recommended. No urinary calculi or hydronephrosis. Bladder is unremarkable. Stomach/Bowel: Normal caliber large and small bowel. No bowel wall thickening. The appendix is normal.Stomach is within normal limits. Vascular/Lymphatic: No significant vascular findings are present. No enlarged abdominal or pelvic lymph nodes. Reproductive: Unremarkable. Other: No free intraperitoneal fluid or air. Musculoskeletal: No acute fracture. Partially visualized thoracic fusion hardware. IMPRESSION: 1. No acute abnormality in the abdomen or pelvis. 2. Cholelithiasis without evidence of cholecystitis. 3. Nonspecific edema within the left breast  and chest wall subcutaneous fat. Electronically Signed   By: Minerva Fester M.D.   On: 06/02/2023 16:59   CT Head Wo Contrast  Result Date: 06/02/2023 CLINICAL DATA:  Provided history: Mental status change, unknown cause. EXAM: CT HEAD WITHOUT CONTRAST TECHNIQUE: Contiguous axial images were obtained from the base of the skull through the vertex without intravenous contrast. RADIATION DOSE REDUCTION: This exam was performed according to the departmental dose-optimization program which includes automated exposure control, adjustment of the mA and/or kV according to patient size and/or use of iterative reconstruction technique. COMPARISON:  Prior head CT examinations 11/07/2021 and earlier. FINDINGS: Brain: Cerebral atrophy. Similar to the prior head CT of 11/07/2021, there is moderate lateral and third ventriculomegaly. Prominence of the sylvian fissures. Acute callosal angle. Relative crowding of the sulci at the vertex. Patchy and ill-defined hypoattenuation within the cerebral white matter, nonspecific but compatible with moderate chronic small vessel ischemic disease. There is no acute intracranial  hemorrhage. No demarcated cortical infarct. No extra-axial fluid collection. No evidence of an intracranial mass. No midline shift. Vascular: No hyperdense vessel.  Atherosclerotic calcifications. Skull: No acute fracture or aggressive osseous lesion. Tiny osteoma projecting outward from the anterior right frontal calvarium again noted. Sinuses/Orbits: No mass or acute finding within the imaged orbits. Small-volume frothy secretions within the left sphenoid sinus. IMPRESSION: 1. No evidence of an acute intracranial abnormality. 2. Moderate cerebral white matter chronic small vessel ischemic disease. 3. Moderate lateral and third ventriculomegaly, similar to the prior head CT of 11/07/2021. While this is at least partly due to cerebral volume loss, there are some imaging features which can be seen in the setting  of normal pressure hydrocephalus and superimposed NPH cannot be excluded. 4. Mild left sphenoid sinusitis. Electronically Signed   By: Jackey Loge D.O.   On: 06/02/2023 14:44   DG Chest Portable 1 View  Result Date: 06/02/2023 CLINICAL DATA:  Shortness of breath EXAM: PORTABLE CHEST 1 VIEW COMPARISON:  Chest radiograph 11/03/2021 FINDINGS: The cardiomediastinal silhouette is stable and within normal limits. There is no focal consolidation or pulmonary edema. There is no pleural effusion or pneumothorax There is no acute osseous abnormality. Thoracolumbar spine fusion hardware is again noted. IMPRESSION: Stable chest with no radiographic evidence of acute cardiopulmonary process. Electronically Signed   By: Lesia Hausen M.D.   On: 06/02/2023 14:36    ED Course / MDM   Clinical Course as of 06/02/23 1843  Wed Jun 02, 2023  1158 WBC(!): 16.9 Significant leukocytosis, lactic acidosis, suspicion for infection.  Considered abdomen infection vs sacral ulcer wounds for source [CP]  1506 Admit for sirs, possible intraabd vs sacral wounds. AMS from baseline. Can walk with assist, not very talkative at baseline, can follow commands. Son transplates [BH]  1830 Dr. Cyndia Bent with medicine to admit [BH]    Clinical Course User Index [BH] Aleene Swanner A, PA-C [CP] Olene Floss, PA-C   Care assumed from previous provider.  See note for full HPI.  In summation 77 year old history of dementia, A-fib on Xarelto here for evaluation of altered mental status.  Son helps provide history.  Normally patient can feed herself however today was not able to.  Sometimes she is oriented to self but not very conversational at baseline.  She has some abdominal tenderness and some sacral wounds.  Also noted to be in A-fib with RVR.  Plan to follow-up on imaging and admit.  With regards to her A-fib.  Plan on recheck heart rate after IV fluids.  If improved did not start rate controlling medication however if still  persistently in A-fib with RVR plan on Cardizem infusion  Labs and imaging personally viewed and interpreted  Patient reassessed.  She has received 1 L IV fluids.  Still in A-fib with RVR with rates into the 120s.  Will give Cardizem bolus and infusion.  On labs I did see that she has a glucose of 375.  Will add on serum osmolality to rule out HHS as cause of her confusion. UA reassuring that any evidence of infection Lactic 3.8--3.1per PCP note she recently restarted metformin CT AP without acute abnormality  Patient reassessed. HR improved on Cardizem drip. Will admit. Family updated  Dr. Cyndia Bent with medicine to admit  The patient appears reasonably stabilized for admission considering the current resources, flow, and capabilities available in the ED at this time, and I doubt any other [facility] requiring further screening and/or treatment in the ED prior  to admission.   Medical Decision Making Amount and/or Complexity of Data Reviewed Independent Historian:     Details: Family in room External Data Reviewed: labs, radiology, ECG and notes. Labs: ordered. Decision-making details documented in ED Course. Radiology: ordered and independent interpretation performed. Decision-making details documented in ED Course. ECG/medicine tests: ordered and independent interpretation performed. Decision-making details documented in ED Course.  Risk OTC drugs. Prescription drug management. Parenteral controlled substances. Decision regarding hospitalization. Diagnosis or treatment significantly limited by social determinants of health.         Enza Shone A, PA-C 06/02/23 1843    Melene Plan, DO 06/02/23 1845

## 2023-06-02 NOTE — Assessment & Plan Note (Addendum)
-  continue metoprolol-switch to IV dosing as pt not able swallow pills and this cannot be crushed -hold diuretic due to AKI

## 2023-06-03 ENCOUNTER — Inpatient Hospital Stay (HOSPITAL_COMMUNITY): Payer: Medicare Other

## 2023-06-03 DIAGNOSIS — L0390 Cellulitis, unspecified: Secondary | ICD-10-CM | POA: Diagnosis not present

## 2023-06-03 DIAGNOSIS — A419 Sepsis, unspecified organism: Secondary | ICD-10-CM | POA: Diagnosis not present

## 2023-06-03 LAB — CBC
HCT: 15.6 % — ABNORMAL LOW (ref 36.0–46.0)
HCT: 28 % — ABNORMAL LOW (ref 36.0–46.0)
Hemoglobin: 5 g/dL — CL (ref 12.0–15.0)
Hemoglobin: 9.1 g/dL — ABNORMAL LOW (ref 12.0–15.0)
MCH: 31.8 pg (ref 26.0–34.0)
MCH: 30.5 pg (ref 26.0–34.0)
MCHC: 32.1 g/dL (ref 30.0–36.0)
MCHC: 32.5 g/dL (ref 30.0–36.0)
MCV: 99.4 fL (ref 80.0–100.0)
MCV: 94 fL (ref 80.0–100.0)
Platelets: 355 10*3/uL (ref 150–400)
Platelets: 267 10*3/uL (ref 150–400)
RBC: 1.57 MIL/uL — ABNORMAL LOW (ref 3.87–5.11)
RBC: 2.98 MIL/uL — ABNORMAL LOW (ref 3.87–5.11)
RDW: 16.5 % — ABNORMAL HIGH (ref 11.5–15.5)
RDW: 16.7 % — ABNORMAL HIGH (ref 11.5–15.5)
WBC: 29.9 10*3/uL — ABNORMAL HIGH (ref 4.0–10.5)
WBC: 21 10*3/uL — ABNORMAL HIGH (ref 4.0–10.5)
nRBC: 0 % (ref 0.0–0.2)
nRBC: 0 % (ref 0.0–0.2)

## 2023-06-03 LAB — BASIC METABOLIC PANEL
Anion gap: 16 — ABNORMAL HIGH (ref 5–15)
BUN: 22 mg/dL (ref 8–23)
CO2: 23 mmol/L (ref 22–32)
Calcium: 8.9 mg/dL (ref 8.9–10.3)
Chloride: 96 mmol/L — ABNORMAL LOW (ref 98–111)
Creatinine, Ser: 1.27 mg/dL — ABNORMAL HIGH (ref 0.44–1.00)
GFR, Estimated: 44 mL/min — ABNORMAL LOW (ref >60)
Glucose, Bld: 158 mg/dL — ABNORMAL HIGH (ref 70–99)
Potassium: 3.8 mmol/L (ref 3.5–5.1)
Sodium: 135 mmol/L (ref 135–145)

## 2023-06-03 LAB — CBG MONITORING, ED
Glucose-Capillary: 204 mg/dL — ABNORMAL HIGH (ref 70–99)
Glucose-Capillary: 255 mg/dL — ABNORMAL HIGH (ref 70–99)
Glucose-Capillary: 138 mg/dL — ABNORMAL HIGH (ref 70–99)

## 2023-06-03 LAB — GLUCOSE, CAPILLARY
Glucose-Capillary: 163 mg/dL — ABNORMAL HIGH (ref 70–99)
Glucose-Capillary: 160 mg/dL — ABNORMAL HIGH (ref 70–99)

## 2023-06-03 LAB — LACTIC ACID, PLASMA: Lactic Acid, Venous: 1.5 mmol/L (ref 0.5–1.9)

## 2023-06-03 MED ORDER — LACTATED RINGERS IV SOLN: INTRAVENOUS | Status: AC

## 2023-06-03 MED ORDER — DILTIAZEM HCL-DEXTROSE 125-5 MG/125ML-% IV SOLN (PREMIX): 5.0000 mg/h | INTRAVENOUS | Status: DC

## 2023-06-03 MED ORDER — SODIUM CHLORIDE 0.9 % IV SOLN
3.0000 g | Freq: Three times a day (TID) | INTRAVENOUS | Status: DC
  Administered 2023-06-03 – 2023-06-06 (×9): 3 g via INTRAVENOUS
  Filled 2023-06-03 (×9): qty 8

## 2023-06-03 NOTE — Plan of Care (Signed)
Problem: Education: Goal: Ability to describe self-care measures that may prevent or decrease complications (Diabetes Survival Skills Education) will improve Outcome: Progressing Goal: Individualized Educational Video(s) Outcome: Progressing   Problem: Coping: Goal: Ability to adjust to condition or change in health will improve Outcome: Progressing   Problem: Fluid Volume: Goal: Ability to maintain a balanced intake and output will improve Outcome: Progressing   Problem: Metabolic: Goal: Ability to maintain appropriate glucose levels will improve Outcome: Progressing   Problem: Skin Integrity: Goal: Risk for impaired skin integrity will decrease Outcome: Progressing

## 2023-06-03 NOTE — ED Notes (Signed)
Family updated as to patient's status.  Updated pt's son at bedside.

## 2023-06-03 NOTE — Evaluation (Addendum)
Clinical/Bedside Swallow Evaluation Patient Details  Name: Crystal Ashley MRN: [id number] Date of Birth: Mar 27, 1946  Today's Date: 06/03/2023 Time: SLP Start Time (ACUTE ONLY): 0750 SLP Stop Time (ACUTE ONLY): 0755 SLP Time Calculation (min) (ACUTE ONLY): 5 min  Past Medical History:  Past Medical History:  Diagnosis Date   Atrial fibrillation (HCC)    Cancer (HCC)    Cataract    Diabetes mellitus without complication (HCC)    Fracture 2001   left ankle   Glaucoma    Hypertension    Thyroid disease    Past Surgical History:  Past Surgical History:  Procedure Laterality Date   APPLICATION OF ROBOTIC ASSISTANCE FOR SPINAL PROCEDURE N/A 02/26/2020   Procedure: APPLICATION OF ROBOTIC ASSISTANCE FOR SPINAL PROCEDURE;  Surgeon: Bedelia Person, MD;  Location: [facility] OR;  Service: Neurosurgery;  Laterality: N/A;   CARDIOVERSION N/A 04/08/2018   Procedure: CARDIOVERSION;  Surgeon: Yates Decamp, MD;  Location: [facility] ENDOSCOPY;  Service: Cardiovascular;  Laterality: N/A;   CARDIOVERSION N/A 05/22/2019   Procedure: CARDIOVERSION;  Surgeon: Yates Decamp, MD;  Location: [facility] ENDOSCOPY;  Service: Cardiovascular;  Laterality: N/A;   FOOT SURGERY Left    HYSTEROSCOPY     POLYPECTOMY   LUMBAR PERCUTANEOUS PEDICLE SCREW 4 LEVEL N/A 02/26/2020   Procedure: Thoracic eight to thoracic twelve posterior percutaneous instrumentation with cement augmentation and bilateral medial facetectomy for decompression at Thoracic ten-eleven;  Surgeon: Bedelia Person, MD;  Location: [facility] OR;  Service: Neurosurgery;  Laterality: N/A;   HPI:  Crystal Ashley is a 77 y.o. female who presented with altered mental status. CXR and Head CT 10/16 without acute findings. Per chart review pt is mostly bedbound, but was feeding herself and cutting up her own food pta. Pt with MBS in Oct of 2021 with concern for esophageal dysphagia and reflux to pharynx; recs for reg diet with f/u for esophageal issues as needed. Pt with medical  history significant of dementia non-verbal at baseline, HTN, COPD, hypothyroidism, T2DM, Permanent a.fib on Xarelto.    Assessment / Plan / Recommendation  Clinical Impression  Pt presents with clinical indicators of pharyngoesophageal dysphagia.  With water there was repeated swallows, gagging, gurgling, coughing, throat clearing, suspicious for regurgitation. Apparent difficulty getting bolus down with suspected swallow and reflux for pharynx oropharynx with serial straw sips. This continued with single sips.  Pt tolerated puree without difficulty.  Pt with esophageal reflux to pharynx on MBS 05/24/20.  CXR without active disease.  Reports of pill dysphagia at home per chart review.  Recommend instrumental assessment prior to initiation of PO diet.    Recommend pt remain NPO at present.  Priority oral medications could be given crushed with puree if needed.  SLP Visit Diagnosis: Dysphagia, unspecified (R13.10)    Aspiration Risk  Moderate aspiration risk    Diet Recommendation NPO    Medication Administration: Crushed with puree    Other  Recommendations      Recommendations for follow up therapy are one component of a multi-disciplinary discharge planning process, led by the attending physician.  Recommendations may be updated based on patient status, additional functional criteria and insurance authorization.  Follow up Recommendations  (TBD)      Assistance Recommended at Discharge  N/A  Functional Status Assessment  (TBD)  Frequency and Duration  (TBD)          Prognosis Prognosis for improved oropharyngeal function:  (TBD)      Swallow Study   General Date of Onset:  06/02/23 HPI: Crystal Ashley is a 77 y.o. female who presented with altered mental status. CXR and Head CT 10/16 without acute findings. Per chart review pt is mostly bedbound, but was feeding herself and cutting up her own food pta. Pt with MBS in Oct of 2021 with concern for esophageal dysphagia and  reflux to pharynx; recs for reg diet with f/u for esophageal issues as needed. 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 Type of Study: Bedside Swallow Evaluation Previous Swallow Assessment: MBS 05/24/20 with recs as above Diet Prior to this Study: NPO Temperature Spikes Noted: No Respiratory Status: Nasal cannula History of Recent Intubation: No Behavior/Cognition: Alert;Cooperative;Pleasant mood;Requires cueing Oral Cavity Assessment: Within Functional Limits Oral Care Completed by SLP: No Oral Cavity - Dentition: Missing dentition Patient Positioning: Upright in bed Baseline Vocal Quality: Not observed Volitional Cough: Cognitively unable to elicit Volitional Swallow: Unable to elicit    Oral/Motor/Sensory Function Facial ROM: Within Functional Limits Facial Symmetry: Within Functional Limits Lingual ROM: Within Functional Limits Lingual Symmetry: Within Functional Limits Lingual Strength:  (could not test) Velum: Within Functional Limits   Ice Chips Ice chips: Not tested   Thin Liquid Thin Liquid: Impaired Pharyngeal  Phase Impairments: Multiple swallows;Cough - Immediate    Nectar Thick Nectar Thick Liquid: Not tested   Honey Thick Honey Thick Liquid: Not tested   Puree Puree: Within functional limits Presentation: Spoon   Solid     Solid: Not tested      Kerrie Pleasure, MA, CCC-SLP Acute Rehabilitation Services Office: [phone] 06/03/2023,8:09 AM

## 2023-06-03 NOTE — Progress Notes (Signed)
Modified Barium Swallow Study  Patient Details  Name: Crystal Ashley MRN: [id number] Date of Birth: April 02, 1946  Today's Date: 06/03/2023  Modified Barium Swallow completed.  Full report located under Chart Review in the Imaging Section.  History of Present Illness Crystal Ashley is a 77 y.o. female who presented with altered mental status. CXR and Head CT 10/16 without acute findings. Per chart review pt is mostly bedbound, but was feeding herself and cutting up her own food pta. Pt with MBS in Oct of 2021 with concern for esophageal dysphagia and reflux to pharynx; recs for reg diet with f/u for esophageal issues as needed. 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   Clinical Impression Pt presents with a moderate oropharyngeal dysphagia which is suspected to be cognitive in nature. With consecutive sips of thin liquids, pt presents with timing deficits which cause the head of the bolus to reach the laryngeal vestibule prior to any anterior hyoid movement. This results in sensed aspiration (PAS 7), causing a strong cough response with the majority of the thin liquid bolus remaining in her oral cavity and resulting in an increasingly disorganized swallow and subsequent aspiration. With single, controlled sips of nectar and honey thick liquids, pt had no penetration/aspiration and a timely swallow iniation. As trials progressed to pureed solids, pt held majority of bolus in her oral cavity for minutes, not responding to cueing to swallow or expectorate bolus. SLP attempted to elicit a swallow response with laryngeal palpation and provided a small liquid wash using nectar thick liquids, which caused pt to elicit a swallow, although with impaired timing. This event resulted in aspiration (PAS 7) of nectar thick liquids and likely purees, although the majority of the remaining bolus of purees was removed from  pt's oral cavity using suction. Subsequent trials of single, controlled sips of nectar thick liquids did not result in any further penetration or aspiration. Suspect given pt's mentation, she may perform better with family members to provide careful hand feeding of familiar food options. Recommend she remain NPO with further assessment of ability to trial nectar thick liquids and purees given assistance with feeding from family. Factors that may increase risk of adverse event in presence of aspiration Crystal Ashley & Crystal Ashley 2021): Reduced cognitive function;Limited mobility;Frail or deconditioned;Dependence for feeding and/or oral hygiene;Aspiration of thick, dense, and/or acidic materials;Weak cough  Swallow Evaluation Recommendations Recommendations: NPO Medication Administration: Via alternative means      Crystal Ashley, M.A., CF-SLP Speech Language Pathology, Acute Rehabilitation Services  Secure Chat preferred [phone]  06/03/2023,2:38 PM

## 2023-06-03 NOTE — Progress Notes (Signed)
PROGRESS NOTE    Crystal Ashley  ONG:[id number] DOB: 1945/12/14 DOA: 06/02/2023 PCP: Ardith Dark, MD    Brief Narrative:  Crystal Ashley is a 77 y.o. 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  Pt unable to provide history as she was non-verbal which is her baseline. Son provides hx over the phone. He is her caretaker and she also has day time aid. Pt is mostly bed-bound and has not ambulate in a year and a half. Today she was not helping with transfer. Son usually puts a belt around her and she helps push up on bed rails but today was just slumped over. She also was very diaphoretic. Did not cut up her breakfast as she usually does. Appeared weak. Only ate 2 bites and appear to want to vomit.   Assessment and Plan: * Sepsis due to cellulitis (HCC) -most likely secondary to cellulitis sacral ulcer wounds as her CXR, CT A/P, UA, head CT was negative. No other obvious source except for wounds.  -administered IV vancomycin and cefepime in ED. Will switch to IV Rocephin -wound care consulted: Prevalon boots to bilateral heels Low air loss bed    AKI (acute kidney injury) (HCC) -creatinine elevated to 1.36 from baseline of 1 -IVF until no longer NPO  Chronic atrial fibrillation with RVR (HCC) -Continue on diltiazem infusion with goal HR <100 and SBP >95 -Continue beta-blocker IV -resume Xarelto when able to take PO  Dysphagia -son reports difficulty with swallowing pills at home for several months -while receiving Lokelma solution in ED, pt appears to have aspirated and had oxygen desaturation down to mid-80s. Placed on oxygen supplementation briefly with improvement -obtain speech eval -keep NPO -per son, would NOT want feeding tube -recheck x ray after aspiration  Acute metabolic encephalopathy -secondary to  dehydration/aspiration? -baseline is non-verbal but can help with transfer and feeding but today unable to perform those tasks  Hyperkalemia -resolved  Hyponatremia -trend  Uncontrolled type 2 diabetes mellitus with hyperglycemia, without long-term current use of insulin (HCC) -recent A1C of 9.5 and was restarted on metformin recently -place on SSI  Hyperlipidemia associated with type 2 diabetes mellitus (HCC) -no longer on statin per cardiology as pt wanted to decrease medication burden  Hypothyroidism -Continue levothyroxine when able to take PO safely  Hypertension associated with diabetes (HCC) -continue metoprolol-switch to IV dosing as pt not able swallow pills and this cannot be crushed -hold diuretic due to AKI      DVT prophylaxis: rivaroxaban (XARELTO) tablet 20 mg Start: 06/03/23 1700 rivaroxaban (XARELTO) tablet 20 mg    Code Status: Limited: Do not attempt resuscitation (DNR) -DNR-LIMITED -Do Not Intubate/DNI  Family Communication:   Disposition Plan:  Level of care: Telemetry Cardiac Status is: Inpatient Remains inpatient appropriate     Consultants:  none   Subjective: Awake, alert  Objective: Vitals:   06/03/23 0815 06/03/23 0900 06/03/23 1000 06/03/23 1100  BP: (!) 155/92 129/87 134/84 (!) 142/80  Pulse: (!) 112 96 (!) 102 97  Resp: (!) 24 (!) 26 (!) 26 (!) 27  Temp:   98.7 F (37.1 C)   TempSrc:   Oral   SpO2: (!) 84% 94% 98% 99%  Weight:      Height:        Intake/Output Summary (Last 24 hours) at 06/03/2023 1157 Last data filed at 06/03/2023 0443 Gross per 24 hour  Intake 1500.11 ml  Output --  Net 1500.11 ml   Filed Weights   06/02/23 1200  Weight: 82.6 kg    Examination:   General: Appearance:    Obese female in no acute distress     Lungs:     respirations unlabored  Heart:    Normal heart rate.irr   MS:   All extremities are intact.    Neurologic:   Awake, alert, non verbal but good eye contact       Data  Reviewed: I have personally reviewed following labs and imaging studies  CBC: Recent Labs  Lab 06/02/23 1104 06/02/23 1111 06/03/23 0551 06/03/23 0653  WBC 16.9*  --  29.9* 21.0*  HGB 11.9* 12.9 5.0* 9.1*  HCT 37.3 38.0 15.6* 28.0*  MCV 94.0  --  99.4 94.0  PLT 393  --  355 267   Basic Metabolic Panel: Recent Labs  Lab 06/02/23 1104 06/02/23 1111 06/03/23 0551  NA 132* 130* 135  K 5.3* 5.2* 3.8  CL 92* 93* 96*  CO2 25  --  23  GLUCOSE 375* 381* 158*  BUN 26* 27* 22  CREATININE 1.36* 1.20* 1.27*  CALCIUM 9.1  --  8.9   GFR: Estimated Creatinine Clearance: 35.3 mL/min (A) (by C-G formula based on SCr of 1.27 mg/dL (H)). Liver Function Tests: Recent Labs  Lab 06/02/23 1104  AST 25  ALT 26  ALKPHOS 136*  BILITOT 0.9  PROT 7.2  ALBUMIN 3.0*   Recent Labs  Lab 06/02/23 1104  LIPASE 24   Recent Labs  Lab 06/02/23 1106  AMMONIA 27   Coagulation Profile: No results for input(s): "INR", "PROTIME" in the last 168 hours. Cardiac Enzymes: No results for input(s): "CKTOTAL", "CKMB", "CKMBINDEX", "TROPONINI" in the last 168 hours. BNP (last 3 results) No results for input(s): "PROBNP" in the last 8760 hours. HbA1C: No results for input(s): "HGBA1C" in the last 72 hours. CBG: Recent Labs  Lab 06/02/23 1046 06/02/23 2205 06/03/23 0818  GLUCAP 290* 204* 255*   Lipid Profile: No results for input(s): "CHOL", "HDL", "LDLCALC", "TRIG", "CHOLHDL", "LDLDIRECT" in the last 72 hours. Thyroid Function Tests: Recent Labs    06/02/23 1104 06/02/23 1113  TSH  --  2.900  FREET4 1.55*  --    Anemia Panel: No results for input(s): "VITAMINB12", "FOLATE", "FERRITIN", "TIBC", "IRON", "RETICCTPCT" in the last 72 hours. Sepsis Labs: Recent Labs  Lab 06/02/23 1112 06/02/23 1410 06/02/23 2227 06/03/23 0556  LATICACIDVEN 3.8* 3.1* 1.7 1.5    No results found for this or any previous visit (from the past 240 hour(s)).       Radiology Studies: CT ABDOMEN  PELVIS W CONTRAST  Result Date: 06/02/2023 CLINICAL DATA:  Acute nonlocalized abdominal pain EXAM: CT ABDOMEN AND PELVIS WITH CONTRAST TECHNIQUE: Multidetector CT imaging of the abdomen and pelvis was performed using the standard protocol following bolus administration of intravenous contrast. RADIATION DOSE REDUCTION: This exam was performed according to the departmental dose-optimization program which includes automated exposure control, adjustment of the mA and/or kV according to patient size and/or use of iterative reconstruction technique. CONTRAST:  75mL OMNIPAQUE IOHEXOL 350 MG/ML SOLN COMPARISON:  CT pelvis 02/07/2020 and CT chest 07/21/2020 FINDINGS: Lower chest: Nonspecific edema within the left breast and chest wall subcutaneous fat. Hepatobiliary: Cholelithiasis without evidence of cholecystitis. Unremarkable liver and biliary tree. Pancreas: Unremarkable. Spleen: Unremarkable. Adrenals/Urinary Tract: Stable right adrenal adenoma. No follow-up recommended. Additional stable right adrenal myelolipoma. No follow-up recommended. No urinary calculi or  hydronephrosis. Bladder is unremarkable. Stomach/Bowel: Normal caliber large and small bowel. No bowel wall thickening. The appendix is normal.Stomach is within normal limits. Vascular/Lymphatic: No significant vascular findings are present. No enlarged abdominal or pelvic lymph nodes. Reproductive: Unremarkable. Other: No free intraperitoneal fluid or air. Musculoskeletal: No acute fracture. Partially visualized thoracic fusion hardware. IMPRESSION: 1. No acute abnormality in the abdomen or pelvis. 2. Cholelithiasis without evidence of cholecystitis. 3. Nonspecific edema within the left breast and chest wall subcutaneous fat. Electronically Signed   By: Minerva Fester M.D.   On: 06/02/2023 16:59   CT Head Wo Contrast  Result Date: 06/02/2023 CLINICAL DATA:  Provided history: Mental status change, unknown cause. EXAM: CT HEAD WITHOUT CONTRAST  TECHNIQUE: Contiguous axial images were obtained from the base of the skull through the vertex without intravenous contrast. RADIATION DOSE REDUCTION: This exam was performed according to the departmental dose-optimization program which includes automated exposure control, adjustment of the mA and/or kV according to patient size and/or use of iterative reconstruction technique. COMPARISON:  Prior head CT examinations 11/07/2021 and earlier. FINDINGS: Brain: Cerebral atrophy. Similar to the prior head CT of 11/07/2021, there is moderate lateral and third ventriculomegaly. Prominence of the sylvian fissures. Acute callosal angle. Relative crowding of the sulci at the vertex. Patchy and ill-defined hypoattenuation within the cerebral white matter, nonspecific but compatible with moderate chronic small vessel ischemic disease. There is no acute intracranial hemorrhage. No demarcated cortical infarct. No extra-axial fluid collection. No evidence of an intracranial mass. No midline shift. Vascular: No hyperdense vessel.  Atherosclerotic calcifications. Skull: No acute fracture or aggressive osseous lesion. Tiny osteoma projecting outward from the anterior right frontal calvarium again noted. Sinuses/Orbits: No mass or acute finding within the imaged orbits. Small-volume frothy secretions within the left sphenoid sinus. IMPRESSION: 1. No evidence of an acute intracranial abnormality. 2. Moderate cerebral white matter chronic small vessel ischemic disease. 3. Moderate lateral and third ventriculomegaly, similar to the prior head CT of 11/07/2021. While this is at least partly due to cerebral volume loss, there are some imaging features which can be seen in the setting of normal pressure hydrocephalus and superimposed NPH cannot be excluded. 4. Mild left sphenoid sinusitis. Electronically Signed   By: Jackey Loge D.O.   On: 06/02/2023 14:44   DG Chest Portable 1 View  Result Date: 06/02/2023 CLINICAL DATA:  Shortness  of breath EXAM: PORTABLE CHEST 1 VIEW COMPARISON:  Chest radiograph 11/03/2021 FINDINGS: The cardiomediastinal silhouette is stable and within normal limits. There is no focal consolidation or pulmonary edema. There is no pleural effusion or pneumothorax There is no acute osseous abnormality. Thoracolumbar spine fusion hardware is again noted. IMPRESSION: Stable chest with no radiographic evidence of acute cardiopulmonary process. Electronically Signed   By: Lesia Hausen M.D.   On: 06/02/2023 14:36        Scheduled Meds:  dorzolamide-timolol  1 drop Both Eyes BID   insulin aspart  0-9 Units Subcutaneous TID PC & HS   latanoprost  1 drop Both Eyes QHS   levothyroxine  75 mcg Oral Q0600   metoprolol tartrate  1.3 mg Intravenous Q6H   rivaroxaban  20 mg Oral Q supper   Continuous Infusions:  cefTRIAXone (ROCEPHIN)  IV Stopped (06/03/23 0443)   diltiazem (CARDIZEM) infusion 5 mg/hr (06/03/23 0124)     LOS: 1 day    Time spent: 45 minutes spent on chart review, discussion with nursing staff, consultants, updating family and interview/physical exam; more than 50% of that  time was spent in counseling and/or coordination of care.    Joseph Art, DO [facility] Available via Epic secure chat 7am-7pm After these hours, please refer to coverage provider listed on [email or web address] 06/03/2023, 11:57 AM

## 2023-06-03 NOTE — Consult Note (Signed)
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  Noted to be present for four weeks and being followed by home health for wound care  Patient has dementia, is nonverbal and son reports altered mental status.  Meets sepsis criteria and presents with dehydration.  Wound type: full thickness pressure injuries Pressure Injury POA: Yes Measurement: Right ischium:  1 cm x 1 cm x 0.3 cm  Left upper buttocks:  3 scattered nonintact wounds:  3 cm x 1 cm x 0.3 cm and 2 areas that are 1 cm x 1 cm x 0.3 cm  Right upper buttocks 3 scattered nonintact wounds:  All are 1 cm x 1 cm x 0.3 cm  (Buttocks' wounds are mirror image on each side)  Wound bed: red and moist  white fibrin in deepest portion of wound  (photo in chart)  Drainage (amount, consistency, odor) minimal serosanguinous  No odor noted Periwound: Darkened epithelium to periwound.  No warmth or erythema noted.  Dressing procedure/placement/frequency: Cleanse wounds to right ischium and bilateral gluteal folds with NS and pat dry.  Apply alginate dressing to open areas (LAWSON # [id number]) and cover with foam dressings.  Change daily.  Prevalon boots to bilateral heels Low air loss bed  Will not follow at this time.  Please re-consult if needed.  Mike Gip MSN, RN, FNP-BC CWON Wound, Ostomy, Continence Nurse Outpatient [facility] [phone] Pager [phone]

## 2023-06-03 NOTE — ED Notes (Signed)
CBC redrawn to verify recent lab results

## 2023-06-03 NOTE — ED Notes (Signed)
ED TO INPATIENT HANDOFF REPORT  ED Nurse Name and Phone #: Morrie Sheldon 26   S Name/Age/Gender Crystal Ashley 77 y.o. female Room/Bed: 037C/037C  Code Status   Code Status: Limited: Do not attempt resuscitation (DNR) -DNR-LIMITED -Do Not Intubate/DNI   Home/SNF/Other Home Patient oriented to: self Is this baseline? Yes   Triage Complete: Triage complete  Chief Complaint Sepsis [facility]) [A41.9]  Triage Note [facility] from home. Family reports AMS since yesterday morning. Reports some confusion at baseline. No focal neuro deficits. Glucose 385.    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   Amiodarone     Nausea    Level of Care/Admitting Diagnosis ED Disposition     ED Disposition  Admit   Condition  --   Comment  Hospital [facility]: [facility] [[id number]]  Level of Care: Telemetry Cardiac [[id number]]  May admit patient to [facility] or [facility] if equivalent level of care is available:: No  Covid Evaluation: Asymptomatic - no recent exposure (last 10 days) testing not required  Diagnosis: Sepsis [facility]) [[id number]]  Admitting Physician: Anselm Jungling [[id number]]  Attending Physician: Anselm Jungling [[id number]]  Certification:: I certify this patient will need inpatient services for at least 2 midnights  Expected Medical Readiness: 06/05/2023          B Medical/Surgery History Past Medical History:  Diagnosis Date   Atrial fibrillation (HCC)    Cancer (HCC)    Cataract    Diabetes mellitus without complication (HCC)    Fracture 2001   left ankle   Glaucoma    Hypertension    Thyroid disease    Past Surgical History:  Procedure Laterality Date   APPLICATION OF ROBOTIC ASSISTANCE FOR SPINAL PROCEDURE N/A 02/26/2020   Procedure: APPLICATION OF ROBOTIC ASSISTANCE FOR SPINAL PROCEDURE;  Surgeon: Bedelia Person, MD;  Location: [facility] OR;   Service: Neurosurgery;  Laterality: N/A;   CARDIOVERSION N/A 04/08/2018   Procedure: CARDIOVERSION;  Surgeon: Yates Decamp, MD;  Location[facility] ENDOSCOPY;  Service: Cardiovascular;  Laterality: N/A;   CARDIOVERSION N/A 05/22/2019   Procedure: CARDIOVERSION;  Surgeon: Yates Decamp, MD;  Location: [facility] ENDOSCOPY;  Service: Cardiovascular;  Laterality: N/A;   FOOT SURGERY Left    HYSTEROSCOPY     POLYPECTOMY   LUMBAR PERCUTANEOUS PEDICLE SCREW 4 LEVEL N/A 02/26/2020   Procedure: Thoracic eight to thoracic twelve posterior percutaneous instrumentation with cement augmentation and bilateral medial facetectomy for decompression at Thoracic ten-eleven;  Surgeon: Bedelia Person, MD;  Location: [facility] OR;  Service: Neurosurgery;  Laterality: N/A;     A IV Location/Drains/Wounds Patient Lines/Drains/Airways Status     Active Line/Drains/Airways     Name Placement date Placement time Site Days   Peripheral IV 06/02/23 20 G 1.88" Left Forearm 06/02/23  1048  Forearm  1   Peripheral IV 06/02/23 22 G 2.5" Anterior;Right Forearm 06/02/23  1836  Forearm  1   Pressure Injury 06/02/23 Buttocks 06/02/23  1400  -- 1            Intake/Output Last 24 hours  Intake/Output Summary (Last 24 hours) at 06/03/2023 1242 Last data filed at 06/03/2023 1241 Gross per 24 hour  Intake 2400.11 ml  Output --  Net 2400.11 ml    Labs/Imaging Results for orders placed or performed during the hospital encounter of 06/02/23 (from the past 48 hour(s))  POC CBG, ED  Status: Abnormal   Collection Time: 06/02/23 10:46 AM  Result Value Ref Range   Glucose-Capillary 290 (H) 70 - 99 mg/dL    Comment: Glucose reference range applies only to samples taken after fasting for at least 8 hours.  Lipase, blood     Status: None   Collection Time: 06/02/23 11:04 AM  Result Value Ref Range   Lipase 24 11 - 51 U/L    Comment: Performed at [facility], [address], [place], [place] [address]  Comprehensive metabolic panel      Status: Abnormal   Collection Time: 06/02/23 11:04 AM  Result Value Ref Range   Sodium 132 (L) 135 - 145 mmol/L   Potassium 5.3 (H) 3.5 - 5.1 mmol/L   Chloride 92 (L) 98 - 111 mmol/L   CO2 25 22 - 32 mmol/L   Glucose, Bld 375 (H) 70 - 99 mg/dL    Comment: Glucose reference range applies only to samples taken after fasting for at least 8 hours.   BUN 26 (H) 8 - 23 mg/dL   Creatinine, Ser 6.04 (H) 0.44 - 1.00 mg/dL   Calcium 9.1 8.9 - 54.0 mg/dL   Total Protein 7.2 6.5 - 8.1 g/dL   Albumin 3.0 (L) 3.5 - 5.0 g/dL   AST 25 15 - 41 U/L   ALT 26 0 - 44 U/L   Alkaline Phosphatase 136 (H) 38 - 126 U/L   Total Bilirubin 0.9 0.3 - 1.2 mg/dL   GFR, Estimated 40 (L) >60 mL/min    Comment: (NOTE) Calculated using the CKD-EPI Creatinine Equation (2021)    Anion gap 15 5 - 15    Comment: Performed at [facility], [address] [place], [place] [address]  CBC     Status: Abnormal   Collection Time: 06/02/23 11:04 AM  Result Value Ref Range   WBC 16.9 (H) 4.0 - 10.5 K/uL   RBC 3.97 3.87 - 5.11 MIL/uL   Hemoglobin 11.9 (L) 12.0 - 15.0 g/dL   HCT 14.7 82.9 - 56.2 %   MCV 94.0 80.0 - 100.0 fL   MCH 30.0 26.0 - 34.0 pg   MCHC 31.9 30.0 - 36.0 g/dL   RDW 13.0 (H) 86.5 - 78.4 %   Platelets 393 150 - 400 K/uL   nRBC 0.0 0.0 - 0.2 %    Comment: Performed at [facility], [address], [place], [place] [address]  T4, free     Status: Abnormal   Collection Time: 06/02/23 11:04 AM  Result Value Ref Range   Free T4 1.55 (H) 0.61 - 1.12 ng/dL    Comment: (NOTE) Biotin ingestion may interfere with free T4 tests. If the results are inconsistent with the TSH level, previous test results, or the clinical presentation, then consider biotin interference. If needed, order repeat testing after stopping biotin. Performed at [facility], [address] [place], [place] [address]   Osmolality     Status: Abnormal   Collection Time: 06/02/23 11:04 AM  Result Value Ref Range    Osmolality 304 (H) 275 - 295 mOsm/kg    Comment: Performed at [facility], [address], [place], [place] [address]  Brain natriuretic peptide     Status: None   Collection Time: 06/02/23 11:05 AM  Result Value Ref Range   B Natriuretic Peptide 88.5 0.0 - 100.0 pg/mL    Comment: Performed at [facility], [address],  [place], [place] [address]  Ammonia     Status: None   Collection Time: 06/02/23 11:06 AM  Result Value Ref Range   Ammonia 27 9 - 35 umol/L    Comment: HEMOLYSIS AT THIS LEVEL MAY AFFECT RESULT Performed at [facility], [address] [place], [place] [address]   I-stat chem 8, ED (not at [facility], [facility] or [facility])     Status: Abnormal   Collection Time: 06/02/23 11:11 AM  Result Value Ref Range   Sodium 130 (L) 135 - 145 mmol/L   Potassium 5.2 (H) 3.5 - 5.1 mmol/L   Chloride 93 (L) 98 - 111 mmol/L   BUN 27 (H) 8 - 23 mg/dL   Creatinine, Ser 0.98 (H) 0.44 - 1.00 mg/dL   Glucose, Bld 119 (H) 70 - 99 mg/dL    Comment: Glucose reference range applies only to samples taken after fasting for at least 8 hours.   Calcium, Ion 1.10 (L) 1.15 - 1.40 mmol/L   TCO2 27 22 - 32 mmol/L   Hemoglobin 12.9 12.0 - 15.0 g/dL   HCT 14.7 82.9 - 56.2 %  I-Stat CG4 Lactic Acid     Status: Abnormal   Collection Time: 06/02/23 11:12 AM  Result Value Ref Range   Lactic Acid, Venous 3.8 (HH) 0.5 - 1.9 mmol/L   Comment NOTIFIED PHYSICIAN   TSH     Status: None   Collection Time: 06/02/23 11:13 AM  Result Value Ref Range   TSH 2.900 0.350 - 4.500 uIU/mL    Comment: Performed by a 3rd Generation assay with a functional sensitivity of <=0.01 uIU/mL. Performed at [facility], [address] [place], [place] [address]   Urinalysis, Routine w reflex microscopic -Urine, Clean Catch     Status: Abnormal   Collection Time: 06/02/23 11:28 AM  Result Value Ref Range   Color, Urine YELLOW YELLOW   APPearance CLEAR CLEAR   Specific Gravity, Urine 1.019 1.005 - 1.030   pH  6.0 5.0 - 8.0   Glucose, UA 150 (A) NEGATIVE mg/dL   Hgb urine dipstick NEGATIVE NEGATIVE   Bilirubin Urine NEGATIVE NEGATIVE   Ketones, ur NEGATIVE NEGATIVE mg/dL   Protein, ur NEGATIVE NEGATIVE mg/dL   Nitrite NEGATIVE NEGATIVE   Leukocytes,Ua NEGATIVE NEGATIVE    Comment: Performed at [facility], [address] [place], [place] [address]  I-Stat CG4 Lactic Acid     Status: Abnormal   Collection Time: 06/02/23  2:10 PM  Result Value Ref Range   Lactic Acid, Venous 3.1 (HH) 0.5 - 1.9 mmol/L   Comment NOTIFIED PHYSICIAN   CBG monitoring, ED     Status: Abnormal   Collection Time: 06/02/23 10:05 PM  Result Value Ref Range   Glucose-Capillary 204 (H) 70 - 99 mg/dL    Comment: Glucose reference range applies only to samples taken after fasting for at least 8 hours.  Lactic acid, plasma     Status: None   Collection Time: 06/02/23 10:27 PM  Result Value Ref Range   Lactic Acid, Venous 1.7 0.5 - 1.9 mmol/L    Comment: Performed at [facility], [address], [place], [place] [address]  CBC     Status: Abnormal   Collection Time: 06/03/23  5:51 AM  Result Value Ref Range   WBC 29.9 (H) 4.0 - 10.5 K/uL   RBC 1.57 (L) 3.87 - 5.11 MIL/uL   Hemoglobin 5.0 (LL) 12.0 - 15.0 g/dL  Comment: REPEATED TO VERIFY THIS CRITICAL RESULT HAS VERIFIED AND BEEN CALLED TO APRIL SMITH RN BY BERTRAM TAYLOR ON 10 17 2024 AT 0644, AND HAS BEEN READ BACK.     HCT 15.6 (L) 36.0 - 46.0 %   MCV 99.4 80.0 - 100.0 fL   MCH 31.8 26.0 - 34.0 pg   MCHC 32.1 30.0 - 36.0 g/dL   RDW 13.0 (H) 86.5 - 78.4 %   Platelets 355 150 - 400 K/uL   nRBC 0.0 0.0 - 0.2 %    Comment: Performed at [facility], [address], [place], [place] [address]  Basic metabolic panel     Status: Abnormal   Collection Time: 06/03/23  5:51 AM  Result Value Ref Range   Sodium 135 135 - 145 mmol/L   Potassium 3.8 3.5 - 5.1 mmol/L   Chloride 96 (L) 98 - 111 mmol/L   CO2 23 22 - 32 mmol/L   Glucose, Bld 158 (H)  70 - 99 mg/dL    Comment: Glucose reference range applies only to samples taken after fasting for at least 8 hours.   BUN 22 8 - 23 mg/dL   Creatinine, Ser 5.28 (H) 0.44 - 1.00 mg/dL   Calcium 8.9 8.9 - 41.3 mg/dL   GFR, Estimated 44 (L) >60 mL/min    Comment: (NOTE) Calculated using the CKD-EPI Creatinine Equation (2021)    Anion gap 16 (H) 5 - 15    Comment: Performed at [facility], [address] [place], [place] [address]  Lactic acid, plasma     Status: None   Collection Time: 06/03/23  5:56 AM  Result Value Ref Range   Lactic Acid, Venous 1.5 0.5 - 1.9 mmol/L    Comment: Performed at [facility], [address], [place], [place] [address]  CBC     Status: Abnormal   Collection Time: 06/03/23  6:53 AM  Result Value Ref Range   WBC 21.0 (H) 4.0 - 10.5 K/uL   RBC 2.98 (L) 3.87 - 5.11 MIL/uL   Hemoglobin 9.1 (L) 12.0 - 15.0 g/dL    Comment: REPEATED TO VERIFY NEW SAMPLE NOTIFIED Reona Zendejas RN AT [phone] BY ZBEECH    HCT 28.0 (L) 36.0 - 46.0 %   MCV 94.0 80.0 - 100.0 fL   MCH 30.5 26.0 - 34.0 pg   MCHC 32.5 30.0 - 36.0 g/dL   RDW 74.2 (H) 59.5 - 63.8 %   Platelets 267 150 - 400 K/uL   nRBC 0.0 0.0 - 0.2 %    Comment: Performed at [facility], [address] [place], [place] [address]  CBG monitoring, ED     Status: Abnormal   Collection Time: 06/03/23  8:18 AM  Result Value Ref Range   Glucose-Capillary 255 (H) 70 - 99 mg/dL    Comment: Glucose reference range applies only to samples taken after fasting for at least 8 hours.  CBG monitoring, ED     Status: Abnormal   Collection Time: 06/03/23 12:12 PM  Result Value Ref Range   Glucose-Capillary 138 (H) 70 - 99 mg/dL    Comment: Glucose reference range applies only to samples taken after fasting for at least 8 hours.   CT ABDOMEN PELVIS W CONTRAST  Result Date: 06/02/2023 CLINICAL DATA:  Acute nonlocalized abdominal pain EXAM: CT ABDOMEN AND PELVIS WITH CONTRAST TECHNIQUE:  Multidetector CT imaging of the abdomen and pelvis was performed using the standard protocol  following bolus administration of intravenous contrast. RADIATION DOSE REDUCTION: This exam was performed according to the departmental dose-optimization program which includes automated exposure control, adjustment of the mA and/or kV according to patient size and/or use of iterative reconstruction technique. CONTRAST:  75mL OMNIPAQUE IOHEXOL 350 MG/ML SOLN COMPARISON:  CT pelvis 02/07/2020 and CT chest 07/21/2020 FINDINGS: Lower chest: Nonspecific edema within the left breast and chest wall subcutaneous fat. Hepatobiliary: Cholelithiasis without evidence of cholecystitis. Unremarkable liver and biliary tree. Pancreas: Unremarkable. Spleen: Unremarkable. Adrenals/Urinary Tract: Stable right adrenal adenoma. No follow-up recommended. Additional stable right adrenal myelolipoma. No follow-up recommended. No urinary calculi or hydronephrosis. Bladder is unremarkable. Stomach/Bowel: Normal caliber large and small bowel. No bowel wall thickening. The appendix is normal.Stomach is within normal limits. Vascular/Lymphatic: No significant vascular findings are present. No enlarged abdominal or pelvic lymph nodes. Reproductive: Unremarkable. Other: No free intraperitoneal fluid or air. Musculoskeletal: No acute fracture. Partially visualized thoracic fusion hardware. IMPRESSION: 1. No acute abnormality in the abdomen or pelvis. 2. Cholelithiasis without evidence of cholecystitis. 3. Nonspecific edema within the left breast and chest wall subcutaneous fat. Electronically Signed   By: Minerva Fester M.D.   On: 06/02/2023 16:59   CT Head Wo Contrast  Result Date: 06/02/2023 CLINICAL DATA:  Provided history: Mental status change, unknown cause. EXAM: CT HEAD WITHOUT CONTRAST TECHNIQUE: Contiguous axial images were obtained from the base of the skull through the vertex without intravenous contrast. RADIATION DOSE REDUCTION: This  exam was performed according to the departmental dose-optimization program which includes automated exposure control, adjustment of the mA and/or kV according to patient size and/or use of iterative reconstruction technique. COMPARISON:  Prior head CT examinations 11/07/2021 and earlier. FINDINGS: Brain: Cerebral atrophy. Similar to the prior head CT of 11/07/2021, there is moderate lateral and third ventriculomegaly. Prominence of the sylvian fissures. Acute callosal angle. Relative crowding of the sulci at the vertex. Patchy and ill-defined hypoattenuation within the cerebral white matter, nonspecific but compatible with moderate chronic small vessel ischemic disease. There is no acute intracranial hemorrhage. No demarcated cortical infarct. No extra-axial fluid collection. No evidence of an intracranial mass. No midline shift. Vascular: No hyperdense vessel.  Atherosclerotic calcifications. Skull: No acute fracture or aggressive osseous lesion. Tiny osteoma projecting outward from the anterior right frontal calvarium again noted. Sinuses/Orbits: No mass or acute finding within the imaged orbits. Small-volume frothy secretions within the left sphenoid sinus. IMPRESSION: 1. No evidence of an acute intracranial abnormality. 2. Moderate cerebral white matter chronic small vessel ischemic disease. 3. Moderate lateral and third ventriculomegaly, similar to the prior head CT of 11/07/2021. While this is at least partly due to cerebral volume loss, there are some imaging features which can be seen in the setting of normal pressure hydrocephalus and superimposed NPH cannot be excluded. 4. Mild left sphenoid sinusitis. Electronically Signed   By: Jackey Loge D.O.   On: 06/02/2023 14:44   DG Chest Portable 1 View  Result Date: 06/02/2023 CLINICAL DATA:  Shortness of breath EXAM: PORTABLE CHEST 1 VIEW COMPARISON:  Chest radiograph 11/03/2021 FINDINGS: The cardiomediastinal silhouette is stable and within normal limits.  There is no focal consolidation or pulmonary edema. There is no pleural effusion or pneumothorax There is no acute osseous abnormality. Thoracolumbar spine fusion hardware is again noted. IMPRESSION: Stable chest with no radiographic evidence of acute cardiopulmonary process. Electronically Signed   By: Lesia Hausen M.D.   On: 06/02/2023 14:36    Pending Labs Unresulted Labs (From admission, onward)  Start     Ordered   06/04/23 0500  CBC  Tomorrow morning,   R        06/03/23 1158   06/04/23 0500  Basic metabolic panel  Tomorrow morning,   R        06/03/23 1158            Vitals/Pain Today's Vitals   06/03/23 0815 06/03/23 0900 06/03/23 1000 06/03/23 1100  BP: (!) 155/92 129/87 134/84 (!) 142/80  Pulse: (!) 112 96 (!) 102 97  Resp: (!) 24 (!) 26 (!) 26 (!) 27  Temp:   98.7 F (37.1 C)   TempSrc:   Oral   SpO2: (!) 84% 94% 98% 99%  Weight:      Height:        Isolation Precautions No active isolations  Medications Medications  diltiazem (CARDIZEM) 1 mg/mL load via infusion 10 mg (10 mg Intravenous Bolus from Bag 06/02/23 1644)    And  diltiazem (CARDIZEM) 125 mg in dextrose 5% 125 mL (1 mg/mL) infusion (5 mg/hr Intravenous Rate/Dose Verify 06/03/23 0124)  insulin aspart (novoLOG) injection 0-9 Units (1 Units Subcutaneous Given 06/03/23 1225)  levothyroxine (SYNTHROID) tablet 75 mcg (75 mcg Oral Not Given 06/03/23 0527)  dorzolamide-timolol (COSOPT) 2-0.5 % ophthalmic solution 1 drop (1 drop Both Eyes Given 06/03/23 1057)  latanoprost (XALATAN) 0.005 % ophthalmic solution 1 drop (1 drop Both Eyes Given 06/02/23 2249)  metoprolol tartrate (LOPRESSOR) injection 1.3 mg (1.3 mg Intravenous Given 06/03/23 1225)  cefTRIAXone (ROCEPHIN) 1 g in sodium chloride 0.9 % 100 mL IVPB (0 g Intravenous Stopped 06/03/23 0443)  lactated ringers infusion (0 mLs Intravenous Stopped 06/03/23 1228)  rivaroxaban (XARELTO) tablet 20 mg (has no administration in time range)  lactated ringers  infusion (0 mLs Intravenous Stopped 06/03/23 1241)  lactated ringers bolus 1,000 mL (0 mLs Intravenous Stopped 06/02/23 1405)  iohexol (OMNIPAQUE) 350 MG/ML injection 75 mL (75 mLs Intravenous Contrast Given 06/02/23 1444)  ceFEPIme (MAXIPIME) 2 g in sodium chloride 0.9 % 100 mL IVPB (0 g Intravenous Stopped 06/02/23 1917)  vancomycin (VANCOREADY) IVPB 1500 mg/300 mL (0 mg Intravenous Stopped 06/02/23 2136)  sodium zirconium cyclosilicate (LOKELMA) packet 5 g (5 g Oral Given 06/02/23 2031)    Mobility non-ambulatory     Focused Assessments See chart   R Recommendations: See Admitting Provider Note  Report given to:   Additional Notes: see chart

## 2023-06-03 NOTE — ED Notes (Signed)
Speech Therapist at bedside for swallow evaluation.

## 2023-06-03 NOTE — Progress Notes (Signed)
SLP Cancellation Note  Patient Details Name: Crystal Ashley MRN: [id number] DOB: 17-Feb-1946   Cancelled treatment:        Pt unable to transit to radiology for swallow study 2/2 desaturation.  MBSS planned for 9 am has been cancelled.  SLP will re-attempt when pt is medically appropriate as radiology and SLP schedules permit.                                                                                                Kerrie Pleasure, MA, CCC-SLP Acute Rehabilitation Services Office: [phone] 06/03/2023, 9:22 AM

## 2023-06-03 NOTE — Progress Notes (Signed)
Pharmacy Antibiotic Note  Crystal Ashley is a 77 y.o. female admitted on 06/02/2023 with aspiration pneumonia.  Pharmacy has been consulted for Unasyn dosing.  Plan: Unasyn 3 g q8h Follow cultures as applicable and LOT Pharmacy will sign off, please reconsult as needed   Height: 5' (152.4 cm) Weight: 79.5 kg (175 lb 4.3 oz) IBW/kg (Calculated) : 45.5  Temp (24hrs), Avg:99.1 F (37.3 C), Min:98.4 F (36.9 C), Max:100.3 F (37.9 C)  Recent Labs  Lab 06/02/23 1104 06/02/23 1111 06/02/23 1112 06/02/23 1410 06/02/23 2227 06/03/23 0551 06/03/23 0556 06/03/23 0653  WBC 16.9*  --   --   --   --  29.9*  --  21.0*  CREATININE 1.36* 1.20*  --   --   --  1.27*  --   --   LATICACIDVEN  --   --  3.8* 3.1* 1.7  --  1.5  --     Estimated Creatinine Clearance: 34.6 mL/min (A) (by C-G formula based on SCr of 1.27 mg/dL (H)).    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   Amiodarone     Nausea    Antimicrobials this admission: Vancomycin 10/16 x1 Cefepime 10/16 x1 Ceftriaxone 10/17 x1  Dose adjustments this admission: N/a  Microbiology results: None at this time   Thank you for allowing pharmacy to be a part of this patient's care.  Cedric Fishman 06/03/2023 4:57 PM

## 2023-06-03 NOTE — ED Notes (Signed)
Patient transported radiology.

## 2023-06-04 DIAGNOSIS — A419 Sepsis, unspecified organism: Secondary | ICD-10-CM | POA: Diagnosis not present

## 2023-06-04 DIAGNOSIS — L0390 Cellulitis, unspecified: Secondary | ICD-10-CM | POA: Diagnosis not present

## 2023-06-04 LAB — BASIC METABOLIC PANEL
Anion gap: 12 (ref 5–15)
BUN: 20 mg/dL (ref 8–23)
CO2: 25 mmol/L (ref 22–32)
Calcium: 8.4 mg/dL — ABNORMAL LOW (ref 8.9–10.3)
Chloride: 98 mmol/L (ref 98–111)
Creatinine, Ser: 1.32 mg/dL — ABNORMAL HIGH (ref 0.44–1.00)
GFR, Estimated: 42 mL/min — ABNORMAL LOW (ref >60)
Glucose, Bld: 160 mg/dL — ABNORMAL HIGH (ref 70–99)
Potassium: 3.8 mmol/L (ref 3.5–5.1)
Sodium: 135 mmol/L (ref 135–145)

## 2023-06-04 LAB — CBC
HCT: 26.9 % — ABNORMAL LOW (ref 36.0–46.0)
Hemoglobin: 8.7 g/dL — ABNORMAL LOW (ref 12.0–15.0)
MCH: 30.4 pg (ref 26.0–34.0)
MCHC: 32.3 g/dL (ref 30.0–36.0)
MCV: 94.1 fL (ref 80.0–100.0)
Platelets: 252 10*3/uL (ref 150–400)
RBC: 2.86 MIL/uL — ABNORMAL LOW (ref 3.87–5.11)
RDW: 16.8 % — ABNORMAL HIGH (ref 11.5–15.5)
WBC: 12.7 10*3/uL — ABNORMAL HIGH (ref 4.0–10.5)
nRBC: 0 % (ref 0.0–0.2)

## 2023-06-04 LAB — GLUCOSE, CAPILLARY
Glucose-Capillary: 145 mg/dL — ABNORMAL HIGH (ref 70–99)
Glucose-Capillary: 241 mg/dL — ABNORMAL HIGH (ref 70–99)
Glucose-Capillary: 214 mg/dL — ABNORMAL HIGH (ref 70–99)
Glucose-Capillary: 283 mg/dL — ABNORMAL HIGH (ref 70–99)
Glucose-Capillary: 406 mg/dL — ABNORMAL HIGH (ref 70–99)
Glucose-Capillary: 422 mg/dL — ABNORMAL HIGH (ref 70–99)
Glucose-Capillary: 380 mg/dL — ABNORMAL HIGH (ref 70–99)
Glucose-Capillary: 317 mg/dL — ABNORMAL HIGH (ref 70–99)

## 2023-06-04 MED ORDER — INSULIN ASPART 100 UNIT/ML IJ SOLN
0.0000 [IU] | Freq: Every day | INTRAMUSCULAR | Status: DC
  Administered 2023-06-05 – 2023-06-09 (×2): 2 [IU] via SUBCUTANEOUS

## 2023-06-04 MED ORDER — METOPROLOL TARTRATE 12.5 MG HALF TABLET
12.5000 mg | ORAL_TABLET | Freq: Two times a day (BID) | ORAL | Status: DC
  Administered 2023-06-04 (×2): 12.5 mg via ORAL
  Filled 2023-06-04 (×3): qty 1

## 2023-06-04 MED ORDER — INSULIN ASPART 100 UNIT/ML IJ SOLN
0.0000 [IU] | Freq: Three times a day (TID) | INTRAMUSCULAR | Status: DC
  Administered 2023-06-04: 11 [IU] via SUBCUTANEOUS
  Administered 2023-06-05: 4 [IU] via SUBCUTANEOUS
  Administered 2023-06-05: 11 [IU] via SUBCUTANEOUS
  Administered 2023-06-05: 7 [IU] via SUBCUTANEOUS
  Administered 2023-06-06 (×3): 4 [IU] via SUBCUTANEOUS
  Administered 2023-06-07: 15 [IU] via SUBCUTANEOUS
  Administered 2023-06-07: 11 [IU] via SUBCUTANEOUS
  Administered 2023-06-07: 4 [IU] via SUBCUTANEOUS
  Administered 2023-06-08 (×2): 7 [IU] via SUBCUTANEOUS
  Administered 2023-06-08: 3 [IU] via SUBCUTANEOUS
  Administered 2023-06-09: 4 [IU] via SUBCUTANEOUS
  Administered 2023-06-09 (×2): 7 [IU] via SUBCUTANEOUS
  Administered 2023-06-10 – 2023-06-11 (×4): 4 [IU] via SUBCUTANEOUS
  Administered 2023-06-11: 7 [IU] via SUBCUTANEOUS

## 2023-06-04 MED ORDER — METOPROLOL SUCCINATE ER 25 MG PO TB24: 25.0000 mg | ORAL_TABLET | Freq: Every day | ORAL | Status: DC

## 2023-06-04 MED ORDER — INSULIN ASPART 100 UNIT/ML IJ SOLN
20.0000 [IU] | Freq: Once | INTRAMUSCULAR | Status: AC
  Administered 2023-06-04: 20 [IU] via SUBCUTANEOUS

## 2023-06-04 MED ORDER — RIVAROXABAN 15 MG PO TABS
15.0000 mg | ORAL_TABLET | Freq: Every day | ORAL | Status: DC
  Administered 2023-06-04: 15 mg via ORAL
  Filled 2023-06-04: qty 1

## 2023-06-04 MED ORDER — INSULIN ASPART 100 UNIT/ML IJ SOLN
10.0000 [IU] | Freq: Once | INTRAMUSCULAR | Status: AC
  Administered 2023-06-04: 10 [IU] via SUBCUTANEOUS

## 2023-06-04 MED ORDER — LACTATED RINGERS IV SOLN: INTRAVENOUS | Status: DC

## 2023-06-04 NOTE — Plan of Care (Signed)
  Problem: Respiratory: Goal: Will regain and/or maintain adequate ventilation Outcome: Progressing   Problem: Elimination: Goal: Will not experience complications related to bowel motility Outcome: Progressing Goal: Will not experience complications related to urinary retention Outcome: Progressing   Problem: Pain Managment: Goal: General experience of comfort will improve Outcome: Progressing   Problem: Nutritional: Goal: Maintenance of adequate nutrition will improve Outcome: Not Progressing   Problem: Skin Integrity: Goal: Risk for impaired skin integrity will decrease Outcome: Not Progressing   Problem: Education: Goal: Knowledge of General Education information will improve Description: Including pain rating scale, medication(s)/side effects and non-pharmacologic comfort measures Outcome: Not Progressing   Problem: Clinical Measurements: Goal: Will remain free from infection Outcome: Not Progressing Goal: Cardiovascular complication will be avoided Outcome: Not Progressing

## 2023-06-04 NOTE — Progress Notes (Signed)
Speech Language Pathology Treatment: Dysphagia  Patient Details Name: Crystal Ashley MRN: [id number] DOB: [birth year] Today's Date: 06/04/2023 Time: 3474-2595 SLP Time Calculation (min) (ACUTE ONLY): 15 min  Assessment / Plan / Recommendation Clinical Impression  Pt's performance on MBS reviewed with pt's son, who verbalizes understanding. Observed pt with trials of single, controlled sips via straw of nectar thick liquids and teaspoons of purees with mild oral holding and improved response to cueing to initiate a swallow this date. Suspect she will continue to benefit from careful hand feeding from family. Provided education regarding use of thickened liquids and avoiding mixed consistencies. Recommend initiating a diet of Dys 1 solids with nectar thick liquids. Meds should be given crushed in puree. Pt will benefit from continued SLP f/u both acutely and upon d/c with HH. Will continue to follow.    HPI HPI: Crystal Ashley is a 77 y.o. female who presented with altered mental status. CXR and Head CT 10/16 without acute findings. Per chart review pt is mostly bedbound, but was feeding herself and cutting up her own food pta. Pt with MBS in Oct of 2021 with concern for esophageal dysphagia and reflux to pharynx; recs for reg diet with f/u for esophageal issues as needed. 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      SLP Plan  Continue with current plan of care      Recommendations for follow up therapy are one component of a multi-disciplinary discharge planning process, led by the attending physician.  Recommendations may be updated based on patient status, additional functional criteria and insurance authorization.    Recommendations  Diet recommendations: Dysphagia 1 (puree);Nectar-thick liquid Liquids provided via: Cup;Straw Medication Administration: Crushed with puree Supervision: Staff  to assist with self feeding;Full supervision/cueing for compensatory strategies;Trained caregiver to feed patient Compensations: Minimize environmental distractions;Slow rate;Small sips/bites Postural Changes and/or Swallow Maneuvers: Seated upright 90 degrees;Upright 30-60 min after meal                  Oral care QID   Frequent or constant Supervision/Assistance Dysphagia, oropharyngeal phase (R13.12)     Continue with current plan of care     Gwynneth Aliment, M.A., CF-SLP Speech Language Pathology, Acute Rehabilitation Services  Secure Chat preferred [phone]   06/04/2023, 9:48 AM

## 2023-06-04 NOTE — Progress Notes (Signed)
Normal heart rate. .   MS:   All extremities are intact.   Neurologic:   Awake, alert       Data Reviewed: I have personally reviewed following labs and imaging studies  CBC: Recent Labs  Lab 06/02/23 1104 06/02/23 1111 06/03/23 0551 06/03/23 0653 06/04/23 0613  WBC 16.9*  --  29.9* 21.0* 12.7*  HGB 11.9* 12.9 5.0* 9.1* 8.7*  HCT 37.3 38.0 15.6* 28.0* 26.9*  MCV 94.0  --  99.4 94.0 94.1  PLT 393  --  355 267 252   Basic Metabolic Panel: Recent Labs  Lab 06/02/23 1104 06/02/23 1111  06/03/23 0551 06/04/23 0613  NA 132* 130* 135 135  K 5.3* 5.2* 3.8 3.8  CL 92* 93* 96* 98  CO2 25  --  23 25  GLUCOSE 375* 381* 158* 160*  BUN 26* 27* 22 20  CREATININE 1.36* 1.20* 1.27* 1.32*  CALCIUM 9.1  --  8.9 8.4*   GFR: Estimated Creatinine Clearance: 33.3 mL/min (A) (by C-G formula based on SCr of 1.32 mg/dL (H)). Liver Function Tests: Recent Labs  Lab 06/02/23 1104  AST 25  ALT 26  ALKPHOS 136*  BILITOT 0.9  PROT 7.2  ALBUMIN 3.0*   Recent Labs  Lab 06/02/23 1104  LIPASE 24   Recent Labs  Lab 06/02/23 1106  AMMONIA 27   Coagulation Profile: No results for input(s): "INR", "PROTIME" in the last 168 hours. Cardiac Enzymes: No results for input(s): "CKTOTAL", "CKMB", "CKMBINDEX", "TROPONINI" in the last 168 hours. BNP (last 3 results) No results for input(s): "PROBNP" in the last 8760 hours. HbA1C: No results for input(s): "HGBA1C" in the last 72 hours. CBG: Recent Labs  Lab 06/03/23 1422 06/03/23 2059 06/04/23 0806 06/04/23 1139 06/04/23 1233  GLUCAP 163* 160* 145* 241* 214*   Lipid Profile: No results for input(s): "CHOL", "HDL", "LDLCALC", "TRIG", "CHOLHDL", "LDLDIRECT" in the last 72 hours. Thyroid Function Tests: Recent Labs    06/02/23 1104 06/02/23 1113  TSH  --  2.900  FREET4 1.55*  --    Anemia Panel: No results for input(s): "VITAMINB12", "FOLATE", "FERRITIN", "TIBC", "IRON", "RETICCTPCT" in the last 72 hours. Sepsis Labs: Recent Labs  Lab 06/02/23 1112 06/02/23 1410 06/02/23 2227 06/03/23 0556  LATICACIDVEN 3.8* 3.1* 1.7 1.5    No results found for this or any previous visit (from the past 240 hour(s)).       Radiology Studies: DG CHEST PORT 1 VIEW  Result Date: 06/03/2023 CLINICAL DATA:  Altered mental status, aspiration into airway EXAM: PORTABLE CHEST 1 VIEW COMPARISON:  Radiographs 06/02/2023 and same day swallowing study FINDINGS: Stable cardiomediastinal contours. Aortic atherosclerotic calcification.  Hyperdense reticular opacities in the bilateral lower lungs is compatible with aspirated contrast. Contrast is present within the stomach. No focal consolidation, pleural effusion, or pneumothorax. Elevated right hemidiaphragm. Thoracic fusion hardware. IMPRESSION: Hyperdense reticular opacities in the bilateral lower lungs is compatible with aspirated contrast. Electronically Signed   By: Minerva Fester M.D.   On: 06/03/2023 15:28   DG Swallowing Func-Speech Pathology  Result Date: 06/03/2023 Table formatting from the original result was not included. Modified Barium Swallow Study Patient Details Name: Crystal Ashley MRN: [id number] Date of Birth: November 08, 1945 Today's Date: 06/03/2023 HPI/PMH: HPI: Crystal Ashley is a 77 y.o. female who presented with altered mental status. CXR and Head CT 10/16 without acute findings. Per chart review pt is mostly bedbound, but was feeding herself and cutting up her own food pta. Pt with MBS in  PROGRESS NOTE    Crystal Ashley  ZOX:[id number] DOB: Jun 26, 1946 DOA: 06/02/2023 PCP: Ardith Dark, MD    Brief Narrative:  Crystal Ashley is a 77 y.o. 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  Pt unable to provide history as she was non-verbal which is her baseline. Son provides hx over the phone. He is her caretaker and she also has day time aid. Pt is mostly bed-bound and has not ambulate in a year and a half. Today she was not helping with transfer. Son usually puts a belt around her and she helps push up on bed rails but today was just slumped over. She also was very diaphoretic. Did not cut up her breakfast as she usually does. Appeared weak. Only ate 2 bites and appear to want to vomit.    Assessment and Plan: Sepsis due to cellulitis vs aspiration PNA -most likely secondary to cellulitis sacral ulcer wounds as her CXR, CT A/P, UA, head CT was negative. No other obvious source except for wounds.  -administered IV vancomycin and cefepime in ED.-change to IV meds that cover aspiration -wound care consulted: Prevalon boots to bilateral heels Low air loss bed   AKI (acute kidney injury) (HCC) -creatinine elevated to 1.36 from baseline of 1 -trend  Chronic atrial fibrillation with RVR (HCC) -change to PO med -resume Xarelto   Dysphagia -son reports difficulty with swallowing pills at home for several months -while receiving Lokelma solution in ED, pt appears to have aspirated and had oxygen desaturation down to mid-80s. Placed on oxygen supplementation briefly with improvement -obtain speech eval- DYS 1 nectar thick -per son, would NOT want feeding tube   Acute metabolic encephalopathy -secondary to dehydration/aspiration? -appears to be back at baseline  Hyperkalemia -resolved  Hyponatremia -trend  Uncontrolled type 2  diabetes mellitus with hyperglycemia, without long-term current use of insulin (HCC) -recent A1C of 9.5 and was restarted on metformin recently -place on SSI  Hyperlipidemia associated with type 2 diabetes mellitus (HCC) -no longer on statin per cardiology as pt wanted to decrease medication burden  Hypothyroidism -Continue levothyroxine   Hypertension associated with diabetes (HCC) -hold diuretic due to AKI      DVT prophylaxis:  rivaroxaban (XARELTO) tablet 20 mg    Code Status: Limited: Do not attempt resuscitation (DNR) -DNR-LIMITED -Do Not Intubate/DNI  Family Communication: called son  Disposition Plan:  Level of care: Progressive Status is: Inpatient Remains inpatient appropriate     Consultants:  SLP   Subjective: Asking for water  Objective: Vitals:   06/04/23 0811 06/04/23 0909 06/04/23 1142 06/04/23 1207  BP: 123/63 130/69 137/71   Pulse:  68    Resp: (!) 24 (!) 22 (!) 22 (!) 21  Temp: 97.8 F (36.6 C) 97.9 F (36.6 C)    TempSrc: Oral Oral    SpO2:  95% 100%   Weight:      Height:        Intake/Output Summary (Last 24 hours) at 06/04/2023 1257 Last data filed at 06/04/2023 0400 Gross per 24 hour  Intake 265.6 ml  Output --  Net 265.6 ml   Filed Weights   06/02/23 1200 06/03/23 1406  Weight: 82.6 kg 79.5 kg    Examination:    General: Appearance:    Obese female in no acute distress     Lungs:     respirations unlabored  Heart:  Normal heart rate. .   MS:   All extremities are intact.   Neurologic:   Awake, alert       Data Reviewed: I have personally reviewed following labs and imaging studies  CBC: Recent Labs  Lab 06/02/23 1104 06/02/23 1111 06/03/23 0551 06/03/23 0653 06/04/23 0613  WBC 16.9*  --  29.9* 21.0* 12.7*  HGB 11.9* 12.9 5.0* 9.1* 8.7*  HCT 37.3 38.0 15.6* 28.0* 26.9*  MCV 94.0  --  99.4 94.0 94.1  PLT 393  --  355 267 252   Basic Metabolic Panel: Recent Labs  Lab 06/02/23 1104 06/02/23 1111  06/03/23 0551 06/04/23 0613  NA 132* 130* 135 135  K 5.3* 5.2* 3.8 3.8  CL 92* 93* 96* 98  CO2 25  --  23 25  GLUCOSE 375* 381* 158* 160*  BUN 26* 27* 22 20  CREATININE 1.36* 1.20* 1.27* 1.32*  CALCIUM 9.1  --  8.9 8.4*   GFR: Estimated Creatinine Clearance: 33.3 mL/min (A) (by C-G formula based on SCr of 1.32 mg/dL (H)). Liver Function Tests: Recent Labs  Lab 06/02/23 1104  AST 25  ALT 26  ALKPHOS 136*  BILITOT 0.9  PROT 7.2  ALBUMIN 3.0*   Recent Labs  Lab 06/02/23 1104  LIPASE 24   Recent Labs  Lab 06/02/23 1106  AMMONIA 27   Coagulation Profile: No results for input(s): "INR", "PROTIME" in the last 168 hours. Cardiac Enzymes: No results for input(s): "CKTOTAL", "CKMB", "CKMBINDEX", "TROPONINI" in the last 168 hours. BNP (last 3 results) No results for input(s): "PROBNP" in the last 8760 hours. HbA1C: No results for input(s): "HGBA1C" in the last 72 hours. CBG: Recent Labs  Lab 06/03/23 1422 06/03/23 2059 06/04/23 0806 06/04/23 1139 06/04/23 1233  GLUCAP 163* 160* 145* 241* 214*   Lipid Profile: No results for input(s): "CHOL", "HDL", "LDLCALC", "TRIG", "CHOLHDL", "LDLDIRECT" in the last 72 hours. Thyroid Function Tests: Recent Labs    06/02/23 1104 06/02/23 1113  TSH  --  2.900  FREET4 1.55*  --    Anemia Panel: No results for input(s): "VITAMINB12", "FOLATE", "FERRITIN", "TIBC", "IRON", "RETICCTPCT" in the last 72 hours. Sepsis Labs: Recent Labs  Lab 06/02/23 1112 06/02/23 1410 06/02/23 2227 06/03/23 0556  LATICACIDVEN 3.8* 3.1* 1.7 1.5    No results found for this or any previous visit (from the past 240 hour(s)).       Radiology Studies: DG CHEST PORT 1 VIEW  Result Date: 06/03/2023 CLINICAL DATA:  Altered mental status, aspiration into airway EXAM: PORTABLE CHEST 1 VIEW COMPARISON:  Radiographs 06/02/2023 and same day swallowing study FINDINGS: Stable cardiomediastinal contours. Aortic atherosclerotic calcification.  Hyperdense reticular opacities in the bilateral lower lungs is compatible with aspirated contrast. Contrast is present within the stomach. No focal consolidation, pleural effusion, or pneumothorax. Elevated right hemidiaphragm. Thoracic fusion hardware. IMPRESSION: Hyperdense reticular opacities in the bilateral lower lungs is compatible with aspirated contrast. Electronically Signed   By: Minerva Fester M.D.   On: 06/03/2023 15:28   DG Swallowing Func-Speech Pathology  Result Date: 06/03/2023 Table formatting from the original result was not included. Modified Barium Swallow Study Patient Details Name: Crystal Ashley MRN: [id number] Date of Birth: November 08, 1945 Today's Date: 06/03/2023 HPI/PMH: HPI: Crystal Ashley is a 77 y.o. female who presented with altered mental status. CXR and Head CT 10/16 without acute findings. Per chart review pt is mostly bedbound, but was feeding herself and cutting up her own food pta. Pt with MBS in  Normal heart rate. .   MS:   All extremities are intact.   Neurologic:   Awake, alert       Data Reviewed: I have personally reviewed following labs and imaging studies  CBC: Recent Labs  Lab 06/02/23 1104 06/02/23 1111 06/03/23 0551 06/03/23 0653 06/04/23 0613  WBC 16.9*  --  29.9* 21.0* 12.7*  HGB 11.9* 12.9 5.0* 9.1* 8.7*  HCT 37.3 38.0 15.6* 28.0* 26.9*  MCV 94.0  --  99.4 94.0 94.1  PLT 393  --  355 267 252   Basic Metabolic Panel: Recent Labs  Lab 06/02/23 1104 06/02/23 1111  06/03/23 0551 06/04/23 0613  NA 132* 130* 135 135  K 5.3* 5.2* 3.8 3.8  CL 92* 93* 96* 98  CO2 25  --  23 25  GLUCOSE 375* 381* 158* 160*  BUN 26* 27* 22 20  CREATININE 1.36* 1.20* 1.27* 1.32*  CALCIUM 9.1  --  8.9 8.4*   GFR: Estimated Creatinine Clearance: 33.3 mL/min (A) (by C-G formula based on SCr of 1.32 mg/dL (H)). Liver Function Tests: Recent Labs  Lab 06/02/23 1104  AST 25  ALT 26  ALKPHOS 136*  BILITOT 0.9  PROT 7.2  ALBUMIN 3.0*   Recent Labs  Lab 06/02/23 1104  LIPASE 24   Recent Labs  Lab 06/02/23 1106  AMMONIA 27   Coagulation Profile: No results for input(s): "INR", "PROTIME" in the last 168 hours. Cardiac Enzymes: No results for input(s): "CKTOTAL", "CKMB", "CKMBINDEX", "TROPONINI" in the last 168 hours. BNP (last 3 results) No results for input(s): "PROBNP" in the last 8760 hours. HbA1C: No results for input(s): "HGBA1C" in the last 72 hours. CBG: Recent Labs  Lab 06/03/23 1422 06/03/23 2059 06/04/23 0806 06/04/23 1139 06/04/23 1233  GLUCAP 163* 160* 145* 241* 214*   Lipid Profile: No results for input(s): "CHOL", "HDL", "LDLCALC", "TRIG", "CHOLHDL", "LDLDIRECT" in the last 72 hours. Thyroid Function Tests: Recent Labs    06/02/23 1104 06/02/23 1113  TSH  --  2.900  FREET4 1.55*  --    Anemia Panel: No results for input(s): "VITAMINB12", "FOLATE", "FERRITIN", "TIBC", "IRON", "RETICCTPCT" in the last 72 hours. Sepsis Labs: Recent Labs  Lab 06/02/23 1112 06/02/23 1410 06/02/23 2227 06/03/23 0556  LATICACIDVEN 3.8* 3.1* 1.7 1.5    No results found for this or any previous visit (from the past 240 hour(s)).       Radiology Studies: DG CHEST PORT 1 VIEW  Result Date: 06/03/2023 CLINICAL DATA:  Altered mental status, aspiration into airway EXAM: PORTABLE CHEST 1 VIEW COMPARISON:  Radiographs 06/02/2023 and same day swallowing study FINDINGS: Stable cardiomediastinal contours. Aortic atherosclerotic calcification.  Hyperdense reticular opacities in the bilateral lower lungs is compatible with aspirated contrast. Contrast is present within the stomach. No focal consolidation, pleural effusion, or pneumothorax. Elevated right hemidiaphragm. Thoracic fusion hardware. IMPRESSION: Hyperdense reticular opacities in the bilateral lower lungs is compatible with aspirated contrast. Electronically Signed   By: Minerva Fester M.D.   On: 06/03/2023 15:28   DG Swallowing Func-Speech Pathology  Result Date: 06/03/2023 Table formatting from the original result was not included. Modified Barium Swallow Study Patient Details Name: Crystal Ashley MRN: [id number] Date of Birth: November 08, 1945 Today's Date: 06/03/2023 HPI/PMH: HPI: Crystal Ashley is a 77 y.o. female who presented with altered mental status. CXR and Head CT 10/16 without acute findings. Per chart review pt is mostly bedbound, but was feeding herself and cutting up her own food pta. Pt with MBS in  PROGRESS NOTE    Crystal Ashley  ZOX:[id number] DOB: Jun 26, 1946 DOA: 06/02/2023 PCP: Ardith Dark, MD    Brief Narrative:  Crystal Ashley is a 77 y.o. 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  Pt unable to provide history as she was non-verbal which is her baseline. Son provides hx over the phone. He is her caretaker and she also has day time aid. Pt is mostly bed-bound and has not ambulate in a year and a half. Today she was not helping with transfer. Son usually puts a belt around her and she helps push up on bed rails but today was just slumped over. She also was very diaphoretic. Did not cut up her breakfast as she usually does. Appeared weak. Only ate 2 bites and appear to want to vomit.    Assessment and Plan: Sepsis due to cellulitis vs aspiration PNA -most likely secondary to cellulitis sacral ulcer wounds as her CXR, CT A/P, UA, head CT was negative. No other obvious source except for wounds.  -administered IV vancomycin and cefepime in ED.-change to IV meds that cover aspiration -wound care consulted: Prevalon boots to bilateral heels Low air loss bed   AKI (acute kidney injury) (HCC) -creatinine elevated to 1.36 from baseline of 1 -trend  Chronic atrial fibrillation with RVR (HCC) -change to PO med -resume Xarelto   Dysphagia -son reports difficulty with swallowing pills at home for several months -while receiving Lokelma solution in ED, pt appears to have aspirated and had oxygen desaturation down to mid-80s. Placed on oxygen supplementation briefly with improvement -obtain speech eval- DYS 1 nectar thick -per son, would NOT want feeding tube   Acute metabolic encephalopathy -secondary to dehydration/aspiration? -appears to be back at baseline  Hyperkalemia -resolved  Hyponatremia -trend  Uncontrolled type 2  diabetes mellitus with hyperglycemia, without long-term current use of insulin (HCC) -recent A1C of 9.5 and was restarted on metformin recently -place on SSI  Hyperlipidemia associated with type 2 diabetes mellitus (HCC) -no longer on statin per cardiology as pt wanted to decrease medication burden  Hypothyroidism -Continue levothyroxine   Hypertension associated with diabetes (HCC) -hold diuretic due to AKI      DVT prophylaxis:  rivaroxaban (XARELTO) tablet 20 mg    Code Status: Limited: Do not attempt resuscitation (DNR) -DNR-LIMITED -Do Not Intubate/DNI  Family Communication: called son  Disposition Plan:  Level of care: Progressive Status is: Inpatient Remains inpatient appropriate     Consultants:  SLP   Subjective: Asking for water  Objective: Vitals:   06/04/23 0811 06/04/23 0909 06/04/23 1142 06/04/23 1207  BP: 123/63 130/69 137/71   Pulse:  68    Resp: (!) 24 (!) 22 (!) 22 (!) 21  Temp: 97.8 F (36.6 C) 97.9 F (36.6 C)    TempSrc: Oral Oral    SpO2:  95% 100%   Weight:      Height:        Intake/Output Summary (Last 24 hours) at 06/04/2023 1257 Last data filed at 06/04/2023 0400 Gross per 24 hour  Intake 265.6 ml  Output --  Net 265.6 ml   Filed Weights   06/02/23 1200 06/03/23 1406  Weight: 82.6 kg 79.5 kg    Examination:    General: Appearance:    Obese female in no acute distress     Lungs:     respirations unlabored  Heart:

## 2023-06-04 NOTE — Progress Notes (Signed)
The patient . is on Cardizem drip. The BP is soft  but it's still in the parameter to continue to infuse. Patient has  Metoprolol schedule at midnight. Notified Dr. Antionette Char to hold it and he agreed. Will continue to monitor.

## 2023-06-04 NOTE — Care Management Important Message (Signed)
Important Message  Patient Details  Name: Crystal Ashley MRN: [id number] Date of Birth: Dec 04, 1945   Important Message Given:  Yes - Medicare IM     Sherilyn Banker 06/04/2023, 2:05 PM

## 2023-06-04 NOTE — Plan of Care (Signed)
[facility] Tonsillectomy/Adenoidectomy, Postoperative PEDS care plan entered in error.

## 2023-06-05 DIAGNOSIS — L0390 Cellulitis, unspecified: Secondary | ICD-10-CM | POA: Diagnosis not present

## 2023-06-05 DIAGNOSIS — A419 Sepsis, unspecified organism: Secondary | ICD-10-CM | POA: Diagnosis not present

## 2023-06-05 LAB — CBC
HCT: 28.6 % — ABNORMAL LOW (ref 36.0–46.0)
Hemoglobin: 9.1 g/dL — ABNORMAL LOW (ref 12.0–15.0)
MCH: 30.4 pg (ref 26.0–34.0)
MCHC: 31.8 g/dL (ref 30.0–36.0)
MCV: 95.7 fL (ref 80.0–100.0)
Platelets: 268 10*3/uL (ref 150–400)
RBC: 2.99 MIL/uL — ABNORMAL LOW (ref 3.87–5.11)
RDW: 17 % — ABNORMAL HIGH (ref 11.5–15.5)
WBC: 13.8 10*3/uL — ABNORMAL HIGH (ref 4.0–10.5)
nRBC: 0.1 % (ref 0.0–0.2)

## 2023-06-05 LAB — BASIC METABOLIC PANEL
Anion gap: 17 — ABNORMAL HIGH (ref 5–15)
BUN: 17 mg/dL (ref 8–23)
CO2: 25 mmol/L (ref 22–32)
Calcium: 9.5 mg/dL (ref 8.9–10.3)
Chloride: 97 mmol/L — ABNORMAL LOW (ref 98–111)
Creatinine, Ser: 0.99 mg/dL (ref 0.44–1.00)
GFR, Estimated: 59 mL/min — ABNORMAL LOW (ref >60)
Glucose, Bld: 114 mg/dL — ABNORMAL HIGH (ref 70–99)
Potassium: 3.5 mmol/L (ref 3.5–5.1)
Sodium: 139 mmol/L (ref 135–145)

## 2023-06-05 LAB — GLUCOSE, CAPILLARY
Glucose-Capillary: 196 mg/dL — ABNORMAL HIGH (ref 70–99)
Glucose-Capillary: 159 mg/dL — ABNORMAL HIGH (ref 70–99)
Glucose-Capillary: 243 mg/dL — ABNORMAL HIGH (ref 70–99)
Glucose-Capillary: 274 mg/dL — ABNORMAL HIGH (ref 70–99)
Glucose-Capillary: 209 mg/dL — ABNORMAL HIGH (ref 70–99)

## 2023-06-05 MED ORDER — METOPROLOL TARTRATE 25 MG PO TABS
37.5000 mg | ORAL_TABLET | Freq: Two times a day (BID) | ORAL | Status: DC
  Administered 2023-06-05 – 2023-06-07 (×4): 37.5 mg via ORAL
  Filled 2023-06-05 (×4): qty 1

## 2023-06-05 MED ORDER — METOPROLOL TARTRATE 25 MG PO TABS
25.0000 mg | ORAL_TABLET | Freq: Two times a day (BID) | ORAL | Status: DC
  Administered 2023-06-05: 25 mg via ORAL
  Filled 2023-06-05: qty 1

## 2023-06-05 MED ORDER — RIVAROXABAN 20 MG PO TABS
20.0000 mg | ORAL_TABLET | Freq: Every day | ORAL | Status: DC
  Administered 2023-06-05 – 2023-06-07 (×3): 20 mg via ORAL
  Filled 2023-06-05 (×4): qty 1

## 2023-06-05 MED ORDER — DILTIAZEM LOAD VIA INFUSION
10.0000 mg | Freq: Once | INTRAVENOUS | Status: DC
  Filled 2023-06-05: qty 10

## 2023-06-05 MED ORDER — METFORMIN HCL 500 MG PO TABS
500.0000 mg | ORAL_TABLET | Freq: Every day | ORAL | Status: DC
  Administered 2023-06-05 – 2023-06-07 (×3): 500 mg via ORAL
  Filled 2023-06-05 (×3): qty 1

## 2023-06-05 MED ORDER — PNEUMOCOCCAL 20-VAL CONJ VACC 0.5 ML IM SUSY
0.5000 mL | PREFILLED_SYRINGE | INTRAMUSCULAR | Status: AC
  Administered 2023-06-06: 0.5 mL via INTRAMUSCULAR
  Filled 2023-06-05: qty 0.5

## 2023-06-05 NOTE — Progress Notes (Signed)
Tachycardic. .   MS:   All extremities are intact.   Neurologic:   Awake, alert       Data Reviewed: I have personally reviewed following labs and imaging studies  CBC: Recent Labs  Lab 06/02/23 1104 06/02/23 1111 06/03/23 0551 06/03/23 0653 06/04/23 0613 06/05/23 0523  WBC 16.9*  --  29.9* 21.0* 12.7* 13.8*  HGB 11.9* 12.9 5.0* 9.1* 8.7* 9.1*  HCT 37.3 38.0 15.6* 28.0* 26.9* 28.6*  MCV 94.0  --  99.4 94.0 94.1 95.7  PLT 393  --  355 267 252 268   Basic Metabolic Panel: Recent Labs   Lab 06/02/23 1104 06/02/23 1111 06/03/23 0551 06/04/23 0613 06/05/23 0523  NA 132* 130* 135 135 139  K 5.3* 5.2* 3.8 3.8 3.5  CL 92* 93* 96* 98 97*  CO2 25  --  23 25 25   GLUCOSE 375* 381* 158* 160* 114*  BUN 26* 27* 22 20 17   CREATININE 1.36* 1.20* 1.27* 1.32* 0.99  CALCIUM 9.1  --  8.9 8.4* 9.5   GFR: Estimated Creatinine Clearance: 44.4 mL/min (by C-G formula based on SCr of 0.99 mg/dL). Liver Function Tests: Recent Labs  Lab 06/02/23 1104  AST 25  ALT 26  ALKPHOS 136*  BILITOT 0.9  PROT 7.2  ALBUMIN 3.0*   Recent Labs  Lab 06/02/23 1104  LIPASE 24   Recent Labs  Lab 06/02/23 1106  AMMONIA 27   Coagulation Profile: No results for input(s): "INR", "PROTIME" in the last 168 hours. Cardiac Enzymes: No results for input(s): "CKTOTAL", "CKMB", "CKMBINDEX", "TROPONINI" in the last 168 hours. BNP (last 3 results) No results for input(s): "PROBNP" in the last 8760 hours. HbA1C: No results for input(s): "HGBA1C" in the last 72 hours. CBG: Recent Labs  Lab 06/04/23 2204 06/04/23 2315 06/05/23 0217 06/05/23 0748 06/05/23 1131  GLUCAP 380* 317* 196* 159* 243*   Lipid Profile: No results for input(s): "CHOL", "HDL", "LDLCALC", "TRIG", "CHOLHDL", "LDLDIRECT" in the last 72 hours. Thyroid Function Tests: No results for input(s): "TSH", "T4TOTAL", "FREET4", "T3FREE", "THYROIDAB" in the last 72 hours.  Anemia Panel: No results for input(s): "VITAMINB12", "FOLATE", "FERRITIN", "TIBC", "IRON", "RETICCTPCT" in the last 72 hours. Sepsis Labs: Recent Labs  Lab 06/02/23 1112 06/02/23 1410 06/02/23 2227 06/03/23 0556  LATICACIDVEN 3.8* 3.1* 1.7 1.5    No results found for this or any previous visit (from the past 240 hour(s)).       Radiology Studies: DG CHEST PORT 1 VIEW  Result Date: 06/03/2023 CLINICAL DATA:  Altered mental status, aspiration into airway EXAM: PORTABLE CHEST 1 VIEW COMPARISON:  Radiographs 06/02/2023 and same day swallowing study  FINDINGS: Stable cardiomediastinal contours. Aortic atherosclerotic calcification. Hyperdense reticular opacities in the bilateral lower lungs is compatible with aspirated contrast. Contrast is present within the stomach. No focal consolidation, pleural effusion, or pneumothorax. Elevated right hemidiaphragm. Thoracic fusion hardware. IMPRESSION: Hyperdense reticular opacities in the bilateral lower lungs is compatible with aspirated contrast. Electronically Signed   By: Minerva Fester M.D.   On: 06/03/2023 15:28   DG Swallowing Func-Speech Pathology  Result Date: 06/03/2023 Table formatting from the original result was not included. Modified Barium Swallow Study Patient Details Name: Crystal Ashley MRN: [id number] Date of Birth: August 08, 1946 Today's Date: 06/03/2023 HPI/PMH: HPI: Crystal Ashley is a 77 y.o. female who presented with altered mental status. CXR and Head CT 10/16 without acute findings. Per chart review pt is mostly bedbound, but was feeding herself and cutting up her own food  Tachycardic. .   MS:   All extremities are intact.   Neurologic:   Awake, alert       Data Reviewed: I have personally reviewed following labs and imaging studies  CBC: Recent Labs  Lab 06/02/23 1104 06/02/23 1111 06/03/23 0551 06/03/23 0653 06/04/23 0613 06/05/23 0523  WBC 16.9*  --  29.9* 21.0* 12.7* 13.8*  HGB 11.9* 12.9 5.0* 9.1* 8.7* 9.1*  HCT 37.3 38.0 15.6* 28.0* 26.9* 28.6*  MCV 94.0  --  99.4 94.0 94.1 95.7  PLT 393  --  355 267 252 268   Basic Metabolic Panel: Recent Labs   Lab 06/02/23 1104 06/02/23 1111 06/03/23 0551 06/04/23 0613 06/05/23 0523  NA 132* 130* 135 135 139  K 5.3* 5.2* 3.8 3.8 3.5  CL 92* 93* 96* 98 97*  CO2 25  --  23 25 25   GLUCOSE 375* 381* 158* 160* 114*  BUN 26* 27* 22 20 17   CREATININE 1.36* 1.20* 1.27* 1.32* 0.99  CALCIUM 9.1  --  8.9 8.4* 9.5   GFR: Estimated Creatinine Clearance: 44.4 mL/min (by C-G formula based on SCr of 0.99 mg/dL). Liver Function Tests: Recent Labs  Lab 06/02/23 1104  AST 25  ALT 26  ALKPHOS 136*  BILITOT 0.9  PROT 7.2  ALBUMIN 3.0*   Recent Labs  Lab 06/02/23 1104  LIPASE 24   Recent Labs  Lab 06/02/23 1106  AMMONIA 27   Coagulation Profile: No results for input(s): "INR", "PROTIME" in the last 168 hours. Cardiac Enzymes: No results for input(s): "CKTOTAL", "CKMB", "CKMBINDEX", "TROPONINI" in the last 168 hours. BNP (last 3 results) No results for input(s): "PROBNP" in the last 8760 hours. HbA1C: No results for input(s): "HGBA1C" in the last 72 hours. CBG: Recent Labs  Lab 06/04/23 2204 06/04/23 2315 06/05/23 0217 06/05/23 0748 06/05/23 1131  GLUCAP 380* 317* 196* 159* 243*   Lipid Profile: No results for input(s): "CHOL", "HDL", "LDLCALC", "TRIG", "CHOLHDL", "LDLDIRECT" in the last 72 hours. Thyroid Function Tests: No results for input(s): "TSH", "T4TOTAL", "FREET4", "T3FREE", "THYROIDAB" in the last 72 hours.  Anemia Panel: No results for input(s): "VITAMINB12", "FOLATE", "FERRITIN", "TIBC", "IRON", "RETICCTPCT" in the last 72 hours. Sepsis Labs: Recent Labs  Lab 06/02/23 1112 06/02/23 1410 06/02/23 2227 06/03/23 0556  LATICACIDVEN 3.8* 3.1* 1.7 1.5    No results found for this or any previous visit (from the past 240 hour(s)).       Radiology Studies: DG CHEST PORT 1 VIEW  Result Date: 06/03/2023 CLINICAL DATA:  Altered mental status, aspiration into airway EXAM: PORTABLE CHEST 1 VIEW COMPARISON:  Radiographs 06/02/2023 and same day swallowing study  FINDINGS: Stable cardiomediastinal contours. Aortic atherosclerotic calcification. Hyperdense reticular opacities in the bilateral lower lungs is compatible with aspirated contrast. Contrast is present within the stomach. No focal consolidation, pleural effusion, or pneumothorax. Elevated right hemidiaphragm. Thoracic fusion hardware. IMPRESSION: Hyperdense reticular opacities in the bilateral lower lungs is compatible with aspirated contrast. Electronically Signed   By: Minerva Fester M.D.   On: 06/03/2023 15:28   DG Swallowing Func-Speech Pathology  Result Date: 06/03/2023 Table formatting from the original result was not included. Modified Barium Swallow Study Patient Details Name: Crystal Ashley MRN: [id number] Date of Birth: August 08, 1946 Today's Date: 06/03/2023 HPI/PMH: HPI: Crystal Ashley is a 77 y.o. female who presented with altered mental status. CXR and Head CT 10/16 without acute findings. Per chart review pt is mostly bedbound, but was feeding herself and cutting up her own food  Tachycardic. .   MS:   All extremities are intact.   Neurologic:   Awake, alert       Data Reviewed: I have personally reviewed following labs and imaging studies  CBC: Recent Labs  Lab 06/02/23 1104 06/02/23 1111 06/03/23 0551 06/03/23 0653 06/04/23 0613 06/05/23 0523  WBC 16.9*  --  29.9* 21.0* 12.7* 13.8*  HGB 11.9* 12.9 5.0* 9.1* 8.7* 9.1*  HCT 37.3 38.0 15.6* 28.0* 26.9* 28.6*  MCV 94.0  --  99.4 94.0 94.1 95.7  PLT 393  --  355 267 252 268   Basic Metabolic Panel: Recent Labs   Lab 06/02/23 1104 06/02/23 1111 06/03/23 0551 06/04/23 0613 06/05/23 0523  NA 132* 130* 135 135 139  K 5.3* 5.2* 3.8 3.8 3.5  CL 92* 93* 96* 98 97*  CO2 25  --  23 25 25   GLUCOSE 375* 381* 158* 160* 114*  BUN 26* 27* 22 20 17   CREATININE 1.36* 1.20* 1.27* 1.32* 0.99  CALCIUM 9.1  --  8.9 8.4* 9.5   GFR: Estimated Creatinine Clearance: 44.4 mL/min (by C-G formula based on SCr of 0.99 mg/dL). Liver Function Tests: Recent Labs  Lab 06/02/23 1104  AST 25  ALT 26  ALKPHOS 136*  BILITOT 0.9  PROT 7.2  ALBUMIN 3.0*   Recent Labs  Lab 06/02/23 1104  LIPASE 24   Recent Labs  Lab 06/02/23 1106  AMMONIA 27   Coagulation Profile: No results for input(s): "INR", "PROTIME" in the last 168 hours. Cardiac Enzymes: No results for input(s): "CKTOTAL", "CKMB", "CKMBINDEX", "TROPONINI" in the last 168 hours. BNP (last 3 results) No results for input(s): "PROBNP" in the last 8760 hours. HbA1C: No results for input(s): "HGBA1C" in the last 72 hours. CBG: Recent Labs  Lab 06/04/23 2204 06/04/23 2315 06/05/23 0217 06/05/23 0748 06/05/23 1131  GLUCAP 380* 317* 196* 159* 243*   Lipid Profile: No results for input(s): "CHOL", "HDL", "LDLCALC", "TRIG", "CHOLHDL", "LDLDIRECT" in the last 72 hours. Thyroid Function Tests: No results for input(s): "TSH", "T4TOTAL", "FREET4", "T3FREE", "THYROIDAB" in the last 72 hours.  Anemia Panel: No results for input(s): "VITAMINB12", "FOLATE", "FERRITIN", "TIBC", "IRON", "RETICCTPCT" in the last 72 hours. Sepsis Labs: Recent Labs  Lab 06/02/23 1112 06/02/23 1410 06/02/23 2227 06/03/23 0556  LATICACIDVEN 3.8* 3.1* 1.7 1.5    No results found for this or any previous visit (from the past 240 hour(s)).       Radiology Studies: DG CHEST PORT 1 VIEW  Result Date: 06/03/2023 CLINICAL DATA:  Altered mental status, aspiration into airway EXAM: PORTABLE CHEST 1 VIEW COMPARISON:  Radiographs 06/02/2023 and same day swallowing study  FINDINGS: Stable cardiomediastinal contours. Aortic atherosclerotic calcification. Hyperdense reticular opacities in the bilateral lower lungs is compatible with aspirated contrast. Contrast is present within the stomach. No focal consolidation, pleural effusion, or pneumothorax. Elevated right hemidiaphragm. Thoracic fusion hardware. IMPRESSION: Hyperdense reticular opacities in the bilateral lower lungs is compatible with aspirated contrast. Electronically Signed   By: Minerva Fester M.D.   On: 06/03/2023 15:28   DG Swallowing Func-Speech Pathology  Result Date: 06/03/2023 Table formatting from the original result was not included. Modified Barium Swallow Study Patient Details Name: Crystal Ashley MRN: [id number] Date of Birth: August 08, 1946 Today's Date: 06/03/2023 HPI/PMH: HPI: Crystal Ashley is a 77 y.o. female who presented with altered mental status. CXR and Head CT 10/16 without acute findings. Per chart review pt is mostly bedbound, but was feeding herself and cutting up her own food  Tachycardic. .   MS:   All extremities are intact.   Neurologic:   Awake, alert       Data Reviewed: I have personally reviewed following labs and imaging studies  CBC: Recent Labs  Lab 06/02/23 1104 06/02/23 1111 06/03/23 0551 06/03/23 0653 06/04/23 0613 06/05/23 0523  WBC 16.9*  --  29.9* 21.0* 12.7* 13.8*  HGB 11.9* 12.9 5.0* 9.1* 8.7* 9.1*  HCT 37.3 38.0 15.6* 28.0* 26.9* 28.6*  MCV 94.0  --  99.4 94.0 94.1 95.7  PLT 393  --  355 267 252 268   Basic Metabolic Panel: Recent Labs   Lab 06/02/23 1104 06/02/23 1111 06/03/23 0551 06/04/23 0613 06/05/23 0523  NA 132* 130* 135 135 139  K 5.3* 5.2* 3.8 3.8 3.5  CL 92* 93* 96* 98 97*  CO2 25  --  23 25 25   GLUCOSE 375* 381* 158* 160* 114*  BUN 26* 27* 22 20 17   CREATININE 1.36* 1.20* 1.27* 1.32* 0.99  CALCIUM 9.1  --  8.9 8.4* 9.5   GFR: Estimated Creatinine Clearance: 44.4 mL/min (by C-G formula based on SCr of 0.99 mg/dL). Liver Function Tests: Recent Labs  Lab 06/02/23 1104  AST 25  ALT 26  ALKPHOS 136*  BILITOT 0.9  PROT 7.2  ALBUMIN 3.0*   Recent Labs  Lab 06/02/23 1104  LIPASE 24   Recent Labs  Lab 06/02/23 1106  AMMONIA 27   Coagulation Profile: No results for input(s): "INR", "PROTIME" in the last 168 hours. Cardiac Enzymes: No results for input(s): "CKTOTAL", "CKMB", "CKMBINDEX", "TROPONINI" in the last 168 hours. BNP (last 3 results) No results for input(s): "PROBNP" in the last 8760 hours. HbA1C: No results for input(s): "HGBA1C" in the last 72 hours. CBG: Recent Labs  Lab 06/04/23 2204 06/04/23 2315 06/05/23 0217 06/05/23 0748 06/05/23 1131  GLUCAP 380* 317* 196* 159* 243*   Lipid Profile: No results for input(s): "CHOL", "HDL", "LDLCALC", "TRIG", "CHOLHDL", "LDLDIRECT" in the last 72 hours. Thyroid Function Tests: No results for input(s): "TSH", "T4TOTAL", "FREET4", "T3FREE", "THYROIDAB" in the last 72 hours.  Anemia Panel: No results for input(s): "VITAMINB12", "FOLATE", "FERRITIN", "TIBC", "IRON", "RETICCTPCT" in the last 72 hours. Sepsis Labs: Recent Labs  Lab 06/02/23 1112 06/02/23 1410 06/02/23 2227 06/03/23 0556  LATICACIDVEN 3.8* 3.1* 1.7 1.5    No results found for this or any previous visit (from the past 240 hour(s)).       Radiology Studies: DG CHEST PORT 1 VIEW  Result Date: 06/03/2023 CLINICAL DATA:  Altered mental status, aspiration into airway EXAM: PORTABLE CHEST 1 VIEW COMPARISON:  Radiographs 06/02/2023 and same day swallowing study  FINDINGS: Stable cardiomediastinal contours. Aortic atherosclerotic calcification. Hyperdense reticular opacities in the bilateral lower lungs is compatible with aspirated contrast. Contrast is present within the stomach. No focal consolidation, pleural effusion, or pneumothorax. Elevated right hemidiaphragm. Thoracic fusion hardware. IMPRESSION: Hyperdense reticular opacities in the bilateral lower lungs is compatible with aspirated contrast. Electronically Signed   By: Minerva Fester M.D.   On: 06/03/2023 15:28   DG Swallowing Func-Speech Pathology  Result Date: 06/03/2023 Table formatting from the original result was not included. Modified Barium Swallow Study Patient Details Name: Crystal Ashley MRN: [id number] Date of Birth: August 08, 1946 Today's Date: 06/03/2023 HPI/PMH: HPI: Crystal Ashley is a 77 y.o. female who presented with altered mental status. CXR and Head CT 10/16 without acute findings. Per chart review pt is mostly bedbound, but was feeding herself and cutting up her own food  PROGRESS NOTE    Crystal Ashley  WUJ:[id number] DOB: [birth year] DOA: 06/02/2023 PCP: Ardith Dark, MD    Brief Narrative:  Crystal Ashley is a 77 y.o. 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  Pt unable to provide history as she was non-verbal which is her baseline. Son provides hx over the phone. He is her caretaker and she also has day time aid. Pt is mostly bed-bound and has not ambulate in a year and a half. Today she was not helping with transfer. Son usually puts a belt around her and she helps push up on bed rails but today was just slumped over. She also was very diaphoretic. Did not cut up her breakfast as she usually does. Appeared weak. Only ate 2 bites and appear to want to vomit.    Assessment and Plan: Sepsis due to cellulitis vs aspiration PNA -most likely secondary to cellulitis sacral ulcer wounds as her CXR, CT A/P, UA, head CT was negative. No other obvious source except for wounds.  -administered IV vancomycin and cefepime in ED.- IV meds that cover aspiration -wound care consulted: Prevalon boots to bilateral heels Low air loss bed   AKI (acute kidney injury) (HCC) -resolved  Chronic atrial fibrillation with RVR (HCC) -change to PO med-- one that can be crushed -resume Xarelto   Dysphagia -son reports difficulty with swallowing pills at home for several months -while receiving Lokelma solution in ED, pt appears to have aspirated and had oxygen desaturation down to mid-80s. Placed on oxygen supplementation briefly with improvement -obtain speech eval- DYS 1 nectar thick -per son, would NOT want feeding tube   Acute metabolic encephalopathy -secondary to dehydration/aspiration? -appears to be back at baseline  Hyperkalemia -resolved  Hyponatremia -trend  Uncontrolled type 2 diabetes mellitus with  hyperglycemia, without long-term current use of insulin (HCC) -recent A1C of 9.5 and was restarted on metformin recently -place on SSI and asjust meds  Hyperlipidemia associated with type 2 diabetes mellitus (HCC) -no longer on statin per cardiology as pt wanted to decrease medication burden  Hypothyroidism -Continue levothyroxine   Hypertension associated with diabetes (HCC) -hold diuretic due to AKI      DVT prophylaxis:  Rivaroxaban (XARELTO) tablet 15 mg    Code Status: Limited: Do not attempt resuscitation (DNR) -DNR-LIMITED -Do Not Intubate/DNI  Family Communication: called son  Disposition Plan:  Level of care: Telemetry Cardiac Status is: Inpatient Remains inpatient appropriate     Consultants:  SLP   Subjective: Sugar elevated overnight  Objective: Vitals:   06/05/23 0436 06/05/23 0536 06/05/23 0736 06/05/23 0816  BP: 131/73 (!) 147/85 127/72   Pulse: 91 (!) 105 91 (!) 115  Resp: 18 20 20    Temp:      TempSrc: Oral Oral Oral   SpO2: 100% 100% 99%   Weight:      Height:        Intake/Output Summary (Last 24 hours) at 06/05/2023 1246 Last data filed at 06/04/2023 1300 Gross per 24 hour  Intake 485 ml  Output --  Net 485 ml   Filed Weights   06/02/23 1200 06/03/23 1406  Weight: 82.6 kg 79.5 kg    Examination:    General: Appearance:    Obese female in no acute distress     Lungs:     respirations unlabored not on O2 any longer  Heart:

## 2023-06-06 DIAGNOSIS — A419 Sepsis, unspecified organism: Secondary | ICD-10-CM | POA: Diagnosis not present

## 2023-06-06 DIAGNOSIS — L0390 Cellulitis, unspecified: Secondary | ICD-10-CM | POA: Diagnosis not present

## 2023-06-06 LAB — GLUCOSE, CAPILLARY
Glucose-Capillary: 177 mg/dL — ABNORMAL HIGH (ref 70–99)
Glucose-Capillary: 192 mg/dL — ABNORMAL HIGH (ref 70–99)
Glucose-Capillary: 160 mg/dL — ABNORMAL HIGH (ref 70–99)
Glucose-Capillary: 193 mg/dL — ABNORMAL HIGH (ref 70–99)

## 2023-06-06 LAB — CBC
HCT: 28.7 % — ABNORMAL LOW (ref 36.0–46.0)
Hemoglobin: 9 g/dL — ABNORMAL LOW (ref 12.0–15.0)
MCH: 30 pg (ref 26.0–34.0)
MCHC: 31.4 g/dL (ref 30.0–36.0)
MCV: 95.7 fL (ref 80.0–100.0)
Platelets: 273 10*3/uL (ref 150–400)
RBC: 3 MIL/uL — ABNORMAL LOW (ref 3.87–5.11)
RDW: 17 % — ABNORMAL HIGH (ref 11.5–15.5)
WBC: 11.2 10*3/uL — ABNORMAL HIGH (ref 4.0–10.5)
nRBC: 0.4 % — ABNORMAL HIGH (ref 0.0–0.2)

## 2023-06-06 LAB — BASIC METABOLIC PANEL
Anion gap: 11 (ref 5–15)
BUN: 10 mg/dL (ref 8–23)
CO2: 25 mmol/L (ref 22–32)
Calcium: 8.6 mg/dL — ABNORMAL LOW (ref 8.9–10.3)
Chloride: 100 mmol/L (ref 98–111)
Creatinine, Ser: 0.91 mg/dL (ref 0.44–1.00)
GFR, Estimated: >60 mL/min (ref >60)
Glucose, Bld: 167 mg/dL — ABNORMAL HIGH (ref 70–99)
Potassium: 3.7 mmol/L (ref 3.5–5.1)
Sodium: 136 mmol/L (ref 135–145)

## 2023-06-06 MED ORDER — TETANUS-DIPHTH-ACELL PERTUSSIS 5-2.5-18.5 LF-MCG/0.5 IM SUSY
0.5000 mL | PREFILLED_SYRINGE | Freq: Once | INTRAMUSCULAR | Status: AC
  Administered 2023-06-08: 0.5 mL via INTRAMUSCULAR
  Filled 2023-06-06: qty 0.5

## 2023-06-06 MED ORDER — AMOXICILLIN-POT CLAVULANATE 875-125 MG PO TABS
1.0000 | ORAL_TABLET | Freq: Two times a day (BID) | ORAL | Status: AC
  Administered 2023-06-06 – 2023-06-07 (×4): 1 via ORAL
  Filled 2023-06-06 (×4): qty 1

## 2023-06-06 MED ORDER — LINAGLIPTIN 5 MG PO TABS
5.0000 mg | ORAL_TABLET | Freq: Every day | ORAL | Status: DC
  Administered 2023-06-06 – 2023-06-11 (×6): 5 mg via ORAL
  Filled 2023-06-06 (×6): qty 1

## 2023-06-06 NOTE — Evaluation (Signed)
Occupational Therapy Evaluation Patient Details Name: Crystal Ashley MRN: [id number] DOB: [birth year] Today's Date: 06/06/2023   History of Present Illness 77 y.o. 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   Clinical Impression   Patient admitted for the diagnosis above.  PTA, per chart, patient was largely bedbound, hoyer lift to wheelchair, was able to feed herself, and was Max A of one for stand pivot transfers to a toilet.  Currently patient is not initiating any movements, and is being fed by staff.  OT can follow in the acute setting to address deficits, and at least see if she can regain self feeding abilities.  OT will recommend SNF for post acute rehab, but if the son can handle the needed 24 hour +2 care needed, home with heavy assist is up to family.  Unclear if she has the capacity to participate meaningfully with skilled rehab, or if the patient is closer to LTC patient at a SNF.         If plan is discharge home, recommend the following: Two people to help with walking and/or transfers;A lot of help with bathing/dressing/bathroom    Functional Status Assessment  Patient has had a recent decline in their functional status and demonstrates the ability to make significant improvements in function in a reasonable and predictable amount of time.  Equipment Recommendations  None recommended by OT    Recommendations for Other Services       Precautions / Restrictions Precautions Precautions: Fall Precaution Comments: DYS 1 with Nectar Thick Liquid Restrictions Weight Bearing Restrictions: No      Mobility Bed Mobility Overal bed mobility: Needs Assistance Bed Mobility: Rolling, Supine to Sit, Sit to Supine Rolling: Total assist, +2 for physical assistance   Supine to sit: +2 for physical assistance Sit to supine: +2 for physical  assistance        Transfers                   General transfer comment: NT      Balance Overall balance assessment: Needs assistance Sitting-balance support: Feet supported Sitting balance-Leahy Scale: Zero   Postural control: Posterior lean, Right lateral lean                                 ADL either performed or assessed with clinical judgement   ADL                                         General ADL Comments: Dep at bedlevel.  No attempted initation of any task or command.  Being fed by staff.     Vision   Vision Assessment?: No apparent visual deficits Additional Comments: Does track and make solid eye contact     Perception Perception: Not tested       Praxis Praxis: Not tested       Pertinent Vitals/Pain Pain Assessment Breathing: normal Negative Vocalization: none Facial Expression: smiling or inexpressive Body Language: relaxed Consolability: no need to console PAINAD Score: 0 Pain Intervention(s): Monitored during session     Extremity/Trunk Assessment Upper Extremity Assessment Upper Extremity Assessment: Generalized weakness;RUE deficits/detail;LUE deficits/detail RUE Deficits / Details: no purposeful AROM noted, arm swollen and stiff to attempted PROM.  Withdrawal to painful stimuli LUE Deficits / Details: no purposeful AROM noted, arm swollen and stiff to attempted PROM.  Withdrawals to painfur stimuli   Lower Extremity Assessment Lower Extremity Assessment: Defer to PT evaluation       Communication Communication Communication: Difficulty communicating thoughts/reduced clarity of speech;Difficulty following commands/understanding Following commands: Follows one step commands inconsistently Cueing Techniques: Verbal cues;Gestural cues;Tactile cues   Cognition Arousal: Alert Behavior During Therapy: Flat affect Overall Cognitive Status: History of cognitive impairments - at baseline                                  General Comments: Non verbal with hx of dementia.  Not following commands, not initiating movements.     General Comments   Thin liquid found on tray table and discarded.  No nectar packets found on tray table.      Exercises     Shoulder Instructions      Home Living Family/patient expects to be discharged to:: Private residence Living Arrangements: Children Available Help at Discharge: Family;Personal care attendant;Available 24 hours/day Type of Home: House Home Access: Ramped entrance     Home Layout: Two level;Able to live on main level with bedroom/bathroom     Bathroom Shower/Tub: Producer, television/film/video: Handicapped height Bathroom Accessibility: Yes How Accessible: Accessible via walker Home Equipment: Wheelchair - manual;Hospital bed   Additional Comments: Product/process development scientist      Prior Functioning/Environment Prior Level of Function : Needs assist             Mobility Comments: Hoyer to wheelchair ADLs Comments: Was able to feed herself, bedlevel ADL via son or aide, Max A stand pivot to 3n1        OT Problem List: Decreased cognition      OT Treatment/Interventions: Self-care/ADL training;Therapeutic activities;Balance training    OT Goals(Current goals can be found in the care plan section) Acute Rehab OT Goals OT Goal Formulation: Patient unable to participate in goal setting Time For Goal Achievement: 06/21/23 Potential to Achieve Goals: Good ADL Goals Pt Will Perform Eating: with supervision;sitting;with set-up Pt Will Transfer to Toilet: stand pivot transfer;with max assist;bedside commode  OT Frequency: Min 1X/week    Co-evaluation              AM-PAC OT "6 Clicks" Daily Activity     Outcome Measure Help from another person eating meals?: Total Help from another person taking care of personal grooming?: Total Help from another person toileting, which includes using toliet, bedpan, or urinal?:  Total Help from another person bathing (including washing, rinsing, drying)?: Total Help from another person to put on and taking off regular upper body clothing?: Total Help from another person to put on and taking off regular lower body clothing?: Total 6 Click Score: 6   End of Session Nurse Communication: Other (comment) (Patient soiled)  Activity Tolerance: Patient tolerated treatment well Patient left: in bed;with call bell/phone within reach  OT Visit Diagnosis: Other symptoms and signs involving cognitive function                Time: 1324-4010 OT Time Calculation (min): 16 min Charges:  OT General Charges $OT Visit: 1 Visit OT Evaluation $OT Eval Moderate Complexity: 1 Mod  06/06/2023  RP, OTR/L  Acute Rehabilitation Services  Office:  [phone]   Suzanna Obey 06/06/2023, 4:24 PM

## 2023-06-06 NOTE — Plan of Care (Signed)

## 2023-06-06 NOTE — Progress Notes (Signed)
PROGRESS NOTE    Crystal Ashley  ZOX:[id number] DOB: 12-Dec-1945 DOA: 06/02/2023 PCP: Ardith Dark, MD    Brief Narrative:  Crystal Ashley is a 77 y.o. 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  Pt unable to provide history as she was non-verbal which is her baseline. Son provides hx over the phone. He is her caretaker and she also has day time aid. Pt is mostly bed-bound and has not ambulate in a year and a half. Today she was not helping with transfer. Son usually puts a belt around her and she helps push up on bed rails but today was just slumped over. She also was very diaphoretic. Did not cut up her breakfast as she usually does. Appeared weak. Only ate 2 bites and appear to want to vomit.    Assessment and Plan: Sepsis due to cellulitis vs aspiration PNA -most likely secondary to cellulitis sacral ulcer wounds as her CXR, CT A/P, UA, head CT was negative. No other obvious source except for wounds.  -administered IV vancomycin and cefepime in ED.- IV meds that cover aspiration -wound care consulted: Prevalon boots to bilateral heels Low air loss bed   AKI (acute kidney injury) (HCC) -resolved  Chronic atrial fibrillation with RVR (HCC) -change to PO meds-- one that can be crushed -resume Xarelto   Dysphagia -son reports difficulty with swallowing pills at home for several months -while receiving Lokelma solution in ED, pt appears to have aspirated and had oxygen desaturation down to mid-80s. Placed on oxygen supplementation briefly with improvement -obtain speech eval- DYS 1 nectar thick -per son, would NOT want feeding tube   Acute metabolic encephalopathy -secondary to dehydration/aspiration? -appears to be back at baseline  Hyperkalemia -resolved  Hyponatremia -trend  Uncontrolled type 2 diabetes mellitus with  hyperglycemia, without long-term current use of insulin (HCC) -recent A1C of 9.5 and was restarted on metformin recently -add tradjenta-- will see if covered by insurance -place on SSI and asjust meds  Hyperlipidemia associated with type 2 diabetes mellitus (HCC) -no longer on statin per cardiology as pt wanted to decrease medication burden  Hypothyroidism -Continue levothyroxine   Hypertension associated with diabetes (HCC) -hold diuretic due to AKI  PT/OT eval and hope for d/c in AM     DVT prophylaxis:  rivaroxaban (XARELTO) tablet 20 mg    Code Status: Limited: Do not attempt resuscitation (DNR) -DNR-LIMITED -Do Not Intubate/DNI  Family Communication: called son  Disposition Plan:  Level of care: Telemetry Cardiac Status is: Inpatient Remains inpatient appropriate     Consultants:  SLP   Subjective: no overnight events  Objective: Vitals:   06/05/23 1934 06/05/23 2032 06/06/23 0247 06/06/23 0848  BP: 123/75  (!) 149/87 133/87  Pulse: 84 (!) 104 80   Resp: 19  18 18   Temp: 97.8 F (36.6 C)  98.7 F (37.1 C)   TempSrc: Oral  Oral   SpO2: 100%   100%  Weight:      Height:        Intake/Output Summary (Last 24 hours) at 06/06/2023 1205 Last data filed at 06/05/2023 1935 Gross per 24 hour  Intake --  Output 500 ml  Net -500 ml   Filed Weights   06/02/23 1200 06/03/23 1406  Weight: 82.6 kg 79.5 kg    Examination:    General: Appearance:    Obese female in no acute  distress     Lungs:     respirations unlabored not on O2 any longer  Heart:    Normal heart rate. .   MS:   All extremities are intact.   Neurologic:   Awake, alert       Data Reviewed: I have personally reviewed following labs and imaging studies  CBC: Recent Labs  Lab 06/03/23 0551 06/03/23 0653 06/04/23 0613 06/05/23 0523 06/06/23 0444  WBC 29.9* 21.0* 12.7* 13.8* 11.2*  HGB 5.0* 9.1* 8.7* 9.1* 9.0*  HCT 15.6* 28.0* 26.9* 28.6* 28.7*  MCV 99.4 94.0 94.1 95.7 95.7   PLT 355 267 252 268 273   Basic Metabolic Panel: Recent Labs  Lab 06/02/23 1104 06/02/23 1111 06/03/23 0551 06/04/23 0613 06/05/23 0523 06/06/23 0444  NA 132* 130* 135 135 139 136  K 5.3* 5.2* 3.8 3.8 3.5 3.7  CL 92* 93* 96* 98 97* 100  CO2 25  --  23 25 25 25   GLUCOSE 375* 381* 158* 160* 114* 167*  BUN 26* 27* 22 20 17 10   CREATININE 1.36* 1.20* 1.27* 1.32* 0.99 0.91  CALCIUM 9.1  --  8.9 8.4* 9.5 8.6*   GFR: Estimated Creatinine Clearance: 48.3 mL/min (by C-G formula based on SCr of 0.91 mg/dL). Liver Function Tests: Recent Labs  Lab 06/02/23 1104  AST 25  ALT 26  ALKPHOS 136*  BILITOT 0.9  PROT 7.2  ALBUMIN 3.0*   Recent Labs  Lab 06/02/23 1104  LIPASE 24   Recent Labs  Lab 06/02/23 1106  AMMONIA 27   Coagulation Profile: No results for input(s): "INR", "PROTIME" in the last 168 hours. Cardiac Enzymes: No results for input(s): "CKTOTAL", "CKMB", "CKMBINDEX", "TROPONINI" in the last 168 hours. BNP (last 3 results) No results for input(s): "PROBNP" in the last 8760 hours. HbA1C: No results for input(s): "HGBA1C" in the last 72 hours. CBG: Recent Labs  Lab 06/05/23 1131 06/05/23 1647 06/05/23 2302 06/06/23 0819 06/06/23 1203  GLUCAP 243* 274* 209* 177* 192*   Lipid Profile: No results for input(s): "CHOL", "HDL", "LDLCALC", "TRIG", "CHOLHDL", "LDLDIRECT" in the last 72 hours. Thyroid Function Tests: No results for input(s): "TSH", "T4TOTAL", "FREET4", "T3FREE", "THYROIDAB" in the last 72 hours.  Anemia Panel: No results for input(s): "VITAMINB12", "FOLATE", "FERRITIN", "TIBC", "IRON", "RETICCTPCT" in the last 72 hours. Sepsis Labs: Recent Labs  Lab 06/02/23 1112 06/02/23 1410 06/02/23 2227 06/03/23 0556  LATICACIDVEN 3.8* 3.1* 1.7 1.5    No results found for this or any previous visit (from the past 240 hour(s)).       Radiology Studies: No results found.      Scheduled Meds:  dorzolamide-timolol  1 drop Both Eyes BID    insulin aspart  0-20 Units Subcutaneous TID WC   insulin aspart  0-5 Units Subcutaneous QHS   latanoprost  1 drop Both Eyes QHS   levothyroxine  75 mcg Oral Q0600   metFORMIN  500 mg Oral Q breakfast   metoprolol tartrate  37.5 mg Oral BID   rivaroxaban  20 mg Oral Q supper   Continuous Infusions:  ampicillin-sulbactam (UNASYN) IV 3 g (06/06/23 0852)     LOS: 4 days    Time spent: 45 minutes spent on chart review, discussion with nursing staff, consultants, updating family and interview/physical exam; more than 50% of that time was spent in counseling and/or coordination of care.    Joseph Art, DO [facility] Available via Epic secure chat 7am-7pm After these hours, please refer to coverage  provider listed on [email or web address] 06/06/2023, 12:05 PM

## 2023-06-07 ENCOUNTER — Other Ambulatory Visit (HOSPITAL_COMMUNITY): Payer: Self-pay

## 2023-06-07 DIAGNOSIS — A419 Sepsis, unspecified organism: Secondary | ICD-10-CM | POA: Diagnosis not present

## 2023-06-07 DIAGNOSIS — L0390 Cellulitis, unspecified: Secondary | ICD-10-CM | POA: Diagnosis not present

## 2023-06-07 LAB — GLUCOSE, CAPILLARY
Glucose-Capillary: 315 mg/dL — ABNORMAL HIGH (ref 70–99)
Glucose-Capillary: 271 mg/dL — ABNORMAL HIGH (ref 70–99)
Glucose-Capillary: 170 mg/dL — ABNORMAL HIGH (ref 70–99)
Glucose-Capillary: 159 mg/dL — ABNORMAL HIGH (ref 70–99)

## 2023-06-07 MED ORDER — METOPROLOL TARTRATE 50 MG PO TABS: 50.0000 mg | ORAL_TABLET | Freq: Two times a day (BID) | ORAL | Status: DC

## 2023-06-07 MED ORDER — FOOD THICKENER (SIMPLYTHICK): 1.0000 | ORAL | Status: DC | PRN

## 2023-06-07 MED ORDER — METOPROLOL TARTRATE 12.5 MG HALF TABLET
12.5000 mg | ORAL_TABLET | Freq: Once | ORAL | Status: AC
  Administered 2023-06-07: 12.5 mg via ORAL
  Filled 2023-06-07: qty 1

## 2023-06-07 MED ORDER — METFORMIN HCL 500 MG PO TABS
500.0000 mg | ORAL_TABLET | Freq: Two times a day (BID) | ORAL | Status: DC
  Administered 2023-06-07 – 2023-06-11 (×8): 500 mg via ORAL
  Filled 2023-06-07 (×8): qty 1

## 2023-06-07 MED ORDER — METOPROLOL TARTRATE 25 MG PO TABS: 37.5000 mg | ORAL_TABLET | Freq: Two times a day (BID) | ORAL | Status: DC

## 2023-06-07 NOTE — Inpatient Diabetes Management (Signed)
Inpatient Diabetes Program Recommendations  AACE/ADA: New Consensus Statement on Inpatient Glycemic Control (2015)  Target Ranges:  Prepandial:   less than 140 mg/dL      Peak postprandial:   less than 180 mg/dL (1-2 hours)      Critically ill patients:  140 - 180 mg/dL   Lab Results  Component Value Date   GLUCAP 271 (H) 06/07/2023   HGBA1C 9.5 (A) 05/20/2023    Review of Glycemic Control  Latest Reference Range & Units 06/06/23 16:13 06/06/23 20:20 06/07/23 07:23 06/07/23 11:48  Glucose-Capillary 70 - 99 mg/dL 161 (H) 096 (H) 045 (H) 271 (H)  (H): Data is abnormally high Diabetes history: Type 2 DM Outpatient Diabetes medications: Metformin 500 mg QD Current orders for Inpatient glycemic control: Metformin 500 mg BID, Tradjenta 5 mg QD  Inpatient Diabetes Program Recommendations:    Noted consult for discharge recommendations.  Patient has been consuming dysphasia diet while inpatient, but not mindful of CHO.  Unless clinical picture fully supports basal insulin, would recommend continuing with current orders at discharge.  Secure chat sent to MD.   Thanks, Lujean Rave, MSN, RNC-OB Diabetes Coordinator [phone] (8a-5p)

## 2023-06-07 NOTE — Progress Notes (Signed)
Speech Language Pathology Treatment: Dysphagia  Patient Details Name: Crystal Ashley MRN: [id number] DOB: 1946/06/18 Today's Date: 06/07/2023 Time: 1039-1050 SLP Time Calculation (min) (ACUTE ONLY): 11 min  Assessment / Plan / Recommendation Clinical Impression  No clinical changes in swallowing noted since MBS last week. Pt smiling, non-verbal, accepted POs from clinician with adequate oral attention and manipulation; drank nectar thick liquids from straw with occasional mild coughing noted after a delay.  Pt's RN reports good intake at breakfast with no overt difficulty swallowing. She may be at her new baseline with regard to dysphagia.  If she is drinking enough fluids despite restriction to nectars, she may benefit from remaining on this diet at D/C. Continue dysphagia 1/nectars; crush meds. New sign posted at [facility].  SLP will follow while admitted.   HPI HPI: Crystal Ashley is a 77 y.o. female who presented with altered mental status. CXR and Head CT 10/16 without acute findings. Per chart review pt is mostly bedbound, but was feeding herself and cutting up her own food pta. Pt with MBS in Oct of 2021 with concern for esophageal dysphagia and reflux to pharynx; recs for reg diet with f/u for esophageal issues as needed. 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      SLP Plan  Continue with current plan of care      Recommendations for follow up therapy are one component of a multi-disciplinary discharge planning process, led by the attending physician.  Recommendations may be updated based on patient status, additional functional criteria and insurance authorization.    Recommendations  Diet recommendations: Dysphagia 1 (puree);Nectar-thick liquid Liquids provided via: Cup;Straw Medication Administration: Crushed with puree Supervision: Staff to assist with self feeding;Full  supervision/cueing for compensatory strategies;Trained caregiver to feed patient Compensations: Minimize environmental distractions;Slow rate;Small sips/bites Postural Changes and/or Swallow Maneuvers: Seated upright 90 degrees;Upright 30-60 min after meal                  Oral care QID   Frequent or constant Supervision/Assistance Dysphagia, oropharyngeal phase (R13.12)     Continue with current plan of care   Elizandro Laura L. Samson Frederic, MA CCC/SLP Clinical Specialist - Acute Care SLP Acute Rehabilitation Services Office number [phone]   Blenda Mounts Laurice  06/07/2023, 10:55 AM

## 2023-06-07 NOTE — Evaluation (Signed)
Physical Therapy Evaluation Patient Details Name: Crystal Ashley MRN: [id number] DOB: 30-Jul-1946 Today's Date: 06/07/2023  History of Present Illness  77 y.o. 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  Clinical Impression  Pt is presenting slightly below baseline level of functioning. Prior to hospitalization pt was receiving max A for stand pivot transfers to [facility]. Currently pt is unable to perform standing and is Max to Min A sitting EOB for balance due to poor kinesthetic awareness. Due to pt current functional status, home set up and available assistance at home recommending skilled physical therapy services 3x/weekly on discharge from acute care hospital setting in order to decrease risk for falls, injury, immobility, skin break down and rehospitalization. Pt was fatigued at end of session and initiated going to supine.       If plan is discharge home, recommend the following: Two people to help with walking and/or transfers;Assist for transportation;Assistance with cooking/housework;Help with stairs or ramp for entrance     Equipment Recommendations None recommended by PT     Functional Status Assessment Patient has had a recent decline in their functional status and demonstrates the ability to make significant improvements in function in a reasonable and predictable amount of time.     Precautions / Restrictions Precautions Precautions: Fall Precaution Comments: DYS 1 with Nectar Thick Liquid Restrictions Weight Bearing Restrictions: No      Mobility  Bed Mobility Overal bed mobility: Needs Assistance Bed Mobility: Rolling, Supine to Sit, Sit to Supine Rolling: Total assist, +2 for physical assistance   Supine to sit: +2 for physical assistance Sit to supine: +2 for physical assistance   General bed mobility comments: Pt was Total A for  going from supine<>sitting. Pt was able to squeeze physical therapist hand with visual for action aand perform ankle pumps on the L with visual. Pt was Max A to Min A for balance sitting EOB.    Transfers   General transfer comment: Unable at this time    Ambulation/Gait     General Gait Details: pt is non-ambulatory at baseline.      Balance Overall balance assessment: Needs assistance Sitting-balance support: Feet supported, No upper extremity supported Sitting balance-Leahy Scale: Zero Sitting balance - Comments: requires external support to remain sitting. Postural control: Posterior lean, Right lateral lean         Pertinent Vitals/Pain Pain Assessment Pain Assessment: Faces Faces Pain Scale: No hurt Breathing: normal Negative Vocalization: none Facial Expression: smiling or inexpressive Body Language: relaxed Consolability: no need to console PAINAD Score: 0 Pain Intervention(s): Monitored during session    Home Living Family/patient expects to be discharged to:: Private residence Living Arrangements: Children Available Help at Discharge: Family;Personal care attendant;Available 24 hours/day Type of Home: House Home Access: Ramped entrance       Home Layout: Two level;Able to live on main level with bedroom/bathroom Home Equipment: Wheelchair - manual;Hospital bed Additional Comments: Product/process development scientist    Prior Function Prior Level of Function : Needs assist             Mobility Comments: Michiel Sites to wheelchair ADLs Comments: Was able to feed herself, bedlevel ADL via son or aide, Max A stand pivot to 3n1     Extremity/Trunk Assessment   Upper Extremity Assessment Upper Extremity Assessment: Defer to OT evaluation    Lower Extremity Assessment Lower Extremity Assessment: Generalized weakness    Cervical /  Trunk Assessment Cervical / Trunk Assessment: Normal  Communication   Communication Communication: Difficulty communicating thoughts/reduced clarity  of speech;Difficulty following commands/understanding Following commands: Follows one step commands inconsistently Cueing Techniques: Verbal cues;Gestural cues;Tactile cues  Cognition Arousal: Alert Behavior During Therapy: Flat affect Overall Cognitive Status: History of cognitive impairments - at baseline       General Comments: Non verbal with hx of dementia.  Not following commands, not initiating movements.        General Comments General comments (skin integrity, edema, etc.): Pt was intermittently able to follow commands and was reponsive to first name.        Assessment/Plan    PT Assessment Patient needs continued PT services  PT Problem List Decreased mobility;Decreased activity tolerance       PT Treatment Interventions Therapeutic exercise;Functional mobility training;Therapeutic activities;Patient/family education    PT Goals (Current goals can be found in the Care Plan section)  Acute Rehab PT Goals Patient Stated Goal: to return home with family - per PT PT Goal Formulation: Patient unable to participate in goal setting Time For Goal Achievement: 06/21/23 Potential to Achieve Goals: Fair Additional Goals Additional Goal #1: Will sit EOB at SBA for 5 min or greater with or w/o UE support.    Frequency Min 1X/week        AM-PAC PT "6 Clicks" Mobility  Outcome Measure Help needed turning from your back to your side while in a flat bed without using bedrails?: Total Help needed moving from lying on your back to sitting on the side of a flat bed without using bedrails?: Total Help needed moving to and from a bed to a chair (including a wheelchair)?: Total Help needed standing up from a chair using your arms (e.g., wheelchair or bedside chair)?: Total Help needed to walk in hospital room?: Total Help needed climbing 3-5 steps with a railing? : Total 6 Click Score: 6    End of Session   Activity Tolerance: Patient tolerated treatment well;Patient limited  by fatigue Patient left: in bed;with call bell/phone within reach;with bed alarm set Nurse Communication: Mobility status;Other (comment) (pt had bowel movement, notified nursing tech) PT Visit Diagnosis: Other abnormalities of gait and mobility (R26.89)    Time: 1610-9604 PT Time Calculation (min) (ACUTE ONLY): 26 min   Charges:   PT Evaluation $PT Eval Low Complexity: 1 Low PT Treatments $Therapeutic Activity: 8-22 mins PT General Charges $$ ACUTE PT VISIT: 1 Visit    Crystal Ashley, DPT, CLT  Acute Rehabilitation Services Office: [phone] (Secure chat preferred)   Claudia Desanctis 06/07/2023, 2:58 PM

## 2023-06-07 NOTE — Progress Notes (Signed)
PROGRESS NOTE    Crystal Ashley  ZOX:[id number] DOB: 31-Mar-1946 DOA: 06/02/2023 PCP: Ardith Dark, MD    Brief Narrative:  Crystal Ashley is a 77 y.o. 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  Pt unable to provide history as she was non-verbal which is her baseline. Son provides hx over the phone. He is her caretaker and she also has day time aid. Pt is mostly bed-bound and has not ambulate in a year and a half. Today she was not helping with transfer. Son usually puts a belt around her and she helps push up on bed rails but today was just slumped over. She also was very diaphoretic. Did not cut up her breakfast as she usually does. Appeared weak. Only ate 2 bites and appear to want to vomit.    Assessment and Plan: Sepsis due to cellulitis vs aspiration PNA -most likely secondary to cellulitis sacral ulcer wounds as her CXR, CT A/P, UA, head CT was negative. No other obvious source except for wounds.  -change to PO abx -wound care consulted: Prevalon boots to bilateral heels Low air loss bed   AKI (acute kidney injury) (HCC) -resolved  Chronic atrial fibrillation with RVR (HCC) -change to PO meds-- one that can be crushed-- adjust for better HR control-- did not tolerate 50mg  -resume Xarelto   Dysphagia -son reports difficulty with swallowing pills at home for several months -while receiving Lokelma solution in ED, pt appears to have aspirated and had oxygen desaturation down to mid-80s. Placed on oxygen supplementation briefly with improvement -obtain speech eval- DYS 1 nectar thick -per son, would NOT want feeding tube   Acute metabolic encephalopathy -secondary to dehydration/aspiration? -appears to be back at baseline  Hyperkalemia -resolved  Hyponatremia -trend  Uncontrolled type 2 diabetes mellitus with hyperglycemia,  without long-term current use of insulin (HCC) -recent A1C of 9.5 and was restarted on metformin recently--increase dose -tradjenta >100$ due to do-nut hole  Hyperlipidemia associated with type 2 diabetes mellitus (HCC) -no longer on statin per cardiology as pt wanted to decrease medication burden  Hypothyroidism -Continue levothyroxine   Hypertension associated with diabetes (HCC) -resume home meds as able  PT/OT eval and hope for d/c in AM     DVT prophylaxis:  rivaroxaban (XARELTO) tablet 20 mg    Code Status: Limited: Do not attempt resuscitation (DNR) -DNR-LIMITED -Do Not Intubate/DNI  Family Communication: called son  Disposition Plan:  Level of care: Telemetry Cardiac Status is: Inpatient Remains inpatient appropriate     Consultants:  SLP   Subjective: Appears to be at baseline  Objective: Vitals:   06/07/23 0849 06/07/23 1050 06/07/23 1150 06/07/23 1208  BP: (!) 132/98 95/61 90/60  (!) 90/55  Pulse: (!) 116 (!) 103 (!) 107 99  Resp:   20   Temp:   97.8 F (36.6 C)   TempSrc:   Oral   SpO2:   100%   Weight:      Height:        Intake/Output Summary (Last 24 hours) at 06/07/2023 1324 Last data filed at 06/07/2023 0944 Gross per 24 hour  Intake 418 ml  Output 200 ml  Net 218 ml   Filed Weights   06/02/23 1200 06/03/23 1406  Weight: 82.6 kg 79.5 kg    Examination:    General: Appearance:    Obese female in no acute distress  Lungs:     respirations unlabored not on O2 any longer  Heart:    Normal heart rate. .   MS:   All extremities are intact.   Neurologic:   Awake, alert       Data Reviewed: I have personally reviewed following labs and imaging studies  CBC: Recent Labs  Lab 06/03/23 0551 06/03/23 0653 06/04/23 0613 06/05/23 0523 06/06/23 0444  WBC 29.9* 21.0* 12.7* 13.8* 11.2*  HGB 5.0* 9.1* 8.7* 9.1* 9.0*  HCT 15.6* 28.0* 26.9* 28.6* 28.7*  MCV 99.4 94.0 94.1 95.7 95.7  PLT 355 267 252 268 273   Basic Metabolic  Panel: Recent Labs  Lab 06/02/23 1104 06/02/23 1111 06/03/23 0551 06/04/23 0613 06/05/23 0523 06/06/23 0444  NA 132* 130* 135 135 139 136  K 5.3* 5.2* 3.8 3.8 3.5 3.7  CL 92* 93* 96* 98 97* 100  CO2 25  --  23 25 25 25   GLUCOSE 375* 381* 158* 160* 114* 167*  BUN 26* 27* 22 20 17 10   CREATININE 1.36* 1.20* 1.27* 1.32* 0.99 0.91  CALCIUM 9.1  --  8.9 8.4* 9.5 8.6*   GFR: Estimated Creatinine Clearance: 48.3 mL/min (by C-G formula based on SCr of 0.91 mg/dL). Liver Function Tests: Recent Labs  Lab 06/02/23 1104  AST 25  ALT 26  ALKPHOS 136*  BILITOT 0.9  PROT 7.2  ALBUMIN 3.0*   Recent Labs  Lab 06/02/23 1104  LIPASE 24   Recent Labs  Lab 06/02/23 1106  AMMONIA 27   Coagulation Profile: No results for input(s): "INR", "PROTIME" in the last 168 hours. Cardiac Enzymes: No results for input(s): "CKTOTAL", "CKMB", "CKMBINDEX", "TROPONINI" in the last 168 hours. BNP (last 3 results) No results for input(s): "PROBNP" in the last 8760 hours. HbA1C: No results for input(s): "HGBA1C" in the last 72 hours. CBG: Recent Labs  Lab 06/06/23 1203 06/06/23 1613 06/06/23 2020 06/07/23 0723 06/07/23 1148  GLUCAP 192* 160* 193* 315* 271*   Lipid Profile: No results for input(s): "CHOL", "HDL", "LDLCALC", "TRIG", "CHOLHDL", "LDLDIRECT" in the last 72 hours. Thyroid Function Tests: No results for input(s): "TSH", "T4TOTAL", "FREET4", "T3FREE", "THYROIDAB" in the last 72 hours.  Anemia Panel: No results for input(s): "VITAMINB12", "FOLATE", "FERRITIN", "TIBC", "IRON", "RETICCTPCT" in the last 72 hours. Sepsis Labs: Recent Labs  Lab 06/02/23 1112 06/02/23 1410 06/02/23 2227 06/03/23 0556  LATICACIDVEN 3.8* 3.1* 1.7 1.5    No results found for this or any previous visit (from the past 240 hour(s)).       Radiology Studies: No results found.      Scheduled Meds:  amoxicillin-clavulanate  1 tablet Oral BID   dorzolamide-timolol  1 drop Both Eyes BID    insulin aspart  0-20 Units Subcutaneous TID WC   insulin aspart  0-5 Units Subcutaneous QHS   latanoprost  1 drop Both Eyes QHS   levothyroxine  75 mcg Oral Q0600   linagliptin  5 mg Oral Daily   metFORMIN  500 mg Oral BID WC   metoprolol tartrate  37.5 mg Oral BID   rivaroxaban  20 mg Oral Q supper   Tdap  0.5 mL Intramuscular Once   Continuous Infusions:     LOS: 5 days    Time spent: 45 minutes spent on chart review, discussion with nursing staff, consultants, updating family and interview/physical exam; more than 50% of that time was spent in counseling and/or coordination of care.    Joseph Art, DO [facility] Available via  Epic secure chat 7am-7pm After these hours, please refer to coverage provider listed on [email or web address] 06/07/2023, 1:24 PM

## 2023-06-07 NOTE — TOC Benefit Eligibility Note (Signed)
Patient Product/process development scientist completed.    The patient is insured through [employer] [place]. Patient has Medicare and is not eligible for a copay card, but may be able to apply for patient assistance, if available.    Ran test claim for Brilinta 90 mg and the current 30 day co-pay is $106.11 due to being in Coverage Gap (donut hole).   This test claim was processed through [facility]- copay amounts may vary at other pharmacies due to pharmacy/plan contracts, or as the patient moves through the different stages of their insurance plan.     Roland Earl, CPHT Pharmacy Technician III Certified Patient Advocate [facility] Pharmacy Patient Advocate Team Direct Number: [phone]  Fax: [phone]

## 2023-06-08 ENCOUNTER — Telehealth: Payer: Self-pay | Admitting: Family Medicine

## 2023-06-08 DIAGNOSIS — A419 Sepsis, unspecified organism: Secondary | ICD-10-CM | POA: Diagnosis not present

## 2023-06-08 DIAGNOSIS — L0390 Cellulitis, unspecified: Secondary | ICD-10-CM | POA: Diagnosis not present

## 2023-06-08 LAB — BASIC METABOLIC PANEL
Anion gap: 11 (ref 5–15)
BUN: 17 mg/dL (ref 8–23)
CO2: 21 mmol/L — ABNORMAL LOW (ref 22–32)
Calcium: 8.2 mg/dL — ABNORMAL LOW (ref 8.9–10.3)
Chloride: 102 mmol/L (ref 98–111)
Creatinine, Ser: 1.14 mg/dL — ABNORMAL HIGH (ref 0.44–1.00)
GFR, Estimated: 50 mL/min — ABNORMAL LOW (ref >60)
Glucose, Bld: 158 mg/dL — ABNORMAL HIGH (ref 70–99)
Potassium: 4.2 mmol/L (ref 3.5–5.1)
Sodium: 134 mmol/L — ABNORMAL LOW (ref 135–145)

## 2023-06-08 LAB — CBC
HCT: 19.5 % — ABNORMAL LOW (ref 36.0–46.0)
HCT: 17.7 % — ABNORMAL LOW (ref 36.0–46.0)
Hemoglobin: 6 g/dL — CL (ref 12.0–15.0)
Hemoglobin: 5.8 g/dL — CL (ref 12.0–15.0)
MCH: 30.5 pg (ref 26.0–34.0)
MCH: 32 pg (ref 26.0–34.0)
MCHC: 30.8 g/dL (ref 30.0–36.0)
MCHC: 32.8 g/dL (ref 30.0–36.0)
MCV: 99 fL (ref 80.0–100.0)
MCV: 97.8 fL (ref 80.0–100.0)
Platelets: 309 10*3/uL (ref 150–400)
Platelets: 262 10*3/uL (ref 150–400)
RBC: 1.97 MIL/uL — ABNORMAL LOW (ref 3.87–5.11)
RBC: 1.81 MIL/uL — ABNORMAL LOW (ref 3.87–5.11)
RDW: 18.5 % — ABNORMAL HIGH (ref 11.5–15.5)
RDW: 18.5 % — ABNORMAL HIGH (ref 11.5–15.5)
WBC: 23.9 10*3/uL — ABNORMAL HIGH (ref 4.0–10.5)
WBC: 21.6 10*3/uL — ABNORMAL HIGH (ref 4.0–10.5)
nRBC: 3.6 % — ABNORMAL HIGH (ref 0.0–0.2)
nRBC: 3.3 % — ABNORMAL HIGH (ref 0.0–0.2)

## 2023-06-08 LAB — GLUCOSE, CAPILLARY
Glucose-Capillary: 227 mg/dL — ABNORMAL HIGH (ref 70–99)
Glucose-Capillary: 212 mg/dL — ABNORMAL HIGH (ref 70–99)
Glucose-Capillary: 141 mg/dL — ABNORMAL HIGH (ref 70–99)
Glucose-Capillary: 234 mg/dL — ABNORMAL HIGH (ref 70–99)

## 2023-06-08 LAB — PREPARE RBC (CROSSMATCH)

## 2023-06-08 LAB — VITAMIN B12: Vitamin B-12: 3168 pg/mL — ABNORMAL HIGH (ref 180–914)

## 2023-06-08 MED ORDER — METOPROLOL TARTRATE 25 MG PO TABS
25.0000 mg | ORAL_TABLET | Freq: Two times a day (BID) | ORAL | Status: DC
  Administered 2023-06-08 – 2023-06-09 (×2): 25 mg via ORAL
  Filled 2023-06-08 (×2): qty 1

## 2023-06-08 MED ORDER — ALBUMIN HUMAN 25 % IV SOLN
25.0000 g | Freq: Once | INTRAVENOUS | Status: AC
  Administered 2023-06-08: 25 g via INTRAVENOUS
  Filled 2023-06-08: qty 100

## 2023-06-08 MED ORDER — SODIUM CHLORIDE 0.9% IV SOLUTION
Freq: Once | INTRAVENOUS | Status: AC
Start: 2023-06-08 — End: 2023-06-09

## 2023-06-08 MED ORDER — SODIUM CHLORIDE 0.9% FLUSH
10.0000 mL | INTRAVENOUS | Status: DC | PRN
Start: 2023-06-08 — End: 2023-06-11

## 2023-06-08 MED ORDER — SODIUM CHLORIDE 0.9% FLUSH
10.0000 mL | Freq: Two times a day (BID) | INTRAVENOUS | Status: DC
  Administered 2023-06-09 – 2023-06-11 (×6): 10 mL

## 2023-06-08 MED ORDER — SODIUM CHLORIDE 0.9% IV SOLUTION: Freq: Once | INTRAVENOUS | Status: AC

## 2023-06-08 MED ORDER — SODIUM CHLORIDE 0.9 % IV BOLUS
250.0000 mL | Freq: Once | INTRAVENOUS | Status: AC
  Administered 2023-06-08: 250 mL via INTRAVENOUS

## 2023-06-08 NOTE — Plan of Care (Signed)

## 2023-06-08 NOTE — Progress Notes (Signed)
PROGRESS NOTE    Crystal Ashley  GEX:[id number] DOB: 1946/03/24 DOA: 06/02/2023 PCP: Ardith Dark, MD    Brief Narrative:  Crystal Ashley is a 77 y.o. 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  Pt unable to provide history as she was non-verbal which is her baseline. Son provides hx over the phone. He is her caretaker and she also has day time aid. Pt is mostly bed-bound and has not ambulate in a year and a half but can feed self. Today she was not helping with transfer. Son usually puts a belt around her and she helps push up on bed rails but today was just slumped over. She also was very diaphoretic. Did not cut up her breakfast as she usually does. Appeared weak. Only ate 2 bites and appear to want to vomit.  Found to have dysphagia and aspiration PNA.  Stay complicated by hypotension from meds./poor PO intake.     Assessment and Plan: Sepsis due to cellulitis vs aspiration PNA -wounds healing-- dressings per WOC -changed to PO abx and finish course -wound care consulted: Prevalon boots to bilateral heels Low air loss bed   AKI (acute kidney injury) (HCC) -resolved -labs pending today  Chronic atrial fibrillation with RVR (HCC) -change to PO meds-- one that can be crushed-- adjust for better HR control-- did not tolerate >25mg  -resumed Xarelto   Dysphagia -son reports difficulty with swallowing pills at home for several months -while receiving Lokelma solution in ED, pt appears to have aspirated and had oxygen desaturation down to mid-80s. Placed on oxygen supplementation briefly with improvement -obtain speech eval- DYS 1 nectar thick -per son, would NOT want feeding tube -nutrition consult to educate son on diet  Hypotension -IV albumin x 1 and monitor response  Acute metabolic encephalopathy -secondary to  dehydration/aspiration? -appears to be back at baseline  Hyperkalemia -resolved  Hyponatremia -trend  Uncontrolled type 2 diabetes mellitus with hyperglycemia, without long-term current use of insulin (HCC) -recent A1C of 9.5 and was restarted on metformin recently--increase dose- and follow up outpatient  -tradjenta >100$ due to donut hole  Hyperlipidemia associated with type 2 diabetes mellitus (HCC) -no longer on statin per cardiology as pt wanted to decrease medication burden  Hypothyroidism -Continue levothyroxine   Hypertension associated with diabetes (HCC) -resume home meds as able  Home in AM if BP stable     DVT prophylaxis:  rivaroxaban (XARELTO) tablet 20 mg    Code Status: Limited: Do not attempt resuscitation (DNR) -DNR-LIMITED -Do Not Intubate/DNI  Family Communication: son at bedside  Disposition Plan:  Level of care: Telemetry Cardiac Status is: Inpatient Remains inpatient appropriate     Consultants:  SLP   Subjective: BP on lower side  Objective: Vitals:   06/08/23 0741 06/08/23 1143 06/08/23 1235 06/08/23 1523  BP: (!) 94/53 97/66 (!) 93/56 99/79  Pulse: 100 98  93  Resp: (!) 22     Temp: 98 F (36.7 C) 98.3 F (36.8 C)  97.9 F (36.6 C)  TempSrc: Oral Oral  Oral  SpO2: 98% 100%  99%  Weight:      Height:        Intake/Output Summary (Last 24 hours) at 06/08/2023 1535 Last data filed at 06/08/2023 1501 Gross per 24 hour  Intake 427 ml  Output --  Net 427 ml   Filed Weights   06/02/23 1200  06/03/23 1406  Weight: 82.6 kg 79.5 kg    Examination:     General: Appearance:    Obese female in no acute distress   Swelling in arms  Lungs:      respirations unlabored  Heart:    Normal heart rate.    MS:   All extremities are intact.   Neurologic:   Awake, alert- non-verbal most of the time       Data Reviewed: I have personally reviewed following labs and imaging studies  CBC: Recent Labs  Lab 06/03/23 0551  06/03/23 0653 06/04/23 0613 06/05/23 0523 06/06/23 0444  WBC 29.9* 21.0* 12.7* 13.8* 11.2*  HGB 5.0* 9.1* 8.7* 9.1* 9.0*  HCT 15.6* 28.0* 26.9* 28.6* 28.7*  MCV 99.4 94.0 94.1 95.7 95.7  PLT 355 267 252 268 273   Basic Metabolic Panel: Recent Labs  Lab 06/02/23 1104 06/02/23 1111 06/03/23 0551 06/04/23 0613 06/05/23 0523 06/06/23 0444  NA 132* 130* 135 135 139 136  K 5.3* 5.2* 3.8 3.8 3.5 3.7  CL 92* 93* 96* 98 97* 100  CO2 25  --  23 25 25 25   GLUCOSE 375* 381* 158* 160* 114* 167*  BUN 26* 27* 22 20 17 10   CREATININE 1.36* 1.20* 1.27* 1.32* 0.99 0.91  CALCIUM 9.1  --  8.9 8.4* 9.5 8.6*   GFR: Estimated Creatinine Clearance: 48.3 mL/min (by C-G formula based on SCr of 0.91 mg/dL). Liver Function Tests: Recent Labs  Lab 06/02/23 1104  AST 25  ALT 26  ALKPHOS 136*  BILITOT 0.9  PROT 7.2  ALBUMIN 3.0*   Recent Labs  Lab 06/02/23 1104  LIPASE 24   Recent Labs  Lab 06/02/23 1106  AMMONIA 27   Coagulation Profile: No results for input(s): "INR", "PROTIME" in the last 168 hours. Cardiac Enzymes: No results for input(s): "CKTOTAL", "CKMB", "CKMBINDEX", "TROPONINI" in the last 168 hours. BNP (last 3 results) No results for input(s): "PROBNP" in the last 8760 hours. HbA1C: No results for input(s): "HGBA1C" in the last 72 hours. CBG: Recent Labs  Lab 06/07/23 1148 06/07/23 1612 06/07/23 2031 06/08/23 0740 06/08/23 1147  GLUCAP 271* 170* 159* 227* 212*   Lipid Profile: No results for input(s): "CHOL", "HDL", "LDLCALC", "TRIG", "CHOLHDL", "LDLDIRECT" in the last 72 hours. Thyroid Function Tests: No results for input(s): "TSH", "T4TOTAL", "FREET4", "T3FREE", "THYROIDAB" in the last 72 hours.  Anemia Panel: No results for input(s): "VITAMINB12", "FOLATE", "FERRITIN", "TIBC", "IRON", "RETICCTPCT" in the last 72 hours. Sepsis Labs: Recent Labs  Lab 06/02/23 1112 06/02/23 1410 06/02/23 2227 06/03/23 0556  LATICACIDVEN 3.8* 3.1* 1.7 1.5    No  results found for this or any previous visit (from the past 240 hour(s)).       Radiology Studies: No results found.      Scheduled Meds:  dorzolamide-timolol  1 drop Both Eyes BID   insulin aspart  0-20 Units Subcutaneous TID WC   insulin aspart  0-5 Units Subcutaneous QHS   latanoprost  1 drop Both Eyes QHS   levothyroxine  75 mcg Oral Q0600   linagliptin  5 mg Oral Daily   metFORMIN  500 mg Oral BID WC   metoprolol tartrate  25 mg Oral BID   rivaroxaban  20 mg Oral Q supper   Continuous Infusions:  albumin human        LOS: 6 days    Time spent: 45 minutes spent on chart review, discussion with nursing staff, consultants, updating family and interview/physical exam; more  than 50% of that time was spent in counseling and/or coordination of care.    Joseph Art, DO [facility] Available via Epic secure chat 7am-7pm After these hours, please refer to coverage provider listed on [email or web address] 06/08/2023, 3:35 PM

## 2023-06-08 NOTE — Progress Notes (Signed)
Pt's Hgb on redraw was 5.8, attempted to notify Opyd, MD. Awaiting further orders to transfuse. Verbal consent from pt's son obtained to receive blood/blood products.   Bari Edward, RN

## 2023-06-08 NOTE — Progress Notes (Signed)
Pt's BP 88/53 MAP 65, notified Opyd, MD. Permission to hold evening dose of Metoprolol.   Bari Edward, RN

## 2023-06-08 NOTE — Progress Notes (Signed)
Hgb is 5.8. Plan to transfuse 2 units RBC.

## 2023-06-08 NOTE — Telephone Encounter (Signed)
Received faxed document Home Health Certificate (Order ID [id number]), to be filled out by provider. Patient requested to send it back via Fax. Document is located in providers tray at front office.Please advise

## 2023-06-08 NOTE — TOC Transition Note (Signed)
Transition of Care [facility]) - CM/SW Discharge Note   Patient Details  Name: Crystal Ashley MRN: [id number] Date of Birth: 11/11/1945  Transition of Care [facility]) CM/SW Contact:  Lawerance Sabal, RN Phone Number: 06/08/2023, 10:40 AM   Clinical Narrative:     Sherron Monday w patient's son who confirmed that patient is active w [facility]. She has orders for [facility] PT RN SLP and I have communicated that to the [facility] liaison and also to anticipate DC today (per son and progression report).  No DME needs.  Patient will need PTAR transport, address on file verified with son, Bess Harvest.  PTAR forms on chart will call after patient's DC order placed.   Final next level of care: Home w Home Health Services Barriers to Discharge: No Barriers Identified   Patient Goals and CMS Choice CMS Medicare.gov Compare Post Acute Care list provided to:: Other (Comment Required) Choice offered to / list presented to : Adult Children  Discharge Placement                         Discharge Plan and Services Additional resources added to the After Visit Summary for                  DME Arranged: N/A         HH Arranged: RN, PT, Speech Therapy HH Agency: [facility] Date [facility] Agency Contacted: 06/08/23 Time HH Agency Contacted: 1040 Representative spoke with at [facility] Agency: Angie  Social Determinants of Health (SDOH) Interventions SDOH Screenings   Food Insecurity: No Food Insecurity (06/03/2023)  Housing: Low Risk  (06/03/2023)  Transportation Needs: No Transportation Needs (06/03/2023)  Utilities: Not At Risk (06/03/2023)  Depression (PHQ2-9): High Risk (05/20/2023)  Financial Resource Strain: Low Risk  (06/26/2022)  Physical Activity: Inactive (06/26/2022)  Social Connections: Socially Isolated (06/26/2022)  Stress: No Stress Concern Present (06/26/2022)  Tobacco Use: Low Risk  (06/02/2023)     Readmission Risk Interventions     No data to display

## 2023-06-08 NOTE — Progress Notes (Signed)
Labs back and Hgb  shows a level of 6.  Dropped from 2 days ago.  No blood loss seen- check stool for blood, no abdominal pain.  Unclear why hgb had sudden drop.  Patient does have some swelling.  Will repeat h/h to confirm, get iron levels, check B12. Called son to update but no answer.  Will have cross coverage follow up. Marlin Canary

## 2023-06-09 ENCOUNTER — Other Ambulatory Visit: Payer: Self-pay | Admitting: *Deleted

## 2023-06-09 DIAGNOSIS — D649 Anemia, unspecified: Secondary | ICD-10-CM

## 2023-06-09 DIAGNOSIS — I482 Chronic atrial fibrillation, unspecified: Secondary | ICD-10-CM | POA: Diagnosis not present

## 2023-06-09 DIAGNOSIS — E1169 Type 2 diabetes mellitus with other specified complication: Secondary | ICD-10-CM

## 2023-06-09 DIAGNOSIS — E871 Hypo-osmolality and hyponatremia: Secondary | ICD-10-CM | POA: Diagnosis not present

## 2023-06-09 LAB — IRON AND TIBC
Iron: 117 ug/dL (ref 28–170)
Saturation Ratios: 58 % — ABNORMAL HIGH (ref 10.4–31.8)
TIBC: 200 ug/dL — ABNORMAL LOW (ref 250–450)
UIBC: 83 ug/dL

## 2023-06-09 LAB — TECHNOLOGIST SMEAR REVIEW

## 2023-06-09 LAB — GLUCOSE, CAPILLARY
Glucose-Capillary: 225 mg/dL — ABNORMAL HIGH (ref 70–99)
Glucose-Capillary: 242 mg/dL — ABNORMAL HIGH (ref 70–99)
Glucose-Capillary: 162 mg/dL — ABNORMAL HIGH (ref 70–99)
Glucose-Capillary: 249 mg/dL — ABNORMAL HIGH (ref 70–99)

## 2023-06-09 LAB — HEPATIC FUNCTION PANEL
ALT: 16 U/L (ref 0–44)
AST: 24 U/L (ref 15–41)
Albumin: 2.5 g/dL — ABNORMAL LOW (ref 3.5–5.0)
Alkaline Phosphatase: 72 U/L (ref 38–126)
Bilirubin, Direct: 0.3 mg/dL — ABNORMAL HIGH (ref 0.0–0.2)
Indirect Bilirubin: 1.3 mg/dL — ABNORMAL HIGH (ref 0.3–0.9)
Total Bilirubin: 1.6 mg/dL — ABNORMAL HIGH (ref 0.3–1.2)
Total Protein: 5.4 g/dL — ABNORMAL LOW (ref 6.5–8.1)

## 2023-06-09 LAB — BASIC METABOLIC PANEL
Anion gap: 10 (ref 5–15)
BUN: 14 mg/dL (ref 8–23)
CO2: 22 mmol/L (ref 22–32)
Calcium: 8.1 mg/dL — ABNORMAL LOW (ref 8.9–10.3)
Chloride: 101 mmol/L (ref 98–111)
Creatinine, Ser: 1.03 mg/dL — ABNORMAL HIGH (ref 0.44–1.00)
GFR, Estimated: 56 mL/min — ABNORMAL LOW (ref >60)
Glucose, Bld: 219 mg/dL — ABNORMAL HIGH (ref 70–99)
Potassium: 3.9 mmol/L (ref 3.5–5.1)
Sodium: 133 mmol/L — ABNORMAL LOW (ref 135–145)

## 2023-06-09 LAB — CBC
HCT: 30 % — ABNORMAL LOW (ref 36.0–46.0)
Hemoglobin: 10 g/dL — ABNORMAL LOW (ref 12.0–15.0)
MCH: 29.2 pg (ref 26.0–34.0)
MCHC: 33.3 g/dL (ref 30.0–36.0)
MCV: 87.5 fL (ref 80.0–100.0)
Platelets: 202 10*3/uL (ref 150–400)
RBC: 3.43 MIL/uL — ABNORMAL LOW (ref 3.87–5.11)
RDW: 21.5 % — ABNORMAL HIGH (ref 11.5–15.5)
WBC: 17 10*3/uL — ABNORMAL HIGH (ref 4.0–10.5)
nRBC: 2.3 % — ABNORMAL HIGH (ref 0.0–0.2)

## 2023-06-09 LAB — FERRITIN: Ferritin: 63 ng/mL (ref 11–307)

## 2023-06-09 MED ORDER — METOPROLOL TARTRATE 12.5 MG HALF TABLET
12.5000 mg | ORAL_TABLET | Freq: Two times a day (BID) | ORAL | Status: DC
  Administered 2023-06-10 – 2023-06-11 (×3): 12.5 mg via ORAL
  Filled 2023-06-09 (×4): qty 1

## 2023-06-09 MED ORDER — INSULIN GLARGINE-YFGN 100 UNIT/ML ~~LOC~~ SOLN
8.0000 [IU] | Freq: Every day | SUBCUTANEOUS | Status: DC
  Administered 2023-06-09 – 2023-06-11 (×3): 8 [IU] via SUBCUTANEOUS
  Filled 2023-06-09 (×3): qty 0.08

## 2023-06-09 NOTE — Plan of Care (Signed)

## 2023-06-09 NOTE — Progress Notes (Signed)
PROGRESS NOTE    Crystal Ashley  ZOX:[id number] DOB: [birth year] DOA: 06/02/2023 PCP: Ardith Dark, MD    Brief Narrative:  Crystal Ashley is a 77 y.o. 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  Pt unable to provide history as she was non-verbal which is her baseline. Son provides hx over the phone. He is her caretaker and she also has day time aid. Pt is mostly bed-bound and has not ambulate in a year and a half but can feed self. Today she was not helping with transfer. Son usually puts a belt around her and she helps push up on bed rails but today was just slumped over. She also was very diaphoretic. Did not cut up her breakfast as she usually does. Appeared weak. Only ate 2 bites and appear to want to vomit.  Found to have dysphagia and aspiration PNA.  Stay complicated by hypotension from meds./poor PO intake.     Assessment and Plan: Sepsis due to sacral decubitus cellulitis vs aspiration PNA -wounds healing-- dressings per WOC -She was changed over to oral antibiotics.  Looks like she has completed the course as she normally longer is on any antibiotics. Stable from an infectious disease standpoint.  WBC is improving. -wound care consulted: Prevalon boots to bilateral heels  Normocytic anemia/unspecified Significant drop in hemoglobin noted yesterday.  Hemoglobin dropped down to 5.8.  No evidence of overt bleeding.  She was transfused 2 units of PRBC.  Hemoglobin noted to be 10.0 this morning.  Yesterday's values could have been erroneous.  Discussed with nursing staff.  No bleeding noted from her sacral wounds.  No other bleeding identified. Will recheck hemoglobin tomorrow morning.  Check LFTs.  AKI (acute kidney injury) (HCC) Appears to have resolved.  Chronic atrial fibrillation with RVR (HCC) -change to PO meds-- one that can be  crushed-- adjust for better HR control-- did not tolerate >25mg  Xarelto currently on hold due to significant drop in hemoglobin yesterday.  If it remains stable tomorrow then it can be resumed at discharge.  Dysphagia -son reports difficulty with swallowing pills at home for several months -while receiving Lokelma solution in ED, pt appears to have aspirated and had oxygen desaturation down to mid-80s. Placed on oxygen supplementation briefly with improvement -obtain speech eval- DYS 1 nectar thick -per son, would NOT want feeding tube -nutrition consult to educate son on diet  Hypotension She was given IV fluids and albumin.  Improvement in blood pressure noted.  She is noted to be on metoprolol twice a day for atrial fibrillation.  Decrease the dose.  Acute metabolic encephalopathy -secondary to dehydration/aspiration? -appears to be back at baseline  Hyperkalemia -resolved  Hyponatremia -Stable  Uncontrolled type 2 diabetes mellitus with hyperglycemia, without long-term current use of insulin (HCC) -recent A1C of 9.5 and was restarted on metformin recently--increase dose- and follow up outpatient  -tradjenta >100$ due to donut hole  Hyperlipidemia associated with type 2 diabetes mellitus (HCC) -no longer on statin per cardiology as pt wanted to decrease medication burden  Hypothyroidism -Continue levothyroxine   DVT prophylaxis: On Xarelto which is currently on hold.  SCDs    Code Status: Limited: Do not attempt resuscitation (DNR) -DNR-LIMITED -Do Not Intubate/DNI  Family Communication: No family at bedside.  Will update son later today.  Disposition Plan:  Level of care: Telemetry Cardiac Status is: Inpatient Remains inpatient appropriate  Consultants:  SLP   Subjective: Patient is nonverbal as is her baseline.  Objective: Vitals:   06/09/23 0308 06/09/23 0407 06/09/23 0527 06/09/23 0738  BP: (!) 108/39 105/65 (!) 105/48 (!) 107/56  Pulse: 100 94 88 99   Resp: 20 (!) 23 20 20   Temp: 97.9 F (36.6 C) 98 F (36.7 C)  98.4 F (36.9 C)  TempSrc: Oral Oral Oral Oral  SpO2: 100% 98% 99% 98%  Weight:      Height:        Intake/Output Summary (Last 24 hours) at 06/09/2023 0937 Last data filed at 06/09/2023 0407 Gross per 24 hour  Intake 932 ml  Output 2200 ml  Net -1268 ml   Filed Weights   06/02/23 1200 06/03/23 1406  Weight: 82.6 kg 79.5 kg    Examination:  General appearance: Awake alert.  In no distress.  Not communicative Resp: Clear to auscultation bilaterally.  Normal effort Cardio: S1-S2 is normal regular.  No S3-S4.  No rubs murmurs or bruit GI: Abdomen is soft.  Nontender nondistended.  Bowel sounds are present normal.  No masses organomegaly Extremities: Edema bilateral lower extremities. Does not follow commands.    Data Reviewed: I have personally reviewed following labs and imaging studies  CBC: Recent Labs  Lab 06/05/23 0523 06/06/23 0444 06/08/23 1734 06/08/23 2028 06/09/23 0617  WBC 13.8* 11.2* 23.9* 21.6* 17.0*  HGB 9.1* 9.0* 6.0* 5.8* 10.0*  HCT 28.6* 28.7* 19.5* 17.7* 30.0*  MCV 95.7 95.7 99.0 97.8 87.5  PLT 268 273 309 262 202   Basic Metabolic Panel: Recent Labs  Lab 06/04/23 0613 06/05/23 0523 06/06/23 0444 06/08/23 1503 06/09/23 0617  NA 135 139 136 134* 133*  K 3.8 3.5 3.7 4.2 3.9  CL 98 97* 100 102 101  CO2 25 25 25  21* 22  GLUCOSE 160* 114* 167* 158* 219*  BUN 20 17 10 17 14   CREATININE 1.32* 0.99 0.91 1.14* 1.03*  CALCIUM 8.4* 9.5 8.6* 8.2* 8.1*   GFR: Estimated Creatinine Clearance: 42.7 mL/min (A) (by C-G formula based on SCr of 1.03 mg/dL (H)).  Liver Function Tests: Recent Labs  Lab 06/02/23 1104  AST 25  ALT 26  ALKPHOS 136*  BILITOT 0.9  PROT 7.2  ALBUMIN 3.0*   Recent Labs  Lab 06/02/23 1104  LIPASE 24   Recent Labs  Lab 06/02/23 1106  AMMONIA 27    CBG: Recent Labs  Lab 06/08/23 0740 06/08/23 1147 06/08/23 1603 06/08/23 2101 06/09/23 0737   GLUCAP 227* 212* 141* 234* 225*    Anemia Panel: Recent Labs    06/08/23 1842 06/09/23 0616  VITAMINB12 3,168*  --   FERRITIN  --  63  TIBC  --  200*  IRON  --  117   Sepsis Labs: Recent Labs  Lab 06/02/23 1112 06/02/23 1410 06/02/23 2227 06/03/23 0556  LATICACIDVEN 3.8* 3.1* 1.7 1.5    Radiology Studies: No results found.   Scheduled Meds:  dorzolamide-timolol  1 drop Both Eyes BID   insulin aspart  0-20 Units Subcutaneous TID WC   insulin aspart  0-5 Units Subcutaneous QHS   latanoprost  1 drop Both Eyes QHS   levothyroxine  75 mcg Oral Q0600   linagliptin  5 mg Oral Daily   metFORMIN  500 mg Oral BID WC   metoprolol tartrate  25 mg Oral BID   sodium chloride flush  10-40 mL Intracatheter Q12H   Continuous Infusions:      LOS: 7 days  Osvaldo Shipper,  [facility] Available via Epic secure chat 7am-7pm After these hours, please refer to coverage provider listed on [email or web address] 06/09/2023, 9:37 AM

## 2023-06-09 NOTE — Care Management Important Message (Signed)
Important Message  Patient Details  Name: Crystal Ashley MRN: [id number] Date of Birth: April 18, 1946   Important Message Given:  Yes - Medicare IM     Sherilyn Banker 06/09/2023, 3:06 PM

## 2023-06-09 NOTE — Progress Notes (Signed)
Physical Therapy Treatment Patient Details Name: Crystal Ashley MRN: [id number] DOB: 1945-10-17 Today's Date: 06/09/2023   History of Present Illness 77 y.o. female presents to [facility] 06/02/23 with altered mental status. Admitted w/ sepsis 2/2 sacral decubitus cellulitis vs aspiration PNA. 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 Pt continues to be totalAx2 for all bed mobility. Pt with heavy posterior lean upon sitting EOB requiring maxA posteriorly. Pt was able to follow command to squeeze hand, however, pt did not initiate with any mobility. Pt is slowly progressing towards goals. Acute PT to follow.      If plan is discharge home, recommend the following: Two people to help with walking and/or transfers;Assist for transportation;Assistance with cooking/housework;Help with stairs or ramp for entrance   Can travel by private vehicle      No  Equipment Recommendations  None recommended by PT       Precautions / Restrictions Precautions Precautions: Fall Restrictions Weight Bearing Restrictions: No     Mobility  Bed Mobility Overal bed mobility: Needs Assistance Bed Mobility: Supine to Sit, Sit to Supine     Supine to sit: +2 for physical assistance Sit to supine: +2 for physical assistance   General bed mobility comments: TotalAx2 for bed mobility, pt did not initiate any movement with transfer.    Transfers  General transfer comment: Unable at this time    Ambulation/Gait  General Gait Details: pt is non-ambulatory at baseline.          Balance Overall balance assessment: Needs assistance Sitting-balance support: Feet supported, No upper extremity supported, Bilateral upper extremity supported Sitting balance-Leahy Scale: Zero Sitting balance - Comments: required posterior support, MaxA         Cognition Arousal: Alert Behavior  During Therapy: Flat affect Overall Cognitive Status: History of cognitive impairments - at baseline      General Comments: Non verbal with hx of dementia. Followed command to squeeze hand, not initiating movements.        Exercises General Exercises - Lower Extremity Ankle Circles/Pumps: PROM, Both, 5 reps, Seated Long Arc Quad: PROM, Both, 5 reps, Seated    General Comments General comments (skin integrity, edema, etc.): VSS on RA      Pertinent Vitals/Pain Pain Assessment Pain Assessment: PAINAD Breathing: normal Negative Vocalization: none Facial Expression: smiling or inexpressive Body Language: relaxed Consolability: no need to console PAINAD Score: 0     PT Goals (current goals can now be found in the care plan section) Progress towards PT goals: Not progressing toward goals - comment    Frequency    Min 1X/week       AM-PAC PT "6 Clicks" Mobility   Outcome Measure  Help needed turning from your back to your side while in a flat bed without using bedrails?: Total Help needed moving from lying on your back to sitting on the side of a flat bed without using bedrails?: Total Help needed moving to and from a bed to a chair (including a wheelchair)?: Total Help needed standing up from a chair using your arms (e.g., wheelchair or bedside chair)?: Total Help needed to walk in hospital room?: Total Help needed climbing 3-5 steps with a railing? : Total 6 Click Score: 6    End of Session   Activity Tolerance: Patient limited by fatigue Patient left: in bed;with call bell/phone within reach;with bed  alarm set Nurse Communication: Mobility status PT Visit Diagnosis: Other abnormalities of gait and mobility (R26.89)     Time: 4742-5956 PT Time Calculation (min) (ACUTE ONLY): 19 min  Charges:    $Therapeutic Activity: 8-22 mins PT General Charges $$ ACUTE PT VISIT: 1 Visit                     Hilton Cork, PT, DPT Secure Chat Preferred  Rehab Office  [phone]    Arturo Morton Brion Aliment 06/09/2023, 5:02 PM

## 2023-06-09 NOTE — Progress Notes (Signed)
Speech Language Pathology Treatment: Dysphagia  Patient Details Name: Crystal Ashley MRN: [id number] DOB: 12/04/1945 Today's Date: 06/09/2023 Time: 4098-1191 SLP Time Calculation (min) (ACUTE ONLY): 18 min  Assessment / Plan / Recommendation Clinical Impression  Pt's son was at the bedside. Ms. Getchell continues to demonstrate intermittent coughing and oral holding of POs despite efforts to modify diet/thicken liquids. She likely continues to aspirate intermittently.  Her son, Crystal Ashley, and I discussed the trajectory of dementia and its ultimate impact upon swallowing and eating, including overall reduced intake, decreased desire to eat/drink, deteriorating communication between the brain and the muscles that are needed for safe swallowing, increased likelihood of aspiration. We discussed the benefits of a Palliative Care consult in moving forward.  Crystal Ashley states his mother made her wishes clear re: EOL after experiencing the hospitalization and death of her husband in the winter of 2020.  He is open to a conversation with their team - requested consult per Dr. Rito Ehrlich.    HPI HPI: Crystal Ashley is a 77 y.o. female who presented with altered mental status. CXR and Head CT 10/16 without acute findings. Per chart review pt is mostly bedbound, but was feeding herself and cutting up her own food pta. Pt with MBS in Oct of 2021 with concern for esophageal dysphagia and reflux to pharynx; recs for reg diet with f/u for esophageal issues as needed. 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      SLP Plan  Continue with current plan of care;Consult other service (comment) (Palliative Care)      Recommendations for follow up therapy are one component of a multi-disciplinary discharge planning process, led by the attending physician.  Recommendations may be updated based on patient status, additional  functional criteria and insurance authorization.    Recommendations  Diet recommendations: Dysphagia 1 (puree);Nectar-thick liquid Liquids provided via: Cup;Straw Medication Administration: Crushed with puree Supervision: Staff to assist with self feeding;Full supervision/cueing for compensatory strategies;Trained caregiver to feed patient Compensations: Minimize environmental distractions;Slow rate;Small sips/bites Postural Changes and/or Swallow Maneuvers: Seated upright 90 degrees;Upright 30-60 min after meal                  Oral care BID   Frequent or constant Supervision/Assistance Dysphagia, oropharyngeal phase (R13.12)     Continue with current plan of care;Consult other service (comment) (Palliative Care)  Zenab Gronewold L. Samson Frederic, MA CCC/SLP Clinical Specialist - Acute Care SLP Acute Rehabilitation Services Office number [phone]    Blenda Mounts Laurice  06/09/2023, 11:41 AM

## 2023-06-09 NOTE — Telephone Encounter (Signed)
Placed to be reviewed in PCP desk

## 2023-06-10 DIAGNOSIS — D649 Anemia, unspecified: Secondary | ICD-10-CM | POA: Diagnosis not present

## 2023-06-10 DIAGNOSIS — E871 Hypo-osmolality and hyponatremia: Secondary | ICD-10-CM | POA: Diagnosis not present

## 2023-06-10 DIAGNOSIS — Z7189 Other specified counseling: Secondary | ICD-10-CM

## 2023-06-10 DIAGNOSIS — F03C0 Unspecified dementia, severe, without behavioral disturbance, psychotic disturbance, mood disturbance, and anxiety: Secondary | ICD-10-CM

## 2023-06-10 DIAGNOSIS — Z66 Do not resuscitate: Secondary | ICD-10-CM

## 2023-06-10 DIAGNOSIS — I482 Chronic atrial fibrillation, unspecified: Secondary | ICD-10-CM | POA: Diagnosis not present

## 2023-06-10 DIAGNOSIS — A419 Sepsis, unspecified organism: Secondary | ICD-10-CM | POA: Diagnosis not present

## 2023-06-10 DIAGNOSIS — R1310 Dysphagia, unspecified: Secondary | ICD-10-CM

## 2023-06-10 DIAGNOSIS — Z515 Encounter for palliative care: Secondary | ICD-10-CM

## 2023-06-10 LAB — CBC
HCT: 29.3 % — ABNORMAL LOW (ref 36.0–46.0)
Hemoglobin: 9.8 g/dL — ABNORMAL LOW (ref 12.0–15.0)
MCH: 29.5 pg (ref 26.0–34.0)
MCHC: 33.4 g/dL (ref 30.0–36.0)
MCV: 88.3 fL (ref 80.0–100.0)
Platelets: 222 10*3/uL (ref 150–400)
RBC: 3.32 MIL/uL — ABNORMAL LOW (ref 3.87–5.11)
RDW: 21.9 % — ABNORMAL HIGH (ref 11.5–15.5)
WBC: 15.5 10*3/uL — ABNORMAL HIGH (ref 4.0–10.5)
nRBC: 1.4 % — ABNORMAL HIGH (ref 0.0–0.2)

## 2023-06-10 LAB — BPAM RBC
Blood Product Expiration Date: 202411172359
Blood Product Expiration Date: 202411142359
ISSUE DATE / TIME: 202410222213
ISSUE DATE / TIME: 202410230125
Unit Type and Rh: 5100
Unit Type and Rh: 5100

## 2023-06-10 LAB — TYPE AND SCREEN
ABO/RH(D): O POS
Antibody Screen: NEGATIVE
Unit division: 0
Unit division: 0

## 2023-06-10 LAB — COMPREHENSIVE METABOLIC PANEL
ALT: 56 U/L — ABNORMAL HIGH (ref 0–44)
AST: 90 U/L — ABNORMAL HIGH (ref 15–41)
Albumin: 2.2 g/dL — ABNORMAL LOW (ref 3.5–5.0)
Alkaline Phosphatase: 217 U/L — ABNORMAL HIGH (ref 38–126)
Anion gap: 8 (ref 5–15)
BUN: 13 mg/dL (ref 8–23)
CO2: 22 mmol/L (ref 22–32)
Calcium: 8.2 mg/dL — ABNORMAL LOW (ref 8.9–10.3)
Chloride: 104 mmol/L (ref 98–111)
Creatinine, Ser: 0.9 mg/dL (ref 0.44–1.00)
GFR, Estimated: >60 mL/min (ref >60)
Glucose, Bld: 191 mg/dL — ABNORMAL HIGH (ref 70–99)
Potassium: 3.7 mmol/L (ref 3.5–5.1)
Sodium: 134 mmol/L — ABNORMAL LOW (ref 135–145)
Total Bilirubin: 2.4 mg/dL — ABNORMAL HIGH (ref 0.3–1.2)
Total Protein: 5.1 g/dL — ABNORMAL LOW (ref 6.5–8.1)

## 2023-06-10 LAB — GLUCOSE, CAPILLARY
Glucose-Capillary: 166 mg/dL — ABNORMAL HIGH (ref 70–99)
Glucose-Capillary: 175 mg/dL — ABNORMAL HIGH (ref 70–99)
Glucose-Capillary: 166 mg/dL — ABNORMAL HIGH (ref 70–99)
Glucose-Capillary: 191 mg/dL — ABNORMAL HIGH (ref 70–99)

## 2023-06-10 MED ORDER — ADULT MULTIVITAMIN W/MINERALS CH
1.0000 | ORAL_TABLET | Freq: Every day | ORAL | Status: DC
  Administered 2023-06-10 – 2023-06-11 (×2): 1 via ORAL
  Filled 2023-06-10 (×2): qty 1

## 2023-06-10 MED ORDER — GLUCERNA SHAKE PO LIQD
237.0000 mL | Freq: Two times a day (BID) | ORAL | Status: DC
  Administered 2023-06-11 (×2): 237 mL via ORAL

## 2023-06-10 NOTE — Progress Notes (Signed)
PROGRESS NOTE    Crystal Ashley  FGH:[id number] DOB: [birth year] DOA: 06/02/2023 PCP: Ardith Dark, MD    Brief Narrative:  Crystal Ashley is a 77 y.o. 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  Pt unable to provide history as she was non-verbal which is her baseline. Son provides hx over the phone. He is her caretaker and she also has day time aid. Pt is mostly bed-bound and has not ambulate in a year and a half but can feed self. Today she was not helping with transfer. Son usually puts a belt around her and she helps push up on bed rails but today was just slumped over. She also was very diaphoretic. Did not cut up her breakfast as she usually does. Appeared weak. Only ate 2 bites and appear to want to vomit.  Found to have dysphagia and aspiration PNA.  Stay complicated by hypotension from meds./poor PO intake.     Assessment and Plan:  Sepsis due to sacral decubitus cellulitis vs aspiration PNA -wounds healing-- dressings per WOC -She was changed over to oral antibiotics.  Looks like she has completed the course as she no longer is on any antibiotics. Stable from an infectious disease standpoint.  WBC has improved.  She remains afebrile.  -wound care consulted: Prevalon boots to bilateral heels  Normocytic anemia/unspecified Significant drop in hemoglobin noted on 10/22 when her hemoglobin dropped down to 5.8.  No overt bleeding was noted.  Could have been erroneous reading since her hemoglobin increased to more than expected after 2 units of PRBC.  Stable this morning.  Do not anticipate any further workup at this time.  Abnormal LFTs likely due to blood transfusion.  Do not suspect hemolysis.  AKI (acute kidney injury) (HCC) Appears to have resolved.  Chronic atrial fibrillation with RVR (HCC) Noted to be on metoprolol.  Anticoagulation  currently on hold due to anemia.  Can be resumed.  But we will first wait for goals of care conversation with palliative care before reintroducing medications.  Dysphagia -son reports difficulty with swallowing pills at home for several months -while receiving Lokelma solution in ED, pt appears to have aspirated and had oxygen desaturation down to mid-80s. Placed on oxygen supplementation briefly with improvement. By speech therapy.  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  Palliative care consulted for goals of care conversation.  Son would not want any feeding tubes.  Hypotension She was given IV fluids and albumin.  Improvement in blood pressure noted.  She is noted to be on metoprolol twice a day for atrial fibrillation.  Dose was decreased.  Blood pressure has stabilized.  Continue to monitor.  Acute metabolic encephalopathy Seems to be close to baseline.  Hyperkalemia -resolved  Hyponatremia -Stable  Uncontrolled type 2 diabetes mellitus with hyperglycemia, without long-term current use of insulin (HCC) -recent A1C of 9.5. Noted to be on metformin.  Dose was increased recently.  Was started also on linagliptin.  CBGs remain poorly controlled.  Started on glargine yesterday.  Hyperlipidemia associated with type 2 diabetes mellitus (HCC) -no longer on statin per cardiology as pt wanted to decrease medication burden  Hypothyroidism -Continue levothyroxine   DVT prophylaxis: On Xarelto which is currently on hold.  SCDs Code Status: DNR/DNI Family Communication: No family at bedside.  Son was updated yesterday. Disposition Plan: To be determined    Consultants:  SLP  Subjective: Patient is nonverbal as is her baseline.  She does smile at me.  Objective: Vitals:   06/10/23 0008 06/10/23 0009 06/10/23 0435 06/10/23 0728  BP:  109/65 107/69 103/60  Pulse:  100 (!) 107 (!) 103  Resp:  19 18 16   Temp: 98.6 F (37 C) 98.6 F (37 C) 98.7 F (37.1 C) 98.2 F (36.8  C)  TempSrc:  Oral Oral Oral  SpO2:  98% 98% 100%  Weight:      Height:        Intake/Output Summary (Last 24 hours) at 06/10/2023 1000 Last data filed at 06/09/2023 2100 Gross per 24 hour  Intake 236 ml  Output 150 ml  Net 86 ml   Filed Weights   06/02/23 1200 06/03/23 1406  Weight: 82.6 kg 79.5 kg    Examination:  General appearance: Awake alert.  In no distress Resp: Clear to auscultation bilaterally.  Normal effort Cardio: S1-S2 is normal regular.  No S3-S4.  No rubs murmurs or bruit GI: Abdomen is soft.  Nontender nondistended.  Bowel sounds are present normal.  No masses organomegaly     Data Reviewed: I have personally reviewed following labs and imaging studies  CBC: Recent Labs  Lab 06/06/23 0444 06/08/23 1734 06/08/23 2028 06/09/23 0617 06/10/23 0518  WBC 11.2* 23.9* 21.6* 17.0* 15.5*  HGB 9.0* 6.0* 5.8* 10.0* 9.8*  HCT 28.7* 19.5* 17.7* 30.0* 29.3*  MCV 95.7 99.0 97.8 87.5 88.3  PLT 273 309 262 202 222   Basic Metabolic Panel: Recent Labs  Lab 06/05/23 0523 06/06/23 0444 06/08/23 1503 06/09/23 0617 06/10/23 0518  NA 139 136 134* 133* 134*  K 3.5 3.7 4.2 3.9 3.7  CL 97* 100 102 101 104  CO2 25 25 21* 22 22  GLUCOSE 114* 167* 158* 219* 191*  BUN 17 10 17 14 13   CREATININE 0.99 0.91 1.14* 1.03* 0.90  CALCIUM 9.5 8.6* 8.2* 8.1* 8.2*   GFR: Estimated Creatinine Clearance: 48.8 mL/min (by C-G formula based on SCr of 0.9 mg/dL).  Liver Function Tests: Recent Labs  Lab 06/09/23 0617 06/10/23 0518  AST 24 90*  ALT 16 56*  ALKPHOS 72 217*  BILITOT 1.6* 2.4*  PROT 5.4* 5.1*  ALBUMIN 2.5* 2.2*    CBG: Recent Labs  Lab 06/09/23 0737 06/09/23 1139 06/09/23 1542 06/09/23 2105 06/10/23 0728  GLUCAP 225* 242* 162* 249* 166*    Anemia Panel: Recent Labs    06/08/23 1842 06/09/23 0616  VITAMINB12 3,168*  --   FERRITIN  --  63  TIBC  --  200*  IRON  --  117    Radiology Studies: No results found.   Scheduled Meds:   dorzolamide-timolol  1 drop Both Eyes BID   insulin aspart  0-20 Units Subcutaneous TID WC   insulin aspart  0-5 Units Subcutaneous QHS   insulin glargine-yfgn  8 Units Subcutaneous Daily   latanoprost  1 drop Both Eyes QHS   levothyroxine  75 mcg Oral Q0600   linagliptin  5 mg Oral Daily   metFORMIN  500 mg Oral BID WC   metoprolol tartrate  12.5 mg Oral BID   sodium chloride flush  10-40 mL Intracatheter Q12H   Continuous Infusions:    LOS: 8 days   Osvaldo Shipper,  [facility] Available via Epic secure chat 7am-7pm After these hours, please refer to coverage provider listed on [email or web address] 06/10/2023, 10:00 AM

## 2023-06-10 NOTE — Telephone Encounter (Signed)
Form faxed to [phone]  Placed to be scan in patient chart

## 2023-06-10 NOTE — TOC Progression Note (Signed)
Transition of Care [facility]) - Progression Note    Patient Details  Name: Crystal Ashley MRN: [id number] Date of Birth: [birth year]  [facility] CM/SW Contact  Huston Foley Jacklynn Ganong, RN Phone Number: 06/10/2023, 4:03 PM  Clinical Narrative:    Case Manager spoke with patient's son regarding his request for hospice services. Crystal Ashley states he is interested in continuing HHRN/PT and speech with [facility] and have Palliative Care following as well. Discussed agencies for service, CM contacted Misty with [facility] and reqwuested that she speak with him to discuss Palliative care. .   Expected Discharge Plan: Home w Home Health Services Barriers to Discharge: No Barriers Identified  Expected Discharge Plan and Services   Discharge Planning Services: CM Consult Post Acute Care Choice: Resumption of Svcs/PTA Provider                   DME Arranged: N/A         HH Arranged: RN, PT, Speech Therapy HH Agency: [facility] ([facility]- Palliative Care) Date HH Agency Contacted: 06/08/23 Time HH Agency Contacted: 1040 Representative spoke with at [facility] Agency: Misty with [employer]   Social Determinants of Health (SDOH) Interventions SDOH Screenings   Food Insecurity: No Food Insecurity (06/03/2023)  Housing: Low Risk  (06/03/2023)  Transportation Needs: No Transportation Needs (06/03/2023)  Utilities: Not At Risk (06/03/2023)  Depression (PHQ2-9): High Risk (05/20/2023)  Financial Resource Strain: Low Risk  (06/26/2022)  Physical Activity: Inactive (06/26/2022)  Social Connections: Socially Isolated (06/26/2022)  Stress: No Stress Concern Present (06/26/2022)  Tobacco Use: Low Risk  (06/02/2023)    Readmission Risk Interventions     No data to display

## 2023-06-10 NOTE — Consult Note (Signed)
Consultation Note Date: 06/10/2023   Patient Name: Crystal Ashley  DOB: 03-May-1946  MRN: [id number]  Age / Sex: 77 y.o., female  PCP: Ardith Dark, MD Referring Physician: Osvaldo Shipper, MD  Reason for Consultation: Establishing goals of care  HPI/Patient Profile: 77 y.o. 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 Found to have dysphagia and aspiration PNA. Stay complicated by hypotension from meds./poor PO intake.  PMT consulted to discuss goals of care  Clinical Assessment and Goals of Care: I have reviewed medical records including EPIC notes, labs and imaging, received report from RN, assessed the patient and then met with patient's son Crystal Ashley  to discuss diagnosis prognosis, GOC, EOL wishes, disposition and options.  I introduced Palliative Medicine as specialized medical care for people living with serious illness. It focuses on providing relief from the symptoms and stress of a serious illness. The goal is to improve quality of life for both the patient and the family.  He tells me she is bedbound but able to feed herself. Mostly nonverbal.    We discussed patient's current illness and what it means in the larger context of patient's on-going co-morbidities.  Natural disease trajectory and expectations at EOL were discussed. We discussed dementia trajectory. Discussed anticipated ongoing issues with dysphagia, poor intake.   I attempted to elicit values and goals of care important to the patient.  He would to like to be able to continue to care for the patient at home.   The difference between aggressive medical intervention and comfort care was considered in light of the patient's goals of care. Patient's son is interested in shifting focus of her care more to comfort. Not sure that he would want repeat  hospitalizations, would like to keep her at home and focus on comfort.   He states patient has been clear she would never want feeding tube.   Discussed with son the importance of continued conversation with family and the medical providers regarding overall plan of care and treatment options, ensuring decisions are within the context of the patient's values and GOCs.    Hospice  services outpatient were explained and offered. Discussed philosophy of hospice care and type of support provided. Son is interested in proceeding with hospice support.   Questions and concerns were addressed. The family was encouraged to call with questions or concerns.  Primary Decision Maker NEXT OF KIN - son Prashant    SUMMARY OF RECOMMENDATIONS   - dementia disease process discussed, son interested in proceeding with hospice support at home  Code Status/Advance Care Planning: DNR      Primary Diagnoses: Present on Admission:  Sepsis due to cellulitis (HCC)  AKI (acute kidney injury) (HCC)  Hypertension associated with diabetes (HCC)  Hypothyroidism  Hyperlipidemia associated with type 2 diabetes mellitus (HCC)   I have reviewed the medical record, interviewed the patient and family, and examined the patient. The following aspects are pertinent.  Past Medical History:  Diagnosis Date   Atrial fibrillation (HCC)    Cancer (HCC)    Cataract    Diabetes mellitus without complication (HCC)    Fracture 2001   left ankle   Glaucoma    Hypertension    Thyroid disease    Social History   Socioeconomic History   Marital status: Widowed    Spouse name: Not on file   Number of children: 1   Years  of education: Not on file   Highest education level: Not on file  Occupational History   Not on file  Tobacco Use   Smoking status: Never   Smokeless tobacco: Never  Vaping Use   Vaping status: Never Used  Substance and Sexual Activity   Alcohol use: No   Drug use: Never   Sexual activity:  Not on file  Other Topics Concern   Not on file  Social History Narrative   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, international aid   Only one child   Social Determinants of Health   Financial Resource Strain: Low Risk  (06/26/2022)   Overall Financial Resource Strain (CARDIA)    Difficulty of Paying Living Expenses: Not hard at all  Food Insecurity: No Food Insecurity (06/03/2023)   Hunger Vital Sign    Worried About Running Out of Food in the Last Year: Never true    Ran Out of Food in the Last Year: Never true  Transportation Needs: No Transportation Needs (06/03/2023)   PRAPARE - Administrator, Civil Service (Medical): No    Lack of Transportation (Non-Medical): No  Physical Activity: Inactive (06/26/2022)   Exercise Vital Sign    Days of Exercise per Week: 0 days    Minutes of Exercise per Session: 0 min  Stress: No Stress Concern Present (06/26/2022)   [employer] of Occupational Health - Occupational Stress Questionnaire    Feeling of Stress : Not at all  Social Connections: Socially Isolated (06/26/2022)   Social Connection and Isolation Panel [NHANES]    Frequency of Communication with Friends and Family: Never    Frequency of Social Gatherings with Friends and Family: Once a week    Attends Religious Services: Never    Database administrator or Organizations: No    Attends Banker Meetings: Never    Marital Status: Widowed   Family History  Problem Relation Age of Onset   Diabetes Son    Healthy Mother    Healthy Father    Scheduled Meds:  dorzolamide-timolol  1 drop Both Eyes BID   insulin aspart  0-20 Units Subcutaneous TID WC   insulin aspart  0-5 Units Subcutaneous QHS   insulin glargine-yfgn  8 Units Subcutaneous Daily   latanoprost  1 drop Both Eyes QHS   levothyroxine  75 mcg Oral Q0600   linagliptin  5 mg Oral Daily   metFORMIN  500 mg Oral BID WC   metoprolol tartrate  12.5 mg Oral BID   sodium chloride flush  10-40 mL  Intracatheter Q12H   Continuous Infusions: PRN Meds:.food thickener, sodium chloride flush Allergies  Allergen Reactions   Beef-Derived Products    Meat [Alpha-Gal] Other (See Comments)    Pt preference- No meat (beef/chicken/pork/turkey/etc.) with the exception of seafood.   Pork-Derived Products    Amiodarone     Nausea   Review of Systems  Unable to perform ROS: Dementia    Physical Exam Constitutional:      General: She is not in acute distress. Pulmonary:     Effort: Pulmonary effort is normal.  Skin:    General: Skin is warm and dry.  Neurological:     Mental Status: She is alert.     Comments: nonverbal     Vital Signs: BP 133/64 (BP Location: Left Arm)   Pulse (!) 103   Temp 98.7 F (37.1 C) (Oral)   Resp 16   Ht 5' (1.524 m)   Wt 79.5 kg  SpO2 100%   BMI 34.23 kg/m  Pain Scale: PAINAD       SpO2: SpO2: 100 % O2 Device:SpO2: 100 % O2 Flow Rate: .O2 Flow Rate (L/min): (S) 2 L/min  IO: Intake/output summary:  Intake/Output Summary (Last 24 hours) at 06/10/2023 1411 Last data filed at 06/10/2023 1016 Gross per 24 hour  Intake 85 ml  Output 250 ml  Net -165 ml    LBM: Last BM Date : 06/09/23 Baseline Weight: Weight: 82.6 kg Most recent weight: Weight: 79.5 kg     Palliative Assessment/Data: PPS 30%     *Please note that this is a verbal dictation therefore any spelling or grammatical errors are due to the "Dragon Medical One" system interpretation.   Time Total: 60 minutes Time spent includes: Detailed review of medical records (labs, imaging, vital signs), medically appropriate exam, discussion with treatment team, counseling and educating patient, family and/or staff, documenting clinical information, medication management and coordination of care.    Gerlean Ren, DNP, AGNP-C Palliative Medicine Team [phone] Pager: [phone]

## 2023-06-10 NOTE — Progress Notes (Signed)
Initial Nutrition Assessment  DOCUMENTATION CODES:   Not applicable  INTERVENTION:  Provided Dysphagia 1, pureed diet education and recommended high calorie food options to add to purees to increase calories and protein to promote wound healing.  Start MVI with minerals daily.  3.  Start Glucerna BID  NUTRITION DIAGNOSIS:   Inadequate oral intake related to dysphagia as evidenced by per patient/family report.  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 Admitted 06/02/23 due to AMS. Presents with sepsis due to sacral decubitus cellulitis and dysphagia.  RD consulted for nutrition education regarding diet instructions for Dysphagia 1 diet.  Patient non-verbal, no family present at time of visit. Talked to nsg and they report good po intake. Nsg reports patient has nausea while eating and has to eat very slowly. She ate 100% of her breakfast this morning. Spoke to son over the phone and states she has been bed bound for 2 years. Son reports PTA patient had poor po intake and only eats 2 meals/day  However, no weight loss has been noticed by son. Per son, Patient will eat 100% of her breakfast and only 50% for lunch. Patient does not eat meat. Son reports trying Ensure shakes 1 year ago but patient did not like them, but is willing to try them again if thickened.   Suspect malnutrition due to decreased energy intake but insufficient findings regarding weight history and NFPE due to edema.  BF: pancakes w/syrup  L: Bread, vegetables w/spices, and yogurt   RD provided "Dysphagia 1 , pureed diet " handout from the Academy of Nutrition and Dietetics. Reviewed patient's dietary recall. Provided examples on ways to modify foods on pureed diet. Encouraged pt to  blend foods using a blender or food processor and strain to obtain desired consistency (no lumps). Also encouraged use of commercial pureed foods and oral nutritional supplements such as nectar thick Ensure to provide adequate calories and protein. Discussed how to modify recipes to add calories and protein.    RD discussed why it is important for patient to adhere to diet recommendations and discussed possibility of diet advancement upon MD follow-up.   Expect good compliance.  Admit weight: 82.6 kg Current weight: 79.5   06/03/23 79.5 kg  06/26/22 70.3 kg  03/18/22 70.6 kg  11/14/21 76.2 kg  11/07/21 76.5 kg  06/02/21 77.6 kg  05/21/21 78.5 kg  05/12/21 78.5 kg  08/06/20 84.4 kg  07/25/20 84.4 kg   Last Weight  Most recent update: 06/03/2023  2:16 PM    Weight  79.5 kg (175 lb 4.3 oz)             Average Meal Intake: 10/18-10/24: 90% intake x 8 recorded meals  Nutritionally Relevant Medications: Scheduled Meds:  dorzolamide-timolol  1 drop Both Eyes BID   insulin aspart  0-20 Units Subcutaneous TID WC   insulin aspart  0-5 Units Subcutaneous QHS   insulin glargine-yfgn  8 Units Subcutaneous Daily   latanoprost  1 drop Both Eyes QHS   levothyroxine  75 mcg Oral Q0600   linagliptin  5 mg Oral Daily   metFORMIN  500 mg Oral BID WC   metoprolol tartrate  12.5 mg Oral BID   multivitamin with minerals  1 tablet Oral Daily  sodium chloride flush  10-40 mL Intracatheter Q12H   Labs Reviewed: Sodium: 134 mg/dL Calcium 8.2 mg/dL Alkaline phosphatase: 284 U/L AST: 90 U/L ALT: 56 U/L CBG ranges from 166-225 mg/dL over the last 24 hours HgbA1c 9.5(05/20/23)  NUTRITION - FOCUSED PHYSICAL EXAM:  Flowsheet Row Most Recent Value  Orbital Region Unable to assess  [Fluid]  Upper Arm Region Unable to assess  [Fluid]  Thoracic and Lumbar Region No depletion  Buccal Region Unable to assess  [fluid]  Temple Region Mild depletion  Clavicle Bone Region No depletion  Clavicle  and Acromion Bone Region No depletion  Scapular Bone Region No depletion  Dorsal Hand Unable to assess  [fluid]  Patellar Region Unable to assess  [fluid]  Anterior Thigh Region Unable to assess  [fluid]  Posterior Calf Region Unable to assess  [fluid]  Edema (RD Assessment) Severe  Hair Reviewed  Eyes Reviewed  Mouth Unable to assess  Skin Reviewed  Nails Reviewed       Diet Order:   Diet Order             DIET - DYS 1 Room service appropriate? No; Fluid consistency: Nectar Thick  Diet effective now                   EDUCATION NEEDS:   Education needs have been addressed  Skin:  Skin Assessment: Skin Integrity Issues: Skin Integrity Issues:: Other (Comment) Other: Presssure injury on buttocks, not staged. RUE +1, LUE +1, RLE +2, LLE +2 Edema  Last BM:  06/09/23  Height:   Ht Readings from Last 1 Encounters:  06/03/23 5' (1.524 m)    Weight:   Wt Readings from Last 1 Encounters:  06/03/23 79.5 kg    Ideal Body Weight:  45.05 kg  BMI:  Body mass index is 34.23 kg/m.  Estimated Nutritional Needs:   Kcal:  IBW x 30-35 kcal: 1350-1575 kcal  Protein:  1.5-2 g/kg: 67-90 g/kg  Fluid:  1350-1575 ml  Elliot Dally, RD Registered Dietitian  See Amion for more information

## 2023-06-10 NOTE — Progress Notes (Signed)
Occupational Therapy Treatment Patient Details Name: Crystal Ashley MRN: [id number] DOB: Apr 11, 1946 Today's Date: 06/10/2023   History of present illness 77 y.o. female presents to [facility] 06/02/23 with altered mental status. Admitted w/ sepsis 2/2 sacral decubitus cellulitis vs aspiration PNA. 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 Total A to sit EOB with heavy posterior support required, max A.pt did not initiate any rolling or movement with sup - sit/sit - sup transitions. Total A hand over hand to wash face without initation of any task or following commands. Pt requires being fed by staff. OT will continue to follow acutely to maximize level of function and safety      If plan is discharge home, recommend the following:  Two people to help with walking and/or transfers;A lot of help with bathing/dressing/bathroom   Equipment Recommendations  None recommended by OT    Recommendations for Other Services      Precautions / Restrictions Precautions Precautions: Fall Precaution Comments: DYS 1 with Nectar Thick Liquid Restrictions Weight Bearing Restrictions: No       Mobility Bed Mobility Overal bed mobility: Needs Assistance Bed Mobility: Supine to Sit, Sit to Supine Rolling: Total assist, +2 for physical assistance   Supine to sit: +2 for physical assistance Sit to supine: +2 for physical assistance   General bed mobility comments: pt did not initiate any rolling or movement with sup - sit/sit - sup transitions    Transfers                   General transfer comment: Unable at this time     Balance Overall balance assessment: Needs assistance Sitting-balance support: Feet supported, No upper extremity supported, Bilateral upper extremity supported Sitting balance-Leahy Scale: Zero Sitting balance - Comments: heavy posterior support  required, max A Postural control: Posterior lean, Right lateral lean                                 ADL either performed or assessed with clinical judgement   ADL Overall ADL's : Needs assistance/impaired                                       General ADL Comments: Dep/total A at bed level, total A hand over hand to wash face without initation of any task or following commands. Pt requires being fe dby staff    Extremity/Trunk Assessment Upper Extremity Assessment Upper Extremity Assessment: Generalized weakness;RUE deficits/detail;LUE deficits/detail RUE Deficits / Details: no purposeful AROM noted, arm swollen and stiff to attempted PROM.  Withdrawal to painful stimuli LUE Deficits / Details: no purposeful AROM noted, arm swollen and stiff to attempted PROM.  Withdrawals to painfur stimuli   Lower Extremity Assessment Lower Extremity Assessment: Defer to PT evaluation        Vision       Perception     Praxis      Cognition Arousal: Alert Behavior During Therapy: Flat affect Overall Cognitive Status: History of cognitive impairments - at baseline                                 General Comments: Non verbal with  hx of dementia. Followed command to squeeze hand, not initiating movements.        Exercises      Shoulder Instructions       General Comments      Pertinent Vitals/ Pain       Pain Assessment Pain Assessment: PAINAD Faces Pain Scale: No hurt Pain Intervention(s): Monitored during session  Home Living                                          Prior Functioning/Environment              Frequency  Min 1X/week        Progress Toward Goals  OT Goals(current goals can now be found in the care plan section)  Progress towards OT goals: OT to reassess next treatment     Plan      Co-evaluation                 AM-PAC OT "6 Clicks" Daily Activity     Outcome  Measure   Help from another person eating meals?: Total Help from another person taking care of personal grooming?: Total Help from another person toileting, which includes using toliet, bedpan, or urinal?: Total Help from another person bathing (including washing, rinsing, drying)?: Total Help from another person to put on and taking off regular upper body clothing?: Total Help from another person to put on and taking off regular lower body clothing?: Total 6 Click Score: 6    End of Session    OT Visit Diagnosis: Other symptoms and signs involving cognitive function;Muscle weakness (generalized) (M62.81)   Activity Tolerance Patient tolerated treatment well   Patient Left in bed;with call bell/phone within reach   Nurse Communication          Time: 0865-7846 OT Time Calculation (min): 18 min  Charges: OT General Charges $OT Visit: 1 Visit OT Treatments $Therapeutic Activity: 8-22 mins    Galen Manila 06/10/2023, 12:30 PM

## 2023-06-11 ENCOUNTER — Telehealth: Payer: Self-pay | Admitting: Family Medicine

## 2023-06-11 ENCOUNTER — Other Ambulatory Visit (HOSPITAL_COMMUNITY): Payer: Self-pay

## 2023-06-11 DIAGNOSIS — Z7189 Other specified counseling: Secondary | ICD-10-CM

## 2023-06-11 DIAGNOSIS — I482 Chronic atrial fibrillation, unspecified: Secondary | ICD-10-CM | POA: Diagnosis not present

## 2023-06-11 DIAGNOSIS — Z66 Do not resuscitate: Secondary | ICD-10-CM

## 2023-06-11 DIAGNOSIS — Z515 Encounter for palliative care: Secondary | ICD-10-CM

## 2023-06-11 DIAGNOSIS — F03C0 Unspecified dementia, severe, without behavioral disturbance, psychotic disturbance, mood disturbance, and anxiety: Secondary | ICD-10-CM

## 2023-06-11 LAB — GLUCOSE, CAPILLARY
Glucose-Capillary: 189 mg/dL — ABNORMAL HIGH (ref 70–99)
Glucose-Capillary: 201 mg/dL — ABNORMAL HIGH (ref 70–99)

## 2023-06-11 MED ORDER — METFORMIN HCL 1000 MG PO TABS
1000.0000 mg | ORAL_TABLET | Freq: Two times a day (BID) | ORAL | 1 refills | Status: DC
  Filled 2023-06-11: qty 60, 30d supply, fill #0
  Filled 2023-06-11: qty 60, 15d supply, fill #0

## 2023-06-11 MED ORDER — LINAGLIPTIN 5 MG PO TABS
5.0000 mg | ORAL_TABLET | Freq: Every day | ORAL | 1 refills | Status: DC
  Filled 2023-06-11: qty 30, 30d supply, fill #0

## 2023-06-11 NOTE — Telephone Encounter (Signed)
Clover Mealy, patient care manager, called to inform PCP that patient is currently in the hospital but is sent to d/c today. States they are able to send the nurse back out on Monday to resume care. States they don't have anyone to work in case she gets discharged home this weekend.   If needed, Darnelle can be reached @ [phone].

## 2023-06-11 NOTE — Telephone Encounter (Signed)
Caller was Archie Patten, Referral intake specialist from [employer] [employer]. States they are taking over palliative care for the patient. States if anything needed they can be reached @ [phone].

## 2023-06-11 NOTE — TOC Transition Note (Signed)
Transition of Care [facility]) - CM/SW Discharge Note   Patient Details  Name: Crystal Ashley MRN: [id number] Date of Birth: 1946/02/28  [facility] CM/SW Contact:  Durenda Guthrie, RN Phone Number: 06/11/2023, 10:34 AM   Clinical Narrative:    Case Manager confirmed with patient's son ambulance transport home. He is preparing for his mom to return home today. CM arranged [facility] transport for 2p pickup. No further needs identified. Medical Necessity form will be placed on chart, bedside RN updated.     Final next level of care: Home w Home Health Services Barriers to Discharge: No Barriers Identified   Patient Goals and CMS Choice CMS Medicare.gov Compare Post Acute Care list provided to:: Other (Comment Required) Choice offered to / list presented to : Adult Children  Discharge Placement                         Discharge Plan and Services Additional resources added to the After Visit Summary for     Discharge Planning Services: CM Consult Post Acute Care Choice: Resumption of Svcs/PTA Provider          DME Arranged: N/A         HH Arranged: RN, PT, Speech Therapy HH Agency: [facility] ([facility]) Date HH Agency Contacted: 06/08/23 Time HH Agency Contacted: 1040 Representative spoke with at [facility] Agency: Misty with [facility] [employer]  Social Determinants of Health (SDOH) Interventions SDOH Screenings   Food Insecurity: No Food Insecurity (06/03/2023)  Housing: Low Risk  (06/03/2023)  Transportation Needs: No Transportation Needs (06/03/2023)  Utilities: Not At Risk (06/03/2023)  Depression (PHQ2-9): High Risk (05/20/2023)  Financial Resource Strain: Low Risk  (06/26/2022)  Physical Activity: Inactive (06/26/2022)  Social Connections: Socially Isolated (06/26/2022)  Stress: No Stress Concern Present (06/26/2022)  Tobacco Use: Low Risk  (06/02/2023)     Readmission Risk Interventions     No data to display

## 2023-06-11 NOTE — Plan of Care (Signed)
Discharging home with son and home health.

## 2023-06-11 NOTE — Discharge Summary (Signed)
[facility]  Physician Discharge Summary   Patient ID: Crystal Ashley MRN: [id number] DOB/AGE: 1945-09-13 77 y.o.  Admit date: 06/02/2023 Discharge date: 06/11/2023    PCP: Ardith Dark, MD  DISCHARGE DIAGNOSES:    Sepsis due to cellulitis [facility])   Chronic atrial fibrillation with RVR (HCC)   AKI (acute kidney injury) (HCC)   Hypertension associated with diabetes (HCC)   Hypothyroidism   Hyperlipidemia associated with type 2 diabetes mellitus (HCC)   Uncontrolled type 2 diabetes mellitus with hyperglycemia, without long-term current use of insulin (HCC)   Hyponatremia   Hyperkalemia   Acute metabolic encephalopathy   Dysphagia   Severe dementia without behavioral disturbance, psychotic disturbance, mood disturbance, or anxiety (HCC)   RECOMMENDATIONS FOR OUTPATIENT FOLLOW UP: Minimize hospitalization.  Palliative care to follow at home.   Home Health: PT RN palliative care Equipment/Devices: None  CODE STATUS: DNR  DISCHARGE CONDITION: fair  Diet recommendation: Dysphagia 1  INITIAL HISTORY:  77 y.o. 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  Pt unable to provide history as she was non-verbal which is her baseline. Son provides hx over the phone. He is her caretaker and she also has day time aid. Pt is mostly bed-bound and has not ambulate in a year and a half but can feed self. Today she was not helping with transfer. Son usually puts a belt around her and she helps push up on bed rails but today was just slumped over. She also was very diaphoretic. Did not cut up her breakfast as she usually does. Appeared weak. Only ate 2 bites and appear to want to vomit.  Found to have dysphagia and aspiration PNA.  Stay complicated by hypotension from meds./poor PO intake.     HOSPITAL COURSE:   Sepsis due to sacral decubitus  cellulitis vs aspiration PNA -wounds healing-- dressings per [facility] -She was changed over to oral antibiotics and has completed course of antibiotics. Stable from an infectious disease standpoint.  WBC has improved.  She remains afebrile.  -wound care consulted: Prevalon boots to bilateral heels   Normocytic anemia/unspecified Significant drop in hemoglobin noted on 10/22 when her hemoglobin dropped down to 5.8.  No overt bleeding was noted.  Could have been erroneous reading since her hemoglobin increased to more than expected after 2 units of PRBC.  Subsequently remained stable.  Do not anticipate any further workup at this time.  Abnormal LFTs likely due to blood transfusion.  Do not suspect hemolysis.   AKI (acute kidney injury) (HCC) Appears to have resolved.   Chronic atrial fibrillation with RVR (HCC) Noted to be on metoprolol.  Anticoagulation currently on hold due to anemia.  Can be resumed.     Dysphagia -son reports difficulty with swallowing pills at home for several months -while receiving Lokelma solution in ED, pt appears to have aspirated and had oxygen desaturation down to mid-80s. Placed on oxygen supplementation briefly with improvement. By speech therapy.  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  Palliative care consulted for goals of care conversation.  Son would not want any feeding tubes.   Hypotension She was given IV fluids and albumin.  Improvement in blood pressure noted.  She is noted to be on metoprolol twice a day for atrial fibrillation.  Dose was decreased.  Blood pressure has stabilized.  Continue to monitor.   Acute metabolic encephalopathy Seems to be close  to baseline.   Hyperkalemia -resolved   Hyponatremia -Stable   Uncontrolled type 2 diabetes mellitus with hyperglycemia, without long-term current use of insulin (HCC) -recent A1C of 9.5.  Dose of metformin was increased at discharge.   Hyperlipidemia associated with type 2 diabetes  mellitus (HCC) -no longer on statin per cardiology as pt wanted to decrease medication burden   Hypothyroidism -Continue levothyroxine   Obesity Estimated body mass index is 34.23 kg/m as calculated from the following:   Height as of this encounter: 5' (1.524 m).   Weight as of this encounter: 79.5 kg.  Stable for discharge home today.  PERTINENT LABS:  The results of significant diagnostics from this hospitalization (including imaging, microbiology, ancillary and laboratory) are listed below for reference.     Labs:   Basic Metabolic Panel: Recent Labs  Lab 06/06/23 0444 06/08/23 1503 06/09/23 0617 06/10/23 0518  NA 136 134* 133* 134*  K 3.7 4.2 3.9 3.7  CL 100 102 101 104  CO2 25 21* 22 22  GLUCOSE 167* 158* 219* 191*  BUN 10 17 14 13   CREATININE 0.91 1.14* 1.03* 0.90  CALCIUM 8.6* 8.2* 8.1* 8.2*   Liver Function Tests: Recent Labs  Lab 06/09/23 0617 06/10/23 0518  AST 24 90*  ALT 16 56*  ALKPHOS 72 217*  BILITOT 1.6* 2.4*  PROT 5.4* 5.1*  ALBUMIN 2.5* 2.2*    CBC: Recent Labs  Lab 06/06/23 0444 06/08/23 1734 06/08/23 2028 06/09/23 0617 06/10/23 0518  WBC 11.2* 23.9* 21.6* 17.0* 15.5*  HGB 9.0* 6.0* 5.8* 10.0* 9.8*  HCT 28.7* 19.5* 17.7* 30.0* 29.3*  MCV 95.7 99.0 97.8 87.5 88.3  PLT 273 309 262 202 222    BNP: BNP (last 3 results) Recent Labs    06/02/23 1105  BNP 88.5    CBG: Recent Labs  Lab 06/10/23 1221 06/10/23 1610 06/10/23 2106 06/11/23 0823 06/11/23 1310  GLUCAP 175* 166* 191* 189* 201*     IMAGING STUDIES DG CHEST PORT 1 VIEW  Result Date: 06/03/2023 CLINICAL DATA:  Altered mental status, aspiration into airway EXAM: PORTABLE CHEST 1 VIEW COMPARISON:  Radiographs 06/02/2023 and same day swallowing study FINDINGS: Stable cardiomediastinal contours. Aortic atherosclerotic calcification. Hyperdense reticular opacities in the bilateral lower lungs is compatible with aspirated contrast. Contrast is present within the  stomach. No focal consolidation, pleural effusion, or pneumothorax. Elevated right hemidiaphragm. Thoracic fusion hardware. IMPRESSION: Hyperdense reticular opacities in the bilateral lower lungs is compatible with aspirated contrast. Electronically Signed   By: Minerva Fester M.D.   On: 06/03/2023 15:28   DG Swallowing Func-Speech Pathology  Result Date: 06/03/2023 Table formatting from the original result was not included. Modified Barium Swallow Study Patient Details Name: Shaunie Beatty MRN: [id number] Date of Birth: 03/13/1946 Today's Date: 06/03/2023 HPI/PMH: HPI: Felcia Kartina Sweetman is a 77 y.o. female who presented with altered mental status. CXR and Head CT 10/16 without acute findings. Per chart review pt is mostly bedbound, but was feeding herself and cutting up her own food pta. Pt with MBS in Oct of 2021 with concern for esophageal dysphagia and reflux to pharynx; recs for reg diet with f/u for esophageal issues as needed. 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 With consecutive sips of thin liquids, pt presents with timing deficits which cause the head of the  to baseline.   Hyperkalemia -resolved   Hyponatremia -Stable   Uncontrolled type 2 diabetes mellitus with hyperglycemia, without long-term current use of insulin (HCC) -recent A1C of 9.5.  Dose of metformin was increased at discharge.   Hyperlipidemia associated with type 2 diabetes  mellitus (HCC) -no longer on statin per cardiology as pt wanted to decrease medication burden   Hypothyroidism -Continue levothyroxine   Obesity Estimated body mass index is 34.23 kg/m as calculated from the following:   Height as of this encounter: 5' (1.524 m).   Weight as of this encounter: 79.5 kg.  Stable for discharge home today.  PERTINENT LABS:  The results of significant diagnostics from this hospitalization (including imaging, microbiology, ancillary and laboratory) are listed below for reference.     Labs:   Basic Metabolic Panel: Recent Labs  Lab 06/06/23 0444 06/08/23 1503 06/09/23 0617 06/10/23 0518  NA 136 134* 133* 134*  K 3.7 4.2 3.9 3.7  CL 100 102 101 104  CO2 25 21* 22 22  GLUCOSE 167* 158* 219* 191*  BUN 10 17 14 13   CREATININE 0.91 1.14* 1.03* 0.90  CALCIUM 8.6* 8.2* 8.1* 8.2*   Liver Function Tests: Recent Labs  Lab 06/09/23 0617 06/10/23 0518  AST 24 90*  ALT 16 56*  ALKPHOS 72 217*  BILITOT 1.6* 2.4*  PROT 5.4* 5.1*  ALBUMIN 2.5* 2.2*    CBC: Recent Labs  Lab 06/06/23 0444 06/08/23 1734 06/08/23 2028 06/09/23 0617 06/10/23 0518  WBC 11.2* 23.9* 21.6* 17.0* 15.5*  HGB 9.0* 6.0* 5.8* 10.0* 9.8*  HCT 28.7* 19.5* 17.7* 30.0* 29.3*  MCV 95.7 99.0 97.8 87.5 88.3  PLT 273 309 262 202 222    BNP: BNP (last 3 results) Recent Labs    06/02/23 1105  BNP 88.5    CBG: Recent Labs  Lab 06/10/23 1221 06/10/23 1610 06/10/23 2106 06/11/23 0823 06/11/23 1310  GLUCAP 175* 166* 191* 189* 201*     IMAGING STUDIES DG CHEST PORT 1 VIEW  Result Date: 06/03/2023 CLINICAL DATA:  Altered mental status, aspiration into airway EXAM: PORTABLE CHEST 1 VIEW COMPARISON:  Radiographs 06/02/2023 and same day swallowing study FINDINGS: Stable cardiomediastinal contours. Aortic atherosclerotic calcification. Hyperdense reticular opacities in the bilateral lower lungs is compatible with aspirated contrast. Contrast is present within the  stomach. No focal consolidation, pleural effusion, or pneumothorax. Elevated right hemidiaphragm. Thoracic fusion hardware. IMPRESSION: Hyperdense reticular opacities in the bilateral lower lungs is compatible with aspirated contrast. Electronically Signed   By: Minerva Fester M.D.   On: 06/03/2023 15:28   DG Swallowing Func-Speech Pathology  Result Date: 06/03/2023 Table formatting from the original result was not included. Modified Barium Swallow Study Patient Details Name: Shaunie Beatty MRN: [id number] Date of Birth: 03/13/1946 Today's Date: 06/03/2023 HPI/PMH: HPI: Felcia Kartina Sweetman is a 77 y.o. female who presented with altered mental status. CXR and Head CT 10/16 without acute findings. Per chart review pt is mostly bedbound, but was feeding herself and cutting up her own food pta. Pt with MBS in Oct of 2021 with concern for esophageal dysphagia and reflux to pharynx; recs for reg diet with f/u for esophageal issues as needed. 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 With consecutive sips of thin liquids, pt presents with timing deficits which cause the head of the  [facility]  Physician Discharge Summary   Patient ID: Crystal Ashley MRN: [id number] DOB/AGE: 1945-09-13 77 y.o.  Admit date: 06/02/2023 Discharge date: 06/11/2023    PCP: Ardith Dark, MD  DISCHARGE DIAGNOSES:    Sepsis due to cellulitis [facility])   Chronic atrial fibrillation with RVR (HCC)   AKI (acute kidney injury) (HCC)   Hypertension associated with diabetes (HCC)   Hypothyroidism   Hyperlipidemia associated with type 2 diabetes mellitus (HCC)   Uncontrolled type 2 diabetes mellitus with hyperglycemia, without long-term current use of insulin (HCC)   Hyponatremia   Hyperkalemia   Acute metabolic encephalopathy   Dysphagia   Severe dementia without behavioral disturbance, psychotic disturbance, mood disturbance, or anxiety (HCC)   RECOMMENDATIONS FOR OUTPATIENT FOLLOW UP: Minimize hospitalization.  Palliative care to follow at home.   Home Health: PT RN palliative care Equipment/Devices: None  CODE STATUS: DNR  DISCHARGE CONDITION: fair  Diet recommendation: Dysphagia 1  INITIAL HISTORY:  77 y.o. 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  Pt unable to provide history as she was non-verbal which is her baseline. Son provides hx over the phone. He is her caretaker and she also has day time aid. Pt is mostly bed-bound and has not ambulate in a year and a half but can feed self. Today she was not helping with transfer. Son usually puts a belt around her and she helps push up on bed rails but today was just slumped over. She also was very diaphoretic. Did not cut up her breakfast as she usually does. Appeared weak. Only ate 2 bites and appear to want to vomit.  Found to have dysphagia and aspiration PNA.  Stay complicated by hypotension from meds./poor PO intake.     HOSPITAL COURSE:   Sepsis due to sacral decubitus  cellulitis vs aspiration PNA -wounds healing-- dressings per [facility] -She was changed over to oral antibiotics and has completed course of antibiotics. Stable from an infectious disease standpoint.  WBC has improved.  She remains afebrile.  -wound care consulted: Prevalon boots to bilateral heels   Normocytic anemia/unspecified Significant drop in hemoglobin noted on 10/22 when her hemoglobin dropped down to 5.8.  No overt bleeding was noted.  Could have been erroneous reading since her hemoglobin increased to more than expected after 2 units of PRBC.  Subsequently remained stable.  Do not anticipate any further workup at this time.  Abnormal LFTs likely due to blood transfusion.  Do not suspect hemolysis.   AKI (acute kidney injury) (HCC) Appears to have resolved.   Chronic atrial fibrillation with RVR (HCC) Noted to be on metoprolol.  Anticoagulation currently on hold due to anemia.  Can be resumed.     Dysphagia -son reports difficulty with swallowing pills at home for several months -while receiving Lokelma solution in ED, pt appears to have aspirated and had oxygen desaturation down to mid-80s. Placed on oxygen supplementation briefly with improvement. By speech therapy.  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  Palliative care consulted for goals of care conversation.  Son would not want any feeding tubes.   Hypotension She was given IV fluids and albumin.  Improvement in blood pressure noted.  She is noted to be on metoprolol twice a day for atrial fibrillation.  Dose was decreased.  Blood pressure has stabilized.  Continue to monitor.   Acute metabolic encephalopathy Seems to be close  [facility]  Physician Discharge Summary   Patient ID: Crystal Ashley MRN: [id number] DOB/AGE: 1945-09-13 77 y.o.  Admit date: 06/02/2023 Discharge date: 06/11/2023    PCP: Ardith Dark, MD  DISCHARGE DIAGNOSES:    Sepsis due to cellulitis [facility])   Chronic atrial fibrillation with RVR (HCC)   AKI (acute kidney injury) (HCC)   Hypertension associated with diabetes (HCC)   Hypothyroidism   Hyperlipidemia associated with type 2 diabetes mellitus (HCC)   Uncontrolled type 2 diabetes mellitus with hyperglycemia, without long-term current use of insulin (HCC)   Hyponatremia   Hyperkalemia   Acute metabolic encephalopathy   Dysphagia   Severe dementia without behavioral disturbance, psychotic disturbance, mood disturbance, or anxiety (HCC)   RECOMMENDATIONS FOR OUTPATIENT FOLLOW UP: Minimize hospitalization.  Palliative care to follow at home.   Home Health: PT RN palliative care Equipment/Devices: None  CODE STATUS: DNR  DISCHARGE CONDITION: fair  Diet recommendation: Dysphagia 1  INITIAL HISTORY:  77 y.o. 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  Pt unable to provide history as she was non-verbal which is her baseline. Son provides hx over the phone. He is her caretaker and she also has day time aid. Pt is mostly bed-bound and has not ambulate in a year and a half but can feed self. Today she was not helping with transfer. Son usually puts a belt around her and she helps push up on bed rails but today was just slumped over. She also was very diaphoretic. Did not cut up her breakfast as she usually does. Appeared weak. Only ate 2 bites and appear to want to vomit.  Found to have dysphagia and aspiration PNA.  Stay complicated by hypotension from meds./poor PO intake.     HOSPITAL COURSE:   Sepsis due to sacral decubitus  cellulitis vs aspiration PNA -wounds healing-- dressings per [facility] -She was changed over to oral antibiotics and has completed course of antibiotics. Stable from an infectious disease standpoint.  WBC has improved.  She remains afebrile.  -wound care consulted: Prevalon boots to bilateral heels   Normocytic anemia/unspecified Significant drop in hemoglobin noted on 10/22 when her hemoglobin dropped down to 5.8.  No overt bleeding was noted.  Could have been erroneous reading since her hemoglobin increased to more than expected after 2 units of PRBC.  Subsequently remained stable.  Do not anticipate any further workup at this time.  Abnormal LFTs likely due to blood transfusion.  Do not suspect hemolysis.   AKI (acute kidney injury) (HCC) Appears to have resolved.   Chronic atrial fibrillation with RVR (HCC) Noted to be on metoprolol.  Anticoagulation currently on hold due to anemia.  Can be resumed.     Dysphagia -son reports difficulty with swallowing pills at home for several months -while receiving Lokelma solution in ED, pt appears to have aspirated and had oxygen desaturation down to mid-80s. Placed on oxygen supplementation briefly with improvement. By speech therapy.  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  Palliative care consulted for goals of care conversation.  Son would not want any feeding tubes.   Hypotension She was given IV fluids and albumin.  Improvement in blood pressure noted.  She is noted to be on metoprolol twice a day for atrial fibrillation.  Dose was decreased.  Blood pressure has stabilized.  Continue to monitor.   Acute metabolic encephalopathy Seems to be close  to baseline.   Hyperkalemia -resolved   Hyponatremia -Stable   Uncontrolled type 2 diabetes mellitus with hyperglycemia, without long-term current use of insulin (HCC) -recent A1C of 9.5.  Dose of metformin was increased at discharge.   Hyperlipidemia associated with type 2 diabetes  mellitus (HCC) -no longer on statin per cardiology as pt wanted to decrease medication burden   Hypothyroidism -Continue levothyroxine   Obesity Estimated body mass index is 34.23 kg/m as calculated from the following:   Height as of this encounter: 5' (1.524 m).   Weight as of this encounter: 79.5 kg.  Stable for discharge home today.  PERTINENT LABS:  The results of significant diagnostics from this hospitalization (including imaging, microbiology, ancillary and laboratory) are listed below for reference.     Labs:   Basic Metabolic Panel: Recent Labs  Lab 06/06/23 0444 06/08/23 1503 06/09/23 0617 06/10/23 0518  NA 136 134* 133* 134*  K 3.7 4.2 3.9 3.7  CL 100 102 101 104  CO2 25 21* 22 22  GLUCOSE 167* 158* 219* 191*  BUN 10 17 14 13   CREATININE 0.91 1.14* 1.03* 0.90  CALCIUM 8.6* 8.2* 8.1* 8.2*   Liver Function Tests: Recent Labs  Lab 06/09/23 0617 06/10/23 0518  AST 24 90*  ALT 16 56*  ALKPHOS 72 217*  BILITOT 1.6* 2.4*  PROT 5.4* 5.1*  ALBUMIN 2.5* 2.2*    CBC: Recent Labs  Lab 06/06/23 0444 06/08/23 1734 06/08/23 2028 06/09/23 0617 06/10/23 0518  WBC 11.2* 23.9* 21.6* 17.0* 15.5*  HGB 9.0* 6.0* 5.8* 10.0* 9.8*  HCT 28.7* 19.5* 17.7* 30.0* 29.3*  MCV 95.7 99.0 97.8 87.5 88.3  PLT 273 309 262 202 222    BNP: BNP (last 3 results) Recent Labs    06/02/23 1105  BNP 88.5    CBG: Recent Labs  Lab 06/10/23 1221 06/10/23 1610 06/10/23 2106 06/11/23 0823 06/11/23 1310  GLUCAP 175* 166* 191* 189* 201*     IMAGING STUDIES DG CHEST PORT 1 VIEW  Result Date: 06/03/2023 CLINICAL DATA:  Altered mental status, aspiration into airway EXAM: PORTABLE CHEST 1 VIEW COMPARISON:  Radiographs 06/02/2023 and same day swallowing study FINDINGS: Stable cardiomediastinal contours. Aortic atherosclerotic calcification. Hyperdense reticular opacities in the bilateral lower lungs is compatible with aspirated contrast. Contrast is present within the  stomach. No focal consolidation, pleural effusion, or pneumothorax. Elevated right hemidiaphragm. Thoracic fusion hardware. IMPRESSION: Hyperdense reticular opacities in the bilateral lower lungs is compatible with aspirated contrast. Electronically Signed   By: Minerva Fester M.D.   On: 06/03/2023 15:28   DG Swallowing Func-Speech Pathology  Result Date: 06/03/2023 Table formatting from the original result was not included. Modified Barium Swallow Study Patient Details Name: Shaunie Beatty MRN: [id number] Date of Birth: 03/13/1946 Today's Date: 06/03/2023 HPI/PMH: HPI: Felcia Kartina Sweetman is a 77 y.o. female who presented with altered mental status. CXR and Head CT 10/16 without acute findings. Per chart review pt is mostly bedbound, but was feeding herself and cutting up her own food pta. Pt with MBS in Oct of 2021 with concern for esophageal dysphagia and reflux to pharynx; recs for reg diet with f/u for esophageal issues as needed. 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 With consecutive sips of thin liquids, pt presents with timing deficits which cause the head of the  to baseline.   Hyperkalemia -resolved   Hyponatremia -Stable   Uncontrolled type 2 diabetes mellitus with hyperglycemia, without long-term current use of insulin (HCC) -recent A1C of 9.5.  Dose of metformin was increased at discharge.   Hyperlipidemia associated with type 2 diabetes  mellitus (HCC) -no longer on statin per cardiology as pt wanted to decrease medication burden   Hypothyroidism -Continue levothyroxine   Obesity Estimated body mass index is 34.23 kg/m as calculated from the following:   Height as of this encounter: 5' (1.524 m).   Weight as of this encounter: 79.5 kg.  Stable for discharge home today.  PERTINENT LABS:  The results of significant diagnostics from this hospitalization (including imaging, microbiology, ancillary and laboratory) are listed below for reference.     Labs:   Basic Metabolic Panel: Recent Labs  Lab 06/06/23 0444 06/08/23 1503 06/09/23 0617 06/10/23 0518  NA 136 134* 133* 134*  K 3.7 4.2 3.9 3.7  CL 100 102 101 104  CO2 25 21* 22 22  GLUCOSE 167* 158* 219* 191*  BUN 10 17 14 13   CREATININE 0.91 1.14* 1.03* 0.90  CALCIUM 8.6* 8.2* 8.1* 8.2*   Liver Function Tests: Recent Labs  Lab 06/09/23 0617 06/10/23 0518  AST 24 90*  ALT 16 56*  ALKPHOS 72 217*  BILITOT 1.6* 2.4*  PROT 5.4* 5.1*  ALBUMIN 2.5* 2.2*    CBC: Recent Labs  Lab 06/06/23 0444 06/08/23 1734 06/08/23 2028 06/09/23 0617 06/10/23 0518  WBC 11.2* 23.9* 21.6* 17.0* 15.5*  HGB 9.0* 6.0* 5.8* 10.0* 9.8*  HCT 28.7* 19.5* 17.7* 30.0* 29.3*  MCV 95.7 99.0 97.8 87.5 88.3  PLT 273 309 262 202 222    BNP: BNP (last 3 results) Recent Labs    06/02/23 1105  BNP 88.5    CBG: Recent Labs  Lab 06/10/23 1221 06/10/23 1610 06/10/23 2106 06/11/23 0823 06/11/23 1310  GLUCAP 175* 166* 191* 189* 201*     IMAGING STUDIES DG CHEST PORT 1 VIEW  Result Date: 06/03/2023 CLINICAL DATA:  Altered mental status, aspiration into airway EXAM: PORTABLE CHEST 1 VIEW COMPARISON:  Radiographs 06/02/2023 and same day swallowing study FINDINGS: Stable cardiomediastinal contours. Aortic atherosclerotic calcification. Hyperdense reticular opacities in the bilateral lower lungs is compatible with aspirated contrast. Contrast is present within the  stomach. No focal consolidation, pleural effusion, or pneumothorax. Elevated right hemidiaphragm. Thoracic fusion hardware. IMPRESSION: Hyperdense reticular opacities in the bilateral lower lungs is compatible with aspirated contrast. Electronically Signed   By: Minerva Fester M.D.   On: 06/03/2023 15:28   DG Swallowing Func-Speech Pathology  Result Date: 06/03/2023 Table formatting from the original result was not included. Modified Barium Swallow Study Patient Details Name: Shaunie Beatty MRN: [id number] Date of Birth: 03/13/1946 Today's Date: 06/03/2023 HPI/PMH: HPI: Felcia Kartina Sweetman is a 77 y.o. female who presented with altered mental status. CXR and Head CT 10/16 without acute findings. Per chart review pt is mostly bedbound, but was feeding herself and cutting up her own food pta. Pt with MBS in Oct of 2021 with concern for esophageal dysphagia and reflux to pharynx; recs for reg diet with f/u for esophageal issues as needed. 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 With consecutive sips of thin liquids, pt presents with timing deficits which cause the head of the

## 2023-06-12 ENCOUNTER — Other Ambulatory Visit (HOSPITAL_COMMUNITY): Payer: Self-pay

## 2023-06-14 ENCOUNTER — Telehealth: Payer: Self-pay

## 2023-06-14 DIAGNOSIS — L89312 Pressure ulcer of right buttock, stage 2: Secondary | ICD-10-CM | POA: Diagnosis not present

## 2023-06-14 DIAGNOSIS — E1159 Type 2 diabetes mellitus with other circulatory complications: Secondary | ICD-10-CM | POA: Diagnosis not present

## 2023-06-14 DIAGNOSIS — L89322 Pressure ulcer of left buttock, stage 2: Secondary | ICD-10-CM | POA: Diagnosis not present

## 2023-06-14 DIAGNOSIS — I152 Hypertension secondary to endocrine disorders: Secondary | ICD-10-CM | POA: Diagnosis not present

## 2023-06-14 DIAGNOSIS — E785 Hyperlipidemia, unspecified: Secondary | ICD-10-CM | POA: Diagnosis not present

## 2023-06-14 DIAGNOSIS — E1169 Type 2 diabetes mellitus with other specified complication: Secondary | ICD-10-CM | POA: Diagnosis not present

## 2023-06-14 NOTE — Transitions of Care (Post Inpatient/ED Visit) (Signed)
06/14/2023  Name: Crystal Ashley MRN: [id number] DOB: [birth year]  Today's TOC FU Call Status: Today's TOC FU Call Status:: Successful TOC FU Call Completed TOC FU Call Complete Date: 06/14/23 (Call completed with son.) Patient's Name and Date of Birth confirmed.  Transition Care Management Follow-up Telephone Call Date of Discharge: 06/11/23 Discharge Facility: [facility]) Type of Discharge: Inpatient Admission Primary Inpatient Discharge Diagnosis:: "AMS" How have you been since you were released from the hospital?: Better (Son states pt doing better-no new issues or concerns-she is eating-pureed diet, cbg 104 this AM, LBM today) Any questions or concerns?: No  Items Reviewed: Did you receive and understand the discharge instructions provided?: Yes Medications obtained,verified, and reconciled?: Yes (Medications Reviewed) Any new allergies since your discharge?: No Dietary orders reviewed?: Yes Type of Diet Ordered:: pureed-low salt/heart healthy/car modified Do you have support at home?: Yes People in Home: child(ren), adult Name of Support/Comfort Primary Source: Son-Prashant, also has paid caregiver in the home a few hrs per day  Medications Reviewed Today: Medications Reviewed Today     Reviewed by Charlyn Minerva, RN (Registered Nurse) on 06/14/23 at 1114  Med List Status: <None>   Medication Order Taking? Sig Documenting Provider Last Dose Status Informant  blood glucose meter kit and supplies [id number] Yes Check glucose 3 times daily or PRN E11.9 Ardith Dark, MD Taking Active Child, Pharmacy Records  dorzolamide-timolol (COSOPT) 22.3-6.8 MG/ML ophthalmic solution [id number] Yes Place 1 drop into both eyes 2 (two) times daily. [provider] Taking Active Child, Pharmacy Records  ferrous sulfate (FER-IN-SOL) 75 (15 Fe) MG/ML SOLN [id number] Yes Take 4 mLs by mouth daily. [provider] Taking Active Child, Pharmacy Records   furosemide (LASIX) 40 MG tablet [id number] Yes TAKE 1 TABLET EVERY DAY. CHANGE TO 1 TABLET EVERY OTHER DAY WHEN DOWN 10 POUNDS AS DIRECTED Chandrasekhar, Mahesh A, MD Taking Active Child, Pharmacy Records  Glucose Blood (BLOOD GLUCOSE TEST STRIPS) STRP [id number] Yes 1 each by Other route 2 (two) times daily. USE ONE TEST STRIP TWICE DAILY TO CHECK BLOOD SUGAR E11.9 Ardith Dark, MD Taking Active Child, Pharmacy Records  latanoprost (XALATAN) 0.005 % ophthalmic solution [id number] Yes 1 drop at bedtime. [provider] Taking Active Child, Pharmacy Records  levothyroxine (SYNTHROID) 75 MCG tablet [id number] Yes TAKE 1 TABLET EVERY DAY BEFORE BREAKFAST Ardith Dark, MD Taking Active Child, Pharmacy Records  metFORMIN (GLUCOPHAGE) 1000 MG tablet [id number] Yes Take 1 tablet (1,000 mg total) by mouth 2 (two) times daily with a meal. Osvaldo Shipper, MD Taking Active   metoprolol succinate (TOPROL-XL) 25 MG 24 hr tablet [id number] Yes TAKE 1 TABLET EVERY DAY (CALL [phone] TO SCHEDULE APPOINTMENT FOR REFILLS)  Patient taking differently: 25 mg daily.   Christell Constant, MD Taking Active Child, Pharmacy Records  pantoprazole (PROTONIX) 40 MG tablet [id number] Yes TAKE 1 TABLET EVERY DAY (PLEASE REFILL WITH PRIMARY CARE PROVIDER FOR ANYMORE REFILLS) Ardith Dark, MD Taking Active Child, Pharmacy Records  Potassium Chloride 10 % SOLN [id number] Yes Take 10 mEq by mouth daily. Extended release; Use 7.12ml of solution with 2 oz of water [provider] Taking Active Child, Pharmacy Records  spironolactone (ALDACTONE) 25 MG tablet [id number] Yes Take 1 tablet (25 mg total) by mouth daily. Christell Constant, MD Taking Active Child, Pharmacy Records  vitamin B-12 (CYANOCOBALAMIN) 500 MCG tablet [id number] Yes Take 500 mcg by mouth daily.  [provider] Taking Active Child, Pharmacy Records  XARELTO 20 MG  TABS tablet [id number] Yes TAKE 1 TABLET EVERY DAY WITH BREAKFAST   Patient taking differently: 20 mg daily with supper.   Christell Constant, MD Taking Active Child, Pharmacy Records            Home Care and Equipment/Supplies: Were Home Health Services Ordered?: Yes Name of Home Health Agency:: [facility] Has Agency set up a time to come to your home?: Yes First Home Health Visit Date: 06/14/23 (Son states they have called and scheduled home visit for today) Any new equipment or medical supplies ordered?: No  Functional Questionnaire: Do you need assistance with bathing/showering or dressing?: Yes (pt bedbound and total care-family and caregiver assist with all ADLs/IADLs) Do you need assistance with meal preparation?: Yes Do you need assistance with eating?: Yes Do you have difficulty maintaining continence: Yes (wear diapers) Do you need assistance with getting out of bed/getting out of a chair/moving?: Yes Do you have difficulty managing or taking your medications?: Yes  Follow up appointments reviewed: PCP Follow-up appointment confirmed?: No (Son voices that pt is bedbound-too difficulty to get her out of the home-PCP aware of this-being followed by Palliative Care-appt with them on 06/16/23-will call office as needed) MD Provider Line Number:[phone] Given: No Specialist Hospital Follow-up appointment confirmed?: NA Do you need transportation to your follow-up appointment?: No Do you understand care options if your condition(s) worsen?: Yes-patient verbalized understanding  SDOH Interventions Today    Flowsheet Row Most Recent Value  SDOH Interventions   Food Insecurity Interventions Intervention Not Indicated  Transportation Interventions Intervention Not Indicated         Antionette Fairy, RN,BSN,CCM RN Care Manager Transitions of Care  [facility]  Direct Phone: [phone] Toll Free: [phone] Fax: [phone]

## 2023-06-15 NOTE — Telephone Encounter (Signed)
Fyi.

## 2023-06-16 DIAGNOSIS — L89322 Pressure ulcer of left buttock, stage 2: Secondary | ICD-10-CM | POA: Diagnosis not present

## 2023-06-16 DIAGNOSIS — I152 Hypertension secondary to endocrine disorders: Secondary | ICD-10-CM | POA: Diagnosis not present

## 2023-06-16 DIAGNOSIS — E1169 Type 2 diabetes mellitus with other specified complication: Secondary | ICD-10-CM | POA: Diagnosis not present

## 2023-06-16 DIAGNOSIS — E1159 Type 2 diabetes mellitus with other circulatory complications: Secondary | ICD-10-CM | POA: Diagnosis not present

## 2023-06-16 DIAGNOSIS — L89312 Pressure ulcer of right buttock, stage 2: Secondary | ICD-10-CM | POA: Diagnosis not present

## 2023-06-16 DIAGNOSIS — E785 Hyperlipidemia, unspecified: Secondary | ICD-10-CM | POA: Diagnosis not present

## 2023-06-17 DIAGNOSIS — L89322 Pressure ulcer of left buttock, stage 2: Secondary | ICD-10-CM | POA: Diagnosis not present

## 2023-06-17 DIAGNOSIS — I152 Hypertension secondary to endocrine disorders: Secondary | ICD-10-CM | POA: Diagnosis not present

## 2023-06-17 DIAGNOSIS — E1169 Type 2 diabetes mellitus with other specified complication: Secondary | ICD-10-CM | POA: Diagnosis not present

## 2023-06-17 DIAGNOSIS — E1159 Type 2 diabetes mellitus with other circulatory complications: Secondary | ICD-10-CM | POA: Diagnosis not present

## 2023-06-17 DIAGNOSIS — L89312 Pressure ulcer of right buttock, stage 2: Secondary | ICD-10-CM | POA: Diagnosis not present

## 2023-06-17 DIAGNOSIS — E785 Hyperlipidemia, unspecified: Secondary | ICD-10-CM | POA: Diagnosis not present

## 2023-06-18 ENCOUNTER — Telehealth: Payer: Self-pay | Admitting: Family Medicine

## 2023-06-18 NOTE — Telephone Encounter (Signed)
See note

## 2023-06-18 NOTE — Telephone Encounter (Signed)
Caller called stating she received notification from patient's family that patient has passed away. States she will be sending over the required paperwork.

## 2023-06-18 NOTE — Telephone Encounter (Signed)
Noted. We will complete that certificate today.

## 2023-06-18 DEATH — deceased

## 2023-06-19 ENCOUNTER — Encounter: Payer: Self-pay | Admitting: Family Medicine

## 2023-06-21 NOTE — Telephone Encounter (Signed)
Spoke with patient son, PCP off the office till 01-Jul-2023  Time of death reported at 10 when is not specific time recorded Mr Crystal Ashley stated for insurance purposes requested time change   Please advise

## 2023-06-23 NOTE — Telephone Encounter (Signed)
Patient son notified

## 2023-06-23 NOTE — Telephone Encounter (Signed)
I have addended the death certificate.  Katina Degree. Jimmey Ralph, MD 06/23/2023 10:45 AM

## 2023-07-09 ENCOUNTER — Ambulatory Visit: Payer: Medicare Other | Admitting: Internal Medicine

## 2023-08-27 ENCOUNTER — Ambulatory Visit: Payer: Medicare Other | Admitting: Family Medicine
